# Patient Record
Sex: Male | Born: 1935 | Race: White | Hispanic: No | Marital: Married | Smoking: Former smoker
Health system: Southern US, Community
[De-identification: ages and names within clinical notes are randomized; demographics above are authoritative.]

## PROBLEM LIST (undated history)

## (undated) DIAGNOSIS — G8929 Other chronic pain: Secondary | ICD-10-CM

## (undated) DIAGNOSIS — K449 Diaphragmatic hernia without obstruction or gangrene: Secondary | ICD-10-CM

## (undated) DIAGNOSIS — I2510 Atherosclerotic heart disease of native coronary artery without angina pectoris: Secondary | ICD-10-CM

## (undated) DIAGNOSIS — K5900 Constipation, unspecified: Secondary | ICD-10-CM

## (undated) DIAGNOSIS — R001 Bradycardia, unspecified: Secondary | ICD-10-CM

## (undated) DIAGNOSIS — M810 Age-related osteoporosis without current pathological fracture: Secondary | ICD-10-CM

## (undated) DIAGNOSIS — M109 Gout, unspecified: Secondary | ICD-10-CM

## (undated) DIAGNOSIS — T8859XA Other complications of anesthesia, initial encounter: Secondary | ICD-10-CM

## (undated) DIAGNOSIS — IMO0001 Reserved for inherently not codable concepts without codable children: Secondary | ICD-10-CM

## (undated) DIAGNOSIS — Z9889 Other specified postprocedural states: Secondary | ICD-10-CM

## (undated) DIAGNOSIS — Z9841 Cataract extraction status, right eye: Secondary | ICD-10-CM

## (undated) DIAGNOSIS — K5520 Angiodysplasia of colon without hemorrhage: Secondary | ICD-10-CM

## (undated) DIAGNOSIS — K635 Polyp of colon: Secondary | ICD-10-CM

## (undated) DIAGNOSIS — I779 Disorder of arteries and arterioles, unspecified: Secondary | ICD-10-CM

## (undated) DIAGNOSIS — I10 Essential (primary) hypertension: Secondary | ICD-10-CM

## (undated) DIAGNOSIS — R296 Repeated falls: Secondary | ICD-10-CM

## (undated) DIAGNOSIS — M4306 Spondylolysis, lumbar region: Secondary | ICD-10-CM

## (undated) DIAGNOSIS — D500 Iron deficiency anemia secondary to blood loss (chronic): Secondary | ICD-10-CM

## (undated) DIAGNOSIS — I6789 Other cerebrovascular disease: Secondary | ICD-10-CM

## (undated) DIAGNOSIS — I480 Paroxysmal atrial fibrillation: Secondary | ICD-10-CM

## (undated) DIAGNOSIS — M1990 Unspecified osteoarthritis, unspecified site: Secondary | ICD-10-CM

## (undated) DIAGNOSIS — K5790 Diverticulosis of intestine, part unspecified, without perforation or abscess without bleeding: Secondary | ICD-10-CM

## (undated) DIAGNOSIS — I442 Atrioventricular block, complete: Secondary | ICD-10-CM

## (undated) DIAGNOSIS — M419 Scoliosis, unspecified: Secondary | ICD-10-CM

## (undated) DIAGNOSIS — N184 Chronic kidney disease, stage 4 (severe): Secondary | ICD-10-CM

## (undated) DIAGNOSIS — M720 Palmar fascial fibromatosis [Dupuytren]: Secondary | ICD-10-CM

## (undated) DIAGNOSIS — I951 Orthostatic hypotension: Secondary | ICD-10-CM

## (undated) DIAGNOSIS — N411 Chronic prostatitis: Secondary | ICD-10-CM

## (undated) DIAGNOSIS — I700 Atherosclerosis of aorta: Secondary | ICD-10-CM

## (undated) DIAGNOSIS — Z9842 Cataract extraction status, left eye: Secondary | ICD-10-CM

## (undated) DIAGNOSIS — I739 Peripheral vascular disease, unspecified: Secondary | ICD-10-CM

## (undated) DIAGNOSIS — H5440 Blindness, one eye, unspecified eye: Secondary | ICD-10-CM

## (undated) DIAGNOSIS — R768 Other specified abnormal immunological findings in serum: Secondary | ICD-10-CM

## (undated) DIAGNOSIS — I5032 Chronic diastolic (congestive) heart failure: Secondary | ICD-10-CM

## (undated) DIAGNOSIS — M5135 Other intervertebral disc degeneration, thoracolumbar region: Secondary | ICD-10-CM

## (undated) DIAGNOSIS — S0300XA Dislocation of jaw, unspecified side, initial encounter: Secondary | ICD-10-CM

## (undated) DIAGNOSIS — G4700 Insomnia, unspecified: Secondary | ICD-10-CM

## (undated) DIAGNOSIS — D696 Thrombocytopenia, unspecified: Secondary | ICD-10-CM

## (undated) DIAGNOSIS — S22000A Wedge compression fracture of unspecified thoracic vertebra, initial encounter for closed fracture: Secondary | ICD-10-CM

## (undated) DIAGNOSIS — Z950 Presence of cardiac pacemaker: Secondary | ICD-10-CM

## (undated) DIAGNOSIS — K219 Gastro-esophageal reflux disease without esophagitis: Secondary | ICD-10-CM

## (undated) DIAGNOSIS — R7689 Other specified abnormal immunological findings in serum: Secondary | ICD-10-CM

## (undated) DIAGNOSIS — D1779 Benign lipomatous neoplasm of other sites: Secondary | ICD-10-CM

## (undated) DIAGNOSIS — R112 Nausea with vomiting, unspecified: Secondary | ICD-10-CM

## (undated) DIAGNOSIS — T4145XA Adverse effect of unspecified anesthetic, initial encounter: Secondary | ICD-10-CM

## (undated) DIAGNOSIS — E119 Type 2 diabetes mellitus without complications: Secondary | ICD-10-CM

## (undated) DIAGNOSIS — R0609 Other forms of dyspnea: Secondary | ICD-10-CM

## (undated) DIAGNOSIS — K4090 Unilateral inguinal hernia, without obstruction or gangrene, not specified as recurrent: Secondary | ICD-10-CM

## (undated) DIAGNOSIS — I6529 Occlusion and stenosis of unspecified carotid artery: Secondary | ICD-10-CM

## (undated) DIAGNOSIS — I639 Cerebral infarction, unspecified: Secondary | ICD-10-CM

## (undated) DIAGNOSIS — D649 Anemia, unspecified: Secondary | ICD-10-CM

## (undated) DIAGNOSIS — E785 Hyperlipidemia, unspecified: Secondary | ICD-10-CM

## (undated) DIAGNOSIS — I219 Acute myocardial infarction, unspecified: Secondary | ICD-10-CM

## (undated) HISTORY — DX: Type 2 diabetes mellitus without complications: E11.9

## (undated) HISTORY — DX: Polyp of colon: K63.5

## (undated) HISTORY — DX: Scoliosis, unspecified: M41.9

## (undated) HISTORY — DX: Benign lipomatous neoplasm of other sites: D17.79

## (undated) HISTORY — DX: Diverticulosis of intestine, part unspecified, without perforation or abscess without bleeding: K57.90

## (undated) HISTORY — DX: Occlusion and stenosis of unspecified carotid artery: I65.29

## (undated) HISTORY — PX: TOTAL KNEE ARTHROPLASTY: SHX125

## (undated) HISTORY — DX: Blindness, one eye, unspecified eye: H54.40

## (undated) HISTORY — DX: Atherosclerosis of aorta: I70.0

## (undated) HISTORY — DX: Gout, unspecified: M10.9

## (undated) HISTORY — DX: Other intervertebral disc degeneration, thoracolumbar region: M51.35

## (undated) HISTORY — DX: Anemia, unspecified: D64.9

## (undated) HISTORY — DX: Diaphragmatic hernia without obstruction or gangrene: K44.9

## (undated) HISTORY — PX: EYE SURGERY: SHX253

## (undated) HISTORY — DX: Atherosclerotic heart disease of native coronary artery without angina pectoris: I25.10

## (undated) HISTORY — DX: Orthostatic hypotension: I95.1

## (undated) HISTORY — DX: Other cerebrovascular disease: I67.89

## (undated) HISTORY — PX: TONSILLECTOMY: SUR1361

## (undated) HISTORY — DX: Other specified abnormal immunological findings in serum: R76.89

## (undated) HISTORY — DX: Spondylolysis, lumbar region: M43.06

## (undated) HISTORY — DX: Chronic diastolic (congestive) heart failure: I50.32

## (undated) HISTORY — DX: Hyperlipidemia, unspecified: E78.5

## (undated) HISTORY — DX: Cataract extraction status, right eye: Z98.41

## (undated) HISTORY — PX: CATARACT EXTRACTION W/ INTRAOCULAR LENS IMPLANT: SHX1309

## (undated) HISTORY — DX: Bradycardia, unspecified: R00.1

## (undated) HISTORY — DX: Palmar fascial fibromatosis (dupuytren): M72.0

## (undated) HISTORY — DX: Chronic kidney disease, stage 4 (severe): N18.4

## (undated) HISTORY — DX: Gastro-esophageal reflux disease without esophagitis: K21.9

## (undated) HISTORY — DX: Unspecified osteoarthritis, unspecified site: M19.90

## (undated) HISTORY — DX: Other specified abnormal immunological findings in serum: R76.8

## (undated) HISTORY — DX: Wedge compression fracture of unspecified thoracic vertebra, initial encounter for closed fracture: S22.000A

## (undated) HISTORY — DX: Paroxysmal atrial fibrillation: I48.0

## (undated) HISTORY — DX: Unilateral inguinal hernia, without obstruction or gangrene, not specified as recurrent: K40.90

## (undated) HISTORY — DX: Essential (primary) hypertension: I10

## (undated) HISTORY — PX: CHOLECYSTECTOMY: SHX55

## (undated) HISTORY — DX: Cataract extraction status, left eye: Z98.42

## (undated) HISTORY — DX: Acute myocardial infarction, unspecified: I21.9

## (undated) HISTORY — PX: JOINT REPLACEMENT: SHX530

---

## 1974-05-25 DIAGNOSIS — I219 Acute myocardial infarction, unspecified: Secondary | ICD-10-CM

## 1974-05-25 HISTORY — DX: Acute myocardial infarction, unspecified: I21.9

## 1987-05-26 DIAGNOSIS — Z951 Presence of aortocoronary bypass graft: Secondary | ICD-10-CM

## 1987-05-26 HISTORY — PX: CORONARY ARTERY BYPASS GRAFT: SHX141

## 1987-05-26 HISTORY — DX: Presence of aortocoronary bypass graft: Z95.1

## 1996-01-12 ENCOUNTER — Encounter: Payer: Self-pay | Admitting: Gastroenterology

## 2000-09-14 ENCOUNTER — Encounter: Admission: RE | Admit: 2000-09-14 | Discharge: 2000-12-13 | Payer: Self-pay | Admitting: Internal Medicine

## 2000-10-13 ENCOUNTER — Encounter: Admission: RE | Admit: 2000-10-13 | Discharge: 2000-10-13 | Payer: Self-pay | Admitting: Internal Medicine

## 2000-10-13 ENCOUNTER — Encounter: Payer: Self-pay | Admitting: Internal Medicine

## 2001-12-20 ENCOUNTER — Encounter: Payer: Self-pay | Admitting: Internal Medicine

## 2001-12-20 ENCOUNTER — Encounter: Admission: RE | Admit: 2001-12-20 | Discharge: 2001-12-20 | Payer: Self-pay | Admitting: Internal Medicine

## 2002-02-17 ENCOUNTER — Encounter: Payer: Self-pay | Admitting: Gastroenterology

## 2002-02-17 LAB — HM COLONOSCOPY

## 2004-06-03 ENCOUNTER — Ambulatory Visit: Payer: Self-pay | Admitting: Internal Medicine

## 2004-06-11 ENCOUNTER — Ambulatory Visit: Payer: Self-pay | Admitting: Internal Medicine

## 2004-08-21 ENCOUNTER — Inpatient Hospital Stay (HOSPITAL_COMMUNITY): Admission: RE | Admit: 2004-08-21 | Discharge: 2004-08-25 | Payer: Self-pay | Admitting: Orthopedic Surgery

## 2005-02-10 ENCOUNTER — Ambulatory Visit: Payer: Self-pay | Admitting: Internal Medicine

## 2005-02-17 ENCOUNTER — Ambulatory Visit: Payer: Self-pay | Admitting: Internal Medicine

## 2005-03-02 ENCOUNTER — Ambulatory Visit (HOSPITAL_COMMUNITY): Admission: RE | Admit: 2005-03-02 | Discharge: 2005-03-02 | Payer: Self-pay | Admitting: Internal Medicine

## 2005-05-05 ENCOUNTER — Ambulatory Visit: Payer: Self-pay | Admitting: Internal Medicine

## 2005-08-03 ENCOUNTER — Ambulatory Visit: Payer: Self-pay | Admitting: Internal Medicine

## 2006-01-28 ENCOUNTER — Ambulatory Visit: Payer: Self-pay | Admitting: Internal Medicine

## 2006-02-18 ENCOUNTER — Ambulatory Visit: Payer: Self-pay | Admitting: Internal Medicine

## 2006-04-28 ENCOUNTER — Ambulatory Visit: Payer: Self-pay | Admitting: Internal Medicine

## 2006-04-28 LAB — CONVERTED CEMR LAB
ALT: 28 units/L (ref 0–40)
Albumin: 4 g/dL (ref 3.5–5.2)
Alkaline Phosphatase: 57 units/L (ref 39–117)
BUN: 19 mg/dL (ref 6–23)
CO2: 28 meq/L (ref 19–32)
Calcium: 9.8 mg/dL (ref 8.4–10.5)
Cholesterol: 154 mg/dL (ref 0–200)
Creatinine, Ser: 1.4 mg/dL (ref 0.4–1.5)
HDL: 47.2 mg/dL (ref >39.0)
Hgb A1c MFr Bld: 6.8 % — ABNORMAL HIGH (ref 4.6–6.0)
Sodium: 144 meq/L (ref 135–145)
VLDL: 12 mg/dL (ref 0–40)

## 2006-05-10 ENCOUNTER — Ambulatory Visit: Payer: Self-pay | Admitting: Internal Medicine

## 2006-08-24 ENCOUNTER — Ambulatory Visit: Payer: Self-pay | Admitting: Internal Medicine

## 2006-08-31 ENCOUNTER — Ambulatory Visit: Payer: Self-pay | Admitting: Internal Medicine

## 2006-08-31 LAB — CONVERTED CEMR LAB: BUN: 16 mg/dL (ref 6–23)

## 2006-11-15 DIAGNOSIS — M1990 Unspecified osteoarthritis, unspecified site: Secondary | ICD-10-CM

## 2006-11-15 DIAGNOSIS — E785 Hyperlipidemia, unspecified: Secondary | ICD-10-CM

## 2006-11-15 DIAGNOSIS — I10 Essential (primary) hypertension: Secondary | ICD-10-CM | POA: Insufficient documentation

## 2006-11-15 DIAGNOSIS — K219 Gastro-esophageal reflux disease without esophagitis: Secondary | ICD-10-CM | POA: Insufficient documentation

## 2006-12-13 ENCOUNTER — Ambulatory Visit: Payer: Self-pay | Admitting: Internal Medicine

## 2006-12-13 LAB — CONVERTED CEMR LAB
ALT: 20 units/L (ref 0–53)
AST: 20 units/L (ref 0–37)
Albumin: 4.3 g/dL (ref 3.5–5.2)
Cholesterol: 196 mg/dL (ref 0–200)
HDL: 43.3 mg/dL (ref >39.0)
Hgb A1c MFr Bld: 6.6 % — ABNORMAL HIGH (ref 4.6–6.0)
LDL Cholesterol: 119 mg/dL — ABNORMAL HIGH (ref 0–99)
Total CHOL/HDL Ratio: 4.5

## 2007-03-16 ENCOUNTER — Ambulatory Visit: Payer: Self-pay | Admitting: Internal Medicine

## 2007-03-16 DIAGNOSIS — T887XXA Unspecified adverse effect of drug or medicament, initial encounter: Secondary | ICD-10-CM | POA: Insufficient documentation

## 2007-03-16 LAB — CONVERTED CEMR LAB
Basophils Relative: 0.6 % (ref 0.0–1.0)
CO2: 29 meq/L (ref 19–32)
Calcium: 9.9 mg/dL (ref 8.4–10.5)
Chloride: 109 meq/L (ref 96–112)
Cholesterol, target level: 200 mg/dL
Creatinine, Ser: 1.2 mg/dL (ref 0.4–1.5)
Direct LDL: 137.3 mg/dL
Eosinophils Absolute: 0.1 10*3/uL (ref 0.0–0.6)
GFR calc non Af Amer: 62 mL/min
Glucose, Bld: 120 mg/dL — ABNORMAL HIGH (ref 70–99)
HCT: 43.1 % (ref 39.0–52.0)
Hemoglobin: 14.9 g/dL (ref 13.0–17.0)
LDL Goal: 100 mg/dL
Neutro Abs: 3.8 10*3/uL (ref 1.4–7.7)
Neutrophils Relative %: 62.5 % (ref 43.0–77.0)
Platelets: 174 10*3/uL (ref 150–400)
RBC: 4.56 M/uL (ref 4.22–5.81)
Total CHOL/HDL Ratio: 4.6
VLDL: 26 mg/dL (ref 0–40)
WBC: 6 10*3/uL (ref 4.5–10.5)

## 2007-07-21 ENCOUNTER — Ambulatory Visit: Payer: Self-pay | Admitting: Internal Medicine

## 2007-07-21 LAB — CONVERTED CEMR LAB
ALT: 22 units/L (ref 0–53)
Albumin: 4.2 g/dL (ref 3.5–5.2)
Alkaline Phosphatase: 66 units/L (ref 39–117)
BUN: 27 mg/dL — ABNORMAL HIGH (ref 6–23)
Bilirubin, Direct: 0.2 mg/dL (ref 0.0–0.3)
CO2: 30 meq/L (ref 19–32)
Calcium: 9.9 mg/dL (ref 8.4–10.5)
Chloride: 107 meq/L (ref 96–112)
Glucose, Bld: 111 mg/dL — ABNORMAL HIGH (ref 70–99)
Hgb A1c MFr Bld: 6.5 % — ABNORMAL HIGH (ref 4.6–6.0)
Potassium: 5 meq/L (ref 3.5–5.1)
Sodium: 142 meq/L (ref 135–145)
VLDL: 23 mg/dL (ref 0–40)

## 2008-01-20 ENCOUNTER — Ambulatory Visit: Payer: Self-pay | Admitting: Internal Medicine

## 2008-07-26 ENCOUNTER — Ambulatory Visit: Payer: Self-pay | Admitting: Internal Medicine

## 2008-07-26 DIAGNOSIS — N183 Chronic kidney disease, stage 3 (moderate): Secondary | ICD-10-CM

## 2008-07-26 DIAGNOSIS — N411 Chronic prostatitis: Secondary | ICD-10-CM

## 2008-07-26 LAB — CONVERTED CEMR LAB
ALT: 23 units/L (ref 0–53)
AST: 25 units/L (ref 0–37)
Alkaline Phosphatase: 70 units/L (ref 39–117)
Basophils Relative: 0.4 % (ref 0.0–3.0)
Bilirubin, Direct: 0.1 mg/dL (ref 0.0–0.3)
Chloride: 106 meq/L (ref 96–112)
Cholesterol: 181 mg/dL (ref 0–200)
Eosinophils Relative: 1.5 % (ref 0.0–5.0)
Hgb A1c MFr Bld: 6.7 % — ABNORMAL HIGH (ref 4.6–6.0)
Lymphocytes Relative: 23.4 % (ref 12.0–46.0)
MCHC: 35.2 g/dL (ref 30.0–36.0)
PSA: 0.44 ng/mL (ref 0.10–4.00)
Platelets: 179 10*3/uL (ref 150–400)
RDW: 12.7 % (ref 11.5–14.6)
WBC: 6.9 10*3/uL (ref 4.5–10.5)

## 2009-01-01 ENCOUNTER — Encounter (INDEPENDENT_AMBULATORY_CARE_PROVIDER_SITE_OTHER): Payer: Self-pay | Admitting: *Deleted

## 2009-02-04 ENCOUNTER — Ambulatory Visit: Payer: Self-pay | Admitting: Internal Medicine

## 2009-02-04 LAB — CONVERTED CEMR LAB
Albumin: 3.8 g/dL (ref 3.5–5.2)
Alkaline Phosphatase: 54 units/L (ref 39–117)
Bilirubin, Direct: 0.1 mg/dL (ref 0.0–0.3)
Calcium: 9.3 mg/dL (ref 8.4–10.5)
Creatinine, Ser: 1.3 mg/dL (ref 0.4–1.5)
GFR calc non Af Amer: 56.48 mL/min (ref >60)
HDL: 42.7 mg/dL (ref >39.00)
Total Protein: 6.4 g/dL (ref 6.0–8.3)
VLDL: 30.6 mg/dL (ref 0.0–40.0)

## 2009-02-11 ENCOUNTER — Ambulatory Visit: Payer: Self-pay | Admitting: Internal Medicine

## 2009-06-14 ENCOUNTER — Telehealth: Payer: Self-pay | Admitting: Gastroenterology

## 2009-06-14 ENCOUNTER — Encounter: Payer: Self-pay | Admitting: Gastroenterology

## 2009-07-11 ENCOUNTER — Ambulatory Visit: Payer: Self-pay | Admitting: Gastroenterology

## 2009-07-31 ENCOUNTER — Telehealth: Payer: Self-pay | Admitting: Internal Medicine

## 2009-07-31 ENCOUNTER — Ambulatory Visit: Payer: Self-pay | Admitting: Internal Medicine

## 2009-08-05 LAB — CONVERTED CEMR LAB
Basophils Absolute: 0.1 10*3/uL (ref 0.0–0.1)
Eosinophils Absolute: 0.2 10*3/uL (ref 0.0–0.7)
Eosinophils Relative: 2.6 % (ref 0.0–5.0)
HCT: 43.3 % (ref 39.0–52.0)
MCHC: 33.5 g/dL (ref 30.0–36.0)
Monocytes Relative: 6.4 % (ref 3.0–12.0)
Platelets: 237 10*3/uL (ref 150.0–400.0)
RBC: 4.6 M/uL (ref 4.22–5.81)

## 2009-09-16 ENCOUNTER — Ambulatory Visit: Payer: Self-pay | Admitting: Internal Medicine

## 2009-09-16 DIAGNOSIS — R5383 Other fatigue: Secondary | ICD-10-CM

## 2009-09-16 DIAGNOSIS — R5381 Other malaise: Secondary | ICD-10-CM | POA: Insufficient documentation

## 2009-09-16 DIAGNOSIS — R634 Abnormal weight loss: Secondary | ICD-10-CM

## 2009-09-16 LAB — CONVERTED CEMR LAB
Blood Glucose, Fingerstick: 366
Blood in Urine, dipstick: NEGATIVE
Glucose, Urine, Semiquant: >=1000
Specific Gravity, Urine: 1.01
WBC Urine, dipstick: NEGATIVE
pH: 5

## 2009-09-18 ENCOUNTER — Observation Stay (HOSPITAL_COMMUNITY): Admission: EM | Admit: 2009-09-18 | Discharge: 2009-09-20 | Payer: Self-pay | Admitting: Emergency Medicine

## 2009-09-18 ENCOUNTER — Ambulatory Visit: Payer: Self-pay | Admitting: Gastroenterology

## 2009-09-18 LAB — CONVERTED CEMR LAB
AST: 49 units/L — ABNORMAL HIGH (ref 0–37)
BUN: 38 mg/dL — ABNORMAL HIGH (ref 6–23)
Basophils Relative: 0.1 % (ref 0.0–3.0)
CO2: 27 meq/L (ref 19–32)
Eosinophils Relative: 0.5 % (ref 0.0–5.0)
Glucose, Bld: 359 mg/dL — ABNORMAL HIGH (ref 70–99)
Lymphocytes Relative: 7.6 % — ABNORMAL LOW (ref 12.0–46.0)
MCHC: 34.5 g/dL (ref 30.0–36.0)
MCV: 92.3 fL (ref 78.0–100.0)
Monocytes Absolute: 0.2 10*3/uL (ref 0.1–1.0)
Monocytes Relative: 1.3 % — ABNORMAL LOW (ref 3.0–12.0)
Neutro Abs: 12.5 10*3/uL — ABNORMAL HIGH (ref 1.4–7.7)
Neutrophils Relative %: 90.5 % — ABNORMAL HIGH (ref 43.0–77.0)
Platelets: 268 10*3/uL (ref 150.0–400.0)
Sodium: 142 meq/L (ref 135–145)
Total Bilirubin: 1.6 mg/dL — ABNORMAL HIGH (ref 0.3–1.2)
Total Protein: 6.6 g/dL (ref 6.0–8.3)

## 2009-09-19 ENCOUNTER — Telehealth: Payer: Self-pay | Admitting: Internal Medicine

## 2009-09-25 ENCOUNTER — Encounter (INDEPENDENT_AMBULATORY_CARE_PROVIDER_SITE_OTHER): Payer: Self-pay | Admitting: *Deleted

## 2009-09-30 ENCOUNTER — Telehealth: Payer: Self-pay | Admitting: Internal Medicine

## 2009-10-01 ENCOUNTER — Ambulatory Visit: Payer: Self-pay | Admitting: Internal Medicine

## 2009-10-01 DIAGNOSIS — R609 Edema, unspecified: Secondary | ICD-10-CM

## 2009-10-01 DIAGNOSIS — E1129 Type 2 diabetes mellitus with other diabetic kidney complication: Secondary | ICD-10-CM | POA: Insufficient documentation

## 2009-10-03 LAB — CONVERTED CEMR LAB
ALT: 26 units/L (ref 0–53)
AST: 24 units/L (ref 0–37)
Alkaline Phosphatase: 105 units/L (ref 39–117)
BUN: 18 mg/dL (ref 6–23)
Bilirubin, Direct: 0.2 mg/dL (ref 0.0–0.3)
Glucose, Bld: 88 mg/dL (ref 70–99)
Potassium: 4.9 meq/L (ref 3.5–5.1)
Total Bilirubin: 0.8 mg/dL (ref 0.3–1.2)

## 2009-10-04 ENCOUNTER — Encounter: Payer: Self-pay | Admitting: Internal Medicine

## 2009-10-04 LAB — CONVERTED CEMR LAB: Protein, Ur: 75 mg/24hr (ref 50–100)

## 2009-10-16 ENCOUNTER — Ambulatory Visit: Payer: Self-pay | Admitting: Internal Medicine

## 2009-11-22 ENCOUNTER — Ambulatory Visit: Payer: Self-pay | Admitting: Internal Medicine

## 2009-11-22 DIAGNOSIS — M26609 Unspecified temporomandibular joint disorder, unspecified side: Secondary | ICD-10-CM | POA: Insufficient documentation

## 2009-12-06 ENCOUNTER — Telehealth: Payer: Self-pay | Admitting: Internal Medicine

## 2009-12-10 ENCOUNTER — Ambulatory Visit: Payer: Self-pay | Admitting: Internal Medicine

## 2009-12-11 ENCOUNTER — Encounter: Payer: Self-pay | Admitting: Internal Medicine

## 2010-02-21 ENCOUNTER — Ambulatory Visit: Payer: Self-pay | Admitting: Internal Medicine

## 2010-02-21 DIAGNOSIS — K5732 Diverticulitis of large intestine without perforation or abscess without bleeding: Secondary | ICD-10-CM | POA: Insufficient documentation

## 2010-02-21 LAB — CONVERTED CEMR LAB
ALT: 23 units/L (ref 0–53)
AST: 23 units/L (ref 0–37)
Albumin: 4.2 g/dL (ref 3.5–5.2)
Alkaline Phosphatase: 61 units/L (ref 39–117)
Basophils Absolute: 0.1 10*3/uL (ref 0.0–0.1)
Bilirubin, Direct: 0.2 mg/dL (ref 0.0–0.3)
Eosinophils Absolute: 0.1 10*3/uL (ref 0.0–0.7)
HCT: 41.8 % (ref 39.0–52.0)
Hemoglobin: 14.5 g/dL (ref 13.0–17.0)
MCV: 92.3 fL (ref 78.0–100.0)
Monocytes Relative: 7.9 % (ref 3.0–12.0)
Neutro Abs: 4.3 10*3/uL (ref 1.4–7.7)
RBC: 4.53 M/uL (ref 4.22–5.81)
RDW: 13.9 % (ref 11.5–14.6)
Total Bilirubin: 1.2 mg/dL (ref 0.3–1.2)
Total Protein: 6.8 g/dL (ref 6.0–8.3)
WBC: 6.9 10*3/uL (ref 4.5–10.5)

## 2010-02-24 ENCOUNTER — Telehealth: Payer: Self-pay | Admitting: Internal Medicine

## 2010-02-24 ENCOUNTER — Ambulatory Visit: Payer: Self-pay | Admitting: Internal Medicine

## 2010-02-24 DIAGNOSIS — R079 Chest pain, unspecified: Secondary | ICD-10-CM

## 2010-03-14 ENCOUNTER — Ambulatory Visit: Payer: Self-pay | Admitting: Internal Medicine

## 2010-03-14 ENCOUNTER — Telehealth: Payer: Self-pay | Admitting: Internal Medicine

## 2010-03-18 ENCOUNTER — Encounter: Admission: RE | Admit: 2010-03-18 | Discharge: 2010-03-18 | Payer: Self-pay | Admitting: Internal Medicine

## 2010-04-22 ENCOUNTER — Ambulatory Visit: Payer: Self-pay | Admitting: Internal Medicine

## 2010-04-22 DIAGNOSIS — R6881 Early satiety: Secondary | ICD-10-CM

## 2010-04-22 LAB — CONVERTED CEMR LAB
BUN: 23 mg/dL (ref 6–23)
Calcium: 9.5 mg/dL (ref 8.4–10.5)
Eosinophils Absolute: 0.1 10*3/uL (ref 0.0–0.7)
Eosinophils Relative: 1.1 % (ref 0.0–5.0)
GFR calc non Af Amer: 58.37 mL/min (ref >60)
Hemoglobin: 14.9 g/dL (ref 13.0–17.0)
MCV: 93.7 fL (ref 78.0–100.0)
Microalb, Ur: 0.8 mg/dL (ref 0.0–1.9)
Monocytes Absolute: 0.5 10*3/uL (ref 0.1–1.0)
Neutro Abs: 4.3 10*3/uL (ref 1.4–7.7)
Neutrophils Relative %: 62.5 % (ref 43.0–77.0)
Platelets: 159 10*3/uL (ref 150.0–400.0)
Potassium: 4.3 meq/L (ref 3.5–5.1)
Sodium: 141 meq/L (ref 135–145)

## 2010-04-24 ENCOUNTER — Telehealth: Payer: Self-pay | Admitting: Internal Medicine

## 2010-04-25 ENCOUNTER — Ambulatory Visit: Payer: Self-pay | Admitting: Cardiovascular Disease

## 2010-04-25 DIAGNOSIS — IMO0002 Reserved for concepts with insufficient information to code with codable children: Secondary | ICD-10-CM | POA: Insufficient documentation

## 2010-05-02 ENCOUNTER — Telehealth: Payer: Self-pay | Admitting: Internal Medicine

## 2010-05-29 ENCOUNTER — Ambulatory Visit
Admission: RE | Admit: 2010-05-29 | Discharge: 2010-05-29 | Payer: Self-pay | Source: Home / Self Care | Attending: Internal Medicine | Admitting: Internal Medicine

## 2010-06-15 ENCOUNTER — Encounter: Payer: Self-pay | Admitting: Internal Medicine

## 2010-06-24 NOTE — Assessment & Plan Note (Signed)
Summary: pt will come in fasting/njr pt rsc/njr   Vital Signs:  Patient profile:   77 year old male Height:      71 inches Weight:      210 pounds BMI:     29.39 Temp:     98.2 degrees F oral Pulse rate:   76 / minute Resp:     14 per minute BP sitting:   156 / 76  (left arm)  Vitals Entered By: Willy Eddy, LPN (Oct 16, 2009 10:46 AM) CC: roa, Hypertension Management   Primary Care Provider:  Darryll Capers, MD   CC:  roa and Hypertension Management.  History of Present Illness: the blood sugars have been in the 100 range with the new medication will stop the avadia due to the edema and continue to totrate to optimal blood pressure control  Hypertension History:      He denies headache, chest pain, palpitations, dyspnea with exertion, orthopnea, PND, peripheral edema, visual symptoms, neurologic problems, syncope, and side effects from treatment.        Positive major cardiovascular risk factors include male age 64 years old or older, diabetes, hyperlipidemia, and hypertension.  Negative major cardiovascular risk factors include non-tobacco-user status.     Allergies: No Known Drug Allergies  Past History:  Family History: Last updated: 07/11/2009 Family History of CAD Male 1st degree relative <50 Family History Diabetes 1st degree relative No FH of Colon Cancer:  Social History: Last updated: 07/11/2009 Retired Married 3 childern Former Smoker in high school Alcohol Use - no Daily Caffeine Use: 1 daily  Illicit Drug Use - no  Risk Factors: Exercise: yes (03/16/2007)  Risk Factors: Smoking Status: quit (10/01/2009)  Past medical, surgical, family and social histories (including risk factors) reviewed, and no changes noted (except as noted below).  Past Medical History: Reviewed history from 07/11/2009 and no changes required. Diabetes mellitus, type II GERD Hyperlipidemia Hypertension Osteoarthritis Diverticulosis Hemorrhoids Hyperplastic  Colon Polyps  Past Surgical History: Reviewed history from 12/13/2006 and no changes required. Total knee replacement bilateral Coronary artery bypass graft x 5 1989 Cholecystectomy Tonsillectomy  Family History: Reviewed history from 07/11/2009 and no changes required. Family History of CAD Male 1st degree relative <50 Family History Diabetes 1st degree relative No FH of Colon Cancer:  Social History: Reviewed history from 07/11/2009 and no changes required. Retired Married 3 childern Former Smoker in high school Alcohol Use - no Daily Caffeine Use: 1 daily  Illicit Drug Use - no  Review of Systems  The patient denies anorexia, fever, weight loss, weight gain, vision loss, decreased hearing, hoarseness, chest pain, syncope, dyspnea on exertion, peripheral edema, prolonged cough, headaches, hemoptysis, abdominal pain, melena, hematochezia, severe indigestion/heartburn, hematuria, incontinence, genital sores, muscle weakness, suspicious skin lesions, transient blindness, difficulty walking, depression, unusual weight change, abnormal bleeding, enlarged lymph nodes, angioedema, and breast masses.    Physical Exam  General:  alert, well-developed, overweight-appearing, and pale.  not jaudiced Head:  Normocephalic and atraumatic without obvious abnormalities. No apparent alopecia or balding. Eyes:  No corneal or conjunctival inflammation noted. EOMI. Perrla. Funduscopic exam benign, without hemorrhages, exudates or papilledema. Vision grossly normal. Nose:  no external deformity and no nasal discharge.   Neck:  supple.   Lungs:  normal respiratory effort and no wheezes.   Heart:  normal rate and regular rhythm.   Abdomen:  soft, non-tender, and normal bowel sounds.   Msk:  normal ROM and no joint tenderness.   Extremities:  trace left  pedal edema and trace right pedal edema.   Neurologic:  alert & oriented X3 and finger-to-nose normal.     Impression &  Recommendations:  Problem # 1:  EDEMA (ICD-782.3) due to medicaitons and the blood sugar His updated medication list for this problem includes:    Furosemide 20 Mg Tabs (Furosemide) ..... One by mouth daily  Discussed elevation of the legs, use of compression stockings, sodium restiction, and medication use.   Problem # 2:  DIAB W/RENAL MANIFESTS TYPE II/UNS NOT UNCNTRL (ICD-250.40)  The following medications were removed from the medication list:    Avandia 8 Mg Tabs (Rosiglitazone maleate) .[name]... 1/2 once daily His updated medication list for this problem includes:    Aspirin 81 Mg Tabs (Aspirin) ..... One tablet by mouth once daily    Kombiglyze Xr 2.09-998 Mg Xr24h-tab (Saxagliptin-metformin) ..... One by mouth daily    Glipizide 10 Mg Xr24h-tab (Glipizide) ..... One daily    Benazepril Hcl 20 Mg Tabs (Benazepril hcl) .[name]..[name] Two by mouth daily  ( total of 40)    Benazepril Hcl 40 Mg Tabs (Benazepril hcl) ..... One by mouth daily  Labs Reviewed: Creat: 1.1 (10/01/2009)     Last Eye Exam: diabetic retinopathy (07/23/2008) Reviewed HgBA1c results: 7.0 (10/01/2009)  6.6 (02/11/2009)  Orders: Prescription Created Electronically [phone])  Problem # 3:  HYPERTENSION (ICD-401.9)  His updated medication list for this problem includes:    Metoprolol Tartrate 100 Mg Tabs (Metoprolol tartrate) ..... Once daily    Furosemide 20 Mg Tabs (Furosemide) ..... One by mouth daily    Benazepril Hcl 20 Mg Tabs (Benazepril hcl) .[name]..[name] Two by mouth daily  ( total of 40)    Benazepril Hcl 40 Mg Tabs (Benazepril hcl) ..... One by mouth daily  BP today: 156/76 Prior BP: 170/78 (10/01/2009)  Prior 10 Yr Risk Heart Disease: 33 % (10/01/2009)  Labs Reviewed: K+: 4.9 (10/01/2009) Creat: : 1.1 (10/01/2009)   Chol: 169 (02/04/2009)   HDL: 42.70 (02/04/2009)   LDL: 96 (02/04/2009)   TG: 153.0 (02/04/2009)  Complete Medication List: 1)  Aspirin 81 Mg Tabs (Aspirin) .... One tablet by mouth once  daily 2)  Kombiglyze Xr 2.09-998 Mg Xr24h-tab (Saxagliptin-metformin) .... One by mouth daily 3)  Metoprolol Tartrate 100 Mg Tabs (Metoprolol tartrate) .... Once daily 4)  Crestor 20 Mg Tabs (Rosuvastatin calcium) .... Once daily 5)  Onetouch Ultra Test Strp (Glucose blood) .... Use as directed before meals 6)  Glipizide 10 Mg Xr24h-tab (Glipizide) .... One daily 7)  Furosemide 20 Mg Tabs (Furosemide) .... One by mouth daily 8)  Benazepril Hcl 20 Mg Tabs (Benazepril hcl) .... Two by mouth daily  ( total of 40) 9)  Onetouch Ultra Test Strp (Glucose blood) .... Use two times a day as directed 10)  Onetouch Finepoint Lancets Misc (Lancets) .... Use as directed 11)  Benazepril Hcl 40 Mg Tabs (Benazepril hcl) .... One by mouth daily 12)  Clotrimazole-betamethasone 1-0.05 % Crea (Clotrimazole-betamethasone) .... Apply to rash two times a day  Hypertension Assessment/Plan:      The patient's hypertensive risk group is category C: Target organ damage and/or diabetes.  His calculated 10 year risk of coronary heart disease is 27 %.  Today's blood pressure is 156/76.  His blood pressure goal is < 130/80.  Patient Instructions: 1)  Stopping the avandia 2)  changing the metforming to kombiglyze 2.09/998 3)  continue the lasix 4)  the glipizide and 5)  we are doubling the benazepril to 40 (  two 20) 6)  I have sent in the benazepril, furosemide and the glipizide to [facility] 7)  Please schedule a follow-up appointment in 1 month. Prescriptions: CLOTRIMAZOLE-BETAMETHASONE 1-0.05 % CREA (CLOTRIMAZOLE-BETAMETHASONE) apply to rash two times a day  #30 x 1   Entered and Authorized by:   Stacie Glaze MD   Signed by:   Stacie Glaze MD on 10/16/2009   Method used:   Electronically to        [facility]* (retail)       [address]       [place], [place]  [address]       Ph: [phone]       Fax: [phone]   RxID:   [id number] FUROSEMIDE 20 MG TABS (FUROSEMIDE) one by mouth daily  #90 x 3    Entered and Authorized by:   Stacie Glaze MD   Signed by:   Stacie Glaze MD on 10/16/2009   Method used:   Faxed to ...       [facility] (mail-order)       [address] [place], [place]  [address]       Ph: [phone]       Fax: [phone]   RxID:   [id number] GLIPIZIDE 10 MG XR24H-TAB (GLIPIZIDE) one daily  #90 x 3   Entered and Authorized by:   Stacie Glaze MD   Signed by:   Stacie Glaze MD on 10/16/2009   Method used:   Faxed to ...       [facility] (mail-order)       [address] [place], [place]  [address]       Ph: [phone]       Fax: [phone]   RxID:   [id number] BENAZEPRIL HCL 40 MG TABS (BENAZEPRIL HCL) ONE by mouth DAILY  #90 x 3   Entered and Authorized by:   Stacie Glaze MD   Signed by:   Stacie Glaze MD on 10/16/2009   Method used:   Faxed to ...       [facility] (mail-order)       [address] [place], [place]  [address]       Ph: [phone]       Fax: [phone]   RxID:   [id number]

## 2010-06-24 NOTE — Procedures (Signed)
Summary: [employer]   Imported By: Sherian Rein 07/11/2009 13:54:41  _____________________________________________________________________  External Attachment:    Type:   Image     Comment:   External Document

## 2010-06-24 NOTE — Assessment & Plan Note (Signed)
Summary: cont,. to c/o abd and back pain/bmw   Vital Signs:  Patient profile:   77 year old male Height:      71 inches Weight:      206 pounds BMI:     28.84 Temp:     98.2 degrees F oral Pulse rate:   76 / minute Resp:     14 per minute BP sitting:   136 / 78  (left arm)  Vitals Entered By: Willy Eddy, LPN (March 14, 2010 10:19 AM) CC: saw dr Lovell Sheehan 9-30 with left upper side pain radiating down to abd- xrays neg--completed flagyl and cipro for this, but continues to c/o pain Is Patient Diabetic? Yes Did you bring your meter with you today? No   Primary Care Provider:  Darryll Capers, MD   CC:  saw dr Lovell Sheehan 9-30 with left upper side pain radiating down to abd- xrays neg--completed flagyl and cipro for this and but continues to c/o pain.  History of Present Illness: 75 -year-old male, who now has a 4 to 5 week history of on his left lateral chest wall region.  A chest x-ray and rib series have been negative.  He describes local tenderness and pain with deep inspiration and movement.  No shortness of breath.  He does have a history of lung disease, diagnosed and treated this past summer.  He has hypertension and type 2 diabetes as well as chronic kidney disease.  Preventive Screening-Counseling & Management  Alcohol-Tobacco     Smoking Status: quit     Tobacco Counseling: not indicated; no tobacco use  Current Problems (verified): 1)  Rib Pain, Left Sided  (ICD-786.50) 2)  Abdominal Pain, Left Lower Quadrant  (ICD-789.04) 3)  Diverticulitis of Colon  (ICD-562.11) 4)  Temporomandibular Joint Disorder  (ICD-524.60) 5)  Unspecified Hepatitis  (ICD-573.3) 6)  Diab W/renal Manifests Type Ii/uns Not Uncntrl  (ICD-250.40) 7)  Edema  (ICD-782.3) 8)  Weight Loss  (ICD-783.21) 9)  Urticaria  (ICD-708.9) 10)  Fatigue  (ICD-780.79) 11)  Diverticulosis-colon  (ICD-562.10) 12)  Screening Colorectal-cancer  (ICD-V76.51) 13)  Chronic Kidney Disease Stage II (MILD)   (ICD-585.2) 14)  Chronic Prostatitis  (ICD-601.1) 15)  Advef, Drug/medicinal/biological Subst Nos  (ICD-995.20) 16)  Family History Diabetes 1st Degree Relative  (ICD-V18.0) 17)  Family History of Cad Male 1st Degree Relative <50  (ICD-V17.3) 18)  Osteoarthritis  (ICD-715.90) 19)  Hypertension  (ICD-401.9) 20)  Hyperlipidemia  (ICD-272.4) 21)  Gerd  (ICD-530.81)  Current Medications (verified): 1)  Aspirin 81 Mg  Tabs (Aspirin) .... One Tablet By Mouth Once Daily 2)  Kombiglyze Xr 2.09-998 Mg Xr24h-Tab (Saxagliptin-Metformin) .... One By Mouth Daily 3)  Metoprolol Tartrate 100 Mg  Tabs (Metoprolol Tartrate) .... Once Daily 4)  Crestor 20 Mg  Tabs (Rosuvastatin Calcium) .... Once Daily---Hold 5)  Glipizide 10 Mg Xr24h-Tab (Glipizide) .... One Daily 6)  Furosemide 20 Mg Tabs (Furosemide) .... One By Mouth Daily 7)  Onetouch Ultra Test  Strp (Glucose Blood) .... Use Two Times A Day As Directed 8)  Onetouch Finepoint Lancets  Misc (Lancets) .... Use As Directed 9)  Benazepril Hcl 40 Mg Tabs (Benazepril Hcl) .... One By Mouth Daily 10)  Clotrimazole-Betamethasone 1-0.05 % Crea (Clotrimazole-Betamethasone) .... Apply To Rash Two Times A Day  Allergies (verified): No Known Drug Allergies  Past History:  Past Medical History: Reviewed history from 07/11/2009 and no changes required. Diabetes mellitus, type II GERD Hyperlipidemia Hypertension Osteoarthritis Diverticulosis Hemorrhoids Hyperplastic Colon Polyps  Review  of Systems       The patient complains of chest pain.  The patient denies anorexia, fever, weight loss, weight gain, vision loss, decreased hearing, hoarseness, syncope, dyspnea on exertion, peripheral edema, prolonged cough, headaches, hemoptysis, abdominal pain, melena, hematochezia, severe indigestion/heartburn, hematuria, incontinence, genital sores, muscle weakness, suspicious skin lesions, transient blindness, difficulty walking, depression, unusual weight change,  abnormal bleeding, enlarged lymph nodes, angioedema, breast masses, and testicular masses.    Physical Exam  General:  overweight-appearing.  overweight-appearing.   Head:  Normocephalic and atraumatic without obvious abnormalities. No apparent alopecia or balding. Mouth:  Oral mucosa and oropharynx without lesions or exudates.  Teeth in good repair. Neck:  No deformities, masses, or tenderness noted. Chest Wall:  the patient had point tenderness over the left costal margin Lungs:  Normal respiratory effort, chest expands symmetrically. Lungs are clear to auscultation, no crackles or wheezes. Heart:  Normal rate and regular rhythm. S1 and S2 normal without gallop, murmur, click, rub or other extra sounds. Abdomen:  Bowel sounds positive,abdomen soft and non-tender without masses, organomegaly or hernias noted.   Impression & Recommendations:  Problem # 1:  RIB PAIN, LEFT SIDED (ICD-786.50) is clearly appears to be a chest wall is here.  Radiographs have been negative.  He is very concerned about intra-abdominal pathology.  Will obtain an abdominal ultrasound;  due to his chronic kidney disease, will hold anti-inflammatory medications at this time  Problem # 2:  ABDOMINAL PAIN, LEFT LOWER QUADRANT (ICD-789.04)  Complete Medication List: 1)  Aspirin 81 Mg Tabs (Aspirin) .... One tablet by mouth once daily 2)  Kombiglyze Xr 2.09-998 Mg Xr24h-tab (Saxagliptin-metformin) .... One by mouth daily 3)  Metoprolol Tartrate 100 Mg Tabs (Metoprolol tartrate) .... Once daily 4)  Crestor 20 Mg Tabs (Rosuvastatin calcium) .... Once daily---hold 5)  Glipizide 10 Mg Xr24h-tab (Glipizide) .... One daily 6)  Furosemide 20 Mg Tabs (Furosemide) .... One by mouth daily 7)  Onetouch Ultra Test Strp (Glucose blood) .... Use two times a day as directed 8)  Onetouch Finepoint Lancets Misc (Lancets) .... Use as directed 9)  Benazepril Hcl 40 Mg Tabs (Benazepril hcl) .... One by mouth daily 10)   Clotrimazole-betamethasone 1-0.05 % Crea (Clotrimazole-betamethasone) .... Apply to rash two times a day 11)  Tramadol Hcl 50 Mg Tabs (Tramadol hcl) .... One every 6 hours as needed for pain  Other Orders: Radiology Referral (Radiology)  Patient Instructions: 1)  abdominal ultrasound as scheduled 2)  take pain medications as prescribed 3)  Please schedule a follow-up appointment as needed. Prescriptions: TRAMADOL HCL 50 MG TABS (TRAMADOL HCL) one every 6 hours as needed for pain  #50 x 4   Entered and Authorized by:   Gordy Savers  MD   Signed by:   Gordy Savers  MD on 03/14/2010   Method used:   Print then Give to Patient   RxID:   [id number]    Orders Added: 1)  Est. Patient Level III [[address]782] 2)  Radiology Referral [Radiology]

## 2010-06-24 NOTE — Progress Notes (Signed)
Summary: rash no better  Phone Note Call from Patient Call back at Home Phone [phone]   Caller: Patient Summary of Call: Rash no better and maybe worse after 14 days Diflucan, what's next? Initial call taken by: Raechel Ache, RN,  December 06, 2009 12:16 PM  Follow-up for Phone Call        referr to [facility] Follow-up by: Stacie Glaze MD,  December 06, 2009 5:01 PM     Appended Document: Orders Update    Clinical Lists Changes  Orders: Added new Test order of TLB-Sedimentation Rate (ESR) (85652-ESR) - Signed Added new Test order of T-Lyme Disease [phone]) - Signed

## 2010-06-24 NOTE — Letter (Signed)
Summary: Appt Reminder 2  [facility]  [address] [place], [place] [address]   Phone: [phone]  Fax: [phone]        Sep 25, 2009 MRN: [id number]    Dean Murphy [address] [place], [place]  [address]    Dear [name],   You have a return appointment with Dr. Russella Dar on Thursday 11-14-09 at 2:45pm.  Please remember to bring a complete list of the medicines you are taking, your insurance card and your co-pay.  If you have to cancel or reschedule this appointment, please call before 5:00 pm the evening before to avoid a cancellation fee.  If you have any questions or concerns, please call [phone].    Sincerely,  [employer]

## 2010-06-24 NOTE — Assessment & Plan Note (Signed)
Summary: discuss having colonoscopy age 77/dn   History of Present Illness Visit Type: new patient  Primary GI MD: Elie Goody MD [facility] Primary Provider: Darryll Capers, MD  Requesting Provider: n/a Chief Complaint: GERD. Colon cancer screening. Pt denies any GI complaints  History of Present Illness:   Dean Murphy is an 77 year old white male who relates rare episodes of heartburn that are controlled with the use of times. He has had hyperplastic polyps noted on prior colonoscopies but has not had adenomatous polyps. His last colonoscopy was performed and 2003, which showed diverticulosis, hemorrhoids, and a hyperplastic colon polyp. He has no colorectal complaints.   GI Review of Systems    Reports acid reflux.      Denies abdominal pain, belching, bloating, chest pain, dysphagia with liquids, dysphagia with solids, heartburn, loss of appetite, nausea, vomiting, vomiting blood, weight loss, and  weight gain.        Denies anal fissure, black tarry stools, change in bowel habit, constipation, diarrhea, diverticulosis, fecal incontinence, heme positive stool, hemorrhoids, irritable bowel syndrome, jaundice, light color stool, liver problems, rectal bleeding, and  rectal pain.   Current Medications (verified): 1)  Aspirin 81 Mg  Tabs (Aspirin) .... One Tablet By Mouth Once Daily 2)  Avandia 8 Mg Tabs (Rosiglitazone Maleate) .... 1/2 Once Daily 3)  Lotrel 5-20 Mg Caps (Amlodipine Besy-Benazepril Hcl) .... Once Daily 4)  Metformin Hcl 500 Mg Tb24 (Metformin Hcl) .... Once Daily 5)  Nabumetone 750 Mg Tabs (Nabumetone) .... Take 1 Tablet By Mouth Twice A Day 6)  Metoprolol Tartrate 100 Mg  Tabs (Metoprolol Tartrate) .... Once Daily 7)  Crestor 20 Mg  Tabs (Rosuvastatin Calcium) .... Once Daily 8)  Onetouch Ultra Test   Strp (Glucose Blood) .... Use As Directed Before Meals  Allergies (verified): No Known Drug Allergies  Past History:  Past Medical History: Diabetes mellitus, type  II GERD Hyperlipidemia Hypertension Osteoarthritis Diverticulosis Hemorrhoids Hyperplastic Colon Polyps  Past Surgical History: Reviewed history from 12/13/2006 and no changes required. Total knee replacement bilateral Coronary artery bypass graft x 5 1989 Cholecystectomy Tonsillectomy  Family History: Family History of CAD Male 1st degree relative <50 Family History Diabetes 1st degree relative No FH of Colon Cancer:  Social History: Retired Married 3 childern Former Smoker in high school Alcohol Use - no Daily Caffeine Use: 1 daily  Illicit Drug Use - no Drug Use:  no  Review of Systems       The pertinent positives and negatives are noted as above and in the HPI. All other ROS were reviewed and were negative.   Vital Signs:  Patient profile:   77 year old male Height:      71 inches Weight:      226 pounds BMI:     31.63 BSA:     2.22 Pulse rate:   64 / minute Pulse rhythm:   regular BP sitting:   126 / 74  (left arm) Cuff size:   regular  Vitals Entered By: Ok Anis CMA (July 11, 2009 8:50 AM)  Physical Exam  General:  Well developed, well nourished, no acute distress. Head:  Normocephalic and atraumatic. Eyes:  PERRLA, no icterus. Mouth:  No deformity or lesions, dentition normal. Lungs:  Clear throughout to auscultation. Heart:  Regular rate and rhythm; no murmurs, rubs,  or bruits. Abdomen:  Soft, nontender and nondistended. No masses, hepatosplenomegaly or hernias noted. Normal bowel sounds. Psych:  Alert and cooperative. Normal mood and affect.  Impression &  Recommendations:  Problem # 1:  GERD (ICD-530.81) Mild GERD with infrequent episodes of heartburn. Continue p.r.n. TUMS and standard antireflux measures.  Problem # 2:  SCREENING COLORECTAL-CANCER (ICD-V76.51) Prior history of hyperplastic colon polyps. Last colonoscopy in 01/2002. He due for CRC screening in September 2013 but feels he does not want to have another colonoscopy. I  think this decision is reasonable given his age and the fact that he has not had adenomatous polyps or a family history of colon cancer.  Problem # 3:  DIVERTICULOSIS-COLON (ICD-562.10) Long-term high fiber diet with adequate fluid intake.  Patient Instructions: 1)  Please continue current medications.  2)  Please schedule a follow-up appointment as needed.  3)  The medication list was reviewed and reconciled.  All changed / newly prescribed medications were explained.  A complete medication list was provided to the patient / caregiver.

## 2010-06-24 NOTE — Progress Notes (Signed)
Summary: FYI-requesting ct results  Phone Note Call from Patient   Summary of Call: pt call and states the pain is much im proved ,but some pain is still there. requesting results from ct Initial call taken by: Willy Eddy, LPN,  May 02, 2010 10:06 AM  Follow-up for Phone Call        evidence  diverticulosis  and some arthritis in the back... Follow-up by: Stacie Glaze MD,  May 02, 2010 1:42 PM  Additional Follow-up for Phone Call Additional follow up Details #1::        pt informed Additional Follow-up by: Willy Eddy, LPN,  May 02, 2010 3:50 PM

## 2010-06-24 NOTE — Progress Notes (Signed)
Summary: Schedule Office Visit  Phone Note Outgoing Call   Call placed by: Hortense Ramal CMA Duncan Dull),  June 14, 2009 2:03 PM Call placed to: Patient Summary of Call: I have spoken to patient and have advised him that it is time for his colonoscopy. However, due to his age he will need an office visit with Dr Russella Dar first to discuss. Patient has scheduled an office visit. Initial call taken by: Hortense Ramal CMA Duncan Dull),  June 14, 2009 2:07 PM

## 2010-06-24 NOTE — Progress Notes (Signed)
  Phone Note Call from Patient   Caller: Patient Call For: Stacie Glaze MD Summary of Call: Updated......Marland KitchenRibs (left) are no better, but abdomen feels better.  Would like an xray of rib, please.  Chest plus left rib details ordered per Dr. Lovell Sheehan. Initial call taken by: Lynann Beaver CMA,  February 24, 2010 9:48 AM  Follow-up for Phone Call        Pt. notified. Follow-up by: Lynann Beaver CMA,  February 24, 2010 9:52 AM  New Problems: RIB PAIN, LEFT SIDED (ICD-786.50)   New Problems: RIB PAIN, LEFT SIDED (ICD-786.50)  Appended Document: Orders Update    Clinical Lists Changes  Orders: Added new Test order of T-Ribs Unilateral 2 Views (71100TC) - Signed

## 2010-06-24 NOTE — Progress Notes (Signed)
Summary: pneumonia shot  Phone Note Call from Patient   Caller: wife Summary of Call: He is in hospital & they want to know if he's had pneumonia shot.  No record of it.  Best to tell them to give.  Discussed all with BW. Initial call taken by: Rudy Jew, RN,  September 19, 2009 8:43 AM

## 2010-06-24 NOTE — Progress Notes (Signed)
Summary: low abd pain  Phone Note Call from Patient Call back at Home Phone [phone]   Caller: Patient Call For: Stacie Glaze MD Reason for Call: Acute Illness Complaint: Abdominal Pain Summary of Call: Pt is calling complaining of lower abdomen pain with minimal dysuria.  Feels heaviness as well as pain in abdomen.  No fever, vomiting, diarrhea, or any other complaints.  He thinks it is related to his prostate. [phone] Initial call taken by: Lynann Beaver CMA,  July 31, 2009 12:12 PM  Follow-up for Phone Call        ov given Follow-up by: Willy Eddy, LPN,  July 31, 2009 1:19 PM

## 2010-06-24 NOTE — Consult Note (Signed)
Summary: [facility]   Imported By: Maryln Gottron 01/23/2010 11:11:42  _____________________________________________________________________  External Attachment:    Type:   Image     Comment:   External Document

## 2010-06-24 NOTE — Progress Notes (Signed)
Summary: edema  Phone Note Call from Patient   Caller: Patient Call For: Stacie Glaze MD Summary of Call: Pt. has had swelling of feet, ankles and legs to knees x 5 days.  Cannot get shoes and socks on.  Keeping legs elevated does not help. Would like Dr. Lovell Sheehan to prescribe a fluid pill.  [facility]. Needs it called by noon.  [phone] Initial call taken by: Lynann Beaver CMA,  Sep 30, 2009 9:01 AM  Follow-up for Phone Call        ov given for tomorrow-couldnt come today at ov offered Follow-up by: Willy Eddy, LPN,  Oct 01, 9526 1:56 PM

## 2010-06-24 NOTE — Assessment & Plan Note (Signed)
Summary: swollen legs/bmw   Vital Signs:  Patient profile:   77 year old male Height:      71 inches Weight:      222 pounds BMI:     31.07 Temp:     98.3 degrees F oral Pulse rate:   80 / minute Resp:     14 per minute BP sitting:   170 / 78  (left arm)  Vitals Entered By: Willy Eddy, LPN (Oct 01, 2009 11:03 AM) CC: roa from hosptial-legs and feet are swelling- wakes up in am with swollen lower extremity and stays swollen all day-, Hypertension Management   Primary Care Provider:  Darryll Capers, MD   CC:  roa from hosptial-legs and feet are swelling- wakes up in am with swollen lower extremity and stays swollen all day- and Hypertension Management.  History of Present Illness: The pt ha an acute ilness with hepatiait and renal failure the pt presents with elevated blood pressure and with 2 plus edem in the lead without SOB of orthopnea the pt's hospital reports were reviewed wth his son and with him and we discusdded the rena;l and hepatic causes of edema he has no signs of CHF I have spent greater that 45 min face to face evaluating this patient and over 1/2 of this time was in councilling Hx of DM and HTn lead [place] to fear nephrotic syndrome for the edema  Hypertension History:      He denies headache, chest pain, palpitations, dyspnea with exertion, orthopnea, PND, peripheral edema, visual symptoms, neurologic problems, syncope, and side effects from treatment.        Positive major cardiovascular risk factors include male age 98 years old or older, diabetes, hyperlipidemia, and hypertension.  Negative major cardiovascular risk factors include non-tobacco-user status.     Preventive Screening-Counseling & Management  Alcohol-Tobacco     Smoking Status: quit  Problems Prior to Update: 1)  Weight Loss  (ICD-783.21) 2)  Urticaria  (ICD-708.9) 3)  Nausea  (ICD-787.02) 4)  Fatigue  (ICD-780.79) 5)  Diverticulitis of Colon  (ICD-562.11) 6)  Diverticulosis-colon   (ICD-562.10) 7)  Screening Colorectal-cancer  (ICD-V76.51) 8)  Chronic Kidney Disease Stage II (MILD)  (ICD-585.2) 9)  Chronic Prostatitis  (ICD-601.1) 10)  Advef, Drug/medicinal/biological Subst Nos  (ICD-995.20) 11)  Family History Diabetes 1st Degree Relative  (ICD-V18.0) 12)  Family History of Cad Male 1st Degree Relative <50  (ICD-V17.3) 13)  Osteoarthritis  (ICD-715.90) 14)  Hypertension  (ICD-401.9) 15)  Hyperlipidemia  (ICD-272.4) 16)  Gerd  (ICD-530.81) 17)  Diabetes Mellitus, Type II  (ICD-250.00)  Current Problems (verified): 1)  Weight Loss  (ICD-783.21) 2)  Urticaria  (ICD-708.9) 3)  Nausea  (ICD-787.02) 4)  Fatigue  (ICD-780.79) 5)  Diverticulitis of Colon  (ICD-562.11) 6)  Diverticulosis-colon  (ICD-562.10) 7)  Screening Colorectal-cancer  (ICD-V76.51) 8)  Chronic Kidney Disease Stage II (MILD)  (ICD-585.2) 9)  Chronic Prostatitis  (ICD-601.1) 10)  Advef, Drug/medicinal/biological Subst Nos  (ICD-995.20) 11)  Family History Diabetes 1st Degree Relative  (ICD-V18.0) 12)  Family History of Cad Male 1st Degree Relative <50  (ICD-V17.3) 13)  Osteoarthritis  (ICD-715.90) 14)  Hypertension  (ICD-401.9) 15)  Hyperlipidemia  (ICD-272.4) 16)  Gerd  (ICD-530.81) 17)  Diabetes Mellitus, Type II  (ICD-250.00)  Medications Prior to Update: 1)  Aspirin 81 Mg  Tabs (Aspirin) .... One Tablet By Mouth Once Daily 2)  Avandia 8 Mg Tabs (Rosiglitazone Maleate) .... 1/2 Once Daily 3)  Lotrel 5-20 Mg Caps (  Amlodipine Besy-Benazepril Hcl) .... Once Daily 4)  Metformin Hcl 500 Mg Tb24 (Metformin Hcl) .... Two  Daily 5)  Nabumetone 750 Mg Tabs (Nabumetone) .... Take 1 Tablet By Mouth Twice A Day 6)  Metoprolol Tartrate 100 Mg  Tabs (Metoprolol Tartrate) .... Once Daily 7)  Crestor 20 Mg  Tabs (Rosuvastatin Calcium) .... Once Daily 8)  Onetouch Ultra Test   Strp (Glucose Blood) .... Use As Directed Before Meals 9)  Glipizide 10 Mg Xr24h-Tab (Glipizide) .... One Daily  Current  Medications (verified): 1)  Aspirin 81 Mg  Tabs (Aspirin) .... One Tablet By Mouth Once Daily 2)  Avandia 8 Mg Tabs (Rosiglitazone Maleate) .... 1/2 Once Daily 3)  Metformin Hcl 500 Mg Tb24 (Metformin Hcl) .... Two  Daily 4)  Nabumetone 750 Mg Tabs (Nabumetone) .... Take 1 Tablet By Mouth Twice A   Hold For Now 5)  Metoprolol Tartrate 100 Mg  Tabs (Metoprolol Tartrate) .... Once Daily 6)  Crestor 20 Mg  Tabs (Rosuvastatin Calcium) .... Once Daily 7)  Onetouch Ultra Test   Strp (Glucose Blood) .... Use As Directed Before Meals 8)  Glipizide 10 Mg Xr24h-Tab (Glipizide) .... One Daily 9)  Furosemide 20 Mg Tabs (Furosemide) .... One By Mouth Daily 10)  Benazepril Hcl 20 Mg Tabs (Benazepril Hcl) .... One By Mouth Daily  Allergies (verified): No Known Drug Allergies  Past History:  Family History: Last updated: 07/11/2009 Family History of CAD Male 1st degree relative <50 Family History Diabetes 1st degree relative No FH of Colon Cancer:  Social History: Last updated: 07/11/2009 Retired Married 3 childern Former Smoker in high school Alcohol Use - no Daily Caffeine Use: 1 daily  Illicit Drug Use - no  Risk Factors: Exercise: yes (03/16/2007)  Risk Factors: Smoking Status: quit (10/01/2009)  Past medical, surgical, family and social histories (including risk factors) reviewed, and no changes noted (except as noted below).  Past Medical History: Reviewed history from 07/11/2009 and no changes required. Diabetes mellitus, type II GERD Hyperlipidemia Hypertension Osteoarthritis Diverticulosis Hemorrhoids Hyperplastic Colon Polyps  Past Surgical History: Reviewed history from 12/13/2006 and no changes required. Total knee replacement bilateral Coronary artery bypass graft x 5 1989 Cholecystectomy Tonsillectomy  Family History: Reviewed history from 07/11/2009 and no changes required. Family History of CAD Male 1st degree relative <50 Family History Diabetes 1st  degree relative No FH of Colon Cancer:  Social History: Reviewed history from 07/11/2009 and no changes required. Retired Married 3 childern Former Smoker in high school Alcohol Use - no Daily Caffeine Use: 1 daily  Illicit Drug Use - no  Review of Systems       The patient complains of hoarseness and peripheral edema.  The patient denies anorexia, fever, weight loss, weight gain, vision loss, decreased hearing, chest pain, syncope, dyspnea on exertion, prolonged cough, headaches, abdominal pain, melena, hematochezia, severe indigestion/heartburn, hematuria, incontinence, genital sores, muscle weakness, suspicious skin lesions, transient blindness, difficulty walking, depression, unusual weight change, abnormal bleeding, enlarged lymph nodes, angioedema, breast masses, and testicular masses.    Physical Exam  General:  alert, well-developed, overweight-appearing, and pale.  not jaudiced Head:  Normocephalic and atraumatic without obvious abnormalities. No apparent alopecia or balding. Ears:  R ear normal and L ear normal.   Nose:  no external deformity and no nasal discharge.   Lungs:  normal respiratory effort and no wheezes.   Heart:  normal rate and regular rhythm.   Abdomen:  soft, non-tender, and normal bowel sounds.   Genitalia:  circumcised and uncircumcised.   Msk:  normal ROM, no joint tenderness, and no joint swelling.   Extremities:  2+ left pedal edema and 2+ right pedal edema.   Neurologic:  alert & oriented X3 and gait normal.   Skin:  decreased turgor and edema.   Cervical Nodes:  No lymphadenopathy noted Axillary Nodes:  No palpable lymphadenopathy Psych:  Oriented X3, good eye contact, and not anxious appearing.     Impression & Recommendations:  Problem # 1:  DIAB W/RENAL MANIFESTS TYPE II/UNS NOT UNCNTRL (ICD-250.40) Assessment Deteriorated  monter for nephrotic symdrome add compression hose The following medications were removed from the medication  list:    Lotrel 5-20 Mg Caps (Amlodipine besy-benazepril hcl) ..... Once daily His updated medication list for this problem includes:    Aspirin 81 Mg Tabs (Aspirin) ..... One tablet by mouth once daily    Avandia 8 Mg Tabs (Rosiglitazone maleate) .[name]... 1/2 once daily    Metformin Hcl 500 Mg Tb24 (Metformin hcl) .[name]..[name] Two  daily    Glipizide 10 Mg Xr24h-tab (Glipizide) ..... One daily    Benazepril Hcl 20 Mg Tabs (Benazepril hcl) ..... One by mouth daily  Labs Reviewed: Creat: 1.9 (09/16/2009)     Last Eye Exam: diabetic retinopathy (07/23/2008) Reviewed HgBA1c results: 6.6 (02/11/2009)  6.7 (07/26/2008)  Orders: TLB-A1C / Hgb A1C (Glycohemoglobin) (83036-A1C) TLB-Udip w/ Micro (81001-URINE) T-Urine 24 Hr. Protein [phone]) Prescription Created Electronically [phone])  Problem # 2:  EDEMA (ICD-782.3) Assessment: New  add lasix due to the edem and the belief that this is nephrotic from DM His updated medication list for this problem includes:    Furosemide 20 Mg Tabs (Furosemide) ..... One by mouth daily  Discussed elevation of the legs, use of compression stockings, sodium restiction, and medication use.   Orders: TLB-BMP (Basic Metabolic Panel-BMET) (80048-METABOL)  Problem # 3:  UNSPECIFIED HEPATITIS (ICD-573.3)  the pt need monitering of liver function and the albumin  Orders: Venipuncture (13086) TLB-Hepatic/Liver Function Pnl (80076-HEPATIC) TLB-Albumin (82040-ALB) TLB-GGT (Gamma GT) (82977-GGT)  Complete Medication List: 1)  Aspirin 81 Mg Tabs (Aspirin) .... One tablet by mouth once daily 2)  Avandia 8 Mg Tabs (Rosiglitazone maleate) .... 1/2 once daily 3)  Metformin Hcl 500 Mg Tb24 (Metformin hcl) .... Two  daily 4)  Nabumetone 750 Mg Tabs (Nabumetone) .... Take 1 tablet by mouth twice a   hold for now 5)  Metoprolol Tartrate 100 Mg Tabs (Metoprolol tartrate) .... Once daily 6)  Crestor 20 Mg Tabs (Rosuvastatin calcium) .... Once daily 7)  Onetouch Ultra  Test Strp (Glucose blood) .... Use as directed before meals 8)  Glipizide 10 Mg Xr24h-tab (Glipizide) .... One daily 9)  Furosemide 20 Mg Tabs (Furosemide) .... One by mouth daily 10)  Benazepril Hcl 20 Mg Tabs (Benazepril hcl) .... One by mouth daily 11)  Onetouch Ultra Test Strp (Glucose blood) .... Use two times a day as directed 12)  Onetouch Finepoint Lancets Misc (Lancets) .... Use as directed  Hypertension Assessment/Plan:      The patient's hypertensive risk group is category C: Target organ damage and/or diabetes.  His calculated 10 year risk of coronary heart disease is 33 %.  Today's blood pressure is 170/78.  His blood pressure goal is < 130/80.  Patient Instructions: 1)  keep thje appointment on 5/25 Prescriptions: ONETOUCH FINEPOINT LANCETS  MISC (LANCETS) use as directed  #100 x 11   Entered and Authorized by:   Stacie Glaze MD   Signed  by:   Stacie Glaze MD on 10/01/2009   Method used:   Electronically to        [facility]* (retail)       [address]       [place], [place]  [address]       Ph: [phone]       Fax: [phone]   RxID:   [id number] [place] TEST  STRP (GLUCOSE BLOOD) use two times a day as directed  #100 x 11   Entered and Authorized by:   Stacie Glaze MD   Signed by:   Stacie Glaze MD on 10/01/2009   Method used:   Electronically to        [facility]* (retail)       [address]       [place], [place]  [address]       Ph: [phone]       Fax: [phone]   RxID:   [id number] GLIPIZIDE 10 MG XR24H-TAB (GLIPIZIDE) one daily  #90 x 3   Entered and Authorized by:   Stacie Glaze MD   Signed by:   Stacie Glaze MD on 10/01/2009   Method used:   Electronically to        [facility]* (retail)       [address]       [place], [place]  [address]       Ph: [phone]       Fax: [phone]   RxID:   [id number] BENAZEPRIL HCL 20 MG TABS (BENAZEPRIL HCL) one by mouth daily  #30 x 11   Entered and  Authorized by:   Stacie Glaze MD   Signed by:   Stacie Glaze MD on 10/01/2009   Method used:   Electronically to        [facility]* (retail)       [address]       [place], [place]  [address]       Ph: [phone]       Fax: [phone]   RxID:   [id number] FUROSEMIDE 20 MG TABS (FUROSEMIDE) one by mouth daily  #30 x 11   Entered and Authorized by:   Stacie Glaze MD   Signed by:   Stacie Glaze MD on 10/01/2009   Method used:   Electronically to        [facility]* (retail)       [address]       [place], [place]  [address]       Ph: [phone]       Fax: [phone]   RxID:   [id number]    Immunization History:  Pneumovax Immunization History:    Pneumovax:  pneumovax (09/24/2009)  Appended Document: Orders Update    Clinical Lists Changes  Orders: Added new Service order of UA Dipstick w/o Micro (automated)  (81003) - Signed Observations: Added new observation of COMMENTS: Rita Ohara  Oct 01, 2009 11:52 AM  (10/01/2009 11:47) Added new observation of PH URINE: 5.0  (10/01/2009 11:47) Added new observation of SPEC GR URIN: 1.025  (10/01/2009 11:47) Added new observation of APPEARANCE U: Clear  (10/01/2009 11:47) Added new observation of UA COLOR: yellow  (10/01/2009 11:47) Added new observation of WBC DIPSTK U: negative  (10/01/2009 11:47) Added new observation of NITRITE URN: negative  (10/01/2009 11:47) Added new observation of UROBILINOGEN: 0.2  (10/01/2009 11:47) Added new observation of PROTEIN, URN: trace  (10/01/2009 11:47) Added  new observation of BLOOD UR DIP: negative  (10/01/2009 11:47) Added new observation of KETONES URN: negative  (10/01/2009 11:47) Added new observation of BILIRUBIN UR: negative  (10/01/2009 11:47) Added new observation of GLUCOSE, URN: negative  (10/01/2009 11:47)      Laboratory Results   Urine Tests    Routine Urinalysis   Color: yellow Appearance: Clear Glucose: negative   (Normal  Range: Negative) Bilirubin: negative   (Normal Range: Negative) Ketone: negative   (Normal Range: Negative) Spec. Gravity: 1.025   (Normal Range: 1.003-1.035) Blood: negative   (Normal Range: Negative) pH: 5.0   (Normal Range: 5.0-8.0) Protein: trace   (Normal Range: Negative) Urobilinogen: 0.2   (Normal Range: 0-1) Nitrite: negative   (Normal Range: Negative) Leukocyte Esterace: negative   (Normal Range: Negative)    Comments: Rita Ohara  Oct 01, 2009 11:52 AM

## 2010-06-24 NOTE — Assessment & Plan Note (Signed)
Summary: 2 MONTH FOLLOW UP/CJR   Vital Signs:  Patient profile:   77 year old male Height:      71 inches Weight:      206 pounds BMI:     28.84 Temp:     98.2 degrees F oral Pulse rate:   76 / minute Resp:     14 per minute BP sitting:   150 / 80  (left arm)  Vitals Entered By: Willy Eddy, LPN (February 21, 2010 9:03 AM) CC: roa- c/o left flank pain radiaing around to rib area-= states all of abd  is painful- more  constapated than usual-continues to hold crestor since having lyme disease, Hypertension Management Is Patient Diabetic? Yes Did you bring your meter with you today? No   Primary Care Provider:  Darryll Capers, MD   CC:  roa- c/o left flank pain radiaing around to rib area-= states all of abd  is painful- more  constapated than usual-continues to hold crestor since having lyme disease and Hypertension Management.  History of Present Illness: The pt has left flank pain urine is normal color he has "stomach" cramps and constipation alternating with normal stools the pain has interfered with sleep no fever of chills mild increased gas swollen after eating,, resolves in 4 hours after eating CBG's 88-104 range  Hypertension History:      He denies headache, chest pain, palpitations, dyspnea with exertion, orthopnea, PND, peripheral edema, visual symptoms, neurologic problems, syncope, and side effects from treatment.        Positive major cardiovascular risk factors include male age 63 years old or older, diabetes, hyperlipidemia, and hypertension.  Negative major cardiovascular risk factors include non-tobacco-user status.     Preventive Screening-Counseling & Management  Alcohol-Tobacco     Smoking Status: quit     Tobacco Counseling: not indicated; no tobacco use  Problems Prior to Update: 1)  Temporomandibular Joint Disorder  (ICD-524.60) 2)  Unspecified Hepatitis  (ICD-573.3) 3)  Diab W/renal Manifests Type Ii/uns Not Uncntrl  (ICD-250.40) 4)  Edema   (ICD-782.3) 5)  Weight Loss  (ICD-783.21) 6)  Urticaria  (ICD-708.9) 7)  Fatigue  (ICD-780.79) 8)  Diverticulosis-colon  (ICD-562.10) 9)  Screening Colorectal-cancer  (ICD-V76.51) 10)  Chronic Kidney Disease Stage II (MILD)  (ICD-585.2) 11)  Chronic Prostatitis  (ICD-601.1) 12)  Advef, Drug/medicinal/biological Subst Nos  (ICD-995.20) 13)  Family History Diabetes 1st Degree Relative  (ICD-V18.0) 14)  Family History of Cad Male 1st Degree Relative <50  (ICD-V17.3) 15)  Osteoarthritis  (ICD-715.90) 16)  Hypertension  (ICD-401.9) 17)  Hyperlipidemia  (ICD-272.4) 18)  Gerd  (ICD-530.81)  Current Problems (verified): 1)  Temporomandibular Joint Disorder  (ICD-524.60) 2)  Unspecified Hepatitis  (ICD-573.3) 3)  Diab W/renal Manifests Type Ii/uns Not Uncntrl  (ICD-250.40) 4)  Edema  (ICD-782.3) 5)  Weight Loss  (ICD-783.21) 6)  Urticaria  (ICD-708.9) 7)  Fatigue  (ICD-780.79) 8)  Diverticulosis-colon  (ICD-562.10) 9)  Screening Colorectal-cancer  (ICD-V76.51) 10)  Chronic Kidney Disease Stage II (MILD)  (ICD-585.2) 11)  Chronic Prostatitis  (ICD-601.1) 12)  Advef, Drug/medicinal/biological Subst Nos  (ICD-995.20) 13)  Family History Diabetes 1st Degree Relative  (ICD-V18.0) 14)  Family History of Cad Male 1st Degree Relative <50  (ICD-V17.3) 15)  Osteoarthritis  (ICD-715.90) 16)  Hypertension  (ICD-401.9) 17)  Hyperlipidemia  (ICD-272.4) 18)  Gerd  (ICD-530.81)  Medications Prior to Update: 1)  Aspirin 81 Mg  Tabs (Aspirin) .... One Tablet By Mouth Once Daily 2)  Kombiglyze Xr 2.09-998  Mg Xr24h-Tab (Saxagliptin-Metformin) .... One By Mouth Daily 3)  Metoprolol Tartrate 100 Mg  Tabs (Metoprolol Tartrate) .... Once Daily 4)  Crestor 20 Mg  Tabs (Rosuvastatin Calcium) .... Once Daily 5)  Onetouch Ultra Test   Strp (Glucose Blood) .... Use As Directed Before Meals 6)  Glipizide 10 Mg Xr24h-Tab (Glipizide) .... One Daily 7)  Furosemide 20 Mg Tabs (Furosemide) .... One By Mouth Daily 8)   Onetouch Ultra Test  Strp (Glucose Blood) .... Use Two Times A Day As Directed 9)  Onetouch Finepoint Lancets  Misc (Lancets) .... Use As Directed 10)  Benazepril Hcl 40 Mg Tabs (Benazepril Hcl) .... One By Mouth Daily 11)  Clotrimazole-Betamethasone 1-0.05 % Crea (Clotrimazole-Betamethasone) .... Apply To Rash Two Times A Day 12)  Fluconazole 100 Mg Tabs (Fluconazole) .... One By Mouth Daily For 14 Days 13)  Doxycycline Hyclate 100 Mg Caps (Doxycycline Hyclate) .[name].. 1 Two Times A Day For 30 Days  Current Medications (verified): 1)  Aspirin 81 Mg  Tabs (Aspirin) .... One Tablet By Mouth Once Daily 2)  Kombiglyze Xr 2.09-998 Mg Xr24h-Tab (Saxagliptin-Metformin) .... One By Mouth Daily 3)  Metoprolol Tartrate 100 Mg  Tabs (Metoprolol Tartrate) .... Once Daily 4)  Crestor 20 Mg  Tabs (Rosuvastatin Calcium) .... Once Daily---Hold 5)  Glipizide 10 Mg Xr24h-Tab (Glipizide) .... One Daily 6)  Furosemide 20 Mg Tabs (Furosemide) .... One By Mouth Daily 7)  Onetouch Ultra Test  Strp (Glucose Blood) .... Use Two Times A Day As Directed 8)  Onetouch Finepoint Lancets  Misc (Lancets) .... Use As Directed 9)  Benazepril Hcl 40 Mg Tabs (Benazepril Hcl) .... One By Mouth Daily 10)  Clotrimazole-Betamethasone 1-0.05 % Crea (Clotrimazole-Betamethasone) .... Apply To Rash Two Times A Day  Allergies (verified): No Known Drug Allergies  Past History:  Family History: Last updated: 07/11/2009 Family History of CAD Male 1st degree relative <50 Family History Diabetes 1st degree relative No FH of Colon Cancer:  Social History: Last updated: 07/11/2009 Retired Married 3 childern Former Smoker in high school Alcohol Use - no Daily Caffeine Use: 1 daily  Illicit Drug Use - no  Risk Factors: Exercise: yes (03/16/2007)  Risk Factors: Smoking Status: quit (02/21/2010)  Past medical, surgical, family and social histories (including risk factors) reviewed, and no changes noted (except as noted  below).  Past Medical History: Reviewed history from 07/11/2009 and no changes required. Diabetes mellitus, type II GERD Hyperlipidemia Hypertension Osteoarthritis Diverticulosis Hemorrhoids Hyperplastic Colon Polyps  Past Surgical History: Reviewed history from 12/13/2006 and no changes required. Total knee replacement bilateral Coronary artery bypass graft x 5 1989 Cholecystectomy Tonsillectomy  Family History: Reviewed history from 07/11/2009 and no changes required. Family History of CAD Male 1st degree relative <50 Family History Diabetes 1st degree relative No FH of Colon Cancer:  Social History: Reviewed history from 07/11/2009 and no changes required. Retired Married 3 childern Former Smoker in high school Alcohol Use - no Daily Caffeine Use: 1 daily  Illicit Drug Use - no  Review of Systems  The patient denies anorexia, fever, weight loss, weight gain, vision loss, decreased hearing, hoarseness, chest pain, syncope, dyspnea on exertion, peripheral edema, prolonged cough, headaches, hemoptysis, abdominal pain, melena, hematochezia, severe indigestion/heartburn, hematuria, incontinence, genital sores, muscle weakness, suspicious skin lesions, transient blindness, difficulty walking, depression, unusual weight change, abnormal bleeding, enlarged lymph nodes, angioedema, and breast masses.    Physical Exam  General:  Well-developed,well-nourished,in no acute distress; alert,appropriate and cooperative throughout examination Head:  Normocephalic and  atraumatic without obvious abnormalities. No apparent alopecia or balding. Eyes:  pupils equal and pupils round.   Ears:  R ear normal and L ear normal.   Nose:  no external deformity and no nasal discharge.   Mouth:  TMJ pain in the right jaw Neck:  supple.   Lungs:  normal respiratory effort and no wheezes.   Heart:  normal rate and regular rhythm.   Abdomen:  tender in the left lower quadrant no pain on impact to  the back bowel sound increased no rash on abdominal wall Msk:  normal ROM and no joint tenderness.   Extremities:  trace left pedal edema and trace right pedal edema.   Neurologic:  alert & oriented X3 and finger-to-nose normal.     Impression & Recommendations:  Problem # 1:  DIAB W/RENAL MANIFESTS TYPE II/UNS NOT UNCNTRL (ICD-250.40) Assessment New  stable His updated medication list for this problem includes:    Aspirin 81 Mg Tabs (Aspirin) ..... One tablet by mouth once daily    Kombiglyze Xr 2.09-998 Mg Xr24h-tab (Saxagliptin-metformin) ..... One by mouth daily    Glipizide 10 Mg Xr24h-tab (Glipizide) ..... One daily    Benazepril Hcl 40 Mg Tabs (Benazepril hcl) ..... One by mouth daily  Labs Reviewed: Creat: 1.1 (10/01/2009)     Last Eye Exam: diabetic retinopathy (07/23/2008) Reviewed HgBA1c results: 7.0 (10/01/2009)  6.6 (02/11/2009)  Problem # 2:  DIVERTICULITIS OF COLON (ICD-562.11) Assessment: Unchanged  the pt has symptoms  consistant with infection will order CBC wil check  LFTS and amylase as well since the deferential includes other intra abdominal causes will observe fro shingles due to location Colonoscopy:  Labs Reviewed: Hgb: 13.0 (09/16/2009)   Hct: 37.8 (09/16/2009)   WBC: 13.9 (09/16/2009)  Orders: Venipuncture (16109) TLB-CBC Platelet - w/Differential (85025-CBCD) TLB-Hepatic/Liver Function Pnl (80076-HEPATIC) TLB-Amylase (82150-AMYL)  Complete Medication List: 1)  Aspirin 81 Mg Tabs (Aspirin) .... One tablet by mouth once daily 2)  Kombiglyze Xr 2.09-998 Mg Xr24h-tab (Saxagliptin-metformin) .... One by mouth daily 3)  Metoprolol Tartrate 100 Mg Tabs (Metoprolol tartrate) .... Once daily 4)  Crestor 20 Mg Tabs (Rosuvastatin calcium) .... Once daily---hold 5)  Glipizide 10 Mg Xr24h-tab (Glipizide) .... One daily 6)  Furosemide 20 Mg Tabs (Furosemide) .... One by mouth daily 7)  Onetouch Ultra Test Strp (Glucose blood) .... Use two times a day  as directed 8)  Onetouch Finepoint Lancets Misc (Lancets) .... Use as directed 9)  Benazepril Hcl 40 Mg Tabs (Benazepril hcl) .... One by mouth daily 10)  Clotrimazole-betamethasone 1-0.05 % Crea (Clotrimazole-betamethasone) .... Apply to rash two times a day  Hypertension Assessment/Plan:      The patient's hypertensive risk group is category C: Target organ damage and/or diabetes.  His calculated 10 year risk of coronary heart disease is 27 %.  Today's blood pressure is 150/80.  His blood pressure goal is < 130/80.  Patient Instructions: 1)  call with condition monday 2)  if high fever/chills ocur go to ER 3)  if rash occurs may be shingles ( there is a sat clinic... call the oncall number for valtrex 4)  Please schedule a follow-up appointment in 2 months.   Immunization History:  Influenza Immunization History:    Influenza:  historical (01/23/2010)   Appended Document: Orders Update    Clinical Lists Changes  Orders: Added new Service order of Specimen Handling (60454) - Signed

## 2010-06-24 NOTE — Assessment & Plan Note (Signed)
Summary: rash, fever, mylalgias/dm   Vital Signs:  Patient profile:   77 year old male Weight:      216 pounds Temp:     97.6 degrees F oral BP sitting:   124 / 70  (right arm) Cuff size:   regular  Vitals Entered By: Duard Brady LPN (September 16, 2009 11:07 AM) CC: c/o fatigue , weakness, bs's running 250's , rash to entire body , painful , shingles? Is Patient Diabetic? Yes Did you bring your meter with you today? No CBG Result 366   Primary Care Provider:  Darryll Capers, MD   CC:  c/o fatigue , weakness, bs's running 250's , rash to entire body , painful , and shingles?.  History of Present Illness: 77 year old patient who presents today with a one week history of anorexia and intermittent low grade fever.  He has had some mild nausea, but no vomiting, or change in his bowel habits.  He has lost 11 pounds in weight since his last visit here in approximately 6 weeks ago.  At that time, he was placed on antibiotic therapy for suspected mild diverticulitis.  Yesterday morning, he developed a rather diffuse urticarial rash associated with weakness and myalgias.  The red urticarial rash is nonpruritic.  He has type 2 diabetes, which has been poorly controlled of late, with blood sugars in the 250 range.  His main complaint today is fatigue, weakness, and myalgias.  Denies any GU symptoms  Allergies (verified): No Known Drug Allergies  Past History:  Past Medical History: Reviewed history from 07/11/2009 and no changes required. Diabetes mellitus, type II GERD Hyperlipidemia Hypertension Osteoarthritis Diverticulosis Hemorrhoids Hyperplastic Colon Polyps  Past Surgical History: Reviewed history from 12/13/2006 and no changes required. Total knee replacement bilateral Coronary artery bypass graft x 5 1989 Cholecystectomy Tonsillectomy  Family History: Reviewed history from 07/11/2009 and no changes required. Family History of CAD Male 1st degree relative <50 Family  History Diabetes 1st degree relative No FH of Colon Cancer:  Review of Systems       The patient complains of anorexia, fever, weight loss, muscle weakness, suspicious skin lesions, and difficulty walking.  The patient denies weight gain, vision loss, decreased hearing, hoarseness, chest pain, syncope, dyspnea on exertion, peripheral edema, prolonged cough, headaches, hemoptysis, abdominal pain, melena, hematochezia, severe indigestion/heartburn, hematuria, incontinence, genital sores, unusual weight change, abnormal bleeding, enlarged lymph nodes, angioedema, breast masses, and testicular masses.    Physical Exam  General:  overweight-appearing.  120/70overweight-appearing.   Head:  Normocephalic and atraumatic without obvious abnormalities. No apparent alopecia or balding. Eyes:  No corneal or conjunctival inflammation noted. EOMI. Perrla. Funduscopic exam benign, without hemorrhages, exudates or papilledema. Vision grossly normal. Ears:  External ear exam shows no significant lesions or deformities.  Otoscopic examination reveals clear canals, tympanic membranes are intact bilaterally without bulging, retraction, inflammation or discharge. Hearing is grossly normal bilaterally. Mouth:  Oral mucosa and oropharynx without lesions or exudates.  Teeth in good repair. Neck:  No deformities, masses, or tenderness noted. Chest Wall:  sternotomy scar Lungs:  a few crackles at the right base Heart:  Normal rate and regular rhythm. S1 and S2 normal without gallop, murmur, click, rub or other extra sounds. Abdomen:  mildly obese, soft and nontender.  Normal bowel sounds Genitalia:  mild erythema of the glans penis, especially around the meatus Msk:  No deformity or scoliosis noted of thoracic or lumbar spine.   Extremities:  No clubbing, cyanosis, edema, or deformity  noted with normal full range of motion of all joints.   Skin:  generalized patchy urticarial lesions that blanched, most marked over the  back and trunk area Cervical Nodes:  No lymphadenopathy noted Axillary Nodes:  No palpable lymphadenopathy Inguinal Nodes:  No significant adenopathy   Impression & Recommendations:  Problem # 1:  WEIGHT LOSS (ICD-783.21)  Orders: Venipuncture (16109) TLB-BMP (Basic Metabolic Panel-BMET) (80048-METABOL) TLB-CBC Platelet - w/Differential (85025-CBCD) TLB-Sedimentation Rate (ESR) (85652-ESR) TLB-Hepatic/Liver Function Pnl (80076-HEPATIC)  Problem # 2:  URTICARIA (ICD-708.9)  Orders: Venipuncture (60454) TLB-BMP (Basic Metabolic Panel-BMET) (80048-METABOL) TLB-CBC Platelet - w/Differential (85025-CBCD) TLB-Sedimentation Rate (ESR) (85652-ESR) TLB-Hepatic/Liver Function Pnl (80076-HEPATIC)  Problem # 3:  NAUSEA (ICD-787.02)  Orders: Venipuncture (09811) TLB-BMP (Basic Metabolic Panel-BMET) (80048-METABOL) TLB-CBC Platelet - w/Differential (85025-CBCD) TLB-Sedimentation Rate (ESR) (85652-ESR) TLB-Hepatic/Liver Function Pnl (80076-HEPATIC)  Problem # 4:  FATIGUE (ICD-780.79)  Orders: Venipuncture (91478) TLB-BMP (Basic Metabolic Panel-BMET) (80048-METABOL) TLB-CBC Platelet - w/Differential (85025-CBCD) TLB-Sedimentation Rate (ESR) (85652-ESR) TLB-Hepatic/Liver Function Pnl (80076-HEPATIC)  Complete Medication List: 1)  Aspirin 81 Mg Tabs (Aspirin) .... One tablet by mouth once daily 2)  Avandia 8 Mg Tabs (Rosiglitazone maleate) .... 1/2 once daily 3)  Lotrel 5-20 Mg Caps (Amlodipine besy-benazepril hcl) .... Once daily 4)  Metformin Hcl 500 Mg Tb24 (Metformin hcl) .... Two  daily 5)  Nabumetone 750 Mg Tabs (Nabumetone) .... Take 1 tablet by mouth twice a day 6)  Metoprolol Tartrate 100 Mg Tabs (Metoprolol tartrate) .... Once daily 7)  Crestor 20 Mg Tabs (Rosuvastatin calcium) .... Once daily 8)  Onetouch Ultra Test Strp (Glucose blood) .... Use as directed before meals 9)  Glipizide 10 Mg Xr24h-tab (Glipizide) .... One daily  Other Orders: Capillary Blood Glucose/CBG  [phone])  Patient Instructions: 1)  Please schedule a follow-up appointment in 4 days 2)  lamisil cream twice daily 3)  call if worsens or go to ED 4)  Take 650-1000mg  of Tylenol every 4-6 hours as needed for relief of pain or comfort of fever AVOID taking more than 4000mg   in a 24 hour period (can cause liver damage in higher doses). 5)  hold all meds except diabetic meds Prescriptions: GLIPIZIDE 10 MG XR24H-TAB (GLIPIZIDE) one daily  #90 x 0   Entered and Authorized by:   Gordy Savers  MD   Signed by:   Gordy Savers  MD on 09/16/2009   Method used:   Electronically to        [facility]* (retail)       [id number]       [place], [place]  [address]       Ph: [phone]       Fax: [phone]   RxID:   [id number]   Laboratory Results   Urine Tests  Date/Time Received: September 16, 2009 12:45 PM  Date/Time Reported: September 16, 2009 12:45 PM   Routine Urinalysis   Color: straw Appearance: Hazy Glucose: >=1000   (Normal Range: Negative) Bilirubin: negative   (Normal Range: Negative) Ketone: negative   (Normal Range: Negative) Spec. Gravity: 1.010   (Normal Range: 1.003-1.035) Blood: negative   (Normal Range: Negative) pH: 5.0   (Normal Range: 5.0-8.0) Protein: trace   (Normal Range: Negative) Urobilinogen: 0.2   (Normal Range: 0-1) Nitrite: negative   (Normal Range: Negative) Leukocyte Esterace: negative   (Normal Range: Negative)     Blood Tests     CBG Random:: 366mg /dL

## 2010-06-24 NOTE — Assessment & Plan Note (Signed)
Summary: roa/bmw   Vital Signs:  Patient profile:   77 year old male Height:      71 inches Weight:      208 pounds BMI:     29.11 Temp:     98.3 degrees F oral Pulse rate:   80 / minute Resp:     14 per minute BP sitting:   180 / 90  (left arm)  Vitals Entered By: Willy Eddy, LPN (November 22, 345 3:02 PM) CC: c/o pain in rt jaw- to derm and dentist - cant find problem- still has rash on trunk of body   Primary Care Provider:  Darryll Capers, MD   CC:  c/o pain in rt jaw- to derm and dentist - cant find problem- still has rash on trunk of body.  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  Preventive Screening-Counseling & Management  Alcohol-Tobacco     Smoking Status: quit  Problems Prior to Update: 1)  Unspecified Hepatitis  (ICD-573.3) 2)  Diab W/renal Manifests Type Ii/uns Not Uncntrl  (ICD-250.40) 3)  Edema  (ICD-782.3) 4)  Weight Loss  (ICD-783.21) 5)  Urticaria  (ICD-708.9) 6)  Fatigue  (ICD-780.79) 7)  Diverticulosis-colon  (ICD-562.10) 8)  Screening Colorectal-cancer  (ICD-V76.51) 9)  Chronic Kidney Disease Stage II (MILD)  (ICD-585.2) 10)  Chronic Prostatitis  (ICD-601.1) 11)  Advef, Drug/medicinal/biological Subst Nos  (ICD-995.20) 12)  Family History Diabetes 1st Degree Relative  (ICD-V18.0) 13)  Family History of Cad Male 1st Degree Relative <50  (ICD-V17.3) 14)  Osteoarthritis  (ICD-715.90) 15)  Hypertension  (ICD-401.9) 16)  Hyperlipidemia  (ICD-272.4) 17)  Gerd  (ICD-530.81)  Current Problems (verified): 1)  Unspecified Hepatitis  (ICD-573.3) 2)  Diab W/renal Manifests Type  Ii/uns Not Uncntrl  (ICD-250.40) 3)  Edema  (ICD-782.3) 4)  Weight Loss  (ICD-783.21) 5)  Urticaria  (ICD-708.9) 6)  Fatigue  (ICD-780.79) 7)  Diverticulosis-colon  (ICD-562.10) 8)  Screening Colorectal-cancer  (ICD-V76.51) 9)  Chronic Kidney Disease Stage II (MILD)  (ICD-585.2) 10)  Chronic Prostatitis  (ICD-601.1) 11)  Advef, Drug/medicinal/biological Subst Nos  (ICD-995.20) 12)  Family History Diabetes 1st Degree Relative  (ICD-V18.0) 13)  Family History of Cad Male 1st Degree Relative <50  (ICD-V17.3) 14)  Osteoarthritis  (ICD-715.90) 15)  Hypertension  (ICD-401.9) 16)  Hyperlipidemia  (ICD-272.4) 17)  Gerd  (ICD-530.81)  Medications Prior to Update: 1)  Aspirin 81 Mg  Tabs (Aspirin) .... One Tablet By Mouth Once Daily 2)  Kombiglyze Xr 2.09-998 Mg Xr24h-Tab (Saxagliptin-Metformin) .... One By Mouth Daily 3)  Metoprolol Tartrate 100 Mg  Tabs (Metoprolol Tartrate) .... Once Daily 4)  Crestor 20 Mg  Tabs (Rosuvastatin Calcium) .... Once Daily 5)  Onetouch Ultra Test   Strp (Glucose Blood) .... Use As Directed Before Meals 6)  Glipizide 10 Mg Xr24h-Tab (Glipizide) .... One Daily 7)  Furosemide 20 Mg Tabs (Furosemide) .... One By Mouth Daily 8)  Benazepril Hcl 20 Mg Tabs (Benazepril Hcl) .... Two By Mouth Daily  ( Total of 40) 9)  Onetouch Ultra Test  Strp (Glucose  Blood) .... Use Two Times A Day As Directed 10)  Onetouch Finepoint Lancets  Misc (Lancets) .... Use As Directed 11)  Benazepril Hcl 40 Mg Tabs (Benazepril Hcl) .... One By Mouth Daily 12)  Clotrimazole-Betamethasone 1-0.05 % Crea (Clotrimazole-Betamethasone) .... Apply To Rash Two Times A Day  Current Medications (verified): 1)  Aspirin 81 Mg  Tabs (Aspirin) .... One Tablet By Mouth Once Daily 2)  Kombiglyze Xr 2.09-998 Mg Xr24h-Tab (Saxagliptin-Metformin) .... One By Mouth Daily 3)  Metoprolol Tartrate 100 Mg  Tabs (Metoprolol Tartrate) .... Once Daily 4)  Crestor 20 Mg  Tabs (Rosuvastatin Calcium) .... Once Daily 5)   Onetouch Ultra Test   Strp (Glucose Blood) .... Use As Directed Before Meals 6)  Glipizide 10 Mg Xr24h-Tab (Glipizide) .... One Daily 7)  Furosemide 20 Mg Tabs (Furosemide) .... One By Mouth Daily 8)  Onetouch Ultra Test  Strp (Glucose Blood) .... Use Two Times A Day As Directed 9)  Onetouch Finepoint Lancets  Misc (Lancets) .... Use As Directed 10)  Benazepril Hcl 40 Mg Tabs (Benazepril Hcl) .... One By Mouth Daily 11)  Clotrimazole-Betamethasone 1-0.05 % Crea (Clotrimazole-Betamethasone) .... Apply To Rash Two Times A Day 12)  Fluconazole 100 Mg Tabs (Fluconazole) .... One By Mouth Daily For 14 Days  Allergies (verified): No Known Drug Allergies  Past History:  Family History: Last updated: 07/11/2009 Family History of CAD Male 1st degree relative <50 Family History Diabetes 1st degree relative No FH of Colon Cancer:  Social History: Last updated: 07/11/2009 Retired Married 3 childern Former Smoker in high school Alcohol Use - no Daily Caffeine Use: 1 daily  Illicit Drug Use - no  Risk Factors: Exercise: yes (03/16/2007)  Risk Factors: Smoking Status: quit (11/22/2009)  Past medical, surgical, family and social histories (including risk factors) reviewed, and no changes noted (except as noted below).  Past Medical History: Reviewed history from 07/11/2009 and no changes required. Diabetes mellitus, type II GERD Hyperlipidemia Hypertension Osteoarthritis Diverticulosis Hemorrhoids Hyperplastic Colon Polyps  Past Surgical History: Reviewed history from 12/13/2006 and no changes required. Total knee replacement bilateral Coronary artery bypass graft x 5 1989 Cholecystectomy Tonsillectomy  Family History: Reviewed history from 07/11/2009 and no changes required. Family History of CAD Male 1st degree relative <50 Family History Diabetes 1st degree relative No FH of Colon Cancer:  Social History: Reviewed history from 07/11/2009 and no changes  required. Retired Married 3 childern Former Smoker in high school Alcohol Use - no Daily Caffeine Use: 1 daily  Illicit Drug Use - no  Review of Systems  The patient denies anorexia, fever, weight loss, weight gain, vision loss, decreased hearing, hoarseness, chest pain, syncope, dyspnea on exertion, peripheral edema, prolonged cough, headaches, hemoptysis, abdominal pain, melena, hematochezia, severe indigestion/heartburn, hematuria, incontinence, genital sores, muscle weakness, suspicious skin lesions, transient blindness, difficulty walking, depression, unusual weight change, abnormal bleeding, enlarged lymph nodes, angioedema, breast masses, and testicular masses.    Physical Exam  General:  Well-developed,well-nourished,in no acute distress; alert,appropriate and cooperative throughout examination Head:  Normocephalic and atraumatic without obvious abnormalities. No apparent alopecia or balding. Eyes:  pupils equal and pupils round.   Ears:  R ear normal and L ear normal.   Nose:  no external deformity and no nasal discharge.   Mouth:  TMJ pain in the right jaw Neck:  supple.   Lungs:  normal respiratory effort and no wheezes.   Heart:  normal rate and regular rhythm.   Abdomen:  soft, non-tender, and normal bowel  sounds.   Msk:  normal ROM and no joint tenderness.   Extremities:  trace left pedal edema and trace right pedal edema.   Skin:  pt has red skin over upper chest, bilateral , blanching and bilaterla ( symetrical) he has puritic raise rash on right thigh Cervical Nodes:  No lymphadenopathy noted Psych:  Oriented X3, good eye contact, and not anxious appearing.     Impression & Recommendations:  Problem # 1:  URTICARIA (ICD-708.9) could the be a medication or an id reaction  will try 14 days of diflucan  Problem # 2:  DIAB W/RENAL MANIFESTS TYPE II/UNS NOT UNCNTRL (ICD-250.40) will resum the medications The following medications were removed from the medication  list:    Benazepril Hcl 20 Mg Tabs (Benazepril hcl) .[name]..[name] Two by mouth daily  ( total of 40) His updated medication list for this problem includes:    Aspirin 81 Mg Tabs (Aspirin) ..... One tablet by mouth once daily    Kombiglyze Xr 2.09-998 Mg Xr24h-tab (Saxagliptin-metformin) ..... One by mouth daily    Glipizide 10 Mg Xr24h-tab (Glipizide) ..... One daily    Benazepril Hcl 40 Mg Tabs (Benazepril hcl) ..... One by mouth daily  Problem # 3:  TEMPOROMANDIBULAR JOINT DISORDER (ICD-524.60) right sided jaw pain  Complete Medication List: 1)  Aspirin 81 Mg Tabs (Aspirin) .... One tablet by mouth once daily 2)  Kombiglyze Xr 2.09-998 Mg Xr24h-tab (Saxagliptin-metformin) .... One by mouth daily 3)  Metoprolol Tartrate 100 Mg Tabs (Metoprolol tartrate) .... Once daily 4)  Crestor 20 Mg Tabs (Rosuvastatin calcium) .... Once daily 5)  Onetouch Ultra Test Strp (Glucose blood) .... Use as directed before meals 6)  Glipizide 10 Mg Xr24h-tab (Glipizide) .... One daily 7)  Furosemide 20 Mg Tabs (Furosemide) .... One by mouth daily 8)  Onetouch Ultra Test Strp (Glucose blood) .... Use two times a day as directed 9)  Onetouch Finepoint Lancets Misc (Lancets) .... Use as directed 10)  Benazepril Hcl 40 Mg Tabs (Benazepril hcl) .... One by mouth daily 11)  Clotrimazole-betamethasone 1-0.05 % Crea (Clotrimazole-betamethasone) .... Apply to rash two times a day 12)  Fluconazole 100 Mg Tabs (Fluconazole) .... One by mouth daily for 14 days  Patient Instructions: 1)  Please schedule a follow-up appointment in 2 months. Prescriptions: FLUCONAZOLE 100 MG TABS (FLUCONAZOLE) one by mouth daily for 14 days  #14 x 0   Entered and Authorized by:   Stacie Glaze MD   Signed by:   Stacie Glaze MD on 11/22/2009   Method used:   Electronically to        [facility]* (retail)       [address]       [place], [place]  [address]       Ph: [phone]       Fax: [phone]   RxID:    [id number] KOMBIGLYZE XR 2.09-998 MG XR24H-TAB (SAXAGLIPTIN-METFORMIN) one by mouth daily  #90 x 3   Entered and Authorized by:   Stacie Glaze MD   Signed by:   Stacie Glaze MD on 11/22/2009   Method used:   Faxed to ...       [facility] (mail-order)       [address] [place], [place]  [address]       Ph: [phone]       Fax: [phone]   RxID:   [id number] KOMBIGLYZE XR 2.09-998 MG XR24H-TAB (SAXAGLIPTIN-METFORMIN) one  by mouth daily  #90 x 3   Entered and Authorized by:   Stacie Glaze MD   Signed by:   Stacie Glaze MD on 11/22/2009   Method used:   Print then Give to Patient   RxID:   [id number]

## 2010-06-24 NOTE — Progress Notes (Signed)
Summary: results and advice needed  Phone Note Call from Patient Call back at Home Phone [phone]   Caller: Patient---voice mail on yesterday @ 4:36 pm Reason for Call: Acute Illness Summary of Call: needs xray results. stomach and chest are still hurting. Initial call taken by: Warnell Forester,  March 14, 2010 8:09 AM  Follow-up for Phone Call        xray neg but continues to c/o of pain- ov with dr Kirtland Bouchard today Follow-up by: Willy Eddy, LPN,  March 14, 2010 8:24 AM

## 2010-06-24 NOTE — Letter (Signed)
Summary: New Patient letter  [facility]  [address] [place], [place] [address]   Phone: [phone]  Fax: [phone]       06/14/2009 MRN: [id number]  Dean Murphy [address] [place], [place]  [address]  Dear Dean Murphy,  Welcome to the Gastroenterology Division at [facility].    You are scheduled to see Dr.  Claudette Head on Thursday, 07/11/09 at 9:00 a.m. on the 3rd floor at [facility], [address]  We ask that you try to arrive at our office 15 minutes prior to your appointment time to allow for check-in.  We would like you to complete the enclosed self-administered evaluation form prior to your visit and bring it with you on the day of your appointment.  We will review it with you.  Also, please bring a complete list of all your medications or, if you prefer, bring the medication bottles and we will list them.  Please bring your insurance card so that we may make a copy of it.  If your insurance requires a referral to see a specialist, please bring your referral form from your primary care physician.  Co-payments are due at the time of your visit and may be paid by cash, check or credit card.     Your office visit will consist of a consult with your physician (includes a physical exam), any laboratory testing he/she may order, scheduling of any necessary diagnostic testing (e.g. x-ray, ultrasound, CT-scan), and scheduling of a procedure (e.g. Endoscopy, Colonoscopy) if required.  Please allow enough time on your schedule to allow for any/all of these possibilities.    If you cannot keep your appointment, please call [phone] to cancel or reschedule prior to your appointment date.  This allows [place] the opportunity to schedule an appointment for another patient in need of care.  If you do not cancel or reschedule by 5 p.m. the business day prior to your appointment date, you will be charged a $50.00 late cancellation/no-show fee.    Thank you for  choosing [facility] for your medical needs.  We appreciate the opportunity to care for you.  Please visit [place] at our website  to learn more about our practice.                     Sincerely,                                                             The Gastroenterology Division

## 2010-06-24 NOTE — Assessment & Plan Note (Signed)
Summary: check prostate/bmw   Vital Signs:  Patient profile:   77 year old male Height:      71 inches Weight:      227 pounds BMI:     31.77 Temp:     98.7 degrees F oral Pulse rate:   76 / minute Resp:     14 per minute BP sitting:   150 / 80  (left arm)  Vitals Entered By: Willy Eddy, LPN (July 31, 452 4:21 PM) CC: c/o "feeling like  brick" inlower abd with nl u rine and bm-some testicular pain, Hypertension Management   Primary Care Provider:  Darryll Capers, MD   CC:  c/o "feeling like  brick" inlower abd with nl u rine and bm-some testicular pain and Hypertension Management.  History of Present Illness: Had a cold for two weeks with the cough he has developed a pain in  the left lower quadrant with increased pain and gas some radiation to the testicle ( left) he states that it feels like he has a brink in his left side has been somewhat constipated for nthe last two days but up to know he hs been regular daily with normal color to stool.   Hypertension History:      He denies headache, chest pain, palpitations, dyspnea with exertion, orthopnea, PND, peripheral edema, visual symptoms, neurologic problems, syncope, and side effects from treatment.        Positive major cardiovascular risk factors include male age 45 years old or older, diabetes, hyperlipidemia, and hypertension.  Negative major cardiovascular risk factors include non-tobacco-user status.     Preventive Screening-Counseling & Management  Alcohol-Tobacco     Smoking Status: quit  Problems Prior to Update: 1)  Diverticulosis-colon  (ICD-562.10) 2)  Screening Colorectal-cancer  (ICD-V76.51) 3)  Chronic Kidney Disease Stage II (MILD)  (ICD-585.2) 4)  Chronic Prostatitis  (ICD-601.1) 5)  Advef, Drug/medicinal/biological Subst Nos  (ICD-995.20) 6)  Family History Diabetes 1st Degree Relative  (ICD-V18.0) 7)  Family History of Cad Male 1st Degree Relative <50  (ICD-V17.3) 8)  Osteoarthritis   (ICD-715.90) 9)  Hypertension  (ICD-401.9) 10)  Hyperlipidemia  (ICD-272.4) 11)  Gerd  (ICD-530.81) 12)  Diabetes Mellitus, Type II  (ICD-250.00)  Current Problems (verified): 1)  Diverticulosis-colon  (ICD-562.10) 2)  Screening Colorectal-cancer  (ICD-V76.51) 3)  Chronic Kidney Disease Stage II (MILD)  (ICD-585.2) 4)  Chronic Prostatitis  (ICD-601.1) 5)  Advef, Drug/medicinal/biological Subst Nos  (ICD-995.20) 6)  Family History Diabetes 1st Degree Relative  (ICD-V18.0) 7)  Family History of Cad Male 1st Degree Relative <50  (ICD-V17.3) 8)  Osteoarthritis  (ICD-715.90) 9)  Hypertension  (ICD-401.9) 10)  Hyperlipidemia  (ICD-272.4) 11)  Gerd  (ICD-530.81) 12)  Diabetes Mellitus, Type II  (ICD-250.00)  Medications Prior to Update: 1)  Aspirin 81 Mg  Tabs (Aspirin) .... One Tablet By Mouth Once Daily 2)  Avandia 8 Mg Tabs (Rosiglitazone Maleate) .... 1/2 Once Daily 3)  Lotrel 5-20 Mg Caps (Amlodipine Besy-Benazepril Hcl) .... Once Daily 4)  Metformin Hcl 500 Mg Tb24 (Metformin Hcl) .... Once Daily 5)  Nabumetone 750 Mg Tabs (Nabumetone) .... Take 1 Tablet By Mouth Twice A Day 6)  Metoprolol Tartrate 100 Mg  Tabs (Metoprolol Tartrate) .... Once Daily 7)  Crestor 20 Mg  Tabs (Rosuvastatin Calcium) .... Once Daily 8)  Onetouch Ultra Test   Strp (Glucose Blood) .... Use As Directed Before Meals  Current Medications (verified): 1)  Aspirin 81 Mg  Tabs (Aspirin) .... One  Tablet By Mouth Once Daily 2)  Avandia 8 Mg Tabs (Rosiglitazone Maleate) .... 1/2 Once Daily 3)  Lotrel 5-20 Mg Caps (Amlodipine Besy-Benazepril Hcl) .... Once Daily 4)  Metformin Hcl 500 Mg Tb24 (Metformin Hcl) .... Once Daily 5)  Nabumetone 750 Mg Tabs (Nabumetone) .... Take 1 Tablet By Mouth Twice A Day 6)  Metoprolol Tartrate 100 Mg  Tabs (Metoprolol Tartrate) .... Once Daily 7)  Crestor 20 Mg  Tabs (Rosuvastatin Calcium) .... Once Daily 8)  Onetouch Ultra Test   Strp (Glucose Blood) .... Use As Directed Before  Meals  Allergies (verified): No Known Drug Allergies  Past History:  Family History: Last updated: 07/11/2009 Family History of CAD Male 1st degree relative <50 Family History Diabetes 1st degree relative No FH of Colon Cancer:  Social History: Last updated: 07/11/2009 Retired Married 3 childern Former Smoker in high school Alcohol Use - no Daily Caffeine Use: 1 daily  Illicit Drug Use - no  Risk Factors: Exercise: yes (03/16/2007)  Risk Factors: Smoking Status: quit (07/31/2009)  Past medical, surgical, family and social histories (including risk factors) reviewed, and no changes noted (except as noted below).  Past Medical History: Reviewed history from 07/11/2009 and no changes required. Diabetes mellitus, type II GERD Hyperlipidemia Hypertension Osteoarthritis Diverticulosis Hemorrhoids Hyperplastic Colon Polyps  Past Surgical History: Reviewed history from 12/13/2006 and no changes required. Total knee replacement bilateral Coronary artery bypass graft x 5 1989 Cholecystectomy Tonsillectomy  Family History: Reviewed history from 07/11/2009 and no changes required. Family History of CAD Male 1st degree relative <50 Family History Diabetes 1st degree relative No FH of Colon Cancer:  Social History: Reviewed history from 07/11/2009 and no changes required. Retired Married 3 childern Former Smoker in high school Alcohol Use - no Daily Caffeine Use: 1 daily  Illicit Drug Use - no  Review of Systems       The patient complains of abdominal pain.  The patient denies anorexia, fever, weight loss, weight gain, vision loss, decreased hearing, hoarseness, chest pain, syncope, dyspnea on exertion, peripheral edema, prolonged cough, headaches, hemoptysis, melena, hematochezia, severe indigestion/heartburn, hematuria, incontinence, genital sores, muscle weakness, suspicious skin lesions, transient blindness, difficulty walking, depression, unusual weight  change, abnormal bleeding, enlarged lymph nodes, angioedema, and breast masses.         testicilar tenderness without masses  Physical Exam  General:  alert and pale.   Head:  normocephalic, atraumatic, and male-pattern balding.   Eyes:  pupils equal and pupils round.   Ears:  R ear normal and L ear normal.   Nose:  no external deformity and no nasal discharge.   Neck:  No deformities, masses, or tenderness noted. Lungs:  Normal respiratory effort, chest expands symmetrically. Lungs are clear to auscultation, no crackles or wheezes. Heart:  normal rate, regular rhythm, no gallop, and no rub.   Abdomen:  LUQ tenderness.   Prostate:  no gland enlargement and no induration.   Msk:  No deformity or scoliosis noted of thoracic or lumbar spine.     Impression & Recommendations:  Problem # 1:  DIVERTICULOSIS-COLON (ICD-562.10) with this hx and the left lower quadrant pain following a lon viral ilness i am worried about diverticulitis Colonoscopy:  Labs Reviewed: Hgb: 16.2 (07/26/2008)   Hct: 46.1 (07/26/2008)   WBC: 6.9 (07/26/2008)  Problem # 2:  DIVERTICULITIS OF COLON (ICD-562.11)  check CBC and tx wth cipri and flagyl with carefull monitering Colonoscopy:  Labs Reviewed: Hgb: 16.2 (07/26/2008)   Hct: 46.1 (07/26/2008)  WBC: 6.9 (07/26/2008)  Orders: Venipuncture (47829) TLB-CBC Platelet - w/Differential (85025-CBCD)  Complete Medication List: 1)  Aspirin 81 Mg Tabs (Aspirin) .... One tablet by mouth once daily 2)  Avandia 8 Mg Tabs (Rosiglitazone maleate) .... 1/2 once daily 3)  Lotrel 5-20 Mg Caps (Amlodipine besy-benazepril hcl) .... Once daily 4)  Metformin Hcl 500 Mg Tb24 (Metformin hcl) .... Once daily 5)  Nabumetone 750 Mg Tabs (Nabumetone) .... Take 1 tablet by mouth twice a day 6)  Metoprolol Tartrate 100 Mg Tabs (Metoprolol tartrate) .... Once daily 7)  Crestor 20 Mg Tabs (Rosuvastatin calcium) .... Once daily 8)  Onetouch Ultra Test Strp (Glucose blood) ....  Use as directed before meals 9)  Ciprofloxacin Hcl 500 Mg Tabs (Ciprofloxacin hcl) .... One by mouth two times a day 10)  Flagyl 250 Mg Tabs (Metronidazole) .... One by mouth qid for 10 days ( generic )  Hypertension Assessment/Plan:      The patient's hypertensive risk group is category C: Target organ damage and/or diabetes.  His calculated 10 year risk of coronary heart disease is 27 %.  Today's blood pressure is 150/80.  His blood pressure goal is < 130/80.  Patient Instructions: 1)  Take your antibiotic as prescribed until ALL of it is gone, but stop if you develop a rash or swelling and contact our office as soon as possible. Prescriptions: FLAGYL 250 MG TABS (METRONIDAZOLE) one by mouth QID for 10 days ( generic )  #40 x 0   Entered and Authorized by:   Stacie Glaze MD   Signed by:   Stacie Glaze MD on 07/31/2009   Method used:   Electronically to        [facility]* (retail)       [address]       [place], [place]  [address]       Ph: [phone]       Fax: [phone]   RxID:   [id number] CIPROFLOXACIN HCL 500 MG TABS (CIPROFLOXACIN HCL) one by mouth two times a day  #20 x 0   Entered and Authorized by:   Stacie Glaze MD   Signed by:   Stacie Glaze MD on 07/31/2009   Method used:   Electronically to        [facility]* (retail)       [address]       [place], [place]  [address]       Ph: [phone]       Fax: [phone]   RxID:   [id number]

## 2010-06-24 NOTE — Procedures (Signed)
Summary: Colonoscopy   Colonoscopy  Procedure date:  02/17/2002  Findings:      Results: Hemorrhoids.     Results: Diverticulosis.       Pathology:  Hyperplastic polyp.     Location:  [facility].    Procedures Next Due Date:    Colonoscopy: 01/2009  Colonoscopy  Procedure date:  02/17/2002  Findings:      Results: Hemorrhoids.     Results: Diverticulosis.       Pathology:  Hyperplastic polyp.     Location:  [facility].    Procedures Next Due Date:    Colonoscopy: 01/2009 Patient Name: Dean Murphy, [name] MRN:  Procedure Procedures: Colonoscopy CPT: 40981.    with polypectomy. CPT: A3573898.    with destruction of hemorrhoids CPT: 43934  Personnel: Endoscopist: Venita Lick. Russella Dar, MD, Clementeen Graham.  Exam Location: Exam performed in Outpatient Clinic. Outpatient  Patient Consent: Procedure, Alternatives, Risks and Benefits discussed, consent obtained, from patient. Consent was obtained by the RN.  Indications  Evaluation of: Positive fecal occult blood test  History  Pre-Exam Physical: Performed Feb 17, 2002. Cardio-pulmonary exam WNL. Rectal exam abnormal. Abdominal exam, Neurological exam, Mental status exam WNL. Abnormal PE findings include: ext. hem. tags.  Exam Exam: Extent of exam reached: Cecum, extent intended: Cecum.  The cecum was identified by appendiceal orifice and IC valve. Colon retroflexion performed. ASA Classification: II. Tolerance: good.  Monitoring: Pulse and BP monitoring, Oximetry used. Supplemental O2 given.  Colon Prep Used Golytely for colon prep. Prep results: good.  Sedation Meds: Patient assessed and found to be appropriate for moderate (conscious) sedation. Fentanyl 100 mcg. given IV. Versed 9 mg. given IV.  Findings - DIVERTICULOSIS: Sigmoid Colon. Not bleeding. ICD9: Diverticulosis: 562.10.  NORMAL EXAM: Cecum to Descending Colon.  POLYP: Sigmoid Colon, Maximum size: 8 mm. pedunculated polyp. Procedure:   snare with cautery, removed, retrieved, Polyp sent to pathology. ICD9: Colon Polyps: 211.3.  HEMORRHOIDS: Internal. Size: Medium. Not bleeding. Not thrombosed. ICD9: Hemorrhoids, Internal: 455.0.  - Injection: Rectum. 3 ccs. Outcome: successful. Comments: 23.4% saline .   Assessment  Diagnoses: 211.3: Colon Polyps.  562.10: Diverticulosis.  455.0: Hemorrhoids, Internal.   Events  Unplanned Interventions: No intervention was required.  Unplanned Events: There were no complications. Plans  Post Exam Instructions: No aspirin or non-steroidal containing medications: 2 weeks.  Medication Plan: Await pathology. Continue current medications.  Patient Education: Patient given standard instructions for: Polyps. Diverticulosis. Hemorrhoids.  Disposition: After procedure patient sent to recovery. After recovery patient sent home.  Scheduling/Referral: Colonoscopy, to [facility] T. Russella Dar, MD, [facility], if polyp is adenomatous, around Feb 17, 2005.  Primary Care Provider, to Stacie Glaze, MD,    This report was created from the original endoscopy report, which was reviewed and signed by the above listed endoscopist.    cc: Stacie Glaze, MD

## 2010-06-24 NOTE — Progress Notes (Signed)
  Phone Note Call from Patient   Caller: fyi for dr Lovell Sheehan- condition after injection Reason for Call: Acute Illness Details for Reason: fyi for dr Lovell Sheehan- conditin o after injection Summary of Call: pt calling stating the area injected was better, but still tender.  fyi for dr Lovell Sheehan Initial call taken by: Willy Eddy, LPN,  April 24, 2010 11:57 AM  Follow-up for Phone Call        call again next week if the pain has not resolved Follow-up by: Stacie Glaze MD,  April 25, 2010 8:23 AM  Additional Follow-up for Phone Call Additional follow up Details #1::        pt informed Additional Follow-up by: Willy Eddy, LPN,  April 25, 2010 8:27 AM

## 2010-06-24 NOTE — Assessment & Plan Note (Signed)
Summary: 2 month rov/jr   Vital Signs:  Patient profile:   77 year old male Height:      71 inches Weight:      202 pounds BMI:     28.28 Temp:     98.2 degrees F oral Pulse rate:   72 / minute Resp:     14 per minute BP sitting:   160 / 72  (left arm)  Vitals Entered By: Willy Eddy, LPN (April 22, 2010 10:14 AM) CC: continues to c/o luq pain and he states it feels like it stops up there when he eats and then late on it goes on Is Patient Diabetic? Yes Did you bring your meter with you today? No   Primary Care Provider:  Darryll Capers, MD   CC:  continues to c/o luq pain and he states it feels like it stops up there when he eats and then late on it goes on.  History of Present Illness: Still has persistant left upper quadrant pain rates this pain as occasionally 8-9 /10 it awoke him from sleep "felt like a knife" bowels are tight has a sense of earlt saiety "prunes help this" treated empiricaly for diverticulosis in sept  labs normal no rash  but notes that the skin turns red after a hot bath   Preventive Screening-Counseling & Management  Alcohol-Tobacco     Smoking Status: quit     Tobacco Counseling: not indicated; no tobacco use  Current Problems (verified): 1)  Rib Pain, Left Sided  (ICD-786.50) 2)  Abdominal Pain, Left Lower Quadrant  (ICD-789.04) 3)  Diverticulitis of Colon  (ICD-562.11) 4)  Temporomandibular Joint Disorder  (ICD-524.60) 5)  Unspecified Hepatitis  (ICD-573.3) 6)  Diab W/renal Manifests Type Ii/uns Not Uncntrl  (ICD-250.40) 7)  Edema  (ICD-782.3) 8)  Weight Loss  (ICD-783.21) 9)  Urticaria  (ICD-708.9) 10)  Fatigue  (ICD-780.79) 11)  Diverticulosis-colon  (ICD-562.10) 12)  Screening Colorectal-cancer  (ICD-V76.51) 13)  Chronic Kidney Disease Stage II (MILD)  (ICD-585.2) 14)  Chronic Prostatitis  (ICD-601.1) 15)  Advef, Drug/medicinal/biological Subst Nos  (ICD-995.20) 16)  Family History Diabetes 1st Degree Relative   (ICD-V18.0) 17)  Family History of Cad Male 1st Degree Relative <50  (ICD-V17.3) 18)  Osteoarthritis  (ICD-715.90) 19)  Hypertension  (ICD-401.9) 20)  Hyperlipidemia  (ICD-272.4) 21)  Gerd  (ICD-530.81)  Current Medications (verified): 1)  Aspirin 81 Mg  Tabs (Aspirin) .... One Tablet By Mouth Once Daily 2)  Kombiglyze Xr 2.09-998 Mg Xr24h-Tab (Saxagliptin-Metformin) .... One By Mouth Daily 3)  Metoprolol Tartrate 100 Mg  Tabs (Metoprolol Tartrate) .... Once Daily 4)  Glipizide 10 Mg Xr24h-Tab (Glipizide) .... One Daily 5)  Furosemide 20 Mg Tabs (Furosemide) .... One By Mouth Daily 6)  Onetouch Ultra Test  Strp (Glucose Blood) .... Use Two Times A Day As Directed 7)  Onetouch Finepoint Lancets  Misc (Lancets) .... Use As Directed 8)  Benazepril Hcl 40 Mg Tabs (Benazepril Hcl) .... One By Mouth Daily 9)  Clotrimazole-Betamethasone 1-0.05 % Crea (Clotrimazole-Betamethasone) .... Apply To Rash Two Times A Day  Allergies (verified): No Known Drug Allergies   Impression & Recommendations:  Problem # 1:  RIB PAIN, LEFT SIDED (ICD-786.50) informed consent, 40 depo and 1/2 cc 1% lidocaine injection in the anterior 8th rib costochondral junction there is point tenderness at the costochodril junction  I gave a point injection and if the pain stops will order a bone scan  Orders: Trigger Point Injection Single Tendon Origin/Insertion (  59563) Depo- Medrol 40mg  (J1030)  Problem # 2:  ABDOMINAL TENDERNESS LEFT UPPER QUADRANT (ICD-789.62)  CT of the abdomin and pelvice for persistant pain , weight loss and now early sataity place on PPI to rule our ulcer dz  Orders: Radiology Referral (Radiology)  Problem # 3:  EARLY SATIETY (ICD-780.94)  CT of abdomin and pelvis  Orders: TLB-CBC Platelet - w/Differential (85025-CBCD) T-CEA (87564-33295) Radiology Referral (Radiology)  Problem # 4:  DIAB W/RENAL MANIFESTS TYPE II/UNS NOT UNCNTRL (ICD-250.40)  add amitiza in case this is  gastroparesis  and for the sense of constipation the CBGs are in the 80"s could this represent gastroparesis?  consideration for an empting study? His updated medication list for this problem includes:    Aspirin 81 Mg Tabs (Aspirin) ..... One tablet by mouth once daily    Kombiglyze Xr 2.09-998 Mg Xr24h-tab (Saxagliptin-metformin) ..... One by mouth daily    Glipizide 10 Mg Xr24h-tab (Glipizide) ..... One daily    Benazepril Hcl 40 Mg Tabs (Benazepril hcl) ..... One by mouth daily  Orders: TLB-A1C / Hgb A1C (Glycohemoglobin) (83036-A1C) TLB-Microalbumin/Creat Ratio, Urine (82043-MALB)  Labs Reviewed: Creat: 1.1 (10/01/2009)     Last Eye Exam: diabetic retinopathy (07/23/2008) Reviewed HgBA1c results: 7.0 (10/01/2009)  6.6 (02/11/2009)  Complete Medication List: 1)  Aspirin 81 Mg Tabs (Aspirin) .... One tablet by mouth once daily 2)  Kombiglyze Xr 2.09-998 Mg Xr24h-tab (Saxagliptin-metformin) .... One by mouth daily 3)  Metoprolol Tartrate 100 Mg Tabs (Metoprolol tartrate) .... Once daily 4)  Glipizide 10 Mg Xr24h-tab (Glipizide) .... One daily 5)  Furosemide 20 Mg Tabs (Furosemide) .... One by mouth daily 6)  Onetouch Ultra Test Strp (Glucose blood) .... Use two times a day as directed 7)  Onetouch Finepoint Lancets Misc (Lancets) .... Use as directed 8)  Benazepril Hcl 40 Mg Tabs (Benazepril hcl) .... One by mouth daily 9)  Clotrimazole-betamethasone 1-0.05 % Crea (Clotrimazole-betamethasone) .... Apply to rash two times a day 10)  Amitiza 8 Mcg Caps (Lubiprostone) .... One by mouth daily  Other Orders: Venipuncture (18841) TLB-BMP (Basic Metabolic Panel-BMET) (80048-METABOL)  Patient Instructions: 1)  Please schedule a follow-up appointment in 1 month.   Orders Added: 1)  Est. Patient Level IV [66063] 2)  TLB-A1C / Hgb A1C (Glycohemoglobin) [83036-A1C] 3)  TLB-Microalbumin/Creat Ratio, Urine [82043-MALB] 4)  TLB-CBC Platelet - w/Differential [85025-CBCD] 5)  T-CEA  [82378-23770] 6)  Venipuncture [36415] 7)  TLB-BMP (Basic Metabolic Panel-BMET) [80048-METABOL] 8)  Radiology Referral [Radiology] 9)  Trigger Point Injection Single Tendon Origin/Insertion [20551] 10)  Depo- Medrol 40mg  [J1030]  Appended Document: 2 month rov/jr     Allergies: No Known Drug Allergies  Review of Systems       The patient complains of weight loss and abdominal pain.  The patient denies anorexia, fever, weight gain, vision loss, decreased hearing, hoarseness, chest pain, syncope, dyspnea on exertion, peripheral edema, prolonged cough, headaches, hemoptysis, melena, hematochezia, severe indigestion/heartburn, hematuria, incontinence, genital sores, muscle weakness, suspicious skin lesions, transient blindness, difficulty walking, depression, unusual weight change, abnormal bleeding, enlarged lymph nodes, angioedema, breast masses, and testicular masses.    Physical Exam  General:  overweight-appearing.  overweight-appearing.   Head:  Normocephalic and atraumatic without obvious abnormalities. No apparent alopecia or balding. Eyes:  pupils equal and pupils round.   Ears:  R ear normal and L ear normal.   Nose:  no external deformity and no nasal discharge.   Mouth:  Oral mucosa and oropharynx without lesions or exudates.  Teeth  in good repair. Neck:  No deformities, masses, or tenderness noted. Chest Wall:  chest wall tenderness and costochondrial tenderness.   Lungs:  Normal respiratory effort, chest expands symmetrically. Lungs are clear to auscultation, no crackles or wheezes. Heart:  Normal rate and regular rhythm. S1 and S2 normal without gallop, murmur, click, rub or other extra sounds. Abdomen:  distended and LUQ tenderness.   Msk:  no joint swelling, no joint warmth, and no redness over joints.   Pulses:  R and L carotid,radial,femoral,dorsalis pedis and posterior tibial pulses are full and equal bilaterally Extremities:  No clubbing, cyanosis, edema, or  deformity noted with normal full range of motion of all joints.   Neurologic:  No cranial nerve deficits noted. Station and gait are normal. Plantar reflexes are down-going bilaterally. DTRs are symmetrical throughout. Sensory, motor and coordinative functions appear intact.   Impression & Recommendations:  Problem # 1:  WEIGHT LOSS, RECENT (ICD-783.21) screening labs  Problem # 2:  RIB PAIN, LEFT SIDED (ICD-786.50) pointinjection  Complete Medication List: 1)  Aspirin 81 Mg Tabs (Aspirin) .... One tablet by mouth once daily 2)  Kombiglyze Xr 2.09-998 Mg Xr24h-tab (Saxagliptin-metformin) .... One by mouth daily 3)  Metoprolol Tartrate 100 Mg Tabs (Metoprolol tartrate) .... Once daily 4)  Glipizide 10 Mg Xr24h-tab (Glipizide) .... One daily 5)  Furosemide 20 Mg Tabs (Furosemide) .... One by mouth daily 6)  Onetouch Ultra Test Strp (Glucose blood) .... Use two times a day as directed 7)  Onetouch Finepoint Lancets Misc (Lancets) .... Use as directed 8)  Benazepril Hcl 40 Mg Tabs (Benazepril hcl) .... One by mouth daily 9)  Clotrimazole-betamethasone 1-0.05 % Crea (Clotrimazole-betamethasone) .... Apply to rash two times a day 10)  Amitiza 8 Mcg Caps (Lubiprostone) .... One by mouth daily  Appended Document: Orders Update    Clinical Lists Changes  Orders: Added new Service order of Specimen Handling (11914) - Signed      Appended Document: 2 month rov/jr     Allergies: No Known Drug Allergies   Impression & Recommendations:  Problem # 1:  RIB PAIN, LEFT SIDED (ICD-786.50) the pain in the rib area is most likely a pulled intercostal muscle  Problem # 2:  OTHER SPECIFIED SITES OF SPRAINS AND STRAINS (ICD-848.8) the pain in the anterior chest wall is mostlike intercostal mucles tear or strain the point injection will be bothj diagnostic and treatment. It the pain persists a bone scane would be recommended  Complete Medication List: 1)  Aspirin 81 Mg Tabs (Aspirin)  .... One tablet by mouth once daily 2)  Kombiglyze Xr 2.09-998 Mg Xr24h-tab (Saxagliptin-metformin) .... One by mouth daily 3)  Metoprolol Tartrate 100 Mg Tabs (Metoprolol tartrate) .... Once daily 4)  Glipizide 10 Mg Xr24h-tab (Glipizide) .... One daily 5)  Furosemide 20 Mg Tabs (Furosemide) .... One by mouth daily 6)  Onetouch Ultra Test Strp (Glucose blood) .... Use two times a day as directed 7)  Onetouch Finepoint Lancets Misc (Lancets) .... Use as directed 8)  Benazepril Hcl 40 Mg Tabs (Benazepril hcl) .... One by mouth daily 9)  Clotrimazole-betamethasone 1-0.05 % Crea (Clotrimazole-betamethasone) .... Apply to rash two times a day 10)  Amitiza 8 Mcg Caps (Lubiprostone) .... One by mouth daily

## 2010-06-26 NOTE — Assessment & Plan Note (Signed)
Summary: 1 month rov/njr   Vital Signs:  Patient profile:   77 year old male Height:      71 inches Weight:      203 pounds Temp:     97.9 degrees F oral Pulse rate:   68 / minute BP sitting:   138 / 66  (left arm) Cuff size:   regular  Vitals Entered By: Kathlene November LPN (May 29, 2010 12:06 PM) CC: check up., Hypertension Management   Primary Care Provider:  Darryll Capers, MD   CC:  check up. and Hypertension Management.  History of Present Illness: discussion of the colon and the diverticulosis  ( constipation) he has been better with less pain added amitiza fro constipation and increased fiber this is though to be related to the DM with motility issues the blood glucoses have been better with MA fasting of 92 this AM  Hypertension History:      He denies headache, chest pain, palpitations, dyspnea with exertion, orthopnea, PND, peripheral edema, visual symptoms, neurologic problems, syncope, and side effects from treatment.        Positive major cardiovascular risk factors include male age 38 years old or older, diabetes, hyperlipidemia, and hypertension.  Negative major cardiovascular risk factors include non-tobacco-user status.     Preventive Screening-Counseling & Management  Alcohol-Tobacco     Smoking Status: never  Problems Prior to Update: 1)  Other Specified Sites of Sprains and Strains  (ICD-848.8) 2)  Weight Loss, Recent  (ICD-783.21) 3)  Early Satiety  (ICD-780.94) 4)  Rib Pain, Left Sided  (ICD-786.50) 5)  Abdominal Tenderness Left Upper Quadrant  (ICD-789.62) 6)  Diverticulitis of Colon  (ICD-562.11) 7)  Temporomandibular Joint Disorder  (ICD-524.60) 8)  Unspecified Hepatitis  (ICD-573.3) 9)  Diab W/renal Manifests Type Ii/uns Not Uncntrl  (ICD-250.40) 10)  Edema  (ICD-782.3) 11)  Weight Loss  (ICD-783.21) 12)  Urticaria  (ICD-708.9) 13)  Fatigue  (ICD-780.79) 14)  Diverticulosis-colon  (ICD-562.10) 15)  Screening Colorectal-cancer   (ICD-V76.51) 16)  Chronic Kidney Disease Stage II (MILD)  (ICD-585.2) 17)  Chronic Prostatitis  (ICD-601.1) 18)  Advef, Drug/medicinal/biological Subst Nos  (ICD-995.20) 19)  Family History Diabetes 1st Degree Relative  (ICD-V18.0) 20)  Family History of Cad Male 1st Degree Relative <50  (ICD-V17.3) 21)  Osteoarthritis  (ICD-715.90) 22)  Hypertension  (ICD-401.9) 23)  Hyperlipidemia  (ICD-272.4) 24)  Gerd  (ICD-530.81)  Medications Prior to Update: 1)  Aspirin 81 Mg  Tabs (Aspirin) .... One Tablet By Mouth Once Daily 2)  Kombiglyze Xr 2.09-998 Mg Xr24h-Tab (Saxagliptin-Metformin) .... One By Mouth Daily 3)  Metoprolol Tartrate 100 Mg  Tabs (Metoprolol Tartrate) .... Once Daily 4)  Glipizide 10 Mg Xr24h-Tab (Glipizide) .... One Daily 5)  Furosemide 20 Mg Tabs (Furosemide) .... One By Mouth Daily 6)  Onetouch Ultra Test  Strp (Glucose Blood) .... Use Two Times A Day As Directed 7)  Onetouch Finepoint Lancets  Misc (Lancets) .... Use As Directed 8)  Benazepril Hcl 40 Mg Tabs (Benazepril Hcl) .... One By Mouth Daily 9)  Clotrimazole-Betamethasone 1-0.05 % Crea (Clotrimazole-Betamethasone) .... Apply To Rash Two Times A Day 10)  Amitiza 8 Mcg Caps (Lubiprostone) .... One By Mouth Daily  Current Medications (verified): 1)  Aspirin 81 Mg  Tabs (Aspirin) .... One Tablet By Mouth Once Daily 2)  Kombiglyze Xr 2.09-998 Mg Xr24h-Tab (Saxagliptin-Metformin) .... One By Mouth Daily 3)  Metoprolol Tartrate 100 Mg  Tabs (Metoprolol Tartrate) .... Once Daily 4)  Glipizide 10 Mg  Xr24h-Tab (Glipizide) .... One Daily 5)  Furosemide 20 Mg Tabs (Furosemide) .... One By Mouth Daily 6)  Onetouch Ultra Test  Strp (Glucose Blood) .... Use Two Times A Day As Directed 7)  Onetouch Finepoint Lancets  Misc (Lancets) .... Use As Directed 8)  Benazepril Hcl 40 Mg Tabs (Benazepril Hcl) .... One By Mouth Daily 9)  Clotrimazole-Betamethasone 1-0.05 % Crea (Clotrimazole-Betamethasone) .... Apply To Rash Two Times A  Day 10)  Amitiza 8 Mcg Caps (Lubiprostone) .... One By Mouth Daily  Allergies (verified): No Known Drug Allergies  Comments:  Nurse/Medical Assistant:  The patient's medications and allergies were reviewed with the patient and were updated in the Medication and Allergy Lists. Kathlene November LPN (May 29, 2010 12:07 PM)  Past History:  Family History: Last updated: 07/11/2009 Family History of CAD Male 1st degree relative <50 Family History Diabetes 1st degree relative No FH of Colon Cancer:  Social History: Last updated: 07/11/2009 Retired Married 3 childern Former Smoker in high school Alcohol Use - no Daily Caffeine Use: 1 daily  Illicit Drug Use - no  Risk Factors: Exercise: yes (03/16/2007)  Risk Factors: Smoking Status: never (05/29/2010)  Past medical, surgical, family and social histories (including risk factors) reviewed, and no changes noted (except as noted below).  Past Medical History: Reviewed history from 07/11/2009 and no changes required. Diabetes mellitus, type II GERD Hyperlipidemia Hypertension Osteoarthritis Diverticulosis Hemorrhoids Hyperplastic Colon Polyps  Past Surgical History: Reviewed history from 12/13/2006 and no changes required. Total knee replacement bilateral Coronary artery bypass graft x 5 1989 Cholecystectomy Tonsillectomy  Family History: Reviewed history from 07/11/2009 and no changes required. Family History of CAD Male 1st degree relative <50 Family History Diabetes 1st degree relative No FH of Colon Cancer:  Social History: Reviewed history from 07/11/2009 and no changes required. Retired Married 3 childern Former Smoker in high school Alcohol Use - no Daily Caffeine Use: 1 daily  Illicit Drug Use - no Smoking Status:  never  Review of Systems  The patient denies anorexia, fever, weight loss, weight gain, vision loss, decreased hearing, hoarseness, chest pain, syncope, dyspnea on exertion, peripheral  edema, prolonged cough, headaches, hemoptysis, abdominal pain, melena, hematochezia, severe indigestion/heartburn, hematuria, incontinence, genital sores, muscle weakness, suspicious skin lesions, transient blindness, difficulty walking, depression, unusual weight change, abnormal bleeding, enlarged lymph nodes, angioedema, and breast masses.    Physical Exam  General:  overweight-appearing.  overweight-appearing.   Head:  Normocephalic and atraumatic without obvious abnormalities. No apparent alopecia or balding. Eyes:  pupils equal and pupils round.   Ears:  R ear normal and L ear normal.   Nose:  no external deformity and no nasal discharge.   Mouth:  Oral mucosa and oropharynx without lesions or exudates.  Teeth in good repair. Neck:  No deformities, masses, or tenderness noted. Lungs:  normal respiratory effort and no wheezes.   Heart:  Normal rate and regular rhythm. S1 and S2 normal without gallop, murmur, click, rub or other extra sounds. Abdomen:  distended and LUQ tenderness.     Impression & Recommendations:  Problem # 1:  DIVERTICULITIS OF COLON (ICD-562.11) increased fiber  and amitiza 8 mg BID Colonoscopy:  CT reviewed  Labs Reviewed: Hgb: 14.9 (04/22/2010)   Hct: 42.9 (04/22/2010)   WBC: 6.9 (04/22/2010)  Problem # 2:  EARLY SATIETY (ICD-780.94) due to motilty improved with the amitiza  Problem # 3:  DIAB W/RENAL MANIFESTS TYPE II/UNS NOT UNCNTRL (ICD-250.40) Assessment: Unchanged stable His updated medication list  for this problem includes:    Aspirin 81 Mg Tabs (Aspirin) ..... One tablet by mouth once daily    Kombiglyze Xr 2.09-998 Mg Xr24h-tab (Saxagliptin-metformin) ..... One by mouth daily    Glipizide 10 Mg Xr24h-tab (Glipizide) ..... One daily    Benazepril Hcl 40 Mg Tabs (Benazepril hcl) ..... One by mouth daily  Labs Reviewed: Creat: 1.3 (04/22/2010)     Last Eye Exam: diabetic retinopathy (07/23/2008) Reviewed HgBA1c results: 5.7 (04/22/2010)  7.0  (10/01/2009)  Problem # 4:  CHRONIC KIDNEY DISEASE STAGE II (MILD) (ICD-585.2) Assessment: Unchanged  Labs Reviewed: BUN: 23 (04/22/2010)   Cr: 1.3 (04/22/2010)    Hgb: 14.9 (04/22/2010)   Hct: 42.9 (04/22/2010)   Ca++: 9.5 (04/22/2010)    TP: 6.8 (02/21/2010)   Alb: 4.2 (02/21/2010)  Complete Medication List: 1)  Aspirin 81 Mg Tabs (Aspirin) .... One tablet by mouth once daily 2)  Kombiglyze Xr 2.09-998 Mg Xr24h-tab (Saxagliptin-metformin) .... One by mouth daily 3)  Metoprolol Tartrate 100 Mg Tabs (Metoprolol tartrate) .... Once daily 4)  Glipizide 10 Mg Xr24h-tab (Glipizide) .... One daily 5)  Furosemide 20 Mg Tabs (Furosemide) .... One by mouth daily 6)  Onetouch Ultra Test Strp (Glucose blood) .... Use two times a day as directed 7)  Onetouch Finepoint Lancets Misc (Lancets) .... Use as directed 8)  Benazepril Hcl 40 Mg Tabs (Benazepril hcl) .... One by mouth daily 9)  Clotrimazole-betamethasone 1-0.05 % Crea (Clotrimazole-betamethasone) .... Apply to rash two times a day 10)  Amitiza 8 Mcg Caps (Lubiprostone) .... One by mouth two times a day  Hypertension Assessment/Plan:      The patient's hypertensive risk group is category C: Target organ damage and/or diabetes.  His calculated 10 year risk of coronary heart disease is 22 %.  Today's blood pressure is 138/66.  His blood pressure goal is < 130/80.  Patient Instructions: 1)  medicare welness exam  in 3 month  come fasting Prescriptions: AMITIZA 8 MCG CAPS (LUBIPROSTONE) one by mouth two times a day  #180 x 3   Entered and Authorized by:   Stacie Glaze MD   Signed by:   Stacie Glaze MD on 05/29/2010   Method used:   Faxed to ...       [facility] (mail-order)       [address] [place], [place]  [address]       Ph: [phone]       Fax: [phone]   RxID:   [id number]    Orders Added: 1)  Est. Patient Level IV [74259]

## 2010-08-12 LAB — DIFFERENTIAL
Basophils Absolute: 0 10*3/uL (ref 0.0–0.1)
Basophils Relative: 0 % (ref 0–1)
Eosinophils Absolute: 0.2 10*3/uL (ref 0.0–0.7)
Eosinophils Relative: 2 % (ref 0–5)
Lymphs Abs: 0.7 10*3/uL (ref 0.7–4.0)

## 2010-08-12 LAB — COMPREHENSIVE METABOLIC PANEL
AST: 35 U/L (ref 0–37)
Albumin: 2.2 g/dL — ABNORMAL LOW (ref 3.5–5.2)
Alkaline Phosphatase: 139 U/L — ABNORMAL HIGH (ref 39–117)
Alkaline Phosphatase: 155 U/L — ABNORMAL HIGH (ref 39–117)
BUN: 28 mg/dL — ABNORMAL HIGH (ref 6–23)
BUN: 39 mg/dL — ABNORMAL HIGH (ref 6–23)
CO2: 23 mEq/L (ref 19–32)
Chloride: 108 mEq/L (ref 96–112)
Chloride: 109 mEq/L (ref 96–112)
Chloride: 112 mEq/L (ref 96–112)
Creatinine, Ser: 1.13 mg/dL (ref 0.4–1.5)
GFR calc Af Amer: 53 mL/min — ABNORMAL LOW (ref >60)
GFR calc Af Amer: 53 mL/min — ABNORMAL LOW (ref >60)
GFR calc non Af Amer: 44 mL/min — ABNORMAL LOW (ref >60)
Glucose, Bld: 106 mg/dL — ABNORMAL HIGH (ref 70–99)
Glucose, Bld: 232 mg/dL — ABNORMAL HIGH (ref 70–99)
Potassium: 4.4 mEq/L (ref 3.5–5.1)
Potassium: 5.2 mEq/L — ABNORMAL HIGH (ref 3.5–5.1)
Sodium: 137 mEq/L (ref 135–145)
Sodium: 140 mEq/L (ref 135–145)
Total Bilirubin: 0.8 mg/dL (ref 0.3–1.2)
Total Protein: 5.5 g/dL — ABNORMAL LOW (ref 6.0–8.3)
Total Protein: 6 g/dL (ref 6.0–8.3)

## 2010-08-12 LAB — GLUCOSE, CAPILLARY
Glucose-Capillary: 113 mg/dL — ABNORMAL HIGH (ref 70–99)
Glucose-Capillary: 195 mg/dL — ABNORMAL HIGH (ref 70–99)
Glucose-Capillary: 219 mg/dL — ABNORMAL HIGH (ref 70–99)
Glucose-Capillary: 282 mg/dL — ABNORMAL HIGH (ref 70–99)
Glucose-Capillary: 323 mg/dL — ABNORMAL HIGH (ref 70–99)
Glucose-Capillary: 99 mg/dL (ref 70–99)

## 2010-08-12 LAB — CBC
HCT: 36.1 % — ABNORMAL LOW (ref 39.0–52.0)
MCHC: 33.5 g/dL (ref 30.0–36.0)
Platelets: 252 10*3/uL (ref 150–400)
RBC: 3.88 MIL/uL — ABNORMAL LOW (ref 4.22–5.81)
RDW: 15.3 % (ref 11.5–15.5)

## 2010-08-12 LAB — CMV ANTIBODY, IGG (EIA): CMV Ab - IgG: 3.2 IU/mL — ABNORMAL HIGH (ref <0.4)

## 2010-08-12 LAB — EPSTEIN-BARR VIRUS VCA ANTIBODY PANEL
EBV EA IgG: 1.22 {ISR} — ABNORMAL HIGH
EBV NA IgG: 0.9 {ISR}
EBV VCA IgM: 0.14 {ISR}

## 2010-08-12 LAB — HEPATITIS PANEL, ACUTE
HCV Ab: NEGATIVE
Hep B C IgM: NEGATIVE

## 2010-08-12 LAB — ANA: Anti Nuclear Antibody(ANA): NEGATIVE

## 2010-08-12 LAB — PROTIME-INR: INR: 1.1 (ref 0.00–1.49)

## 2010-08-12 LAB — CK: Total CK: 19 U/L (ref 7–232)

## 2010-08-12 LAB — HSV(HERPES SMPLX)ABS-I+II(IGG+IGM)-BLD
Herpes Simplex Vrs I + II Ab, IgG: 24 IV — ABNORMAL HIGH
Herpes Simplex Vrs I&II-IgM Ab (EIA): 1.62 INDEX — ABNORMAL HIGH

## 2010-08-12 LAB — CMV IGM: CMV IgM: 27 AU/mL (ref <30.0)

## 2010-09-16 ENCOUNTER — Encounter: Payer: Self-pay | Admitting: Internal Medicine

## 2010-09-24 ENCOUNTER — Encounter: Payer: Self-pay | Admitting: Internal Medicine

## 2010-10-07 ENCOUNTER — Ambulatory Visit (INDEPENDENT_AMBULATORY_CARE_PROVIDER_SITE_OTHER): Payer: Medicare Other | Admitting: Internal Medicine

## 2010-10-07 ENCOUNTER — Encounter: Payer: Self-pay | Admitting: Internal Medicine

## 2010-10-07 VITALS — BP 130/80 | HR 54 | Temp 98.1°F | Resp 16 | Ht 71.0 in | Wt 208.0 lb

## 2010-10-07 DIAGNOSIS — L219 Seborrheic dermatitis, unspecified: Secondary | ICD-10-CM

## 2010-10-07 DIAGNOSIS — N182 Chronic kidney disease, stage 2 (mild): Secondary | ICD-10-CM

## 2010-10-07 DIAGNOSIS — N411 Chronic prostatitis: Secondary | ICD-10-CM

## 2010-10-07 DIAGNOSIS — Z0000 Encounter for general adult medical examination without abnormal findings: Secondary | ICD-10-CM

## 2010-10-07 DIAGNOSIS — E785 Hyperlipidemia, unspecified: Secondary | ICD-10-CM

## 2010-10-07 DIAGNOSIS — I10 Essential (primary) hypertension: Secondary | ICD-10-CM

## 2010-10-07 DIAGNOSIS — K219 Gastro-esophageal reflux disease without esophagitis: Secondary | ICD-10-CM

## 2010-10-07 DIAGNOSIS — E1129 Type 2 diabetes mellitus with other diabetic kidney complication: Secondary | ICD-10-CM

## 2010-10-07 DIAGNOSIS — M7989 Other specified soft tissue disorders: Secondary | ICD-10-CM

## 2010-10-07 DIAGNOSIS — N401 Enlarged prostate with lower urinary tract symptoms: Secondary | ICD-10-CM

## 2010-10-07 LAB — POCT URINALYSIS DIPSTICK
Bilirubin, UA: NEGATIVE
Ketones, UA: NEGATIVE
Leukocytes, UA: NEGATIVE
Protein, UA: NEGATIVE
Spec Grav, UA: 1.015
pH, UA: 5

## 2010-10-07 LAB — BASIC METABOLIC PANEL
BUN: 23 mg/dL (ref 6–23)
CO2: 30 mEq/L (ref 19–32)
Calcium: 10.1 mg/dL (ref 8.4–10.5)
GFR: 58.3 mL/min — ABNORMAL LOW (ref >60.00)
Glucose, Bld: 69 mg/dL — ABNORMAL LOW (ref 70–99)
Sodium: 149 mEq/L — ABNORMAL HIGH (ref 135–145)

## 2010-10-07 LAB — LIPID PANEL
Cholesterol: 236 mg/dL — ABNORMAL HIGH (ref 0–200)
HDL: 42.9 mg/dL (ref >39.00)
VLDL: 22.8 mg/dL (ref 0.0–40.0)

## 2010-10-07 LAB — CBC WITH DIFFERENTIAL/PLATELET
Basophils Relative: 0.5 % (ref 0.0–3.0)
Eosinophils Relative: 1.3 % (ref 0.0–5.0)
HCT: 44.4 % (ref 39.0–52.0)
Hemoglobin: 15.1 g/dL (ref 13.0–17.0)
Lymphs Abs: 2 10*3/uL (ref 0.7–4.0)
MCV: 93.7 fl (ref 78.0–100.0)
Monocytes Absolute: 0.6 10*3/uL (ref 0.1–1.0)
Monocytes Relative: 7.2 % (ref 3.0–12.0)
Neutro Abs: 5.2 10*3/uL (ref 1.4–7.7)
WBC: 7.9 10*3/uL (ref 4.5–10.5)

## 2010-10-07 LAB — HEPATIC FUNCTION PANEL
AST: 23 U/L (ref 0–37)
Albumin: 4 g/dL (ref 3.5–5.2)
Alkaline Phosphatase: 67 U/L (ref 39–117)
Total Protein: 6.6 g/dL (ref 6.0–8.3)

## 2010-10-07 LAB — TSH: TSH: 1.53 u[IU]/mL (ref 0.35–5.50)

## 2010-10-07 MED ORDER — BENAZEPRIL HCL 40 MG PO TABS: 40.0000 mg | ORAL_TABLET | Freq: Every day | ORAL | Status: DC

## 2010-10-07 MED ORDER — METOPROLOL TARTRATE 100 MG PO TABS: 100.0000 mg | ORAL_TABLET | Freq: Every day | ORAL | Status: DC

## 2010-10-07 MED ORDER — LUBIPROSTONE 8 MCG PO CAPS: 8.0000 ug | ORAL_CAPSULE | Freq: Every day | ORAL | Status: DC

## 2010-10-07 MED ORDER — GLIPIZIDE 10 MG PO TABS: 10.0000 mg | ORAL_TABLET | Freq: Every day | ORAL | Status: DC

## 2010-10-07 MED ORDER — FUROSEMIDE 20 MG PO TABS: 20.0000 mg | ORAL_TABLET | Freq: Two times a day (BID) | ORAL | Status: DC

## 2010-10-07 MED ORDER — SAXAGLIPTIN-METFORMIN ER 2.5-1000 MG PO TB24: 1.0000 | ORAL_TABLET | Freq: Every day | ORAL | Status: DC

## 2010-10-07 NOTE — Progress Notes (Signed)
Subjective:    Dean Murphy is a 77 y.o. male who presents for Medicare Annual/Subsequent preventive examination.  He also has multiple complaints one is that itching rash inside the right ear too is increased edema of his lower extremity. Preventive Screening-Counseling & Management  Tobacco History  Smoking status  . Former Smoker  Smokeless tobacco  . Not on file  Comment: smoked in high school    Problems Prior to Visit 1.   Current Problems (verified) Patient Active Problem List  Diagnoses  . DIAB W/RENAL MANIFESTS TYPE II/UNS NOT UNCNTRL  . HYPERLIPIDEMIA  . HYPERTENSION  . TEMPOROMANDIBULAR JOINT DISORDER  . GERD  . DIVERTICULOSIS-COLON  . DIVERTICULITIS OF COLON  . UNSPECIFIED HEPATITIS  . CHRONIC KIDNEY DISEASE STAGE II (MILD)  . CHRONIC PROSTATITIS  . URTICARIA  . OSTEOARTHRITIS  . FATIGUE  . EARLY SATIETY  . EDEMA  . Loss of weight  . RIB PAIN, LEFT SIDED  . ABDOMINAL TENDERNESS LEFT UPPER QUADRANT  . OTHER SPECIFIED SITES OF SPRAINS AND STRAINS  . ADVEF, DRUG/MEDICINAL/BIOLOGICAL SUBST NOS    Medications Prior to Visit Current Outpatient Prescriptions on File Prior to Visit  Medication Sig Dispense Refill  . aspirin 81 MG tablet Take 81 mg by mouth daily.        [name] glucose blood (ONE TOUCH TEST STRIPS) test strip 1 each by Other route 2 (two) times daily as needed. Use as instructed       . lubiprostone (AMITIZA) 8 MCG capsule Take 8 mcg by mouth daily with breakfast.        . ONE TOUCH LANCETS MISC by Does not apply route as directed.        [name] DISCONTD: benazepril (LOTENSIN) 40 MG tablet Take 40 mg by mouth daily.        [name] DISCONTD: furosemide (LASIX) 20 MG tablet Take 20 mg by mouth 2 (two) times daily.        [name] DISCONTD: glipiZIDE (GLUCOTROL) 10 MG tablet Take 10 mg by mouth daily.        [name] DISCONTD: metoprolol (LOPRESSOR) 100 MG tablet Take 100 mg by mouth daily.        [name] DISCONTD: Saxagliptin-Metformin (KOMBIGLYZE XR) 2.09-998 MG TB24 Take by  mouth daily.        [name] DISCONTD: clotrimazole-betamethasone (LOTRISONE) cream Apply topically 2 (two) times daily.          Current Medications (verified) Current Outpatient Prescriptions  Medication Sig Dispense Refill  . aspirin 81 MG tablet Take 81 mg by mouth daily.        . benazepril (LOTENSIN) 40 MG tablet Take 1 tablet (40 mg total) by mouth daily.  90 tablet  3  . furosemide (LASIX) 20 MG tablet Take 1 tablet (20 mg total) by mouth 2 (two) times daily.  180 tablet  3  . glipiZIDE (GLUCOTROL) 10 MG tablet Take 1 tablet (10 mg total) by mouth daily.  90 tablet  3  . glucose blood (ONE TOUCH TEST STRIPS) test strip 1 each by Other route 2 (two) times daily as needed. Use as instructed       . lubiprostone (AMITIZA) 8 MCG capsule Take 8 mcg by mouth daily with breakfast.        . metoprolol (LOPRESSOR) 100 MG tablet Take 1 tablet (100 mg total) by mouth daily.  90 tablet  3  . ONE TOUCH LANCETS MISC by Does not apply route as directed.        [name]  Saxagliptin-Metformin (KOMBIGLYZE XR) 2.09-998 MG TB24 Take 1 tablet by mouth daily.  90 tablet  3  . DISCONTD: benazepril (LOTENSIN) 40 MG tablet Take 40 mg by mouth daily.        [name] DISCONTD: furosemide (LASIX) 20 MG tablet Take 20 mg by mouth 2 (two) times daily.        [name] DISCONTD: glipiZIDE (GLUCOTROL) 10 MG tablet Take 10 mg by mouth daily.        [name] DISCONTD: metoprolol (LOPRESSOR) 100 MG tablet Take 100 mg by mouth daily.        [name] DISCONTD: Saxagliptin-Metformin (KOMBIGLYZE XR) 2.09-998 MG TB24 Take by mouth daily.        [name] DISCONTD: clotrimazole-betamethasone (LOTRISONE) cream Apply topically 2 (two) times daily.           Allergies (verified) Review of patient's allergies indicates not on file.   PAST HISTORY  Family History Family History  Problem Relation Age of Onset  . Diabetes    . Heart disease    . Cancer      Social History History  Substance Use Topics  . Smoking status: Former Games developer  . Smokeless tobacco: Not on  file   Comment: smoked in high school  . Alcohol Use: No    Are there smokers in your home (other than you)?  No  Risk Factors Current exercise habits: Home exercise routine includes stretching.  Dietary issues discussed: salt reduction  Cardiac risk factors: advanced age (older than 62 for men, 33 for women), diabetes mellitus, dyslipidemia, hypertension and male gender.  Depression Screen (Note: if answer to either of the following is "Yes", a more complete depression screening is indicated)   Q1: Over the past two weeks, have you felt down, depressed or hopeless? No  Q2: Over the past two weeks, have you felt little interest or pleasure in doing things? No  Have you lost interest or pleasure in daily life? No  Do you often feel hopeless? No  Do you cry easily over simple problems? No  Activities of Daily Living In your present state of health, do you have any difficulty performing the following activities?:  Driving? No Managing money?  No Feeding yourself? No Getting from bed to chair? No Climbing a flight of stairs? No Preparing food and eating?: No Bathing or showering? No Getting dressed: No Getting to the toilet? No Using the toilet:No Moving around from place to place: No In the past year have you fallen or had a near fall?:No   Are you sexually active?  Yes  Do you have more than one partner?  No  Hearing Difficulties: No Do you often ask people to speak up or repeat themselves? No Do you experience ringing or noises in your ears? No Do you have difficulty understanding soft or whispered voices? No   Do you feel that you have a problem with memory? No  Do you often misplace items? No  Do you feel safe at home?  No  Cognitive Testing  Alert? Yes  Normal Appearance?Yes  Oriented to person? Yes  Place? Yes   Time? Yes  Recall of three objects?  Yes  Can perform simple calculations? Yes  Displays appropriate judgment?Yes  Can read the correct time from a  watch face?Yes   Advanced Directives have been discussed with the patient? Yes   List the Names of Other Physician/Practitioners you currently use: 1.  Sabino Donovan any recent Medical Services you may have received from  other than [facility] providers in the past year (date may be approximate).  Immunization History  Administered Date(s) Administered  . Influenza Whole 03/16/2007, 01/23/2010  . Pneumococcal Polysaccharide 09/24/2009  . Td 07/26/2008    Screening Tests Health Maintenance  Topic Date Due  . Zostavax  03/31/1989  . Influenza Vaccine  02/23/2011  . Colonoscopy  02/18/2012  . Tetanus/tdap  07/27/2018  . Pneumococcal Polysaccharide Vaccine Age 12 And Over  Completed    All answers were reviewed with the patient and necessary referrals were made:  Carrie Mew   10/07/2010   History reviewed: allergies, current medications, past family history, past medical history, past social history, past surgical history and problem list  Review of Systems A comprehensive review of systems was negative.    Objective:     Vision by Snellen chart: right eye:20/20, left eye:20/30 Blood pressure 130/80, pulse 54, temperature 98.1 F (36.7 C), resp. rate 16, height 5\' 11"  (1.803 m), weight 208 lb (94.348 kg). Body mass index is 29.01 kg/(m^2).  BP 130/80  Pulse 54  Temp 98.1 F (36.7 C)  Resp 16  Ht 5\' 11"  (1.803 m)  Wt 208 lb (94.348 kg)  BMI 29.01 kg/m2  General Appearance:    Alert, cooperative, no distress, appears stated age  Head:    Normocephalic, without obvious abnormality, atraumatic  Eyes:    PERRL, conjunctiva/corneas clear, EOM's intact, fundi    benign, both eyes       Ears:    Normal TM's and external ear canals, both ears  Nose:   Nares normal, septum midline, mucosa normal, no drainage    or sinus tenderness  Throat:   Lips, mucosa, and tongue normal; teeth and gums normal  Neck:   Supple, symmetrical, trachea midline, no adenopathy;       thyroid:   No enlargement/tenderness/nodules; no carotid   bruit or JVD  Back:     Symmetric, no curvature, ROM normal, no CVA tenderness  Lungs:     Clear to auscultation bilaterally, respirations unlabored  Chest wall:    No tenderness or deformity  Heart:    Regular rate and rhythm, S1 and S2 normal, no murmur, rub   or gallop  Abdomen:     Soft, non-tender, bowel sounds active all four quadrants,    no masses, no organomegaly  Genitalia:    Normal male without lesion, discharge or tenderness  Rectal:    Normal tone, normal prostate, no masses or tenderness;   guaiac negative stool  Extremities:   Extremities normal, atraumatic, no cyanosis or edema  Pulses:   2+ and symmetric all extremities  Skin:   Skin color, texture, turgor normal, no rashes or lesions  Lymph nodes:   Cervical, supraclavicular, and axillary nodes normal  Neurologic:   CNII-XII intact. Normal strength, sensation and reflexes      throughout       Assessment:      Patient presents for yearly preventative medicine examination.   all immunizations and health maintenance protocols were reviewed with the patient and they are up to date with these protocols.   screening laboratory values were reviewed with the patient including screening of hyperlipidemia PSA renal function and hepatic function.   There medications past medical history social history problem list and allergies were reviewed in detail.   Goals were established with regard to weight loss exercise diet in compliance with medications      Plan:     During the course of  the visit the patient was educated and counseled about appropriate screening and preventive services including:    Prostate cancer screening  Nutrition counseling   Advanced directives: power of attorney for healthcare on file  Diet review for nutrition referral? Yes ____  Not Indicated _x___   Patient Instructions (the written plan) was given to the patient.  Medicare  Attestation I have personally reviewed: The patient's medical and social history Their use of alcohol, tobacco or illicit drugs Their current medications and supplements The patient's functional ability including ADLs,fall risks, home safety risks, cognitive, and hearing and visual impairment Diet and physical activities Evidence for depression or mood disorders  The patient's weight, height, BMI, and visual acuity have been recorded in the chart.  I have made referrals, counseling, and provided education to the patient based on review of the above and I have provided the patient with a written personalized care plan for preventive services.   The patient is also an adult-onset diabetic will measure an A1c counseling was given about monitoring and diet we reviewed the need to limit carbohydrates.  The patient has moderate swelling of his feet at the end of the day we discussed salt limitation in his diet.  The patient has some itching in the inside of his left ear on examination is mild seborrheic dermatitis recommend using of low potency corticosteroid cream twice a day as needed.  Patient has history of renal insufficiency and we will obtain a basic metabolic panel.    Carrie Mew   10/07/2010

## 2010-10-08 LAB — LDL CHOLESTEROL, DIRECT: Direct LDL: 198.6 mg/dL

## 2010-10-10 NOTE — H&P (Signed)
NAME:  Dean Murphy, Dean Murphy NO.:  [id number]   MEDICAL RECORD NO.:  [id number]          PATIENT TYPE:  INP   LOCATION:  NA                           FACILITY:  [facility]   PHYSICIAN:  Ollen Gross, M.D.    DATE OF BIRTH:  July 20, 1926   DATE OF ADMISSION:  08/21/2004  DATE OF DISCHARGE:                                HISTORY & PHYSICAL   CHIEF COMPLAINT:  Right knee pain.   HISTORY OF PRESENT ILLNESS:  The patient is a 77 year old male who has been  seen by Dr. Homero Fellers Aluisio ongoing for right knee pain. He has a longstanding  progressive history with knee pain over the past year. The right knee has  become more intolerable to the point where it is hurting him all the time.  It hurts throughout the day as well as at night. It is limiting what he can  and cannot do. He has previously undergone a successful left total knee and  now presents for consideration of a right total knee. X-ray shows severe  significant end-stage arthritis. Felt to be an appropriate candidate.   ALLERGIES:  No known drug allergies.   CURRENT MEDICATIONS:  1.  Metformin 500 mg.  2.  Avandia 4 mg.  3.  Toprol XL 100 mg.  4.  Crestor 10 mg q.d.  5.  Lotrel 5/20 mg.  6.  Nabumetone 750 mg.  7.  Aspirin, stopped prior to surgery.   PAST MEDICAL HISTORY:  1.  History of myocardial infarction x 2.  2.  Coronary arterial disease.  3.  Hiatal hernia.  4.  Reflux disease.  5.  History of renal calculi.  6.  Noninsulin-dependent diabetes mellitus.   PAST SURGICAL HISTORY:  1.  Open heart surgery in 1989.  2.  Gallbladder surgery in 1997.   SOCIAL HISTORY:  Married, retired, nonsmoker. No alcohol. Has three sons.  Wife will be assisting with care after surgery.   FAMILY HISTORY:  Significant for diabetes with his mother.   REVIEW OF SYMPTOMS:  GENERAL:  No fevers, chills, night sweats.  NEUROLOGICAL:  No seizures, syncope, or paralysis. RESPIRATORY:  No  shortness of breath, productive cough,  or hemoptysis. CARDIOVASCULAR:  History of MI x 2; however, he denies any chest pain, angina, or orthopnea.  GI:  History of hiatal hernia and reflux. No nausea, vomiting, diarrhea, or  constipation. GU:  No dysuria, hematuria, or discharge. MUSCULOSKELETAL:  Right knee found in the history of present illness.   PHYSICAL EXAMINATION:  VITAL SIGNS:  Pulse 60, respirations 12, blood  pressure 160/78.  GENERAL:  A 77 year old white male, well-developed, well-nourished in no  acute distress. Alert, oriented, cooperative, and pleasant. Appears to be an  excellent historian.  HEENT:  Normocephalic and atraumatic. Pupils round and reactive. Oropharynx  clear. EOMs intact.  NECK:  Supple. No carotid bruits.  CHEST:  Clear anterior and posterior chest walls. No rhonchi, rales, or  wheezing.  HEART:  Regular rhythm. He does have a systolic murmur best noted at  pulmonic point. S1 and S2 is noted.  ABDOMEN:  Soft and nontender. Bowel sounds present.  RECTAL:  Not done, not pertinent to present illness.  BREASTS:  Not done, not pertinent to present illness.  GENITALIA:  Not done, not pertinent to present illness.  EXTREMITIES:  Right knee shows passive range of motion of 5 to 110 degrees.  Marked crepitus noted on passive range of motion with a slight varus  malalignment deformity. No instability. No effusion. Tender medially more so  than laterally.   IMPRESSION:  1.  Osteoarthritis right knee.  2.  Coronary arterial disease.  3.  History of myocardial infarction x 2.  4.  Status post coronary artery bypass grafting in 1989.  5.  Hiatal hernia.  6.  Reflux disease.  7.  Hemorrhoids.  8.  History of renal calculi.  9.  Noninsulin-dependent diabetes mellitus.   PLAN:  The patient is admitted to [facility] to undergo right  total knee arthroplasty. The surgery will be performed by Dr. Ollen Gross.  The patient's medical physician is Dr. Stacie Glaze. Dr. Stacie Glaze   will be notified of the room on admission and be consulted if needed for  medical assistance with the patient throughout the hospital course.      ALP/MEDQ  D:  08/20/2004  T:  08/20/2004  Job:  [id number]   cc:   Ollen Gross, M.D.  [facility]  [address]  [address]  [place]  [place] [address]  Fax: [phone]

## 2010-10-10 NOTE — Discharge Summary (Signed)
NAME:  Dean Murphy, Dean Murphy NO.:  [id number]   MEDICAL RECORD NO.:  [id number]          PATIENT TYPE:  INP   LOCATION:  0467                         FACILITY:  [facility]   PHYSICIAN:  Ollen Gross, M.D.    DATE OF BIRTH:  05-03-1927   DATE OF ADMISSION:  08/21/2004  DATE OF DISCHARGE:  08/25/2004                                 DISCHARGE SUMMARY   ADMITTING DIAGNOSES:  1.  Osteoarthritis right knee.  2.  Coronary arterial disease.  3.  History of myocardial infarction x2.  4.  Status post coronary artery bypass grafting in 1989.  5.  Hiatal hernia.  6.  Reflux disease.  7.  Hemorrhoids.  8.  History of renal calculi.  9.  Non-insulin-dependent diabetes mellitus.   DISCHARGE DIAGNOSES:  1.  Osteoarthritis right knee status post right total knee arthroplasty.  2.  Mild postoperative blood loss anemia.  3.  Mild postoperative hyponatremia.  4.  Coronary arterial disease.  5.  History of myocardial infarction x2.  6.  Status post coronary artery bypass grafting in 1989.  7.  Hiatal hernia.  8.  Reflux disease.  9.  Hemorrhoids.  10. History of renal calculi.  11. Non-insulin-dependent diabetes mellitus.   PROCEDURE:  On August 21, 2004 - right total knee. Surgeon:  Dr. Homero Fellers  Aluisio. Assistant:  Ralene Bathe, P.A.-C. Anesthesia:  General.  Postoperative Marcaine pain pump. Minimal blood loss. Hemovac drain x1.  Tourniquet time 61 minutes at 300 mmHg.   CONSULTS:  None.   BRIEF HISTORY:  [name] is a 77 year old male with severe end-stage  arthritis of the right knee with intractable pain. He has failed  nonoperative management. Had a previous successful left total knee, now  presents for a right total knee.   LABORATORY DATA:  Preoperative CBC:  Hemoglobin 16.2, hematocrit of 46.8.  Differential within normal limits. Postoperative hemoglobin 12.7. Last noted  H&H 10.9 and 31.0. PT/PTT 12.1 and 24 respectively with an INR of 0.9.  Serial protimes followed.  Last noted PT/INR 18.7 and 1.9. Chem panel on  admission all within normal limits. Serial BMETs were followed. Glucose 124  to 192, back down to 153. Sodium did drop from 139 to 132. Urinalysis  preoperative negative. Blood group/type AB positive.   EKG on August 19, 2004:  Normal sinus rhythm, first degree A-V block,  inferior infarct age undetermined, T wave abnormalities, consider lateral  ischemia. No old tracing to compare; confirmed by Dr. Orpah Cobb.   Chest x-ray dated August 19, 2004:  Nodular density left base. Repeat with  nipple markers. Chest special view on August 21, 2004:  Density, question  previous correlates with the presence of left nipple. I do not think there  is an active cardiopulmonary process.   HOSPITAL COURSE:  The patient was subsequently admitted to the hospital,  underwent the above procedure without complication. The patient tolerated  the procedure well, later sent to the recovery room and then to the  orthopedic floor. Given PCA and p.o. analgesia for pain control following  surgery. Was tolerating CPM  and medications quite well on day #1. Had a  fairly good night. Had two episodes of nausea, however, was given  antiemetics which did help. PCA was discontinued on day #1 along with  continue the fluids at a lower rate, antiemetics as needed. By day #2 the  patient was doing much better. The nausea had resolved after PCA had been  removed. IV and Foley were discontinued at that time. Dressing was changed,  incision was healing well. He started to get up out of bed and by day #2 was  already up ambulating approximately 140 feet that morning. By day #3,  continued to do well, and was up ambulating approximately 400 feet, had been  placed on Coumadin for DVT prophylaxis, continued to do well through the  weekend, and by August 25, 2004 tolerating therapy, tolerating medications,  and was discharged home.   DISCHARGE MEDICATIONS AND PLAN:  1.  Discharge home  on August 25, 2004.  2.  Discharge diagnoses:  Please see above.  3.  Discharge medications:  Coumadin, Percocet, Robaxin.  4.  Follow-up:  2 weeks.  5.  Activity:  Weightbearing as tolerated. Home health PT, home health      nursing, total knee protocol, may start showering.  6.  Diet:  Cardiac diet, diabetic diet.   DISPOSITION:  Home.   CONDITION ON DISCHARGE:  Improved.      ALP/MEDQ  D:  10/01/2004  T:  10/01/2004  Job:  [id number]

## 2010-10-10 NOTE — Op Note (Signed)
NAME:  Dean Murphy, Dean Murphy NO.:  [id number]   MEDICAL RECORD NO.:  [id number]          PATIENT TYPE:  INP   LOCATION:  X004                         FACILITY:  [facility]   PHYSICIAN:  Ollen Gross, M.D.    DATE OF BIRTH:  Feb 19, 1927   DATE OF PROCEDURE:  08/21/2004  DATE OF DISCHARGE:                                 OPERATIVE REPORT   PREOPERATIVE DIAGNOSIS:  Osteoarthritis, right knee.   POSTOPERATIVE DIAGNOSIS:  Osteoarthritis, right knee.   PROCEDURE:  Right total knee arthroplasty.   SURGEON:  Ollen Gross, M.D.   ASSISTANT:  French Ana A. Shuford, P.A.-C.   ANESTHESIA:  General with postop Marcaine pain pump.   ESTIMATED BLOOD LOSS:  Minimal.   DRAINS:  Hemovac x1.   COMPLICATIONS:  None.   TOURNIQUET TIME:  Sixty-one minutes at 300 mmHg.   CONDITION:  Stable to the recovery room.   CLINICAL NOTE:  Dean Murphy is a 77 year old male with severe end-stage  arthritis of the right knee with intractable pain.  He has failed  nonoperative management and had a previous successful left total knee  arthroplasty.  He presents now for right total knee arthroplasty.   PROCEDURE IN DETAIL:  After successful administration of general anesthetic,  a tourniquet is placed high on his right thigh, and his right lower  extremity prepped and draped in the usual sterile fashion.  The extremity is  wrapped in esmarch, the knee flexed, and the tourniquet inflated to 300  mmHg.  A standard midline incision was made with a 10 blade through the  subcutaneous tissue to the level of the extensor mechanism.  A fresh blade  is used to make a medial parapatellar arthrotomy, and then the soft tissue  over the proximal medial tibia is subperiosteally elevated to the joint line  with a knife and into the semimembranosus bursa with a Cobb elevator.  The  soft tissue over the proximal lateral tibia is also elevated with attention  being paid to avoid the patellar tendon on the tibial tubercle.   The patella  is everted, and the knee flexed to 90 degrees.  ACL and PCL removed.  The  drill is used to create a starting hole in the distal femur, and the canal  is irrigated.  A 5 degree right valgus alignment guide is placed, and  referencing off the posterior condyle, rotation is marked in a block pen to  remove 10 mm off the distal femur.  Distal femoral resection is made with an  oscillating saw.  The sizing block is placed, and size 5 is most  appropriate.  A size 5 cutting block is placed, and then the anterior and  posterior and chamfer cuts are made.  Rotation was marked off the  epicondylar axis.   The tibia is then subluxed forward, and the menisci are removed.  An  extramedullary tibial alignment guide is placed, referencing proximally at  the medial aspect of the tibial tubercle and distally along the second  metatarsal axis and tibial crest.  The block is penned to remove 10 mm  off  the nondeficient lateral side.  Tibial resection is made with an oscillating  saw.  The size 5 is the most appropriate tibial component, and the proximal  tibia is prepared with a modular drill and keel punch for the size 5.  Femoral preparation is completed with the intercondylar cut.   A size 5 posterior stabilized femoral trial with a size 5 mobile-bearing  tibial trial and a 10 mm posterior stabilized rotating platform insert trial  was placed.  With the 10, there is a tiny bit of varus and valgus play at  full extension, thus, we went to the 12.5, which allowed for full extension  with excellent balance throughout full range of motion.  The patella is then  everted, and thickness measured to be 26 mm.  Free-hand resection is taken  to 14 mm.  A 41 template is placed.  Lug holes are drilled.  The trial  patella is placed, and it tracks normally.  The osteophytes are then removed  off the posterior femur with a trial in place.  All trials were removed,  then the cut-bone surfaces are  prepared with pulsatile lavage.  Cement is  mixed, and once ready for implantation, a size 5 mobile-bearing tibial tray,  size 5 posterior stabilized femur, and 41 patella are cemented into place.  The patella is held with a clamp.  A trial 12.5 insert is placed.  The knee  held in full extension, and all extruded cement is removed.  Once the cement  is fully hardened, then the permanent 12.5 mm posterior stabilized rotating  platform insert is placed into the tibial tray.  The wound is copiously  irrigated with saline solution.  The extensor mechanism closed over a  Hemovac drain with interrupted #1 PDS.  Flexion against gravity is 135  degrees.  The tourniquet is released for a total time of 61 minutes.  The  subcu is closed with interrupted 2-0 Vicryl and the subcuticular with  running 4-0 Monocryl.  The catheter for the Marcaine pain pump is placed,  and the pump initiated.  The Hemovac is then hooked up to the bulb suction.  Steri-Strips and a bulky sterile dressing are applied.  He is placed into a  knee immobilizer, awakened and transported to recovery in stable condition.      FA/MEDQ  D:  08/21/2004  T:  08/21/2004  Job:  [id number]

## 2010-10-14 ENCOUNTER — Other Ambulatory Visit: Payer: Self-pay | Admitting: *Deleted

## 2010-12-24 ENCOUNTER — Other Ambulatory Visit: Payer: Self-pay | Admitting: *Deleted

## 2010-12-24 ENCOUNTER — Telehealth: Payer: Self-pay | Admitting: *Deleted

## 2010-12-24 DIAGNOSIS — G453 Amaurosis fugax: Secondary | ICD-10-CM

## 2010-12-24 NOTE — Telephone Encounter (Signed)
Per dr Lovell Sheehan- scheduled carotid doppler--pt informed

## 2010-12-31 ENCOUNTER — Observation Stay (HOSPITAL_COMMUNITY): Payer: Medicare Other

## 2010-12-31 ENCOUNTER — Emergency Department (HOSPITAL_COMMUNITY): Payer: Medicare Other

## 2010-12-31 ENCOUNTER — Inpatient Hospital Stay (HOSPITAL_COMMUNITY)
Admission: EM | Admit: 2010-12-31 | Discharge: 2011-01-08 | DRG: 253 | Disposition: A | Discharge status: Home / Self Care | Payer: Medicare Other | Attending: Vascular Surgery | Admitting: Vascular Surgery

## 2010-12-31 DIAGNOSIS — T502X5A Adverse effect of carbonic-anhydrase inhibitors, benzothiadiazides and other diuretics, initial encounter: Secondary | ICD-10-CM | POA: Diagnosis present

## 2010-12-31 DIAGNOSIS — I6529 Occlusion and stenosis of unspecified carotid artery: Secondary | ICD-10-CM | POA: Diagnosis present

## 2010-12-31 DIAGNOSIS — I059 Rheumatic mitral valve disease, unspecified: Secondary | ICD-10-CM | POA: Diagnosis present

## 2010-12-31 DIAGNOSIS — E119 Type 2 diabetes mellitus without complications: Secondary | ICD-10-CM | POA: Diagnosis present

## 2010-12-31 DIAGNOSIS — M1990 Unspecified osteoarthritis, unspecified site: Secondary | ICD-10-CM | POA: Diagnosis present

## 2010-12-31 DIAGNOSIS — N182 Chronic kidney disease, stage 2 (mild): Secondary | ICD-10-CM | POA: Diagnosis present

## 2010-12-31 DIAGNOSIS — I509 Heart failure, unspecified: Secondary | ICD-10-CM | POA: Diagnosis present

## 2010-12-31 DIAGNOSIS — I495 Sick sinus syndrome: Secondary | ICD-10-CM | POA: Diagnosis not present

## 2010-12-31 DIAGNOSIS — I440 Atrioventricular block, first degree: Secondary | ICD-10-CM | POA: Diagnosis present

## 2010-12-31 DIAGNOSIS — I6509 Occlusion and stenosis of unspecified vertebral artery: Secondary | ICD-10-CM | POA: Diagnosis present

## 2010-12-31 DIAGNOSIS — I2510 Atherosclerotic heart disease of native coronary artery without angina pectoris: Secondary | ICD-10-CM | POA: Diagnosis present

## 2010-12-31 DIAGNOSIS — I129 Hypertensive chronic kidney disease with stage 1 through stage 4 chronic kidney disease, or unspecified chronic kidney disease: Secondary | ICD-10-CM | POA: Diagnosis present

## 2010-12-31 DIAGNOSIS — N179 Acute kidney failure, unspecified: Secondary | ICD-10-CM | POA: Diagnosis present

## 2010-12-31 DIAGNOSIS — I658 Occlusion and stenosis of other precerebral arteries: Secondary | ICD-10-CM | POA: Diagnosis present

## 2010-12-31 DIAGNOSIS — T448X5A Adverse effect of centrally-acting and adrenergic-neuron-blocking agents, initial encounter: Secondary | ICD-10-CM | POA: Diagnosis not present

## 2010-12-31 DIAGNOSIS — R0789 Other chest pain: Principal | ICD-10-CM | POA: Diagnosis present

## 2010-12-31 DIAGNOSIS — E785 Hyperlipidemia, unspecified: Secondary | ICD-10-CM | POA: Diagnosis present

## 2010-12-31 DIAGNOSIS — Z7982 Long term (current) use of aspirin: Secondary | ICD-10-CM

## 2010-12-31 DIAGNOSIS — E875 Hyperkalemia: Secondary | ICD-10-CM | POA: Diagnosis not present

## 2010-12-31 DIAGNOSIS — I252 Old myocardial infarction: Secondary | ICD-10-CM

## 2010-12-31 DIAGNOSIS — Z79899 Other long term (current) drug therapy: Secondary | ICD-10-CM

## 2010-12-31 DIAGNOSIS — Z96659 Presence of unspecified artificial knee joint: Secondary | ICD-10-CM

## 2010-12-31 DIAGNOSIS — Z951 Presence of aortocoronary bypass graft: Secondary | ICD-10-CM

## 2010-12-31 DIAGNOSIS — K219 Gastro-esophageal reflux disease without esophagitis: Secondary | ICD-10-CM | POA: Diagnosis present

## 2010-12-31 HISTORY — PX: CAROTID ENDARTERECTOMY: SUR193

## 2010-12-31 LAB — CBC
HCT: 39.7 % (ref 39.0–52.0)
Hemoglobin: 14 g/dL (ref 13.0–17.0)
MCH: 31.4 pg (ref 26.0–34.0)
MCH: 31.8 pg (ref 26.0–34.0)
MCHC: 34.8 g/dL (ref 30.0–36.0)
MCHC: 35.3 g/dL (ref 30.0–36.0)
MCV: 90 fL (ref 78.0–100.0)
Platelets: 171 10*3/uL (ref 150–400)
RBC: 4.4 MIL/uL (ref 4.22–5.81)
RDW: 12.6 % (ref 11.5–15.5)

## 2010-12-31 LAB — DIFFERENTIAL
Eosinophils Absolute: 0.2 10*3/uL (ref 0.0–0.7)
Eosinophils Relative: 2 % (ref 0–5)
Lymphs Abs: 2.1 10*3/uL (ref 0.7–4.0)
Monocytes Absolute: 0.7 10*3/uL (ref 0.1–1.0)
Monocytes Relative: 9 % (ref 3–12)

## 2010-12-31 LAB — TROPONIN I
Troponin I: <0.3 ng/mL (ref <0.30)
Troponin I: <0.3 ng/mL (ref <0.30)

## 2010-12-31 LAB — BASIC METABOLIC PANEL
BUN: 39 mg/dL — ABNORMAL HIGH (ref 6–23)
Creatinine, Ser: 2.1 mg/dL — ABNORMAL HIGH (ref 0.50–1.35)
GFR calc Af Amer: 37 mL/min — ABNORMAL LOW (ref >60)
GFR calc non Af Amer: 30 mL/min — ABNORMAL LOW (ref >60)
Glucose, Bld: 78 mg/dL (ref 70–99)

## 2010-12-31 LAB — CK TOTAL AND CKMB (NOT AT ARMC): CK, MB: 5.3 ng/mL — ABNORMAL HIGH (ref 0.3–4.0)

## 2010-12-31 LAB — GLUCOSE, CAPILLARY: Glucose-Capillary: 102 mg/dL — ABNORMAL HIGH (ref 70–99)

## 2011-01-01 ENCOUNTER — Observation Stay (HOSPITAL_COMMUNITY): Payer: Medicare Other

## 2011-01-01 DIAGNOSIS — R079 Chest pain, unspecified: Secondary | ICD-10-CM

## 2011-01-01 DIAGNOSIS — I6381 Other cerebral infarction due to occlusion or stenosis of small artery: Secondary | ICD-10-CM

## 2011-01-01 DIAGNOSIS — I6529 Occlusion and stenosis of unspecified carotid artery: Secondary | ICD-10-CM

## 2011-01-01 HISTORY — DX: Other cerebral infarction due to occlusion or stenosis of small artery: I63.81

## 2011-01-01 LAB — BASIC METABOLIC PANEL
BUN: 32 mg/dL — ABNORMAL HIGH (ref 6–23)
CO2: 27 mEq/L (ref 19–32)
Calcium: 9.4 mg/dL (ref 8.4–10.5)
Chloride: 109 mEq/L (ref 96–112)
Creatinine, Ser: 1.55 mg/dL — ABNORMAL HIGH (ref 0.50–1.35)
Glucose, Bld: 95 mg/dL (ref 70–99)

## 2011-01-01 LAB — HEMOGLOBIN A1C: Mean Plasma Glucose: 143 mg/dL — ABNORMAL HIGH (ref <117)

## 2011-01-01 LAB — LIPID PANEL
Cholesterol: 212 mg/dL — ABNORMAL HIGH (ref 0–200)
HDL: 33 mg/dL — ABNORMAL LOW (ref >39)
Total CHOL/HDL Ratio: 6.4 RATIO
VLDL: 38 mg/dL (ref 0–40)

## 2011-01-01 LAB — CK TOTAL AND CKMB (NOT AT ARMC): Total CK: 73 U/L (ref 7–232)

## 2011-01-01 LAB — GLUCOSE, CAPILLARY: Glucose-Capillary: 117 mg/dL — ABNORMAL HIGH (ref 70–99)

## 2011-01-01 LAB — TROPONIN I: Troponin I: <0.3 ng/mL (ref <0.30)

## 2011-01-01 LAB — PROTIME-INR: Prothrombin Time: 14.2 seconds (ref 11.6–15.2)

## 2011-01-02 ENCOUNTER — Observation Stay (HOSPITAL_COMMUNITY): Payer: Medicare Other

## 2011-01-02 DIAGNOSIS — I6529 Occlusion and stenosis of unspecified carotid artery: Secondary | ICD-10-CM

## 2011-01-02 DIAGNOSIS — I2510 Atherosclerotic heart disease of native coronary artery without angina pectoris: Secondary | ICD-10-CM

## 2011-01-02 LAB — BASIC METABOLIC PANEL
BUN: 27 mg/dL — ABNORMAL HIGH (ref 6–23)
CO2: 24 mEq/L (ref 19–32)
GFR calc non Af Amer: 47 mL/min — ABNORMAL LOW (ref >60)
Glucose, Bld: 85 mg/dL (ref 70–99)
Potassium: 4.8 mEq/L (ref 3.5–5.1)

## 2011-01-02 LAB — GLUCOSE, CAPILLARY
Glucose-Capillary: 85 mg/dL (ref 70–99)
Glucose-Capillary: 134 mg/dL — ABNORMAL HIGH (ref 70–99)
Glucose-Capillary: 63 mg/dL — ABNORMAL LOW (ref 70–99)
Glucose-Capillary: 95 mg/dL (ref 70–99)

## 2011-01-02 LAB — CBC
HCT: 38.6 % — ABNORMAL LOW (ref 39.0–52.0)
Hemoglobin: 13.2 g/dL (ref 13.0–17.0)
MCH: 30.6 pg (ref 26.0–34.0)
MCHC: 34.2 g/dL (ref 30.0–36.0)
RBC: 4.31 MIL/uL (ref 4.22–5.81)

## 2011-01-02 MED ORDER — TECHNETIUM TC 99M TETROFOSMIN IV KIT
30.0000 | PACK | Freq: Once | INTRAVENOUS | Status: AC | PRN
  Administered 2011-01-02: 30 via INTRAVENOUS

## 2011-01-02 MED ORDER — TECHNETIUM TC 99M TETROFOSMIN IV KIT
10.0000 | PACK | Freq: Once | INTRAVENOUS | Status: AC | PRN
  Administered 2011-01-02: 10 via INTRAVENOUS

## 2011-01-03 LAB — GLUCOSE, CAPILLARY
Glucose-Capillary: 106 mg/dL — ABNORMAL HIGH (ref 70–99)
Glucose-Capillary: 78 mg/dL (ref 70–99)

## 2011-01-03 LAB — CBC
HCT: 39.5 % (ref 39.0–52.0)
Hemoglobin: 13.9 g/dL (ref 13.0–17.0)
MCHC: 35.2 g/dL (ref 30.0–36.0)
MCV: 88.6 fL (ref 78.0–100.0)
RBC: 4.46 MIL/uL (ref 4.22–5.81)
RDW: 12.6 % (ref 11.5–15.5)

## 2011-01-03 NOTE — H&P (Signed)
NAME:  Dean Murphy, Dean Murphy NO.:  [id number]  MEDICAL RECORD NO.:  [id number]  LOCATION:  3705                         FACILITY:  [facility]  PHYSICIAN:  Tarry Kos, MD       DATE OF BIRTH:  Jul 09, 1926  DATE OF ADMISSION:  12/31/2010 DATE OF DISCHARGE:                             HISTORY & PHYSICAL   CHIEF COMPLAINT:  Chest pain.  HISTORY OF PRESENT ILLNESS:  Dean Murphy is a very pleasant 77 year old male who is status post 5-vessel CABG over 20 years ago with also history of hyperlipidemia, non-insulin dependent diabetes, who presents to the emergency department because of chest pain that occurred today when he was resting in his lounge that was substernal in nature that lasted about 5 minutes.  He says it was nonradiating.  He had some diaphoresis with it, but did not have any radiation of the pain elsewhere.  He says it quickly resolved.  He took a couple of aspirin and it went away.  Since his CABG, he has had a stress testing done, but it has been over 5 years ago.  He has not had anything within the last 5 years.  He never experiences any chest pain, anginal symptoms, or shortness of breath.  He is very healthy, very active.  The only other issue he has had is he said a couple of weeks ago he all of a sudden went blind in his right eye.  He went to see an ophthalmologist who is supposed to be doing an MRI and some carotid studies on him next week. That totally resolved quickly.  Other than that, he has been in his normal state of health.  Denies any fevers.  Denies any significant lower extremity edema.  He does not have a history of heart failure in the past.  PAST MEDICAL HISTORY:  CABG over 20 years ago five-vessel, non-insulin- dependent diabetes with GERD, right eye blindness that occurred within the last several weeks that is totally resolved without any focal neurologic deficits at this time.  SOCIAL HISTORY:  He is a nonsmoker.  No alcohol.  No IV drug  abuse.  He does live with his wife.  He is very active.  ALLERGIES:  None.  MEDICATIONS:  He is unsure of his medications.  PHYSICAL EXAMINATION:  VITAL SIGNS:  Temperature is 97.9, blood pressure 164/52, pulse 66, respiration 19, 98% O2 sats on room air. GENERAL:  He is alert and oriented x4, no apparent distress, cooperative, friendly. HEENT:  Extraocular muscles intact.  Pupils equal, reactive to light. Oropharynx clear.  Mucous membranes are moist. NECK:  No JVD.  No carotid bruits. COR:  Regular rate and rhythm without murmurs, rubs, or gallops. CHEST:  Clear to auscultation bilaterally.  No wheezes, rhonchi, or rales. ABDOMEN:  Soft, nontender, nondistended.  Positive bowel sounds.  No hepatosplenomegaly. EXTREMITIES:  No clubbing, cyanosis, or edema. PSYCH:  Normal mood and affect. NEURO:  No focal neurologic deficits. SKIN:  No rashes. NEURO:  Cranial nerves II through XII grossly intact.  No focal neurologic deficits.  CK-MB is slightly elevated at 6.6.  Troponin is negative.  BUN and creatinine are 39  and 2.1.  His last creatinine was Oct 07, 2010, and it was 1.3.  Electrolytes normal.  White count normal.  Hemoglobin normal. Chest x-ray negative for any acute issues.  EKG, sinus rhythm with first- degree AV block with no acute ST- or T-wave changes.  ASSESSMENT:  This is an 77 year old male with atypical chest pain along with what sounds like a possible transient ischemic attack a couple of weeks ago. 1. Atypical chest pain in a high-risk patient.  We will serial his     cardiac enzymes and check a 2-D echocardiogram.  I am empirically     cover him with full dosing Lovenox due to his marginally elevated     CK-MB and also place on baby aspirin. 2. Right eye blindness a couple of weeks ago that was brief and is     totally resolved.  I am going to go ahead and order MRI/MRA of his     head and neck and also check 2-D echocardiogram of his heart.  It     sounds  like this was supposed to be done next week, but since he is     here, we will move forward and work that up.  He certainly at high     risk for extensive vascular disease. 3. Non-insulin-dependent abuse.  We will clarify his home medications     and resume as indicated. 4. The patient is full code.  Further recommendation pending overall     hospital course.          ______________________________ Tarry Kos, MD     RD/MEDQ  D:  12/31/2010  T:  01/01/2011  Job:  [id number]  Electronically Signed by Tarry Kos MD on 01/03/2011 05:48:44 PM

## 2011-01-04 LAB — BASIC METABOLIC PANEL
Calcium: 9.5 mg/dL (ref 8.4–10.5)
GFR calc Af Amer: >60 mL/min (ref >60)
GFR calc non Af Amer: 54 mL/min — ABNORMAL LOW (ref >60)
Sodium: 142 mEq/L (ref 135–145)

## 2011-01-04 LAB — GLUCOSE, CAPILLARY: Glucose-Capillary: 114 mg/dL — ABNORMAL HIGH (ref 70–99)

## 2011-01-05 ENCOUNTER — Encounter: Payer: BC Managed Care – PPO | Admitting: Cardiology

## 2011-01-05 ENCOUNTER — Observation Stay (HOSPITAL_COMMUNITY): Payer: Medicare Other

## 2011-01-05 LAB — BASIC METABOLIC PANEL
CO2: 25 mEq/L (ref 19–32)
Chloride: 109 mEq/L (ref 96–112)
GFR calc non Af Amer: 53 mL/min — ABNORMAL LOW (ref >60)
Glucose, Bld: 105 mg/dL — ABNORMAL HIGH (ref 70–99)
Potassium: 4.4 mEq/L (ref 3.5–5.1)
Sodium: 142 mEq/L (ref 135–145)

## 2011-01-05 LAB — GLUCOSE, CAPILLARY
Glucose-Capillary: 96 mg/dL (ref 70–99)
Glucose-Capillary: 163 mg/dL — ABNORMAL HIGH (ref 70–99)
Glucose-Capillary: 176 mg/dL — ABNORMAL HIGH (ref 70–99)

## 2011-01-05 MED ORDER — IOHEXOL 300 MG/ML  SOLN
150.0000 mL | Freq: Once | INTRAMUSCULAR | Status: AC | PRN
  Administered 2011-01-05: 70 mL via INTRA_ARTERIAL

## 2011-01-05 NOTE — Consult Note (Signed)
NAME:  Dean Murphy, Dean Murphy NO.:  [id number]  MEDICAL RECORD NO.:  [id number]  LOCATION:  3705                         FACILITY:  [facility]  PHYSICIAN:  Verne Carrow, MDDATE OF BIRTH:  1926/07/22  DATE OF CONSULTATION:  01/01/2011 DATE OF DISCHARGE:                                CONSULTATION   PRIMARY CARE PHYSICIAN:  Stacie Glaze, MD  REASON FOR CONSULTATION:  Chest pain.  HISTORY OF PRESENT ILLNESS:  Dean Murphy is a pleasant 77 year old Caucasian male with a history of coronary artery disease status post five-vessel CABG in 1989 per Dr. Andrey Campanile as well as diabetes mellitus, hypertension and hyperlipidemia who was admitted to the hospitalist service after an episode of chest pain.  The patient tells me that he was in his normal state of good health while sitting in a chair yesterday when he had the onset of sudden left-sided chest pain.  Pain lasted for 3-4 minutes.  There was no associated shortness of breath, nausea or dizziness.  He did feel a little sweaty.  The pain resolved after the patient walked for a few minutes and he has had no recurrence of pain.  He was admitted to the hospitalist service overnight and has had negative troponins with mild elevation of CK-MB.  His EKG shows no changes.  There are some T-wave inversions laterally that were on his old EKG in 2010.  The patient currently feels well.  PAST MEDICAL HISTORY: 1. Coronary artery disease. 2. Hypertension. 3. Hyperlipidemia. 4. Diabetes mellitus. 5. Renal insufficiency. 6. GERD. 7. Osteoarthritis. 8. Right eye blindness.  PAST SURGICAL HISTORY: 1. Bilateral knee replacements. 2. Five-vessel CABG in 1989 per Dr. Andrey Campanile. 3. Cholecystectomy. 4. Tonsillectomy.  ALLERGIES:  No known drug allergies.  MEDICATIONS:  Per hospitalized record.  FAMILY HISTORY:  The patient has a family history of coronary artery disease.  SOCIAL HISTORY:  The patient is retired from [employer], he is  on his second marriage and has 3 adult children.  He has not smoked since he was a teenager.  He occasionally has a glass of wine but does not abuse alcohol.  There is no history of illicit drug use.  REVIEW OF SYSTEMS:  As stated in the history present illness is otherwise negative.  PHYSICAL EXAMINATION:  VITAL SIGNS:  Temperature 97.8, pulse is 60 and regular, respirations 18 nonlabored, blood pressure 124/64. GENERAL:  He is a pleasant elderly Caucasian male in no acute distress. He is alert and oriented x3. PSYCHIATRIC:  Mood and affect are appropriate. NEUROLOGIC:  No focal neurological deficits. MUSCULOSKELETAL:  Moves all extremities equally. SKIN:  Warm and dry. NECK:  No JVD.  There is a right carotid bruit. HEENT:  Normal. LUNGS:  Clear to auscultation bilaterally without wheezes, rhonchi or crackles noted. CARDIOVASCULAR:  Regular rate and rhythm without murmurs, gallops or rubs noted. ABDOMEN:  Soft, nontender.  Bowel sounds are present. EXTREMITIES:  No evidence of edema.  All extremities are warm to touch.  DIAGNOSTIC STUDIES: 1. Laboratory values are extensively reviewed and show a creatinine of     2.1.  Hemoglobin is 14.  Platelets are 151.  Troponin is negative  x3.  CK-MB was 6.6 and now down to 4.8. 2. A 12-lead EKG shows normal sinus rhythm with T-wave inversions in     the lateral leads which is unchanged from EKG in 2010.  ASSESSMENT AND PLAN:  This is an 77 year old Caucasian male with history of coronary artery disease status post previous bypass surgery, diabetes mellitus, hypertension, and hyperlipidemia who is admitted after an episode of very brief left-sided chest pain.  The patient has had no assessment of his coronary artery disease in several years.  We will plan on performing a Lexiscan Myoview and an echocardiogram here in the hospital.  Further recommendations will be made following the cardiac testing.  If there were plans for the  patient to go home tomorrow, we are unable to get the stress test here in the hospital then I think it would be appropriate for him to have an outpatient stress Myoview.     Verne Carrow, MD     CM/MEDQ  D:  01/01/2011  T:  01/01/2011  Job:  [id number]  Electronically Signed by Verne Carrow MD on 01/05/2011 10:13:24 AM

## 2011-01-06 DIAGNOSIS — I63239 Cerebral infarction due to unspecified occlusion or stenosis of unspecified carotid arteries: Secondary | ICD-10-CM

## 2011-01-06 LAB — BASIC METABOLIC PANEL
Calcium: 10 mg/dL (ref 8.4–10.5)
Creatinine, Ser: 1.32 mg/dL (ref 0.50–1.35)
GFR calc non Af Amer: 52 mL/min — ABNORMAL LOW (ref >60)
Sodium: 138 mEq/L (ref 135–145)

## 2011-01-06 LAB — CBC
MCHC: 35.4 g/dL (ref 30.0–36.0)
Platelets: 165 10*3/uL (ref 150–400)
RDW: 12.6 % (ref 11.5–15.5)
WBC: 7.2 10*3/uL (ref 4.0–10.5)

## 2011-01-06 LAB — GLUCOSE, CAPILLARY: Glucose-Capillary: 156 mg/dL — ABNORMAL HIGH (ref 70–99)

## 2011-01-06 NOTE — Progress Notes (Signed)
NAME:  Dean Murphy, Dean Murphy NO.:  [id number]  MEDICAL RECORD NO.:  [id number]  LOCATION:  3705                         FACILITY:  [facility]  PHYSICIAN:  Andreas Blower, MD       DATE OF BIRTH:  10/17/1926                                PROGRESS NOTE   PRIMARY CARE PHYSICIAN: Stacie Glaze, MD  PRIMARY CARDIOLOGIST: [facility].  NEUROLOGIST: Pramod P. Pearlean Brownie, MD  VASCULAR SURGEON: Janetta Hora. Fields, MD  BRIEF ADMITTING HISTORY AND PHYSICAL: [name] is an 77 year old gentleman with history of coronary artery disease status post CABG, non-insulin dependent diabetes, and GERD who presented with complaints of chest pain and recent episode of right eye blindness.  RADIOLOGY/IMAGING: 1. The patient had chest x-ray 2-view on December 31, 2010, which shows     no acute cardiopulmonary disease.  Postoperative changes CABG and     cholecystectomy. 2. The patient had MRI of the head and MRA of the head and neck     without contrast, which showed abnormal signal within the right     petrous internal carotid artery suggesting slow or occluded flow.     Remote lacunar infarcts of the left cerebellum and basal ganglia     bilaterally.  Tapering and likely high-grade stenosis with near     occlusion of the petrous right internal carotid artery.  Focal     signal loss and probable high-grade stenosis of proximal     supraclinoid right internal carotid artery with reconstitution.     Segmental irregularity of the right A1 segment.  Signal loss within     the proximal internal and external carotid arteries bilaterally,     suggesting moderate to high-grade stenosis. 3. The patient had a 2D echocardiogram on January 01, 2011, which showed     cavity size was normal, wall thickness was increased in the pattern     of moderate LVH, systolic function was normal, ejection fraction     was 55-60%. 4. The patient had myocardial perfusion scan on January 02, 2011, which     was  essentially negative for ischemia.  LVEF was 75%. 5. The patient had carotid Dopplers on January 01, 2011, which showed     bilaterally ICA greater than 80% stenosis, right greater than left.     ECA is severely stenosed bilaterally.  LABS: CBC shows a white count of 6.4, hemoglobin 13.9, hematocrit 39.5, platelet count 166.  Electrolytes normal with a BUN of 29, creatinine 1.9.  Initial creatinine on presentation was 2.1.  Hemoglobin A1c 6.6. Troponins negative x3.  LDL is 141.  ASSESSMENT AND PLAN: 1. Bilateral internal carotid artery stenoses, right greater than left     with symptomatic right internal carotid artery stenosis with the     right eye blindness/amaurosis fugax of the right eye.  The patient     was evaluated by Neurology.  Dr. Pearlean Brownie from Neurology has been     following the patient.  Given his symptomatic right ICA stenosis,     Vascular Surgery was consulted.  The patient was started on Plavix.     The patient had carotid angiogram  performed on January 05, 2011,     which showed 95% stenosis in the right ICA and 85% stenosis in the     left ICA.  Plan is for carotid endarterectomy on January 07, 2011.     The patient initially was started on aspirin which was discontinued     after the patient was transitioned to Plavix. 2. Chest pain.  The patient was admitted and was ruled out for acute     coronary syndrome.  The patient had a 2D echocardiogram with     results as indicated above.  [facility] evaluated the     patient.  The patient had a stress test done which was negative. 3. Acute renal failure on chronic kidney disease stage II, likely due     to Lasix.  The patient was started on IV hydration.  Lasix was     held.  Renal function improved during the course of the hospital     stay.  The patient is to be adequately hydrated as the patient has     received contrast for carotid angiogram on January 05, 2011. 4. Sinoatrial node dysfunction.  On January 03, 2011,  the patient had     episodes of first-degree arteriovenous block which was thought to     be due to metoprolol.  As a result, metoprolol dose was decreased     from 100 mg to 25 mg. 5. Type 2 diabetes, stable.  The patient was on metformin at home     which has been held.  The patient currently is on glipizide XL.     Given his chronic kidney disease stage II, would consider     discontinuing metformin on his regimen. 6. History of osteoarthritis, stable. 7. Hypertension.  The patient was on benazepril 20 mg daily which was     held due to acute renal failure and chronic kidney disease.  The     patient's amlodipine dose was increased, continued the patient on     metoprolol. 8. Hyperlipidemia.  Continued the patient on statin. 9. Prophylaxis, Lovenox. 10.Disposition.  Pending after carotid endarterectomy which is to be     done on January 07, 2011.     Andreas Blower, MD     SR/MEDQ  D:  01/05/2011  T:  01/05/2011  Job:  [id number]  Electronically Signed by Wardell Heath Deundra Bard  on 01/06/2011 09:55:54 PM

## 2011-01-07 DIAGNOSIS — I6529 Occlusion and stenosis of unspecified carotid artery: Secondary | ICD-10-CM

## 2011-01-07 LAB — CBC
HCT: 40.2 % (ref 39.0–52.0)
Hemoglobin: 14.1 g/dL (ref 13.0–17.0)
MCHC: 35.1 g/dL (ref 30.0–36.0)
MCV: 89.1 fL (ref 78.0–100.0)
RDW: 12.7 % (ref 11.5–15.5)
WBC: 5.9 10*3/uL (ref 4.0–10.5)

## 2011-01-07 LAB — BASIC METABOLIC PANEL
BUN: 29 mg/dL — ABNORMAL HIGH (ref 6–23)
Chloride: 107 mEq/L (ref 96–112)
Creatinine, Ser: 1.53 mg/dL — ABNORMAL HIGH (ref 0.50–1.35)
GFR calc Af Amer: 53 mL/min — ABNORMAL LOW (ref >60)
Glucose, Bld: 108 mg/dL — ABNORMAL HIGH (ref 70–99)
Potassium: 4.1 mEq/L (ref 3.5–5.1)

## 2011-01-07 LAB — GLUCOSE, CAPILLARY
Glucose-Capillary: 125 mg/dL — ABNORMAL HIGH (ref 70–99)
Glucose-Capillary: 111 mg/dL — ABNORMAL HIGH (ref 70–99)
Glucose-Capillary: 95 mg/dL (ref 70–99)

## 2011-01-07 LAB — TYPE AND SCREEN

## 2011-01-07 LAB — SURGICAL PCR SCREEN: Staphylococcus aureus: NEGATIVE

## 2011-01-07 LAB — ABO/RH: ABO/RH(D): AB POS

## 2011-01-08 ENCOUNTER — Other Ambulatory Visit: Payer: Self-pay | Admitting: Vascular Surgery

## 2011-01-08 LAB — BASIC METABOLIC PANEL
BUN: 27 mg/dL — ABNORMAL HIGH (ref 6–23)
Calcium: 8.9 mg/dL (ref 8.4–10.5)
Creatinine, Ser: 1.41 mg/dL — ABNORMAL HIGH (ref 0.50–1.35)
GFR calc Af Amer: 58 mL/min — ABNORMAL LOW (ref >60)
GFR calc non Af Amer: 48 mL/min — ABNORMAL LOW (ref >60)
Glucose, Bld: 157 mg/dL — ABNORMAL HIGH (ref 70–99)

## 2011-01-08 LAB — CBC
HCT: 32.2 % — ABNORMAL LOW (ref 39.0–52.0)
Hemoglobin: 11.3 g/dL — ABNORMAL LOW (ref 13.0–17.0)
MCH: 31.4 pg (ref 26.0–34.0)
MCHC: 35.1 g/dL (ref 30.0–36.0)
MCV: 89.4 fL (ref 78.0–100.0)
RDW: 12.8 % (ref 11.5–15.5)

## 2011-01-08 LAB — GLUCOSE, CAPILLARY

## 2011-01-12 NOTE — Discharge Summary (Addendum)
NAMEMarland Murphy  RUDELL, ORTMAN NO.:  [id number]  MEDICAL RECORD NO.:  [id number]  LOCATION:  3304                         FACILITY:  [facility]  PHYSICIAN:  Janetta Hora. Fields, MD  DATE OF BIRTH:  05-20-1927  DATE OF ADMISSION:  12/31/2010 DATE OF DISCHARGE:  01/08/2011                              DISCHARGE SUMMARY   CHIEF COMPLAINT:  Symptomatic right internal carotid artery stenosis.  HISTORY OF PRESENT ILLNESS:  [name] is an 77 year old gentleman who was admitted on December 31, 2010, to the hospitalist service with a diagnosis of chest pain, who was ruled out for myocardial infarction. He also had related story where he has had right eye temporary blindness which lasted approximately 5 minutes 2 weeks prior to admission.  He had not had any further neurological symptoms.  He denied any prior history of TIA, amaurosis, or stroke.  He had a workup, which showed greater than 80% bilateral carotid stenosis and possible intracranial right internal carotid stenosis, which was ruled out by cerebral angiogram.  PAST MEDICAL HISTORY: 1. Diabetes. 2. Reflux. 3. Hypertension. 4. Coronary artery disease. 5. Hyperlipidemia. 6. Renal insufficiency with creatinine 1.4 to 1.5 range. 7. Osteoarthritis 8. Previous smoker, although the patient has not smoked for 40 years.  HOSPITAL COURSE:  The patient was admitted to the Medical Service and seen by Urology and workup was done including an MRA of the neck which showed a possible intracranial internal carotid artery stenosis as well as greater than 80% bilateral carotid stenosis.  Cerebral angiogram was done on January 01, 2011, showed to be at 85% stenosis bilaterally with no intracranial carotid stenosis.  It was felt on angiogram that the right internal carotid artery stenosis of the bulb was approximately 95%.  It was read as 80-85% on the left side.  Due to the patient's previous symptoms of amaurosis, it was felt that the patient  should be taken to the operating room for a right carotid endarterectomy.  The patient was taken to the operating room on January 07, 2011 for right carotid endarterectomy by Dr. Darrick Penna.  He did well overnight.  He is voiding, ambulating, and taking p.o.  He was alert and oriented x3. Neurologically, he is intact.  He had good and equal strength in bilateral upper and lower extremities.  He had no tongue deviation or facial droop.  His right neck wound was healing well with minimal swelling and no hematoma.  He was swallowing without difficulty and ambulating.  The patient was discharged to home.  DISCHARGE MEDICATIONS: 1. Amlodipine 10 mg daily. 2. Plavix 75 mg daily. 3. Metoprolol was changed from 100 to 25 mg daily. 4. Oxycodone 1-2 tablets as needed for pain every 3 hours. 5. Glipizide 10 mg daily. 6. Metformin 500 mg daily. 7. Nabumetone 750 mg twice daily.  FINAL DIAGNOSES: 1. Critical bilateral right carotid stenosis with amaurosis fugax on     the right status post right carotid endarterectomy. 2. Chest pain in this patient with heart disease and     hypercholesterolemia.  He was ruled out for myocardial infarction     preop.  All of his other medical issues were  stable while in-house.  His Lasix was discontinued.  His metoprolol was decreased from 100 mg to 25 mg and his benazepril was stopped and his amlodipine was increased from 5 mg to 10 mg.  DISPOSITION:  The patient was discharged to home.  He will follow up with Dr. Darrick Penna in 2 weeks with duplex scan.  He will also be seen by Dr. Pearlean Brownie in 2 months.     Della Goo, PA-C   ______________________________ Janetta Hora Fields, MD    RR/MEDQ  D:  01/08/2011  T:  01/08/2011  Job:  [id number]  Electronically Signed by Della Goo PA on 01/12/2011 04:02:57 PM Electronically Signed by Fabienne Bruns MD on 01/22/2011 06:28:10 PM

## 2011-01-20 ENCOUNTER — Encounter: Payer: Self-pay | Admitting: Vascular Surgery

## 2011-01-21 ENCOUNTER — Encounter: Payer: Self-pay | Admitting: Vascular Surgery

## 2011-01-22 ENCOUNTER — Ambulatory Visit (INDEPENDENT_AMBULATORY_CARE_PROVIDER_SITE_OTHER): Payer: Medicare Other

## 2011-01-22 ENCOUNTER — Encounter: Payer: Self-pay | Admitting: Vascular Surgery

## 2011-01-22 ENCOUNTER — Encounter: Payer: Self-pay | Admitting: *Deleted

## 2011-01-22 ENCOUNTER — Ambulatory Visit (INDEPENDENT_AMBULATORY_CARE_PROVIDER_SITE_OTHER): Payer: Medicare Other | Admitting: Vascular Surgery

## 2011-01-22 VITALS — BP 187/85 | HR 74 | Ht 71.0 in | Wt 200.0 lb

## 2011-01-22 DIAGNOSIS — I6529 Occlusion and stenosis of unspecified carotid artery: Secondary | ICD-10-CM

## 2011-01-22 DIAGNOSIS — Z48812 Encounter for surgical aftercare following surgery on the circulatory system: Secondary | ICD-10-CM

## 2011-01-22 NOTE — Op Note (Signed)
NAMEMarland Kitchen  Dean Murphy, Dean Murphy NO.:  [id number]  MEDICAL RECORD NO.:  [id number]  LOCATION:  3304                         FACILITY:  [facility]  PHYSICIAN:  Janetta Hora. Fields, MD  DATE OF BIRTH:  08-17-1926  DATE OF PROCEDURE:  12/31/2010 DATE OF DISCHARGE:                              OPERATIVE REPORT   PROCEDURE:  Right carotid endarterectomy.  PREOPERATIVE DIAGNOSIS:  Symptomatic right internal carotid artery stenosis.  POSTOPERATIVE DIAGNOSIS:  Symptomatic right internal carotid artery stenosis.  ANESTHESIA:  General.  ASSISTANT:  Newton Pigg, PA-C.  OPERATIVE FINDINGS: 1. Greater than 90% right internal carotid artery stenosis with     calcified plaque. 2. 10-French shunt. 3. Dacron patch.  OPERATIVE FIELDS:  After obtaining informed consent, the patient was taken to the operating room.  The patient was placed in the supine position on the operating table.  After induction of general anesthesia and endotracheal intubation, the patient's entire right neck and chest were prepped and draped in the usual sterile fashion.  An oblique incision was made on the right side of the neck, carried down through subcutaneous tissues and through the platysma.  Sternocleidomastoid muscle was identified and retracted laterally.  The common facial vein was identified and ligated and divided between silk ties.  Jugular vein was then retracted laterally.  The omohyoid muscle was divided with cautery.  The common carotid artery was dissected free circumferentially and an umbilical tape was placed around this.  Vagus nerve and ansa cervicalis were identified and protected from harm's way.  Dissection was carried up to the level of the carotid bifurcation.  The external carotid and superior thyroid arteries were dissected free circumferentially and vessel loops were placed around these.  The internal carotid artery was dissected free circumferentially at the level that was  palpable and free of plaque.  This was below the level of the hypoglossal nerve and this was just out of the operative field.  Next, the patient was given 10,000 units of intravenous heparin.  The patient was given an additional 5000 units of heparin during the course of the case.  The distal internal carotid artery was controlled with fine bulldog clamp.  The external carotid and superior thyroid arteries were controlled with vessel loops.  The common carotid was controlled with peripheral DeBakey clamp.  Longitudinal arteriotomy was made in the common carotid artery just below the bifurcation.  This was extended with Potts scissors.  There was a large calcified plaque at the carotid bifurcation obscuring greater than 90% of the lumen.  This was extended several centimeters into the internal carotid artery.  The arteriotomy was extended past the level of stenosis.  A 10-French shunt brought on the operative field and threaded in the distal internal carotid artery and allowed to backbleed thoroughly.  There was reasonable backbleedingfrom this.  The patient's mean arterial pressure was kept 95 mmHg during placed of the shunt to increase cerebral perfusion pressure.  The proximal end of the shunt was placed down the common carotid artery and secured with a Rumel tourniquet.  Shunt was inspected, found to be free of air and flow was restored to the brain after approximately  4 minutes of the ischemia time.  Next, endarterectomy was begun in a suitable plane near the carotid bifurcation.  A nice feathered proximal endpoint was obtained.  A feathered distal endpoint was also obtained.  There was some calcification of the distal internal carotid artery, but there was a good lumen distally and I was satisfied with the endpoint.  All loose debris was removed from the carotid bed.  The external carotid artery was endarterectomized by eversion technique.  Dacron patch was then brought up in the  operative field and sewn as patch angioplasty using running 6-0 Prolene suture.  Just prior to completion anastomosis, the shunt was re-occluded and brought down the distal internal carotid artery and was allowed to backbleed thoroughly and then re-occluded. Shunt was then removed from the common carotid artery and this was thoroughly forebled and then re-occluded with peripheral DeBakey clamp. The external carotid artery was thoroughly backbled.  Everything was thoroughly irrigated with heparinized saline.  Remainder of patch was completed.  Flow was then first restored retrograde from the external carotid artery and  antegrade from common carotid and external carotid artery and then finally to the internal carotid artery after approximately 5 cardiac cycles.  There was one smaller bleeding from the very distal end of the patch and this was repaired with a single 7-0 Prolene suture.  The patient was also given 150 mg of protamine for reversal of heparin.  Thrombin and Gelfoam was applied.  After hemostasis was obtained, the Doppler was used to evaluate the internal and external carotid arteries and these all had good Doppler flow.Platysma muscle was reapproximated using running 3-0 Vicryl suture.  The skin was closed with 4-0 Vicryl subcuticular stitch.  The patient tolerated the procedure well and there were no complications. Instrument, sponge, and needle counts were correct at the end of the case.  The patient was awakened in the operating room and extubated and was moving upper extremities and lower extremities symmetrically and following commands.  The patient was taken to the recovery room in stable condition.     Janetta Hora. Fields, MD     CEF/MEDQ  D:  01/07/2011  T:  01/08/2011  Job:  [id number]  Electronically Signed by Fabienne Bruns MD on 01/22/2011 91:47:82 PM

## 2011-01-22 NOTE — Consult Note (Signed)
NAMEMarland Murphy  Dean Murphy NO.:  [id number]  MEDICAL RECORD NO.:  [id number]  LOCATION:  3705                         FACILITY:  [facility]  PHYSICIAN:  Janetta Hora. Fields, MD  DATE OF BIRTH:  02/12/1927  DATE OF CONSULTATION:  01/02/2011 DATE OF DISCHARGE:                                CONSULTATION   REQUESTING PHYSICIAN:  [facility].  REASON FOR CONSULTATION:  Symptomatic right internal carotid artery stenosis.  HISTORY OF PRESENT ILLNESS:  The patient is an 77 year old male who was admitted on December 31, 2010.  He was admitted with a diagnosis of chest pain and ruled out for myocardial infarction.  He also relayed a story where he had right eye temporary blindness which lasted approximately 5 minutes.  This occurred apparently a couple of weeks ago.  He has not had any further neurologic episodes.  He denies any prior history of TIA, amaurosis or stroke.  Atherosclerotic risk factors include coronary artery disease, hyperlipidemia, hypertension, and a remote history of smoking.  He has not smoked for approximately 40 years.  CHRONIC MEDICAL PROBLEMS:  Diabetes, reflux, hypertension, coronary artery disease, hyperlipidemia, renal insufficiency, osteoarthritis, these were all currently stable and well controlled.  PAST SURGICAL HISTORY:  Bilateral knee replacements coronary artery bypass grafting 1989, cholecystectomy, tonsillectomy.  CURRENT MEDICATIONS: 1. Amlodipine 5 mg once a day. 2. Aspirin 325 mg once a day. 3. Benazepril 20 mg once a day. 4. Plavix 75 mg once a day starting today. 5. Lovenox 40 mg subcu once daily. 6. Glipizide 10 mg once a day. 7. Metoprolol 100 mg once a day. 8. Simvastatin 20 mg at bedtime.  ALLERGIES:  MORPHINE made him feel funny.  REVIEW OF SYSTEMS:  GENERAL:  Denies recent weight loss or gain.  Denies fever or chills.  HEENT:  Other than the above-mentioned eye symptoms, he has no symptoms of eye  problems or hearing problems.  PULMONARY:  He denies history of asthma or wheezing.  No recent productive cough. CARDIAC:  He did have an episode of chest pain during this hospital admission but was ruled out for myocardial infarction and had a normal nuclear stress test with an ejection fraction of 70%, no reversible defects.  GI:  Denies recent history of GI bleeding.  NEURO:  As per HPI.  MUSCULOSKELETAL:  No significant joint deformities.  SKIN:  No ulcers or rashes.  All other organ systems are negative as well.  SOCIAL HISTORY:  Smoking as listed above, married.  FAMILY HISTORY:  No significant family history of early vascular disease at young age.  PHYSICAL EXAMINATION:  VITAL SIGNS:  Temperature 97.5, heart rate 60 and regular, blood pressure 104/55, respirations 18, oxygen saturation 97% on room air. HEENT:  Unremarkable. NECK:  2+ carotid pulses. CHEST:  Clear to auscultation. CARDIAC:  Regular rate and rhythm without murmur. ABDOMEN:  Soft, nontender, nondistended.  No mass. EXTREMITIES:  2+ femoral pulses bilaterally, absent popliteal and pedal pulses bilaterally.  No significant edema. NEURO:  Symmetric upper extremity and lower extremity motor strength 5/5. SKIN:  No open ulcers or rashes.  LABORATORY:  Creatinine 1.4, this is down from 2.1 on admission.  White blood cells 6.2, hemoglobin 13.2, platelets 132.  Troponin less than 0.3.  Lexiscan Myoview EF 75%.  No reversible defects.  MRA of the neck images are reviewed which shows possible high-grade bilateral internal carotid artery stenosis with possible intracranial involvement and stenting of the petrous portion of the carotid on the right side.  ASSESSMENT:  Symptomatic right internal carotid artery stenosis.  PLAN:  The patient needs a carotid angiogram to further evaluate whether he has intracranial occlusive disease and whether or not there is a distal end point for planning purposes for possible  carotid endarterectomy.  If the patient does not have significant intracranial disease with extension of plaque into the skull base, he would be a reasonable candidate for a right carotid endarterectomy for symptomatic stenosis.  He has low cardiac risk.  He is greater than age 85 which would raise his risk of stroke with carotid stenting.  However, if the lesion does extend into the skull base, I think the best option would be carotid stenting as this would not be approachable from a cervical carotid endarterectomy approach.  If he also has high-grade stenosis on the left side, we could consider staged carotid endarterectomy on the left side but again we will further evaluate after carotid angiogram.  Potentially, we will schedule a carotid angiogram with Dr. Corliss Skains, Neuroradiology on Monday.  I have the patient today but had not received a response back, we will try to schedule this within the next few days.  Risks, benefits, possible complications of procedure details of carotid endarterectomy, carotid angiogram, carotid angioplasty and stenting were all discussed with the patient and several family members.  I agree with the Neurology Service that we should go ahead and place him on Plavix and aspirin at this time in case he needs to proceed forward with carotid stenting.     Janetta Hora. Fields, MD    CEF/MEDQ  D:  01/02/2011  T:  01/03/2011  Job:  [id number]  Electronically Signed by Fabienne Bruns MD on 01/22/2011 06:28:16 PM

## 2011-01-22 NOTE — Progress Notes (Signed)
Patient is an 77 year old male who underwent right carotid endarterectomy on 01/08/2011 he returns today for further followup. He has a known high-grade left internal carotid artery stenosis and planning is underway for a left carotid endarterectomy. He continues to take Plavix 75 mg once a day. He denies any symptoms of TIA amaurosis or stroke. He has return to normal activities. He has no difficulty swallowing. He has no hoarseness.  Past Medical History  Diagnosis Date  . Diabetes mellitus   . GERD (gastroesophageal reflux disease)   . Hyperlipidemia   . Hypertension   . Arthritis   . Diverticulosis   . Hemorrhoids   . Colon polyps   . CAD (coronary artery disease)   . Chronic kidney disease     History   Social History  . Marital Status: Married    Spouse Name: N/A    Number of Children: N/A  . Years of Education: N/A   Occupational History  . retired    Social History Main Topics  . Smoking status: Former Smoker    Quit date: 01/22/1971  . Smokeless tobacco: Not on file   Comment: smoked in high school  . Alcohol Use: No  . Drug Use: No  . Sexually Active: Not on file   Other Topics Concern  . Not on file   Social History Narrative  . No narrative on file    Current Outpatient Prescriptions on File Prior to Visit  Medication Sig Dispense Refill  . amLODipine (NORVASC) 10 MG tablet Take 10 mg by mouth daily.        . clopidogrel (PLAVIX) 75 MG tablet Take 75 mg by mouth daily.        [name] glipiZIDE (GLUCOTROL) 10 MG tablet Take 1 tablet (10 mg total) by mouth daily.  90 tablet  3  . glucose blood (ONE TOUCH TEST STRIPS) test strip 1 each by Other route 2 (two) times daily as needed. Use as instructed       . lubiprostone (AMITIZA) 8 MCG capsule Take 1 capsule (8 mcg total) by mouth daily with breakfast.  90 capsule  3  . nabumetone (RELAFEN) 750 MG tablet Take 750 mg by mouth 2 (two) times daily.        . ONE TOUCH LANCETS MISC by Does not apply route as directed.         . Saxagliptin-Metformin (KOMBIGLYZE XR) 2.09-998 MG TB24 Take 1 tablet by mouth daily.  90 tablet  3  . aspirin 81 MG tablet Take 81 mg by mouth daily.        . benazepril (LOTENSIN) 40 MG tablet Take 1 tablet (40 mg total) by mouth daily.  90 tablet  3  . furosemide (LASIX) 20 MG tablet Take 1 tablet (20 mg total) by mouth 2 (two) times daily.  180 tablet  3  . metoprolol (LOPRESSOR) 100 MG tablet Take 1 tablet (100 mg total) by mouth daily.  90 tablet  3  . OXYCODONE HCL PO Take 1-2 tablets by mouth every 3 (three) hours as needed.          No Known Allergies  Physical Exam: BP 187/85  Pulse 74  Ht 5\' 11"  (1.803 m)  Wt 200 lb (90.719 kg)  BMI 27.89 kg/m2  HEENT: Negative  Neck: Right neck incision well-healed with no carotid bruit, left carotid bruit present, 2+ left and right carotid pulse  Chest: Clear to auscultation bilaterally  Cardiac: Regular rate and rhythm without murmur  Neuro: Cranial nerves II through XII intact, upper extremity and lower extremity motor strength 5 over 5  Assessmen/plant: Patient has recovered from right carotid endarterectomy. He needs a left carotid endarterectomy for asymptomatic high-grade stenosis. He will continue his Plavix. We will schedule his left carotid endarterectomy for 01/09/2011.

## 2011-01-23 ENCOUNTER — Ambulatory Visit (INDEPENDENT_AMBULATORY_CARE_PROVIDER_SITE_OTHER): Payer: Medicare Other | Admitting: Internal Medicine

## 2011-01-23 ENCOUNTER — Encounter: Payer: Self-pay | Admitting: Internal Medicine

## 2011-01-23 VITALS — BP 140/70 | HR 80 | Temp 98.2°F | Resp 16 | Ht 70.0 in | Wt 196.0 lb

## 2011-01-23 DIAGNOSIS — I779 Disorder of arteries and arterioles, unspecified: Secondary | ICD-10-CM

## 2011-01-23 DIAGNOSIS — E785 Hyperlipidemia, unspecified: Secondary | ICD-10-CM

## 2011-01-23 DIAGNOSIS — I10 Essential (primary) hypertension: Secondary | ICD-10-CM

## 2011-01-23 DIAGNOSIS — N289 Disorder of kidney and ureter, unspecified: Secondary | ICD-10-CM

## 2011-01-23 DIAGNOSIS — E1129 Type 2 diabetes mellitus with other diabetic kidney complication: Secondary | ICD-10-CM

## 2011-01-23 DIAGNOSIS — T887XXA Unspecified adverse effect of drug or medicament, initial encounter: Secondary | ICD-10-CM

## 2011-01-23 DIAGNOSIS — K219 Gastro-esophageal reflux disease without esophagitis: Secondary | ICD-10-CM

## 2011-01-23 LAB — CBC WITH DIFFERENTIAL/PLATELET
Basophils Absolute: 0.1 10*3/uL (ref 0.0–0.1)
Basophils Relative: 0.8 % (ref 0.0–3.0)
Eosinophils Absolute: 0.1 10*3/uL (ref 0.0–0.7)
Lymphocytes Relative: 24.6 % (ref 12.0–46.0)
MCHC: 33.6 g/dL (ref 30.0–36.0)
Neutrophils Relative %: 65.3 % (ref 43.0–77.0)
RBC: 4.55 Mil/uL (ref 4.22–5.81)
RDW: 13.6 % (ref 11.5–14.6)

## 2011-01-23 LAB — BASIC METABOLIC PANEL
CO2: 30 mEq/L (ref 19–32)
Calcium: 9.7 mg/dL (ref 8.4–10.5)
Creatinine, Ser: 1.7 mg/dL — ABNORMAL HIGH (ref 0.4–1.5)
GFR: 42.68 mL/min — ABNORMAL LOW (ref >60.00)

## 2011-01-23 MED ORDER — FUROSEMIDE 20 MG PO TABS: 20.0000 mg | ORAL_TABLET | Freq: Every day | ORAL | Status: DC

## 2011-01-23 MED ORDER — CLOPIDOGREL BISULFATE 75 MG PO TABS: 75.0000 mg | ORAL_TABLET | Freq: Every day | ORAL | Status: DC

## 2011-01-23 MED ORDER — ASPIRIN EC 81 MG PO TBEC: 81.0000 mg | DELAYED_RELEASE_TABLET | Freq: Every day | ORAL | Status: DC

## 2011-01-23 NOTE — Progress Notes (Signed)
Subjective:    Patient ID: Dean Murphy, male    DOB: [birth year], 77 y.o.   MRN: [id number]  HPI Patient is a 77-year-old male who has recently undergone a carotid endarterectomy on the right and is scheduled to undergo a carotid endarterectomy on left His known coronary artery disease with bypass surgery and the development of progressive symptomatic carotid disease.  He is undergone one of the 2 procedures and is doing well his blood pressure is moderately elevated at home he was gone on amlodipine and continued on his beta blocker due to his elevations of creatinine postoperatively.  Today we will monitor her basic metabolic panel and we'll resume Lasix 20 mg by mouth daily both to better control his blood pressure and for the lower extremity edema that is present with the use of EMLA to pain.    Review of Systems  Constitutional: Negative for fever and fatigue.  HENT: Negative for hearing loss, congestion, neck pain and postnasal drip.   Eyes: Negative for discharge, redness and visual disturbance.  Respiratory: Negative for cough, shortness of breath and wheezing.   Cardiovascular: Negative for leg swelling.  Gastrointestinal: Negative for abdominal pain, constipation and abdominal distention.  Genitourinary: Negative for urgency and frequency.  Musculoskeletal: Negative for joint swelling and arthralgias.  Skin: Negative for color change and rash.  Neurological: Negative for weakness and light-headedness.  Hematological: Negative for adenopathy.  Psychiatric/Behavioral: Negative for behavioral problems.   Past Medical History  Diagnosis Date  . Diabetes mellitus   . GERD (gastroesophageal reflux disease)   . Hyperlipidemia   . Hypertension   . Arthritis   . Diverticulosis   . Hemorrhoids   . Colon polyps   . CAD (coronary artery disease)   . Chronic kidney disease    Past Surgical History  Procedure Date  . Coronary artery bypass graft   . Cholecystectomy   . Total  knee arthroplasty     bilateral  . Tonsillectomy   . Carotid endarterectomy 12/31/10    Right    reports that he quit smoking about 40 years ago. He does not have any smokeless tobacco history on file. He reports that he does not drink alcohol or use illicit drugs. family history includes Cancer in an unspecified family member; Diabetes in an unspecified family member; and Heart disease in an unspecified family member. No Known Allergies     Objective:   Physical Exam  Nursing note and vitals reviewed. Constitutional: He appears well-developed and well-nourished.  HENT:  Head: Normocephalic and atraumatic.  Eyes: Conjunctivae are normal. Pupils are equal, round, and reactive to light.  Neck: Normal range of motion. Neck supple.  Cardiovascular: Normal rate and regular rhythm.   Pulmonary/Chest: Effort normal and breath sounds normal.  Abdominal: Soft. Bowel sounds are normal.          Assessment & Plan:  Known peripheral vascular disease status post 1 of 2 carotid endarterectomies doing well post his procedure.  In the hospital he was seen to have worsening creatinine BUN and elevated potassium therefore the ACE inhibitor was discontinued.  His blood glucoses also were elevated during the hospital so an A1c at this time would be counter productive since it would reflect the stress of his surgery.  The measure of basic metabolic panel and CBC today to see what his blood glucoses look like his renal insufficiency off the ACE inhibitor as well to see what his potassium has been doing  We will resume the Lasix  due to some lower extremity edema associated with the use of the amlodipine and also to better cnotrol his blood pressure ,we'll resume an aspirin a day along with Plavix he will continue the Plavix until after his  second surgery

## 2011-01-23 NOTE — Patient Instructions (Signed)
For your blood pressure you need to resume the Lasix or furosemide 20 mg one by mouth daily You need to resume the baby aspirin or 81 mg aspirin daily I agree with staying on the Plavix Our monitor your kidney function and your blood count today We will see each other in 2 months

## 2011-01-30 ENCOUNTER — Other Ambulatory Visit (INDEPENDENT_AMBULATORY_CARE_PROVIDER_SITE_OTHER): Payer: Medicare Other

## 2011-01-30 DIAGNOSIS — I10 Essential (primary) hypertension: Secondary | ICD-10-CM

## 2011-01-30 DIAGNOSIS — R300 Dysuria: Secondary | ICD-10-CM

## 2011-01-30 LAB — POCT URINALYSIS DIPSTICK
Bilirubin, UA: NEGATIVE
Ketones, UA: NEGATIVE
Leukocytes, UA: NEGATIVE
Protein, UA: NEGATIVE
Spec Grav, UA: 1.015

## 2011-01-30 LAB — BASIC METABOLIC PANEL
Chloride: 103 mEq/L (ref 96–112)
Potassium: 5.1 mEq/L (ref 3.5–5.1)

## 2011-02-04 ENCOUNTER — Other Ambulatory Visit: Payer: Self-pay | Admitting: *Deleted

## 2011-02-04 MED ORDER — METOPROLOL SUCCINATE ER 25 MG PO TB24: 25.0000 mg | ORAL_TABLET | Freq: Every day | ORAL | Status: DC

## 2011-02-05 ENCOUNTER — Encounter (HOSPITAL_COMMUNITY)
Admission: RE | Admit: 2011-02-05 | Discharge: 2011-02-05 | Disposition: A | Discharge status: Home / Self Care | Payer: Medicare Other | Source: Ambulatory Visit | Attending: Vascular Surgery | Admitting: Vascular Surgery

## 2011-02-05 ENCOUNTER — Ambulatory Visit (HOSPITAL_COMMUNITY)
Admission: RE | Admit: 2011-02-05 | Discharge: 2011-02-05 | Disposition: A | Discharge status: Home / Self Care | Payer: Medicare Other | Source: Ambulatory Visit | Attending: Vascular Surgery | Admitting: Vascular Surgery

## 2011-02-05 ENCOUNTER — Other Ambulatory Visit: Payer: Self-pay | Admitting: Vascular Surgery

## 2011-02-05 DIAGNOSIS — I6529 Occlusion and stenosis of unspecified carotid artery: Secondary | ICD-10-CM

## 2011-02-05 DIAGNOSIS — I10 Essential (primary) hypertension: Secondary | ICD-10-CM | POA: Insufficient documentation

## 2011-02-05 DIAGNOSIS — Z01810 Encounter for preprocedural cardiovascular examination: Secondary | ICD-10-CM | POA: Insufficient documentation

## 2011-02-05 DIAGNOSIS — Z01812 Encounter for preprocedural laboratory examination: Secondary | ICD-10-CM | POA: Insufficient documentation

## 2011-02-05 DIAGNOSIS — Z01818 Encounter for other preprocedural examination: Secondary | ICD-10-CM | POA: Insufficient documentation

## 2011-02-05 LAB — CBC
HCT: 42.6 % (ref 39.0–52.0)
Hemoglobin: 15.4 g/dL (ref 13.0–17.0)
MCV: 88.2 fL (ref 78.0–100.0)
RBC: 4.83 MIL/uL (ref 4.22–5.81)
WBC: 7.1 10*3/uL (ref 4.0–10.5)

## 2011-02-05 LAB — COMPREHENSIVE METABOLIC PANEL
ALT: 21 U/L (ref 0–53)
Alkaline Phosphatase: 80 U/L (ref 39–117)
BUN: 19 mg/dL (ref 6–23)
CO2: 30 mEq/L (ref 19–32)
Chloride: 105 mEq/L (ref 96–112)
GFR calc Af Amer: >60 mL/min (ref >60)
GFR calc non Af Amer: 52 mL/min — ABNORMAL LOW (ref >60)
Glucose, Bld: 122 mg/dL — ABNORMAL HIGH (ref 70–99)
Potassium: 3.9 mEq/L (ref 3.5–5.1)
Sodium: 142 mEq/L (ref 135–145)
Total Bilirubin: 0.7 mg/dL (ref 0.3–1.2)

## 2011-02-05 LAB — TYPE AND SCREEN
ABO/RH(D): AB POS
Antibody Screen: NEGATIVE

## 2011-02-05 LAB — URINALYSIS, ROUTINE W REFLEX MICROSCOPIC
Bilirubin Urine: NEGATIVE
Glucose, UA: 500 mg/dL — AB
Hgb urine dipstick: NEGATIVE
Specific Gravity, Urine: 1.022 (ref 1.005–1.030)
pH: 7 (ref 5.0–8.0)

## 2011-02-09 ENCOUNTER — Other Ambulatory Visit: Payer: Self-pay | Admitting: Vascular Surgery

## 2011-02-09 ENCOUNTER — Inpatient Hospital Stay (HOSPITAL_COMMUNITY)
Admission: RE | Admit: 2011-02-09 | Discharge: 2011-02-10 | DRG: 039 | Disposition: A | Discharge status: Home / Self Care | Payer: Medicare Other | Source: Ambulatory Visit | Attending: Vascular Surgery | Admitting: Vascular Surgery

## 2011-02-09 DIAGNOSIS — E119 Type 2 diabetes mellitus without complications: Secondary | ICD-10-CM | POA: Diagnosis present

## 2011-02-09 DIAGNOSIS — Z87891 Personal history of nicotine dependence: Secondary | ICD-10-CM

## 2011-02-09 DIAGNOSIS — Z01810 Encounter for preprocedural cardiovascular examination: Secondary | ICD-10-CM

## 2011-02-09 DIAGNOSIS — Z96659 Presence of unspecified artificial knee joint: Secondary | ICD-10-CM

## 2011-02-09 DIAGNOSIS — I129 Hypertensive chronic kidney disease with stage 1 through stage 4 chronic kidney disease, or unspecified chronic kidney disease: Secondary | ICD-10-CM | POA: Diagnosis present

## 2011-02-09 DIAGNOSIS — Z01812 Encounter for preprocedural laboratory examination: Secondary | ICD-10-CM

## 2011-02-09 DIAGNOSIS — Z7902 Long term (current) use of antithrombotics/antiplatelets: Secondary | ICD-10-CM

## 2011-02-09 DIAGNOSIS — E785 Hyperlipidemia, unspecified: Secondary | ICD-10-CM | POA: Diagnosis present

## 2011-02-09 DIAGNOSIS — I2510 Atherosclerotic heart disease of native coronary artery without angina pectoris: Secondary | ICD-10-CM | POA: Diagnosis present

## 2011-02-09 DIAGNOSIS — Z951 Presence of aortocoronary bypass graft: Secondary | ICD-10-CM

## 2011-02-09 DIAGNOSIS — N189 Chronic kidney disease, unspecified: Secondary | ICD-10-CM | POA: Diagnosis present

## 2011-02-09 DIAGNOSIS — I6529 Occlusion and stenosis of unspecified carotid artery: Secondary | ICD-10-CM

## 2011-02-09 DIAGNOSIS — Z7982 Long term (current) use of aspirin: Secondary | ICD-10-CM

## 2011-02-09 DIAGNOSIS — K219 Gastro-esophageal reflux disease without esophagitis: Secondary | ICD-10-CM | POA: Diagnosis present

## 2011-02-09 DIAGNOSIS — Z79899 Other long term (current) drug therapy: Secondary | ICD-10-CM

## 2011-02-09 HISTORY — PX: CAROTID ENDARTERECTOMY: SUR193

## 2011-02-09 LAB — GLUCOSE, CAPILLARY
Glucose-Capillary: 160 mg/dL — ABNORMAL HIGH (ref 70–99)
Glucose-Capillary: 212 mg/dL — ABNORMAL HIGH (ref 70–99)

## 2011-02-10 LAB — BASIC METABOLIC PANEL
CO2: 27 mEq/L (ref 19–32)
Calcium: 8.6 mg/dL (ref 8.4–10.5)
Potassium: 4 mEq/L (ref 3.5–5.1)
Sodium: 141 mEq/L (ref 135–145)

## 2011-02-10 LAB — CBC
MCH: 30.9 pg (ref 26.0–34.0)
Platelets: 143 10*3/uL — ABNORMAL LOW (ref 150–400)
RBC: 3.92 MIL/uL — ABNORMAL LOW (ref 4.22–5.81)
WBC: 9.7 10*3/uL (ref 4.0–10.5)

## 2011-02-10 LAB — GLUCOSE, CAPILLARY: Glucose-Capillary: 149 mg/dL — ABNORMAL HIGH (ref 70–99)

## 2011-02-11 NOTE — Consult Note (Signed)
NAME:  Dean, Murphy NO.:  [id number]  MEDICAL RECORD NO.:  [id number]  LOCATION:  3705                         FACILITY:  [facility]  PHYSICIAN:  Melvyn Novas, M.D.  DATE OF BIRTH:  13-Nov-1926  DATE OF CONSULTATION:  01/01/2011 DATE OF DISCHARGE:                                CONSULTATION   An 77 year old Caucasian right-handed male.  [name] is an 77 year old Caucasian married gentleman who presented on December 31, 2010, to the hospital for evaluation following a sudden loss of right-sided vision.  The patient stated that the vision loss happened on Saturday and was complete for the right eye alone-lasting about 12 minutes and slowly resolving over another 20 minutes or so. Since the onset was on the Saturday and the spell resolved, he was not too concerned and saw his optometrist or ophthalmologist the following Monday.  His eye doctor could not find any intraocular abnormality but advised him to have a vascular evaluation and had explained to him that he suspected a "TIA of the eye."  The patient had even been told that he most likely has amaurosis fugax by this term but could not remember the word.  No symptoms at present.  His review of systems was actually negative on December 31, 2010, when he arrived here for admission in all categories except for the sudden vision loss.  Then today, Dr. Clifton Onyx saw him for chest pain.  This lasted about 3 minutes and resolved completely.  Now, again he states he is without any symptoms and he appears in good spirits.  The patient has an extensive past medical history.  He has diabetes, GERD, cholecystectomy, tonsillectomy, knee replacements bilaterally, hyperlipidemia, hypertension, renal insufficiency.  In 1989, he underwent a five-vessel bypass surgery by Dr. Kennon Portela.  He also has osteoarthritis.  The family history is positive for hypertension.  SOCIAL HISTORY:  The patient is a nonsmoker, rarely  drinks.  He is married, lives with his wife in a private home independently, and he is driving.  He is retired.  CURRENT MEDICATIONS:  Norvasc, aspirin, Lotensin, subcutaneous heparin, Glucotrol, Lopressor, Zocor, and 0.9% natrium chloride.  The patient has an allergy to MORPHINE.  He actually states that he feels bad when taking morphine.  He had no anaphylactic or other reaction to it.  CURRENT LABORATORY TEST:  Sodium level of 142, potassium of 5, glucose 95, BUN 32, creatinine 1.55.  Glomerular filtration rate is estimated to be 43, so significantly reduced.  The patient has a hemoglobin A1c of 6.6 and a mean plasma glucose over the day of 143.  CK and MB were 73 and 4.8.  Lipid profile was 212 for the triglyceride, 188 for cholesterol, HDL 33, and for LDL cholesterol 141, troponin less than 30. PTT was 34, PT was 13.2, INR was 1.8.  Repeat troponins were also negative, but he had mild elevation of CK-MB throughout the day on December 31, 2010.  The patient had undergone an MRI/MRA which showed no acute strokes, but remote lacunar infarcts in the left cerebellum and basal ganglia bilaterally.  He had atrophy and scattered white matter disease, likely reflecting the sequelae of  microvascular ischemia in hypertension and diabetes.  There was an abnormal signal within the right petrous internal carotid artery suggesting low flow.  Doppler study confirmed bilateral over 80% bilateral stenosis, left more than right.  PHYSICAL EXAMINATION:  VITAL SIGNS:  98.4 degrees Fahrenheit, blood pressure 135/65 mmHg, respiratory rate 18 per minute, pulse rate 65 beats per minute. CHEST:  The patient's lungs are clear to auscultation. CARDIAC:  He has a regular rate and rhythm of the heart.  No peripheral edema.  There was a carotid bruit on the left side while I did not auscultate one on the right in spite of the bilateral fairly similar stenosis by Doppler study. NEUROLOGIC:  Mental Status:   The patient is alert and oriented, has a fluent speech.  Cranial nerves show pupils reactive and equal to light. No ptosis.  Full extraocular movements.  No diplopia.  No facial plegia. Neck is supple. Interesting is that there is no retinal pallor.  His tongue and uvula are midline.  Shoulder shrug is equal.  The deep tendon reflexes are equal except for the patellar reflexes which are attenuated.  He has intact grip strengths bilaterally.  Coordination test, tested by finger- nose-finger test was intact.  Blind finger-nose test shows no dysmetria, ataxia, or tremor.  The patient had a normal gait unassisted, has downgoing Babinski responses, and intact sensory to primary modalities except for decreased filament touch in the feet bilaterally.  ASSESSMENT:   This patient had amaurosis fugax.   His carotid studies have confirmed this bilaterally at an 80% or higher stenosis of the internal carotid arteries.   The risk factors are evident, coronary artery disease, diabetes mellitus, hypertension, hyperlipidemia.  PLAN:  I advised the patient that with bilateral stenosis he would likely face a surgery/carotid endarterectomy of the symtomatic (affected) side,  which also has an  auscultable bruit.   Aspirin and Plavix daily , of which he already takes aspirin high dose for medical treatment would not replace the  surgical options in a symptomatic carotid stenosis.  Since the MRI did not show any acute strokes, there would be no reason to await any healing process.   The patient is at this time considered a transient ischemic attack.   I recommended to consult the Vascular Surgery, and I will also have Dr. Pearlean Brownie from the Stroke Service follow the patient tomorrow.     Melvyn Novas, M.D.     CD/MEDQ  D:  01/01/2011  T:  01/02/2011  Job:  [id number]  cc: Delia Heady ,M.D.   Dr. Betti Cruz, [facility] Dr Andrey Campanile ,formerly [facility]  Electronically Signed by Melvyn Novas M.D. on  02/11/2011 12:00:30 PM

## 2011-02-13 NOTE — Procedures (Unsigned)
CAROTID DUPLEX EXAM  INDICATION:  Follow up right CEA, performed 01/08/11.  HISTORY: Diabetes:  Yes. Cardiac:  Yes. Hypertension:  Yes. Smoking:  Previous. Previous Surgery:  Right CEA. CV History: Amaurosis Fugax No, Paresthesias No, Hemiparesis No.                                      RIGHT             LEFT Brachial systolic pressure:         190               182 Brachial Doppler waveforms:         WNL               WNL Vertebral direction of flow:        Antegrade         Antegrade DUPLEX VELOCITIES (cm/sec) CCA peak systolic                   121               75 ECA peak systolic                   112               471 ICA peak systolic                   78                365 ICA end diastolic                   20                104 PLAQUE MORPHOLOGY:                  Diffuse           Calcific PLAQUE AMOUNT:                      Minimal           Severe PLAQUE LOCATION:                    CCA/ECA/ICA       ECA/ICA  IMPRESSION: 1. Patent right carotid endarterectomy with mild diffuse plaquing     proximal and distal to the patch. 2. Borderline 80% to 99% left internal carotid artery stenosis;     however, plaque is calcific, and recorded velocities may be under-     estimated. 3. Bilateral vertebral arteries are within normal limits.  ___________________________________________ Janetta Hora Fields, MD  LT/MEDQ  D:  01/22/2011  T:  01/22/2011  Job:  [id number]

## 2011-02-17 NOTE — Discharge Summary (Addendum)
  NAMEOSHAE, SIMMERING NO.:  [id number]  MEDICAL RECORD NO.:  [id number]  LOCATION:  3301                         FACILITY:  [facility]  PHYSICIAN:  Janetta Hora. Fields, MD  DATE OF BIRTH:  13-Feb-1927  DATE OF ADMISSION:  02/09/2011 DATE OF DISCHARGE:  02/10/2011                              DISCHARGE SUMMARY   CHIEF COMPLAINT:  Symptomatic right carotid stenosis with bilateral carotid stenosis.  HISTORY OF PRESENT ILLNESS:  Mr. Scarpelli is an 77 year old gentleman who was admitted on December 31, 2010, with diagnosis of chest pain and ruled out for myocardial infarction.  He relate a story where he had a right eye temporary blindness and it last approximately 5 minutes which occurred a couple weeks prior to admission.  He denied any further neurological episodes of TIA, amaurosis or stroke and he had a right carotid endarterectomy done by Dr. Darrick Penna on December 31, 2010.  He did extremely well and sent home.  He came back to the hospital on February 09, 2011, to have his left carotid done.  The patient was asymptomatic from the left side.  PAST MEDICAL HISTORY: 1. Diabetes. 2. Reflux. 3. Hypertension. 4. Coronary artery disease. 5. Hyperlipidemia. 6. Chronic renal insufficiency. 7. Osteoarthritis and he had a remote history of smoking and he has     not smoked for 40 years.  HOSPITAL COURSE:  The patient was taken to the operating room on February 09, 2011, for a left carotid endarterectomy with Dacron patch angioplasty.  Postoperatively, the patient did extremely well.  He is voiding, ambulating and taking p.o.  His vital signs were stable. Neurologically, he is alert and oriented x3.  He had good and equal strength bilateral upper and lower extremities.  He had no tongue deviation or facial droop.  His wound was healing well and he was discharged to home.  DISCHARGE MEDICATIONS: 1. Glipizide 10 mg daily. 2. Plavix 75 mg daily. 3. Lopressor 25 mg daily. 4.  Amlodipine 10 mg daily. 5. Kombiglyze 6.09/998 mg 1 daily. 6. Lasix 20 mg daily. 7. Amitiza 8 mcg daily. 8. Aspirin 81 mg daily. 9. Dorzolamide eye drops 1 drop to both eyes daily. 10.Percocet.  The patient has a prescription at home from his previous     surgery.  No prescription was given.  FINAL DIAGNOSIS:  Bilateral carotid stenosis asymptomatic on a right, status post right carotid endarterectomy in early September.  On this admission, he had a left carotid endarterectomy and did well without any problems.  All his other medical conditions were stable and controlled with his home medications.  DISPOSITION:  The patient will be discharged to home.  He will follow up with Dr. Darrick Penna in 2 weeks.     Della Goo, PA-C   ______________________________ Janetta Hora Fields, MD    RR/MEDQ  D:  02/10/2011  T:  02/11/2011  Job:  [id number]  Electronically Signed by Della Goo PA on 02/17/2011 02:31:16 PM Electronically Signed by Fabienne Bruns MD on 02/25/2011 11:39:58 AM

## 2011-02-25 ENCOUNTER — Encounter: Payer: Self-pay | Admitting: Vascular Surgery

## 2011-02-25 NOTE — Op Note (Signed)
NAMEHUMBERT, MOROZOV NO.:  [id number]  MEDICAL RECORD NO.:  [id number]  LOCATION:  3301                         FACILITY:  [facility]  PHYSICIAN:  Janetta Hora. Malikiah Debarr, MD  DATE OF BIRTH:  [birth year]  DATE OF PROCEDURE:  02/09/2011 DATE OF DISCHARGE:                              OPERATIVE REPORT   PROCEDURE:  Left carotid endarterectomy.  PREOPERATIVE DIAGNOSIS:  Left internal carotid artery stenosis.  POSTOPERATIVE DIAGNOSIS:  Left internal carotid artery stenosis.  ANESTHESIA:  General.  ASSISTANT:  Della Goo, PA-C  OPERATIVE FINDINGS: 1. Greater than 80% left internal carotid artery stenosis with heavy     calcification. 2. Dacron patch #3 10-French shunt.  OPERATIVE DETAIL:  After obtaining informed consent, the patient was taken to the operating room.  The patient was placed in supine position on the operating table.  After induction of general anesthesia and endotracheal intubation, the Foley catheter was placed.  Next, the patient's entire left neck and chest were prepped and draped in usual sterile fashion.  An oblique incision was made on the left side of the neck just anterior to the border of the left sternocleidomastoid muscle. Incision was carried down through the platysma muscle and sternocleidomastoid muscle was identified and reflected laterally.  The common facial vein was identified.  This was dissected free circumferentially and ligated divided and between silk ties.  The jugular vein was then reflected laterally.  The common carotid artery was dissected free circumferentially at the base of the incision.  The omohyoid muscle was divided with cautery.  The vagus nerve was identified and protected.  Common carotid artery was dissected free circumferentially and an umbilical tape was placed around the this. Dissection was then carried up the level of carotid bifurcation.  The internal carotid artery was dissected free circumferentially  above the level of palpable disease.  The external carotids and superior thyroid arteries were also dissected free circumferentially.  Vessel loops were placed around these.  The patient was given 7000 units of intravenous heparin.  After appropriate circulation time, the internal carotid artery was controlled with a fine bulldog clamp.  The external carotid and Superior thyroid was controlled with vessel loops.  The common carotid artery was controlled with peripheral DeBakey clamp. Longitudinal opening was made in the common carotid artery just below the carotid bifurcation.  The arteriotomy extended distally with Potts scissors.  There was a heavily calcified plaque at the carotid bifurcation with a greater than 80% stenosis.  The arteriotomy was extended past level of plaque.  A 10-French shunt was then brought up in the operative field and threaded into the distal internal carotid artery and allowed to back bleed thoroughly.  There was excellent backbleeding. The shunt was then passed down into the common carotid artery and secured with a Rumel tourniquet.  Shunt was inspected and found to be free of air and flow restored the brain after approximately 6 minutes of ischemia time.  Next, endarterectomy was begun in a suitable plane of the common carotid artery.  The external carotid artery also had a heavily calcified plaque.  This was endarterectomized by eversion technique and a good distal endpoint  was obtained.  Good distal endpoint was also obtained in the distal internal carotid artery.  The plaque was passed off table as specimen.  All loose debris was removed from the carotid bed.  This was thoroughly irrigated with heparinized saline. Dacron patch was then brought up in the operative field and sewn as patch angioplasty using running 6-0 Prolene suture.  Just prior to completion of the patch, the shunt was reoccluded and brought down of the distal internal carotid artery and as  well as common carotid artery was thoroughly back bled and fore bled.  Distal internal carotid artery was controlled with a fine bulldog clamp.  The common carotid artery was controlled with peripheral DeBakey clamp.  The external carotid artery controlled with vessel loops.  Remainder of the patch was completed. Everything was fore bled and back bled and thoroughly flushed.  After the anastomosis was completed, flow was then first restored retrograde from the external carotid artery and then antegrade from common carotid to external carotid and finally after approximately 5-6 cardiac cycles to the internal carotid artery.  There was good Doppler flow through all of these vessels.  Hemostasis was obtained with addition of one repair stitch at the distal anastomosis.  The patient was given 70 mg of protamine for reversal of heparin.  Platysma muscles reapproximated using running 3-0 Vicryl suture.  Skin was closed with 4-0 Vicryl subcuticular stitch.  The patient tolerated the procedure well and there were no other complications.  Sponge and needle counts were correct at the end of the case.  The patient was taken to the recovery room in stable condition.  Instrument, sponge and needle counts were correct at the end of the case.  The patient was moving his upper extremity and lower extremities symmetrically at the end of the case and following commands.     Janetta Hora. Abbagail Scaff, MD     CEF/MEDQ  D:  02/10/2011  T:  02/10/2011  Job:  [id number]  Electronically Signed by Fabienne Bruns MD on 02/25/2011 11:40:04 AM

## 2011-02-26 ENCOUNTER — Ambulatory Visit (INDEPENDENT_AMBULATORY_CARE_PROVIDER_SITE_OTHER): Payer: Medicare Other | Admitting: Vascular Surgery

## 2011-02-26 ENCOUNTER — Encounter: Payer: Self-pay | Admitting: Vascular Surgery

## 2011-02-26 VITALS — BP 201/78 | HR 73 | Resp 16 | Ht 71.0 in | Wt 206.6 lb

## 2011-02-26 DIAGNOSIS — I6529 Occlusion and stenosis of unspecified carotid artery: Secondary | ICD-10-CM

## 2011-02-26 NOTE — Progress Notes (Signed)
Addended by: Phillips Odor on: 02/26/2011 10:45 AM   Modules accepted: Orders

## 2011-02-26 NOTE — Progress Notes (Signed)
[facility] [place] HISTORY AND PHYSICAL   History of Present Illness:  Patient is a 77 y.o. year old male who presents for follow-up after staged carotid endarterectomies. His right carotid endarterectomy was done in August of 2012. His left carotid endarterectomy was done in September of 2012. He still has some mild swelling of the left neck. He also has some occasional pain at the left skull base. He denies any new neurologic events including amaurosis, numbness, or weakness. He had some confusion about restarting his Norvasc and I assured him that he should restart this today. He was hypertensive in the office today.  Past Medical History  Diagnosis Date  . Diabetes mellitus   . GERD (gastroesophageal reflux disease)   . Hyperlipidemia   . Hypertension   . Arthritis   . Diverticulosis   . Hemorrhoids   . Colon polyps   . CAD (coronary artery disease)   . Chronic kidney disease     Past Surgical History  Procedure Date  . Coronary artery bypass graft   . Cholecystectomy   . Total knee arthroplasty     bilateral  . Tonsillectomy   . Carotid endarterectomy 12/31/10    Right  . Carotid endarterectomy 02/09/11    left  . Joint replacement     bilat. knees    Social History History  Substance Use Topics  . Smoking status: Former Smoker -- 0.5 packs/day for 10 years    Types: Cigarettes    Quit date: 01/22/1971  . Smokeless tobacco: Not on file   Comment: smoked in high school  . Alcohol Use: No    Allergies  No Known Allergies   Current Outpatient Prescriptions  Medication Sig Dispense Refill  . aspirin 325 MG tablet Take 325 mg by mouth daily.        . clopidogrel (PLAVIX) 75 MG tablet Take 1 tablet (75 mg total) by mouth daily.  30 tablet  6  . furosemide (LASIX) 20 MG tablet Take 1 tablet (20 mg total) by mouth daily.  90 tablet  3  . glipiZIDE (GLUCOTROL) 10 MG tablet Take 1 tablet (10 mg total) by mouth daily.  90 tablet  3  . glucose  blood (ONE TOUCH TEST STRIPS) test strip 1 each by Other route 2 (two) times daily as needed. Use as instructed       . lubiprostone (AMITIZA) 8 MCG capsule Take 1 capsule (8 mcg total) by mouth daily with breakfast.  90 capsule  3  . metoprolol succinate (TOPROL-XL) 25 MG 24 hr tablet Take 1 tablet (25 mg total) by mouth daily.  30 tablet  6  . ONE TOUCH LANCETS MISC by Does not apply route as directed.        . Saxagliptin-Metformin (KOMBIGLYZE XR) 2.09-998 MG TB24 Take 1 tablet by mouth daily.  90 tablet  3  . amLODipine (NORVASC) 10 MG tablet Take 10 mg by mouth daily.          Physical Examination  Filed Vitals:   02/26/11 0950  BP: 201/78  Pulse: 73  Resp: 16  Height: 5\' 11"  (1.803 m)  Weight: 206 lb 9.6 oz (93.713 kg)    Body mass index is 28.81 kg/(m^2).  General:  Alert and oriented, no acute distress HEENT: Normal Neck: No bruit or JVD, mild left neck swelling, right neck healed Pulmonary: Clear to auscultation bilaterally Cardiac: Regular Rate and Rhythm without murmur Neurologic: Upper and lower extremity motor 5/5 and  symmetric    ASSESSMENT: Doing well status post bilateral carotid endarterectomies   PLAN: He will have a followup carotid duplex scan in 6 months time. He will continue his antiplatelet therapy. He'll also resume his Norvasc as per my discussion with him today.

## 2011-02-27 ENCOUNTER — Other Ambulatory Visit: Payer: Self-pay | Admitting: *Deleted

## 2011-02-27 MED ORDER — AMLODIPINE BESYLATE 10 MG PO TABS: 10.0000 mg | ORAL_TABLET | Freq: Every day | ORAL | Status: DC

## 2011-03-25 ENCOUNTER — Ambulatory Visit (INDEPENDENT_AMBULATORY_CARE_PROVIDER_SITE_OTHER): Payer: Medicare Other | Admitting: Internal Medicine

## 2011-03-25 ENCOUNTER — Encounter: Payer: Self-pay | Admitting: Internal Medicine

## 2011-03-25 VITALS — BP 140/70 | HR 72 | Temp 98.2°F | Resp 16 | Ht 71.0 in | Wt 210.0 lb

## 2011-03-25 DIAGNOSIS — K219 Gastro-esophageal reflux disease without esophagitis: Secondary | ICD-10-CM

## 2011-03-25 DIAGNOSIS — N058 Unspecified nephritic syndrome with other morphologic changes: Secondary | ICD-10-CM

## 2011-03-25 DIAGNOSIS — E785 Hyperlipidemia, unspecified: Secondary | ICD-10-CM

## 2011-03-25 DIAGNOSIS — E1129 Type 2 diabetes mellitus with other diabetic kidney complication: Secondary | ICD-10-CM

## 2011-03-25 DIAGNOSIS — I10 Essential (primary) hypertension: Secondary | ICD-10-CM

## 2011-03-25 MED ORDER — METOPROLOL SUCCINATE ER 25 MG PO TB24: 25.0000 mg | ORAL_TABLET | Freq: Every day | ORAL | Status: DC

## 2011-03-25 MED ORDER — AMLODIPINE BESYLATE 10 MG PO TABS: 10.0000 mg | ORAL_TABLET | Freq: Every day | ORAL | Status: DC

## 2011-03-25 MED ORDER — CHOLINE FENOFIBRATE 135 MG PO CPDR: 135.0000 mg | DELAYED_RELEASE_CAPSULE | Freq: Every day | ORAL | Status: DC

## 2011-03-25 NOTE — Progress Notes (Signed)
  Subjective:    Patient ID: Dean Murphy, male    DOB: 1926/11/26, 77 y.o.   MRN: [id number]  HPI Patient presents for followup of carotid endarterectomy he has a good closure of the wound with slight draining from the lower aspects of the incision without any evidence of cellulitis.  He has a history of adult onset diabetes hypertension and hyperlipidemia.     Review of Systems  Constitutional: Negative for fever and fatigue.  HENT: Negative for hearing loss, congestion, neck pain and postnasal drip.   Eyes: Negative for discharge, redness and visual disturbance.  Respiratory: Negative for cough, shortness of breath and wheezing.   Cardiovascular: Negative for leg swelling.  Gastrointestinal: Negative for abdominal pain, constipation and abdominal distention.  Genitourinary: Negative for urgency and frequency.  Musculoskeletal: Negative for joint swelling and arthralgias.  Skin: Negative for color change and rash.  Neurological: Negative for weakness and light-headedness.  Hematological: Negative for adenopathy.  Psychiatric/Behavioral: Negative for behavioral problems.       Objective:   Physical Exam  Nursing note and vitals reviewed. Constitutional: He appears well-developed and well-nourished.  HENT:  Head: Normocephalic and atraumatic.  Eyes: Conjunctivae are normal. Pupils are equal, round, and reactive to light.  Neck: Normal range of motion. Neck supple.  Cardiovascular: Normal rate and regular rhythm.   Pulmonary/Chest: Effort normal and breath sounds normal.  Abdominal: Soft. Bowel sounds are normal.          Assessment & Plan:  Stable blood pressure status post carotid endarterectomy with no plan on changing medications at this time.  Continued preventive therapy with aggressive control of hypertension diabetes and peripheral vascular disease by controlling cholesterol Lipid panel ordered today including a direct LDL

## 2011-03-25 NOTE — Patient Instructions (Signed)
Patient was instructed to continue all medications as prescribed. To stop at the checkout desk and schedule a followup appointment  

## 2011-03-26 LAB — LIPID PANEL
Cholesterol: 262 mg/dL — ABNORMAL HIGH (ref 0–200)
Total CHOL/HDL Ratio: 6
Triglycerides: 200 mg/dL — ABNORMAL HIGH (ref 0.0–149.0)

## 2011-03-26 LAB — BASIC METABOLIC PANEL
Calcium: 9.6 mg/dL (ref 8.4–10.5)
Chloride: 106 mEq/L (ref 96–112)
Creatinine, Ser: 1 mg/dL (ref 0.4–1.5)

## 2011-03-26 LAB — HEMOGLOBIN A1C: Hgb A1c MFr Bld: 6 % (ref 4.6–6.5)

## 2011-07-23 ENCOUNTER — Ambulatory Visit (INDEPENDENT_AMBULATORY_CARE_PROVIDER_SITE_OTHER): Payer: Medicare Other | Admitting: Internal Medicine

## 2011-07-23 ENCOUNTER — Encounter: Payer: Self-pay | Admitting: Internal Medicine

## 2011-07-23 DIAGNOSIS — E785 Hyperlipidemia, unspecified: Secondary | ICD-10-CM | POA: Diagnosis not present

## 2011-07-23 DIAGNOSIS — K219 Gastro-esophageal reflux disease without esophagitis: Secondary | ICD-10-CM

## 2011-07-23 MED ORDER — OMEPRAZOLE 20 MG PO CPDR: 20.0000 mg | DELAYED_RELEASE_CAPSULE | Freq: Every day | ORAL | Status: DC

## 2011-07-23 MED ORDER — SAXAGLIPTIN-METFORMIN ER 2.5-1000 MG PO TB24: 1.0000 | ORAL_TABLET | Freq: Every day | ORAL | Status: DC

## 2011-07-23 MED ORDER — ROSUVASTATIN CALCIUM 20 MG PO TABS: 20.0000 mg | ORAL_TABLET | ORAL | Status: DC

## 2011-07-23 NOTE — Patient Instructions (Addendum)
The patient is instructed to continue all medications as prescribed. Schedule followup with check out clerk upon leaving the clinic I have sent in a prescription to your pharmacy for the prilosec Going to stop the Trilipix for the cholesterol and go back on Crestor 20 mg Monday and Friday You'll return in 2 months with blood work a week in advance

## 2011-07-23 NOTE — Progress Notes (Signed)
Subjective:    Patient ID: Dean Murphy, male    DOB: 22-Aug-1926, 78 y.o.   MRN: [id number]  HPI Increased indigestion the patient has had episodic sudden nighttime indigestion that he describes as "boiling up in his chest" he began to take over-the-counter Prilosec 20 mg by mouth daily and the symptoms have largely resolved.   he is wondering whether or not he can continue this medication long-term.  He is also followed for hypertension his blood pressure stable today 4 history of hyperlipidemia and a history of adult-onset diabetes and we will monitor today with appropriate blood work   Review of Systems  Constitutional: Negative for fever and fatigue.  HENT: Negative for hearing loss, congestion, neck pain and postnasal drip.   Eyes: Negative for discharge, redness and visual disturbance.  Respiratory: Negative for cough, shortness of breath and wheezing.   Cardiovascular: Negative for leg swelling.  Gastrointestinal: Negative for abdominal pain, constipation and abdominal distention.  Genitourinary: Negative for urgency and frequency.  Musculoskeletal: Negative for joint swelling and arthralgias.  Skin: Negative for color change and rash.  Neurological: Negative for weakness and light-headedness.  Hematological: Negative for adenopathy.  Psychiatric/Behavioral: Negative for behavioral problems.   Past Medical History  Diagnosis Date  . Diabetes mellitus   . GERD (gastroesophageal reflux disease)   . Hyperlipidemia   . Hypertension   . Arthritis   . Diverticulosis   . Hemorrhoids   . Colon polyps   . CAD (coronary artery disease)   . Chronic kidney disease     History   Social History  . Marital Status: Married    Spouse Name: N/A    Number of Children: N/A  . Years of Education: N/A   Occupational History  . retired    Social History Main Topics  . Smoking status: Former Smoker -- 0.5 packs/day for 10 years    Types: Cigarettes    Quit date: 01/22/1971  .  Smokeless tobacco: Not on file   Comment: smoked in high school  . Alcohol Use: No  . Drug Use: No  . Sexually Active: Not on file   Other Topics Concern  . Not on file   Social History Narrative  . No narrative on file    Past Surgical History  Procedure Date  . Coronary artery bypass graft   . Cholecystectomy   . Total knee arthroplasty     bilateral  . Tonsillectomy   . Carotid endarterectomy 12/31/10    Right  . Carotid endarterectomy 02/09/11    left  . Joint replacement     bilat. knees    Family History  Problem Relation Age of Onset  . Diabetes    . Cancer    . Heart disease Mother   . Heart attack Father     No Known Allergies  Current Outpatient Prescriptions on File Prior to Visit  Medication Sig Dispense Refill  . amLODipine (NORVASC) 10 MG tablet Take 1 tablet (10 mg total) by mouth daily.  90 tablet  3  . aspirin 325 MG tablet Take 325 mg by mouth daily.        . Choline Fenofibrate (TRILIPIX) 135 MG capsule Take 1 capsule (135 mg total) by mouth daily.      . furosemide (LASIX) 20 MG tablet Take 1 tablet (20 mg total) by mouth daily.  90 tablet  3  . glipiZIDE (GLUCOTROL) 10 MG tablet Take 1 tablet (10 mg total) by mouth daily.  90  tablet  3  . lubiprostone (AMITIZA) 8 MCG capsule Take 1 capsule (8 mcg total) by mouth daily with breakfast.  90 capsule  3  . metoprolol succinate (TOPROL-XL) 25 MG 24 hr tablet Take 1 tablet (25 mg total) by mouth daily.  90 tablet  3  . Saxagliptin-Metformin (KOMBIGLYZE XR) 2.09-998 MG TB24 Take 1 tablet by mouth daily.  90 tablet  3    BP 138/78  Pulse 72  Temp 98.3 F (36.8 C)  Resp 16  Ht 5\' 10"  (1.778 m)  Wt 210 lb (95.255 kg)  BMI 30.13 kg/m2       Objective:   Physical Exam  Nursing note and vitals reviewed. Constitutional: He appears well-developed and well-nourished.  HENT:  Head: Normocephalic and atraumatic.  Eyes: Conjunctivae are normal. Pupils are equal, round, and reactive to light.  Neck:  Normal range of motion. Neck supple.  Cardiovascular: Normal rate and regular rhythm.   Pulmonary/Chest: Effort normal and breath sounds normal.  Abdominal: Soft. Bowel sounds are normal.          Assessment & Plan:  The use of Prilosec long-term for GERD with symptomatic hiatal hernia is appropriate in this patient.  We will monitor hemoglobin A1c a basic metabolic panel today.  He has a persistent sense of easy fatigability and his musculoskeletal pain has not changed since we stopped the statin and we switched him to trilipix.  He is mild to moderate constipation  His A1c is perfect at 6.0 on kombiglyze 2.09/998

## 2011-08-26 DIAGNOSIS — E119 Type 2 diabetes mellitus without complications: Secondary | ICD-10-CM | POA: Diagnosis not present

## 2011-08-26 DIAGNOSIS — H4011X0 Primary open-angle glaucoma, stage unspecified: Secondary | ICD-10-CM | POA: Diagnosis not present

## 2011-08-26 DIAGNOSIS — H409 Unspecified glaucoma: Secondary | ICD-10-CM | POA: Diagnosis not present

## 2011-08-26 DIAGNOSIS — Z961 Presence of intraocular lens: Secondary | ICD-10-CM | POA: Diagnosis not present

## 2011-08-27 ENCOUNTER — Ambulatory Visit (INDEPENDENT_AMBULATORY_CARE_PROVIDER_SITE_OTHER): Payer: Medicare Other | Admitting: Vascular Surgery

## 2011-08-27 DIAGNOSIS — I6529 Occlusion and stenosis of unspecified carotid artery: Secondary | ICD-10-CM | POA: Diagnosis not present

## 2011-08-27 DIAGNOSIS — Z48812 Encounter for surgical aftercare following surgery on the circulatory system: Secondary | ICD-10-CM

## 2011-08-27 NOTE — Progress Notes (Signed)
Carotid duplex performed @ [facility] 08/27/2011

## 2011-09-04 ENCOUNTER — Encounter: Payer: Self-pay | Admitting: Vascular Surgery

## 2011-09-04 ENCOUNTER — Other Ambulatory Visit: Payer: Self-pay | Admitting: *Deleted

## 2011-09-04 DIAGNOSIS — I6529 Occlusion and stenosis of unspecified carotid artery: Secondary | ICD-10-CM

## 2011-09-04 DIAGNOSIS — Z48812 Encounter for surgical aftercare following surgery on the circulatory system: Secondary | ICD-10-CM

## 2011-09-04 NOTE — Procedures (Unsigned)
CAROTID DUPLEX EXAM  INDICATION:  Carotid stenosis.  HISTORY: Diabetes:  yes Cardiac:  CAD,CABG. Hypertension:  yes Smoking:  previous Previous Surgery:  right carotid endarterectomy on 12/31/2010. Left carotid endarterectomy on 02/09/2011. CV History:  Asymptomatic. Amaurosis Fugax No, Paresthesias No, Hemiparesis No                                      RIGHT             LEFT Brachial systolic pressure:         176               174 Brachial Doppler waveforms:         WNL               WNL Vertebral direction of flow:        Antegrade         antegrade DUPLEX VELOCITIES (cm/sec) CCA peak systolic                   90                122 ECA peak systolic                   132               170 ICA peak systolic                   182               115 ICA end diastolic                   41                29 PLAQUE MORPHOLOGY:                  homogenous        heterogenous PLAQUE AMOUNT:                      moderate          mild PLAQUE LOCATION:                    CCA\ICA         CCA  IMPRESSION: 1. Bilateral common carotid artery disease present with heterogenous     plaque visualized. 2. Right internal carotid artery is patent with history of     endarterectomy, hyperplasia versus homogenous plaque present     suggesting 40% to 59% stenosis. 3. Right external carotid artery is patent. 4. Left external carotid artery stenosis present. 5. Left internal carotid artery is patent with history of     endarterectomy.  No hyperplasia or hemodynamic changes present. 6. Bilateral vertebral arteries are patent and antegrade. 7. Increase in disease on the right since the study on 01/22/2011.  ___________________________________________ Janetta Hora. Fields, MD  SH/MEDQ  D:  08/27/2011  T:  08/27/2011  Job:  [id number]

## 2011-09-23 ENCOUNTER — Ambulatory Visit: Payer: Medicare Other | Admitting: Internal Medicine

## 2011-09-23 ENCOUNTER — Other Ambulatory Visit (INDEPENDENT_AMBULATORY_CARE_PROVIDER_SITE_OTHER): Payer: Medicare Other

## 2011-09-23 DIAGNOSIS — E785 Hyperlipidemia, unspecified: Secondary | ICD-10-CM | POA: Diagnosis not present

## 2011-09-23 LAB — HEPATIC FUNCTION PANEL
ALT: 42 U/L (ref 0–53)
Alkaline Phosphatase: 88 U/L (ref 39–117)
Bilirubin, Direct: 0.1 mg/dL (ref 0.0–0.3)
Total Bilirubin: 0.5 mg/dL (ref 0.3–1.2)
Total Protein: 6.7 g/dL (ref 6.0–8.3)

## 2011-09-23 LAB — LIPID PANEL
Cholesterol: 157 mg/dL (ref 0–200)
LDL Cholesterol: 83 mg/dL (ref 0–99)
VLDL: 29.4 mg/dL (ref 0.0–40.0)

## 2011-09-30 ENCOUNTER — Ambulatory Visit (INDEPENDENT_AMBULATORY_CARE_PROVIDER_SITE_OTHER): Payer: Medicare Other | Admitting: Internal Medicine

## 2011-09-30 ENCOUNTER — Encounter: Payer: Self-pay | Admitting: Internal Medicine

## 2011-09-30 VITALS — BP 150/95 | HR 76 | Temp 98.2°F | Resp 16 | Ht 71.0 in | Wt 214.0 lb

## 2011-09-30 DIAGNOSIS — N182 Chronic kidney disease, stage 2 (mild): Secondary | ICD-10-CM

## 2011-09-30 DIAGNOSIS — E785 Hyperlipidemia, unspecified: Secondary | ICD-10-CM

## 2011-09-30 DIAGNOSIS — E1129 Type 2 diabetes mellitus with other diabetic kidney complication: Secondary | ICD-10-CM | POA: Diagnosis not present

## 2011-09-30 DIAGNOSIS — G4700 Insomnia, unspecified: Secondary | ICD-10-CM

## 2011-09-30 DIAGNOSIS — N058 Unspecified nephritic syndrome with other morphologic changes: Secondary | ICD-10-CM | POA: Diagnosis not present

## 2011-09-30 DIAGNOSIS — K3184 Gastroparesis: Secondary | ICD-10-CM

## 2011-09-30 DIAGNOSIS — I10 Essential (primary) hypertension: Secondary | ICD-10-CM

## 2011-09-30 MED ORDER — LUBIPROSTONE 8 MCG PO CAPS: 8.0000 ug | ORAL_CAPSULE | Freq: Every day | ORAL | Status: DC

## 2011-09-30 MED ORDER — BENAZEPRIL HCL 40 MG PO TABS: 20.0000 mg | ORAL_TABLET | Freq: Every day | ORAL | Status: DC

## 2011-09-30 MED ORDER — GLIPIZIDE 10 MG PO TABS: 10.0000 mg | ORAL_TABLET | Freq: Every day | ORAL | Status: DC

## 2011-09-30 MED ORDER — FUROSEMIDE 20 MG PO TABS: 20.0000 mg | ORAL_TABLET | Freq: Every day | ORAL | Status: DC

## 2011-09-30 NOTE — Patient Instructions (Signed)
Blood pressure is increased off the Lotensin so we're going to go back on a half of the amount you were on 40 mg of benazepril or Lotensin a day now take a half of a tablet or 20 mg a day and keep track of your blood pressure if it begins to drop too low please contact my office work she developed dizziness contact my office

## 2011-09-30 NOTE — Progress Notes (Signed)
Subjective:    Patient ID: Dean Murphy, male    DOB: 02/10/1927, 78 y.o.   MRN: [id number]  HPI This is a routine followup visit 78 year old white male with history of hypertension, hyperlipidemia and adult onset diabetes.  He has complicated diabetes by renal manifestations of renal insufficiency probably secondary to diabetes and hypertension.  Has been classified as stage II renal disease He denies any increased edema shortness of breath or flank pain  Recently he has noticed increased insomnia, he cannot relate this to increased stress or anxiety but has noticed a significant history of no sleep patterns   Review of Systems  Constitutional: Negative for fever and fatigue.  HENT: Negative for hearing loss, congestion, neck pain and postnasal drip.   Eyes: Negative for discharge, redness and visual disturbance.  Respiratory: Negative for cough, shortness of breath and wheezing.   Cardiovascular: Negative for leg swelling.  Gastrointestinal: Negative for abdominal pain, constipation and abdominal distention.  Genitourinary: Negative for urgency and frequency.  Musculoskeletal: Negative for joint swelling and arthralgias.  Skin: Negative for color change and rash.  Neurological: Negative for weakness and light-headedness.  Hematological: Negative for adenopathy.  Psychiatric/Behavioral: Negative for behavioral problems.   Past Medical History  Diagnosis Date  . Diabetes mellitus   . GERD (gastroesophageal reflux disease)   . Hyperlipidemia   . Hypertension   . Arthritis   . Diverticulosis   . Hemorrhoids   . Colon polyps   . CAD (coronary artery disease)   . Chronic kidney disease     History   Social History  . Marital Status: Married    Spouse Name: N/A    Number of Children: N/A  . Years of Education: N/A   Occupational History  . retired    Social History Main Topics  . Smoking status: Former Smoker -- 0.5 packs/day for 10 years    Types: Cigarettes   Quit date: 01/22/1971  . Smokeless tobacco: Not on file   Comment: smoked in high school  . Alcohol Use: No  . Drug Use: No  . Sexually Active: Not on file   Other Topics Concern  . Not on file   Social History Narrative  . No narrative on file    Past Surgical History  Procedure Date  . Coronary artery bypass graft   . Cholecystectomy   . Total knee arthroplasty     bilateral  . Tonsillectomy   . Carotid endarterectomy 12/31/10    Right  . Carotid endarterectomy 02/09/11    left  . Joint replacement     bilat. knees    Family History  Problem Relation Age of Onset  . Diabetes    . Cancer    . Heart disease Mother   . Heart attack Father     No Known Allergies  Current Outpatient Prescriptions on File Prior to Visit  Medication Sig Dispense Refill  . aspirin 325 MG tablet Take 325 mg by mouth daily.        . furosemide (LASIX) 20 MG tablet Take 1 tablet (20 mg total) by mouth daily.  90 tablet  3  . glipiZIDE (GLUCOTROL) 10 MG tablet Take 1 tablet (10 mg total) by mouth daily.  90 tablet  3  . NON FORMULARY daily. careconcepts lancets an d glucometer strips      . omeprazole (PRILOSEC) 20 MG capsule Take 1 capsule (20 mg total) by mouth daily.  90 capsule  3  . benazepril (LOTENSIN) 40  MG tablet Take 0.5 tablets (20 mg total) by mouth daily.  90 tablet  3  . metoprolol succinate (TOPROL-XL) 25 MG 24 hr tablet Take 1 tablet (25 mg total) by mouth daily.  90 tablet  3  . rosuvastatin (CRESTOR) 20 MG tablet Take 1 tablet (20 mg total) by mouth 2 (two) times a week.  30 tablet  3  . Saxagliptin-Metformin (KOMBIGLYZE XR) 2.09-998 MG TB24 Take 1 tablet by mouth daily.  90 tablet  3    BP 150/95  Pulse 76  Temp 98.2 F (36.8 C)  Resp 16  Ht 5\' 11"  (1.803 m)  Wt 214 lb (97.07 kg)  BMI 29.85 kg/m2       Objective:   Physical Exam  Constitutional: He appears well-developed and well-nourished.  HENT:  Head: Normocephalic and atraumatic.  Eyes: Conjunctivae  normal are normal. Pupils are equal, round, and reactive to light.  Neck: Normal range of motion. Neck supple.  Cardiovascular: Normal rate and regular rhythm.   Murmur heard. Pulmonary/Chest: Effort normal and breath sounds normal.  Abdominal: Soft. Bowel sounds are normal.          Assessment & Plan:  Chronic insomnia this could be medication induced but recommend use of melatonin 3 mg by mouth each bedtime.  Stable blood pressure on multiple medications with history of CAD.  Stable diabetes.  Stage II renal disease we will measure a basic metabolic panel today to assess creatinine BUN.  Patient reports that his CBGs have been stable A1c prior to next visit will be ordered

## 2012-01-21 ENCOUNTER — Other Ambulatory Visit (INDEPENDENT_AMBULATORY_CARE_PROVIDER_SITE_OTHER): Payer: Medicare Other

## 2012-01-21 DIAGNOSIS — N058 Unspecified nephritic syndrome with other morphologic changes: Secondary | ICD-10-CM | POA: Diagnosis not present

## 2012-01-21 DIAGNOSIS — I10 Essential (primary) hypertension: Secondary | ICD-10-CM | POA: Diagnosis not present

## 2012-01-21 DIAGNOSIS — E1129 Type 2 diabetes mellitus with other diabetic kidney complication: Secondary | ICD-10-CM | POA: Diagnosis not present

## 2012-01-21 LAB — BASIC METABOLIC PANEL
BUN: 23 mg/dL (ref 6–23)
Chloride: 107 mEq/L (ref 96–112)
Creatinine, Ser: 1.6 mg/dL — ABNORMAL HIGH (ref 0.4–1.5)
Glucose, Bld: 142 mg/dL — ABNORMAL HIGH (ref 70–99)
Potassium: 5.2 mEq/L — ABNORMAL HIGH (ref 3.5–5.1)

## 2012-02-03 ENCOUNTER — Ambulatory Visit (INDEPENDENT_AMBULATORY_CARE_PROVIDER_SITE_OTHER): Payer: Medicare Other | Admitting: Internal Medicine

## 2012-02-03 ENCOUNTER — Encounter: Payer: Self-pay | Admitting: Internal Medicine

## 2012-02-03 ENCOUNTER — Other Ambulatory Visit: Payer: Self-pay | Admitting: *Deleted

## 2012-02-03 VITALS — BP 140/80 | HR 72 | Temp 98.2°F | Resp 16 | Ht 71.0 in | Wt 208.0 lb

## 2012-02-03 DIAGNOSIS — I10 Essential (primary) hypertension: Secondary | ICD-10-CM

## 2012-02-03 DIAGNOSIS — E1129 Type 2 diabetes mellitus with other diabetic kidney complication: Secondary | ICD-10-CM

## 2012-02-03 DIAGNOSIS — E785 Hyperlipidemia, unspecified: Secondary | ICD-10-CM

## 2012-02-03 DIAGNOSIS — N058 Unspecified nephritic syndrome with other morphologic changes: Secondary | ICD-10-CM

## 2012-02-03 DIAGNOSIS — Z23 Encounter for immunization: Secondary | ICD-10-CM | POA: Diagnosis not present

## 2012-02-03 DIAGNOSIS — N182 Chronic kidney disease, stage 2 (mild): Secondary | ICD-10-CM

## 2012-02-03 DIAGNOSIS — IMO0001 Reserved for inherently not codable concepts without codable children: Secondary | ICD-10-CM

## 2012-02-03 MED ORDER — AMLODIPINE BESYLATE 10 MG PO TABS: 10.0000 mg | ORAL_TABLET | Freq: Every day | ORAL | Status: DC

## 2012-02-03 MED ORDER — SAXAGLIPTIN-METFORMIN ER 2.5-1000 MG PO TB24: 1.0000 | ORAL_TABLET | Freq: Every day | ORAL | Status: DC

## 2012-02-03 MED ORDER — METOPROLOL SUCCINATE ER 25 MG PO TB24: 25.0000 mg | ORAL_TABLET | Freq: Every day | ORAL | Status: DC

## 2012-02-03 MED ORDER — ROSUVASTATIN CALCIUM 20 MG PO TABS: 20.0000 mg | ORAL_TABLET | ORAL | Status: DC

## 2012-02-03 NOTE — Patient Instructions (Signed)
The patient is instructed to continue all medications as prescribed. Schedule followup with check out clerk upon leaving the clinic  

## 2012-02-03 NOTE — Progress Notes (Signed)
Subjective:    Patient ID: Dean Murphy, male    DOB: 1926-11-05, 78 y.o.   MRN: [id number]  HPI  Patient is an 78 year old male followed for hypertension adult-onset diabetes with gastroparesis and renal insufficiency.  His creatinine bumped from 1.0 and 1.6 his blood glucose on fasting labs was 142 and he states that his AM CBG has been as high as 170. He has been out of his medications for approximately 2 weeks I believe that the elevated blood sugars at explained the dehydration picture the increased creatinine and increased potassium.  We will resume his combiglyze  Review of Systems  Constitutional: Negative for fever and fatigue.  HENT: Negative for hearing loss, congestion, neck pain and postnasal drip.   Eyes: Negative for discharge, redness and visual disturbance.  Respiratory: Negative for cough, shortness of breath and wheezing.   Cardiovascular: Negative for leg swelling.  Gastrointestinal: Negative for abdominal pain, constipation and abdominal distention.  Genitourinary: Negative for urgency and frequency.  Musculoskeletal: Negative for joint swelling and arthralgias.  Skin: Negative for color change and rash.  Neurological: Negative for weakness and light-headedness.  Hematological: Negative for adenopathy.  Psychiatric/Behavioral: Negative for behavioral problems.   Past Medical History  Diagnosis Date  . Diabetes mellitus   . GERD (gastroesophageal reflux disease)   . Hyperlipidemia   . Hypertension   . Arthritis   . Diverticulosis   . Hemorrhoids   . Colon polyps   . CAD (coronary artery disease)   . Chronic kidney disease     History   Social History  . Marital Status: Married    Spouse Name: N/A    Number of Children: N/A  . Years of Education: N/A   Occupational History  . retired    Social History Main Topics  . Smoking status: Former Smoker -- 0.5 packs/day for 10 years    Types: Cigarettes    Quit date: 01/22/1971  . Smokeless tobacco:  Not on file   Comment: smoked in high school  . Alcohol Use: No  . Drug Use: No  . Sexually Active: Not on file   Other Topics Concern  . Not on file   Social History Narrative  . No narrative on file    Past Surgical History  Procedure Date  . Coronary artery bypass graft   . Cholecystectomy   . Total knee arthroplasty     bilateral  . Tonsillectomy   . Carotid endarterectomy 12/31/10    Right  . Carotid endarterectomy 02/09/11    left  . Joint replacement     bilat. knees    Family History  Problem Relation Age of Onset  . Diabetes    . Cancer    . Heart disease Mother   . Heart attack Father     No Known Allergies  Current Outpatient Prescriptions on File Prior to Visit  Medication Sig Dispense Refill  . aspirin 325 MG tablet Take 325 mg by mouth daily.        . benazepril (LOTENSIN) 40 MG tablet Take 0.5 tablets (20 mg total) by mouth daily.  90 tablet  3  . furosemide (LASIX) 20 MG tablet Take 1 tablet (20 mg total) by mouth daily.  90 tablet  3  . glipiZIDE (GLUCOTROL) 10 MG tablet Take 1 tablet (10 mg total) by mouth daily.  90 tablet  3  . lubiprostone (AMITIZA) 8 MCG capsule Take 1 capsule (8 mcg total) by mouth daily with breakfast.  90  capsule  3  . NON FORMULARY daily. careconcepts lancets an d glucometer strips      . omeprazole (PRILOSEC) 20 MG capsule Take 1 capsule (20 mg total) by mouth daily.  90 capsule  3  . DISCONTD: metoprolol succinate (TOPROL-XL) 25 MG 24 hr tablet Take 1 tablet (25 mg total) by mouth daily.  90 tablet  3  . DISCONTD: rosuvastatin (CRESTOR) 20 MG tablet Take 1 tablet (20 mg total) by mouth 2 (two) times a week.  90 tablet  3  . DISCONTD: Saxagliptin-Metformin (KOMBIGLYZE XR) 2.09-998 MG TB24 Take 1 tablet by mouth daily.  90 tablet  3    BP 140/80  Pulse 72  Temp 98.2 F (36.8 C)  Resp 16  Ht 5\' 11"  (1.803 m)  Wt 208 lb (94.348 kg)  BMI 29.01 kg/m2        Objective:   Physical Exam  Nursing note and vitals  reviewed. Constitutional: He appears well-developed and well-nourished.  HENT:  Head: Normocephalic and atraumatic.  Eyes: Conjunctivae normal are normal. Pupils are equal, round, and reactive to light.  Neck: Normal range of motion. Neck supple.  Cardiovascular: Normal rate and regular rhythm.   Pulmonary/Chest: Effort normal and breath sounds normal.  Abdominal: Soft. Bowel sounds are normal.  Musculoskeletal: Normal range of motion.    Trigger finger noted      Assessment & Plan:  We'll resume his diabetic medication and recheck a basic metabolic panel in 5-6 weeks.  If the creatinine returned to baseline approximately 1.1 we will not need any further intervention if the creatinine continues to climb a renal consult may be warranted.  Blood pressure is well controlled no symptoms of shortness of breath PND orthopnea chest pain.Marland KitchenMarland Kitchen

## 2012-02-22 DIAGNOSIS — H409 Unspecified glaucoma: Secondary | ICD-10-CM | POA: Diagnosis not present

## 2012-02-22 DIAGNOSIS — E119 Type 2 diabetes mellitus without complications: Secondary | ICD-10-CM | POA: Diagnosis not present

## 2012-02-22 DIAGNOSIS — H4011X0 Primary open-angle glaucoma, stage unspecified: Secondary | ICD-10-CM | POA: Diagnosis not present

## 2012-03-15 ENCOUNTER — Other Ambulatory Visit: Payer: Self-pay | Admitting: *Deleted

## 2012-03-15 ENCOUNTER — Ambulatory Visit (INDEPENDENT_AMBULATORY_CARE_PROVIDER_SITE_OTHER)
Admission: RE | Admit: 2012-03-15 | Discharge: 2012-03-15 | Disposition: A | Discharge status: Home / Self Care | Payer: Medicare Other | Source: Ambulatory Visit | Attending: Internal Medicine | Admitting: Internal Medicine

## 2012-03-15 DIAGNOSIS — S4980XA Other specified injuries of shoulder and upper arm, unspecified arm, initial encounter: Secondary | ICD-10-CM | POA: Diagnosis not present

## 2012-03-15 DIAGNOSIS — S43429A Sprain of unspecified rotator cuff capsule, initial encounter: Secondary | ICD-10-CM | POA: Diagnosis not present

## 2012-03-15 DIAGNOSIS — M25512 Pain in left shoulder: Secondary | ICD-10-CM

## 2012-03-15 DIAGNOSIS — M25519 Pain in unspecified shoulder: Secondary | ICD-10-CM

## 2012-03-21 DIAGNOSIS — M19019 Primary osteoarthritis, unspecified shoulder: Secondary | ICD-10-CM | POA: Diagnosis not present

## 2012-03-23 ENCOUNTER — Ambulatory Visit (INDEPENDENT_AMBULATORY_CARE_PROVIDER_SITE_OTHER): Payer: Medicare Other | Admitting: Family

## 2012-03-23 ENCOUNTER — Other Ambulatory Visit (INDEPENDENT_AMBULATORY_CARE_PROVIDER_SITE_OTHER): Payer: Medicare Other

## 2012-03-23 ENCOUNTER — Telehealth: Payer: Self-pay | Admitting: Internal Medicine

## 2012-03-23 ENCOUNTER — Encounter: Payer: Self-pay | Admitting: Family

## 2012-03-23 VITALS — BP 138/68 | HR 78 | Temp 98.1°F | Wt 208.0 lb

## 2012-03-23 DIAGNOSIS — M67919 Unspecified disorder of synovium and tendon, unspecified shoulder: Secondary | ICD-10-CM | POA: Diagnosis not present

## 2012-03-23 DIAGNOSIS — M791 Myalgia, unspecified site: Secondary | ICD-10-CM

## 2012-03-23 DIAGNOSIS — M719 Bursopathy, unspecified: Secondary | ICD-10-CM | POA: Diagnosis not present

## 2012-03-23 DIAGNOSIS — IMO0001 Reserved for inherently not codable concepts without codable children: Secondary | ICD-10-CM | POA: Diagnosis not present

## 2012-03-23 DIAGNOSIS — R5381 Other malaise: Secondary | ICD-10-CM | POA: Diagnosis not present

## 2012-03-23 DIAGNOSIS — N182 Chronic kidney disease, stage 2 (mild): Secondary | ICD-10-CM

## 2012-03-23 DIAGNOSIS — Z8619 Personal history of other infectious and parasitic diseases: Secondary | ICD-10-CM | POA: Diagnosis not present

## 2012-03-23 DIAGNOSIS — R5383 Other fatigue: Secondary | ICD-10-CM | POA: Diagnosis not present

## 2012-03-23 LAB — BASIC METABOLIC PANEL
BUN: 25 mg/dL — ABNORMAL HIGH (ref 6–23)
Calcium: 9.4 mg/dL (ref 8.4–10.5)
Creatinine, Ser: 1.5 mg/dL (ref 0.4–1.5)
GFR: 47.14 mL/min — ABNORMAL LOW (ref >60.00)
Glucose, Bld: 124 mg/dL — ABNORMAL HIGH (ref 70–99)
Potassium: 4.8 mEq/L (ref 3.5–5.1)

## 2012-03-23 LAB — CBC
HCT: 41.7 % (ref 39.0–52.0)
MCHC: 33 g/dL (ref 30.0–36.0)
MCV: 92 fl (ref 78.0–100.0)
RBC: 4.53 Mil/uL (ref 4.22–5.81)
RDW: 12.6 % (ref 11.5–14.6)

## 2012-03-23 LAB — HEMOGLOBIN A1C: Hgb A1c MFr Bld: 6.7 % — ABNORMAL HIGH (ref 4.6–6.5)

## 2012-03-23 MED ORDER — DOXYCYCLINE HYCLATE 100 MG PO TABS: 100.0000 mg | ORAL_TABLET | Freq: Two times a day (BID) | ORAL | Status: DC

## 2012-03-23 NOTE — Telephone Encounter (Signed)
Pt is going to see padonda today- has weakness like he felt when he had lime desease/bmw

## 2012-03-23 NOTE — Patient Instructions (Addendum)
The patient is instructed to continue all medications as prescribed. Schedule followup with check out clerk upon leaving the clinic  

## 2012-03-23 NOTE — Telephone Encounter (Signed)
Pt came by today to get lab work done today and said that his appt that he had sch with Dr Lovell Sheehan was rschd because pcp is going to be out of office. Pt schd for ov in January now and would like a work in appt with pcp sooner. Pls advise.

## 2012-03-24 ENCOUNTER — Encounter: Payer: Self-pay | Admitting: Family

## 2012-03-24 NOTE — Progress Notes (Signed)
  Subjective:    Patient ID: Dean Murphy, male    DOB: [birth year], 78 y.o.   MRN: [id number]  HPI 78 year old white male, nonsmoker, patient of Dr. Lovell Sheehan is in today with c/o fatigue and myalgias x 3 weeks. He is concerned that Lymes disease has returned. Was treated for Lyme's Disease 1 year ago when he had very similar symptoms. Symptoms improved with Doxycycline.    Review of Systems  Constitutional: Positive for fatigue. Negative for fever and appetite change.  Respiratory: Negative.   Cardiovascular: Negative.   Gastrointestinal: Negative.  Negative for nausea, vomiting and abdominal distention.  Musculoskeletal: Positive for myalgias and arthralgias.  Skin: Negative.   Neurological: Negative.  Negative for light-headedness and headaches.  Hematological: Negative.   Psychiatric/Behavioral: Negative.        Objective:   Physical Exam  Constitutional: He is oriented to person, place, and time. He appears well-developed.  HENT:  Right Ear: External ear normal.  Left Ear: External ear normal.  Nose: Nose normal.  Mouth/Throat: Oropharynx is clear and moist.  Cardiovascular: Normal rate, regular rhythm and normal heart sounds.   Pulmonary/Chest: Effort normal and breath sounds normal.  Abdominal: Soft. Bowel sounds are normal.  Musculoskeletal: Normal range of motion.  Neurological: He is alert and oriented to person, place, and time.  Skin: Skin is warm and dry.  Psychiatric: He has a normal mood and affect.          Assessment & Plan:  Assessment: Fatigue, Myalgias   Plan: CBC sent. Other labs were obtained this morning and are stable.

## 2012-04-18 ENCOUNTER — Telehealth: Payer: Self-pay | Admitting: Internal Medicine

## 2012-04-18 ENCOUNTER — Ambulatory Visit (INDEPENDENT_AMBULATORY_CARE_PROVIDER_SITE_OTHER)
Admission: RE | Admit: 2012-04-18 | Discharge: 2012-04-18 | Disposition: A | Discharge status: Home / Self Care | Payer: Medicare Other | Source: Ambulatory Visit | Attending: Internal Medicine | Admitting: Internal Medicine

## 2012-04-18 DIAGNOSIS — R05 Cough: Secondary | ICD-10-CM

## 2012-04-18 DIAGNOSIS — R042 Hemoptysis: Secondary | ICD-10-CM | POA: Diagnosis not present

## 2012-04-18 DIAGNOSIS — R0602 Shortness of breath: Secondary | ICD-10-CM | POA: Diagnosis not present

## 2012-04-18 NOTE — Telephone Encounter (Signed)
Patient Information:  Caller Name: Drayce  Phone: [phone]  Patient: Dean Murphy, Dean Murphy  Gender: Male  DOB: 1926/07/19  Age: 78 Years  PCP: Darryll Capers (Adults only)   Symptoms  Reason For Call & Symptoms: sore all over, i think the Lyme Disease is come back.  Pt reports he has been vomiting up blood  Reviewed Health History In EMR: No  Reviewed Medications In EMR: No  Reviewed Allergies In EMR: No  Date of Onset of Symptoms: 03/18/2012  Guideline(s) Used:  Vomiting  Disposition Per Guideline:   See Today in Office  Reason For Disposition Reached:   Patient wants to be seen  Advice Given:  N/A  Office Follow Up:  Does the office need to follow up with this patient?: Yes  Instructions For The Office: Pt wants to see Dr Lovell Sheehan.  Pt states he has been treated for Lyme Disease and feels it has returned.  No appt found with Dr Lovell Sheehan or PA Orvan Falconer.  OFFICE PLEASE FOLLOW UP TO SEE IT PT CAN BE WORKED IN TODAY.  RN Note:  Pt reports he was vomiting blood last week; last time was on 04/15/12.  Pt states he is sore all over. Pt is adamant about being seen by Dr Lovell Sheehan today.

## 2012-04-18 NOTE — Telephone Encounter (Signed)
Error/kjh 

## 2012-04-18 NOTE — Telephone Encounter (Signed)
Per dr Lovell Sheehan- have cxr this pm and see padonda in am

## 2012-04-19 ENCOUNTER — Ambulatory Visit (INDEPENDENT_AMBULATORY_CARE_PROVIDER_SITE_OTHER): Payer: Medicare Other | Admitting: Family

## 2012-04-19 ENCOUNTER — Encounter: Payer: Self-pay | Admitting: Family

## 2012-04-19 VITALS — BP 156/80 | HR 84 | Temp 97.6°F | Wt 203.0 lb

## 2012-04-19 DIAGNOSIS — IMO0001 Reserved for inherently not codable concepts without codable children: Secondary | ICD-10-CM

## 2012-04-19 DIAGNOSIS — M791 Myalgia, unspecified site: Secondary | ICD-10-CM

## 2012-04-19 DIAGNOSIS — J189 Pneumonia, unspecified organism: Secondary | ICD-10-CM

## 2012-04-19 DIAGNOSIS — R5381 Other malaise: Secondary | ICD-10-CM

## 2012-04-19 DIAGNOSIS — R5383 Other fatigue: Secondary | ICD-10-CM

## 2012-04-19 MED ORDER — MOXIFLOXACIN HCL 400 MG PO TABS: 400.0000 mg | ORAL_TABLET | Freq: Every day | ORAL | Status: DC

## 2012-04-19 NOTE — Progress Notes (Signed)
Subjective:    Patient ID: Dean Murphy, male    DOB: Jun 18, 1926, 78 y.o.   MRN: [id number]  HPI 78 year old white male, nonsmoker, patient of Dr. Lovell Sheehan is in today with complaints of cough, congestion that began 2 weeks ago. His cough has subsided. However, he continues to have body aches fever and chills. Has not taken any medication for relief.    Review of Systems  Constitutional: Positive for fever and fatigue.  HENT: Positive for congestion. Negative for sore throat, sneezing and sinus pressure.   Eyes: Negative.   Respiratory: Positive for cough.   Cardiovascular: Negative.   Gastrointestinal: Negative.   Musculoskeletal: Positive for myalgias.  Skin: Negative.   Neurological: Negative.   Hematological: Negative.   Psychiatric/Behavioral: Negative.    Past Medical History  Diagnosis Date  . Diabetes mellitus   . GERD (gastroesophageal reflux disease)   . Hyperlipidemia   . Hypertension   . Arthritis   . Diverticulosis   . Hemorrhoids   . Colon polyps   . CAD (coronary artery disease)   . Chronic kidney disease     History   Social History  . Marital Status: Married    Spouse Name: N/A    Number of Children: N/A  . Years of Education: N/A   Occupational History  . retired    Social History Main Topics  . Smoking status: Former Smoker -- 0.5 packs/day for 10 years    Types: Cigarettes    Quit date: 01/22/1971  . Smokeless tobacco: Not on file     Comment: smoked in high school  . Alcohol Use: No  . Drug Use: No  . Sexually Active: Not on file   Other Topics Concern  . Not on file   Social History Narrative  . No narrative on file    Past Surgical History  Procedure Date  . Coronary artery bypass graft   . Cholecystectomy   . Total knee arthroplasty     bilateral  . Tonsillectomy   . Carotid endarterectomy 12/31/10    Right  . Carotid endarterectomy 02/09/11    left  . Joint replacement     bilat. knees    Family History  Problem  Relation Age of Onset  . Diabetes    . Cancer    . Heart disease Mother   . Heart attack Father     No Known Allergies  Current Outpatient Prescriptions on File Prior to Visit  Medication Sig Dispense Refill  . amLODipine (NORVASC) 10 MG tablet Take 1 tablet (10 mg total) by mouth daily.  90 tablet  3  . aspirin 325 MG tablet Take 325 mg by mouth daily.        . benazepril (LOTENSIN) 40 MG tablet Take 0.5 tablets (20 mg total) by mouth daily.  90 tablet  3  . doxycycline (VIBRA-TABS) 100 MG tablet Take 1 tablet (100 mg total) by mouth 2 (two) times daily.  14 tablet  0  . furosemide (LASIX) 20 MG tablet Take 1 tablet (20 mg total) by mouth daily.  90 tablet  3  . glipiZIDE (GLUCOTROL) 10 MG tablet Take 1 tablet (10 mg total) by mouth daily.  90 tablet  3  . lubiprostone (AMITIZA) 8 MCG capsule Take 1 capsule (8 mcg total) by mouth daily with breakfast.  90 capsule  3  . metoprolol succinate (TOPROL-XL) 25 MG 24 hr tablet Take 1 tablet (25 mg total) by mouth daily.  90 tablet  3  . NON FORMULARY daily. careconcepts lancets an d glucometer strips      . omeprazole (PRILOSEC) 20 MG capsule Take 1 capsule (20 mg total) by mouth daily.  90 capsule  3  . rosuvastatin (CRESTOR) 20 MG tablet Take 1 tablet (20 mg total) by mouth 2 (two) times a week.  30 tablet  3  . Saxagliptin-Metformin (KOMBIGLYZE XR) 2.09-998 MG TB24 Take 1 tablet by mouth daily.  90 tablet  3    BP 156/80  Pulse 84  Temp 97.6 F (36.4 C) (Oral)  Wt 203 lb (92.08 kg)  SpO2 95%chart    Objective:   Physical Exam  Constitutional: He is oriented to person, place, and time. He appears well-developed and well-nourished.  HENT:  Right Ear: External ear normal.  Left Ear: External ear normal.  Nose: Nose normal.  Mouth/Throat: Oropharynx is clear and moist.  Neck: Normal range of motion. Neck supple.  Cardiovascular: Normal rate, regular rhythm and normal heart sounds.   Pulmonary/Chest: Effort normal and breath sounds  normal.  Abdominal: Soft. Bowel sounds are normal.  Neurological: He is alert and oriented to person, place, and time.  Skin: Skin is warm and dry.  Psychiatric: He has a normal mood and affect.          Assessment & Plan:  Assessment: Pneumonia, myalgias, fatigue  Plan: Avelox 400 mg one tablet a day x10 days. Ibuprofen as needed for muscle aches and pain. Patient to call the office if his symptoms worsen or persist. Recheck a schedule, and when necessary.

## 2012-04-19 NOTE — Patient Instructions (Signed)

## 2012-05-04 ENCOUNTER — Ambulatory Visit: Payer: Medicare Other | Admitting: Internal Medicine

## 2012-05-10 DIAGNOSIS — M542 Cervicalgia: Secondary | ICD-10-CM | POA: Diagnosis not present

## 2012-05-10 DIAGNOSIS — M719 Bursopathy, unspecified: Secondary | ICD-10-CM | POA: Diagnosis not present

## 2012-05-10 DIAGNOSIS — M67919 Unspecified disorder of synovium and tendon, unspecified shoulder: Secondary | ICD-10-CM | POA: Diagnosis not present

## 2012-06-02 ENCOUNTER — Ambulatory Visit (INDEPENDENT_AMBULATORY_CARE_PROVIDER_SITE_OTHER): Payer: Medicare Other | Admitting: Internal Medicine

## 2012-06-02 ENCOUNTER — Encounter: Payer: Self-pay | Admitting: Internal Medicine

## 2012-06-02 VITALS — BP 164/90 | HR 80 | Temp 98.2°F | Resp 16 | Ht 71.0 in | Wt 202.0 lb

## 2012-06-02 DIAGNOSIS — I10 Essential (primary) hypertension: Secondary | ICD-10-CM | POA: Diagnosis not present

## 2012-06-02 DIAGNOSIS — N476 Balanoposthitis: Secondary | ICD-10-CM

## 2012-06-02 DIAGNOSIS — IMO0001 Reserved for inherently not codable concepts without codable children: Secondary | ICD-10-CM

## 2012-06-02 DIAGNOSIS — T887XXA Unspecified adverse effect of drug or medicament, initial encounter: Secondary | ICD-10-CM

## 2012-06-02 DIAGNOSIS — M609 Myositis, unspecified: Secondary | ICD-10-CM

## 2012-06-02 DIAGNOSIS — M353 Polymyalgia rheumatica: Secondary | ICD-10-CM | POA: Diagnosis not present

## 2012-06-02 DIAGNOSIS — N481 Balanitis: Secondary | ICD-10-CM

## 2012-06-02 LAB — SEDIMENTATION RATE: Sed Rate: 9 mm/hr (ref 0–22)

## 2012-06-02 LAB — BASIC METABOLIC PANEL
BUN: 22 mg/dL (ref 6–23)
Calcium: 9.4 mg/dL (ref 8.4–10.5)
GFR: 63.25 mL/min (ref >60.00)
Glucose, Bld: 153 mg/dL — ABNORMAL HIGH (ref 70–99)
Sodium: 139 mEq/L (ref 135–145)

## 2012-06-02 MED ORDER — PREDNISONE 5 MG PO TABS: 5.0000 mg | ORAL_TABLET | Freq: Every day | ORAL | Status: DC

## 2012-06-02 MED ORDER — CLOTRIMAZOLE-BETAMETHASONE 1-0.05 % EX CREA: TOPICAL_CREAM | Freq: Two times a day (BID) | CUTANEOUS | Status: DC

## 2012-06-02 NOTE — Progress Notes (Signed)
Subjective:    Patient ID: Dean Murphy, male    DOB: [birth year], 79 y.o.   MRN: [id number]  HPI Should initially started with symptomology in December. He    Review of Systems  Constitutional: Negative for fever and fatigue.  HENT: Negative for hearing loss, congestion, neck pain and postnasal drip.   Eyes: Negative for discharge, redness and visual disturbance.  Respiratory: Positive for cough. Negative for shortness of breath and wheezing.   Cardiovascular: Negative for leg swelling.  Gastrointestinal: Negative for abdominal pain, constipation and abdominal distention.  Genitourinary: Negative for urgency and frequency.  Musculoskeletal: Positive for myalgias, joint swelling and arthralgias.  Skin: Negative for color change and rash.  Neurological: Positive for weakness. Negative for light-headedness.  Hematological: Negative for adenopathy.  Psychiatric/Behavioral: Negative for behavioral problems.       Past Medical History  Diagnosis Date  . Diabetes mellitus   . GERD (gastroesophageal reflux disease)   . Hyperlipidemia   . Hypertension   . Arthritis   . Diverticulosis   . Hemorrhoids   . Colon polyps   . CAD (coronary artery disease)   . Chronic kidney disease     History   Social History  . Marital Status: Married    Spouse Name: N/A    Number of Children: N/A  . Years of Education: N/A   Occupational History  . retired    Social History Main Topics  . Smoking status: Former Smoker -- 0.5 packs/day for 10 years    Types: Cigarettes    Quit date: 01/22/1971  . Smokeless tobacco: Not on file     Comment: smoked in high school  . Alcohol Use: No  . Drug Use: No  . Sexually Active: Not on file   Other Topics Concern  . Not on file   Social History Narrative  . No narrative on file    Past Surgical History  Procedure Date  . Coronary artery bypass graft   . Cholecystectomy   . Total knee arthroplasty     bilateral  . Tonsillectomy   .  Carotid endarterectomy 12/31/10    Right  . Carotid endarterectomy 02/09/11    left  . Joint replacement     bilat. knees    Family History  Problem Relation Age of Onset  . Diabetes    . Cancer    . Heart disease Mother   . Heart attack Father     No Known Allergies  Current Outpatient Prescriptions on File Prior to Visit  Medication Sig Dispense Refill  . amLODipine (NORVASC) 10 MG tablet Take 1 tablet (10 mg total) by mouth daily.  90 tablet  3  . aspirin 325 MG tablet Take 325 mg by mouth daily.        . benazepril (LOTENSIN) 40 MG tablet Take 0.5 tablets (20 mg total) by mouth daily.  90 tablet  3  . furosemide (LASIX) 20 MG tablet Take 1 tablet (20 mg total) by mouth daily.  90 tablet  3  . glipiZIDE (GLUCOTROL) 10 MG tablet Take 1 tablet (10 mg total) by mouth daily.  90 tablet  3  . lubiprostone (AMITIZA) 8 MCG capsule Take 1 capsule (8 mcg total) by mouth daily with breakfast.  90 capsule  3  . metoprolol succinate (TOPROL-XL) 25 MG 24 hr tablet Take 1 tablet (25 mg total) by mouth daily.  90 tablet  3  . NON FORMULARY daily. careconcepts lancets an d glucometer strips      .  omeprazole (PRILOSEC) 20 MG capsule Take 1 capsule (20 mg total) by mouth daily.  90 capsule  3  . Saxagliptin-Metformin (KOMBIGLYZE XR) 2.09-998 MG TB24 Take 1 tablet by mouth daily.  90 tablet  3    BP 164/90  Pulse 80  Temp 98.2 F (36.8 C)  Resp 16  Ht 5\' 11"  (1.803 m)  Wt 202 lb (91.627 kg)  BMI 28.17 kg/m2    Objective:   Physical Exam  Cardiovascular:  Murmur heard. Pulmonary/Chest: Effort normal and breath sounds normal.  Musculoskeletal: He exhibits edema and tenderness.    Fungal balantitis      Assessment & Plan:    Fungal balanitis use Lotrimin cream Monitor blood glucose on prednisone for inflammation Stop crestor Screen for RA Screen CK

## 2012-06-02 NOTE — Patient Instructions (Signed)
Stop the crestor

## 2012-06-03 LAB — CYCLIC CITRUL PEPTIDE ANTIBODY, IGG: Cyclic Citrullin Peptide Ab: <2 U/mL (ref 0.0–5.0)

## 2012-06-06 ENCOUNTER — Ambulatory Visit: Payer: Medicare Other | Admitting: Internal Medicine

## 2012-08-11 DIAGNOSIS — L723 Sebaceous cyst: Secondary | ICD-10-CM | POA: Diagnosis not present

## 2012-08-11 DIAGNOSIS — L0390 Cellulitis, unspecified: Secondary | ICD-10-CM | POA: Diagnosis not present

## 2012-08-22 ENCOUNTER — Encounter: Payer: Self-pay | Admitting: Internal Medicine

## 2012-08-22 ENCOUNTER — Ambulatory Visit (INDEPENDENT_AMBULATORY_CARE_PROVIDER_SITE_OTHER): Payer: Medicare Other | Admitting: Internal Medicine

## 2012-08-22 VITALS — BP 130/70 | HR 76 | Temp 98.6°F | Resp 16 | Ht 71.0 in | Wt 210.0 lb

## 2012-08-22 DIAGNOSIS — M609 Myositis, unspecified: Secondary | ICD-10-CM

## 2012-08-22 DIAGNOSIS — N481 Balanitis: Secondary | ICD-10-CM

## 2012-08-22 DIAGNOSIS — M353 Polymyalgia rheumatica: Secondary | ICD-10-CM | POA: Diagnosis not present

## 2012-08-22 DIAGNOSIS — N411 Chronic prostatitis: Secondary | ICD-10-CM

## 2012-08-22 DIAGNOSIS — I10 Essential (primary) hypertension: Secondary | ICD-10-CM

## 2012-08-22 DIAGNOSIS — K219 Gastro-esophageal reflux disease without esophagitis: Secondary | ICD-10-CM

## 2012-08-22 DIAGNOSIS — E1129 Type 2 diabetes mellitus with other diabetic kidney complication: Secondary | ICD-10-CM | POA: Diagnosis not present

## 2012-08-22 MED ORDER — OMEPRAZOLE 20 MG PO CPDR: 20.0000 mg | DELAYED_RELEASE_CAPSULE | Freq: Every day | ORAL | Status: DC

## 2012-08-22 MED ORDER — CLOTRIMAZOLE-BETAMETHASONE 1-0.05 % EX CREA: TOPICAL_CREAM | Freq: Two times a day (BID) | CUTANEOUS | Status: DC

## 2012-08-22 MED ORDER — PREDNISONE 2.5 MG PO TABS: 2.5000 mg | ORAL_TABLET | Freq: Every day | ORAL | Status: DC

## 2012-08-22 NOTE — Patient Instructions (Signed)
Alternate the  2-1/2 one day with a 5 mg the next day until you run out of 5 mg tablets of prednisone

## 2012-08-22 NOTE — Progress Notes (Signed)
Subjective:    Patient ID: Dean Murphy, male    DOB: [birth year], 79 y.o.   MRN: [id number]  HPI Patient is an 79 year old man who is followed up for her diabetes polymyalgia rheumatica on prednisone and history of mild renal insufficiency.  We reviewed the blood work from his last visit showing that his sedimentation rate had dropped from 9 and his aches and pains have improved dramatically with the use of the prednisone for polymyalgia rheumatica.  We will perform appropriate medication including his Prilosec   Review of Systems  Constitutional: Positive for fatigue. Negative for fever.  HENT: Negative for hearing loss, congestion, neck pain and postnasal drip.   Eyes: Negative for discharge, redness and visual disturbance.  Respiratory: Negative for cough, shortness of breath and wheezing.   Cardiovascular: Negative for leg swelling.  Gastrointestinal: Negative for abdominal pain, constipation and abdominal distention.  Genitourinary: Positive for frequency. Negative for urgency.  Musculoskeletal: Positive for arthralgias. Negative for joint swelling.  Skin: Negative for color change and rash.  Neurological: Negative for weakness and light-headedness.  Hematological: Negative for adenopathy.  Psychiatric/Behavioral: Negative for behavioral problems.       Past Medical History  Diagnosis Date  . Diabetes mellitus   . GERD (gastroesophageal reflux disease)   . Hyperlipidemia   . Hypertension   . Arthritis   . Diverticulosis   . Hemorrhoids   . Colon polyps   . CAD (coronary artery disease)   . Chronic kidney disease     History   Social History  . Marital Status: Married    Spouse Name: N/A    Number of Children: N/A  . Years of Education: N/A   Occupational History  . retired    Social History Main Topics  . Smoking status: Former Smoker -- 0.50 packs/day for 10 years    Types: Cigarettes    Quit date: 01/22/1971  . Smokeless tobacco: Not on file   Comment: smoked in high school  . Alcohol Use: No  . Drug Use: No  . Sexually Active: Not on file   Other Topics Concern  . Not on file   Social History Narrative  . No narrative on file    Past Surgical History  Procedure Laterality Date  . Coronary artery bypass graft    . Cholecystectomy    . Total knee arthroplasty      bilateral  . Tonsillectomy    . Carotid endarterectomy  12/31/10    Right  . Carotid endarterectomy  02/09/11    left  . Joint replacement      bilat. knees    Family History  Problem Relation Age of Onset  . Diabetes    . Cancer    . Heart disease Mother   . Heart attack Father     No Known Allergies  Current Outpatient Prescriptions on File Prior to Visit  Medication Sig Dispense Refill  . amLODipine (NORVASC) 10 MG tablet Take 1 tablet (10 mg total) by mouth daily.  90 tablet  3  . aspirin 325 MG tablet Take 325 mg by mouth daily.        . benazepril (LOTENSIN) 40 MG tablet Take 0.5 tablets (20 mg total) by mouth daily.  90 tablet  3  . furosemide (LASIX) 20 MG tablet Take 1 tablet (20 mg total) by mouth daily.  90 tablet  3  . glipiZIDE (GLUCOTROL) 10 MG tablet Take 1 tablet (10 mg total) by mouth daily.  90  tablet  3  . lubiprostone (AMITIZA) 8 MCG capsule Take 1 capsule (8 mcg total) by mouth daily with breakfast.  90 capsule  3  . metoprolol succinate (TOPROL-XL) 25 MG 24 hr tablet Take 1 tablet (25 mg total) by mouth daily.  90 tablet  3  . NON FORMULARY daily. careconcepts lancets an d glucometer strips      . Saxagliptin-Metformin (KOMBIGLYZE XR) 2.09-998 MG TB24 Take 1 tablet by mouth daily.  90 tablet  3   No current facility-administered medications on file prior to visit.    BP 130/70  Pulse 76  Temp(Src) 98.6 F (37 C)  Resp 16  Ht 5\' 11"  (1.803 m)  Wt 210 lb (95.255 kg)  BMI 29.3 kg/m2    Objective:   Physical Exam  Constitutional: He appears well-developed and well-nourished.  HENT:  Head: Normocephalic and  atraumatic.  Eyes: Conjunctivae are normal. Pupils are equal, round, and reactive to light.  Neck: Normal range of motion. Neck supple.  Cardiovascular: Normal rate and regular rhythm.   Pulmonary/Chest: Effort normal and breath sounds normal.  Abdominal: Soft. Bowel sounds are normal.          Assessment & Plan:  Slowly titrate the prednisone down to 2-1/2 mg by mouth daily over the next 3 months.  Monitor her sedimentation rate renal function at that time.  Diabetes appears to be stable he on the low dose of prednisone.  Blood pressure stable followup visit 3 months

## 2012-08-31 ENCOUNTER — Other Ambulatory Visit: Payer: Medicare Other

## 2012-08-31 ENCOUNTER — Ambulatory Visit: Payer: Medicare Other | Admitting: Neurosurgery

## 2012-09-21 DIAGNOSIS — H409 Unspecified glaucoma: Secondary | ICD-10-CM | POA: Diagnosis not present

## 2012-09-21 DIAGNOSIS — E119 Type 2 diabetes mellitus without complications: Secondary | ICD-10-CM | POA: Diagnosis not present

## 2012-09-21 DIAGNOSIS — H4011X0 Primary open-angle glaucoma, stage unspecified: Secondary | ICD-10-CM | POA: Diagnosis not present

## 2012-09-21 DIAGNOSIS — Z961 Presence of intraocular lens: Secondary | ICD-10-CM | POA: Diagnosis not present

## 2012-09-29 ENCOUNTER — Ambulatory Visit: Payer: Medicare Other | Admitting: Vascular Surgery

## 2012-09-29 ENCOUNTER — Other Ambulatory Visit: Payer: Medicare Other

## 2012-10-05 ENCOUNTER — Encounter: Payer: Self-pay | Admitting: Vascular Surgery

## 2012-10-06 ENCOUNTER — Ambulatory Visit (INDEPENDENT_AMBULATORY_CARE_PROVIDER_SITE_OTHER): Payer: Medicare Other | Admitting: Vascular Surgery

## 2012-10-06 ENCOUNTER — Encounter: Payer: Self-pay | Admitting: Vascular Surgery

## 2012-10-06 ENCOUNTER — Other Ambulatory Visit (INDEPENDENT_AMBULATORY_CARE_PROVIDER_SITE_OTHER): Payer: Medicare Other | Admitting: *Deleted

## 2012-10-06 DIAGNOSIS — Z48812 Encounter for surgical aftercare following surgery on the circulatory system: Secondary | ICD-10-CM | POA: Diagnosis not present

## 2012-10-06 DIAGNOSIS — I739 Peripheral vascular disease, unspecified: Secondary | ICD-10-CM | POA: Insufficient documentation

## 2012-10-06 DIAGNOSIS — I6529 Occlusion and stenosis of unspecified carotid artery: Secondary | ICD-10-CM

## 2012-10-06 NOTE — Progress Notes (Signed)
[facility] HISTORY AND PHYSICAL   History of Present Illness:  Patient is a 79 y.o. year old male who presents for follow-up evaluation for carotid stenosis.  He is on Aspirin for antiplatelet therapy.  His atherosclerotic risk factors remain diabetes, elevated cholesterol, hypertension and coronary artery disease.  These are all currently stable and followed by his primary care physician.  He denies any new neurologic events including amaurosis, numbness, or weakness. He had bilateral carotid endarterectomy in 2012.  Past Medical History  Diagnosis Date  . Diabetes mellitus   . GERD (gastroesophageal reflux disease)   . Hyperlipidemia   . Hypertension   . Arthritis   . Diverticulosis   . Hemorrhoids   . Colon polyps   . CAD (coronary artery disease)   . Chronic kidney disease     Past Surgical History  Procedure Laterality Date  . Coronary artery bypass graft    . Cholecystectomy    . Total knee arthroplasty      bilateral  . Tonsillectomy    . Carotid endarterectomy  12/31/10    Right  . Carotid endarterectomy  02/09/11    left  . Joint replacement      bilat. knees    Review of Systems:  Neurologic: as above Cardiac:denies shortness of breath or chest pain Pulmonary: denies cough or wheeze  Social History History  Substance Use Topics  . Smoking status: Former Smoker -- 0.50 packs/day for 10 years    Types: Cigarettes    Quit date: 01/22/1971  . Smokeless tobacco: Not on file     Comment: smoked in high school  . Alcohol Use: No    Allergies  No Known Allergies   Current Outpatient Prescriptions  Medication Sig Dispense Refill  . amLODipine (NORVASC) 10 MG tablet Take 1 tablet (10 mg total) by mouth daily.  90 tablet  3  . aspirin 325 MG tablet Take 325 mg by mouth daily.        . benazepril (LOTENSIN) 40 MG tablet Take 0.5 tablets (20 mg total) by mouth daily.  90 tablet  3  . clotrimazole-betamethasone (LOTRISONE) cream  Apply topically 2 (two) times daily.  30 g  0  . glipiZIDE (GLUCOTROL) 10 MG tablet Take 1 tablet (10 mg total) by mouth daily.  90 tablet  3  . lubiprostone (AMITIZA) 8 MCG capsule Take 1 capsule (8 mcg total) by mouth daily with breakfast.  90 capsule  3  . metoprolol succinate (TOPROL-XL) 25 MG 24 hr tablet Take 1 tablet (25 mg total) by mouth daily.  90 tablet  3  . NON FORMULARY daily. careconcepts lancets an d glucometer strips      . omeprazole (PRILOSEC) 20 MG capsule Take 1 capsule (20 mg total) by mouth daily.  90 capsule  3  . predniSONE (DELTASONE) 2.5 MG tablet Take 1 tablet (2.5 mg total) by mouth daily.  30 tablet  3  . Saxagliptin-Metformin (KOMBIGLYZE XR) 2.09-998 MG TB24 Take 1 tablet by mouth daily.  90 tablet  3  . furosemide (LASIX) 20 MG tablet Take 1 tablet (20 mg total) by mouth daily.  90 tablet  3   No current facility-administered medications for this visit.    Physical Examination  Filed Vitals:   10/06/12 1458 10/06/12 1501  BP: 151/78 148/72  Pulse: 71 70  Resp: 16   Height: 5\' 10"  (1.778 m)   Weight: 210 lb (95.255 kg)   SpO2: 96%  Body mass index is 30.13 kg/(m^2).  General:  Alert and oriented, no acute distress HEENT: Normal Neck: No bruit or JVD Pulmonary: Clear to auscultation bilaterally Cardiac: Regular Rate and Rhythm without murmur Neurologic: Upper and lower extremity motor 5/5 and symmetric Extremities: 2+ left posterior tibial pulse one plus posterior tibial pulse right 2+ popliteal pulses bilaterally  DATA: He had a carotid duplex scan today which I reviewed and interpreted. This showed no significant stenosis in either carotid artery  ASSESSMENT: Doing well status post bilateral carotid endarterectomy   PLAN:  Continue aspirin and risk factor modification. He'll return in one year for followup carotid duplex exam  Fabienne Bruns, MD [facility] Office: [phone] Pager: [phone]

## 2012-10-07 NOTE — Addendum Note (Signed)
Addended by: Sharee Pimple on: 10/07/2012 09:06 AM   Modules accepted: Orders

## 2012-12-14 ENCOUNTER — Ambulatory Visit: Payer: Medicare Other | Admitting: Internal Medicine

## 2012-12-15 ENCOUNTER — Ambulatory Visit (INDEPENDENT_AMBULATORY_CARE_PROVIDER_SITE_OTHER): Payer: Medicare Other | Admitting: Internal Medicine

## 2012-12-15 ENCOUNTER — Telehealth: Payer: Self-pay | Admitting: *Deleted

## 2012-12-15 ENCOUNTER — Ambulatory Visit (INDEPENDENT_AMBULATORY_CARE_PROVIDER_SITE_OTHER)
Admission: RE | Admit: 2012-12-15 | Discharge: 2012-12-15 | Disposition: A | Discharge status: Home / Self Care | Payer: Medicare Other | Source: Ambulatory Visit | Attending: Internal Medicine | Admitting: Internal Medicine

## 2012-12-15 ENCOUNTER — Encounter: Payer: Self-pay | Admitting: Internal Medicine

## 2012-12-15 VITALS — BP 154/78 | HR 76 | Temp 98.3°F | Resp 16 | Ht 70.0 in | Wt 212.0 lb

## 2012-12-15 DIAGNOSIS — N481 Balanitis: Secondary | ICD-10-CM

## 2012-12-15 DIAGNOSIS — N476 Balanoposthitis: Secondary | ICD-10-CM | POA: Diagnosis not present

## 2012-12-15 DIAGNOSIS — I10 Essential (primary) hypertension: Secondary | ICD-10-CM

## 2012-12-15 DIAGNOSIS — M47817 Spondylosis without myelopathy or radiculopathy, lumbosacral region: Secondary | ICD-10-CM | POA: Diagnosis not present

## 2012-12-15 DIAGNOSIS — M5416 Radiculopathy, lumbar region: Secondary | ICD-10-CM | POA: Insufficient documentation

## 2012-12-15 DIAGNOSIS — E785 Hyperlipidemia, unspecified: Secondary | ICD-10-CM

## 2012-12-15 DIAGNOSIS — E1129 Type 2 diabetes mellitus with other diabetic kidney complication: Secondary | ICD-10-CM

## 2012-12-15 DIAGNOSIS — IMO0002 Reserved for concepts with insufficient information to code with codable children: Secondary | ICD-10-CM | POA: Diagnosis not present

## 2012-12-15 DIAGNOSIS — G8929 Other chronic pain: Secondary | ICD-10-CM

## 2012-12-15 LAB — BASIC METABOLIC PANEL
BUN: 31 mg/dL — ABNORMAL HIGH (ref 6–23)
Calcium: 10 mg/dL (ref 8.4–10.5)
Creatinine, Ser: 2.1 mg/dL — ABNORMAL HIGH (ref 0.4–1.5)
GFR: 32.7 mL/min — ABNORMAL LOW (ref >60.00)

## 2012-12-15 LAB — HEMOGLOBIN A1C: Hgb A1c MFr Bld: 7.2 % — ABNORMAL HIGH (ref 4.6–6.5)

## 2012-12-15 MED ORDER — CLOTRIMAZOLE-BETAMETHASONE 1-0.05 % EX CREA: TOPICAL_CREAM | Freq: Two times a day (BID) | CUTANEOUS | Status: DC

## 2012-12-15 MED ORDER — METHYLPREDNISOLONE (PAK) 4 MG PO TABS: 4.0000 mg | ORAL_TABLET | Freq: Every day | ORAL | Status: DC

## 2012-12-15 MED ORDER — FUROSEMIDE 40 MG PO TABS: 40.0000 mg | ORAL_TABLET | Freq: Every day | ORAL | Status: DC

## 2012-12-15 MED ORDER — CYCLOBENZAPRINE HCL 5 MG PO TABS: 5.0000 mg | ORAL_TABLET | Freq: Three times a day (TID) | ORAL | Status: DC | PRN

## 2012-12-15 NOTE — Patient Instructions (Addendum)
We will get an x-ray to make sure that there is not a compression fracture in the back or compressed vertebrae other possible causes of this kind of back pain include a slipped disc or partial ruptured disc take a muscle relaxant as prescribed and we are going to give you a burst of prednisone to see if we can shrink the inflammation and pain  We will increase the furosemide to 40 mg one a day for the increased swelling

## 2012-12-15 NOTE — Telephone Encounter (Signed)
Lab called with critical lab-k+ is 6.5 Per dr Lovell Sheehan stop potassium foods and redraw tomorrow- unable to contact patient, so called and Left message on machine What to do

## 2012-12-15 NOTE — Progress Notes (Signed)
Subjective:    Patient ID: Dean Murphy, male    DOB: [birth year], 79 y.o.   MRN: [id number]  HPI Patient presents today with acute low back pain radiating down the left side.  He does also having some neck pain and arthritis.  He recently had an appointment with his vascular surgeon is carotid disease has not changed.  Blood pressure was elevated but this is most probably secondary to pain   Review of Systems  Respiratory: Positive for shortness of breath.   Cardiovascular: Negative.   Gastrointestinal: Positive for abdominal pain and abdominal distention.  Neurological: Positive for weakness.   Past Medical History  Diagnosis Date  . Diabetes mellitus   . GERD (gastroesophageal reflux disease)   . Hyperlipidemia   . Hypertension   . Arthritis   . Diverticulosis   . Hemorrhoids   . Colon polyps   . CAD (coronary artery disease)   . Chronic kidney disease     History   Social History  . Marital Status: Married    Spouse Name: N/A    Number of Children: N/A  . Years of Education: N/A   Occupational History  . retired    Social History Main Topics  . Smoking status: Former Smoker -- 0.50 packs/day for 10 years    Types: Cigarettes    Quit date: 01/22/1971  . Smokeless tobacco: Not on file     Comment: smoked in high school  . Alcohol Use: No  . Drug Use: No  . Sexually Active: Not on file   Other Topics Concern  . Not on file   Social History Narrative  . No narrative on file    Past Surgical History  Procedure Laterality Date  . Coronary artery bypass graft    . Cholecystectomy    . Total knee arthroplasty      bilateral  . Tonsillectomy    . Carotid endarterectomy  12/31/10    Right  . Carotid endarterectomy  02/09/11    left  . Joint replacement      bilat. knees    Family History  Problem Relation Age of Onset  . Diabetes    . Cancer    . Heart disease Mother   . Heart attack Father     No Known Allergies  Current Outpatient  Prescriptions on File Prior to Visit  Medication Sig Dispense Refill  . amLODipine (NORVASC) 10 MG tablet Take 1 tablet (10 mg total) by mouth daily.  90 tablet  3  . aspirin 325 MG tablet Take 325 mg by mouth daily.        . benazepril (LOTENSIN) 40 MG tablet Take 0.5 tablets (20 mg total) by mouth daily.  90 tablet  3  . glipiZIDE (GLUCOTROL) 10 MG tablet Take 1 tablet (10 mg total) by mouth daily.  90 tablet  3  . lubiprostone (AMITIZA) 8 MCG capsule Take 1 capsule (8 mcg total) by mouth daily with breakfast.  90 capsule  3  . metoprolol succinate (TOPROL-XL) 25 MG 24 hr tablet Take 1 tablet (25 mg total) by mouth daily.  90 tablet  3  . NON FORMULARY daily. careconcepts lancets an d glucometer strips      . omeprazole (PRILOSEC) 20 MG capsule Take 1 capsule (20 mg total) by mouth daily.  90 capsule  3  . Saxagliptin-Metformin (KOMBIGLYZE XR) 2.09-998 MG TB24 Take 1 tablet by mouth daily.  90 tablet  3  . furosemide (LASIX) 20 MG  tablet Take 1 tablet (20 mg total) by mouth daily.  90 tablet  3   No current facility-administered medications on file prior to visit.    BP 154/78  Pulse 76  Temp(Src) 98.3 F (36.8 C)  Resp 16  Ht 5\' 10"  (1.778 m)  Wt 212 lb (96.163 kg)  BMI 30.42 kg/m2       Objective:   Physical Exam  Vitals reviewed. Constitutional: He appears well-developed and well-nourished.  HENT:  Head: Normocephalic and atraumatic.  Eyes: Conjunctivae are normal. Pupils are equal, round, and reactive to light.  Neck: Normal range of motion. Neck supple.  Cardiovascular: Normal rate and regular rhythm.   Murmur heard. Pulmonary/Chest: Effort normal and breath sounds normal.  Abdominal: Soft. Bowel sounds are normal.          Assessment & Plan:  Acute on chronic back pain obtain plain films to make sure that he does not have a compression fracture treat with a Medrol Dosepak and muscle relaxants and the patient has besides therapy scheduled.  Consideration for  epidural his pain persists

## 2012-12-16 ENCOUNTER — Other Ambulatory Visit: Payer: Self-pay | Admitting: *Deleted

## 2012-12-16 ENCOUNTER — Other Ambulatory Visit (INDEPENDENT_AMBULATORY_CARE_PROVIDER_SITE_OTHER): Payer: Medicare Other

## 2012-12-16 DIAGNOSIS — E875 Hyperkalemia: Secondary | ICD-10-CM

## 2012-12-16 LAB — BASIC METABOLIC PANEL
CO2: 28 mEq/L (ref 19–32)
Calcium: 9.9 mg/dL (ref 8.4–10.5)
Creatinine, Ser: 1.8 mg/dL — ABNORMAL HIGH (ref 0.4–1.5)
GFR: 39.18 mL/min — ABNORMAL LOW (ref >60.00)

## 2012-12-19 ENCOUNTER — Other Ambulatory Visit: Payer: Self-pay | Admitting: *Deleted

## 2012-12-19 DIAGNOSIS — M810 Age-related osteoporosis without current pathological fracture: Secondary | ICD-10-CM

## 2012-12-19 NOTE — Telephone Encounter (Signed)
Pt informed potassium is lower ,but not quite normal.informed to watch potassium intake.

## 2012-12-21 ENCOUNTER — Ambulatory Visit (INDEPENDENT_AMBULATORY_CARE_PROVIDER_SITE_OTHER)
Admission: RE | Admit: 2012-12-21 | Discharge: 2012-12-21 | Disposition: A | Discharge status: Home / Self Care | Payer: Medicare Other | Source: Ambulatory Visit | Attending: Internal Medicine | Admitting: Internal Medicine

## 2012-12-21 ENCOUNTER — Other Ambulatory Visit: Payer: Self-pay | Admitting: Internal Medicine

## 2012-12-21 DIAGNOSIS — M810 Age-related osteoporosis without current pathological fracture: Secondary | ICD-10-CM

## 2012-12-23 ENCOUNTER — Other Ambulatory Visit: Payer: Medicare Other

## 2013-01-02 ENCOUNTER — Telehealth: Payer: Self-pay | Admitting: Internal Medicine

## 2013-01-02 DIAGNOSIS — I10 Essential (primary) hypertension: Secondary | ICD-10-CM

## 2013-01-02 DIAGNOSIS — K3184 Gastroparesis: Secondary | ICD-10-CM

## 2013-01-02 MED ORDER — BENAZEPRIL HCL 40 MG PO TABS: 20.0000 mg | ORAL_TABLET | Freq: Every day | ORAL | Status: DC

## 2013-01-02 MED ORDER — LUBIPROSTONE 8 MCG PO CAPS: 8.0000 ug | ORAL_CAPSULE | Freq: Every day | ORAL | Status: DC

## 2013-01-02 NOTE — Telephone Encounter (Signed)
done

## 2013-01-02 NOTE — Telephone Encounter (Signed)
Pt request refill of  lubiprostone (AMITIZA) 8 MCG capsule 90 day  1/day benazepril (LOTENSIN) 40 MG tablet  90 day  1/2 pill  A day [facility]

## 2013-01-06 ENCOUNTER — Other Ambulatory Visit: Payer: Self-pay | Admitting: *Deleted

## 2013-01-06 DIAGNOSIS — M810 Age-related osteoporosis without current pathological fracture: Secondary | ICD-10-CM

## 2013-01-06 NOTE — Telephone Encounter (Signed)
Dr Lovell Sheehan read bone density and wants reclast therapy yearly-diagnosis is osteoporosis

## 2013-01-10 ENCOUNTER — Other Ambulatory Visit (INDEPENDENT_AMBULATORY_CARE_PROVIDER_SITE_OTHER): Payer: Medicare Other

## 2013-01-10 DIAGNOSIS — Z5181 Encounter for therapeutic drug level monitoring: Secondary | ICD-10-CM

## 2013-01-10 DIAGNOSIS — Z7983 Long term (current) use of bisphosphonates: Secondary | ICD-10-CM | POA: Diagnosis not present

## 2013-01-10 LAB — BASIC METABOLIC PANEL
Chloride: 108 mEq/L (ref 96–112)
Potassium: 4.2 mEq/L (ref 3.5–5.1)

## 2013-02-01 ENCOUNTER — Encounter (HOSPITAL_COMMUNITY): Payer: Self-pay

## 2013-02-01 ENCOUNTER — Ambulatory Visit (HOSPITAL_COMMUNITY)
Admission: RE | Admit: 2013-02-01 | Discharge: 2013-02-01 | Disposition: A | Discharge status: Home / Self Care | Payer: Medicare Other | Source: Ambulatory Visit | Attending: Internal Medicine | Admitting: Internal Medicine

## 2013-02-01 ENCOUNTER — Other Ambulatory Visit (HOSPITAL_COMMUNITY): Payer: Self-pay | Admitting: Internal Medicine

## 2013-02-01 DIAGNOSIS — M810 Age-related osteoporosis without current pathological fracture: Secondary | ICD-10-CM | POA: Diagnosis not present

## 2013-02-01 HISTORY — DX: Age-related osteoporosis without current pathological fracture: M81.0

## 2013-02-01 MED ORDER — ZOLEDRONIC ACID 5 MG/100ML IV SOLN
5.0000 mg | Freq: Once | INTRAVENOUS | Status: AC
  Administered 2013-02-01: 5 mg via INTRAVENOUS
  Filled 2013-02-01: qty 100

## 2013-02-01 MED ORDER — SODIUM CHLORIDE 0.9 % IV SOLN
Freq: Once | INTRAVENOUS | Status: AC
  Administered 2013-02-01: 12:00:00 via INTRAVENOUS

## 2013-02-23 ENCOUNTER — Other Ambulatory Visit: Payer: Self-pay | Admitting: Internal Medicine

## 2013-03-20 DIAGNOSIS — H409 Unspecified glaucoma: Secondary | ICD-10-CM | POA: Diagnosis not present

## 2013-03-20 DIAGNOSIS — H4011X0 Primary open-angle glaucoma, stage unspecified: Secondary | ICD-10-CM | POA: Diagnosis not present

## 2013-03-27 ENCOUNTER — Encounter: Payer: Self-pay | Admitting: Internal Medicine

## 2013-03-27 ENCOUNTER — Ambulatory Visit (INDEPENDENT_AMBULATORY_CARE_PROVIDER_SITE_OTHER): Payer: Medicare Other | Admitting: Internal Medicine

## 2013-03-27 VITALS — BP 126/60 | HR 72 | Temp 98.0°F | Resp 16 | Ht 65.0 in | Wt 216.0 lb

## 2013-03-27 DIAGNOSIS — E1149 Type 2 diabetes mellitus with other diabetic neurological complication: Secondary | ICD-10-CM

## 2013-03-27 DIAGNOSIS — E785 Hyperlipidemia, unspecified: Secondary | ICD-10-CM | POA: Diagnosis not present

## 2013-03-27 DIAGNOSIS — R609 Edema, unspecified: Secondary | ICD-10-CM

## 2013-03-27 DIAGNOSIS — N182 Chronic kidney disease, stage 2 (mild): Secondary | ICD-10-CM

## 2013-03-27 DIAGNOSIS — I10 Essential (primary) hypertension: Secondary | ICD-10-CM

## 2013-03-27 DIAGNOSIS — E1129 Type 2 diabetes mellitus with other diabetic kidney complication: Secondary | ICD-10-CM

## 2013-03-27 DIAGNOSIS — Z23 Encounter for immunization: Secondary | ICD-10-CM

## 2013-03-27 MED ORDER — METOPROLOL SUCCINATE ER 50 MG PO TB24: 50.0000 mg | ORAL_TABLET | Freq: Every day | ORAL | Status: DC

## 2013-03-27 MED ORDER — GLIPIZIDE 10 MG PO TABS: 5.0000 mg | ORAL_TABLET | Freq: Every day | ORAL | Status: DC

## 2013-03-27 MED ORDER — AMLODIPINE BESYLATE 5 MG PO TABS: 5.0000 mg | ORAL_TABLET | Freq: Every day | ORAL | Status: DC

## 2013-03-27 MED ORDER — AMITRIPTYLINE HCL 25 MG PO TABS: 25.0000 mg | ORAL_TABLET | Freq: Every day | ORAL | Status: DC

## 2013-03-27 NOTE — Patient Instructions (Signed)
cut amlodipine in half from 10 to 5  Increase the metoprolol extended release from 25 to50 mg a day  Change the kombiglyze to the 09/998  And a new medicine called Elavil 25 mg at bedtime for the stinging and tingling This will also help you sleep

## 2013-03-27 NOTE — Progress Notes (Signed)
  Subjective:    Patient ID: Dean Murphy, male    DOB: [birth year], 79 y.o.   MRN: [id number]  HPI 12 ASA 79 year old male who has adult-onset diabetes who recently noted that his blood sugars have slightly increased over the norm and his last A1c waselevated at 7.2 with a normally his A1c's have been in the 6 range.  He states his CBGs also are slightly elevated.  He has noticed some symptoms consistent with neuropathy of both upper and lower extremities. This is also noting increased swelling of the lower extremities Without shortness of breath or chest pain.    Review of Systems  Constitutional: Positive for fatigue.  HENT: Positive for postnasal drip and sinus pressure.   Respiratory: Positive for shortness of breath.   Gastrointestinal: Positive for abdominal distention. Negative for abdominal pain.  Musculoskeletal: Positive for arthralgias, neck pain and neck stiffness.  Allergic/Immunologic:       Neuropathy  Neurological: Positive for numbness.       Objective:   Physical Exam  Constitutional: He appears well-developed and well-nourished.  HENT:  Head: Normocephalic and atraumatic.  Cardiovascular: Normal rate and regular rhythm.   Murmur heard. Pulmonary/Chest: Effort normal and breath sounds normal.  Abdominal: Bowel sounds are normal.  Musculoskeletal: He exhibits edema.  Skin: Skin is warm and dry.          Assessment & Plan:  Increased fatigue Her symptom worsening diabetes 2 chronic renal insufficiency measure creatinine BUN Initiate therapy for neuropathy Elavil 25 mg by mouth each bedtime Monitor renal function is on Lotensin and Norvasc and Lasix 40 mg by mouth daily  Peripheral edema on Lasix for some for some worsening of renal function monitor renal function  Change kombiglyze to 09/998

## 2013-04-25 ENCOUNTER — Encounter: Payer: Self-pay | Admitting: Family Medicine

## 2013-04-25 ENCOUNTER — Ambulatory Visit (INDEPENDENT_AMBULATORY_CARE_PROVIDER_SITE_OTHER): Payer: Medicare Other | Admitting: Family Medicine

## 2013-04-25 VITALS — BP 122/60 | HR 83 | Temp 98.0°F | Wt 212.0 lb

## 2013-04-25 DIAGNOSIS — B029 Zoster without complications: Secondary | ICD-10-CM

## 2013-04-25 DIAGNOSIS — IMO0002 Reserved for concepts with insufficient information to code with codable children: Secondary | ICD-10-CM | POA: Diagnosis not present

## 2013-04-25 DIAGNOSIS — G8929 Other chronic pain: Secondary | ICD-10-CM

## 2013-04-25 MED ORDER — VALACYCLOVIR HCL 1 G PO TABS: 1000.0000 mg | ORAL_TABLET | Freq: Three times a day (TID) | ORAL | Status: DC

## 2013-04-25 MED ORDER — METHYLPREDNISOLONE (PAK) 4 MG PO TABS: 4.0000 mg | ORAL_TABLET | Freq: Every day | ORAL | Status: DC

## 2013-04-25 NOTE — Progress Notes (Signed)
Pre visit review using our clinic review tool, if applicable. No additional management support is needed unless otherwise documented below in the visit note. 

## 2013-04-25 NOTE — Progress Notes (Signed)
   Subjective:    Patient ID: Dean Murphy, male    DOB: May 30, 1926, 79 y.o.   MRN: [id number]  HPI Here for what he thinks is shingles. 3 days ago he developed a burning pain in the left flank and now he has a rash in this area. He had shingles on the face about 20 years ago.    Review of Systems  Constitutional: Negative.   Cardiovascular: Positive for chest pain.  Skin: Positive for rash.       Objective:   Physical Exam  Constitutional: He appears well-developed and well-nourished. No distress.  Cardiovascular: Normal rate, regular rhythm, normal heart sounds and intact distal pulses.   Pulmonary/Chest: Effort normal and breath sounds normal.  Skin:  Band of erythematous vesicles and papules around the left flank           Assessment & Plan:  Shingles. Treat with steroids and Valtrex.

## 2013-05-25 DIAGNOSIS — N184 Chronic kidney disease, stage 4 (severe): Secondary | ICD-10-CM

## 2013-05-25 HISTORY — DX: Chronic kidney disease, stage 4 (severe): N18.4

## 2013-05-29 ENCOUNTER — Encounter: Payer: Self-pay | Admitting: Podiatry

## 2013-05-29 ENCOUNTER — Ambulatory Visit (INDEPENDENT_AMBULATORY_CARE_PROVIDER_SITE_OTHER): Payer: Medicare Other

## 2013-05-29 ENCOUNTER — Ambulatory Visit (INDEPENDENT_AMBULATORY_CARE_PROVIDER_SITE_OTHER): Payer: Medicare Other | Admitting: Podiatry

## 2013-05-29 VITALS — BP 152/72 | HR 71 | Resp 16 | Ht 70.0 in | Wt 208.0 lb

## 2013-05-29 DIAGNOSIS — M79673 Pain in unspecified foot: Secondary | ICD-10-CM

## 2013-05-29 DIAGNOSIS — M79609 Pain in unspecified limb: Secondary | ICD-10-CM

## 2013-05-29 DIAGNOSIS — I798 Other disorders of arteries, arterioles and capillaries in diseases classified elsewhere: Secondary | ICD-10-CM | POA: Diagnosis not present

## 2013-05-29 DIAGNOSIS — M109 Gout, unspecified: Secondary | ICD-10-CM | POA: Diagnosis not present

## 2013-05-29 DIAGNOSIS — M7750 Other enthesopathy of unspecified foot: Secondary | ICD-10-CM

## 2013-05-29 MED ORDER — TRIAMCINOLONE ACETONIDE 10 MG/ML IJ SUSP
10.0000 mg | Freq: Once | INTRAMUSCULAR | Status: AC
  Administered 2013-05-29: 10 mg

## 2013-05-29 NOTE — Progress Notes (Signed)
   Subjective:    Patient ID: Dean Murphy, male    DOB: 1926-11-29, 80 y.o.   MRN: [id number]  HPI Comments: "My feet are hurting, the right is the worst one"  Patient states went to bed New Years Eve and woke up New Years Day with a swollen, red, painful foot right, lateral and dorsal.  Noticed the left one looked swollen as well, but not as painful. The pt has not tried any treatments at home. Extremely tender to bear weight and wear shoes.     Review of Systems  Cardiovascular: Positive for leg swelling.       Calf pain with walking  All other systems reviewed and are negative.       Objective:   Physical Exam        Assessment & Plan:

## 2013-05-30 NOTE — Progress Notes (Signed)
Subjective:     Patient ID: Dean Murphy, male   DOB: 1926-07-24, 80 y.o.   MRN: [id number]  Foot Pain   patient states that my feet are hurting me for approximately 5 days and I also have pain that occurs in my right groin my right and left calf muscles after activity and during the night he had also with exertion. Patient states the right foot has been worse than the left   Review of Systems  All other systems reviewed and are negative.       Objective:   Physical Exam  Nursing note and vitals reviewed. Constitutional: He is oriented to person, place, and time.  Musculoskeletal: Normal range of motion.  Neurological: He is oriented to person, place, and time.  Skin: Skin is dry.   vascular status diminished both PT and DP pulses of both feet with exquisite discomfort around the right lateral foot peroneal brevis insertion area and also mild to moderate discomfort around the left first MPJ. Range of motion adequate with no muscle strength or neurological loss     Assessment:     Possible gout versus inflammatory tendinitis arthritis right over left foot. Concerned about possibility for vascular disease with claudication symptoms noted    Plan:     H&P and x-rays reviewed. Careful injection right lateral foot administered 3 mg Kenalog 5 mg Xylocaine Marcaine mixture and I am referring for vascular evaluation at [facility] heart and vascular. Reappoint 2 weeks earlier if not improved

## 2013-06-02 ENCOUNTER — Ambulatory Visit (HOSPITAL_COMMUNITY)
Admission: RE | Admit: 2013-06-02 | Discharge: 2013-06-02 | Disposition: A | Discharge status: Home / Self Care | Payer: Medicare Other | Source: Ambulatory Visit | Attending: Cardiovascular Disease | Admitting: Cardiovascular Disease

## 2013-06-02 DIAGNOSIS — I70219 Atherosclerosis of native arteries of extremities with intermittent claudication, unspecified extremity: Secondary | ICD-10-CM | POA: Diagnosis not present

## 2013-06-02 DIAGNOSIS — I798 Other disorders of arteries, arterioles and capillaries in diseases classified elsewhere: Secondary | ICD-10-CM

## 2013-06-02 DIAGNOSIS — I739 Peripheral vascular disease, unspecified: Secondary | ICD-10-CM | POA: Diagnosis not present

## 2013-06-02 NOTE — Progress Notes (Signed)
Arterial Duplex Lower Ext. Completed. Aron Needles, BS, RDMS, RVT  

## 2013-06-08 ENCOUNTER — Other Ambulatory Visit: Payer: Self-pay | Admitting: *Deleted

## 2013-06-08 DIAGNOSIS — I739 Peripheral vascular disease, unspecified: Secondary | ICD-10-CM

## 2013-06-12 ENCOUNTER — Encounter: Payer: Self-pay | Admitting: Podiatry

## 2013-06-12 ENCOUNTER — Ambulatory Visit (INDEPENDENT_AMBULATORY_CARE_PROVIDER_SITE_OTHER): Payer: Medicare Other | Admitting: Podiatry

## 2013-06-12 VITALS — BP 143/69 | HR 70 | Resp 16

## 2013-06-12 DIAGNOSIS — M7750 Other enthesopathy of unspecified foot: Secondary | ICD-10-CM | POA: Diagnosis not present

## 2013-06-12 NOTE — Progress Notes (Signed)
Subjective:     Patient ID: Dean Murphy, male   DOB: 03/18/1927, 80 y.o.   MRN: [id number]  HPI patient presents stating the pain in my right foot is much better and not giving as many leg cramps   Review of Systems     Objective:   Physical Exam Neurovascular status unchanged from previous visit with some indications on Dr. Naida Sleight report of reduced   blood flow into the toes along with inflammation lateral right foot that is much improved Assessment:     Tendinitis right and possible gout attack it may have occurred with moderate distal pVD    Plan:     H&P performed and advised on foods to avoid with gout and advised that no treatment for his vascular system and less other changes should occur

## 2013-06-13 ENCOUNTER — Telehealth (HOSPITAL_COMMUNITY): Payer: Self-pay | Admitting: *Deleted

## 2013-06-27 ENCOUNTER — Ambulatory Visit: Payer: Medicare Other | Admitting: Cardiovascular Disease

## 2013-07-17 ENCOUNTER — Other Ambulatory Visit: Payer: Self-pay | Admitting: Vascular Surgery

## 2013-07-17 DIAGNOSIS — I6529 Occlusion and stenosis of unspecified carotid artery: Secondary | ICD-10-CM

## 2013-07-17 DIAGNOSIS — Z48812 Encounter for surgical aftercare following surgery on the circulatory system: Secondary | ICD-10-CM

## 2013-07-19 ENCOUNTER — Telehealth: Payer: Self-pay | Admitting: *Deleted

## 2013-07-19 MED ORDER — ALLOPURINOL 100 MG PO TABS: 100.0000 mg | ORAL_TABLET | Freq: Every day | ORAL | Status: DC

## 2013-07-19 NOTE — Telephone Encounter (Addendum)
Pt states he is having a gout flare-up, would like a prescription.  Dr Paulla Dolly ordered Allopurinol 100mg  #30 one tablet po qd.  Orders sent electronically and called to pt.

## 2013-08-09 ENCOUNTER — Ambulatory Visit (INDEPENDENT_AMBULATORY_CARE_PROVIDER_SITE_OTHER): Payer: Medicare Other | Admitting: Internal Medicine

## 2013-08-09 ENCOUNTER — Encounter: Payer: Self-pay | Admitting: Internal Medicine

## 2013-08-09 ENCOUNTER — Telehealth: Payer: Self-pay | Admitting: Internal Medicine

## 2013-08-09 VITALS — BP 140/70 | HR 67 | Temp 98.0°F | Wt 214.0 lb

## 2013-08-09 DIAGNOSIS — M109 Gout, unspecified: Secondary | ICD-10-CM | POA: Insufficient documentation

## 2013-08-09 DIAGNOSIS — E1149 Type 2 diabetes mellitus with other diabetic neurological complication: Secondary | ICD-10-CM | POA: Diagnosis not present

## 2013-08-09 DIAGNOSIS — E1129 Type 2 diabetes mellitus with other diabetic kidney complication: Secondary | ICD-10-CM | POA: Diagnosis not present

## 2013-08-09 DIAGNOSIS — I10 Essential (primary) hypertension: Secondary | ICD-10-CM

## 2013-08-09 DIAGNOSIS — I6529 Occlusion and stenosis of unspecified carotid artery: Secondary | ICD-10-CM | POA: Diagnosis not present

## 2013-08-09 MED ORDER — FEBUXOSTAT 40 MG PO TABS: 40.0000 mg | ORAL_TABLET | Freq: Every day | ORAL | Status: DC

## 2013-08-09 MED ORDER — COLCHICINE 0.6 MG PO TABS: 0.6000 mg | ORAL_TABLET | Freq: Two times a day (BID) | ORAL | Status: DC

## 2013-08-09 MED ORDER — GLIPIZIDE 10 MG PO TABS: 10.0000 mg | ORAL_TABLET | Freq: Every day | ORAL | Status: DC

## 2013-08-09 MED ORDER — CANAGLIFLOZIN-METFORMIN HCL 150-500 MG PO TABS: 1.0000 | ORAL_TABLET | Freq: Two times a day (BID) | ORAL | Status: DC

## 2013-08-09 NOTE — Progress Notes (Signed)
Subjective:    Patient ID: Dean Murphy, male    DOB: 08-Nov-1926, 80 y.o.   MRN: [id number]  HPI Patient is an 80 year old male who is followed for multiple medical problems including hyperlipidemia hypertension osteoarthritis diabetes with peripheral neuropathy and mild renal insufficiency.  He presents after a episode of shingles with resolution of the shingles but has had polyarticular gout attacks over the past several months in his great toes and also noted in his fingers allupurinol in acute gout may extend the inflamation  Dicussed change to uloric   Review of Systems  Constitutional: Negative for fever and fatigue.  HENT: Negative for congestion, hearing loss and postnasal drip.   Eyes: Negative for discharge, redness and visual disturbance.  Respiratory: Negative for cough, shortness of breath and wheezing.   Cardiovascular: Negative for leg swelling.  Gastrointestinal: Negative for abdominal pain, constipation and abdominal distention.  Genitourinary: Negative for urgency and frequency.  Musculoskeletal: Positive for arthralgias and joint swelling. Negative for neck pain.       : Base of the great toes bilaterally  Skin: Negative for color change and rash.  Neurological: Negative for weakness and light-headedness.  Hematological: Negative for adenopathy.  Psychiatric/Behavioral: Negative for behavioral problems.   Past Medical History  Diagnosis Date  . Diabetes mellitus   . GERD (gastroesophageal reflux disease)   . Hyperlipidemia   . Hypertension   . Arthritis   . Diverticulosis   . Hemorrhoids   . Colon polyps   . CAD (coronary artery disease)   . Chronic kidney disease   . Osteoporosis     History   Social History  . Marital Status: Married    Spouse Name: N/A    Number of Children: N/A  . Years of Education: N/A   Occupational History  . retired    Social History Main Topics  . Smoking status: Former Smoker -- 0.50 packs/day for 10 years   Types: Cigarettes    Quit date: 01/22/1971  . Smokeless tobacco: Never Used     Comment: smoked in high school  . Alcohol Use: No  . Drug Use: No  . Sexual Activity: Not on file   Other Topics Concern  . Not on file   Social History Narrative  . No narrative on file    Past Surgical History  Procedure Laterality Date  . Coronary artery bypass graft    . Cholecystectomy    . Total knee arthroplasty      bilateral  . Tonsillectomy    . Carotid endarterectomy  12/31/10    Right  . Carotid endarterectomy  02/09/11    left  . Joint replacement      bilat. knees    Family History  Problem Relation Age of Onset  . Diabetes    . Cancer    . Heart disease Mother   . Heart attack Father     No Known Allergies  Current Outpatient Prescriptions on File Prior to Visit  Medication Sig Dispense Refill  . amitriptyline (ELAVIL) 25 MG tablet Take 1 tablet (25 mg total) by mouth at bedtime.  30 tablet  5  . amLODipine (NORVASC) 5 MG tablet Take 1 tablet (5 mg total) by mouth daily.  90 tablet  3  . aspirin 325 MG tablet Take 325 mg by mouth daily.        . benazepril (LOTENSIN) 40 MG tablet Take 0.5 tablets (20 mg total) by mouth daily.  45 tablet  3  .  clotrimazole-betamethasone (LOTRISONE) cream Apply topically 2 (two) times daily.  30 g  0  . cyclobenzaprine (FLEXERIL) 5 MG tablet Take 1 tablet (5 mg total) by mouth 3 (three) times daily as needed for muscle spasms.  30 tablet  1  . furosemide (LASIX) 40 MG tablet Take 1 tablet (40 mg total) by mouth daily.  90 tablet  3  . glipiZIDE (GLUCOTROL) 10 MG tablet Take 0.5 tablets (5 mg total) by mouth daily.  90 tablet  3  . lubiprostone (AMITIZA) 8 MCG capsule Take 1 capsule (8 mcg total) by mouth daily with breakfast.  90 capsule  3  . methylPREDNIsolone (MEDROL DOSPACK) 4 MG tablet Take 1 tablet (4 mg total) by mouth daily. follow package directions  21 tablet  0  . metoprolol succinate (TOPROL-XL) 50 MG 24 hr tablet Take 1 tablet (50  mg total) by mouth daily.  90 tablet  3  . NON FORMULARY daily. careconcepts lancets an d glucometer strips      . omeprazole (PRILOSEC) 20 MG capsule Take 1 capsule (20 mg total) by mouth daily.  90 capsule  3  . Saxagliptin-Metformin (KOMBIGLYZE XR) 2.09-998 MG TB24 Take 1 tablet by mouth daily.  90 tablet  3  . valACYclovir (VALTREX) 1000 MG tablet Take 1 tablet (1,000 mg total) by mouth 3 (three) times daily.  30 tablet  0  . zoledronic acid (RECLAST) 5 MG/100ML SOLN injection Inject 5 mg into the vein once.       No current facility-administered medications on file prior to visit.    BP 140/70  Pulse 67  Temp(Src) 98 F (36.7 C)  Wt 214 lb (97.07 kg)  SpO2 97%       Objective:   Physical Exam  Nursing note and vitals reviewed. Constitutional: He appears well-nourished.  HENT:  Head: Atraumatic.  Eyes: Conjunctivae and EOM are normal.  Neck: Neck supple. No tracheal deviation present. No thyromegaly present.  Cardiovascular: Normal rate and regular rhythm.   Murmur heard. Abdominal: Soft. Bowel sounds are normal.  Musculoskeletal: He exhibits edema and tenderness.  Erythema at base of great toe  Skin: There is erythema.          Assessment & Plan:  Gout in the left foot in the great toe Dr Paulla Dolly sent to cardiologist for vein exam and arterial study and this was reported to be normal [facility]   Patient will use colchicine for the next several days to end of a gouty attack and then take uloricdaily to prevent gout

## 2013-08-09 NOTE — Progress Notes (Signed)
Pre visit review using our clinic review tool, if applicable. No additional management support is needed unless otherwise documented below in the visit note. 

## 2013-08-09 NOTE — Telephone Encounter (Signed)
Relevant patient education mailed to patient.  

## 2013-08-09 NOTE — Patient Instructions (Addendum)
Take the colchicine twice a day until the gout attacks stop take the uloric every day   Resume a whole glucotrol Start the invokamet 150 twice a day

## 2013-08-17 ENCOUNTER — Telehealth: Payer: Self-pay

## 2013-08-17 ENCOUNTER — Other Ambulatory Visit: Payer: Self-pay | Admitting: Dermatology

## 2013-08-17 DIAGNOSIS — D485 Neoplasm of uncertain behavior of skin: Secondary | ICD-10-CM | POA: Diagnosis not present

## 2013-08-17 DIAGNOSIS — L570 Actinic keratosis: Secondary | ICD-10-CM | POA: Diagnosis not present

## 2013-08-17 DIAGNOSIS — L821 Other seborrheic keratosis: Secondary | ICD-10-CM | POA: Diagnosis not present

## 2013-08-17 DIAGNOSIS — L820 Inflamed seborrheic keratosis: Secondary | ICD-10-CM | POA: Diagnosis not present

## 2013-08-17 NOTE — Telephone Encounter (Signed)
Relevant patient education mailed to patient.  

## 2013-08-23 ENCOUNTER — Telehealth: Payer: Self-pay | Admitting: Internal Medicine

## 2013-08-23 DIAGNOSIS — I10 Essential (primary) hypertension: Secondary | ICD-10-CM

## 2013-08-23 MED ORDER — METOPROLOL SUCCINATE ER 50 MG PO TB24: 50.0000 mg | ORAL_TABLET | Freq: Every day | ORAL | Status: DC

## 2013-08-23 NOTE — Telephone Encounter (Signed)
[facility] is requesting re-fill on  metoprolol succinate (TOPROL-XL) 50 MG 24 hr tablet

## 2013-09-25 DIAGNOSIS — H4011X0 Primary open-angle glaucoma, stage unspecified: Secondary | ICD-10-CM | POA: Diagnosis not present

## 2013-09-25 DIAGNOSIS — H349 Unspecified retinal vascular occlusion: Secondary | ICD-10-CM | POA: Diagnosis not present

## 2013-09-25 DIAGNOSIS — H52209 Unspecified astigmatism, unspecified eye: Secondary | ICD-10-CM | POA: Diagnosis not present

## 2013-09-25 DIAGNOSIS — H409 Unspecified glaucoma: Secondary | ICD-10-CM | POA: Diagnosis not present

## 2013-09-26 DIAGNOSIS — H43819 Vitreous degeneration, unspecified eye: Secondary | ICD-10-CM | POA: Diagnosis not present

## 2013-09-26 DIAGNOSIS — H34239 Retinal artery branch occlusion, unspecified eye: Secondary | ICD-10-CM | POA: Diagnosis not present

## 2013-10-11 ENCOUNTER — Encounter: Payer: Self-pay | Admitting: Family

## 2013-10-12 ENCOUNTER — Ambulatory Visit (HOSPITAL_COMMUNITY)
Admission: RE | Admit: 2013-10-12 | Discharge: 2013-10-12 | Disposition: A | Discharge status: Home / Self Care | Payer: Medicare Other | Source: Ambulatory Visit | Attending: Family | Admitting: Family

## 2013-10-12 ENCOUNTER — Ambulatory Visit: Payer: Medicare Other | Admitting: Family

## 2013-10-12 DIAGNOSIS — I6529 Occlusion and stenosis of unspecified carotid artery: Secondary | ICD-10-CM | POA: Insufficient documentation

## 2013-10-12 DIAGNOSIS — Z48812 Encounter for surgical aftercare following surgery on the circulatory system: Secondary | ICD-10-CM | POA: Diagnosis not present

## 2013-10-12 NOTE — Patient Instructions (Signed)
Dear [name],  Your recent Vascular Lab study Oct 12, 2013 indicates; No significant change in comparison to the last exam  Oct 06, 2012. Please follow up in one year.        Stroke Prevention Some medical conditions and behaviors are associated with an increased chance of having a stroke. You may prevent a stroke by making healthy choices and managing medical conditions. HOW CAN I REDUCE MY RISK OF HAVING A STROKE?   Stay physically active. Get at least 30 minutes of activity on most or all days.  Do not smoke. It may also be helpful to avoid exposure to secondhand smoke.  Limit alcohol use. Moderate alcohol use is considered to be:  No more than 2 drinks per day for men.  No more than 1 drink per day for nonpregnant women.  Eat healthy foods. This involves  Eating 5 or more servings of fruits and vegetables a day.  Following a diet that addresses high blood pressure (hypertension), high cholesterol, diabetes, or obesity.  Manage your cholesterol levels.  A diet low in saturated fat, trans fat, and cholesterol and high in fiber may control cholesterol levels.  Take any prescribed medicines to control cholesterol as directed by your health care provider.  Manage your diabetes.  A controlled-carbohydrate, controlled-sugar diet is recommended to manage diabetes.  Take any prescribed medicines to control diabetes as directed by your health care provider.  Control your hypertension.  A low-salt (sodium), low-saturated fat, low-trans fat, and low-cholesterol diet is recommended to manage hypertension.  Take any prescribed medicines to control hypertension as directed by your health care provider.  Maintain a healthy weight.  A reduced-calorie, low-sodium, low-saturated fat, low-trans fat, low-cholesterol diet is recommended to manage weight.  Stop drug abuse.  Avoid taking birth control pills.  Talk to your health care provider about the risks of taking birth  control pills if you are over 70 years old, smoke, get migraines, or have ever had a blood clot.  Get evaluated for sleep disorders (sleep apnea).  Talk to your health care provider about getting a sleep evaluation if you snore a lot or have excessive sleepiness.  Take medicines as directed by your health care provider.  For some people, aspirin or blood thinners (anticoagulants) are helpful in reducing the risk of forming abnormal blood clots that can lead to stroke. If you have the irregular heart rhythm of atrial fibrillation, you should be on a blood thinner unless there is a good reason you cannot take them.  Understand all your medicine instructions.  Make sure that other other conditions (such as anemia or atherosclerosis) are addressed. SEEK IMMEDIATE MEDICAL CARE IF:   You have sudden weakness or numbness of the face, arm, or leg, especially on one side of the body.  Your face or eyelid droops to one side.  You have sudden confusion.  You have trouble speaking (aphasia) or understanding.  You have sudden trouble seeing in one or both eyes.  You have sudden trouble walking.  You have dizziness.  You have a loss of balance or coordination.  You have a sudden, severe headache with no known cause.  You have new chest pain or an irregular heartbeat. Any of these symptoms may represent a serious problem that is an emergency. Do not wait to see if the symptoms will go away. Get medical help at once. Call your local emergency services  (911 in [place] Do not drive yourself to the hospital. Document Released: 06/18/2004  Document Revised: 03/01/2013 Document Reviewed: 11/11/2012 [facility] Patient Information 2014 [place].

## 2013-10-13 ENCOUNTER — Encounter: Payer: Self-pay | Admitting: Vascular Surgery

## 2013-10-13 NOTE — Progress Notes (Signed)
Lab only 

## 2013-10-24 DIAGNOSIS — H349 Unspecified retinal vascular occlusion: Secondary | ICD-10-CM | POA: Diagnosis not present

## 2013-12-01 ENCOUNTER — Encounter: Payer: Self-pay | Admitting: Family Medicine

## 2013-12-01 ENCOUNTER — Ambulatory Visit (INDEPENDENT_AMBULATORY_CARE_PROVIDER_SITE_OTHER): Payer: Medicare Other | Admitting: Family Medicine

## 2013-12-01 VITALS — BP 126/60 | HR 65 | Temp 97.4°F | Ht 70.0 in | Wt 203.2 lb

## 2013-12-01 DIAGNOSIS — I10 Essential (primary) hypertension: Secondary | ICD-10-CM | POA: Diagnosis not present

## 2013-12-01 DIAGNOSIS — N184 Chronic kidney disease, stage 4 (severe): Secondary | ICD-10-CM

## 2013-12-01 DIAGNOSIS — E119 Type 2 diabetes mellitus without complications: Secondary | ICD-10-CM | POA: Diagnosis not present

## 2013-12-01 DIAGNOSIS — M109 Gout, unspecified: Secondary | ICD-10-CM

## 2013-12-01 DIAGNOSIS — I6529 Occlusion and stenosis of unspecified carotid artery: Secondary | ICD-10-CM | POA: Diagnosis not present

## 2013-12-01 DIAGNOSIS — K219 Gastro-esophageal reflux disease without esophagitis: Secondary | ICD-10-CM

## 2013-12-01 DIAGNOSIS — M1000 Idiopathic gout, unspecified site: Secondary | ICD-10-CM

## 2013-12-01 DIAGNOSIS — E1129 Type 2 diabetes mellitus with other diabetic kidney complication: Secondary | ICD-10-CM

## 2013-12-01 LAB — COMPREHENSIVE METABOLIC PANEL
ALK PHOS: 71 U/L (ref 39–117)
ALT: 59 U/L — ABNORMAL HIGH (ref 0–53)
AST: 50 U/L — AB (ref 0–37)
Albumin: 4.1 g/dL (ref 3.5–5.2)
BILIRUBIN TOTAL: 0.7 mg/dL (ref 0.2–1.2)
BUN: 38 mg/dL — ABNORMAL HIGH (ref 6–23)
CO2: 24 mEq/L (ref 19–32)
Calcium: 9.7 mg/dL (ref 8.4–10.5)
Chloride: 105 mEq/L (ref 96–112)
Creatinine, Ser: 3.1 mg/dL — ABNORMAL HIGH (ref 0.4–1.5)
GFR: 20.86 mL/min — ABNORMAL LOW (ref >60.00)
GLUCOSE: 78 mg/dL (ref 70–99)
Potassium: 5.8 mEq/L — ABNORMAL HIGH (ref 3.5–5.1)
SODIUM: 140 meq/L (ref 135–145)
Total Protein: 7.3 g/dL (ref 6.0–8.3)

## 2013-12-01 LAB — LDL CHOLESTEROL, DIRECT: LDL DIRECT: 203.2 mg/dL

## 2013-12-01 LAB — HEMOGLOBIN A1C: Hgb A1c MFr Bld: 7.1 % — ABNORMAL HIGH (ref 4.6–6.5)

## 2013-12-01 LAB — URIC ACID: URIC ACID, SERUM: 6.1 mg/dL (ref 4.0–7.8)

## 2013-12-01 MED ORDER — COLCHICINE 0.6 MG PO TABS: 0.6000 mg | ORAL_TABLET | Freq: Every day | ORAL | Status: DC | PRN

## 2013-12-01 MED ORDER — BENAZEPRIL HCL 20 MG PO TABS: 20.0000 mg | ORAL_TABLET | Freq: Every day | ORAL | Status: DC

## 2013-12-01 MED ORDER — OMEPRAZOLE 20 MG PO CPDR: 20.0000 mg | DELAYED_RELEASE_CAPSULE | Freq: Every day | ORAL | Status: DC

## 2013-12-01 NOTE — Patient Instructions (Signed)
Note the dose change on the benazepril- continue to take 20mg  a day but you won't have to break it in half.  Restart the colchicine for now, let me know if that doesn't help your foot pain. When better, you can stop it.  Go to the lab on the way out.  We'll contact you with your lab report. Make sure you are not longer taking amitriptyline for sleep.  If it off your med list.  We'll set a follow up appointment when I see your labs.  I would get a flu shot each fall.    Take care.  Glad to see you.

## 2013-12-01 NOTE — Progress Notes (Signed)
Pre visit review using our clinic review tool, if applicable. No additional management support is needed unless otherwise documented below in the visit note.  Diabetes:  Using medications without difficulties:yes Hypoglycemic episodes:no Hyperglycemic episodes:no Feet problems:no Blood Sugars averaging: 125-145 usually, up to 185 recently.    CKD d/w pt.  H/o Cr ~1.8- 2.  He wasn't aware of prev renal considerations. See notes on labs.    Hypertension:    Using medication without problems or lightheadedness: yes Chest pain with exertion:no Edema:occ BLE edema Short of breath:no  L foot pain, at the lateral midfoot, also with pain at the MTP recently.  Similar to prev gout flare, sore for the sheet to touch his foot.  Had taken colchicine prev.    PMH and SH reviewed.   Vital signs, Meds and allergies reviewed.  ROS: See HPI.  Otherwise nontributory.   GEN: nad, alert and oriented HEENT: mucous membranes moist NECK: supple w/o LA CV: rrr.  PULM: ctab, no inc wob ABD: soft, +bs EXT: no edema SKIN: no acute rash L lateral midfoot and L 1st MTP ttp

## 2013-12-04 ENCOUNTER — Other Ambulatory Visit: Payer: Self-pay | Admitting: Family Medicine

## 2013-12-04 ENCOUNTER — Encounter: Payer: Self-pay | Admitting: Family Medicine

## 2013-12-04 DIAGNOSIS — N184 Chronic kidney disease, stage 4 (severe): Secondary | ICD-10-CM

## 2013-12-04 MED ORDER — SODIUM POLYSTYRENE SULFONATE 15 GM/60ML PO SUSP: 30.0000 g | Freq: Once | ORAL | Status: DC

## 2013-12-04 NOTE — Assessment & Plan Note (Signed)
Worsened, see notes on labs.  Mult med changes to be made- reclast, metformin, etc.  Return for f/u labs.

## 2013-12-04 NOTE — Assessment & Plan Note (Signed)
See notes on labs. 

## 2013-12-04 NOTE — Assessment & Plan Note (Signed)
Okay to use colchicine for now, see notes on labs.

## 2013-12-04 NOTE — Assessment & Plan Note (Signed)
Controlled for now, see notes on labs.

## 2013-12-06 ENCOUNTER — Ambulatory Visit: Payer: Medicare Other | Admitting: Internal Medicine

## 2013-12-07 ENCOUNTER — Other Ambulatory Visit: Payer: Self-pay | Admitting: Family Medicine

## 2013-12-07 ENCOUNTER — Telehealth: Payer: Self-pay | Admitting: Radiology

## 2013-12-07 ENCOUNTER — Other Ambulatory Visit (INDEPENDENT_AMBULATORY_CARE_PROVIDER_SITE_OTHER): Payer: Medicare Other

## 2013-12-07 ENCOUNTER — Other Ambulatory Visit: Payer: Self-pay | Admitting: *Deleted

## 2013-12-07 DIAGNOSIS — N184 Chronic kidney disease, stage 4 (severe): Secondary | ICD-10-CM | POA: Diagnosis not present

## 2013-12-07 DIAGNOSIS — E875 Hyperkalemia: Secondary | ICD-10-CM

## 2013-12-07 LAB — COMPREHENSIVE METABOLIC PANEL
ALT: 47 U/L (ref 0–53)
AST: 41 U/L — AB (ref 0–37)
Albumin: 3.7 g/dL (ref 3.5–5.2)
Alkaline Phosphatase: 63 U/L (ref 39–117)
BUN: 36 mg/dL — AB (ref 6–23)
CALCIUM: 9.3 mg/dL (ref 8.4–10.5)
CHLORIDE: 109 meq/L (ref 96–112)
CO2: 26 mEq/L (ref 19–32)
Creatinine, Ser: 2.4 mg/dL — ABNORMAL HIGH (ref 0.4–1.5)
GFR: 27.11 mL/min — ABNORMAL LOW (ref >60.00)
Glucose, Bld: 145 mg/dL — ABNORMAL HIGH (ref 70–99)
Potassium: 6.2 mEq/L (ref 3.5–5.1)
Sodium: 141 mEq/L (ref 135–145)
Total Bilirubin: 0.9 mg/dL (ref 0.2–1.2)
Total Protein: 6.5 g/dL (ref 6.0–8.3)

## 2013-12-07 MED ORDER — SODIUM POLYSTYRENE SULFONATE 15 GM/60ML PO SUSP: 30.0000 g | Freq: Two times a day (BID) | ORAL | Status: DC

## 2013-12-07 NOTE — Telephone Encounter (Signed)
[facility] called a critical K+ - 6.2, results given to Dr Damita Dunnings

## 2013-12-07 NOTE — Telephone Encounter (Signed)
See phone note

## 2013-12-08 ENCOUNTER — Other Ambulatory Visit (INDEPENDENT_AMBULATORY_CARE_PROVIDER_SITE_OTHER): Payer: Medicare Other

## 2013-12-08 DIAGNOSIS — E875 Hyperkalemia: Secondary | ICD-10-CM | POA: Diagnosis not present

## 2013-12-08 LAB — BASIC METABOLIC PANEL
BUN: 29 mg/dL — AB (ref 6–23)
CO2: 28 mEq/L (ref 19–32)
CREATININE: 2.1 mg/dL — AB (ref 0.4–1.5)
Calcium: 9.3 mg/dL (ref 8.4–10.5)
Chloride: 108 mEq/L (ref 96–112)
GFR: 32.62 mL/min — AB (ref >60.00)
Glucose, Bld: 150 mg/dL — ABNORMAL HIGH (ref 70–99)
Potassium: 5.4 mEq/L — ABNORMAL HIGH (ref 3.5–5.1)
Sodium: 141 mEq/L (ref 135–145)

## 2013-12-11 ENCOUNTER — Encounter: Payer: Self-pay | Admitting: Family Medicine

## 2013-12-11 ENCOUNTER — Ambulatory Visit (INDEPENDENT_AMBULATORY_CARE_PROVIDER_SITE_OTHER): Payer: Medicare Other | Admitting: Family Medicine

## 2013-12-11 VITALS — BP 128/60 | HR 83 | Temp 97.6°F | Wt 204.0 lb

## 2013-12-11 DIAGNOSIS — I6529 Occlusion and stenosis of unspecified carotid artery: Secondary | ICD-10-CM

## 2013-12-11 DIAGNOSIS — N183 Chronic kidney disease, stage 3 unspecified: Secondary | ICD-10-CM

## 2013-12-11 DIAGNOSIS — R7989 Other specified abnormal findings of blood chemistry: Secondary | ICD-10-CM

## 2013-12-11 DIAGNOSIS — R799 Abnormal finding of blood chemistry, unspecified: Secondary | ICD-10-CM | POA: Diagnosis not present

## 2013-12-11 NOTE — Progress Notes (Signed)
Pre visit review using our clinic review tool, if applicable. No additional management support is needed unless otherwise documented below in the visit note.  To recap- Cr was up, K elevated. Mild LFT elevation. Mult meds held, but he didn't initially get kayexalate filled. When he did, the K came down some.  Cr some better. Off mult meds still, ACE decreased.  All d/w pt today.  Still off metformin combo, but sugars have been okay, 125 this AM.  He has no edema now and feels well.  All labs and recent events d/w pt.    PMH and SH reviewed  ROS: See HPI, otherwise noncontributory.  Meds, vitals, and allergies reviewed.   GEN: nad, alert and oriented HEENT: mucous membranes moist NECK: supple w/o LA CV: rrr.  PULM: ctab, no inc wob ABD: soft, +bs EXT: no edema SKIN: no acute rash

## 2013-12-11 NOTE — Patient Instructions (Signed)
Go to the lab on the way out.  We'll contact you with your lab report.  Use the med list from today.   Take the furosemide only if you have a lot of swelling.  We'll likely need to adjust your diabetes meds in the future but not today.  If you sugar starts to go up on home checks, consistently >150, then notify us.  If the swelling comes back frequently, then notify us.  Take care.

## 2013-12-12 ENCOUNTER — Encounter: Payer: Self-pay | Admitting: Family Medicine

## 2013-12-12 LAB — COMPREHENSIVE METABOLIC PANEL
ALK PHOS: 65 U/L (ref 39–117)
ALT: 52 U/L (ref 0–53)
AST: 54 U/L — ABNORMAL HIGH (ref 0–37)
Albumin: 3.8 g/dL (ref 3.5–5.2)
BILIRUBIN TOTAL: 0.9 mg/dL (ref 0.2–1.2)
BUN: 28 mg/dL — ABNORMAL HIGH (ref 6–23)
CO2: 28 mEq/L (ref 19–32)
Calcium: 9.2 mg/dL (ref 8.4–10.5)
Chloride: 110 mEq/L (ref 96–112)
Creatinine, Ser: 2.2 mg/dL — ABNORMAL HIGH (ref 0.4–1.5)
GFR: 31.06 mL/min — ABNORMAL LOW (ref >60.00)
Glucose, Bld: 82 mg/dL (ref 70–99)
Potassium: 4.8 mEq/L (ref 3.5–5.1)
SODIUM: 142 meq/L (ref 135–145)
TOTAL PROTEIN: 6.5 g/dL (ref 6.0–8.3)

## 2013-12-12 NOTE — Assessment & Plan Note (Addendum)
Likely stage 3 to 4, depending on labs.  See notes on labs.  Amlodipine dose corrected, was on 10mg .  Would continue off metformin for now.  If sugar increases, then we can address. He agrees.  Would refer to renal.  K now normal.  BP okay.   LFTs resolving.  Would not address statin use at this point.  Would use ACE at current dose, noted to be 50% of prev dose. Off metoprolol and BP okay, using lasix only PRN.  All d/w pt.  .>25 minutes spent in face to face time with patient, >50% spent in counselling or coordination of care.

## 2013-12-13 ENCOUNTER — Telehealth: Payer: Self-pay | Admitting: Family Medicine

## 2013-12-13 NOTE — Telephone Encounter (Signed)
Called patient back and lab results given.

## 2013-12-13 NOTE — Telephone Encounter (Signed)
Pt returned call from [place].  He will not be available today, please give him a call tomorrow 12/14/13 with results.  / lt

## 2014-01-11 DIAGNOSIS — I10 Essential (primary) hypertension: Secondary | ICD-10-CM | POA: Diagnosis not present

## 2014-01-11 DIAGNOSIS — N183 Chronic kidney disease, stage 3 unspecified: Secondary | ICD-10-CM | POA: Diagnosis not present

## 2014-01-11 DIAGNOSIS — E1129 Type 2 diabetes mellitus with other diabetic kidney complication: Secondary | ICD-10-CM | POA: Diagnosis not present

## 2014-01-11 DIAGNOSIS — R609 Edema, unspecified: Secondary | ICD-10-CM | POA: Diagnosis not present

## 2014-01-18 ENCOUNTER — Ambulatory Visit: Payer: Self-pay | Admitting: Nephrology

## 2014-01-18 DIAGNOSIS — E119 Type 2 diabetes mellitus without complications: Secondary | ICD-10-CM | POA: Diagnosis not present

## 2014-01-18 DIAGNOSIS — I10 Essential (primary) hypertension: Secondary | ICD-10-CM | POA: Diagnosis not present

## 2014-01-18 DIAGNOSIS — N183 Chronic kidney disease, stage 3 unspecified: Secondary | ICD-10-CM | POA: Diagnosis not present

## 2014-01-26 DIAGNOSIS — E1129 Type 2 diabetes mellitus with other diabetic kidney complication: Secondary | ICD-10-CM | POA: Diagnosis not present

## 2014-01-26 DIAGNOSIS — N183 Chronic kidney disease, stage 3 unspecified: Secondary | ICD-10-CM | POA: Diagnosis not present

## 2014-01-26 DIAGNOSIS — I10 Essential (primary) hypertension: Secondary | ICD-10-CM | POA: Diagnosis not present

## 2014-01-26 DIAGNOSIS — M109 Gout, unspecified: Secondary | ICD-10-CM | POA: Diagnosis not present

## 2014-01-26 DIAGNOSIS — N2581 Secondary hyperparathyroidism of renal origin: Secondary | ICD-10-CM | POA: Diagnosis not present

## 2014-01-26 DIAGNOSIS — E1165 Type 2 diabetes mellitus with hyperglycemia: Secondary | ICD-10-CM | POA: Diagnosis not present

## 2014-01-30 ENCOUNTER — Other Ambulatory Visit: Payer: Self-pay | Admitting: Internal Medicine

## 2014-01-31 NOTE — Telephone Encounter (Signed)
Sent!

## 2014-02-05 ENCOUNTER — Encounter: Payer: Self-pay | Admitting: Family Medicine

## 2014-02-06 DIAGNOSIS — R894 Abnormal immunological findings in specimens from other organs, systems and tissues: Secondary | ICD-10-CM | POA: Diagnosis not present

## 2014-02-06 DIAGNOSIS — M720 Palmar fascial fibromatosis [Dupuytren]: Secondary | ICD-10-CM | POA: Diagnosis not present

## 2014-02-06 DIAGNOSIS — N179 Acute kidney failure, unspecified: Secondary | ICD-10-CM | POA: Diagnosis not present

## 2014-02-06 DIAGNOSIS — M1A00X0 Idiopathic chronic gout, unspecified site, without tophus (tophi): Secondary | ICD-10-CM | POA: Diagnosis not present

## 2014-02-07 DIAGNOSIS — M359 Systemic involvement of connective tissue, unspecified: Secondary | ICD-10-CM | POA: Diagnosis not present

## 2014-02-07 DIAGNOSIS — R894 Abnormal immunological findings in specimens from other organs, systems and tissues: Secondary | ICD-10-CM | POA: Diagnosis not present

## 2014-02-12 ENCOUNTER — Telehealth: Payer: Self-pay | Admitting: *Deleted

## 2014-02-12 NOTE — Telephone Encounter (Signed)
Received form to be completed and faxed back regarding diabetic supplies. Form is on your desk.

## 2014-02-13 NOTE — Telephone Encounter (Signed)
Form done, thanks ?

## 2014-02-15 NOTE — Telephone Encounter (Signed)
Was this faxed back Tuesday?

## 2014-02-16 NOTE — Telephone Encounter (Signed)
Form faxed and placed in box to be scanned.

## 2014-02-18 LAB — HM DIABETES EYE EXAM

## 2014-02-23 ENCOUNTER — Ambulatory Visit (INDEPENDENT_AMBULATORY_CARE_PROVIDER_SITE_OTHER): Payer: Medicare Other | Admitting: Internal Medicine

## 2014-02-23 ENCOUNTER — Ambulatory Visit (INDEPENDENT_AMBULATORY_CARE_PROVIDER_SITE_OTHER)
Admission: RE | Admit: 2014-02-23 | Discharge: 2014-02-23 | Disposition: A | Discharge status: Home / Self Care | Payer: Medicare Other | Source: Ambulatory Visit | Attending: Internal Medicine | Admitting: Internal Medicine

## 2014-02-23 ENCOUNTER — Encounter: Payer: Self-pay | Admitting: Internal Medicine

## 2014-02-23 VITALS — BP 138/64 | HR 76 | Temp 98.4°F | Wt 210.2 lb

## 2014-02-23 DIAGNOSIS — M109 Gout, unspecified: Secondary | ICD-10-CM

## 2014-02-23 DIAGNOSIS — N183 Chronic kidney disease, stage 3 unspecified: Secondary | ICD-10-CM

## 2014-02-23 DIAGNOSIS — R609 Edema, unspecified: Secondary | ICD-10-CM | POA: Diagnosis not present

## 2014-02-23 DIAGNOSIS — M79672 Pain in left foot: Secondary | ICD-10-CM | POA: Diagnosis not present

## 2014-02-23 DIAGNOSIS — I6529 Occlusion and stenosis of unspecified carotid artery: Secondary | ICD-10-CM | POA: Diagnosis not present

## 2014-02-23 DIAGNOSIS — M1000 Idiopathic gout, unspecified site: Secondary | ICD-10-CM

## 2014-02-23 MED ORDER — COLCHICINE 0.6 MG PO TABS: 0.6000 mg | ORAL_TABLET | Freq: Two times a day (BID) | ORAL | Status: DC | PRN

## 2014-02-23 NOTE — Progress Notes (Signed)
Pre visit review using our clinic review tool, if applicable. No additional management support is needed unless otherwise documented below in the visit note. 

## 2014-02-23 NOTE — Progress Notes (Signed)
Subjective:    Patient ID: Dean Murphy, male    DOB: 06/01/1926, 80 y.o.   MRN: [id number]  HPI Recent visit to Dr Candiss Norse for nephrology Labs done and then went back. Renal function was stable or slightly improved Had anti DNA antibody so sent to Dr Precious Reel (concern for lupus) He did a lot of blood work and got back with him that he didn't have lupus  Ongoing bad foot and leg pain Also with swelling Concern that amlodipine is causing swelling Great toe on left had turned black--better now Knot under left 5th toe and pain along lateral midfoot Hard to even walk for a week  Continues on uloric Past gout only in 1st MTPs  Some swelling Better today but still mild left foot swelling  Furosemide was changed to prn Hasn't needed in the past week when the pain is so bad No sensory changes in the feet from diabetes  Current Outpatient Prescriptions on File Prior to Visit  Medication Sig Dispense Refill  . AMITIZA 8 MCG capsule TAKE 1 CAPSULE DAILY WITH  BREAKFAST  90 capsule  3  . amLODipine (NORVASC) 10 MG tablet Take 10 mg by mouth daily.      [name] aspirin 325 MG tablet Take 325 mg by mouth daily.        . benazepril (LOTENSIN) 20 MG tablet Take 1 tablet (20 mg total) by mouth daily.  90 tablet  3  . colchicine 0.6 MG tablet Take 1 tablet (0.6 mg total) by mouth daily as needed. As needed for gout attacks   Stop afterwards  30 tablet  1  . febuxostat (ULORIC) 40 MG tablet Take 1 tablet (40 mg total) by mouth daily.  90 tablet  3  . glipiZIDE (GLUCOTROL) 10 MG tablet Take 1 tablet (10 mg total) by mouth daily before breakfast.  90 tablet  3  . NON FORMULARY daily. careconcepts lancets an d glucometer strips      . omeprazole (PRILOSEC) 20 MG capsule Take 1 capsule (20 mg total) by mouth daily.  90 capsule  3  . furosemide (LASIX) 40 MG tablet Take 1 tablet (40 mg total) by mouth daily.  90 tablet  3   No current facility-administered medications on file prior to visit.     Allergies  Allergen Reactions  . Reclast [Zoledronic Acid]     Stopped 2015 due to Cr    Past Medical History  Diagnosis Date  . Diabetes mellitus   . GERD (gastroesophageal reflux disease)   . Hyperlipidemia   . Hypertension   . Arthritis   . Diverticulosis   . Hemorrhoids   . Colon polyps   . CAD (coronary artery disease)   . Chronic kidney disease   . Osteoporosis   . Chronic kidney disease, stage IV (severe)     stage III/IV as of 2015    Past Surgical History  Procedure Laterality Date  . Coronary artery bypass graft    . Cholecystectomy    . Total knee arthroplasty      bilateral  . Tonsillectomy    . Carotid endarterectomy  12/31/10    Right  . Carotid endarterectomy  02/09/11    left  . Joint replacement      bilat. knees    Family History  Problem Relation Age of Onset  . Diabetes    . Cancer    . Heart disease Mother   . Heart attack Father  History   Social History  . Marital Status: Married    Spouse Name: N/A    Number of Children: N/A  . Years of Education: N/A   Occupational History  . retired    Social History Main Topics  . Smoking status: Former Smoker -- 0.50 packs/day for 10 years    Types: Cigarettes    Quit date: 01/22/1971  . Smokeless tobacco: Never Used     Comment: smoked in high school  . Alcohol Use: Yes     Comment: 1-2 per week  . Drug Use: No  . Sexual Activity: Not on file   Other Topics Concern  . 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  Remarried 1998.     3 sons   Review of Systems No fever No other swelling No SOB Sleeps flat and no PND Nocturia stable at once a night No foot injury that he remembers    Objective:   Physical Exam  Constitutional: He appears well-developed and well-nourished. No distress.  Neck: Normal range of motion. Neck supple. No thyromegaly present.  Cardiovascular: Normal rate, regular  rhythm, normal heart sounds and intact distal pulses.  Exam reveals no gallop.   No murmur heard. Pulmonary/Chest: Effort normal. No respiratory distress. He has no wheezes.  Fine bibasilar crackles  Abdominal: Soft. There is no tenderness.  Musculoskeletal:  Mild edema of left foot with slight generalized warmth Marked redness and exquisite tenderness of proximal 5th metatarsal. No MTP or ankle tenderness (though puffy). No other synovitis--hands, knees, etc  Lymphadenopathy:    He has no cervical adenopathy.          Assessment & Plan:

## 2014-02-23 NOTE — Assessment & Plan Note (Signed)
The localization and severity of pain (he really is limping) suggest gout--despite the uloric Will add colchicine Needs follow up next week if not better Will check x-ray just in case

## 2014-02-23 NOTE — Patient Instructions (Signed)
Please start the colchicine today. Take your first tab and then another in 2-3 hours. Continue this twice a day if it helps--then after a week or so, go down to once a day for a week---then stop. Call on Monday if your foot is not much better.  Please try the furosemide 2-3 times per week till your feet and hand swelling is better. It would be good to check your weight every morning and bring in those numbers for Dr Damita Dunnings

## 2014-02-23 NOTE — Assessment & Plan Note (Signed)
Did improve some after visit to Dr Candiss Norse Then saw Dr Roselind Rily of those notes available Does have some weight gain Will ask him to take a few doses of the furosemide in the next week to see how that helps (and before he gets repeat blood work)

## 2014-03-01 DIAGNOSIS — Z961 Presence of intraocular lens: Secondary | ICD-10-CM | POA: Diagnosis not present

## 2014-03-01 DIAGNOSIS — H349 Unspecified retinal vascular occlusion: Secondary | ICD-10-CM | POA: Diagnosis not present

## 2014-03-01 DIAGNOSIS — H4011X1 Primary open-angle glaucoma, mild stage: Secondary | ICD-10-CM | POA: Diagnosis not present

## 2014-03-01 DIAGNOSIS — E11329 Type 2 diabetes mellitus with mild nonproliferative diabetic retinopathy without macular edema: Secondary | ICD-10-CM | POA: Diagnosis not present

## 2014-03-11 ENCOUNTER — Other Ambulatory Visit: Payer: Self-pay | Admitting: Family Medicine

## 2014-03-11 DIAGNOSIS — E1122 Type 2 diabetes mellitus with diabetic chronic kidney disease: Secondary | ICD-10-CM

## 2014-03-12 ENCOUNTER — Other Ambulatory Visit (INDEPENDENT_AMBULATORY_CARE_PROVIDER_SITE_OTHER): Payer: Medicare Other

## 2014-03-12 DIAGNOSIS — E785 Hyperlipidemia, unspecified: Secondary | ICD-10-CM | POA: Diagnosis not present

## 2014-03-12 DIAGNOSIS — N189 Chronic kidney disease, unspecified: Secondary | ICD-10-CM | POA: Diagnosis not present

## 2014-03-12 DIAGNOSIS — E1122 Type 2 diabetes mellitus with diabetic chronic kidney disease: Secondary | ICD-10-CM | POA: Diagnosis not present

## 2014-03-12 LAB — COMPREHENSIVE METABOLIC PANEL
ALT: 53 U/L (ref 0–53)
AST: 48 U/L — ABNORMAL HIGH (ref 0–37)
Albumin: 3.3 g/dL — ABNORMAL LOW (ref 3.5–5.2)
Alkaline Phosphatase: 83 U/L (ref 39–117)
BILIRUBIN TOTAL: 0.6 mg/dL (ref 0.2–1.2)
BUN: 39 mg/dL — ABNORMAL HIGH (ref 6–23)
CO2: 22 meq/L (ref 19–32)
CREATININE: 2.6 mg/dL — AB (ref 0.4–1.5)
Calcium: 8.7 mg/dL (ref 8.4–10.5)
Chloride: 111 mEq/L (ref 96–112)
GFR: 25.29 mL/min — AB (ref >60.00)
Glucose, Bld: 185 mg/dL — ABNORMAL HIGH (ref 70–99)
Potassium: 4.4 mEq/L (ref 3.5–5.1)
Sodium: 142 mEq/L (ref 135–145)
Total Protein: 6.6 g/dL (ref 6.0–8.3)

## 2014-03-12 LAB — HEMOGLOBIN A1C: Hgb A1c MFr Bld: 7.3 % — ABNORMAL HIGH (ref 4.6–6.5)

## 2014-03-12 LAB — LDL CHOLESTEROL, DIRECT: Direct LDL: 135.1 mg/dL

## 2014-03-12 LAB — LIPID PANEL
Cholesterol: 211 mg/dL — ABNORMAL HIGH (ref 0–200)
HDL: 26.2 mg/dL — AB (ref >39.00)
NonHDL: 184.8
Total CHOL/HDL Ratio: 8
Triglycerides: 243 mg/dL — ABNORMAL HIGH (ref 0.0–149.0)
VLDL: 48.6 mg/dL — AB (ref 0.0–40.0)

## 2014-03-15 ENCOUNTER — Ambulatory Visit (INDEPENDENT_AMBULATORY_CARE_PROVIDER_SITE_OTHER): Payer: Medicare Other | Admitting: Family Medicine

## 2014-03-15 ENCOUNTER — Encounter: Payer: Self-pay | Admitting: Family Medicine

## 2014-03-15 VITALS — BP 154/72 | HR 72 | Temp 97.6°F | Wt 208.0 lb

## 2014-03-15 DIAGNOSIS — M1009 Idiopathic gout, multiple sites: Secondary | ICD-10-CM

## 2014-03-15 DIAGNOSIS — N189 Chronic kidney disease, unspecified: Secondary | ICD-10-CM

## 2014-03-15 DIAGNOSIS — E1122 Type 2 diabetes mellitus with diabetic chronic kidney disease: Secondary | ICD-10-CM | POA: Diagnosis not present

## 2014-03-15 DIAGNOSIS — M109 Gout, unspecified: Secondary | ICD-10-CM

## 2014-03-15 DIAGNOSIS — N183 Chronic kidney disease, stage 3 unspecified: Secondary | ICD-10-CM

## 2014-03-15 DIAGNOSIS — I6529 Occlusion and stenosis of unspecified carotid artery: Secondary | ICD-10-CM | POA: Diagnosis not present

## 2014-03-15 DIAGNOSIS — Z23 Encounter for immunization: Secondary | ICD-10-CM

## 2014-03-15 MED ORDER — FUROSEMIDE 40 MG PO TABS: 40.0000 mg | ORAL_TABLET | Freq: Every day | ORAL | Status: DC | PRN

## 2014-03-15 MED ORDER — AMLODIPINE BESYLATE 10 MG PO TABS: 10.0000 mg | ORAL_TABLET | Freq: Every day | ORAL | Status: DC

## 2014-03-15 NOTE — Progress Notes (Signed)
Pre visit review using our clinic review tool, if applicable. No additional management support is needed unless otherwise documented below in the visit note.  CKD.  He saw renal, then rheum for SLE w/u.  Thought not to have SLE.  D/w pt.  Started on gabapentin per uro.  He slept better on it, but didn't have effect o/w. He wanted to come off it if possible. Discussed, this is reasonable.   Had a gout flare and that improved with colchicine.  Still on uloric.    Diabetes:  Using medications without difficulties:yes Hypoglycemic episodes:no Hyperglycemic episodes:no Feet problems: see above re: gout.  occ tingling in the feet B.  Gabapentin didn't help the foot sensation but he did sleep better at night with it.  Blood Sugars averaging: 100-150 eye exam within last year: yes, 3-4 weeks ago.   PMH and SH reviewed  Meds, vitals, and allergies reviewed.   ROS: See HPI.  Otherwise negative.    GEN: nad, alert and oriented HEENT: mucous membranes moist NECK: supple w/o LA CV: rrr. PULM: ctab, no inc wob ABD: soft, +bs EXT: no edema SKIN: no acute rash  Diabetic foot exam: Normal inspection No skin breakdown No calluses  1+ DP pulses Normal sensation to light touch and monofilament Nails normal

## 2014-03-15 NOTE — Patient Instructions (Addendum)
Stop the gabapentin.   You could try OTC unisom or melatonin for sleep.   Don't change your meds for now.  Recheck in about 3 months, labs ahead of time.  Glad to see you.

## 2014-03-16 NOTE — Assessment & Plan Note (Signed)
Per renal, d/w pt re: recent labs. He has f/u with renal pending.

## 2014-03-16 NOTE — Assessment & Plan Note (Signed)
Now resolved, no sx.  Continue as is.  He agrees.

## 2014-03-16 NOTE — Assessment & Plan Note (Signed)
A1c near goal, I didn't adjust his meds for now. Continue with DM2 diet.  D/w Pt. I didn't adjust his BP meds given his renal situation.  All d/w pt.  We can add on a statin later, but I'd prefer him to have f/u with renal first.  >25 minutes spent in face to face time with patient, >50% spent in counselling or coordination of care.

## 2014-03-18 ENCOUNTER — Encounter: Payer: Self-pay | Admitting: Family Medicine

## 2014-04-23 ENCOUNTER — Telehealth: Payer: Self-pay

## 2014-04-23 NOTE — Telephone Encounter (Signed)
Error

## 2014-04-25 ENCOUNTER — Telehealth: Payer: Self-pay | Admitting: *Deleted

## 2014-04-25 MED ORDER — AMLODIPINE BESYLATE 10 MG PO TABS: 10.0000 mg | ORAL_TABLET | Freq: Every day | ORAL | Status: DC

## 2014-04-25 NOTE — Telephone Encounter (Signed)
Pt left v/m requesting refill amlodipine to [facility]; already done. Pt line is busy and will try again.

## 2014-04-25 NOTE — Telephone Encounter (Signed)
Pt notified refill done

## 2014-05-31 ENCOUNTER — Encounter: Payer: Self-pay | Admitting: Internal Medicine

## 2014-05-31 ENCOUNTER — Ambulatory Visit (INDEPENDENT_AMBULATORY_CARE_PROVIDER_SITE_OTHER): Payer: Medicare Other | Admitting: Internal Medicine

## 2014-05-31 VITALS — BP 140/64 | HR 72 | Temp 97.7°F | Wt 208.0 lb

## 2014-05-31 DIAGNOSIS — J069 Acute upper respiratory infection, unspecified: Secondary | ICD-10-CM

## 2014-05-31 MED ORDER — AZITHROMYCIN 250 MG PO TABS: ORAL_TABLET | ORAL | Status: DC

## 2014-05-31 NOTE — Progress Notes (Signed)
Pre visit review using our clinic review tool, if applicable. No additional management support is needed unless otherwise documented below in the visit note. 

## 2014-05-31 NOTE — Progress Notes (Signed)
HPI  Pt presents to the clinic today with c/o cough and chest congestion. He reports this started 2 week ago. The cough is productive of green mucous. He has had an associated runny nose. He has taken Robitussin with limited relief. He has no history of allergies or breathing problems. He has had sick family contacts. He is a former smoker.  Review of Systems      Past Medical History  Diagnosis Date  . Diabetes mellitus   . GERD (gastroesophageal reflux disease)   . Hyperlipidemia   . Hypertension   . Arthritis   . Diverticulosis   . Hemorrhoids   . Colon polyps   . CAD (coronary artery disease)   . Chronic kidney disease   . Osteoporosis   . Chronic kidney disease, stage IV (severe)     stage III/IV as of 2015    Family History  Problem Relation Age of Onset  . Diabetes    . Cancer    . Heart disease Mother   . Heart attack Father     History   Social History  . Marital Status: Married    Spouse Name: N/A    Number of Children: N/A  . Years of Education: N/A   Occupational History  . retired    Social History Main Topics  . Smoking status: Former Smoker -- 0.50 packs/day for 10 years    Types: Cigarettes    Quit date: 01/22/1971  . Smokeless tobacco: Never Used     Comment: smoked in high school  . Alcohol Use: Yes     Comment: 1-2 per week  . Drug Use: No  . Sexual Activity: Not on file   Other Topics Concern  . 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  Remarried 1998.     3 sons    Allergies  Allergen Reactions  . Reclast [Zoledronic Acid]     Stopped 2015 due to Cr     Constitutional: Positive fatigue. No fever or chills.  Denies abrupt weight changes.  HEENT:  Positive sore throat. Denies eye redness, eye pain, pressure behind the eyes, facial pain, nasal congestion, ear pain, ringing in the ears, wax buildup, runny nose or bloody nose. Respiratory: Positive  cough. Denies hemoptysis, difficulty breathing, and shortness of breath.  Cardiovascular: Denies chest pain, chest tightness, palpitations or swelling in the hands or feet.   No other specific complaints in a complete review of systems (except as listed in HPI above).  Objective:   BP 140/64 mmHg  Pulse 72  Temp(Src) 97.7 F (36.5 C) (Oral)  Wt 208 lb (94.348 kg)  SpO2 98% Wt Readings from Last 3 Encounters:  05/31/14 208 lb (94.348 kg)  03/15/14 208 lb (94.348 kg)  02/23/14 210 lb 4 oz (95.369 kg)   General: Appears his stated age, well developed, well nourished in NAD. HEENT: Head: normal shape and size, no sinus tenderness noted; Eyes: sclera white, no icterus, conjunctiva pink; Ears: Tm's gray and intact, normal light reflex; Nose: mucosa pink and moist, septum midline; Throat/Mouth: Teeth present, mucosa erythematous and moist, no exudate noted, no lesions or ulcerations noted.  Neck: No lymphadenopathy noted. Cardiovascular: Normal rate and rhythm. S1,S2 noted.  No murmur, rubs or gallops noted.  Pulmonary/Chest: Normal effort and positive vesicular breath sounds. No respiratory distress. No wheezes, rales or ronchi noted.  Assessment & Plan:   Upper Respiratory Infection:  Get some rest and drink plenty of water Do salt water gargles for the sore throat eRx for Azithromax x 5 days Continue Robitussin for cough  RTC as needed or if symptoms persist.

## 2014-05-31 NOTE — Patient Instructions (Signed)
Cough, Adult  A cough is a reflex that helps clear your throat and airways. It can help heal the body or may be a reaction to an irritated airway. A cough may only last 2 or 3 weeks (acute) or may last more than 8 weeks (chronic).  CAUSES Acute cough:  Viral or bacterial infections. Chronic cough:  Infections.  Allergies.  Asthma.  Post-nasal drip.  Smoking.  Heartburn or acid reflux.  Some medicines.  Chronic lung problems (COPD).  Cancer. SYMPTOMS   Cough.  Fever.  Chest pain.  Increased breathing rate.  High-pitched whistling sound when breathing (wheezing).  Colored mucus that you cough up (sputum). TREATMENT   A bacterial cough may be treated with antibiotic medicine.  A viral cough must run its course and will not respond to antibiotics.  Your caregiver may recommend other treatments if you have a chronic cough. HOME CARE INSTRUCTIONS   Only take over-the-counter or prescription medicines for pain, discomfort, or fever as directed by your caregiver. Use cough suppressants only as directed by your caregiver.  Use a cold steam vaporizer or humidifier in your bedroom or home to help loosen secretions.  Sleep in a semi-upright position if your cough is worse at night.  Rest as needed.  Stop smoking if you smoke. SEEK IMMEDIATE MEDICAL CARE IF:   You have pus in your sputum.  Your cough starts to worsen.  You cannot control your cough with suppressants and are losing sleep.  You begin coughing up blood.  You have difficulty breathing.  You develop pain which is getting worse or is uncontrolled with medicine.  You have a fever. MAKE SURE YOU:   Understand these instructions.  Will watch your condition.  Will get help right away if you are not doing well or get worse. Document Released: 11/07/2010 Document Revised: 08/03/2011 Document Reviewed: 11/07/2010 Exit[employer] Patient Information 2015 [employer]. This information is not intended  to replace advice given to you by your health care provider. Make sure you discuss any questions you have with your health care provider.  

## 2014-06-04 DIAGNOSIS — N183 Chronic kidney disease, stage 3 (moderate): Secondary | ICD-10-CM | POA: Diagnosis not present

## 2014-06-04 DIAGNOSIS — M109 Gout, unspecified: Secondary | ICD-10-CM | POA: Diagnosis not present

## 2014-06-04 DIAGNOSIS — I10 Essential (primary) hypertension: Secondary | ICD-10-CM | POA: Diagnosis not present

## 2014-06-04 DIAGNOSIS — E1129 Type 2 diabetes mellitus with other diabetic kidney complication: Secondary | ICD-10-CM | POA: Diagnosis not present

## 2014-06-10 ENCOUNTER — Other Ambulatory Visit: Payer: Self-pay | Admitting: Family Medicine

## 2014-06-10 DIAGNOSIS — E1122 Type 2 diabetes mellitus with diabetic chronic kidney disease: Secondary | ICD-10-CM

## 2014-06-12 ENCOUNTER — Other Ambulatory Visit (INDEPENDENT_AMBULATORY_CARE_PROVIDER_SITE_OTHER): Payer: Medicare Other

## 2014-06-12 DIAGNOSIS — E1122 Type 2 diabetes mellitus with diabetic chronic kidney disease: Secondary | ICD-10-CM | POA: Diagnosis not present

## 2014-06-12 DIAGNOSIS — N189 Chronic kidney disease, unspecified: Secondary | ICD-10-CM | POA: Diagnosis not present

## 2014-06-12 LAB — BASIC METABOLIC PANEL
BUN: 20 mg/dL (ref 6–23)
CO2: 23 meq/L (ref 19–32)
CREATININE: 1.49 mg/dL (ref 0.40–1.50)
Calcium: 9.2 mg/dL (ref 8.4–10.5)
Chloride: 112 mEq/L (ref 96–112)
GFR: 47.62 mL/min — ABNORMAL LOW (ref >60.00)
GLUCOSE: 146 mg/dL — AB (ref 70–99)
Potassium: 4.5 mEq/L (ref 3.5–5.1)
Sodium: 141 mEq/L (ref 135–145)

## 2014-06-12 LAB — HEMOGLOBIN A1C: Hgb A1c MFr Bld: 7.4 % — ABNORMAL HIGH (ref 4.6–6.5)

## 2014-06-13 ENCOUNTER — Encounter: Payer: Self-pay | Admitting: Family Medicine

## 2014-06-13 ENCOUNTER — Ambulatory Visit (INDEPENDENT_AMBULATORY_CARE_PROVIDER_SITE_OTHER): Payer: Medicare Other | Admitting: Family Medicine

## 2014-06-13 VITALS — BP 150/70 | HR 76 | Temp 98.6°F | Wt 205.2 lb

## 2014-06-13 DIAGNOSIS — R059 Cough, unspecified: Secondary | ICD-10-CM

## 2014-06-13 DIAGNOSIS — R05 Cough: Secondary | ICD-10-CM | POA: Diagnosis not present

## 2014-06-13 MED ORDER — HYDROCODONE-HOMATROPINE 5-1.5 MG/5ML PO SYRP: 2.5000 mL | ORAL_SOLUTION | Freq: Three times a day (TID) | ORAL | Status: DC | PRN

## 2014-06-13 MED ORDER — AMOXICILLIN-POT CLAVULANATE 875-125 MG PO TABS: 1.0000 | ORAL_TABLET | Freq: Two times a day (BID) | ORAL | Status: DC

## 2014-06-13 NOTE — Progress Notes (Signed)
Pre visit review using our clinic review tool, if applicable. No additional management support is needed unless otherwise documented below in the visit note.  Sx started xmas eve "and I've been sick ever since."  Seen prev prev and improved some but not resolved on zmax prev.  Can't sleep, coughing laying down.  Discolored sputum.  ST, pain with swallowing.  Taken robitussin and mucinex, "and a half a bag of cough drops."    Meds, vitals, and allergies reviewed.   ROS: See HPI.  Otherwise, noncontributory.  GEN: nad, alert and oriented HEENT: mucous membranes moist, tm w/o erythema, nasal exam w/o erythema, clear discharge noted,  OP with cobblestoning NECK: supple w/o LA CV: rrr.   PULM: Coarse BS with UAN but o/w ctab, no inc wob EXT: no edema SKIN: no acute rash

## 2014-06-13 NOTE — Patient Instructions (Signed)
Take the cough medicine as needed.  It can make you drowsy.  Start augmentin tonight.  Take care.  Glad to see you.

## 2014-06-14 DIAGNOSIS — R05 Cough: Secondary | ICD-10-CM | POA: Insufficient documentation

## 2014-06-14 DIAGNOSIS — R059 Cough, unspecified: Secondary | ICD-10-CM | POA: Insufficient documentation

## 2014-06-14 NOTE — Assessment & Plan Note (Signed)
Not resolved, given duration would treat. Likely upper airway sx causing the cough, ie post nasal gtt.  Nontoxic. Start augmentin and fu prn.  He agrees. Sedation caution on cough med.

## 2014-06-19 ENCOUNTER — Ambulatory Visit (INDEPENDENT_AMBULATORY_CARE_PROVIDER_SITE_OTHER): Payer: Medicare Other | Admitting: Family Medicine

## 2014-06-19 ENCOUNTER — Encounter: Payer: Self-pay | Admitting: Family Medicine

## 2014-06-19 VITALS — BP 136/56 | HR 71 | Temp 97.4°F | Wt 209.5 lb

## 2014-06-19 DIAGNOSIS — N183 Chronic kidney disease, stage 3 unspecified: Secondary | ICD-10-CM

## 2014-06-19 DIAGNOSIS — N189 Chronic kidney disease, unspecified: Secondary | ICD-10-CM | POA: Diagnosis not present

## 2014-06-19 DIAGNOSIS — E1122 Type 2 diabetes mellitus with diabetic chronic kidney disease: Secondary | ICD-10-CM | POA: Diagnosis not present

## 2014-06-19 NOTE — Patient Instructions (Signed)
Don't change your meds for now.  recheck summer of 2016 with labs ahead of time, after the renal appointment in June.  Take care.  Glad to see you.

## 2014-06-19 NOTE — Progress Notes (Signed)
Pre visit review using our clinic review tool, if applicable. No additional management support is needed unless otherwise documented below in the visit note.  Recently started on abx, some nausea but this is manageable.  Much less cough now and feels better.    Recently with some gout pain in the foot but much improved with colchicine.   Here for CKD and DM2 f/u.  Diabetes:  Using medications without difficulties:yes Hypoglycemic episodes:not since ~ xmas 2015, he got down to ~40, resolved with a snack. No clear trigger for the event.  Hyperglycemic episodes: no Feet problems:no Blood Sugars averaging: ~115 usually eye exam within last year:yes Labs d/w pt. A1c acceptable, esp 7-7.5.  D/w pt.   CKD- has seen renal.  Cr much improved, labs d/w pt.  No BLE edema now.  Feels well overall.   Meds, vitals, and allergies reviewed.   ROS: See HPI.  Otherwise negative.    GEN: nad, alert and oriented HEENT: mucous membranes moist NECK: supple w/o LA CV: rrr. PULM: ctab, no inc wob ABD: soft, +bs EXT: no edema SKIN: no acute rash

## 2014-06-20 NOTE — Assessment & Plan Note (Signed)
Cxr much improved, d/w pt.  Continue as is.  He agrees.

## 2014-06-20 NOTE — Assessment & Plan Note (Signed)
A1c stable, no change in meds. Will recheck patient after renal visit this summer.  He agrees. Continue DM2 diet.

## 2014-07-24 DIAGNOSIS — I480 Paroxysmal atrial fibrillation: Secondary | ICD-10-CM

## 2014-07-24 DIAGNOSIS — I442 Atrioventricular block, complete: Secondary | ICD-10-CM

## 2014-07-24 DIAGNOSIS — I5030 Unspecified diastolic (congestive) heart failure: Secondary | ICD-10-CM

## 2014-07-24 HISTORY — DX: Unspecified diastolic (congestive) heart failure: I50.30

## 2014-07-24 HISTORY — DX: Atrioventricular block, complete: I44.2

## 2014-07-24 HISTORY — DX: Paroxysmal atrial fibrillation: I48.0

## 2014-08-11 ENCOUNTER — Encounter (HOSPITAL_COMMUNITY)
Admission: EM | Disposition: A | Discharge status: Home / Self Care | Payer: Self-pay | Source: Home / Self Care | Attending: Cardiovascular Disease

## 2014-08-11 ENCOUNTER — Emergency Department (HOSPITAL_COMMUNITY): Payer: Medicare Other

## 2014-08-11 ENCOUNTER — Encounter (HOSPITAL_COMMUNITY): Payer: Self-pay | Admitting: Emergency Medicine

## 2014-08-11 ENCOUNTER — Inpatient Hospital Stay (HOSPITAL_COMMUNITY)
Admission: EM | Admit: 2014-08-11 | Discharge: 2014-08-15 | DRG: 242 | Disposition: A | Discharge status: Home / Self Care | Payer: Medicare Other | Attending: Cardiovascular Disease | Admitting: Cardiovascular Disease

## 2014-08-11 DIAGNOSIS — I442 Atrioventricular block, complete: Secondary | ICD-10-CM | POA: Diagnosis not present

## 2014-08-11 DIAGNOSIS — R079 Chest pain, unspecified: Secondary | ICD-10-CM | POA: Diagnosis present

## 2014-08-11 DIAGNOSIS — I5033 Acute on chronic diastolic (congestive) heart failure: Secondary | ICD-10-CM | POA: Diagnosis not present

## 2014-08-11 DIAGNOSIS — Z79899 Other long term (current) drug therapy: Secondary | ICD-10-CM

## 2014-08-11 DIAGNOSIS — I10 Essential (primary) hypertension: Secondary | ICD-10-CM | POA: Diagnosis not present

## 2014-08-11 DIAGNOSIS — I129 Hypertensive chronic kidney disease with stage 1 through stage 4 chronic kidney disease, or unspecified chronic kidney disease: Secondary | ICD-10-CM | POA: Diagnosis present

## 2014-08-11 DIAGNOSIS — I2510 Atherosclerotic heart disease of native coronary artery without angina pectoris: Secondary | ICD-10-CM | POA: Diagnosis present

## 2014-08-11 DIAGNOSIS — Z950 Presence of cardiac pacemaker: Secondary | ICD-10-CM | POA: Diagnosis not present

## 2014-08-11 DIAGNOSIS — Z96653 Presence of artificial knee joint, bilateral: Secondary | ICD-10-CM | POA: Diagnosis present

## 2014-08-11 DIAGNOSIS — E1129 Type 2 diabetes mellitus with other diabetic kidney complication: Secondary | ICD-10-CM | POA: Diagnosis present

## 2014-08-11 DIAGNOSIS — R0789 Other chest pain: Secondary | ICD-10-CM | POA: Diagnosis not present

## 2014-08-11 DIAGNOSIS — J811 Chronic pulmonary edema: Secondary | ICD-10-CM | POA: Diagnosis not present

## 2014-08-11 DIAGNOSIS — E1122 Type 2 diabetes mellitus with diabetic chronic kidney disease: Secondary | ICD-10-CM | POA: Diagnosis present

## 2014-08-11 DIAGNOSIS — R0902 Hypoxemia: Secondary | ICD-10-CM | POA: Diagnosis not present

## 2014-08-11 DIAGNOSIS — I480 Paroxysmal atrial fibrillation: Secondary | ICD-10-CM | POA: Diagnosis present

## 2014-08-11 DIAGNOSIS — Z792 Long term (current) use of antibiotics: Secondary | ICD-10-CM

## 2014-08-11 DIAGNOSIS — J9811 Atelectasis: Secondary | ICD-10-CM | POA: Diagnosis present

## 2014-08-11 DIAGNOSIS — J810 Acute pulmonary edema: Secondary | ICD-10-CM | POA: Diagnosis not present

## 2014-08-11 DIAGNOSIS — I214 Non-ST elevation (NSTEMI) myocardial infarction: Secondary | ICD-10-CM

## 2014-08-11 DIAGNOSIS — K219 Gastro-esophageal reflux disease without esophagitis: Secondary | ICD-10-CM | POA: Diagnosis present

## 2014-08-11 DIAGNOSIS — E785 Hyperlipidemia, unspecified: Secondary | ICD-10-CM | POA: Diagnosis not present

## 2014-08-11 DIAGNOSIS — Z951 Presence of aortocoronary bypass graft: Secondary | ICD-10-CM

## 2014-08-11 DIAGNOSIS — Z87891 Personal history of nicotine dependence: Secondary | ICD-10-CM | POA: Diagnosis not present

## 2014-08-11 DIAGNOSIS — R55 Syncope and collapse: Secondary | ICD-10-CM | POA: Diagnosis present

## 2014-08-11 DIAGNOSIS — I739 Peripheral vascular disease, unspecified: Secondary | ICD-10-CM | POA: Diagnosis present

## 2014-08-11 DIAGNOSIS — N179 Acute kidney failure, unspecified: Secondary | ICD-10-CM

## 2014-08-11 DIAGNOSIS — I4891 Unspecified atrial fibrillation: Secondary | ICD-10-CM | POA: Diagnosis present

## 2014-08-11 DIAGNOSIS — R7989 Other specified abnormal findings of blood chemistry: Secondary | ICD-10-CM | POA: Diagnosis present

## 2014-08-11 DIAGNOSIS — W010XXA Fall on same level from slipping, tripping and stumbling without subsequent striking against object, initial encounter: Secondary | ICD-10-CM | POA: Diagnosis present

## 2014-08-11 DIAGNOSIS — Z79891 Long term (current) use of opiate analgesic: Secondary | ICD-10-CM

## 2014-08-11 DIAGNOSIS — N183 Chronic kidney disease, stage 3 unspecified: Secondary | ICD-10-CM | POA: Diagnosis present

## 2014-08-11 DIAGNOSIS — N189 Chronic kidney disease, unspecified: Secondary | ICD-10-CM | POA: Diagnosis present

## 2014-08-11 DIAGNOSIS — Z7982 Long term (current) use of aspirin: Secondary | ICD-10-CM | POA: Diagnosis not present

## 2014-08-11 DIAGNOSIS — Z888 Allergy status to other drugs, medicaments and biological substances status: Secondary | ICD-10-CM

## 2014-08-11 DIAGNOSIS — R778 Other specified abnormalities of plasma proteins: Secondary | ICD-10-CM | POA: Diagnosis present

## 2014-08-11 DIAGNOSIS — I459 Conduction disorder, unspecified: Secondary | ICD-10-CM | POA: Diagnosis not present

## 2014-08-11 DIAGNOSIS — Z833 Family history of diabetes mellitus: Secondary | ICD-10-CM

## 2014-08-11 DIAGNOSIS — Z8249 Family history of ischemic heart disease and other diseases of the circulatory system: Secondary | ICD-10-CM

## 2014-08-11 DIAGNOSIS — J90 Pleural effusion, not elsewhere classified: Secondary | ICD-10-CM | POA: Diagnosis not present

## 2014-08-11 DIAGNOSIS — I340 Nonrheumatic mitral (valve) insufficiency: Secondary | ICD-10-CM | POA: Diagnosis not present

## 2014-08-11 HISTORY — DX: Atrioventricular block, complete: I44.2

## 2014-08-11 HISTORY — DX: Non-ST elevation (NSTEMI) myocardial infarction: I21.4

## 2014-08-11 HISTORY — PX: CARDIAC CATHETERIZATION: SHX172

## 2014-08-11 LAB — COMPREHENSIVE METABOLIC PANEL
ALT: 25 U/L (ref 0–53)
ANION GAP: 10 (ref 5–15)
AST: 27 U/L (ref 0–37)
Albumin: 3.5 g/dL (ref 3.5–5.2)
Alkaline Phosphatase: 86 U/L (ref 39–117)
BUN: 38 mg/dL — AB (ref 6–23)
CALCIUM: 8.8 mg/dL (ref 8.4–10.5)
CHLORIDE: 111 mmol/L (ref 96–112)
CO2: 21 mmol/L (ref 19–32)
Creatinine, Ser: 3.58 mg/dL — ABNORMAL HIGH (ref 0.50–1.35)
GFR calc Af Amer: 16 mL/min — ABNORMAL LOW (ref >90)
GFR, EST NON AFRICAN AMERICAN: 14 mL/min — AB (ref >90)
GLUCOSE: 192 mg/dL — AB (ref 70–99)
Potassium: 4.3 mmol/L (ref 3.5–5.1)
Sodium: 142 mmol/L (ref 135–145)
Total Bilirubin: 0.8 mg/dL (ref 0.3–1.2)
Total Protein: 6.1 g/dL (ref 6.0–8.3)

## 2014-08-11 LAB — CBC
HCT: 35.3 % — ABNORMAL LOW (ref 39.0–52.0)
Hemoglobin: 11.4 g/dL — ABNORMAL LOW (ref 13.0–17.0)
MCH: 28.6 pg (ref 26.0–34.0)
MCHC: 32.3 g/dL (ref 30.0–36.0)
MCV: 88.5 fL (ref 78.0–100.0)
Platelets: 126 10*3/uL — ABNORMAL LOW (ref 150–400)
RBC: 3.99 MIL/uL — ABNORMAL LOW (ref 4.22–5.81)
RDW: 14 % (ref 11.5–15.5)
WBC: 7.4 10*3/uL (ref 4.0–10.5)

## 2014-08-11 LAB — PROTIME-INR
INR: 1.16 (ref 0.00–1.49)
Prothrombin Time: 14.9 seconds (ref 11.6–15.2)

## 2014-08-11 LAB — MRSA PCR SCREENING: MRSA by PCR: NEGATIVE

## 2014-08-11 LAB — TROPONIN I: Troponin I: 0.35 ng/mL — ABNORMAL HIGH (ref <0.031)

## 2014-08-11 LAB — BRAIN NATRIURETIC PEPTIDE: B NATRIURETIC PEPTIDE 5: 684.3 pg/mL — AB (ref 0.0–100.0)

## 2014-08-11 SURGERY — TEMPORARY PACEMAKER

## 2014-08-11 MED ORDER — HEPARIN BOLUS VIA INFUSION
3000.0000 [IU] | Freq: Once | INTRAVENOUS | Status: AC
  Administered 2014-08-11: 3000 [IU] via INTRAVENOUS
  Filled 2014-08-11: qty 3000

## 2014-08-11 MED ORDER — ASPIRIN 81 MG PO CHEW
81.0000 mg | CHEWABLE_TABLET | Freq: Every day | ORAL | Status: DC
  Administered 2014-08-12 – 2014-08-15 (×4): 81 mg via ORAL
  Filled 2014-08-11 (×4): qty 1

## 2014-08-11 MED ORDER — ONDANSETRON HCL 4 MG/2ML IJ SOLN: 4.0000 mg | Freq: Four times a day (QID) | INTRAMUSCULAR | Status: DC | PRN

## 2014-08-11 MED ORDER — HEPARIN (PORCINE) IN NACL 2-0.9 UNIT/ML-% IJ SOLN
INTRAMUSCULAR | Status: AC
  Filled 2014-08-11: qty 500

## 2014-08-11 MED ORDER — SODIUM CHLORIDE 0.9 % IV SOLN
INTRAVENOUS | Status: DC
  Administered 2014-08-13: 20:00:00 10 mL/h via INTRAVENOUS

## 2014-08-11 MED ORDER — ATORVASTATIN CALCIUM 40 MG PO TABS
40.0000 mg | ORAL_TABLET | Freq: Every day | ORAL | Status: DC
  Administered 2014-08-11 – 2014-08-14 (×4): 40 mg via ORAL
  Filled 2014-08-11 (×7): qty 1

## 2014-08-11 MED ORDER — MORPHINE SULFATE 2 MG/ML IJ SOLN
INTRAMUSCULAR | Status: AC
  Filled 2014-08-11: qty 1

## 2014-08-11 MED ORDER — MORPHINE SULFATE 2 MG/ML IJ SOLN
1.0000 mg | INTRAMUSCULAR | Status: DC | PRN
  Administered 2014-08-11 – 2014-08-12 (×2): 1 mg via INTRAVENOUS
  Filled 2014-08-11: qty 1

## 2014-08-11 MED ORDER — LIDOCAINE HCL (PF) 1 % IJ SOLN
INTRAMUSCULAR | Status: AC
  Filled 2014-08-11: qty 30

## 2014-08-11 MED ORDER — ACETAMINOPHEN 325 MG PO TABS: 650.0000 mg | ORAL_TABLET | ORAL | Status: DC | PRN

## 2014-08-11 MED ORDER — HEPARIN (PORCINE) IN NACL 100-0.45 UNIT/ML-% IJ SOLN
1400.0000 [IU]/h | INTRAMUSCULAR | Status: DC
  Administered 2014-08-11: 1400 [IU]/h via INTRAVENOUS
  Administered 2014-08-11: 3000 [IU] via INTRAVENOUS
  Administered 2014-08-11 – 2014-08-13 (×2): 1400 [IU]/h via INTRAVENOUS
  Filled 2014-08-11 (×7): qty 250

## 2014-08-11 MED ORDER — FENTANYL CITRATE 0.05 MG/ML IJ SOLN
INTRAMUSCULAR | Status: AC
  Filled 2014-08-11: qty 2

## 2014-08-11 MED ORDER — FENTANYL CITRATE 0.05 MG/ML IJ SOLN
25.0000 ug | Freq: Once | INTRAMUSCULAR | Status: AC
  Administered 2014-08-11: 25 ug via INTRAVENOUS
  Filled 2014-08-11: qty 2

## 2014-08-11 MED ORDER — SODIUM CHLORIDE 0.9 % IV SOLN: INTRAVENOUS | Status: DC

## 2014-08-11 NOTE — H&P (Addendum)
CARDIOLOGY ADMISSION NOTE   Patient ID: Dean Murphy MRN: [id number], DOB/AGE: 02-10-1997   Admit date: 08/11/2014 Date of Consult: 08/11/2014  Primary Physician: Elsie Stain, MD Primary Cardiologist: Serita Grammes  Reason for consult:  Chest pain  Problem List  Past Medical History  Diagnosis Date  . Diabetes mellitus   . GERD (gastroesophageal reflux disease)   . Hyperlipidemia   . Hypertension   . Arthritis   . Diverticulosis   . Hemorrhoids   . Colon polyps   . CAD (coronary artery disease)   . Chronic kidney disease   . Osteoporosis   . Chronic kidney disease, stage IV (severe)     stage III/IV as of 2015    Past Surgical History  Procedure Laterality Date  . Coronary artery bypass graft    . Cholecystectomy    . Total knee arthroplasty      bilateral  . Tonsillectomy    . Carotid endarterectomy  12/31/10    Right  . Carotid endarterectomy  02/09/11    left  . Joint replacement      bilat. knees     Allergies  Allergies  Allergen Reactions  . Reclast [Zoledronic Acid]     Stopped 2015 due to Cr    HPI  A very pleasant 81 year old gentleman with prior medical history of diabetes, hypertension, hyperlipidemia and known coronary artery disease with first myocardial infarction at age 33, status post 5 vessel bypass in 32 at age of 89, s/p carotid endarterectomy in 1970, who presented today after 2 syncopal episodes. The patient states that for about 10 days he has felt profoundly short of breath and dizzy with any exertion, he states that after walking upstairs he would have to stop by and catch his breath. He woke up this morning at 2 AM as he couldn't catch his breath and while walking to the bathroom he fell twice. He states that he felt pain in between his shoulder plates and on the right side of his chest. This is still persistent and mild. He also describes profound nausea earlier this morning, now resolved and persistent shortness of breath.  The patient denies any fever, chills, cough, dysuria or diarrhea. No sick contacts in the last couple days. He denies any lower extremity edema and denies orthopnea and person nocturnal dyspnea until this last night.  The patient states that he hasn't seen cardiologist in 76 years his old cardiologist has retired and he has been just following with his primary care physician.   Inpatient Medications  Family History Family History  Problem Relation Age of Onset  . Diabetes    . Cancer    . Heart disease Mother   . Heart attack Father     Social History History   Social History  . Marital Status: Married    Spouse Name: N/A  . Number of Children: N/A  . Years of Education: N/A   Occupational History  . retired    Social History Main Topics  . Smoking status: Former Smoker -- 0.50 packs/day for 10 years    Types: Cigarettes    Quit date: 01/22/1971  . Smokeless tobacco: Never Used     Comment: smoked in high school  . Alcohol Use: 0.0 oz/week    0 Standard drinks or equivalent per week     Comment: 1-2 per week  . Drug Use: No  . Sexual Activity: Not on file   Other Topics Concern  . 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  Remarried 1998.     3 sons     Review of Systems  General:  No chills, fever, night sweats or weight changes.  Cardiovascular:  No chest pain, dyspnea on exertion, edema, orthopnea, palpitations, paroxysmal nocturnal dyspnea. Dermatological: No rash, lesions/masses Respiratory: No cough, dyspnea Urologic: No hematuria, dysuria Abdominal:   No nausea, vomiting, diarrhea, bright red blood per rectum, melena, or hematemesis Neurologic:  No visual changes, wkns, changes in mental status. All other systems reviewed and are otherwise negative except as noted above.  Physical Exam  Blood pressure 114/37, pulse 59, temperature 97.6 F (36.4 C), temperature source  Oral, resp. rate 18, height 5\' 10"  (1.778 m), weight 220 lb (99.791 kg), SpO2 93 %.  General: Pleasant, NAD Psych: Normal affect. Neuro: Alert and oriented X 3. Moves all extremities spontaneously. HEENT: Normal  Neck: Supple without bruits or JVD. Lungs:  Resp regular and unlabored, minimal crackles at bases. Heart: RRR no s3, s4, 2/6 systolic murmur. Abdomen: Soft, non-tender, non-distended, BS + x 4.  Extremities: No clubbing, cyanosis or edema. DP/PT/Radials 2+ and equal bilaterally.  Labs   Recent Labs  08/11/14 1142  TROPONINI 0.35*   Lab Results  Component Value Date   WBC 7.4 08/11/2014   HGB 11.4* 08/11/2014   HCT 35.3* 08/11/2014   MCV 88.5 08/11/2014   PLT 126* 08/11/2014    Recent Labs Lab 08/11/14 1142  NA 142  K 4.3  CL 111  CO2 21  BUN 38*  CREATININE 3.58*  CALCIUM 8.8  PROT 6.1  BILITOT 0.8  ALKPHOS 86  ALT 25  AST 27  GLUCOSE 192*   Lab Results  Component Value Date   CHOL 211* 03/12/2014   HDL 26.20* 03/12/2014   LDLCALC 83 09/23/2011   TRIG 243.0* 03/12/2014   No results found for: DDIMER Invalid input(s): POCBNP  Radiology/Studies  Dg Chest 2 View  08/11/2014   CLINICAL DATA:  Right upper chest pain that began around 0900 this AM; pt states he felt nauseous at the time the chest pain began, but he says it has gone away; pt states he has had 5 CABG, has diabetes and HTN; ex-smoker (quit 40 yrs ago)  EXAM: CHEST  2 VIEW  COMPARISON:  04/18/2012  FINDINGS: Changes from CABG surgery, stable. Cardiac silhouette normal in size and configuration. No mediastinal or hilar masses or evidence of adenopathy.  Mild bilateral irregular interstitial thickening is increased when compared to the prior study. There is mild thickening of the fissures and minimal effusions. No lung consolidation to suggest pneumonia. Lungs are mildly hyperexpanded. No pneumothorax.  Bony thorax is demineralized but grossly intact.  IMPRESSION: 1. Mild interstitial thickening  with minimal effusions. Findings are consistent with mild congestive heart failure if there shortness of breath. No evidence of pneumonia.   Electronically Signed   By: Lajean Manes M.D.   On: 08/11/2014 11:57   Echocardiogram - none  ECG: A-V dissociation, consistent with 3. AV block and ventricular response of 41 bpm.     ASSESSMENT AND PLAN  81 year old gentleman admitted after 2 syncopal episodes  1. A-V dissociation, consistent with 3. AV block and ventricular response of 41 bpm - the patient has symptomatic third-degree AV block, his first troponin is elevated at 0.35, however this is highly sensitive troponin with minimal elevation and he currently has  acute on chronic kidney failure with creatinine 3.58. He was not on any AVN blocking agents at home.   We will therefore hold cardiac And proceed with placement of temporary pacing wire.  2. Elevated troponin - minimally elevated high-sensitivity troponin, we will start heparin drip after the temporary wire is in place. Cath on hold For now as his crit creatinine is significantly elevated, we will continue to cycle troponins and decide about future management based on troponin levels and degree of kidney failure. The patient has known CAD, S/P CABG IN 1989, no recent testing, haven't seen cardiologist in years.  We will order echocardiogram.  - we will decrease home aspirin dose to 81 mg po daily.  - start atorvastatin 40 mg po daily.  - don't use betablockers (CHB) or ACEI (acute on chronic kidney failure)  3. HTN - controlled  4. Hyperlipidemia - we will start atorvastatin 40 mg po daily.   5. Acute on chronic kidney failure - most probably sec to hypoperfusion, we will follow.   6. Acute CHF - sec to CHB, we will avoid any diuretics with Acute on CKD, we will follow.   Signed, Dorothy Spark, MD, [facility] 08/11/2014, 4:11 PM

## 2014-08-11 NOTE — ED Notes (Signed)
Patient transported to X-ray 

## 2014-08-11 NOTE — ED Notes (Signed)
Transporting to cath lab at this time.

## 2014-08-11 NOTE — Interval H&P Note (Signed)
History and Physical Interval Note:  08/11/2014 5:19 PM  [name]  has presented today for surgery, with the diagnosis of STEMI  The various methods of treatment have been discussed with the patient and family. After consideration of risks, benefits and other options for treatment, the patient has consented to  Procedure(s): TEMPORARY PACEMAKER as a surgical intervention .  The patient's history has been reviewed, patient examined, no change in status, stable for surgery.  I have reviewed the patient's chart and labs.  Questions were answered to the patient's satisfaction.     [name]

## 2014-08-11 NOTE — Progress Notes (Signed)
eLink Physician-Brief Progress Note Patient Name: Dean Murphy DOB: 03-30-1927 MRN: [id number]   Date of Service  08/11/2014  HPI/Events of Note  81 yo male admitted to ICU on 08/11/2014 with syncopal episode d/t 3rd degree heart block. Now s/p cardiac cath lab and placement of transvenous pacemaker and pacer at a rate of 70. Management per cardiology. Patient is currently on a Heparin IV infusion.  eICU Interventions  Continue current management.     Intervention Category Evaluation Type: New Patient Evaluation  Lysle Dingwall 08/11/2014, 7:15 PM

## 2014-08-11 NOTE — ED Notes (Signed)
New onset of CP at 0800, 10/10, right chest, non-radiating, diaphoretic. Took 325mg  ASA, reduced to 7/10. Currently denies pain, reports a tightness. Reports yesterday had intermittent chest tightness while doing yard work. Irregular rhythm, new onset.

## 2014-08-11 NOTE — Progress Notes (Signed)
ANTICOAGULATION CONSULT NOTE - Initial Consult  Pharmacy Consult for Heparin Indication: chest pain/ACS  Allergies  Allergen Reactions  . Reclast [Zoledronic Acid]     Stopped 2015 due to Cr    Patient Measurements: Height: 5\' 10"  (177.8 cm) Weight: 220 lb (99.791 kg) IBW/kg (Calculated) : 73 Heparin Dosing Weight: 97 kg  Vital Signs: Temp: 97.6 F (36.4 C) (03/19 1119) Temp Source: Oral (03/19 1119) BP: 121/46 mmHg (03/19 1300) Pulse Rate: 49 (03/19 1300)  Labs:  Recent Labs  08/11/14 1142  HGB 11.4*  HCT 35.3*  PLT 126*  LABPROT 14.9  INR 1.16  CREATININE 3.58*  TROPONINI 0.35*    Estimated Creatinine Clearance: 17.9 mL/min (by C-G formula based on Cr of 3.58).   Medical History: Past Medical History  Diagnosis Date  . Diabetes mellitus   . GERD (gastroesophageal reflux disease)   . Hyperlipidemia   . Hypertension   . Arthritis   . Diverticulosis   . Hemorrhoids   . Colon polyps   . CAD (coronary artery disease)   . Chronic kidney disease   . Osteoporosis   . Chronic kidney disease, stage IV (severe)     stage III/IV as of 2015    Assessment: 81 year old with CKD to begin heparin for CP  Goal of Therapy:  Heparin level 0.3-0.7 units/ml Monitor platelets by anticoagulation protocol: Yes   Plan:  Heparin 3000 units iv bolus x 1 Heparin drip at 1400 units / hr Heparin level 8 hours after heparin starts Daily heparin level, CBC  Thank you. Anette Guarneri, PharmD [phone]  08/11/2014,1:56 PM

## 2014-08-11 NOTE — ED Notes (Signed)
Patient returned from X-ray 

## 2014-08-11 NOTE — CV Procedure (Signed)
Dean Murphy is a 81 y.o. male    [id number] LOCATION:  FACILITY: [facility]  PHYSICIAN: Quay Burow, M.D. 1926/07/30   DATE OF PROCEDURE:  08/11/2014  DATE OF DISCHARGE:     Temporary transvenous pacemaker insertion    History obtained from chart review.[name] has a history of ischemic heart disease status post remote coronary artery bypass grafting. He is felt short of breath and weak for a few days and had sick appeared this morning. He was seen in the emergency room by Dr. Meda Coffee and was found to be in complete heart block. I was asked to place a temporary transvenous pacemaker as a bridge to permanent pacemaker insertion   PROCEDURE DESCRIPTION:   The patient was brought to the second floor [facility] cath lab in the postabsorptive state. He was not premedicated . His right groinwas prepped and shaved in usual sterile fashion. Xylocaine 1% was used for local anesthesia. A 6 French sheath was inserted into the right common femoralVein using standard Seldinger technique. A 5 French balloon tip temperature is pacemaker was then advanced up the IVC through the right atrium into the RV apex. The balloon was deflated and thresholds obtained. The patient paced down to 2 MA. The pacer was set at a rate of 70, 5 MA. The sheath was sewn securely in place and secured. The patient left the lab in stable condition   HEMODYNAMICS:    AO SYSTOLIC/AO DIASTOLIC: 592/92 (blood pressure cuff)   IMPRESSION:successful implantation of a temporary transvenous pacemaker via the right common femoral vein approach with excellent capture for symptomatic complete heart block in anticipation of permanent transvenous pacemaker insertion on Monday. The patient tolerated the procedure well.  [name] MD, [facility] 08/11/2014 5:56 PM

## 2014-08-11 NOTE — ED Provider Notes (Addendum)
CSN: [id number]     Arrival date & time 08/11/14  1111 History   First MD Initiated Contact with Patient 08/11/14 1114     Chief Complaint  Patient presents with  . Chest Pain     (Consider location/radiation/quality/duration/timing/severity/associated sxs/prior Treatment) HPI Patient presents with new right-sided chest pain. Patient was at rest, when the pain began, and it was initially severe, though has eased substantially since that time. Pain is focally on the right upper chest, nonradiating. There is associated dyspnea. Patient was generally well prior to the onset. However, the patient notes that over the past 2 or 3 days he has had new exertional dyspnea. There has also been new exertional right-sided chest pain. Patient acknowledges recent weight gain, swelling in the lower extremities. He denies fever, chills, dyspnea, confusion, disorientation.  Past Medical History  Diagnosis Date  . Diabetes mellitus   . GERD (gastroesophageal reflux disease)   . Hyperlipidemia   . Hypertension   . Arthritis   . Diverticulosis   . Hemorrhoids   . Colon polyps   . CAD (coronary artery disease)   . Chronic kidney disease   . Osteoporosis   . Chronic kidney disease, stage IV (severe)     stage III/IV as of 2015   Past Surgical History  Procedure Laterality Date  . Coronary artery bypass graft    . Cholecystectomy    . Total knee arthroplasty      bilateral  . Tonsillectomy    . Carotid endarterectomy  12/31/10    Right  . Carotid endarterectomy  02/09/11    left  . Joint replacement      bilat. knees   Family History  Problem Relation Age of Onset  . Diabetes    . Cancer    . Heart disease Mother   . Heart attack Father    History  Substance Use Topics  . Smoking status: Former Smoker -- 0.50 packs/day for 10 years    Types: Cigarettes    Quit date: 01/22/1971  . Smokeless tobacco: Never Used     Comment: smoked in high school  . Alcohol Use: 0.0 oz/week    0  Standard drinks or equivalent per week     Comment: 1-2 per week    Review of Systems  Constitutional:       Per HPI, otherwise negative  HENT:       Per HPI, otherwise negative  Respiratory:       Per HPI, otherwise negative  Cardiovascular:       Per HPI, otherwise negative  Gastrointestinal: Negative for vomiting.  Endocrine:       Negative aside from HPI  Genitourinary:       Neg aside from HPI   Musculoskeletal:       Per HPI, otherwise negative  Skin: Negative.   Neurological: Negative for syncope.      Allergies  Reclast  Home Medications   Prior to Admission medications   Medication Sig Start Date End Date Taking? Authorizing Provider  AMITIZA 8 MCG capsule TAKE 1 CAPSULE DAILY WITH  BREAKFAST 01/31/14  Yes Tonia Ghent, MD  amLODipine (NORVASC) 10 MG tablet Take 1 tablet (10 mg total) by mouth daily. 04/25/14  Yes Tonia Ghent, MD  aspirin 325 MG tablet Take 325 mg by mouth daily.     Yes Historical Provider, MD  colchicine 0.6 MG tablet Take 1 tablet (0.6 mg total) by mouth 2 (two) times daily as needed.  As needed for gout attacks   Stop afterwards 02/23/14  Yes Venia Carbon, MD  febuxostat (ULORIC) 40 MG tablet Take 1 tablet (40 mg total) by mouth daily. 08/09/13  Yes Ricard Dillon, MD  furosemide (LASIX) 40 MG tablet Take 1 tablet (40 mg total) by mouth daily as needed. 03/15/14 03/15/15 Yes Tonia Ghent, MD  glipiZIDE (GLUCOTROL) 10 MG tablet Take 1 tablet (10 mg total) by mouth daily before breakfast. 08/09/13  Yes Ricard Dillon, MD  omeprazole (PRILOSEC) 20 MG capsule Take 1 capsule (20 mg total) by mouth daily. 12/01/13 03/12/15 Yes Tonia Ghent, MD  amoxicillin-clavulanate (AUGMENTIN) 875-125 MG per tablet Take 1 tablet by mouth 2 (two) times daily. 06/13/14   Tonia Ghent, MD  benazepril (LOTENSIN) 20 MG tablet Take 1 tablet (20 mg total) by mouth daily. 12/01/13   Tonia Ghent, MD  HYDROcodone-homatropine [facility]) 5-1.5 MG/5ML syrup Take  2.5-5 mLs by mouth every 8 (eight) hours as needed for cough (sedation caution). 06/13/14   Tonia Ghent, MD  NON FORMULARY daily. [employer] lancets an d glucometer strips    Historical Provider, MD   BP 137/56 mmHg  Pulse 74  Temp(Src) 97.6 F (36.4 C) (Oral)  Resp 23  Ht 5\' 10"  (1.778 m)  Wt 220 lb (99.791 kg)  BMI 31.57 kg/m2  SpO2 92% Physical Exam  Constitutional: He is oriented to person, place, and time. He appears well-developed. No distress.  HENT:  Head: Normocephalic and atraumatic.  Eyes: Conjunctivae and EOM are normal.  Cardiovascular: An irregular rhythm present. Bradycardia present.   Pulmonary/Chest: Effort normal. No stridor. No respiratory distress.  Abdominal: He exhibits no distension.  Musculoskeletal: He exhibits no edema or tenderness.  Neurological: He is alert and oriented to person, place, and time.  Skin: Skin is warm and dry.  Psychiatric: He has a normal mood and affect.  Nursing note and vitals reviewed.   ED Course  Procedures (including critical care time) Labs Review Labs Reviewed  CBC - Abnormal; Notable for the following:    RBC 3.99 (*)    Hemoglobin 11.4 (*)    HCT 35.3 (*)    Platelets 126 (*)    All other components within normal limits  TROPONIN I - Abnormal; Notable for the following:    Troponin I 0.35 (*)    All other components within normal limits  COMPREHENSIVE METABOLIC PANEL - Abnormal; Notable for the following:    Glucose, Bld 192 (*)    BUN 38 (*)    Creatinine, Ser 3.58 (*)    GFR calc non Af Amer 14 (*)    GFR calc Af Amer 16 (*)    All other components within normal limits  PROTIME-INR  BRAIN NATRIURETIC PEPTIDE  HEPARIN LEVEL (UNFRACTIONATED)    Imaging Review Dg Chest 2 View  08/11/2014   CLINICAL DATA:  Right upper chest pain that began around 0900 this AM; pt states he felt nauseous at the time the chest pain began, but he says it has gone away; pt states he has had 5 CABG, has diabetes and HTN;  ex-smoker (quit 40 yrs ago)  EXAM: CHEST  2 VIEW  COMPARISON:  04/18/2012  FINDINGS: Changes from CABG surgery, stable. Cardiac silhouette normal in size and configuration. No mediastinal or hilar masses or evidence of adenopathy.  Mild bilateral irregular interstitial thickening is increased when compared to the prior study. There is mild thickening of the fissures and minimal effusions. No  lung consolidation to suggest pneumonia. Lungs are mildly hyperexpanded. No pneumothorax.  Bony thorax is demineralized but grossly intact.  IMPRESSION: 1. Mild interstitial thickening with minimal effusions. Findings are consistent with mild congestive heart failure if there shortness of breath. No evidence of pneumonia.   Electronically Signed   By: Lajean Manes M.D.   On: 08/11/2014 11:57     EKG Interpretation   Date/Time:  Saturday August 11 2014 11:13:14 EDT Ventricular Rate:  41 PR Interval:  554 QRS Duration: 99 QT Interval:  434 QTC Calculation: 358 R Axis:   67 Text Interpretation:  Predominant 2:1 AV block Atrial premature complexes  Abnrm T, consider ischemia, anterolateral lds Sinus rhythm Premature  atrial complexes T wave abnormality Abnormal ekg Confirmed by Carmin Muskrat  MD ([id number]) on 08/11/2014 11:24:22 AM     12:54 PM I discussed patient's case with our cardiology colleagues. On repeat exam the patient's heart rate is in the 30s, though it improves into the 40s when he is talking. Rate remains irregular.  Chart review is noncontributory.   Update: Patient has reduction in his pain following fentanyl. Heart rate in the 40s. Initial labs notable for elevated troponin, elevated creatinine.  On discussion with patient and his family, family states the patient does not have recent heart evaluation.  Patient's pain has resolved.  Given the patient's elevated troponin, heparin drip has been started, I discussed this case with our cardiologist again.  MDM   Patient presents with  chest pain, after several days of new exertional dyspnea. Here the patient is awake and alert, though he remains bradycardic, with irregular heart rhythm. Patient has a history of CABG but no recent heart evaluation. Patient also has history of chronic kidney disease. Today's evaluation is notable for demonstration of persistently irregular heart rhythm, with sinus rhythm, second-degree block, and PACs all demonstrated on cardiac rhythm. Patient elevated troponin, elevated creatinine, and with his description of new dyspnea, chest pain, there is some concern for unstable angina. Patient had resolution of his pain with fentanyl. Patient was started on a heparin drip. Patient was admitted for further evaluation and management.  CRITICAL CARE Performed by: Carmin Muskrat Total critical care time: 40 Critical care time was exclusive of separately billable procedures and treating other patients. Critical care was necessary to treat or prevent imminent or life-threatening deterioration. Critical care was time spent personally by me on the following activities: development of treatment plan with patient and/or surrogate as well as nursing, discussions with consultants, evaluation of patient's response to treatment, examination of patient, obtaining history from patient or surrogate, ordering and performing treatments and interventions, ordering and review of laboratory studies, ordering and review of radiographic studies, pulse oximetry and re-evaluation of patient's condition.     Carmin Muskrat, MD 08/11/14 1545   4:37 PM I again discussed the patient's case w cardiology. With intermittent arrhythmia, including 3rd degree block episodes, he will receive pacer, be admitted to CCU.  Carmin Muskrat, MD 08/11/14 [phone]

## 2014-08-11 NOTE — Consult Note (Addendum)
Entered in error

## 2014-08-12 DIAGNOSIS — R0789 Other chest pain: Secondary | ICD-10-CM

## 2014-08-12 LAB — BLOOD GAS, ARTERIAL
Acid-base deficit: 1.3 mmol/L (ref 0.0–2.0)
Bicarbonate: 23.2 mEq/L (ref 20.0–24.0)
DRAWN BY: 319961
O2 Content: 6 L/min
O2 SAT: 89 %
PH ART: 7.374 (ref 7.350–7.450)
Patient temperature: 98.6
TCO2: 24.5 mmol/L (ref 0–100)
pCO2 arterial: 40.7 mmHg (ref 35.0–45.0)
pO2, Arterial: 60.1 mmHg — ABNORMAL LOW (ref 80.0–100.0)

## 2014-08-12 LAB — BASIC METABOLIC PANEL
Anion gap: 5 (ref 5–15)
BUN: 42 mg/dL — ABNORMAL HIGH (ref 6–23)
CO2: 25 mmol/L (ref 19–32)
Calcium: 8.4 mg/dL (ref 8.4–10.5)
Chloride: 112 mmol/L (ref 96–112)
Creatinine, Ser: 2.93 mg/dL — ABNORMAL HIGH (ref 0.50–1.35)
GFR calc Af Amer: 21 mL/min — ABNORMAL LOW (ref >90)
GFR calc non Af Amer: 18 mL/min — ABNORMAL LOW (ref >90)
Glucose, Bld: 185 mg/dL — ABNORMAL HIGH (ref 70–99)
Potassium: 4.5 mmol/L (ref 3.5–5.1)
Sodium: 142 mmol/L (ref 135–145)

## 2014-08-12 LAB — CBC
HCT: 35.2 % — ABNORMAL LOW (ref 39.0–52.0)
HEMOGLOBIN: 11.2 g/dL — AB (ref 13.0–17.0)
MCH: 28.4 pg (ref 26.0–34.0)
MCHC: 31.8 g/dL (ref 30.0–36.0)
MCV: 89.1 fL (ref 78.0–100.0)
PLATELETS: 138 10*3/uL — AB (ref 150–400)
RBC: 3.95 MIL/uL — ABNORMAL LOW (ref 4.22–5.81)
RDW: 14 % (ref 11.5–15.5)
WBC: 11.2 10*3/uL — ABNORMAL HIGH (ref 4.0–10.5)

## 2014-08-12 LAB — TROPONIN I
Troponin I: 0.54 ng/mL (ref <0.031)
Troponin I: 0.54 ng/mL (ref <0.031)
Troponin I: 0.53 ng/mL (ref <0.031)

## 2014-08-12 LAB — HEPARIN LEVEL (UNFRACTIONATED)
Heparin Unfractionated: 0.47 IU/mL (ref 0.30–0.70)
Heparin Unfractionated: 0.39 IU/mL (ref 0.30–0.70)

## 2014-08-12 LAB — D-DIMER, QUANTITATIVE (NOT AT ARMC): D DIMER QUANT: 0.69 ug{FEU}/mL — AB (ref 0.00–0.48)

## 2014-08-12 MED ORDER — FUROSEMIDE 10 MG/ML IJ SOLN
40.0000 mg | Freq: Once | INTRAMUSCULAR | Status: AC
  Administered 2014-08-12: 40 mg via INTRAVENOUS
  Filled 2014-08-12: qty 4

## 2014-08-12 MED ORDER — CETYLPYRIDINIUM CHLORIDE 0.05 % MT LIQD: 7.0000 mL | Freq: Two times a day (BID) | OROMUCOSAL | Status: DC

## 2014-08-12 NOTE — Progress Notes (Signed)
UR Completed.  [phone]  

## 2014-08-12 NOTE — Progress Notes (Signed)
Pt with continued oxygen desaturations intermittently with nasal cannula 6L; lungs CTA bilaterally; no complaints of SHOB currently; MD aware; orders received for labwork; will continue to monitor

## 2014-08-12 NOTE — Progress Notes (Addendum)
[place] for Heparin Indication: chest pain/ACS  Allergies  Allergen Reactions  . Reclast [Zoledronic Acid]     Stopped 2015 due to Cr    Patient Measurements: Height: 5\' 8"  (172.7 cm) Weight: 215 lb 13.3 oz (97.9 kg) IBW/kg (Calculated) : 68.4 Heparin Dosing Weight: 97 kg  Vital Signs: Temp: 98.2 F (36.8 C) (03/19 1930) Temp Source: Oral (03/19 1930) BP: 140/46 mmHg (03/19 2300) Pulse Rate: 68 (03/19 2300)  Labs:  Recent Labs  08/11/14 1142 08/11/14 2355  HGB 11.4*  --   HCT 35.3*  --   PLT 126*  --   LABPROT 14.9  --   INR 1.16  --   HEPARINUNFRC  --  0.47  CREATININE 3.58*  --   TROPONINI 0.35*  --     Estimated Creatinine Clearance: 17.1 mL/min (by C-G formula based on Cr of 3.58).  Assessment: 81 y.o. male with chest pain for heparin   Goal of Therapy:  Heparin level 0.3-0.7 units/ml Monitor platelets by anticoagulation protocol: Yes   Plan:  Continue Heparin at current rate  Follow-up am labs.   Phillis Knack, PharmD, BCPS  08/12/2014,1:05 AM   ADDENDUM: AM HL remains therapeutic at 0.39.   Plan: Continue heparin gtt at 1400 units/hr Monitor daily HL, CBC, s/s of bleed

## 2014-08-12 NOTE — Progress Notes (Addendum)
Patient Name: Dean Murphy Date of Encounter: 08/12/2014  Active Problems:   Chest pain   Complete heart block   NSTEMI (non-ST elevated myocardial infarction)   Acute renal failure superimposed on stage 3 chronic kidney disease   Hypertension   Hyperlipidemia   S/P CABG x 5   Syncope   CHB (complete heart block)   Length of Stay: 1  SUBJECTIVE  The patient feels significantly better today, improved SOB, but still persistent.   CURRENT MEDS . aspirin  81 mg Oral Daily  . atorvastatin  40 mg Oral q1800    OBJECTIVE  Filed Vitals:   08/12/14 0400 08/12/14 0500 08/12/14 0600 08/12/14 0700  BP: 119/43 119/46 123/43 109/44  Pulse: 69 73 70 69  Temp: 98.2 F (36.8 C)     TempSrc: Oral     Resp: 22 22 21 20   Height:      Weight:      SpO2: 94% 90% 92% 93%    Intake/Output Summary (Last 24 hours) at 08/12/14 1004 Last data filed at 08/12/14 0700  Gross per 24 hour  Intake    725 ml  Output    300 ml  Net    425 ml   Filed Weights   08/11/14 1119 08/11/14 1830  Weight: 220 lb (99.791 kg) 215 lb 13.3 oz (97.9 kg)    PHYSICAL EXAM  General: Pleasant, NAD. Neuro: Alert and oriented X 3. Moves all extremities spontaneously. Psych: Normal affect. HEENT:  Normal  Neck: Supple without bruits or JVD. Lungs:  Resp regular and unlabored, crackles at the bases. Heart: RRR no s3, s4, or murmurs. Abdomen: Soft, non-tender, non-distended, BS + x 4.  Extremities: No clubbing, cyanosis or edema. DP/PT/Radials 2+ and equal bilaterally.  Accessory Clinical Findings  CBC  Recent Labs  08/11/14 1142 08/12/14 0353  WBC 7.4 11.2*  HGB 11.4* 11.2*  HCT 35.3* 35.2*  MCV 88.5 89.1  PLT 126* 503*   Basic Metabolic Panel  Recent Labs  08/11/14 1142 08/12/14 0353  NA 142 142  K 4.3 4.5  CL 111 112  CO2 21 25  GLUCOSE 192* 185*  BUN 38* 42*  CREATININE 3.58* 2.93*  CALCIUM 8.8 8.4   Liver Function Tests  Recent Labs  08/11/14 1142  AST 27  ALT 25    ALKPHOS 86  BILITOT 0.8  PROT 6.1  ALBUMIN 3.5   Cardiac Enzymes  Recent Labs  08/11/14 1142  TROPONINI 0.35*   Radiology/Studies  Dg Chest 2 View  08/11/2014   CLINICAL DATA:  Right upper chest pain that began around 0900 this AM; pt states he felt nauseous at the time the chest pain began, but he says it has gone away; pt states he has had 5 CABG, has diabetes and HTN; ex-smoker (quit 40 yrs ago)   IMPRESSION: 1. Mild interstitial thickening with minimal effusions. Findings are consistent with mild congestive heart failure if there shortness of breath. No evidence of pneumonia.    TELE: V paced rhythm      ASSESSMENT AND PLAN  81 year old gentleman admitted after 2 syncopal episodes  1. A-V dissociation, consistent with 3. AV block and ventricular response of 41 bpm - the patient has symptomatic third-degree AV block, his first troponin is elevated at 0.35, however this is highly sensitive troponin with minimal elevation and he currently has acute on chronic kidney failure with creatinine 3.58. He was not on any AVN blocking agents at home.  The  patient underwent placement of a temporary pacemaker yesterday. We will repeat troponin - if continues to rise, we will consider to wait until Crea normalizes for a possible cath, if Troponin down or flat, we will schedule for a permanent pacer tomorrow.   2. Elevated troponin - minimally elevated high-sensitivity troponin, we will start heparin drip after the temporary wire is in place. Cath on hold For now as his crit creatinine is significantly elevated, we will continue to cycle troponins and decide about Dean management based on troponin levels and degree of kidney failure. The patient has known CAD, S/P CABG IN 1989, no recent testing, haven't seen cardiologist in years.  We will order echocardiogram.  - we will decrease home aspirin dose to 81 mg po daily.  - start atorvastatin 40 mg po daily.  - don't use betablockers  (CHB) or ACEI (acute on chronic kidney failure)  3. HTN - controlled  4. Hyperlipidemia - we will start atorvastatin 40 mg po daily.   5. Acute on chronic kidney failure - most probably sec to hypoperfusion, improving 3.6-->2.9, baseline 1.9-3.0.   6. Acute CHF - sec to CHB, we will give just 1 dose of diuretics as he has ARI, however desaturates just talking.   Signed, Dorothy Spark MD, [facility] 08/12/2014

## 2014-08-13 ENCOUNTER — Encounter (HOSPITAL_COMMUNITY)
Admission: EM | Disposition: A | Discharge status: Home / Self Care | Payer: Self-pay | Source: Home / Self Care | Attending: Cardiovascular Disease

## 2014-08-13 ENCOUNTER — Encounter (HOSPITAL_COMMUNITY): Payer: Self-pay | Admitting: Cardiology

## 2014-08-13 DIAGNOSIS — Z950 Presence of cardiac pacemaker: Secondary | ICD-10-CM

## 2014-08-13 DIAGNOSIS — I442 Atrioventricular block, complete: Secondary | ICD-10-CM

## 2014-08-13 DIAGNOSIS — I459 Conduction disorder, unspecified: Secondary | ICD-10-CM

## 2014-08-13 DIAGNOSIS — I4891 Unspecified atrial fibrillation: Secondary | ICD-10-CM | POA: Diagnosis present

## 2014-08-13 DIAGNOSIS — I480 Paroxysmal atrial fibrillation: Secondary | ICD-10-CM | POA: Diagnosis present

## 2014-08-13 HISTORY — PX: PERMANENT PACEMAKER INSERTION: SHX5480

## 2014-08-13 HISTORY — DX: Presence of cardiac pacemaker: Z95.0

## 2014-08-13 LAB — CBC
HCT: 33.7 % — ABNORMAL LOW (ref 39.0–52.0)
HEMOGLOBIN: 10.7 g/dL — AB (ref 13.0–17.0)
MCH: 28.1 pg (ref 26.0–34.0)
MCHC: 31.8 g/dL (ref 30.0–36.0)
MCV: 88.5 fL (ref 78.0–100.0)
PLATELETS: 125 10*3/uL — AB (ref 150–400)
RBC: 3.81 MIL/uL — ABNORMAL LOW (ref 4.22–5.81)
RDW: 13.7 % (ref 11.5–15.5)
WBC: 7.7 10*3/uL (ref 4.0–10.5)

## 2014-08-13 LAB — HEPARIN LEVEL (UNFRACTIONATED): Heparin Unfractionated: 0.31 IU/mL (ref 0.30–0.70)

## 2014-08-13 LAB — BASIC METABOLIC PANEL
Anion gap: 6 (ref 5–15)
BUN: 37 mg/dL — ABNORMAL HIGH (ref 6–23)
CO2: 25 mmol/L (ref 19–32)
Calcium: 8.2 mg/dL — ABNORMAL LOW (ref 8.4–10.5)
Chloride: 111 mmol/L (ref 96–112)
Creatinine, Ser: 2.01 mg/dL — ABNORMAL HIGH (ref 0.50–1.35)
GFR calc Af Amer: 33 mL/min — ABNORMAL LOW (ref >90)
GFR calc non Af Amer: 29 mL/min — ABNORMAL LOW (ref >90)
Glucose, Bld: 165 mg/dL — ABNORMAL HIGH (ref 70–99)
Potassium: 4.2 mmol/L (ref 3.5–5.1)
Sodium: 142 mmol/L (ref 135–145)

## 2014-08-13 LAB — GLUCOSE, CAPILLARY: Glucose-Capillary: 127 mg/dL — ABNORMAL HIGH (ref 70–99)

## 2014-08-13 LAB — TSH: TSH: 1.582 u[IU]/mL (ref 0.350–4.500)

## 2014-08-13 SURGERY — PERMANENT PACEMAKER INSERTION

## 2014-08-13 MED ORDER — PANTOPRAZOLE SODIUM 40 MG PO TBEC
40.0000 mg | DELAYED_RELEASE_TABLET | Freq: Every day | ORAL | Status: DC
  Administered 2014-08-14 – 2014-08-15 (×2): 40 mg via ORAL
  Filled 2014-08-13 (×2): qty 1

## 2014-08-13 MED ORDER — AMLODIPINE BESYLATE 10 MG PO TABS
10.0000 mg | ORAL_TABLET | Freq: Every day | ORAL | Status: DC
  Administered 2014-08-13 – 2014-08-15 (×3): 10 mg via ORAL
  Filled 2014-08-13 (×3): qty 1

## 2014-08-13 MED ORDER — MIDAZOLAM HCL 5 MG/5ML IJ SOLN
INTRAMUSCULAR | Status: AC
  Filled 2014-08-13: qty 5

## 2014-08-13 MED ORDER — SODIUM CHLORIDE 0.9 % IV SOLN: 250.0000 mL | INTRAVENOUS | Status: DC

## 2014-08-13 MED ORDER — SODIUM CHLORIDE 0.9 % IJ SOLN
3.0000 mL | Freq: Two times a day (BID) | INTRAMUSCULAR | Status: DC
  Administered 2014-08-13: 3 mL via INTRAVENOUS

## 2014-08-13 MED ORDER — ASPIRIN 325 MG PO TABS: 325.0000 mg | ORAL_TABLET | Freq: Every day | ORAL | Status: DC

## 2014-08-13 MED ORDER — SODIUM CHLORIDE 0.9 % IV SOLN: INTRAVENOUS | Status: DC

## 2014-08-13 MED ORDER — FUROSEMIDE 40 MG PO TABS
40.0000 mg | ORAL_TABLET | Freq: Every day | ORAL | Status: DC
  Administered 2014-08-14 – 2014-08-15 (×2): 40 mg via ORAL
  Filled 2014-08-13 (×2): qty 1

## 2014-08-13 MED ORDER — FEBUXOSTAT 40 MG PO TABS
40.0000 mg | ORAL_TABLET | Freq: Every day | ORAL | Status: DC
  Administered 2014-08-13 – 2014-08-15 (×3): 40 mg via ORAL
  Filled 2014-08-13 (×3): qty 1

## 2014-08-13 MED ORDER — MAGNESIUM HYDROXIDE 400 MG/5ML PO SUSP
30.0000 mL | Freq: Every day | ORAL | Status: DC | PRN
Start: 2014-08-13 — End: 2014-08-15

## 2014-08-13 MED ORDER — CHLORHEXIDINE GLUCONATE 4 % EX LIQD
60.0000 mL | Freq: Once | CUTANEOUS | Status: DC
Start: 2014-08-13 — End: 2014-08-13
  Filled 2014-08-13: qty 60

## 2014-08-13 MED ORDER — LIDOCAINE HCL (PF) 1 % IJ SOLN
INTRAMUSCULAR | Status: AC
  Filled 2014-08-13: qty 60

## 2014-08-13 MED ORDER — FENTANYL CITRATE 0.05 MG/ML IJ SOLN
INTRAMUSCULAR | Status: AC
  Filled 2014-08-13: qty 2

## 2014-08-13 MED ORDER — GLIPIZIDE 10 MG PO TABS
10.0000 mg | ORAL_TABLET | Freq: Every day | ORAL | Status: DC
  Administered 2014-08-14 – 2014-08-15 (×2): 10 mg via ORAL
  Filled 2014-08-13 (×3): qty 1

## 2014-08-13 MED ORDER — CHLORHEXIDINE GLUCONATE 4 % EX LIQD
60.0000 mL | Freq: Once | CUTANEOUS | Status: AC
  Administered 2014-08-13: 4 via TOPICAL
  Filled 2014-08-13: qty 30

## 2014-08-13 MED ORDER — ONDANSETRON HCL 4 MG/2ML IJ SOLN: 4.0000 mg | Freq: Four times a day (QID) | INTRAMUSCULAR | Status: DC | PRN

## 2014-08-13 MED ORDER — CEFAZOLIN SODIUM 1-5 GM-% IV SOLN
1.0000 g | Freq: Four times a day (QID) | INTRAVENOUS | Status: AC
  Administered 2014-08-13 – 2014-08-14 (×3): 1 g via INTRAVENOUS
  Filled 2014-08-13 (×3): qty 50

## 2014-08-13 MED ORDER — SODIUM CHLORIDE 0.9 % IR SOLN
80.0000 mg | Status: DC
  Filled 2014-08-13 (×2): qty 2

## 2014-08-13 MED ORDER — SODIUM CHLORIDE 0.9 % IJ SOLN: 3.0000 mL | INTRAMUSCULAR | Status: DC | PRN

## 2014-08-13 MED ORDER — LIDOCAINE HCL (PF) 1 % IJ SOLN
INTRAMUSCULAR | Status: AC
  Filled 2014-08-13: qty 30

## 2014-08-13 MED ORDER — CEFAZOLIN SODIUM-DEXTROSE 2-3 GM-% IV SOLR
2.0000 g | INTRAVENOUS | Status: DC
  Filled 2014-08-13: qty 50

## 2014-08-13 MED ORDER — YOU HAVE A PACEMAKER BOOK
Freq: Once | Status: AC
  Administered 2014-08-14: 01:00:00
  Filled 2014-08-13: qty 1

## 2014-08-13 MED ORDER — ACETAMINOPHEN 325 MG PO TABS: 325.0000 mg | ORAL_TABLET | ORAL | Status: DC | PRN

## 2014-08-13 NOTE — Progress Notes (Signed)
[place] for Heparin Indication: chest pain/ACS  Allergies  Allergen Reactions  . Reclast [Zoledronic Acid]     Stopped 2015 due to Cr    Patient Measurements: Height: 5\' 8"  (172.7 cm) Weight: 215 lb 13.3 oz (97.9 kg) IBW/kg (Calculated) : 68.4 Heparin Dosing Weight: 97 kg  Vital Signs: Temp: 98.4 F (36.9 C) (03/21 0831) Temp Source: Oral (03/21 0831) BP: 116/46 mmHg (03/21 0900) Pulse Rate: 63 (03/21 0900)  Labs:  Recent Labs  08/11/14 1142 08/11/14 2355 08/12/14 0353 08/12/14 1300 08/12/14 1613 08/12/14 2205 08/13/14 0311  HGB 11.4*  --  11.2*  --   --   --  10.7*  HCT 35.3*  --  35.2*  --   --   --  33.7*  PLT 126*  --  138*  --   --   --  125*  LABPROT 14.9  --   --   --   --   --   --   INR 1.16  --   --   --   --   --   --   HEPARINUNFRC  --  0.47 0.39  --   --   --  0.31  CREATININE 3.58*  --  2.93*  --   --   --   --   TROPONINI 0.35*  --   --  0.54* 0.54* 0.53*  --     Estimated Creatinine Clearance: 20.9 mL/min (by C-G formula based on Cr of 2.93).  Assessment: 81 y.o. male with chest pain started on IV heparin. Patient appears in afib with slow VR this morning. Heparin was at low end of goal with am labs, infusion is now off for permanent pacemaker to be placed later today. No bleeding issues noted overnight, hgb stable. Will follow up this afternoon post-pm for possible anticoagulation plans.   Goal of Therapy:  Heparin level 0.3-0.7 units/ml Monitor platelets by anticoagulation protocol: Yes   Plan:  Heparin currently on hold for permanent pacemaker this afternoon Follow up this evening  Erin Hearing PharmD., BCPS Clinical Pharmacist Pager [phone] 08/13/2014 10:33 AM

## 2014-08-13 NOTE — Progress Notes (Signed)
Right femoral venous sheath removed. Pressure held for 10 minutes with hemostasis obtained. No pain at groin. Positive distal pulses. Site level 0. Bedrest begins at 1730.

## 2014-08-13 NOTE — Consult Note (Addendum)
Reason for Consult:   CHB, syncope  Requesting Physician: Dr Meda Coffee  HPI: This is a 81 y.o.Rt handed male with a history of CAD, s/p CABG 1989. He was followed by Dr Rollene Fare but has not seen a cardiologist in the last few years. Myoview in 2012 was low risk (EF 75% then). He has had increasing DOE for the past few weeks. On 3/18 he said he to sit down in the grass while taking his trash can to the street. On 3/19 he woke up SOB and had chest pain that radiated to his back between his shoulders. EKG on admission showed 3 degree AVB with a rate of 41. He also had mild CHF on CXR and acute on chronic renal insufficiency with a SCr of 3.58 (baseline is 1.5-2.0). A T-TVDP was placed and the pt improved symptomatically. He diuresed 700 cc after one dose of Lasix 40 mg yesterday. Last night he says he slept well (flat in bed). He denies further chest pain. Troponin peak was 0.54. Today's lab is pending, but his SCr had improved to 2.93 yesterday. His pacemaker was turned down this am and he appears to be in AF with slow VR.   PMHx:  Past Medical History  Diagnosis Date  . Diabetes mellitus   . GERD (gastroesophageal reflux disease)   . Hyperlipidemia   . Hypertension   . Arthritis   . Diverticulosis   . Hemorrhoids   . Colon polyps   . CAD (coronary artery disease)   . Chronic kidney disease   . Osteoporosis   . Chronic kidney disease, stage IV (severe)     stage III/IV as of 2015    Past Surgical History  Procedure Laterality Date  . Coronary artery bypass graft    . Cholecystectomy    . Total knee arthroplasty      bilateral  . Tonsillectomy    . Carotid endarterectomy  12/31/10    Right  . Carotid endarterectomy  02/09/11    left  . Joint replacement      bilat. knees  . Cardiac catheterization  08/11/2014    Procedure: TEMPORARY PACEMAKER;  Surgeon: [name], MD;  Location: [facility] CATH LAB;  Service: Cardiovascular;;    SOCHx:  reports that he quit smoking  about 43 years ago. His smoking use included Cigarettes. He has a 5 pack-year smoking history. He has never used smokeless tobacco. He reports that he drinks alcohol. He reports that he does not use illicit drugs.  FAMHx: Family History  Problem Relation Age of Onset  . Diabetes    . Cancer    . Heart disease Mother   . Heart attack Father     ALLERGIES: Allergies  Allergen Reactions  . Reclast [Zoledronic Acid]     Stopped 2015 due to Cr    ROS: Pertinent items are noted in HPI. See H&P for complete ROS. He has noted LE edema over the past few weeks.  LEA dopplers in 2014- ABI 0.93 on Rt 1.0 on Lt   HOME MEDICATIONS: Prior to Admission medications   Medication Sig Start Date End Date Taking? Authorizing Provider  AMITIZA 8 MCG capsule TAKE 1 CAPSULE DAILY WITH  BREAKFAST 01/31/14  Yes Tonia Ghent, MD  amLODipine (NORVASC) 10 MG tablet Take 1 tablet (10 mg total) by mouth daily. 04/25/14  Yes Tonia Ghent, MD  aspirin 325 MG tablet Take 325 mg by mouth daily.  Yes Historical Provider, MD  colchicine 0.6 MG tablet Take 1 tablet (0.6 mg total) by mouth 2 (two) times daily as needed. As needed for gout attacks   Stop afterwards 02/23/14  Yes Venia Carbon, MD  febuxostat (ULORIC) 40 MG tablet Take 1 tablet (40 mg total) by mouth daily. 08/09/13  Yes Ricard Dillon, MD  furosemide (LASIX) 40 MG tablet Take 1 tablet (40 mg total) by mouth daily as needed. 03/15/14 03/15/15 Yes Tonia Ghent, MD  glipiZIDE (GLUCOTROL) 10 MG tablet Take 1 tablet (10 mg total) by mouth daily before breakfast. 08/09/13  Yes Ricard Dillon, MD  NON FORMULARY daily. careconcepts lancets an d glucometer strips   Yes Historical Provider, MD  omeprazole (PRILOSEC) 20 MG capsule Take 1 capsule (20 mg total) by mouth daily. 12/01/13 03/12/15 Yes Tonia Ghent, MD  amoxicillin-clavulanate (AUGMENTIN) 875-125 MG per tablet Take 1 tablet by mouth 2 (two) times daily. Patient not taking: Reported on  08/11/2014 06/13/14   Tonia Ghent, MD  benazepril (LOTENSIN) 20 MG tablet Take 1 tablet (20 mg total) by mouth daily. Patient not taking: Reported on 08/11/2014 12/01/13   Tonia Ghent, MD  HYDROcodone-homatropine [facility]) 5-1.5 MG/5ML syrup Take 2.5-5 mLs by mouth every 8 (eight) hours as needed for cough (sedation caution). Patient not taking: Reported on 08/11/2014 06/13/14   Tonia Ghent, MD    HOSPITAL MEDICATIONS: I have reviewed the patient's current medications.  VITALS: Blood pressure 117/48, pulse 70, temperature 98.4 F (36.9 C), temperature source Oral, resp. rate 20, height 5\' 8"  (1.727 m), weight 215 lb 13.3 oz (97.9 kg), SpO2 94 %.  PHYSICAL EXAM: General appearance: alert, cooperative and no distress Neck: no JVD and bilateral CEA scar with Rt CA bruit Lungs: decreased, no rales or wheezing Heart: regular rate and rhythm Abdomen: soft, non-tender; bowel sounds normal; no masses,  no organomegaly Extremities: extremities normal, atraumatic, no cyanosis or edema Pulses: diminnished LE pulses Skin: Skin color, texture, turgor normal. No rashes or lesions Neurologic: Grossly normal  LABS: Results for orders placed or performed during the hospital encounter of 08/11/14 (from the past 24 hour(s))  Troponin I     Status: Abnormal   Collection Time: 08/12/14  1:00 PM  Result Value Ref Range   Troponin I 0.54 (HH) <0.031 ng/mL  Blood gas, arterial     Status: Abnormal   Collection Time: 08/12/14  2:48 PM  Result Value Ref Range   O2 Content 6.0 L/min   Delivery systems NASAL CANNULA    pH, Arterial 7.374 7.350 - 7.450   pCO2 arterial 40.7 35.0 - 45.0 mmHg   pO2, Arterial 60.1 (L) 80.0 - 100.0 mmHg   Bicarbonate 23.2 20.0 - 24.0 mEq/L   TCO2 24.5 0 - 100 mmol/L   Acid-base deficit 1.3 0.0 - 2.0 mmol/L   O2 Saturation 89.0 %   Patient temperature 98.6    Collection site RIGHT RADIAL    Drawn by [phone]    Sample type ARTERIAL DRAW    Allens test (pass/fail)  PASS PASS  Troponin I     Status: Abnormal   Collection Time: 08/12/14  4:13 PM  Result Value Ref Range   Troponin I 0.54 (HH) <0.031 ng/mL  Troponin I     Status: Abnormal   Collection Time: 08/12/14 10:05 PM  Result Value Ref Range   Troponin I 0.53 (HH) <0.031 ng/mL  D-dimer, quantitative     Status: Abnormal   Collection  Time: 08/12/14 10:05 PM  Result Value Ref Range   D-Dimer, Quant 0.69 (H) 0.00 - 0.48 ug/mL-FEU  Heparin level (unfractionated)     Status: None   Collection Time: 08/13/14  3:11 AM  Result Value Ref Range   Heparin Unfractionated 0.31 0.30 - 0.70 IU/mL  CBC     Status: Abnormal   Collection Time: 08/13/14  3:11 AM  Result Value Ref Range   WBC 7.7 4.0 - 10.5 K/uL   RBC 3.81 (L) 4.22 - 5.81 MIL/uL   Hemoglobin 10.7 (L) 13.0 - 17.0 g/dL   HCT 33.7 (L) 39.0 - 52.0 %   MCV 88.5 78.0 - 100.0 fL   MCH 28.1 26.0 - 34.0 pg   MCHC 31.8 30.0 - 36.0 g/dL   RDW 13.7 11.5 - 15.5 %   Platelets 125 (L) 150 - 400 K/uL    EKG:  Paced with underlying AF  IMAGING: Dg Chest 2 View  08/11/2014   CLINICAL DATA:  Right upper chest pain that began around 0900 this AM; pt states he felt nauseous at the time the chest pain began, but he says it has gone away; pt states he has had 5 CABG, has diabetes and HTN; ex-smoker (quit 40 yrs ago)  EXAM: CHEST  2 VIEW  COMPARISON:  04/18/2012  FINDINGS: Changes from CABG surgery, stable. Cardiac silhouette normal in size and configuration. No mediastinal or hilar masses or evidence of adenopathy.  Mild bilateral irregular interstitial thickening is increased when compared to the prior study. There is mild thickening of the fissures and minimal effusions. No lung consolidation to suggest pneumonia. Lungs are mildly hyperexpanded. No pneumothorax.  Bony thorax is demineralized but grossly intact.  IMPRESSION: 1. Mild interstitial thickening with minimal effusions. Findings are consistent with mild congestive heart failure if there shortness of  breath. No evidence of pneumonia.   Electronically Signed   By: Lajean Manes M.D.   On: 08/11/2014 11:57    IMPRESSION: Principal Problem:   Syncope Active Problems:   Chest pain   Complete heart block   Troponin level elevated-0.54   Acute on chronic renal insufficiency   S/P CABG x 5 1989. Low risk Myoview 2012   DM (diabetes mellitus), type 2 with renal complications   Essential hypertension   Chronic kidney disease, stage III (moderate)   Atrial fibrillation-CHADS VASC2 = 5 (age, HTN, DM, Vasc)   Dyslipidemia   GERD   PVD- s/p bilat CEA- CA dopplers OK Jan 2015 acute on chronic diastolic heart failure, improved after insertion of a temporary PM  RECOMMENDATION: [name] to see. Clear liquids this am, pacemaker this afternoon. Check BMP and TSH this am. Will stop Heparin now. He may need long term anticoagulation post pacemaker.   Time Spent Directly with Patient: 45 minutes  Erlene Quan [phone] beeper 08/13/2014, 8:40 AM   EP Attending  Patient seen and examined. Agree with above. The patient has persistent symptomatic AV block and atrial fib withich apparently is new. Will plan PPM insertion followed by 3-4 weeks of anti-coagulation followed by DCCV.  I have discussed the risks/benefits/goals/expectations of the procedure with the patient and he wishes to proceed.   Mikle Bosworth.D.

## 2014-08-13 NOTE — Clinical Documentation Improvement (Signed)
"  Acute CHF secondary to complete heart block" documented in current record.  Please specify the type of acute CHF (diastolic, systolic, combined diastolic and systolic) being treated this admission, if known,  and document in your progress note and discharge summary.    Thank you, Mateo Flow, RN [phone] Clinical Documentation Specialist

## 2014-08-13 NOTE — CV Procedure (Signed)
SURGEON:  Cristopher Peru, MD     PREPROCEDURE DIAGNOSIS:  Symptomatic Bradycardia due to complete heart block    POSTPROCEDURE DIAGNOSIS:  Same as preprocedure diagnosis     PROCEDURES:   1. Pacemaker implantation.     INTRODUCTION: Dean Murphy is a 81 y.o. male  with a history of bradycardia who presents today for pacemaker implantation.  The patient reports intermittent episodes of dizziness over the past few months.  No reversible causes have been identified.  The patient therefore presents today for pacemaker implantation.     DESCRIPTION OF PROCEDURE:  Informed written consent was obtained, and   the patient was brought to the electrophysiology lab in a fasting state.  The patient required no sedation for the procedure today.  The patients left chest was prepped and draped in the usual sterile fashion by the EP lab staff. The skin overlying the left deltopectoral region was infiltrated with lidocaine for local analgesia.  A 4-cm incision was made over the left deltopectoral region.  A left subcutaneous pacemaker pocket was fashioned using a combination of sharp and blunt dissection. Electrocautery was required to assure hemostasis.     RA/RV Lead Placement: The left axillary vein was therefore directly cannulated.  Through the left axillary vein, a St. Jude [id number] (serial number [id number]) right atrial lead and a St. Jude [id number] (serial number [id number]) right ventricular lead were advanced with fluoroscopic visualization into the right atrial appendage and right ventricular apical septal positions respectively.  Initial atrial lead P- waves measured 2.8 mV with impedance of 411 ohms and a threshold of 0.7 V at 0.5 msec.  Right ventricular lead R-waves measured 32 mV with an impedance of 781 ohms and a threshold of 0.6 V at 0.5 msec.  Both leads were secured to the pectoralis fascia using #2-0 silk over the suture sleeves.   Device Placement:  The leads were then connected to a St. Jude DDD  (serial number [id number]) pacemaker.  The pocket was irrigated with copious gentamicin solution.  The pacemaker was then placed into the pocket.  The pocket was then closed in 2 layers with 2.0 Vicryl suture for the subcutaneous and subcuticular layers.  Steri-Strips and a sterile dressing were then applied.  There were no early apparent complications.     CONCLUSIONS:   1. Successful implantation of a St. Jude dual-chamber pacemaker for symptomatic bradycardia due to complete heart block.  2. No early apparent complications.           Cristopher Peru, MD 08/13/2014 4:19 PM

## 2014-08-13 NOTE — Progress Notes (Signed)
Dr Wynonia Lawman, on-call for Cardiology made aware of positive D dimer. Pt already on heparin. No new orders at this time. Will continue to monitor.

## 2014-08-14 ENCOUNTER — Encounter (HOSPITAL_COMMUNITY): Payer: Self-pay | Admitting: Internal Medicine

## 2014-08-14 ENCOUNTER — Inpatient Hospital Stay (HOSPITAL_COMMUNITY): Payer: Medicare Other

## 2014-08-14 DIAGNOSIS — I340 Nonrheumatic mitral (valve) insufficiency: Secondary | ICD-10-CM

## 2014-08-14 DIAGNOSIS — Z950 Presence of cardiac pacemaker: Secondary | ICD-10-CM

## 2014-08-14 LAB — BASIC METABOLIC PANEL
Anion gap: 8 (ref 5–15)
BUN: 36 mg/dL — AB (ref 6–23)
CO2: 25 mmol/L (ref 19–32)
Calcium: 8.3 mg/dL — ABNORMAL LOW (ref 8.4–10.5)
Chloride: 110 mmol/L (ref 96–112)
Creatinine, Ser: 1.73 mg/dL — ABNORMAL HIGH (ref 0.50–1.35)
GFR, EST AFRICAN AMERICAN: 40 mL/min — AB (ref >90)
GFR, EST NON AFRICAN AMERICAN: 34 mL/min — AB (ref >90)
Glucose, Bld: 151 mg/dL — ABNORMAL HIGH (ref 70–99)
Potassium: 4.4 mmol/L (ref 3.5–5.1)
Sodium: 143 mmol/L (ref 135–145)

## 2014-08-14 LAB — GLUCOSE, CAPILLARY
GLUCOSE-CAPILLARY: 150 mg/dL — AB (ref 70–99)
Glucose-Capillary: 173 mg/dL — ABNORMAL HIGH (ref 70–99)
Glucose-Capillary: 155 mg/dL — ABNORMAL HIGH (ref 70–99)
Glucose-Capillary: 144 mg/dL — ABNORMAL HIGH (ref 70–99)

## 2014-08-14 LAB — CBC
HCT: 34.3 % — ABNORMAL LOW (ref 39.0–52.0)
Hemoglobin: 10.8 g/dL — ABNORMAL LOW (ref 13.0–17.0)
MCH: 28.2 pg (ref 26.0–34.0)
MCHC: 31.5 g/dL (ref 30.0–36.0)
MCV: 89.6 fL (ref 78.0–100.0)
Platelets: 122 10*3/uL — ABNORMAL LOW (ref 150–400)
RBC: 3.83 MIL/uL — AB (ref 4.22–5.81)
RDW: 13.8 % (ref 11.5–15.5)
WBC: 6.7 10*3/uL (ref 4.0–10.5)

## 2014-08-14 MED ORDER — MORPHINE SULFATE 2 MG/ML IJ SOLN: 2.0000 mg | Freq: Once | INTRAMUSCULAR | Status: DC

## 2014-08-14 MED ORDER — INSULIN ASPART 100 UNIT/ML ~~LOC~~ SOLN: 0.0000 [IU] | Freq: Three times a day (TID) | SUBCUTANEOUS | Status: DC

## 2014-08-14 MED ORDER — APIXABAN 2.5 MG PO TABS
2.5000 mg | ORAL_TABLET | Freq: Two times a day (BID) | ORAL | Status: DC
  Administered 2014-08-14 – 2014-08-15 (×3): 2.5 mg via ORAL
  Filled 2014-08-14 (×5): qty 1

## 2014-08-14 NOTE — Progress Notes (Signed)
Patient continues to show low oxygen saturation on room air.  Mid to upper 80's at rest, 79% - 81% with ambulation.  EP aware; considering pulmonary consult.

## 2014-08-14 NOTE — Discharge Instructions (Addendum)
Supplemental Discharge Instructions for  Pacemaker/Defibrillator Patients  Activity No heavy lifting or vigorous activity with your left/right arm for 6 to 8 weeks.  Do not raise your left/right arm above your head for one week.  Gradually raise your affected arm as drawn below.           __         08-19-14                  08-20-14                08-21-14                    08-22-14   WOUND CARE - Keep the wound area clean and dry.  Do not get this area wet for one week. No showers for one week; you may shower on  08-22-14   . - The tape/steri-strips on your wound will fall off; do not pull them off.  No bandage is needed on the site.  DO  NOT apply any creams, oils, or ointments to the wound area. - If you notice any drainage or discharge from the wound, any swelling or bruising at the site, or you develop a fever > 101? F after you are discharged home, call the office at once.  Special Instructions - You are still able to use cellular telephones; use the ear opposite the side where you have your pacemaker/defibrillator.  Avoid carrying your cellular phone near your device. - When traveling through airports, show security personnel your identification card to avoid being screened in the metal detectors.  Ask the security personnel to use the hand wand. - Avoid arc welding equipment, MRI testing (magnetic resonance imaging), TENS units (transcutaneous nerve stimulators).  Call the office for questions about other devices. - Avoid electrical appliances that are in poor condition or are not properly grounded. - Microwave ovens are safe to be near or to operate.     Information on my medicine - ELIQUIS (apixaban)  This medication education was reviewed with me or my healthcare representative as part of my discharge preparation.  The pharmacist that spoke with me during my hospital stay was:  Dareen Piano, [facility]  Why was Eliquis prescribed for you? Eliquis was prescribed for you  to reduce the risk of a blood clot forming that can cause a stroke if you have a medical condition called atrial fibrillation (a type of irregular heartbeat).  What do You need to know about Eliquis ? Take your Eliquis TWICE DAILY - one tablet in the morning and one tablet in the evening with or without food. If you have difficulty swallowing the tablet whole please discuss with your pharmacist how to take the medication safely.  Take Eliquis exactly as prescribed by your doctor and DO NOT stop taking Eliquis without talking to the doctor who prescribed the medication.  Stopping may increase your risk of developing a stroke.  Refill your prescription before you run out.  After discharge, you should have regular check-up appointments with your healthcare provider that is prescribing your Eliquis.  In the future your dose may need to be changed if your kidney function or weight changes by a significant amount or as you get older.  What do you do if you miss a dose? If you miss a dose, take it as soon as you remember on the same day and resume taking twice daily.  Do not take more  than one dose of ELIQUIS at the same time to make up a missed dose.  Important Safety Information A possible side effect of Eliquis is bleeding. You should call your healthcare provider right away if you experience any of the following: ? Bleeding from an injury or your nose that does not stop. ? Unusual colored urine (red or dark brown) or unusual colored stools (red or black). ? Unusual bruising for unknown reasons. ? A serious fall or if you hit your head (even if there is no bleeding).  Some medicines may interact with Eliquis and might increase your risk of bleeding or clotting while on Eliquis. To help avoid this, consult your healthcare provider or pharmacist prior to using any new prescription or non-prescription medications, including herbals, vitamins, non-steroidal anti-inflammatory drugs (NSAIDs) and  supplements.  This website has more information on Eliquis (apixaban): http://www.eliquis.com/eliquis/home

## 2014-08-14 NOTE — Care Management Note (Addendum)
    Page 1 of 2   08/15/2014     2:46:09 PM CARE MANAGEMENT NOTE 08/15/2014  Patient:  Dean Murphy, Dean Murphy   Account Number:  [id number]  Date Initiated:  08/13/2014  Documentation initiated by:  Elissa Hefty  Subjective/Objective Assessment:   adm w ch pain     Action/Plan:   lives w wife, pcp dr Elsie Stain   Anticipated DC Date:  08/15/2014   Anticipated DC Plan:  HOME/SELF CARE      DC Planning Services  CM consult      PAC Choice  DURABLE MEDICAL EQUIPMENT   Choice offered to / List presented to:  C-1 Patient   DME arranged  [facility]      DME agency  [place].        Status of service:  Completed, signed off Medicare Important Message given?  YES (If response is "NO", the following Medicare IM given date fields will be blank) Date Medicare IM given:  08/15/2014 Medicare IM given by:   Date Additional Medicare IM given:   Additional Medicare IM given by:    Discharge Disposition:  [place]  Per UR Regulation:  Reviewed for med. necessity/level of care/duration of stay  If discussed at [place] of Stay Meetings, dates discussed:    Comments:  08/15/2014 @ [place] RN,BSN,CM Home oxygen order noted  for pt. CM spoke with [facility] @ [facility] and referral made. Pt informed oxygen to be delivered @ bedside.   08/14/2014 @ [facility] RN,BSN,CM Eliquis 30 day free card given to pt per CM. Informed pt of $13.00 copay  per [place]. no other needs identified by CM.  08/14/2014 @ 11:30 [facility] Benefit check for eliquis in process, will make pt aware once completed. [facility]. [facility].

## 2014-08-14 NOTE — Progress Notes (Signed)
O2 sats into the 70's with ambulation, discussed with [name].  Will ask pulmonary to see.  Chanetta Marshall, NP 08/14/2014 11:45 AM

## 2014-08-14 NOTE — Progress Notes (Signed)
Patient ID: GRAYSYN BACHE, male   DOB: 1927-04-02, 81 y.o.   MRN: [id number]    Patient Name: Dean Murphy Date of Encounter: 08/14/2014     Principal Problem:   Syncope Active Problems:   DM (diabetes mellitus), type 2 with renal complications   Dyslipidemia   Essential hypertension   GERD   Chronic kidney disease, stage III (moderate)   PVD- s/p bilat CEA- CA dopplers OK Jan 2015   Chest pain   Complete heart block   Troponin level elevated-0.54   Acute on chronic renal insufficiency   S/P CABG x 5 1989. Low risk Myoview 2012   Atrial fibrillation-CHADS VASC2 = 5 (age, HTN, DM, Vasc)    SUBJECTIVE  Minimal dyspnea. No chest pain.   CURRENT MEDS . amLODipine  10 mg Oral Daily  . aspirin  81 mg Oral Daily  . atorvastatin  40 mg Oral q1800  . febuxostat  40 mg Oral Daily  . furosemide  40 mg Oral Daily  . glipiZIDE  10 mg Oral QAC breakfast  . pantoprazole  40 mg Oral QAC breakfast    OBJECTIVE  Filed Vitals:   08/13/14 2331 08/14/14 0009 08/14/14 0421 08/14/14 0803  BP: 145/46  139/52 128/76  Pulse: 78  71 77  Temp: 99.8 F (37.7 C)  98.3 F (36.8 C) 98.2 F (36.8 C)  TempSrc: Oral  Oral Oral  Resp: 15  17 18   Height:      Weight:  211 lb 10.3 oz (96 kg)    SpO2: 94%  94% 92%    Intake/Output Summary (Last 24 hours) at 08/14/14 0813 Last data filed at 08/14/14 0803  Gross per 24 hour  Intake    420 ml  Output   1325 ml  Net   -905 ml   Filed Weights   08/11/14 1119 08/11/14 1830 08/14/14 0009  Weight: 220 lb (99.791 kg) 215 lb 13.3 oz (97.9 kg) 211 lb 10.3 oz (96 kg)    PHYSICAL EXAM  General: Pleasant, NAD. Neuro: Alert and oriented X 3. Moves all extremities spontaneously. Psych: Normal affect. HEENT:  Normal  Neck: Supple without bruits or JVD. Lungs:  Resp regular and unlabored, CTA. Heart: RRR no s3, s4, or murmurs. Abdomen: Soft, non-tender, non-distended, BS + x 4.  Extremities: No clubbing, cyanosis or edema. DP/PT/Radials 2+ and  equal bilaterally.  Accessory Clinical Findings  CBC  Recent Labs  08/13/14 0311 08/14/14 0335  WBC 7.7 6.7  HGB 10.7* 10.8*  HCT 33.7* 34.3*  MCV 88.5 89.6  PLT 125* 557*   Basic Metabolic Panel  Recent Labs  08/13/14 0931 08/14/14 0335  NA 142 143  K 4.2 4.4  CL 111 110  CO2 25 25  GLUCOSE 165* 151*  BUN 37* 36*  CREATININE 2.01* 1.73*  CALCIUM 8.2* 8.3*   Liver Function Tests  Recent Labs  08/11/14 1142  AST 27  ALT 25  ALKPHOS 86  BILITOT 0.8  PROT 6.1  ALBUMIN 3.5   No results for input(s): LIPASE, AMYLASE in the last 72 hours. Cardiac Enzymes  Recent Labs  08/12/14 1300 08/12/14 1613 08/12/14 2205  TROPONINI 0.54* 0.54* 0.53*   BNP Invalid input(s): POCBNP D-Dimer  Recent Labs  08/12/14 2205  DDIMER 0.69*   Hemoglobin A1C No results for input(s): HGBA1C in the last 72 hours. Fasting Lipid Panel No results for input(s): CHOL, HDL, LDLCALC, TRIG, CHOLHDL, LDLDIRECT in the last 72 hours. Thyroid Function Tests  Recent  Labs  08/13/14 0931  TSH 1.582    TELE  NSR with ventricular pacing  Radiology/Studies  Dg Chest 2 View  08/14/2014   CLINICAL DATA:  Pacemaker insertion.  Complete heart block.  EXAM: CHEST  2 VIEW  COMPARISON:  08/11/2014  FINDINGS: Dual lead pacemaker is been inserted. No pneumothorax. Heart size and pulmonary vascularity are normal. Slight interstitial edema with small bilateral pleural effusions, slightly increased.  No osseous abnormality.  IMPRESSION: Increased slight interstitial edema and bilateral effusions.   Electronically Signed   By: Lorriane Shire M.D.   On: 08/14/2014 07:22   Dg Chest 2 View  08/11/2014   CLINICAL DATA:  Right upper chest pain that began around 0900 this AM; pt states he felt nauseous at the time the chest pain began, but he says it has gone away; pt states he has had 5 CABG, has diabetes and HTN; ex-smoker (quit 40 yrs ago)  EXAM: CHEST  2 VIEW  COMPARISON:  04/18/2012  FINDINGS:  Changes from CABG surgery, stable. Cardiac silhouette normal in size and configuration. No mediastinal or hilar masses or evidence of adenopathy.  Mild bilateral irregular interstitial thickening is increased when compared to the prior study. There is mild thickening of the fissures and minimal effusions. No lung consolidation to suggest pneumonia. Lungs are mildly hyperexpanded. No pneumothorax.  Bony thorax is demineralized but grossly intact.  IMPRESSION: 1. Mild interstitial thickening with minimal effusions. Findings are consistent with mild congestive heart failure if there shortness of breath. No evidence of pneumonia.   Electronically Signed   By: Lajean Manes M.D.   On: 08/11/2014 11:57    ASSESSMENT AND PLAN  1. Complete heart block 2. Acute diastolic heart failure due to #1 3. S/p PPM insertion with the new device working normally.  4. Acute renal failure, resolving and due to a low output state due to bradycardia 5. Atrial fib with a slow vr Rec: ok for discharge later today. He has had some decrease in his oxygen saturation. Will give lasix and check oxygen off of supplemental nasal cannula. He will need systemic anticoagulation with Eliquis.   Charitie Hinote,M.D.  08/14/2014 8:13 AM

## 2014-08-14 NOTE — Progress Notes (Signed)
  Echocardiogram 2D Echocardiogram has been performed.  Dean Murphy 08/14/2014, 11:40 AM

## 2014-08-15 ENCOUNTER — Inpatient Hospital Stay (HOSPITAL_COMMUNITY): Payer: Medicare Other

## 2014-08-15 ENCOUNTER — Encounter (HOSPITAL_COMMUNITY): Payer: Self-pay | Admitting: Pulmonary Disease

## 2014-08-15 DIAGNOSIS — R0789 Other chest pain: Secondary | ICD-10-CM

## 2014-08-15 DIAGNOSIS — R0902 Hypoxemia: Secondary | ICD-10-CM

## 2014-08-15 DIAGNOSIS — J810 Acute pulmonary edema: Secondary | ICD-10-CM

## 2014-08-15 LAB — GLUCOSE, CAPILLARY
GLUCOSE-CAPILLARY: 122 mg/dL — AB (ref 70–99)
GLUCOSE-CAPILLARY: 141 mg/dL — AB (ref 70–99)

## 2014-08-15 LAB — BASIC METABOLIC PANEL
ANION GAP: 6 (ref 5–15)
BUN: 33 mg/dL — AB (ref 6–23)
CHLORIDE: 109 mmol/L (ref 96–112)
CO2: 26 mmol/L (ref 19–32)
Calcium: 8.5 mg/dL (ref 8.4–10.5)
Creatinine, Ser: 1.67 mg/dL — ABNORMAL HIGH (ref 0.50–1.35)
GFR calc non Af Amer: 36 mL/min — ABNORMAL LOW (ref >90)
GFR, EST AFRICAN AMERICAN: 41 mL/min — AB (ref >90)
GLUCOSE: 115 mg/dL — AB (ref 70–99)
POTASSIUM: 4 mmol/L (ref 3.5–5.1)
SODIUM: 141 mmol/L (ref 135–145)

## 2014-08-15 MED ORDER — ATORVASTATIN CALCIUM 40 MG PO TABS: 40.0000 mg | ORAL_TABLET | Freq: Every day | ORAL | Status: DC

## 2014-08-15 MED ORDER — APIXABAN 2.5 MG PO TABS: 2.5000 mg | ORAL_TABLET | Freq: Two times a day (BID) | ORAL | Status: DC

## 2014-08-15 NOTE — Progress Notes (Signed)
Preliminary Results: Bilateral lower extremity venous duplex completed. No evidence for DVT, SVT, or Baker's cyst. Brianna L Mazza,RVT

## 2014-08-15 NOTE — Progress Notes (Signed)
SATURATION QUALIFICATIONS: (This note is used to comply with regulatory documentation for home oxygen)  Patient Saturations on Room Air at Rest = 94-96%  Patient Saturations on Room Air while Ambulating = 89-91%  Patient Saturations on 2 Liters of oxygen while Ambulating = 93-94%  Please briefly explain why patient needs home oxygen: Reassessment today does not qualify for home oxygen. Notified Dr. Lovena Le and Whitman Hero CM. Thank you.

## 2014-08-15 NOTE — Consult Note (Signed)
Name: Dean Murphy MRN: [id number] DOB: 05-24-1927    ADMISSION DATE:  08/11/2014 CONSULTATION DATE:  08/15/14  REFERRING MD :  Dr. Lovena Le  CHIEF COMPLAINT:  Hypoxia   BRIEF PATIENT DESCRIPTION: 81 y/o M admitted with 3/19 with 2 syncopal episodes, SOB, dizziness and chest pain. 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  Noted to have hypoxemia on 3/23, PCCM consulted for evaluation.    SIGNIFICANT EVENTS  3/19  Admit with syncope, chest pain, dizziness.  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 Lovena Le   STUDIES:  3/22 CXR >> mild edema, small bilateral effusions    HISTORY OF PRESENT ILLNESS:  81 y/o M, former smoker (1ppd x 25 years) with PMH of GERD, DM, Arthritis, CKD III/IV HLD, HTN, CAD s/p CABG (first MI at age 36) and CEA who presented to [facility] on 3/19 with approximately one month of increasing fatigue & dyspnea on exertion.  At baseline, he is very active - drives, continues to golf etc and has no difficulty with activity of choice.  Around 3/18-3/19 he began having shortness of breath, DOE and chest pressure. He had to lie down in the yard while taking the trash out to rest from SOB / Fatigue.  Symptoms worsened as the day went on and he could not sleep.  He attempted to go to the restroom in the early am of 3/19 and was dizzy / felt as though he would pass out.  He stumbled and fell twice - he did not think he would be able to ambulate back to his wife to ask for help.  He reports cold sweats, nausea but no vomiting with episodes.  The patient has also noted lower extremity swelling in the last 6 months (L>R, harvest leg is R from prior CABG) and has been treated for gout by PCP.  He denied fevers, chills, vomiting, diarrhea, sick contacts.    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  On  3/22, the patient was noted to have desaturations into the 70's with ambulation.  CXR evaluation 3/23 was notable for small bilateral pleural effusions, bibasilar atelectasis and improved edema. PCCM consulted for evaluation of hypoxia.     PAST MEDICAL HISTORY :   has a past medical history of Diabetes mellitus; GERD (gastroesophageal reflux disease); Hyperlipidemia; Hypertension; Arthritis; Diverticulosis; Hemorrhoids; Colon polyps; CAD (coronary artery disease); CHB (complete heart block) (March 2016); Osteoporosis; and Chronic kidney disease, stage IV (severe).  has past surgical history that includes Coronary artery bypass graft; Cholecystectomy; Total knee arthroplasty; Tonsillectomy; Carotid endarterectomy (12/31/10); Carotid endarterectomy (02/09/11); Joint replacement; Cardiac catheterization (08/11/2014); and permanent pacemaker insertion (N/A, 08/13/2014).   HOME MEDICATIONS: Prior to Admission medications   Medication Sig Start Date End Date Taking? Authorizing Provider  AMITIZA 8 MCG capsule TAKE 1 CAPSULE DAILY WITH  BREAKFAST 01/31/14  Yes Tonia Ghent, MD  amLODipine (NORVASC) 10 MG tablet Take 1 tablet (10 mg total) by mouth daily. 04/25/14  Yes Tonia Ghent, MD  aspirin 325 MG tablet Take 325 mg by mouth daily.     Yes Historical Provider, MD  colchicine 0.6 MG tablet Take 1 tablet (0.6 mg total) by mouth 2 (two) times daily as needed. As needed for gout attacks   Stop afterwards 02/23/14  Yes  Venia Carbon, MD  febuxostat (ULORIC) 40 MG tablet Take 1 tablet (40 mg total) by mouth daily. 08/09/13  Yes Ricard Dillon, MD  furosemide (LASIX) 40 MG tablet Take 1 tablet (40 mg total) by mouth daily as needed. 03/15/14 03/15/15 Yes Tonia Ghent, MD  glipiZIDE (GLUCOTROL) 10 MG tablet Take 1 tablet (10 mg total) by mouth daily before breakfast. 08/09/13  Yes Ricard Dillon, MD  NON FORMULARY daily. careconcepts lancets an d glucometer strips   Yes Historical Provider, MD  omeprazole  (PRILOSEC) 20 MG capsule Take 1 capsule (20 mg total) by mouth daily. 12/01/13 03/12/15 Yes Tonia Ghent, MD  amoxicillin-clavulanate (AUGMENTIN) 875-125 MG per tablet Take 1 tablet by mouth 2 (two) times daily. Patient not taking: Reported on 08/11/2014 06/13/14   Tonia Ghent, MD  benazepril (LOTENSIN) 20 MG tablet Take 1 tablet (20 mg total) by mouth daily. Patient not taking: Reported on 08/11/2014 12/01/13   Tonia Ghent, MD  HYDROcodone-homatropine [facility]) 5-1.5 MG/5ML syrup Take 2.5-5 mLs by mouth every 8 (eight) hours as needed for cough (sedation caution). Patient not taking: Reported on 08/11/2014 06/13/14   Tonia Ghent, MD   Allergies  Allergen Reactions  . Reclast [Zoledronic Acid]     Stopped 2015 due to Cr    FAMILY HISTORY:  family history includes Cancer in an other family member; Diabetes in an other family member; Heart attack in his father; Heart disease in his mother.   SOCIAL HISTORY:  reports that he quit smoking about 43 years ago. His smoking use included Cigarettes. He has a 5 pack-year smoking history. He has never used smokeless tobacco. He reports that he drinks alcohol. He reports that he does not use illicit drugs.  REVIEW OF SYSTEMS:   Constitutional: Negative for fever, chills, weight loss, malaise/fatigue and diaphoresis.  HENT: Negative for hearing loss, ear pain, nosebleeds, congestion, sore throat, neck pain, tinnitus and ear discharge.   Eyes: Negative for blurred vision, double vision, photophobia, pain, discharge and redness.  Respiratory: Negative for cough, hemoptysis, sputum production, wheezing and stridor.  Positive for sinus / allergy issues, hx of broken nose with difficulty breathing out of one side.   Cardiovascular: Negative for palpitations, orthopnea, claudication, and PND. Positive for SOB, DOE, chest pain. Gastrointestinal: Negative for heartburn, nausea, vomiting, abdominal pain, diarrhea, constipation, blood in stool and melena.   Genitourinary: Negative for dysuria, urgency, frequency, hematuria and flank pain.  Musculoskeletal: Negative for myalgias, back pain, joint pain and falls.  Skin: Negative for itching and rash.  Neurological: Negative for tingling, tremors, sensory change, speech change, focal weakness, seizures, loss of consciousness, and headaches. Positive for dizziness, weakness PTA.  Resolved currently.  Endo/Heme/Allergies: Negative for environmental allergies and polydipsia. Does not bruise/bleed easily.  SUBJECTIVE: Pt reports feeling better after pacemaker insertion.  Improved LE edema.   VITAL SIGNS: Temp:  [97.5 F (36.4 C)-98.6 F (37 C)] 98.3 F (36.8 C) (03/23 0819) Pulse Rate:  [70-80] 80 (03/23 0819) Resp:  [16-20] 18 (03/23 0819) BP: (107-153)/(34-80) 153/80 mmHg (03/23 0819) SpO2:  [81 %-97 %] 95 % (03/23 0819) Weight:  [210 lb 12.2 oz (95.6 kg)] 210 lb 12.2 oz (95.6 kg) (03/23 0036)  PHYSICAL EXAMINATION: General:  wdwn elderly adult male in NAD Neuro:  AAOx4, speech clear, MAE HEENT:  Mm pink/moist, no jvd Cardiovascular:  s1s2 irr irr, rate 80's on monitor, no m/r/g Lungs:  resp's even/non-labored, lungs bilaterally with few basilar posterior crackles  Abdomen:  Round/soft, bsx4 active Musculoskeletal:  No acute deformities.  Negative Homan's bilaterally  Skin:  Warm/dry, no edema    Recent Labs Lab 08/13/14 0931 08/14/14 0335 08/15/14 0447  NA 142 143 141  K 4.2 4.4 4.0  CL 111 110 109  CO2 25 25 26   BUN 37* 36* 33*  CREATININE 2.01* 1.73* 1.67*  GLUCOSE 165* 151* 115*    Recent Labs Lab 08/12/14 0353 08/13/14 0311 08/14/14 0335  HGB 11.2* 10.7* 10.8*  HCT 35.2* 33.7* 34.3*  WBC 11.2* 7.7 6.7  PLT 138* 125* 122*   Dg Chest 2 View  08/14/2014   CLINICAL DATA:  Pacemaker insertion.  Complete heart block.  EXAM: CHEST  2 VIEW  COMPARISON:  08/11/2014  FINDINGS: Dual lead pacemaker is been inserted. No pneumothorax. Heart size and pulmonary vascularity are  normal. Slight interstitial edema with small bilateral pleural effusions, slightly increased.  No osseous abnormality.  IMPRESSION: Increased slight interstitial edema and bilateral effusions.   Electronically Signed   By: Lorriane Shire M.D.   On: 08/14/2014 07:22    ASSESSMENT / PLAN:  Hypoxia - new since admission, prior chart review with normal room air saturations.  Admitted with CHB, dCHF, new onset AFib.  Suspect hypoxemia is multifactorial in setting of prior tobacco abuse (although no significant emphysema on CXR), bibasilar atelectasis, pulmonary edema  / dCHF, & mild anemia.  Also must consider PE in differential with mildly elevated D-Dimer on admit.    Plan: Assess LE venous duplex to r/o DVT / PE  No CTA due to CKD / dye load risk  Repeat CXR now to ensure no new changes Will be anticoagulated with Eliquis for Afib Follow up with Pulmonary in office, may be able to come off O2 after short recovery period Arrange for home O2 at 2L continuous    Noe Gens, NP-C [facility] Pgr: [phone] or [phone]   PCCM ATTENDING: I have reviewed pt's initial presentation, consultants notes and hospital database in detail.  The above assessment and plan was formulated under my direction.  CXR appears better after diuresis. Cause of hypoxemia was likely mild pulm edema. Agree with above. I doubt he will require long term oxygen therapy  Merton Border, MD;  PCCM service; [place] [phone]  08/15/2014, 9:33 AM

## 2014-08-15 NOTE — Discharge Summary (Signed)
ELECTROPHYSIOLOGY PROCEDURE DISCHARGE SUMMARY    Patient ID: Dean Murphy,  MRN: [id number], DOB/AGE: 1926/08/06 81 y.o.  Admit date: 08/11/2014 Discharge date: 08/15/2014  Primary Care Physician: Elsie Stain, MD Primary Cardiologist: Ena Dawley, MD Electrophysiologist: Lovena Le  Primary Discharge Diagnosis:  Symptomatic complete heart block status post pacemaker implantation this admission Acute on chronic renal failure  Hypoxia Atrial fibrillation Acute diastolic heart failure secondary to complete heart block  Secondary Discharge Diagnosis:  1.  CAD s/p CABG 1989 2.  Hypertensin 3.  Hyperlipidemia 4.  Diabetes 5.  GERD 6.  Carotid artery disease s/p CEA  Allergies  Allergen Reactions  . Reclast [Zoledronic Acid]     Stopped 2015 due to Cr     Procedures This Admission:  1.  Insertion of a temporary transvenous pacemaker on 08-11-14 by Dr Gwenlyn Found for symptomatic complete heart block. There were no early apparent complications 2.   Echocardiogram on 08-14-14 demonstrated EF 60-65%, no RWMA, mild MR, moderately dilated LA 3.  Implantation of a STJ dual chamber PPM on 08-13-14 by [name].  The patient received a STJ model number Assurity PPM with model number 8185 right atrial lead and 1888 right ventricular lead. There were no immediate post procedure complications. 4.  CXR on 08-14-14 demonstrated no pneumothorax status post device implantation.   Brief HPI/Hospital Course:  Dean Murphy is a 81 y.o. male with a past medical history as outlined above.  He presented on the day of admission following development of shortness of breath and dizziness on exertion.   He had also had 2 syncopal spells.  On arrival, he was found to be in complete heart block and underwent placement of a temporary transvenous pacemaker.  Lab work was notable for acute on chronic renal failure that improved at time of discharge.  Troponin was minimally elevated but this was felt to be due to  demand ischemia from bradycardia.  Echocardiogram demonstrated normal EF. He was evaluated by [name] who recommended pacemaker placement with no reversible causes for heart block identified.  Risks, benefits, and alternatives to PPM implantation were reviewed with the patient who wished to proceed. Prior to pacemaker implantation, the patient developed atrial fibrillation.  With a CHADS2VASC score of at least 5, long term anticoagulation will be required.  The patient underwent implantation of a STJ dual chamber pacemaker with details as outlined above.  He  was monitored on telemetry overnight which demonstrated sinus rhythm with ventricular pacing.  Left chest was without hematoma or ecchymosis.  The device was interrogated and found to be functioning normally.  CXR was obtained and demonstrated no pneumothorax status post device implantation.  He had some hypoxia post procedure and required oxygen to maintain saturations.  Pulmonary was consulted who recommended LE dopplers and home O2 with follow up in their clinic in 2 weeks.  Wound care, arm mobility, and restrictions were reviewed with the patient.  Tthe patient was examined and considered stable for discharge to home.    Physical Exam: Filed Vitals:   08/15/14 0036 08/15/14 0450 08/15/14 0819 08/15/14 1156  BP: 107/71 124/44 153/80 158/74  Pulse: 74 70 80 77  Temp: 98.6 F (37 C) 98.2 F (36.8 C) 98.3 F (36.8 C) 98.1 F (36.7 C)  TempSrc: Oral Oral Oral Oral  Resp: 18 20 18 18   Height:      Weight: 210 lb 12.2 oz (95.6 kg)     SpO2: 94% 97% 95% 96%  Labs:   Lab Results  Component Value Date   WBC 6.7 08/14/2014   HGB 10.8* 08/14/2014   HCT 34.3* 08/14/2014   MCV 89.6 08/14/2014   PLT 122* 08/14/2014    Recent Labs Lab 08/11/14 1142  08/15/14 0447  NA 142  < > 141  K 4.3  < > 4.0  CL 111  < > 109  CO2 21  < > 26  BUN 38*  < > 33*  CREATININE 3.58*  < > 1.67*  CALCIUM 8.8  < > 8.5  PROT 6.1  --   --   BILITOT  0.8  --   --   ALKPHOS 86  --   --   ALT 25  --   --   AST 27  --   --   GLUCOSE 192*  < > 115*  < > = values in this interval not displayed.  Discharge Medications:    Medication List    STOP taking these medications        amoxicillin-clavulanate 875-125 MG per tablet  Commonly known as:  AUGMENTIN     aspirin 325 MG tablet     colchicine 0.6 MG tablet      TAKE these medications        AMITIZA 8 MCG capsule  Generic drug:  lubiprostone  TAKE 1 CAPSULE DAILY WITH  BREAKFAST     amLODipine 10 MG tablet  Commonly known as:  NORVASC  Take 1 tablet (10 mg total) by mouth daily.     apixaban 2.5 MG Tabs tablet  Commonly known as:  ELIQUIS  Take 1 tablet (2.5 mg total) by mouth 2 (two) times daily.     atorvastatin 40 MG tablet  Commonly known as:  LIPITOR  Take 1 tablet (40 mg total) by mouth daily at 6 PM.     benazepril 20 MG tablet  Commonly known as:  LOTENSIN  Take 1 tablet (20 mg total) by mouth daily.     febuxostat 40 MG tablet  Commonly known as:  ULORIC  Take 1 tablet (40 mg total) by mouth daily.     furosemide 40 MG tablet  Commonly known as:  LASIX  Take 1 tablet (40 mg total) by mouth daily as needed.     glipiZIDE 10 MG tablet  Commonly known as:  GLUCOTROL  Take 1 tablet (10 mg total) by mouth daily before breakfast.     HYDROcodone-homatropine 5-1.5 MG/5ML syrup  Commonly known as:  HYCODAN  Take 2.5-5 mLs by mouth every 8 (eight) hours as needed for cough (sedation caution).     NON FORMULARY  daily. careconcepts lancets an d glucometer strips     omeprazole 20 MG capsule  Commonly known as:  PRILOSEC  Take 1 capsule (20 mg total) by mouth daily.        Disposition:  Discharge Instructions    Diet - low sodium heart healthy    Complete by:  As directed      Increase activity slowly    Complete by:  As directed           Follow-up Information    Follow up with PARRETT,TAMMY, NP On 08/31/2014.   Specialty:  Nurse Practitioner     Why:  Appt at 2:15 PM - [facility] across the road from [place] information:   [address] [address] [phone]       Follow up with  Patsey Berthold, NP On 08/29/2014.   Specialty:  Nurse Practitioner   Why:  at 3:30PM   Contact information:   [place] [address] [phone]       Follow up with Cristopher Peru, MD On 11/15/2014.   Specialty:  Cardiology   Why:  at 12Noon   Contact information:   [place] [address] [phone]       Follow up with Elsie Stain, MD In 1 week.   Specialty:  Family Medicine   Why:  call the office for appointment   Contact information:   [place] [address] [phone]       Duration of Discharge Encounter: Greater than 30 minutes including physician time.  Signed, Chanetta Marshall, NP 08/15/2014 12:51 PM   EP Attending  Patient seen and examined. Discussed the issues with the family. He is improved. He will be treated with systemic anti-coagulation for PAF and he will be given a new prescription for oxygen and lasix.   Mikle Bosworth.D.

## 2014-08-16 ENCOUNTER — Telehealth: Payer: Self-pay | Admitting: Nurse Practitioner

## 2014-08-16 NOTE — Telephone Encounter (Signed)
Will ask triage pool to call patient back tomorrow as I am in the hospital.

## 2014-08-16 NOTE — Telephone Encounter (Signed)
New Message      Pt's daughter in law calling stating that pt has some follow up questions and was told at the hospital that he needs to follow up with Chanetta Marshall. Please call back and advise.

## 2014-08-17 NOTE — Telephone Encounter (Signed)
Notified Ms. Bail that he needs to keep appointment with pulmonary to monitor low oxygen levels while in hospital. She understands and will keep appointment.

## 2014-08-17 NOTE — Telephone Encounter (Signed)
Yes, needs to keep pulmonary appt to follow up on low oxygen levels while in the hospital  Chanetta Marshall, NP 08/17/2014 1:52 PM

## 2014-08-17 NOTE — Telephone Encounter (Signed)
Pt has follow up questions: 1) Pulmonary appt - does patient need to keep that appointment on 4/8 with Tammy Parrett?  Patient is not sure why he is being referred there. 2) Lipitor - is he supposed to start taking it? 3) Feet were swollen yesterday and painful - he took a gout pill. Will that hurt him and is it okay? Colchicine 4) Eliquis - Will need Rx sent to [facility] in [place], [place]  Replied to questions as follows: 1) Will route this question to Chanetta Marshall, NP 2) Yes. Lipitor 40 mg tablet - Take 1 tablet (40 mg total) by mouth daily at 6 PM. 3) Colchicine 0.6 mg tablet was discontinued at discharge. He should not be taking it. Education provided on decreasing gout pain/episodes/refer to PCP for further instruction.  4) Patient stated he can get this during next office visit.

## 2014-08-29 ENCOUNTER — Encounter: Payer: Self-pay | Admitting: Nurse Practitioner

## 2014-08-29 ENCOUNTER — Ambulatory Visit (INDEPENDENT_AMBULATORY_CARE_PROVIDER_SITE_OTHER): Payer: Medicare Other | Admitting: Nurse Practitioner

## 2014-08-29 VITALS — BP 120/58 | HR 72 | Ht 71.0 in | Wt 205.8 lb

## 2014-08-29 DIAGNOSIS — I5033 Acute on chronic diastolic (congestive) heart failure: Secondary | ICD-10-CM | POA: Diagnosis not present

## 2014-08-29 DIAGNOSIS — I442 Atrioventricular block, complete: Secondary | ICD-10-CM | POA: Diagnosis not present

## 2014-08-29 LAB — MDC_IDC_ENUM_SESS_TYPE_INCLINIC
Brady Statistic RA Percent Paced: 14 %
Lead Channel Impedance Value: 490 Ohm
Lead Channel Pacing Threshold Pulse Width: 0.5 ms
Lead Channel Sensing Intrinsic Amplitude: 12 mV
Lead Channel Sensing Intrinsic Amplitude: 3.6 mV
Lead Channel Setting Pacing Amplitude: 3.5 V
Lead Channel Setting Pacing Pulse Width: 0.4 ms
MDC IDC MSMT LEADCHNL RA IMPEDANCE VALUE: 410 Ohm
MDC IDC MSMT LEADCHNL RA PACING THRESHOLD AMPLITUDE: 0.5 V
MDC IDC MSMT LEADCHNL RA PACING THRESHOLD PULSEWIDTH: 0.5 ms
MDC IDC MSMT LEADCHNL RV PACING THRESHOLD AMPLITUDE: 0.375 V
MDC IDC PG SERIAL: 7734710
MDC IDC SET LEADCHNL RV PACING AMPLITUDE: 3.5 V
MDC IDC STAT BRADY RV PERCENT PACED: 99 %

## 2014-08-29 MED ORDER — ROSUVASTATIN CALCIUM 20 MG PO TABS: 20.0000 mg | ORAL_TABLET | Freq: Every day | ORAL | Status: DC

## 2014-08-29 MED ORDER — APIXABAN 2.5 MG PO TABS: 2.5000 mg | ORAL_TABLET | Freq: Two times a day (BID) | ORAL | Status: DC

## 2014-08-29 NOTE — Patient Instructions (Signed)
Your physician has recommended you make the following change in your medication:  1) Start Crestor 20 mg one tablet by mouth once daily  Your physician recommends that you have lab work today: BMP/ [facility] recommends that you follow-up appointment as scheduled:  1) Tuesday 10/16/14 at 3:00 pm- Dr. Meda Coffee 2) Thursday 11/15/14 at 12:00 pm - Dr. Lovena Le  We will call to cancel your pulmonary follow up scheduled for 08/31/14 since your oxygen levels are good now.

## 2014-08-29 NOTE — Progress Notes (Signed)
Electrophysiology Office Note Date: 08/29/2014  ID:  Dean Murphy, DOB 1927-02-16, MRN [id number]  PCP: Elsie Stain, MD Primary Cardiologist: Meda Coffee Electrophysiologist: Lovena Le  CC: Pacemaker post hospital follow-up  Dean Murphy is a 81 y.o. male is seen today for [name].  He presents today for post hospital electrophysiology followup.  He was admitted 08-11-14 through 08-15-14 with complete heart block, acute on chronic renal failure, acute on chronic diastolic heart failure and hypoxia.  He underwent pacemaker implantation and did well post op but required oxygen.  He was seen by pulmonary who recommended short term home O2 with re-evaluation after discharge.  His O2 sats improved prior to discharge and he was not sent home on oxygen.  Since discharge, the patient reports doing very well.  He denies chest pain, palpitations, dyspnea, PND, orthopnea, nausea, vomiting, dizziness, syncope.  He was previously on Crestor and tolerated well, he would like to resume this instead of taking Lipitor.   Device History: STJ dual chamber PPM implanted 07-2014 for complete heart block by [name]   Past Medical History  Diagnosis Date  . Diabetes mellitus   . GERD (gastroesophageal reflux disease)   . Hyperlipidemia   . Hypertension   . Arthritis   . Diverticulosis   . Hemorrhoids   . Colon polyps   . CAD (coronary artery disease)     CABG 1989, Myoview low risk 2012  . CHB (complete heart block) March 2016    a. s/p STJ dual chamber pacemaker  . Osteoporosis   . Chronic kidney disease, stage IV (severe)     stage III/IV as of 2015   Past Surgical History  Procedure Laterality Date  . Coronary artery bypass graft    . Cholecystectomy    . Total knee arthroplasty      bilateral  . Tonsillectomy    . Carotid endarterectomy  12/31/10    Right  . Carotid endarterectomy  02/09/11    left  . Joint replacement      bilat. knees  . Cardiac catheterization  08/11/2014    Procedure:  TEMPORARY PACEMAKER;  Surgeon: [name], MD;  Location: [facility] CATH LAB;  Service: Cardiovascular;;  . Permanent pacemaker insertion N/A 08/13/2014    STJ dual chamber pacemaker implanted by [name] for CHB    Current Outpatient Prescriptions  Medication Sig Dispense Refill  . AMITIZA 8 MCG capsule TAKE 1 CAPSULE DAILY WITH  BREAKFAST 90 capsule 3  . amLODipine (NORVASC) 10 MG tablet Take 1 tablet (10 mg total) by mouth daily. 90 tablet 2  . apixaban (ELIQUIS) 2.5 MG TABS tablet Take 1 tablet (2.5 mg total) by mouth 2 (two) times daily. 60 tablet 1  . aspirin 325 MG tablet Take 325 mg by mouth daily.    [name] atorvastatin (LIPITOR) 40 MG tablet Take 1 tablet (40 mg total) by mouth daily at 6 PM. 30 tablet 1  . benazepril (LOTENSIN) 20 MG tablet Take 1 tablet (20 mg total) by mouth daily. 90 tablet 3  . febuxostat (ULORIC) 40 MG tablet Take 1 tablet (40 mg total) by mouth daily. 90 tablet 3  . furosemide (LASIX) 40 MG tablet Take 1 tablet (40 mg total) by mouth daily as needed. 30 tablet 3  . glipiZIDE (GLUCOTROL) 10 MG tablet Take 1 tablet (10 mg total) by mouth daily before breakfast. 90 tablet 3  . NON FORMULARY daily. careconcepts lancets an d glucometer strips    . omeprazole (  PRILOSEC) 20 MG capsule Take 1 capsule (20 mg total) by mouth daily. 90 capsule 3   No current facility-administered medications for this visit.    Allergies:   Reclast   Social History: History   Social History  . Marital Status: Married    Spouse Name: N/A  . Number of Children: N/A  . Years of Education: N/A   Occupational History  . retired    Social History Main Topics  . Smoking status: Former Smoker -- 1.00 packs/day for 25 years    Types: Cigarettes    Quit date: 01/22/1971  . Smokeless tobacco: Never Used     Comment: began smoking in high school, quit age 42  . Alcohol Use: 0.0 oz/week    0 Standard drinks or equivalent per week     Comment: 1-2 per week  . Drug Use: No  . Sexual  Activity: Not on file   Other Topics Concern  . 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  Remarried 6.     3 sons    Family History: Family History  Problem Relation Age of Onset  . Diabetes    . Cancer    . Heart disease Mother   . Heart attack Father      Review of Systems: All other systems reviewed and are otherwise negative except as noted above.   Physical Exam: VS:  BP 120/58 mmHg  Pulse 72  Ht 5\' 11"  (1.803 m)  Wt 205 lb 12.8 oz (93.35 kg)  BMI 28.72 kg/m2  SpO2 97% , BMI Body mass index is 28.72 kg/(m^2).  GEN- The patient is well appearing, alert and oriented x 3 today.   HEENT: normocephalic, atraumatic; sclera clear, conjunctiva pink; hearing intact; oropharynx clear; neck supple Lungs- Clear to ausculation bilaterally, normal work of breathing.  No wheezes, rales, rhonchi Heart- Regular rate and rhythm, 2/6 SEM GI- soft, non-tender, non-distended, bowel sounds present  Extremities- no clubbing, cyanosis, or edema; DP/PT/radial pulses 2+ bilaterally MS- no significant deformity or atrophy Skin- warm and dry, no rash or lesion; PPM pocket well healed Psych- euthymic mood, full affect Neuro- strength and sensation are intact  PPM Interrogation- reviewed in detail today,  See PACEART report  EKG:  EKG is not ordered today.  Recent Labs: 08/11/2014: ALT 25; B Natriuretic Peptide 684.3* 08/13/2014: TSH 1.582 08/14/2014: Hemoglobin 10.8*; Platelets 122* 08/15/2014: BUN 33*; Creatinine 1.67*; Potassium 4.0; Sodium 141   Wt Readings from Last 3 Encounters:  08/29/14 205 lb 12.8 oz (93.35 kg)  08/15/14 210 lb 12.2 oz (95.6 kg)  06/19/14 209 lb 8 oz (95.029 kg)     Other studies Reviewed: Additional studies/ records that were reviewed today include: hospital records  Assessment and Plan:  1.  Complete heart block Normal PPM function Pacemaker pocket well healed See Pace  Art report No changes today Activity restrictions reviewed with patient  2.  Paroxysmal atrial fibrillation Identified on recent hospital admission AF burden by device interrogation today <1% Continue Eliquis for CHADS2VASC of 5 BMET, CBC today  3.  Acute on chronic renal failure BMET today  4.  Hypoxia O2 sat today 97% with ambulation on room air He did not get O2 at discharge because of improvement in sats - will cancel pulmonary appt for Friday  5.  Acute on chronic diastolic heart failure Improved post pacing Continue medical therapy  5.  CAD No recent ischemic symptoms   Current medicines are reviewed at length with the patient today.   The patient does not have concerns regarding his medicines.  The following changes were made today:  Change Lipitor to Crestor today  Labs/ tests ordered today include: BMET, CBC   Disposition:   Follow up with Dr Meda Coffee in 6 weeks, follow up with [name] in 3 months    Signed, Chanetta Marshall, NP 08/29/2014 4:00 PM  [place] [address] [phone] (office) [phone] (fax)

## 2014-08-30 DIAGNOSIS — H4011X1 Primary open-angle glaucoma, mild stage: Secondary | ICD-10-CM | POA: Diagnosis not present

## 2014-08-30 LAB — BASIC METABOLIC PANEL
BUN: 22 mg/dL (ref 6–23)
CHLORIDE: 108 meq/L (ref 96–112)
CO2: 28 mEq/L (ref 19–32)
Calcium: 9.3 mg/dL (ref 8.4–10.5)
Creatinine, Ser: 1.61 mg/dL — ABNORMAL HIGH (ref 0.40–1.50)
GFR: 43.52 mL/min — ABNORMAL LOW (ref >60.00)
Glucose, Bld: 296 mg/dL — ABNORMAL HIGH (ref 70–99)
POTASSIUM: 4.6 meq/L (ref 3.5–5.1)
Sodium: 139 mEq/L (ref 135–145)

## 2014-08-30 LAB — CBC WITH DIFFERENTIAL/PLATELET
BASOS ABS: 0 10*3/uL (ref 0.0–0.1)
Basophils Relative: 0.6 % (ref 0.0–3.0)
Eosinophils Absolute: 0.2 10*3/uL (ref 0.0–0.7)
Eosinophils Relative: 2.9 % (ref 0.0–5.0)
HCT: 35.7 % — ABNORMAL LOW (ref 39.0–52.0)
Hemoglobin: 11.9 g/dL — ABNORMAL LOW (ref 13.0–17.0)
LYMPHS ABS: 1.6 10*3/uL (ref 0.7–4.0)
LYMPHS PCT: 25.1 % (ref 12.0–46.0)
MCHC: 33.3 g/dL (ref 30.0–36.0)
MCV: 85.6 fl (ref 78.0–100.0)
MONOS PCT: 6.3 % (ref 3.0–12.0)
Monocytes Absolute: 0.4 10*3/uL (ref 0.1–1.0)
Neutro Abs: 4.1 10*3/uL (ref 1.4–7.7)
Neutrophils Relative %: 65.1 % (ref 43.0–77.0)
Platelets: 188 10*3/uL (ref 150.0–400.0)
RBC: 4.17 Mil/uL — ABNORMAL LOW (ref 4.22–5.81)
RDW: 14.1 % (ref 11.5–15.5)
WBC: 6.3 10*3/uL (ref 4.0–10.5)

## 2014-08-31 ENCOUNTER — Inpatient Hospital Stay: Payer: BLUE CROSS/BLUE SHIELD | Admitting: Adult Health

## 2014-09-04 ENCOUNTER — Telehealth: Payer: Self-pay | Admitting: Internal Medicine

## 2014-09-04 ENCOUNTER — Other Ambulatory Visit (INDEPENDENT_AMBULATORY_CARE_PROVIDER_SITE_OTHER): Payer: Medicare Other

## 2014-09-04 DIAGNOSIS — K921 Melena: Secondary | ICD-10-CM

## 2014-09-04 LAB — CBC WITH DIFFERENTIAL/PLATELET
BASOS ABS: 0 10*3/uL (ref 0.0–0.1)
BASOS PCT: 0.6 % (ref 0.0–3.0)
EOS PCT: 2.9 % (ref 0.0–5.0)
Eosinophils Absolute: 0.2 10*3/uL (ref 0.0–0.7)
HEMATOCRIT: 31.5 % — AB (ref 39.0–52.0)
HEMOGLOBIN: 10.5 g/dL — AB (ref 13.0–17.0)
LYMPHS ABS: 1.9 10*3/uL (ref 0.7–4.0)
Lymphocytes Relative: 33.6 % (ref 12.0–46.0)
MCHC: 33.4 g/dL (ref 30.0–36.0)
MCV: 85.9 fl (ref 78.0–100.0)
MONOS PCT: 7.9 % (ref 3.0–12.0)
Monocytes Absolute: 0.4 10*3/uL (ref 0.1–1.0)
NEUTROS ABS: 3.1 10*3/uL (ref 1.4–7.7)
Neutrophils Relative %: 55 % (ref 43.0–77.0)
Platelets: 176 10*3/uL (ref 150.0–400.0)
RBC: 3.67 Mil/uL — AB (ref 4.22–5.81)
RDW: 14.6 % (ref 11.5–15.5)
WBC: 5.7 10*3/uL (ref 4.0–10.5)

## 2014-09-04 NOTE — Telephone Encounter (Signed)
New message      Pt says his blood sugar is going up and up.  His stools are real dark.  He recently started crestor and eliquis.  His blood sugar today is 215.  Could it be the crestor or eliquis making his blood sugar go up?

## 2014-09-04 NOTE — Telephone Encounter (Signed)
Says his stools have been dark for 3 days or more.  Discussed with [name], will obtain a CBC today and have him hold his Eliquis until I speak with him on Thurs.  He says he feels good but is just concerned because of the dark stools

## 2014-09-04 NOTE — Telephone Encounter (Signed)
Discussed with [name] after review of his CBC  HbG is 10.3  He can stay off Eliquis until stools are better and call his PCP

## 2014-09-07 ENCOUNTER — Telehealth: Payer: Self-pay | Admitting: Family Medicine

## 2014-09-07 ENCOUNTER — Ambulatory Visit (INDEPENDENT_AMBULATORY_CARE_PROVIDER_SITE_OTHER): Payer: Medicare Other | Admitting: Primary Care

## 2014-09-07 ENCOUNTER — Encounter: Payer: Self-pay | Admitting: Gastroenterology

## 2014-09-07 ENCOUNTER — Encounter: Payer: Self-pay | Admitting: Primary Care

## 2014-09-07 VITALS — BP 144/64 | HR 84 | Temp 97.7°F | Ht 71.0 in | Wt 205.4 lb

## 2014-09-07 DIAGNOSIS — R739 Hyperglycemia, unspecified: Secondary | ICD-10-CM

## 2014-09-07 DIAGNOSIS — N189 Chronic kidney disease, unspecified: Secondary | ICD-10-CM | POA: Diagnosis not present

## 2014-09-07 DIAGNOSIS — K274 Chronic or unspecified peptic ulcer, site unspecified, with hemorrhage: Secondary | ICD-10-CM | POA: Diagnosis not present

## 2014-09-07 DIAGNOSIS — E1122 Type 2 diabetes mellitus with diabetic chronic kidney disease: Secondary | ICD-10-CM

## 2014-09-07 LAB — GLUCOSE, POCT (MANUAL RESULT ENTRY): POC Glucose: 318 mg/dl — AB (ref 70–99)

## 2014-09-07 MED ORDER — OMEPRAZOLE 40 MG PO CPDR: 40.0000 mg | DELAYED_RELEASE_CAPSULE | Freq: Every day | ORAL | Status: DC

## 2014-09-07 MED ORDER — SUCRALFATE 1 GM/10ML PO SUSP: 1.0000 g | Freq: Three times a day (TID) | ORAL | Status: DC

## 2014-09-07 NOTE — Telephone Encounter (Signed)
[facility] Patient Name: Dean Murphy DOB: July 15, 1926 Initial Comment Caller says he heart attack and was in the hospital. Has been home for two weeks. His stools are black and his sugar is in the 200 and 300. Sugar was 239 at 7:30 AM, and he is a bit nauseated right now. Nurse Assessment Nurse: Markus Daft, RN, Sherre Poot Date/Time (Eastern Time): 09/07/2014 10:29:56 AM Confirm and document reason for call. If symptomatic, describe symptoms. ---1) Caller states that his stools are black. Noticed in the last week. A little dizzy with standing. Stopped Eliquis and Crestor on Tuesday after speaking with cardiologist. 2) His sugar is in the 200 and 300. Sugar was 239 at 7:00 AM, and he is a bit nauseated right now. Denies CP, difficulty breathing. -- Sates that he had MI on end of March, and given a PPM on 08/13/14 while admitted. Home on Wednesday 08/23/14, and home for 3 wks. now. Requesting appt with PCP. Has the patient traveled out of the country within the last 30 days? ---Not Applicable Does the patient require triage? ---Yes Related visit to physician within the last 2 weeks? ---Yes Does the PT have any chronic conditions? (i.e. diabetes, asthma, etc.) ---Yes List chronic conditions. ---NIDDM, MI, CAD, PPM 2016 Guidelines Guideline Title Affirmed Question Affirmed Notes Rectal Bleeding Tarry or jet black-colored stool (not dark green) Final Disposition User Go to ED Now Markus Daft, RN, Windy Comments Pt. refused going to ER. He insisted on seeing MD in office instead. He is aware that someone will call him back today from the office.

## 2014-09-07 NOTE — Patient Instructions (Addendum)
It appears that you have an upper gastrointestinal bleed. Hold your Aspirin. Start taking Omeprazole to 40mg  daily. Take the Carafate suspension four times daily (with meals and at bedtime). Limit your intake of carbohydrates and sugary foods as discussed to keep your blood sugar from elevated. Come in Monday for a repeat of your blood counts. Go to the emergency department if you start to feel weak, dizzy, short of breath, or develop chest pain.

## 2014-09-07 NOTE — Telephone Encounter (Signed)
Spoke with the patient and let him know he needs to follow up Dr Damita Dunnings.  He had a colonoscopy 5 years ago and it was good. I have instructed him to stay off the elquis for now and I will touch base with him to see what his PCP says

## 2014-09-07 NOTE — Telephone Encounter (Signed)
Called patient and schedule an appt with Allie Bossier at 1pm, 09/07/14

## 2014-09-07 NOTE — Progress Notes (Signed)
Pre visit review using our clinic review tool, if applicable. No additional management support is needed unless otherwise documented below in the visit note. 

## 2014-09-07 NOTE — Assessment & Plan Note (Signed)
Highly suspect upper GI bleed due to presence of dark stools, low hemoglobin, and weakness. He looks well, skin has plenty of color.  Instructed patient to stop his Aspirin.  RX for Omeprazole, Carafate. Repeat CBC on Monday, follow up on Wednesday next week. Referral for urgent endoscopy.

## 2014-09-07 NOTE — Telephone Encounter (Signed)
Allie Bossier NP has already seen previous TH note and pt is scheduled to see Allie Bossier today at 1 PM.

## 2014-09-07 NOTE — Assessment & Plan Note (Signed)
CBG 310 today. Educated patient on proper diabetes diet and to limit his carbs and sugars. No changes to medications due to his acute issues today. Will recheck sugar next week at follow up appointment.

## 2014-09-07 NOTE — Telephone Encounter (Signed)
Follow up      Pt is calling to get an update on his eliquis and dark stool.  He also says his blood sugar is 230 and he needs to talk to someone today.

## 2014-09-07 NOTE — Progress Notes (Signed)
Subjective:    Patient ID: Dean Murphy, male    DOB: 05/01/1927, 81 y.o.   MRN: [id number]  HPI  Mr. Esguerra is an 81 year old male with a chief complaint of black stools and generalized weakness for the past week. He has a history of MI on the 19th of March which resulted in a permanent pacemaker. He was started on Eliquis and Crestor in the hospital. A few weeks later he noticed a change in the color of his bowels without improvement since. He was removed from his Eliquis and Crestor. He continued to take his 324mg  daily Aspirin. He has no pain, nausea, vomiting, and is eating well. His hemoglobin has been checked several times and is lower.   2) Hyperglycemia: Believes this to be due to his Eliquis and Crestor. He admits to eating blueberry muffins, Nab crackers, frosted flakes, etc regularly. His blood sugar in the office today is 310, and reports that's about what he's getting at home when he checks.   Review of Systems  Respiratory: Negative for shortness of breath.   Cardiovascular: Negative for chest pain.  Gastrointestinal: Negative for abdominal pain, diarrhea and constipation.       Reports black stools. Denies seeing bright red blood in the toilet.  Genitourinary: Negative for dysuria and frequency.  Neurological: Positive for weakness. Negative for dizziness and light-headedness.       Slight numbness to fingers and toes x 1 week.       Past Medical History  Diagnosis Date  . Diabetes mellitus   . GERD (gastroesophageal reflux disease)   . Hyperlipidemia   . Hypertension   . Arthritis   . Diverticulosis   . Hemorrhoids   . Colon polyps   . CAD (coronary artery disease)     CABG 1989, Myoview low risk 2012  . CHB (complete heart block) March 2016    a. s/p STJ dual chamber pacemaker  . Osteoporosis   . Chronic kidney disease, stage IV (severe)     stage III/IV as of 2015    History   Social History  . Marital Status: Married    Spouse Name: N/A  . Number of  Children: N/A  . Years of Education: N/A   Occupational History  . retired    Social History Main Topics  . Smoking status: Former Smoker -- 1.00 packs/day for 25 years    Types: Cigarettes    Quit date: 01/22/1971  . Smokeless tobacco: Never Used     Comment: began smoking in high school, quit age 37  . Alcohol Use: 0.0 oz/week    0 Standard drinks or equivalent per week     Comment: 1-2 per week  . Drug Use: No  . Sexual Activity: Not on file   Other Topics Concern  . 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  Remarried 1998.     3 sons    Past Surgical History  Procedure Laterality Date  . Coronary artery bypass graft    . Cholecystectomy    . Total knee arthroplasty      bilateral  . Tonsillectomy    . Carotid endarterectomy  12/31/10    Right  . Carotid endarterectomy  02/09/11    left  . Joint replacement      bilat. knees  . Cardiac catheterization  08/11/2014    Procedure:  TEMPORARY PACEMAKER;  Surgeon: [name], MD;  Location: [facility] CATH LAB;  Service: Cardiovascular;;  . Permanent pacemaker insertion N/A 08/13/2014    STJ dual chamber pacemaker implanted by [name] for CHB    Family History  Problem Relation Age of Onset  . Diabetes    . Cancer    . Heart disease Mother   . Heart attack Father     Allergies  Allergen Reactions  . Reclast [Zoledronic Acid]     Stopped 2015 due to Cr    Current Outpatient Prescriptions on File Prior to Visit  Medication Sig Dispense Refill  . AMITIZA 8 MCG capsule TAKE 1 CAPSULE DAILY WITH  BREAKFAST 90 capsule 3  . amLODipine (NORVASC) 10 MG tablet Take 1 tablet (10 mg total) by mouth daily. 90 tablet 2  . aspirin 325 MG tablet Take 325 mg by mouth daily.    . benazepril (LOTENSIN) 20 MG tablet Take 1 tablet (20 mg total) by mouth daily. 90 tablet 3  . febuxostat (ULORIC) 40 MG tablet Take 1 tablet (40 mg total) by mouth daily. 90  tablet 3  . furosemide (LASIX) 40 MG tablet Take 1 tablet (40 mg total) by mouth daily as needed. 30 tablet 3  . glipiZIDE (GLUCOTROL) 10 MG tablet Take 1 tablet (10 mg total) by mouth daily before breakfast. 90 tablet 3  . NON FORMULARY daily. careconcepts lancets an d glucometer strips    . apixaban (ELIQUIS) 2.5 MG TABS tablet Take 1 tablet (2.5 mg total) by mouth 2 (two) times daily. (Patient not taking: Reported on 09/07/2014) 180 tablet 3  . rosuvastatin (CRESTOR) 20 MG tablet Take 1 tablet (20 mg total) by mouth daily. (Patient not taking: Reported on 09/07/2014) 90 tablet 3   No current facility-administered medications on file prior to visit.    BP 144/64 mmHg  Pulse 84  Temp(Src) 97.7 F (36.5 C) (Oral)  Ht 5\' 11"  (1.803 m)  Wt 205 lb 6.4 oz (93.169 kg)  BMI 28.66 kg/m2  SpO2 93%    Objective:   Physical Exam  Constitutional: He is oriented to person, place, and time. He appears well-developed.  Appears well.  Neck: Neck supple.  Cardiovascular: Normal rate and regular rhythm.   Murmur heard. Pulmonary/Chest: Effort normal and breath sounds normal.  Abdominal: Soft. Bowel sounds are normal. He exhibits no distension and no mass. There is no tenderness.  Lymphadenopathy:    He has no cervical adenopathy.  Neurological: He is alert and oriented to person, place, and time.  Skin: Skin is warm and dry.  Psychiatric: He has a normal mood and affect.          Assessment & Plan:

## 2014-09-07 NOTE — Telephone Encounter (Signed)
[facility]  Patient Name: Dean Murphy  DOB: [birth year]    Initial Comment Caller states he had pacemaker put in3/21, not feeling well, stools black. Blood sugar has been up, now 239. Dizzy.   Nurse Assessment  Nurse: Mechele Dawley, RN, Amy Date/Time Eilene Ghazi Time): 09/07/2014 11:16:48 AM  Confirm and document reason for call. If symptomatic, describe symptoms. ---CALLER STATES THAT HIS BLOOD SUGAR IS OUT OF CONTROL. BS THIS MORNING AT 0700 WAS 239. HE NORMALLY RUNS BETWEEN 120-130. HE DENIES ANY CHANGES IN HIS DIET. HE IS NOT FEELING WELL. HIS STOOLS ARE BLACK. WHEN HE GOT HOME FROM THE HOSPITAL AFTER THE PACER PLACED THE STOOLS HAVE BEEN BLACK AND IT IS REALLY BLACK NOW. HIS BMS ARE BLACK EVERY TIME THAT HE GOES. HE IS HAVING SOME DIZZINESS AND HIS ARMS AND HANDS FEEL A LITTLE NUMB.  Has the patient traveled out of the country within the last 30 days? ---Not Applicable  Does the patient require triage? ---Yes  Related visit to physician within the last 2 weeks? ---Yes  Does the PT have any chronic conditions? (i.e. diabetes, asthma, etc.) ---Yes  List chronic conditions. ---PACER, DIABETES,     Guidelines    Guideline Title Affirmed Question Affirmed Notes  Diabetes - High Blood Sugar Patient sounds very sick or weak to the triager ELEVATED BS ARE OUT OF CONTROL   Final Disposition User   Go to ED Now (or PCP triage) Mechele Dawley, RN, Amy    Comments  [name] WAS VERY APPREHENSIVE REGARDING GOING INTO THE ED. INFORMED HIM TO TAKE HIS READINGS OF HIS BS LEVELS OVER THE PAST COUPLE OF WEEKS. HE STATES THEY ARE ELEVATED ABOVE HIS NORMAL READINGS AND HE JUST DOES NOT KNOW WHY. HE CAN'T FIGURE OUT WHAT IS GOING ON. ALSO INSTRUCTED HIM TO MAKE SURE THAT HE ADDRESSES THE ISSUE WITH THE ED REGARDING HIS BLACK STOOLS. HE STATES THAT SINCE HE HAS BEEN HOME FROM THE PACER PLACEMENT HE HAS BEEN HAVING BLACK STOOLS AND THEY ARE GETTING WORSE EVERY DAY. HE STATES HE WILL BE THERE ALL DAY LONG AND  JUST REALLY DID NOT WANT TO GO. INSTRUCTED HIM THAT DUE TO ALL OF THIS THAT HE NEEDS TO GO ON INTO THE ED AND MUST BE SEEN. HE REALIZES THIS AND IS WILLING TO GO ON.

## 2014-09-10 ENCOUNTER — Telehealth: Payer: Self-pay | Admitting: Primary Care

## 2014-09-10 ENCOUNTER — Other Ambulatory Visit (INDEPENDENT_AMBULATORY_CARE_PROVIDER_SITE_OTHER): Payer: Medicare Other

## 2014-09-10 DIAGNOSIS — K274 Chronic or unspecified peptic ulcer, site unspecified, with hemorrhage: Secondary | ICD-10-CM | POA: Diagnosis not present

## 2014-09-10 LAB — CBC WITH DIFFERENTIAL/PLATELET
Basophils Absolute: 0 10*3/uL (ref 0.0–0.1)
Basophils Relative: 0.6 % (ref 0.0–3.0)
EOS ABS: 0.2 10*3/uL (ref 0.0–0.7)
EOS PCT: 3.6 % (ref 0.0–5.0)
HEMATOCRIT: 30.2 % — AB (ref 39.0–52.0)
Hemoglobin: 10 g/dL — ABNORMAL LOW (ref 13.0–17.0)
Lymphocytes Relative: 39.6 % (ref 12.0–46.0)
Lymphs Abs: 2.4 10*3/uL (ref 0.7–4.0)
MCHC: 33.2 g/dL (ref 30.0–36.0)
MCV: 85.4 fl (ref 78.0–100.0)
Monocytes Absolute: 0.4 10*3/uL (ref 0.1–1.0)
Monocytes Relative: 7.1 % (ref 3.0–12.0)
Neutro Abs: 2.9 10*3/uL (ref 1.4–7.7)
Neutrophils Relative %: 49.1 % (ref 43.0–77.0)
Platelets: 151 10*3/uL (ref 150.0–400.0)
RBC: 3.54 Mil/uL — ABNORMAL LOW (ref 4.22–5.81)
RDW: 14.6 % (ref 11.5–15.5)
WBC: 6 10*3/uL (ref 4.0–10.5)

## 2014-09-10 NOTE — Telephone Encounter (Signed)
Followed up with Dean Murphy who reports his bowel color has returned to normal this morning, feels well, still feels tired occasionally. He is to follow up in the office for re-evaluation this Wednesday and for repeat CBC. Denies complaints over the weekend.

## 2014-09-10 NOTE — Telephone Encounter (Signed)
Saw PA on Fri and he has a follow up  labs today and appointment for follow appointment Wed.  He was started on  Omeprazole 20 mg daily and Carafate 100 mg 2 tsp qid.  He will let me know if he needs anything else

## 2014-09-12 ENCOUNTER — Other Ambulatory Visit: Payer: Self-pay | Admitting: Primary Care

## 2014-09-12 ENCOUNTER — Encounter: Payer: Self-pay | Admitting: Primary Care

## 2014-09-12 ENCOUNTER — Ambulatory Visit (INDEPENDENT_AMBULATORY_CARE_PROVIDER_SITE_OTHER): Payer: Medicare Other | Admitting: Primary Care

## 2014-09-12 VITALS — BP 140/60 | HR 71 | Temp 98.4°F | Ht 71.0 in | Wt 204.4 lb

## 2014-09-12 DIAGNOSIS — K274 Chronic or unspecified peptic ulcer, site unspecified, with hemorrhage: Secondary | ICD-10-CM | POA: Diagnosis not present

## 2014-09-12 DIAGNOSIS — K922 Gastrointestinal hemorrhage, unspecified: Secondary | ICD-10-CM

## 2014-09-12 LAB — CBC WITH DIFFERENTIAL/PLATELET
BASOS PCT: 0.5 % (ref 0.0–3.0)
Basophils Absolute: 0 10*3/uL (ref 0.0–0.1)
EOS PCT: 2.6 % (ref 0.0–5.0)
Eosinophils Absolute: 0.1 10*3/uL (ref 0.0–0.7)
HCT: 28.9 % — ABNORMAL LOW (ref 39.0–52.0)
Hemoglobin: 9.8 g/dL — ABNORMAL LOW (ref 13.0–17.0)
LYMPHS PCT: 30.7 % (ref 12.0–46.0)
Lymphs Abs: 1.4 10*3/uL (ref 0.7–4.0)
MCHC: 33.9 g/dL (ref 30.0–36.0)
MCV: 84.9 fl (ref 78.0–100.0)
MONO ABS: 0.4 10*3/uL (ref 0.1–1.0)
Monocytes Relative: 9 % (ref 3.0–12.0)
NEUTROS ABS: 2.7 10*3/uL (ref 1.4–7.7)
NEUTROS PCT: 57.2 % (ref 43.0–77.0)
Platelets: 132 10*3/uL — ABNORMAL LOW (ref 150.0–400.0)
RBC: 3.4 Mil/uL — ABNORMAL LOW (ref 4.22–5.81)
RDW: 14.8 % (ref 11.5–15.5)
WBC: 4.7 10*3/uL (ref 4.0–10.5)

## 2014-09-12 NOTE — Progress Notes (Signed)
Subjective:    Patient ID: Dean Murphy, male    DOB: 1926/10/18, 81 y.o.   MRN: [id number]  HPI  Dean Murphy is an 81 year old male who presents today for follow up. He was evaluated last Friday and determined to have an active GI bleed. He was having dark stools and generalized weakness for one week. He was removed from his Eliquis and Crestor but was still taking his Aspirin 324mg . His hemoglobin on 4/12 was 10.5 and 4/18 10.0. On Monday he reports his stools returned to normal color and have remained that way today. He has no weakaness or dizziness and did not experience any problems over the weekend. Today he feels much better and is gradually regaining strength. He has been taking the carafate and omeprazole as directed. He has reduced his aspirin to 81 mg daily as discussed. Has a meeting with GI next week but reports needing to reschedule due to a personal conflict.  Review of Systems  Respiratory: Negative for shortness of breath.   Cardiovascular: Negative for chest pain.  Gastrointestinal: Negative for nausea, vomiting, abdominal pain, diarrhea, constipation and blood in stool.  Skin:       Improvement in skin color.  Neurological: Negative for dizziness and headaches.       Tingling to finger tips. History of diabetes.       Past Medical History  Diagnosis Date  . Diabetes mellitus   . GERD (gastroesophageal reflux disease)   . Hyperlipidemia   . Hypertension   . Arthritis   . Diverticulosis   . Hemorrhoids   . Colon polyps   . CAD (coronary artery disease)     CABG 1989, Myoview low risk 2012  . CHB (complete heart block) March 2016    a. s/p STJ dual chamber pacemaker  . Osteoporosis   . Chronic kidney disease, stage IV (severe)     stage III/IV as of 2015    History   Social History  . Marital Status: Married    Spouse Name: N/A  . Number of Children: N/A  . Years of Education: N/A   Occupational History  . retired    Social History Main Topics  .  Smoking status: Former Smoker -- 1.00 packs/day for 25 years    Types: Cigarettes    Quit date: 01/22/1971  . Smokeless tobacco: Never Used     Comment: began smoking in high school, quit age 24  . Alcohol Use: 0.0 oz/week    0 Standard drinks or equivalent per week     Comment: 1-2 per week  . Drug Use: No  . Sexual Activity: Not on file   Other Topics Concern  . 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  Remarried 1998.     3 sons    Past Surgical History  Procedure Laterality Date  . Coronary artery bypass graft    . Cholecystectomy    . Total knee arthroplasty      bilateral  . Tonsillectomy    . Carotid endarterectomy  12/31/10    Right  . Carotid endarterectomy  02/09/11    left  . Joint replacement      bilat. knees  . Cardiac catheterization  08/11/2014    Procedure: TEMPORARY PACEMAKER;  Surgeon: [name], MD;  Location: [facility] CATH LAB;  Service: Cardiovascular;;  . Permanent pacemaker insertion  N/A 08/13/2014    STJ dual chamber pacemaker implanted by [name] for CHB    Family History  Problem Relation Age of Onset  . Diabetes    . Cancer    . Heart disease Mother   . Heart attack Father     Allergies  Allergen Reactions  . Reclast [Zoledronic Acid]     Stopped 2015 due to Cr    Current Outpatient Prescriptions on File Prior to Visit  Medication Sig Dispense Refill  . AMITIZA 8 MCG capsule TAKE 1 CAPSULE DAILY WITH  BREAKFAST 90 capsule 3  . amLODipine (NORVASC) 10 MG tablet Take 1 tablet (10 mg total) by mouth daily. 90 tablet 2  . benazepril (LOTENSIN) 20 MG tablet Take 1 tablet (20 mg total) by mouth daily. 90 tablet 3  . febuxostat (ULORIC) 40 MG tablet Take 1 tablet (40 mg total) by mouth daily. 90 tablet 3  . furosemide (LASIX) 40 MG tablet Take 1 tablet (40 mg total) by mouth daily as needed. 30 tablet 3  . glipiZIDE (GLUCOTROL) 10 MG tablet Take 1 tablet (10 mg  total) by mouth daily before breakfast. 90 tablet 3  . NON FORMULARY daily. careconcepts lancets an d glucometer strips    . omeprazole (PRILOSEC) 40 MG capsule Take 1 capsule (40 mg total) by mouth daily. 30 capsule 11  . sucralfate (CARAFATE) 1 GM/10ML suspension Take 10 mLs (1 g total) by mouth 4 (four) times daily -  with meals and at bedtime. 420 mL 0  . aspirin 325 MG tablet Take 325 mg by mouth daily.     No current facility-administered medications on file prior to visit.    BP 140/60 mmHg  Pulse 71  Temp(Src) 98.4 F (36.9 C) (Oral)  Ht 5\' 11"  (1.803 m)  Wt 204 lb 6.4 oz (92.715 kg)  BMI 28.52 kg/m2  SpO2 98%    Objective:   Physical Exam  Constitutional: He is oriented to person, place, and time. He appears well-developed.  Appears well, good skin color.  Neck: Neck supple.  Cardiovascular: Normal rate and regular rhythm.   Pulmonary/Chest: Effort normal and breath sounds normal.  Abdominal: Soft. Bowel sounds are normal. He exhibits no mass. There is no tenderness.  Lymphadenopathy:    He has no cervical adenopathy.  Neurological: He is alert and oriented to person, place, and time.  Skin: Skin is warm and dry. No pallor.  Psychiatric: He has a normal mood and affect.          Assessment & Plan:

## 2014-09-12 NOTE — Assessment & Plan Note (Signed)
Much improved from Friday. Stools have returned to normal color. Taking carafate and omeprazole. Repeat CBC today.  Follow up with GI as discussed. He is to call if he notices a change in his bowels, develops abdominal pain, nausea, vomiting, etc.

## 2014-09-12 NOTE — Progress Notes (Signed)
Pre visit review using our clinic review tool, if applicable. No additional management support is needed unless otherwise documented below in the visit note. 

## 2014-09-12 NOTE — Patient Instructions (Signed)
Complete lab work prior to leaving today. I will notify you of your results. Continue the Carafate until complete. Continue your Omeprazole daily. Start Aspirin 81mg  tablets again. Call me if you develop dark or bright red stools, fevers, weakness, or become pale. Follow up with Dr. Damita Dunnings as scheduled. It was a pleasure meeting you! Take care!

## 2014-09-18 ENCOUNTER — Ambulatory Visit: Payer: Medicare Other | Admitting: Nurse Practitioner

## 2014-09-19 ENCOUNTER — Other Ambulatory Visit (INDEPENDENT_AMBULATORY_CARE_PROVIDER_SITE_OTHER): Payer: Medicare Other

## 2014-09-19 DIAGNOSIS — K274 Chronic or unspecified peptic ulcer, site unspecified, with hemorrhage: Secondary | ICD-10-CM | POA: Diagnosis not present

## 2014-09-19 LAB — CBC WITH DIFFERENTIAL/PLATELET
Basophils Absolute: 0 10*3/uL (ref 0.0–0.1)
Basophils Relative: 0.7 % (ref 0.0–3.0)
EOS PCT: 3.1 % (ref 0.0–5.0)
Eosinophils Absolute: 0.2 10*3/uL (ref 0.0–0.7)
HCT: 28.8 % — ABNORMAL LOW (ref 39.0–52.0)
HEMOGLOBIN: 9.5 g/dL — AB (ref 13.0–17.0)
LYMPHS ABS: 2 10*3/uL (ref 0.7–4.0)
LYMPHS PCT: 35.6 % (ref 12.0–46.0)
MCHC: 33 g/dL (ref 30.0–36.0)
MCV: 84.9 fl (ref 78.0–100.0)
Monocytes Absolute: 0.4 10*3/uL (ref 0.1–1.0)
Monocytes Relative: 7.5 % (ref 3.0–12.0)
Neutro Abs: 2.9 10*3/uL (ref 1.4–7.7)
Neutrophils Relative %: 53.1 % (ref 43.0–77.0)
PLATELETS: 162 10*3/uL (ref 150.0–400.0)
RBC: 3.39 Mil/uL — AB (ref 4.22–5.81)
RDW: 14.9 % (ref 11.5–15.5)
WBC: 5.6 10*3/uL (ref 4.0–10.5)

## 2014-09-20 ENCOUNTER — Encounter: Payer: Self-pay | Admitting: Internal Medicine

## 2014-09-27 ENCOUNTER — Encounter: Payer: Self-pay | Admitting: Nurse Practitioner

## 2014-09-27 ENCOUNTER — Ambulatory Visit (INDEPENDENT_AMBULATORY_CARE_PROVIDER_SITE_OTHER): Payer: Medicare Other | Admitting: Nurse Practitioner

## 2014-09-27 ENCOUNTER — Other Ambulatory Visit (INDEPENDENT_AMBULATORY_CARE_PROVIDER_SITE_OTHER): Payer: Medicare Other

## 2014-09-27 VITALS — BP 132/52 | HR 80 | Ht 68.5 in | Wt 204.0 lb

## 2014-09-27 DIAGNOSIS — D649 Anemia, unspecified: Secondary | ICD-10-CM

## 2014-09-27 LAB — FERRITIN: Ferritin: 13.6 ng/mL — ABNORMAL LOW (ref 22.0–322.0)

## 2014-09-27 LAB — CBC
HCT: 32 % — ABNORMAL LOW (ref 39.0–52.0)
Hemoglobin: 10.7 g/dL — ABNORMAL LOW (ref 13.0–17.0)
MCHC: 33.4 g/dL (ref 30.0–36.0)
MCV: 83.3 fl (ref 78.0–100.0)
Platelets: 150 10*3/uL (ref 150.0–400.0)
RBC: 3.85 Mil/uL — ABNORMAL LOW (ref 4.22–5.81)
RDW: 13.7 % (ref 11.5–15.5)
WBC: 6 10*3/uL (ref 4.0–10.5)

## 2014-09-27 LAB — IBC PANEL
Iron: 20 ug/dL — ABNORMAL LOW (ref 42–165)
SATURATION RATIOS: 3.6 % — AB (ref 20.0–50.0)
TRANSFERRIN: 393 mg/dL — AB (ref 212.0–360.0)

## 2014-09-27 NOTE — Patient Instructions (Addendum)
Your physician has requested that you go to the basement for the following lab work before leaving today: CBC, IBC, Ferritin  You have been scheduled for an endoscopy. Please follow written instructions given to you at your visit today. If you use inhalers (even only as needed), please bring them with you on the day of your procedure.

## 2014-09-27 NOTE — Progress Notes (Signed)
HPI :  Patient is an 81 year old male known remotely to Dr. Fuller Plan. He has a history of diverticulosis, colon polyps and internal hemorrhoids. Patient is referred by PCP for evaluation of anemia and black stools. In March patient had a pacemaker placed around which time he was also started on Eliquis. Almost immediately patient began having black stools. Baseline hemoglobin nearly 14, it declined to 10.8. Most recent hemoglobin 09/12/14 was 9.8. Patient states he took Eliquis folio week or so. After discontinuation of the medication black stools resolved. He's had no black stools and over 2 weeks now. Patient had no associated abdominal pain, nausea or vomiting. He was on a full aspirin plus a PPI at home. Patient is now on a baby aspirin. He is supposed to see cardiology in June.  Past Medical History  Diagnosis Date  . Diabetes mellitus   . GERD (gastroesophageal reflux disease)   . Hyperlipidemia   . Hypertension   . Arthritis   . Diverticulosis   . Hemorrhoids   . Colon polyps   . CAD (coronary artery disease)     CABG 1989, Myoview low risk 2012  . CHB (complete heart block) March 2016    a. s/p STJ dual chamber pacemaker  . Osteoporosis   . Chronic kidney disease, stage IV (severe)     stage III/IV as of 2015    Family History  Problem Relation Age of Onset  . Diabetes    . Cancer    . Heart disease Mother   . Heart attack Father    History  Substance Use Topics  . Smoking status: Former Smoker -- 1.00 packs/day for 25 years    Types: Cigarettes    Quit date: 01/22/1971  . Smokeless tobacco: Never Used     Comment: began smoking in high school, quit age 57  . Alcohol Use: 0.0 oz/week    0 Standard drinks or equivalent per week     Comment: 1-2 per week   Current Outpatient Prescriptions  Medication Sig Dispense Refill  . AMITIZA 8 MCG capsule TAKE 1 CAPSULE DAILY WITH  BREAKFAST 90 capsule 3  . amLODipine (NORVASC) 10 MG tablet Take 1 tablet (10 mg total) by mouth  daily. 90 tablet 2  . aspirin 81 MG tablet Take 81 mg by mouth daily.    . benazepril (LOTENSIN) 20 MG tablet Take 1 tablet (20 mg total) by mouth daily. 90 tablet 3  . febuxostat (ULORIC) 40 MG tablet Take 1 tablet (40 mg total) by mouth daily. 90 tablet 3  . furosemide (LASIX) 40 MG tablet Take 1 tablet (40 mg total) by mouth daily as needed. 30 tablet 3  . glipiZIDE (GLUCOTROL) 10 MG tablet Take 1 tablet (10 mg total) by mouth daily before breakfast. 90 tablet 3  . NON FORMULARY daily. careconcepts lancets an d glucometer strips    . omeprazole (PRILOSEC) 40 MG capsule Take 1 capsule (40 mg total) by mouth daily. 30 capsule 11  . sucralfate (CARAFATE) 1 GM/10ML suspension Take 10 mLs (1 g total) by mouth 4 (four) times daily -  with meals and at bedtime. 420 mL 0   No current facility-administered medications for this visit.   Allergies  Allergen Reactions  . Reclast [Zoledronic Acid]     Stopped 2015 due to Cr     Review of Systems: All systems reviewed and negative except where noted in HPI.   Physical Exam: BP 132/52 mmHg  Pulse 80  Ht  5' 8.5" (1.74 m)  Wt 204 lb (92.534 kg)  BMI 30.56 kg/m2 Constitutional: Pleasant,well-developed, white male in no acute distress. HEENT: Normocephalic and atraumatic. Conjunctivae are normal. No scleral icterus. Neck supple.  Cardiovascular: Normal rate, regular rhythm.  Pulmonary/chest: Effort normal and breath sounds normal. No wheezing, rales or rhonchi. Abdominal: Soft, nondistended, nontender. Bowel sounds active throughout. There are no masses palpable. No hepatomegaly. Rectal: light brown, heme negative stool Extremities: no edema Lymphadenopathy: No cervical adenopathy noted. Neurological: Alert and oriented to person place and time. Skin: Skin is warm and dry. No rashes noted. Psychiatric: Normal mood and affect. Behavior is normal.   ASSESSMENT AND PLAN:  62. 81 year old male with black stools and hgb drop shortly after  starting Eliquis.  Eliquis discontinued 3 weeks ago, no bleeding since. Patient has no associated gastrointestinal symptoms, he actually feels great. Patient had been on a full aspirin at home plus a PPI. Now on a baby aspirin plus PPI. Stool light brown, heme-negative today. We discussed options such as a watch and wait approach if hemoglobin has started to improve and no further bleeding. On the other hand, patient may need to restart Eliquis or similar type medication which could put him at risk for recurrent bleeding. Patient feels and looks quite healthy, I think it is reasonable to proceed with an upper endoscopy, patient is agreeable. The benefits, risks, and potential complications of EGD with possible biopsies were discussed with the patient and he agrees to proceed. Will check CBC, iron studies and call patient with results  2. Multiple medical problems not limited to DM, HTN, CAD, permanent pacemaker, CKD. Echo 2016 - EF 60%

## 2014-09-28 NOTE — Progress Notes (Signed)
Reviewed and agree with management plan.  Laira Penninger T. Grabiela Wohlford, MD FACG 

## 2014-10-01 ENCOUNTER — Encounter: Payer: Self-pay | Admitting: Gastroenterology

## 2014-10-01 ENCOUNTER — Ambulatory Visit (AMBULATORY_SURGERY_CENTER): Payer: Medicare Other | Admitting: Gastroenterology

## 2014-10-01 VITALS — BP 138/65 | HR 71 | Temp 97.3°F | Resp 18 | Ht 68.0 in | Wt 204.0 lb

## 2014-10-01 DIAGNOSIS — N189 Chronic kidney disease, unspecified: Secondary | ICD-10-CM | POA: Diagnosis not present

## 2014-10-01 DIAGNOSIS — I10 Essential (primary) hypertension: Secondary | ICD-10-CM | POA: Diagnosis not present

## 2014-10-01 DIAGNOSIS — E119 Type 2 diabetes mellitus without complications: Secondary | ICD-10-CM | POA: Diagnosis not present

## 2014-10-01 DIAGNOSIS — K921 Melena: Secondary | ICD-10-CM | POA: Diagnosis not present

## 2014-10-01 DIAGNOSIS — D649 Anemia, unspecified: Secondary | ICD-10-CM | POA: Diagnosis not present

## 2014-10-01 LAB — GLUCOSE, CAPILLARY
Glucose-Capillary: 146 mg/dL — ABNORMAL HIGH (ref 70–99)
Glucose-Capillary: 128 mg/dL — ABNORMAL HIGH (ref 70–99)

## 2014-10-01 MED ORDER — SODIUM CHLORIDE 0.9 % IV SOLN: 500.0000 mL | INTRAVENOUS | Status: DC

## 2014-10-01 NOTE — Op Note (Signed)
[place]  [address] [place], [address]   ENDOSCOPY PROCEDURE REPORT  PATIENT: Dean, Murphy  MR#: [id number] BIRTHDATE: 19-Sep-1926 , 87  yrs. old GENDER: male ENDOSCOPIST: Ladene Artist, MD, [facility] REFERRED BY:  Elsie Stain, M.D. PROCEDURE DATE:  10/01/2014 PROCEDURE:  EGD, diagnostic ASA CLASS:     Class III INDICATIONS:  melena. MEDICATIONS: Monitored anesthesia care and Propofol 150 mg IV TOPICAL ANESTHETIC: none DESCRIPTION OF PROCEDURE: After the risks benefits and alternatives of the procedure were thoroughly explained, informed consent was obtained.  The LB [id number] endoscope was introduced through the mouth and advanced to the second portion of the duodenum , Without limitations.  The instrument was slowly withdrawn as the mucosa was fully examined.    ESOPHAGUS: The mucosa of the esophagus appeared normal. STOMACH: The mucosa and folds of the stomach appeared normal. DUODENUM: The duodenal mucosa showed no abnormalities in the bulb and 2nd part of the duodenum.  Retroflexed views revealed no abnormalities.   The scope was then withdrawn from the patient and the procedure completed.  COMPLICATIONS: There were no immediate complications.  ENDOSCOPIC IMPRESSION: 1.   The EGD appeared normal  RECOMMENDATIONS: 1.  Proceed with a Colonoscopy to further evaluate melena   eSigned:  Ladene Artist, MD, [facility] 10/01/2014 10:25 AM

## 2014-10-01 NOTE — Progress Notes (Signed)
To recovery, report to Hodges, RN, VSS 

## 2014-10-01 NOTE — Patient Instructions (Signed)
YOU HAD AN ENDOSCOPIC PROCEDURE TODAY AT [facility]:   Refer to the procedure report that was given to you for any specific questions about what was found during the examination.  If the procedure report does not answer your questions, please call your gastroenterologist to clarify.  If you requested that your care partner not be given the details of your procedure findings, then the procedure report has been included in a sealed envelope for you to review at your convenience later.  YOU SHOULD EXPECT: Some feelings of bloating in the abdomen. Passage of more gas than usual.  Walking can help get rid of the air that was put into your GI tract during the procedure and reduce the bloating.   Please Note:  You might notice some irritation and congestion in your nose or some drainage.  This is from the oxygen used during your procedure.  There is no need for concern and it should clear up in a day or so.  SYMPTOMS TO REPORT IMMEDIATELY:    Following upper endoscopy (EGD)  Vomiting of blood or coffee ground material  New chest pain or pain under the shoulder blades  Painful or persistently difficult swallowing  New shortness of breath  Fever of 100F or higher  Black, tarry-looking stools  For urgent or emergent issues, a gastroenterologist can be reached at any hour by calling [phone].   DIET: Your first meal following the procedure should be a small meal and then it is ok to progress to your normal diet. Heavy or fried foods are harder to digest and may make you feel nauseous or bloated.  Likewise, meals heavy in dairy and vegetables can increase bloating.  Drink plenty of fluids but you should avoid alcoholic beverages for 24 hours.  ACTIVITY:  You should plan to take it easy for the rest of today and you should NOT DRIVE or use heavy machinery until tomorrow (because of the sedation medicines used during the test).    FOLLOW UP: Our staff will call the number listed  on your records the next business day following your procedure to check on you and address any questions or concerns that you may have regarding the information given to you following your procedure. If we do not reach you, we will leave a message.  However, if you are feeling well and you are not experiencing any problems, there is no need to return our call.  We will assume that you have returned to your regular daily activities without incident.  If any biopsies were taken you will be contacted by phone or by letter within the next 1-3 weeks.  Please call us at [phone] if you have not heard about the biopsies in 3 weeks.    SIGNATURES/CONFIDENTIALITY: You and/or your care partner have signed paperwork which will be entered into your electronic medical record.  These signatures attest to the fact that that the information above on your After Visit Summary has been reviewed and is understood.  Full responsibility of the confidentiality of this discharge information lies with you and/or your care-partner.

## 2014-10-02 ENCOUNTER — Telehealth: Payer: Self-pay | Admitting: Internal Medicine

## 2014-10-02 ENCOUNTER — Telehealth: Payer: Self-pay | Admitting: *Deleted

## 2014-10-02 NOTE — Telephone Encounter (Signed)
  Follow up Call-  Call back number 10/01/2014  Post procedure Call Back phone  # [phone]  Permission to leave phone message Yes     Patient questions:  Do you have a fever, pain , or abdominal swelling? No. Pain Score  0 *  Have you tolerated food without any problems? Yes.    Have you been able to return to your normal activities? Yes.    Do you have any questions about your discharge instructions: Diet   No. Medications  No. Follow up visit  No.  Do you have questions or concerns about your Care? No.  Actions: * If pain score is 4 or above: No action needed, pain <4.

## 2014-10-02 NOTE — Telephone Encounter (Signed)
Spoke w/pt in regards to transmitter---transmitter not working. Worked with transmitter for over 20 minutes with pt. Instructed to call Merlin. Pt to hang up and call Merlin and then call us back.

## 2014-10-02 NOTE — Telephone Encounter (Signed)
New message       Talk to someone regrding his monitor.  It had a red light on but now it has turned green.  Is this ok?

## 2014-10-05 ENCOUNTER — Ambulatory Visit (AMBULATORY_SURGERY_CENTER): Payer: Self-pay | Admitting: *Deleted

## 2014-10-05 VITALS — Ht 68.0 in | Wt 202.2 lb

## 2014-10-05 DIAGNOSIS — K921 Melena: Secondary | ICD-10-CM

## 2014-10-08 ENCOUNTER — Encounter: Payer: Self-pay | Admitting: Gastroenterology

## 2014-10-08 ENCOUNTER — Other Ambulatory Visit: Payer: Self-pay | Admitting: Gastroenterology

## 2014-10-08 ENCOUNTER — Ambulatory Visit (AMBULATORY_SURGERY_CENTER): Payer: Medicare Other | Admitting: Gastroenterology

## 2014-10-08 VITALS — BP 118/78 | HR 60 | Temp 96.6°F | Resp 18 | Ht 68.0 in | Wt 202.0 lb

## 2014-10-08 DIAGNOSIS — E119 Type 2 diabetes mellitus without complications: Secondary | ICD-10-CM | POA: Diagnosis not present

## 2014-10-08 DIAGNOSIS — Z951 Presence of aortocoronary bypass graft: Secondary | ICD-10-CM | POA: Diagnosis not present

## 2014-10-08 DIAGNOSIS — K921 Melena: Secondary | ICD-10-CM

## 2014-10-08 DIAGNOSIS — K635 Polyp of colon: Secondary | ICD-10-CM | POA: Diagnosis not present

## 2014-10-08 DIAGNOSIS — K219 Gastro-esophageal reflux disease without esophagitis: Secondary | ICD-10-CM | POA: Diagnosis not present

## 2014-10-08 DIAGNOSIS — G4700 Insomnia, unspecified: Secondary | ICD-10-CM | POA: Diagnosis not present

## 2014-10-08 DIAGNOSIS — D124 Benign neoplasm of descending colon: Secondary | ICD-10-CM | POA: Diagnosis not present

## 2014-10-08 DIAGNOSIS — I10 Essential (primary) hypertension: Secondary | ICD-10-CM | POA: Diagnosis not present

## 2014-10-08 DIAGNOSIS — D123 Benign neoplasm of transverse colon: Secondary | ICD-10-CM

## 2014-10-08 DIAGNOSIS — D125 Benign neoplasm of sigmoid colon: Secondary | ICD-10-CM

## 2014-10-08 DIAGNOSIS — D122 Benign neoplasm of ascending colon: Secondary | ICD-10-CM | POA: Diagnosis not present

## 2014-10-08 DIAGNOSIS — K5732 Diverticulitis of large intestine without perforation or abscess without bleeding: Secondary | ICD-10-CM | POA: Diagnosis not present

## 2014-10-08 DIAGNOSIS — I4891 Unspecified atrial fibrillation: Secondary | ICD-10-CM | POA: Diagnosis not present

## 2014-10-08 LAB — GLUCOSE, CAPILLARY
Glucose-Capillary: 186 mg/dL — ABNORMAL HIGH (ref 65–99)
Glucose-Capillary: 146 mg/dL — ABNORMAL HIGH (ref 65–99)

## 2014-10-08 MED ORDER — SODIUM CHLORIDE 0.9 % IV SOLN: 500.0000 mL | INTRAVENOUS | Status: DC

## 2014-10-08 NOTE — Progress Notes (Signed)
Called to room to assist during endoscopic procedure.  Patient ID and intended procedure confirmed with present staff. Received instructions for my participation in the procedure from the performing physician.  

## 2014-10-08 NOTE — Op Note (Addendum)
[place]  [address]. [place], [address]   COLONOSCOPY PROCEDURE REPORT  PATIENT: Dean Murphy, Monday  MR#: [id number] BIRTHDATE: [birth year] , 81  yrs. old GENDER: male ENDOSCOPIST: Ladene Artist, MD, [facility] REFERRED [id number] Damita Dunnings, M.D. PROCEDURE DATE:  10/08/2014 PROCEDURE:   Colonoscopy, diagnostic and Colonoscopy with snare polypectomy First Screening Colonoscopy - Avg.  risk and is 50 yrs.  old or older - No.  Prior Negative Screening - Now for repeat screening. N/A  History of Adenoma - Now for follow-up colonoscopy & has been > or = to 3 yrs.  N/A  Polyps removed today? Yes ASA CLASS:   Class III INDICATIONS:Evaluation of unexplained GI bleeding, Colorectal Neoplasm Risk Assessment for this procedure is average risk, and melena. MEDICATIONS: Monitored anesthesia care, Propofol 260 mg IV, and lidocaine 40 mg IV DESCRIPTION OF PROCEDURE:   After the risks benefits and alternatives of the procedure were thoroughly explained, informed consent was obtained.  The digital rectal exam revealed no abnormalities of the rectum.   The LB [id number]  endoscope was introduced through the anus and advanced to the cecum, which was identified by both the appendix and ileocecal valve. No adverse events experienced.   The quality of the prep was good.  (Suprep was used)  The instrument was then slowly withdrawn as the colon was fully examined.    COLON FINDINGS: Six sessile polyps measuring 5-7 mm in size were found in the descending colon, sigmoid colon, ascending colon, and transverse colon.  Polypectomies were performed with a cold snare. The resection was complete, the polyp tissue was completely retrieved and sent to histology.   A sessile polyp measuring 8 mm in size was found in the ascending colon.  A polypectomy was performed using snare cautery.  The resection was complete, the polyp tissue was completely retrieved and sent to histology. There was  moderate diverticulosis noted in the sigmoid colon and descending colon with associated muscular hypertrophy.   The examination was otherwise normal.  Retroflexed views revealed external hemorrhoids. The time to cecum = 11.1 Withdrawal time = 14.6   The scope was withdrawn and the procedure completed.  COMPLICATIONS: There were no immediate complications.    ENDOSCOPIC IMPRESSION: 1.   Six sessile polyps in the descending, sigmoid, ascending and transverse colon; polypectomies performed with a cold snare 2.   Sessile polyp in the ascending colon; polypectomy performed using snare cautery 3.   Moderate diverticulosis noted in the sigmoid colon and descending colon 4.   External hemorrhoids  RECOMMENDATIONS: 1.  Hold Aspirin and all other NSAIDS for 2 weeks. 2.  Await pathology results 3.  High fiber diet with liberal fluid intake. 4.  Suspected diverticular bleed. If rebleeding occurs will proceed with SB Capsule Endoscopy 5.  Given age there are no plans for future screening or surveillance colonoscopies. These types of exams usually stop aroung age 81.  eSigned:  Ladene Artist, MD, [facility] 10/08/2014 9:45 AM Revised: 10/08/2014 9:45 AM [C   PATIENT NAME:  Dean Murphy MR#: [id number]

## 2014-10-08 NOTE — Progress Notes (Signed)
Stable to RR 

## 2014-10-08 NOTE — Patient Instructions (Signed)
YOU HAD AN ENDOSCOPIC PROCEDURE TODAY AT [facility]:   Refer to the procedure report that was given to you for any specific questions about what was found during the examination.  If the procedure report does not answer your questions, please call your gastroenterologist to clarify.  If you requested that your care partner not be given the details of your procedure findings, then the procedure report has been included in a sealed envelope for you to review at your convenience later.  YOU SHOULD EXPECT: Some feelings of bloating in the abdomen. Passage of more gas than usual.  Walking can help get rid of the air that was put into your GI tract during the procedure and reduce the bloating. If you had a lower endoscopy (such as a colonoscopy or flexible sigmoidoscopy) you may notice spotting of blood in your stool or on the toilet paper. If you underwent a bowel prep for your procedure, you may not have a normal bowel movement for a few days.  Please Note:  You might notice some irritation and congestion in your nose or some drainage.  This is from the oxygen used during your procedure.  There is no need for concern and it should clear up in a day or so.  SYMPTOMS TO REPORT IMMEDIATELY:   Following lower endoscopy (colonoscopy or flexible sigmoidoscopy):  Excessive amounts of blood in the stool  Significant tenderness or worsening of abdominal pains  Swelling of the abdomen that is new, acute  Fever of 100F or higher   For urgent or emergent issues, a gastroenterologist can be reached at any hour by calling [phone].   DIET: Your first meal following the procedure should be a small meal and then it is ok to progress to your normal diet. Heavy or fried foods are harder to digest and may make you feel nauseous or bloated.  Likewise, meals heavy in dairy and vegetables can increase bloating.  Drink plenty of fluids but you should avoid alcoholic beverages for 24 hours. Try to  increase the fiber in your diet.  ACTIVITY:  You should plan to take it easy for the rest of today and you should NOT DRIVE or use heavy machinery until tomorrow (because of the sedation medicines used during the test).    FOLLOW UP: Our staff will call the number listed on your records the next business day following your procedure to check on you and address any questions or concerns that you may have regarding the information given to you following your procedure. If we do not reach you, we will leave a message.  However, if you are feeling well and you are not experiencing any problems, there is no need to return our call.  We will assume that you have returned to your regular daily activities without incident.  If any biopsies were taken you will be contacted by phone or by letter within the next 1-3 weeks.  Please call us at [phone] if you have not heard about the biopsies in 3 weeks.    SIGNATURES/CONFIDENTIALITY: You and/or your care partner have signed paperwork which will be entered into your electronic medical record.  These signatures attest to the fact that that the information above on your After Visit Summary has been reviewed and is understood.  Full responsibility of the confidentiality of this discharge information lies with you and/or your care-partner.  Hold all aspirin and NSAIDS for two weeks to prevent bleeding.  Read all handouts given  to you by your recovery room nurse.

## 2014-10-09 ENCOUNTER — Telehealth: Payer: Self-pay

## 2014-10-09 NOTE — Telephone Encounter (Signed)
  Follow up Call-  Call back number 10/08/2014 10/01/2014  Post procedure Call Back phone  # [phone]  Permission to leave phone message Yes Yes     Patient questions:  Do you have a fever, pain , or abdominal swelling? No. Pain Score  0 *  Have you tolerated food without any problems? Yes.    Have you been able to return to your normal activities? Yes.    Do you have any questions about your discharge instructions: Diet   No. Medications  No. Follow up visit  No.  Do you have questions or concerns about your Care? No.  Actions: * If pain score is 4 or above: No action needed, pain <4. No problems per the pt. maw

## 2014-10-11 ENCOUNTER — Telehealth: Payer: Self-pay | Admitting: *Deleted

## 2014-10-11 ENCOUNTER — Encounter: Payer: Self-pay | Admitting: Gastroenterology

## 2014-10-11 NOTE — Telephone Encounter (Signed)
Form for diabetic supplies placed in Dr. Josefine Class In [place].

## 2014-10-12 NOTE — Telephone Encounter (Signed)
Form has been faxed to diabetic supply company

## 2014-10-12 NOTE — Telephone Encounter (Signed)
Form done, thanks ?

## 2014-10-16 ENCOUNTER — Ambulatory Visit (INDEPENDENT_AMBULATORY_CARE_PROVIDER_SITE_OTHER): Payer: Medicare Other | Admitting: Cardiology

## 2014-10-16 ENCOUNTER — Encounter: Payer: Self-pay | Admitting: Cardiology

## 2014-10-16 VITALS — BP 138/68 | HR 72 | Ht 68.0 in | Wt 201.0 lb

## 2014-10-16 DIAGNOSIS — I10 Essential (primary) hypertension: Secondary | ICD-10-CM | POA: Diagnosis not present

## 2014-10-16 DIAGNOSIS — I480 Paroxysmal atrial fibrillation: Secondary | ICD-10-CM

## 2014-10-16 DIAGNOSIS — Z7901 Long term (current) use of anticoagulants: Secondary | ICD-10-CM

## 2014-10-16 DIAGNOSIS — Z950 Presence of cardiac pacemaker: Secondary | ICD-10-CM

## 2014-10-16 DIAGNOSIS — E785 Hyperlipidemia, unspecified: Secondary | ICD-10-CM

## 2014-10-16 DIAGNOSIS — I442 Atrioventricular block, complete: Secondary | ICD-10-CM

## 2014-10-16 MED ORDER — APIXABAN 2.5 MG PO TABS: 2.5000 mg | ORAL_TABLET | Freq: Two times a day (BID) | ORAL | Status: DC

## 2014-10-16 MED ORDER — AMLODIPINE BESYLATE 5 MG PO TABS: 5.0000 mg | ORAL_TABLET | Freq: Every day | ORAL | Status: DC

## 2014-10-16 MED ORDER — ROSUVASTATIN CALCIUM 20 MG PO TABS: 20.0000 mg | ORAL_TABLET | Freq: Every day | ORAL | Status: DC

## 2014-10-16 MED ORDER — ATORVASTATIN CALCIUM 40 MG PO TABS: 40.0000 mg | ORAL_TABLET | Freq: Every day | ORAL | Status: DC

## 2014-10-16 NOTE — Patient Instructions (Addendum)
Medication Instructions:   STOP TAKING ASPIRIN  RESTART TAKING YOUR ELIQUIS 2.5 MG TWICE DAILY  START TAKING CRESTOR 20 MG DAILY  DECREASE YOUR AMLODIPINE TO 5 MG ONCE DAILY      Follow-Up:   3 MONTHS WITH DR Meda Coffee

## 2014-10-16 NOTE — Progress Notes (Signed)
Patient ID: Dean Murphy, male   DOB: 04-11-1927, 81 y.o.   MRN: [id number]    Electrophysiology Office Note Date: 10/16/2014  ID:  Dean Murphy, DOB 08-29-1926, MRN [id number]  PCP: Elsie Stain, MD Primary Cardiologist: Meda Coffee Electrophysiologist: Lovena Le  CC: Pacemaker post hospital follow-up, CHF  Dean Murphy is a 81 y.o. male is seen today 2 months post PM implantation for CHB complicated by acute diastolic CHF. He was seen by [name] a months ago and his PM function is normal.   He was seen by pulmonary who recommended short term home O2 with re-evaluation after discharge. This has improved and he is off home O2.   He feels significantly better than before, he denies chest pain, palpitations, dyspnea, PND, orthopnea, nausea, vomiting, syncope. hIs only concern is mild dizziness with standing up.  He was previously on Crestor and tolerated well, he would like to resume this instead of taking Lipitor.   Device History: STJ dual chamber PPM implanted 07-2014 for complete heart block by [name]   Past Medical History  Diagnosis Date  . Diabetes mellitus   . GERD (gastroesophageal reflux disease)   . Hyperlipidemia   . Hypertension   . Arthritis   . Diverticulosis   . Hemorrhoids   . Colon polyps   . CAD (coronary artery disease)     CABG 1989, Myoview low risk 2012  . CHB (complete heart block) March 2016    a. s/p STJ dual chamber pacemaker  . Osteoporosis   . Chronic kidney disease, stage IV (severe)     stage III/IV as of 2015   Past Surgical History  Procedure Laterality Date  . Coronary artery bypass graft    . Cholecystectomy    . Total knee arthroplasty      bilateral  . Tonsillectomy    . Carotid endarterectomy  12/31/10    Right  . Carotid endarterectomy  02/09/11    left  . Joint replacement      bilat. knees  . Cardiac catheterization  08/11/2014    Procedure: TEMPORARY PACEMAKER;  Surgeon: [name], MD;  Location: [facility] CATH LAB;  Service:  Cardiovascular;;  . Permanent pacemaker insertion N/A 08/13/2014    STJ dual chamber pacemaker implanted by [name] for CHB    Current Outpatient Prescriptions  Medication Sig Dispense Refill  . AMITIZA 8 MCG capsule TAKE 1 CAPSULE DAILY WITH  BREAKFAST 90 capsule 3  . amLODipine (NORVASC) 10 MG tablet Take 1 tablet (10 mg total) by mouth daily. 90 tablet 2  . aspirin 81 MG tablet Take 81 mg by mouth daily.    . benazepril (LOTENSIN) 20 MG tablet Take 1 tablet (20 mg total) by mouth daily. 90 tablet 3  . febuxostat (ULORIC) 40 MG tablet Take 1 tablet (40 mg total) by mouth daily. 90 tablet 3  . furosemide (LASIX) 40 MG tablet Take 1 tablet (40 mg total) by mouth daily as needed. 30 tablet 3  . glipiZIDE (GLUCOTROL) 10 MG tablet Take 1 tablet (10 mg total) by mouth daily before breakfast. 90 tablet 3  . NON FORMULARY daily. careconcepts lancets an d glucometer strips    . omeprazole (PRILOSEC) 40 MG capsule Take 1 capsule (40 mg total) by mouth daily. 30 capsule 11   No current facility-administered medications for this visit.    Allergies:   Reclast   Social History: History   Social History  . Marital Status: Married  Spouse Name: N/A  . Number of Children: N/A  . Years of Education: N/A   Occupational History  . retired    Social History Main Topics  . Smoking status: Former Smoker -- 1.00 packs/day for 25 years    Types: Cigarettes    Quit date: 01/21/1973  . Smokeless tobacco: Never Used     Comment: began smoking in high school, quit age 81  . Alcohol Use: 0.0 oz/week    0 Standard drinks or equivalent per week     Comment: 1-2 per week  . Drug Use: No  . Sexual Activity: Not on file   Other Topics Concern  . 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  Remarried 12.     3 sons    Family History: Family History  Problem Relation Age of Onset  . Diabetes    . Cancer    .  Heart disease Mother   . Heart attack Father   . Colon cancer Neg Hx      Review of Systems: All other systems reviewed and are otherwise negative except as noted above.   Physical Exam: VS:  BP 138/68 mmHg  Pulse 72  Ht 5\' 8"  (1.727 m)  Wt 201 lb (91.173 kg)  BMI 30.57 kg/m2 , BMI Body mass index is 30.57 kg/(m^2).  GEN- The patient is well appearing, alert and oriented x 3 today.   HEENT: normocephalic, atraumatic; sclera clear, conjunctiva pink; hearing intact; oropharynx clear; neck supple Lungs- Clear to ausculation bilaterally, normal work of breathing.  No wheezes, rales, rhonchi Heart- Regular rate and rhythm, 2/6 SEM GI- soft, non-tender, non-distended, bowel sounds present  Extremities- no clubbing, cyanosis, or edema; DP/PT/radial pulses 2+ bilaterally MS- no significant deformity or atrophy Skin- warm and dry, no rash or lesion; PPM pocket well healed Psych- euthymic mood, full affect Neuro- strength and sensation are intact  PPM Interrogation- reviewed in detail today,  See PACEART report  EKG:  EKG is not ordered today.  Recent Labs: 08/11/2014: ALT 25; B Natriuretic Peptide 684.3* 08/13/2014: TSH 1.582 08/29/2014: BUN 22; Creatinine 1.61*; Potassium 4.6; Sodium 139 09/27/2014: Hemoglobin 10.7*; Platelets 150.0   Wt Readings from Last 3 Encounters:  10/16/14 201 lb (91.173 kg)  10/08/14 202 lb (91.627 kg)  10/05/14 202 lb 3.2 oz (91.717 kg)    Other studies Reviewed: Additional studies/ records that were reviewed today include: hospital records  ECG: SR, LBBB, PVCs, intermittent V-pacing    Assessment and Plan:  1.  Complete heart block Normal PPM function Pacemaker pocket well healed No changes today  2.  Paroxysmal atrial fibrillation Identified on recent hospital admission AF burden by device interrogation today <1% Restart Eliquis 2.5 mg po BID for CHADS2VASC of 5  3.  Acute on chronic renal failure Improved Crea 2.0 to 1.6 on lab on  08/29/14  4.  Hypoxia O2 sat today 97% with ambulation on room air, resolved, he doesn't use home O2 anymore  5.  Acute on chronic diastolic heart failure Improved post pacing Continue lasix 40 mg po daily as needed  5.  CAD No recent ischemic symptoms  6. HLP - restart crestor 20 mg po daily  7. HTN - he feels slightly dizzy with orthostasis, we will decrease the dose of amlodipine to 5 mg po daily   Disposition:   Follow up with Dr Meda Coffee in 3  months   Dorothy Spark, MD   [address] [place] [address] [phone] (office) [phone] (fax)

## 2014-10-23 ENCOUNTER — Encounter: Payer: Self-pay | Admitting: Family

## 2014-10-25 ENCOUNTER — Ambulatory Visit (HOSPITAL_COMMUNITY)
Admission: RE | Admit: 2014-10-25 | Discharge: 2014-10-25 | Disposition: A | Discharge status: Home / Self Care | Payer: Medicare Other | Source: Ambulatory Visit | Attending: Family | Admitting: Family

## 2014-10-25 ENCOUNTER — Encounter: Payer: Self-pay | Admitting: Family

## 2014-10-25 ENCOUNTER — Ambulatory Visit (INDEPENDENT_AMBULATORY_CARE_PROVIDER_SITE_OTHER): Payer: Medicare Other | Admitting: Family

## 2014-10-25 VITALS — BP 151/76 | HR 69 | Resp 16 | Ht 68.0 in | Wt 202.0 lb

## 2014-10-25 DIAGNOSIS — Z9889 Other specified postprocedural states: Secondary | ICD-10-CM

## 2014-10-25 DIAGNOSIS — Z87891 Personal history of nicotine dependence: Secondary | ICD-10-CM | POA: Diagnosis not present

## 2014-10-25 DIAGNOSIS — Z48812 Encounter for surgical aftercare following surgery on the circulatory system: Secondary | ICD-10-CM | POA: Insufficient documentation

## 2014-10-25 DIAGNOSIS — I6523 Occlusion and stenosis of bilateral carotid arteries: Secondary | ICD-10-CM

## 2014-10-25 DIAGNOSIS — R0989 Other specified symptoms and signs involving the circulatory and respiratory systems: Secondary | ICD-10-CM | POA: Diagnosis not present

## 2014-10-25 DIAGNOSIS — I739 Peripheral vascular disease, unspecified: Secondary | ICD-10-CM | POA: Diagnosis not present

## 2014-10-25 NOTE — Progress Notes (Signed)
Established Carotid Patient   History of Present Illness  Dean Murphy is a 81 y.o. male patient of Dr. Oneida Alar who presents for follow-up evaluation for carotid stenosis. He is on Aspirin for antiplatelet therapy. His atherosclerotic risk factors remain diabetes, elevated cholesterol, hypertension and coronary artery disease. These are all currently stable and followed by his primary care physician. He denies any new neurologic events including amaurosis, numbness, or weakness. He had bilateral carotid endarterectomy in 2012. He is physically active.  He does not seem to have claudication symptoms, denies non healing wounds. His energy has started to improve since his most recent MI. He had an MI March 2016, had pacemaker inserted in March also. He had one previous MI in 1976, 5 vessel CABG in 1989.   Pt reports tingling in the toes of both feet.  The patient reports history of TIA , amaurosis fugax, in his left eye that last about 5 minutes; this occurred before his first CEA. The patient denies a history of unilateral facial drooping, denies a history of hemiplegia, and denies a history of receptive or expressive aphasia.    Pt Diabetic: yes, states in good control Pt smoker: former smoker, quit about 1974  Pt meds include: Statin : yes ASA: no Other anticoagulants/antiplatelets: Elliquis since pacemaker insertion, stopped for a short period when he had GI bleeding   Past Medical History  Diagnosis Date  . Diabetes mellitus   . GERD (gastroesophageal reflux disease)   . Hyperlipidemia   . Hypertension   . Arthritis   . Diverticulosis   . Hemorrhoids   . Colon polyps   . CAD (coronary artery disease)     CABG 1989, Myoview low risk 2012  . CHB (complete heart block) March 2016    a. s/p STJ dual chamber pacemaker  . Osteoporosis   . Chronic kidney disease, stage IV (severe)     stage III/IV as of 2015  . Atrial fibrillation March 2016  . CHF (congestive heart failure)    . Carotid artery occlusion   . Myocardial infarction 1976  and Mar. 19, 2016    Social History History  Substance Use Topics  . Smoking status: Former Smoker -- 1.00 packs/day for 25 years    Types: Cigarettes    Quit date: 01/21/1973  . Smokeless tobacco: Never Used     Comment: began smoking in high school, quit age 36  . Alcohol Use: 0.0 oz/week    0 Standard drinks or equivalent per week     Comment: 1-2 per week    Family History Family History  Problem Relation Age of Onset  . Diabetes    . Cancer    . Heart disease Mother     Before age 80  . Diabetes Mother   . Varicose Veins Mother   . Heart attack Mother   . Heart attack Father   . Heart disease Father     After age 41  . Hypertension Father   . Colon cancer Neg Hx   . Heart disease Brother     Before age 75  . Hypertension Brother   . Heart attack Brother   . Diabetes Son   . Hypertension Son   . Hypertension Son   . Diabetes Son     Surgical History Past Surgical History  Procedure Laterality Date  . Coronary artery bypass graft    . Cholecystectomy    . Total knee arthroplasty      bilateral  .  Tonsillectomy    . Carotid endarterectomy  12/31/10    Right  . Carotid endarterectomy  02/09/11    left  . Joint replacement      bilat. knees  . Cardiac catheterization  08/11/2014    Procedure: TEMPORARY PACEMAKER;  Surgeon: [name], MD;  Location: [facility] CATH LAB;  Service: Cardiovascular;;  . Permanent pacemaker insertion N/A 08/13/2014    STJ dual chamber pacemaker implanted by [name] for CHB    Allergies  Allergen Reactions  . Reclast [Zoledronic Acid]     Stopped 2015 due to Cr    Current Outpatient Prescriptions  Medication Sig Dispense Refill  . AMITIZA 8 MCG capsule TAKE 1 CAPSULE DAILY WITH  BREAKFAST 90 capsule 3  . amLODipine (NORVASC) 5 MG tablet Take 1 tablet (5 mg total) by mouth daily. 90 tablet 3  . apixaban (ELIQUIS) 2.5 MG TABS tablet Take 1 tablet (2.5 mg total) by  mouth 2 (two) times daily. 90 tablet 3  . benazepril (LOTENSIN) 20 MG tablet Take 1 tablet (20 mg total) by mouth daily. 90 tablet 3  . febuxostat (ULORIC) 40 MG tablet Take 1 tablet (40 mg total) by mouth daily. 90 tablet 3  . furosemide (LASIX) 40 MG tablet Take 1 tablet (40 mg total) by mouth daily as needed. 30 tablet 3  . glipiZIDE (GLUCOTROL) 10 MG tablet Take 1 tablet (10 mg total) by mouth daily before breakfast. (Patient taking differently: Take 5 mg by mouth daily before breakfast. ) 90 tablet 3  . NON FORMULARY daily. careconcepts lancets an d glucometer strips    . omeprazole (PRILOSEC) 40 MG capsule Take 1 capsule (40 mg total) by mouth daily. 30 capsule 11  . rosuvastatin (CRESTOR) 20 MG tablet Take 1 tablet (20 mg total) by mouth daily. 90 tablet 3   No current facility-administered medications for this visit.    Review of Systems : See HPI for pertinent positives and negatives.  Physical Examination  Filed Vitals:   10/25/14 1141 10/25/14 1146 10/25/14 1158  BP: 160/82 149/68 151/76  Pulse: 72 65 69  Resp:  16   Height:  5\' 8"  (1.727 m)   Weight:  202 lb (91.627 kg)   SpO2:  98%    Body mass index is 30.72 kg/(m^2).  General: WDWN obese male in NAD GAIT: normal Eyes: PERRLA Pulmonary:  Non-labored, CTAB, Negative  Rales, Negative rhonchi, & Negative wheezing.  Cardiac: regular Rhythm, no detected murmur. Subcutaneous pacemaker left upper chest.  VASCULAR EXAM Carotid Bruits Right Left   Positive Negative    Aorta is not palpable. Radial pulses are 2+ palpable and equal.                                                                                                                            LE Pulses Right Left       FEMORAL  1+ palpable  2+ palpable  POPLITEAL  not palpable   not palpable       POSTERIOR TIBIAL  faintly palpable   1+ palpable        DORSALIS PEDIS      ANTERIOR TIBIAL faintly palpable  1+ palpable     Gastrointestinal:  soft, nontender, BS WNL, no r/g, no palpated masses.  Musculoskeletal: Negative muscle atrophy/wasting. M/S 5/5 throughout, Extremities without ischemic changes.  Neurologic: A&O X 3; Appropriate Affect;  Speech is normal CN 2-12 intact, Pain and light touch intact in extremities, Motor exam as listed above.   Non-Invasive Vascular Imaging CAROTID DUPLEX 10/25/2014   CEREBROVASCULAR DUPLEX EVALUATION    INDICATION: Carotid artery disease     PREVIOUS INTERVENTION(S): Right carotid endarterectomy 12/31/2010 Left carotid endarterectomy 02/09/2011    DUPLEX EXAM:     RIGHT  LEFT  Peak Systolic Velocities (cm/s) End Diastolic Velocities (cm/s) Plaque LOCATION Peak Systolic Velocities (cm/s) End Diastolic Velocities (cm/s) Plaque  99 16  CCA PROXIMAL 165 22   110 21  CCA MID 127 22   112 21 HT CCA DISTAL 102 24   129 0  ECA 160 16   129 30 HM ICA PROXIMAL 110 25   145 35  ICA MID 156 28   146 36  ICA DISTAL 148 35      ICA / CCA Ratio (PSV)   Antegrade  Vertebral Flow Antegrade    Brachial Systolic Pressure (mmHg)   Multiphasic (Subclavian artery) Brachial Artery Waveforms Multiphasic (Subclavian artery)    Plaque Morphology:  HM = Homogeneous, HT = Heterogeneous, CP = Calcific Plaque, SP = Smooth Plaque, IP = Irregular Plaque  ADDITIONAL FINDINGS:     IMPRESSION: Patent right and left carotid endarterectomy sites with evidence of mild hyperplasia in the right surgical bulb.     Compared to the previous exam:  No significant change in comparison to the last exam on 10/12/2013.      Assessment: Dean Murphy is a 81 y.o. male who presents s/p staged bilateral carotid endarterectomies in 2012. He had left amaurosis fugax TIA in 2012 before his fIrst CEA; he has had no subsequent TIA's or strokes. Today's carotid Duplex suggests patent right and left carotid endarterectomy sites with evidence of mild hyperplasia in the right surgical bulb.  No significant change in comparison to  the last exam on 10/12/2013.  Diminished but palpable pulses in feet. January 2015 ABI's were in the normal range, right less than left, with some partial stenoses noted; will check ABI's in addition to carotid Duplex when he returns in a year.  Plan: Follow-up in 1 year with Carotid Duplex and ABI's.   I discussed in depth with the patient the nature of atherosclerosis, and emphasized the importance of maximal medical management including strict control of blood pressure, blood glucose, and lipid levels, obtaining regular exercise, and continued cessation of smoking.  The patient is aware that without maximal medical management the underlying atherosclerotic disease process will progress, limiting the benefit of any interventions. The patient was given information about stroke prevention and what symptoms should prompt the patient to seek immediate medical care. Thank you for allowing [place] to participate in this patient's care.  Clemon Chambers, RN, MSN, FNP-C [facility] Office: [phone]  Clinic Physician: Oneida Alar  10/25/2014 12:13 PM

## 2014-10-25 NOTE — Progress Notes (Signed)
Filed Vitals:   10/25/14 1141 10/25/14 1146 10/25/14 1158  BP: 160/82 149/68 151/76  Pulse: 72 65 69  Resp:  16   Height:  5\' 8"  (1.727 m)   Weight:  202 lb (91.627 kg)   SpO2:  98%

## 2014-10-25 NOTE — Patient Instructions (Signed)
Stroke Prevention Some medical conditions and behaviors are associated with an increased chance of having a stroke. You may prevent a stroke by making healthy choices and managing medical conditions. HOW CAN I REDUCE MY RISK OF HAVING A STROKE?   Stay physically active. Get at least 30 minutes of activity on most or all days.  Do not smoke. It may also be helpful to avoid exposure to secondhand smoke.  Limit alcohol use. Moderate alcohol use is considered to be:  No more than 2 drinks per day for men.  No more than 1 drink per day for nonpregnant women.  Eat healthy foods. This involves:  Eating 5 or more servings of fruits and vegetables a day.  Making dietary changes that address high blood pressure (hypertension), high cholesterol, diabetes, or obesity.  Manage your cholesterol levels.  Making food choices that are high in fiber and low in saturated fat, trans fat, and cholesterol may control cholesterol levels.  Take any prescribed medicines to control cholesterol as directed by your health care provider.  Manage your diabetes.  Controlling your carbohydrate and sugar intake is recommended to manage diabetes.  Take any prescribed medicines to control diabetes as directed by your health care provider.  Control your hypertension.  Making food choices that are low in salt (sodium), saturated fat, trans fat, and cholesterol is recommended to manage hypertension.  Take any prescribed medicines to control hypertension as directed by your health care provider.  Maintain a healthy weight.  Reducing calorie intake and making food choices that are low in sodium, saturated fat, trans fat, and cholesterol are recommended to manage weight.  Stop drug abuse.  Avoid taking birth control pills.  Talk to your health care provider about the risks of taking birth control pills if you are over 35 years old, smoke, get migraines, or have ever had a blood clot.  Get evaluated for sleep  disorders (sleep apnea).  Talk to your health care provider about getting a sleep evaluation if you snore a lot or have excessive sleepiness.  Take medicines only as directed by your health care provider.  For some people, aspirin or blood thinners (anticoagulants) are helpful in reducing the risk of forming abnormal blood clots that can lead to stroke. If you have the irregular heart rhythm of atrial fibrillation, you should be on a blood thinner unless there is a good reason you cannot take them.  Understand all your medicine instructions.  Make sure that other conditions (such as anemia or atherosclerosis) are addressed. SEEK IMMEDIATE MEDICAL CARE IF:   You have sudden weakness or numbness of the face, arm, or leg, especially on one side of the body.  Your face or eyelid droops to one side.  You have sudden confusion.  You have trouble speaking (aphasia) or understanding.  You have sudden trouble seeing in one or both eyes.  You have sudden trouble walking.  You have dizziness.  You have a loss of balance or coordination.  You have a sudden, severe headache with no known cause.  You have new chest pain or an irregular heartbeat. Any of these symptoms may represent a serious problem that is an emergency. Do not wait to see if the symptoms will go away. Get medical help at once. Call your local emergency services (911 in [place] Do not drive yourself to the hospital. Document Released: 06/18/2004 Document Revised: 09/25/2013 Document Reviewed: 11/11/2012 ExitCare Patient Information 2015 [employer]. This information is not intended to replace advice given   to you by your health care provider. Make sure you discuss any questions you have with your health care provider.  

## 2014-11-07 ENCOUNTER — Ambulatory Visit: Payer: Medicare Other | Admitting: Gastroenterology

## 2014-11-11 ENCOUNTER — Other Ambulatory Visit: Payer: Self-pay | Admitting: Family Medicine

## 2014-11-12 ENCOUNTER — Other Ambulatory Visit: Payer: Self-pay

## 2014-11-12 NOTE — Telephone Encounter (Signed)
Error

## 2014-11-15 ENCOUNTER — Encounter: Payer: Self-pay | Admitting: Internal Medicine

## 2014-11-15 ENCOUNTER — Ambulatory Visit (INDEPENDENT_AMBULATORY_CARE_PROVIDER_SITE_OTHER): Payer: Medicare Other | Admitting: Internal Medicine

## 2014-11-15 VITALS — BP 150/48 | HR 75 | Ht 69.0 in | Wt 204.6 lb

## 2014-11-15 DIAGNOSIS — I480 Paroxysmal atrial fibrillation: Secondary | ICD-10-CM

## 2014-11-15 DIAGNOSIS — Z950 Presence of cardiac pacemaker: Secondary | ICD-10-CM

## 2014-11-15 DIAGNOSIS — I442 Atrioventricular block, complete: Secondary | ICD-10-CM | POA: Diagnosis not present

## 2014-11-15 DIAGNOSIS — I10 Essential (primary) hypertension: Secondary | ICD-10-CM | POA: Diagnosis not present

## 2014-11-15 DIAGNOSIS — I6523 Occlusion and stenosis of bilateral carotid arteries: Secondary | ICD-10-CM | POA: Diagnosis not present

## 2014-11-15 MED ORDER — DILTIAZEM HCL ER COATED BEADS 240 MG PO CP24: 240.0000 mg | ORAL_CAPSULE | Freq: Every day | ORAL | Status: DC

## 2014-11-15 NOTE — Patient Instructions (Addendum)
Medication Instructions:  Your physician has recommended you make the following change in your medication:  1) STOP Amlodipine (Norvasc) 2) START Cardizem 240 mg tablet, by mouth, ONCE daily   Labwork: NONE  Testing/Procedures: NONE  Follow-Up: Remote monitoring is used to monitor your pacemaker from home. This monitoring reduces the number of office visits required to check your device to one time per year. It allows [place] to keep an eye on the functioning of your device to ensure it is working properly. You are scheduled for a device check from home on 02/14/15. You may send your transmission at any time that day. If you have a wireless device, the transmission will be sent automatically. After your physician reviews your transmission, you will receive a postcard with your next transmission date.  Your physician recommends that you schedule a follow-up appointment in: 9 months with Dr.Taylor.  Any Other Special Instructions Will Be Listed Below (If Applicable).

## 2014-11-16 DIAGNOSIS — Z950 Presence of cardiac pacemaker: Secondary | ICD-10-CM | POA: Insufficient documentation

## 2014-11-16 NOTE — Assessment & Plan Note (Signed)
His blood pressure is elevated in the office and has also been at home. He had previously been on amlodipine 10 mg daily but reduced his dose to 5 mg daily because of peripheral edema. Will stop amlodipine and start cardizem.

## 2014-11-16 NOTE — Assessment & Plan Note (Signed)
His [employer] DDD PM is working normally. He will return in several months for recheck.

## 2014-11-16 NOTE — Progress Notes (Signed)
HPI  Allergies  Allergen Reactions  . Reclast [Zoledronic Acid]     Stopped 2015 due to Creatine      Current Outpatient Prescriptions  Medication Sig Dispense Refill  . AMITIZA 8 MCG capsule TAKE 1 CAPSULE DAILY WITH  BREAKFAST (Patient taking differently: TAKE 1 CAPSULE BY MOUTH  DAILY WITH  BREAKFAST) 90 capsule 3  . apixaban (ELIQUIS) 2.5 MG TABS tablet Take 1 tablet (2.5 mg total) by mouth 2 (two) times daily. 90 tablet 3  . benazepril (LOTENSIN) 20 MG tablet TAKE 1 TABLET DAILY (Patient taking differently: TAKE 1 TABLET BY MOUTH DAILY) 90 tablet 3  . febuxostat (ULORIC) 40 MG tablet Take 1 tablet (40 mg total) by mouth daily. 90 tablet 3  . furosemide (LASIX) 40 MG tablet Take 1 tablet (40 mg total) by mouth daily as needed. (Patient taking differently: Take 40 mg by mouth daily as needed (swelling). ) 30 tablet 3  . glipiZIDE (GLUCOTROL) 5 MG tablet Take 5 mg by mouth daily before breakfast.    . NON FORMULARY daily. careconcepts lancets an d glucometer strips    . omeprazole (PRILOSEC) 40 MG capsule Take 1 capsule (40 mg total) by mouth daily. 30 capsule 11  . rosuvastatin (CRESTOR) 20 MG tablet Take 1 tablet (20 mg total) by mouth daily. 90 tablet 3  . diltiazem (CARDIZEM CD) 240 MG 24 hr capsule Take 1 capsule (240 mg total) by mouth daily. 90 capsule 3   No current facility-administered medications for this visit.     Past Medical History  Diagnosis Date  . Diabetes mellitus   . GERD (gastroesophageal reflux disease)   . Hyperlipidemia   . Hypertension   . Arthritis   . Diverticulosis   . Hemorrhoids   . Colon polyps   . CAD (coronary artery disease)     CABG 1989, Myoview low risk 2012  . CHB (complete heart block) March 2016    a. s/p STJ dual chamber pacemaker  . Osteoporosis   . Chronic kidney disease, stage IV (severe)     stage III/IV as of 2015  . Atrial fibrillation March 2016  . CHF (congestive heart failure)   . Carotid artery occlusion   .  Myocardial infarction 1976  and Mar. 19, 2016    ROS:   All systems reviewed and negative except as noted in the HPI.   Past Surgical History  Procedure Laterality Date  . Coronary artery bypass graft    . Cholecystectomy    . Total knee arthroplasty      bilateral  . Tonsillectomy    . Carotid endarterectomy  12/31/10    Right  . Carotid endarterectomy  02/09/11    left  . Joint replacement      bilat. knees  . Cardiac catheterization  08/11/2014    Procedure: TEMPORARY PACEMAKER;  Surgeon: [name], MD;  Location: [facility] CATH LAB;  Service: Cardiovascular;;  . Permanent pacemaker insertion N/A 08/13/2014    STJ dual chamber pacemaker implanted by [name] for CHB     Family History  Problem Relation Age of Onset  . Diabetes    . Cancer    . Heart disease Mother     Before age 45  . Diabetes Mother   . Varicose Veins Mother   . Heart attack Mother   . Heart attack Father   . Heart disease Father     After age 30  . Hypertension Father   .  Colon cancer Neg Hx   . Heart disease Brother     Before age 65  . Hypertension Brother   . Heart attack Brother   . Diabetes Son   . Hypertension Son   . Hypertension Son   . Diabetes Son      History   Social History  . Marital Status: Married    Spouse Name: N/A  . Number of Children: N/A  . Years of Education: N/A   Occupational History  . retired    Social History Main Topics  . Smoking status: Former Smoker -- 1.00 packs/day for 25 years    Types: Cigarettes    Quit date: 01/21/1973  . Smokeless tobacco: Never Used     Comment: began smoking in high school, quit age 74  . Alcohol Use: 0.0 oz/week    0 Standard drinks or equivalent per week     Comment: 1-2 per week  . Drug Use: No  . Sexual Activity: Not on file   Other Topics Concern  . 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  Remarried 1998.     3 sons     BP  150/48 mmHg  Pulse 75  Ht 5\' 9"  (1.753 m)  Wt 204 lb 9.6 oz (92.806 kg)  BMI 30.20 kg/m2  Physical Exam:  elderly appearing 81 yo man, NAD HEENT: Unremarkable Neck:  7cm JVD, no thyromegally Lymphatics:  No adenopathy Back:  No CVA tenderness Lungs:  Clear HEART:  Regular rate rhythm, no murmurs, no rubs, no clicks Abd:  soft, positive bowel sounds, no organomegally, no rebound, no guarding Ext:  2 plus pulses, no edema, no cyanosis, no clubbing Skin:  No rashes no nodules Neuro:  CN II through XII intact, motor grossly intact  DEVICE  Normal device function.  See PaceArt for details.   Assess/Plan:

## 2014-11-16 NOTE — Assessment & Plan Note (Signed)
He is maintaining NSR. He will continue his systemic anti-coagulation.  

## 2014-11-20 LAB — CUP PACEART INCLINIC DEVICE CHECK
Brady Statistic RA Percent Paced: 23 %
Brady Statistic RV Percent Paced: 99.14 %
Date Time Interrogation Session: 20160623161447
Lead Channel Impedance Value: 387.5 Ohm
Lead Channel Pacing Threshold Amplitude: 0.75 V
Lead Channel Pacing Threshold Amplitude: 0.75 V
Lead Channel Pacing Threshold Pulse Width: 0.5 ms
Lead Channel Pacing Threshold Pulse Width: 0.5 ms
Lead Channel Pacing Threshold Pulse Width: 0.5 ms
Lead Channel Sensing Intrinsic Amplitude: 12 mV
Lead Channel Sensing Intrinsic Amplitude: 2.8 mV
MDC IDC MSMT BATTERY REMAINING LONGEVITY: 122.4 mo
MDC IDC MSMT BATTERY VOLTAGE: 3.02 V
MDC IDC MSMT LEADCHNL RV IMPEDANCE VALUE: 412.5 Ohm
MDC IDC MSMT LEADCHNL RV PACING THRESHOLD AMPLITUDE: 0.5 V
MDC IDC SET LEADCHNL RA PACING AMPLITUDE: 2 V
MDC IDC SET LEADCHNL RV PACING AMPLITUDE: 0.75 V
MDC IDC SET LEADCHNL RV PACING PULSEWIDTH: 0.5 ms
MDC IDC SET LEADCHNL RV SENSING SENSITIVITY: 4 mV
Pulse Gen Model: 2240
Pulse Gen Serial Number: 7734710

## 2014-11-27 ENCOUNTER — Other Ambulatory Visit: Payer: Self-pay | Admitting: Family Medicine

## 2014-11-27 DIAGNOSIS — E1122 Type 2 diabetes mellitus with diabetic chronic kidney disease: Secondary | ICD-10-CM

## 2014-11-28 DIAGNOSIS — N183 Chronic kidney disease, stage 3 (moderate): Secondary | ICD-10-CM | POA: Diagnosis not present

## 2014-11-28 DIAGNOSIS — E1129 Type 2 diabetes mellitus with other diabetic kidney complication: Secondary | ICD-10-CM | POA: Diagnosis not present

## 2014-11-28 DIAGNOSIS — I10 Essential (primary) hypertension: Secondary | ICD-10-CM | POA: Diagnosis not present

## 2014-11-29 ENCOUNTER — Other Ambulatory Visit (INDEPENDENT_AMBULATORY_CARE_PROVIDER_SITE_OTHER): Payer: Medicare Other

## 2014-11-29 DIAGNOSIS — N189 Chronic kidney disease, unspecified: Secondary | ICD-10-CM | POA: Diagnosis not present

## 2014-11-29 DIAGNOSIS — E1122 Type 2 diabetes mellitus with diabetic chronic kidney disease: Secondary | ICD-10-CM | POA: Diagnosis not present

## 2014-11-29 LAB — COMPREHENSIVE METABOLIC PANEL
ALBUMIN: 3.9 g/dL (ref 3.5–5.2)
ALK PHOS: 75 U/L (ref 39–117)
ALT: 17 U/L (ref 0–53)
AST: 20 U/L (ref 0–37)
BUN: 32 mg/dL — ABNORMAL HIGH (ref 6–23)
CO2: 27 mEq/L (ref 19–32)
Calcium: 9.3 mg/dL (ref 8.4–10.5)
Chloride: 108 mEq/L (ref 96–112)
Creatinine, Ser: 2.12 mg/dL — ABNORMAL HIGH (ref 0.40–1.50)
GFR: 31.66 mL/min — ABNORMAL LOW (ref >60.00)
Glucose, Bld: 281 mg/dL — ABNORMAL HIGH (ref 70–99)
POTASSIUM: 5 meq/L (ref 3.5–5.1)
SODIUM: 140 meq/L (ref 135–145)
TOTAL PROTEIN: 6.5 g/dL (ref 6.0–8.3)
Total Bilirubin: 0.4 mg/dL (ref 0.2–1.2)

## 2014-11-29 LAB — CBC WITH DIFFERENTIAL/PLATELET
Basophils Absolute: 0 10*3/uL (ref 0.0–0.1)
Basophils Relative: 0.5 % (ref 0.0–3.0)
Eosinophils Absolute: 0.2 10*3/uL (ref 0.0–0.7)
Eosinophils Relative: 2.9 % (ref 0.0–5.0)
HEMATOCRIT: 29.3 % — AB (ref 39.0–52.0)
Hemoglobin: 9.5 g/dL — ABNORMAL LOW (ref 13.0–17.0)
LYMPHS ABS: 1.7 10*3/uL (ref 0.7–4.0)
LYMPHS PCT: 29.3 % (ref 12.0–46.0)
MCHC: 32.3 g/dL (ref 30.0–36.0)
MCV: 76.7 fl — ABNORMAL LOW (ref 78.0–100.0)
MONOS PCT: 7.1 % (ref 3.0–12.0)
Monocytes Absolute: 0.4 10*3/uL (ref 0.1–1.0)
NEUTROS ABS: 3.5 10*3/uL (ref 1.4–7.7)
Neutrophils Relative %: 60.2 % (ref 43.0–77.0)
Platelets: 139 10*3/uL — ABNORMAL LOW (ref 150.0–400.0)
RBC: 3.82 Mil/uL — AB (ref 4.22–5.81)
RDW: 16.5 % — ABNORMAL HIGH (ref 11.5–15.5)
WBC: 5.8 10*3/uL (ref 4.0–10.5)

## 2014-11-29 LAB — HEMOGLOBIN A1C: Hgb A1c MFr Bld: 8.5 % — ABNORMAL HIGH (ref 4.6–6.5)

## 2014-12-03 ENCOUNTER — Ambulatory Visit (INDEPENDENT_AMBULATORY_CARE_PROVIDER_SITE_OTHER): Payer: Medicare Other | Admitting: Family Medicine

## 2014-12-03 ENCOUNTER — Encounter: Payer: Self-pay | Admitting: Family Medicine

## 2014-12-03 VITALS — BP 148/60 | HR 73 | Temp 97.6°F | Wt 204.5 lb

## 2014-12-03 DIAGNOSIS — N179 Acute kidney failure, unspecified: Secondary | ICD-10-CM

## 2014-12-03 DIAGNOSIS — N189 Chronic kidney disease, unspecified: Secondary | ICD-10-CM | POA: Diagnosis not present

## 2014-12-03 DIAGNOSIS — D649 Anemia, unspecified: Secondary | ICD-10-CM

## 2014-12-03 DIAGNOSIS — E1122 Type 2 diabetes mellitus with diabetic chronic kidney disease: Secondary | ICD-10-CM | POA: Diagnosis not present

## 2014-12-03 DIAGNOSIS — I6523 Occlusion and stenosis of bilateral carotid arteries: Secondary | ICD-10-CM

## 2014-12-03 DIAGNOSIS — N289 Disorder of kidney and ureter, unspecified: Secondary | ICD-10-CM

## 2014-12-03 NOTE — Progress Notes (Signed)
Pre visit review using our clinic review tool, if applicable. No additional management support is needed unless otherwise documented below in the visit note.  Mult issues.    Off DM2 meds with diffusely high sugars- we didn't know he was off Dm2 med until he went home to check his meds.  rx sent after the fact.  No ade on med prev. A1c up.   Anemia.  Still with low hgb.  No known bleeding seen by patient.  Prev notes per GI for considering of capsule study should he need extra eval.  D/w pt.    Elevated Cr.  On meds at baseline, will notify renal clinic re: Cr.    Needed refill on amitiza.   Meds, vitals, and allergies reviewed.   ROS: See HPI.  Otherwise, noncontributory.  GEN: nad, alert and oriented HEENT: mucous membranes moist NECK: supple w/o LA CV: rrr PULM: ctab, no inc wob ABD: soft, +bs EXT: no edema SKIN: no acute rash

## 2014-12-03 NOTE — Patient Instructions (Signed)
Check your med list and update me.   I want to know about all of your meds, but especially the glipizide (dose per tab and number of tabs per day). I'll check with your GI and kidney docs.  Take care.  Glad to see you.  We'll need to recheck in 3 months with labs ahead of time.

## 2014-12-04 ENCOUNTER — Telehealth: Payer: Self-pay | Admitting: Family Medicine

## 2014-12-04 DIAGNOSIS — D509 Iron deficiency anemia, unspecified: Secondary | ICD-10-CM

## 2014-12-04 IMAGING — US US RENAL KIDNEY
1 series · 14 of 25 positions shown · non-contrast
Comparison: CT abdomen and pelvis April 26, 1999

CLINICAL DATA: Stage III chronic renal disease

EXAM:
RENAL/URINARY TRACT ULTRASOUND COMPLETE

[Series 1: us renal kidney · 0.26mm/px · 14 of 25 slices shown]
[im 1/25]
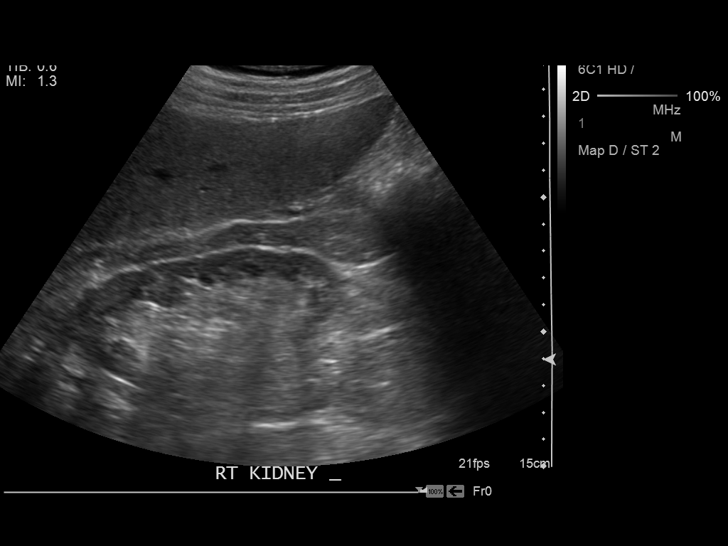
[im 3/25]
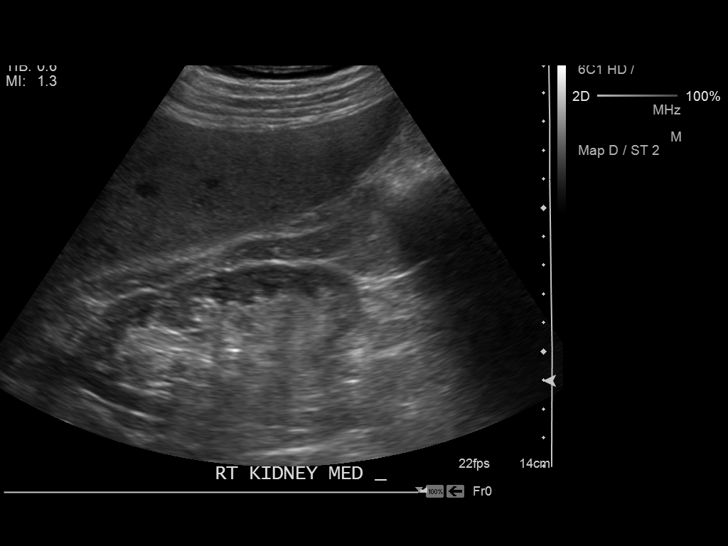
[im 5/25]
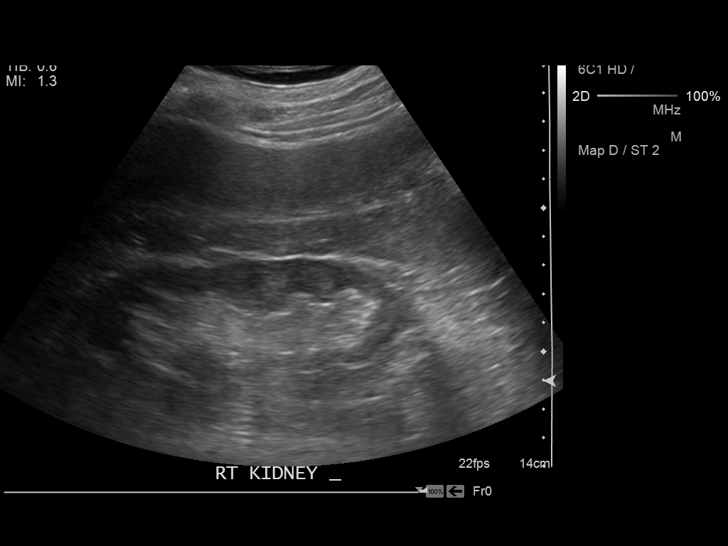
[im 7/25]
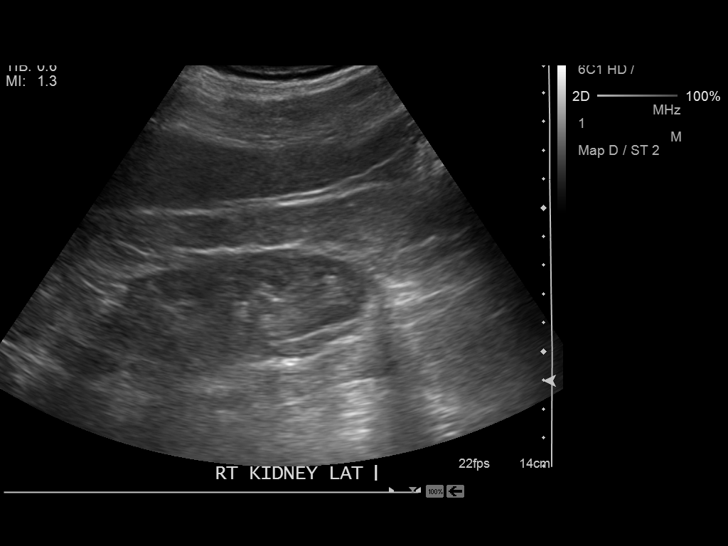
[im 9/25]
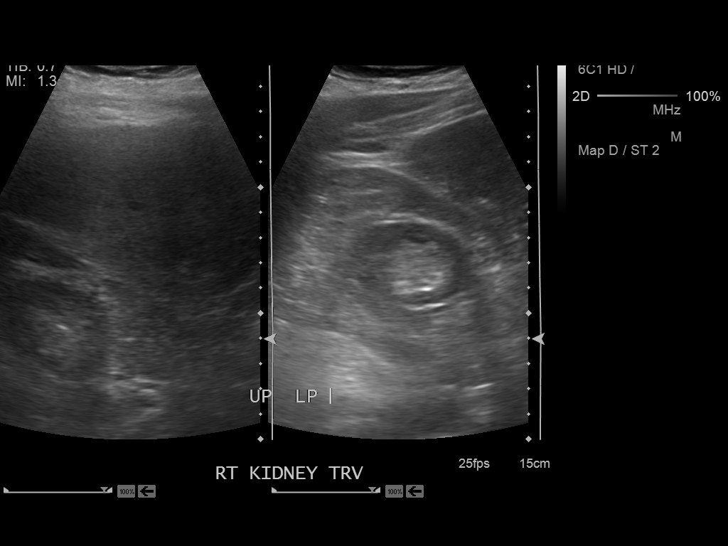
[im 10/25]
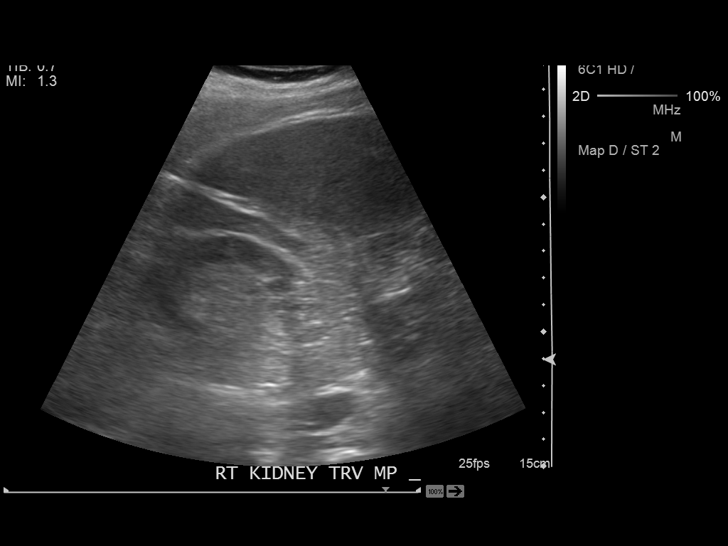
[im 12/25]
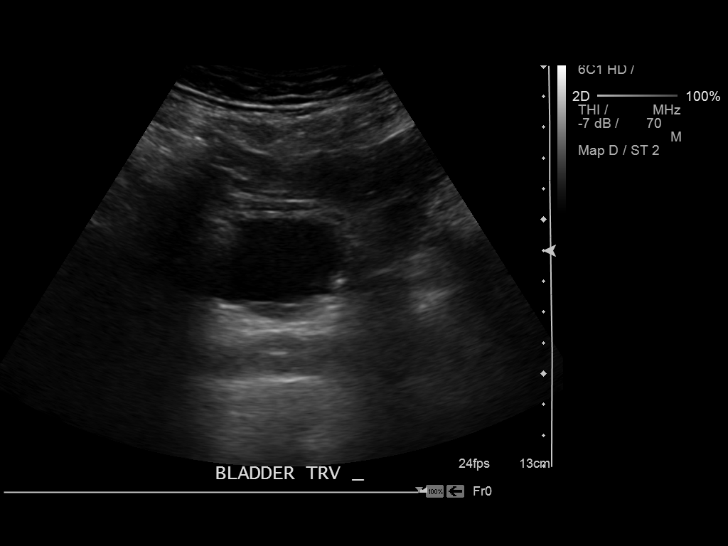
[im 14/25]
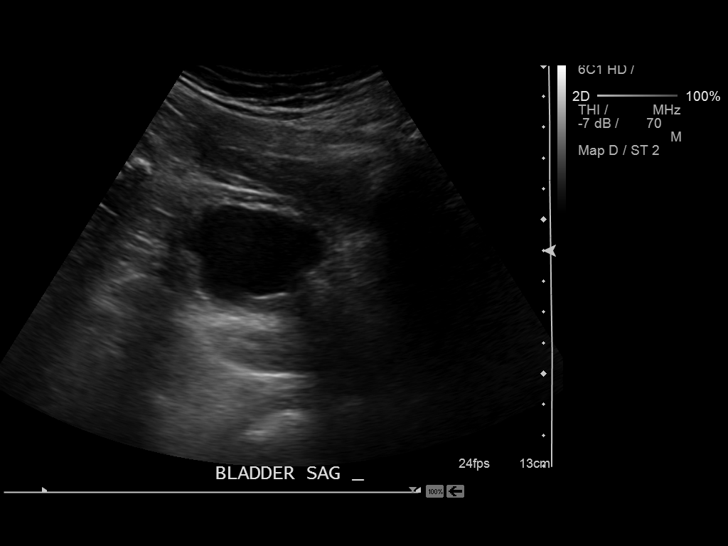
[im 16/25]
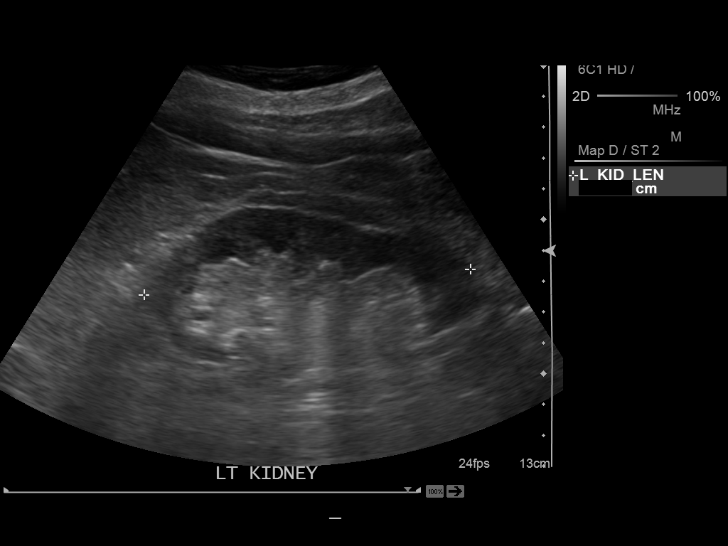
[im 17/25]
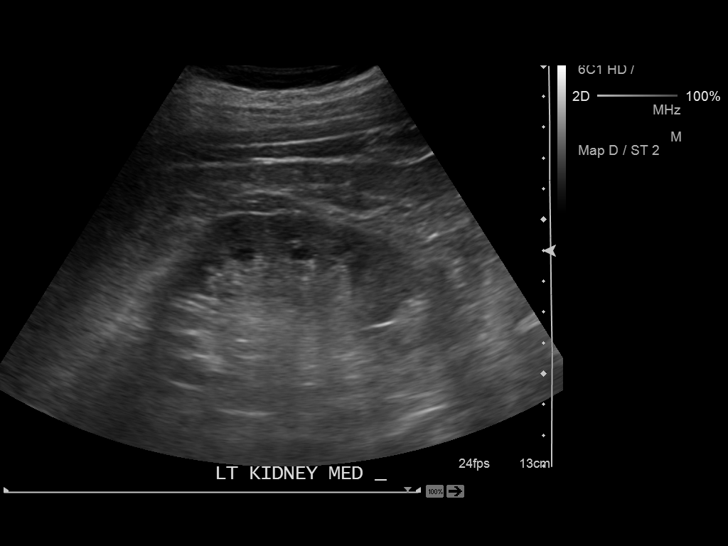
[im 19/25]
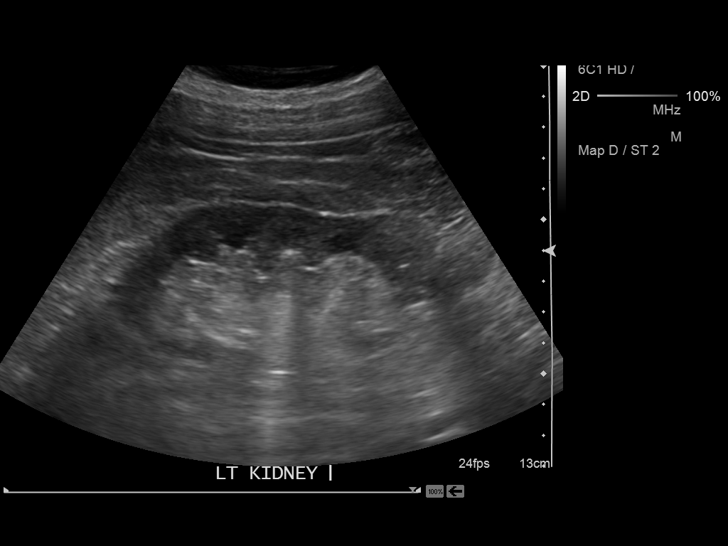
[im 21/25]
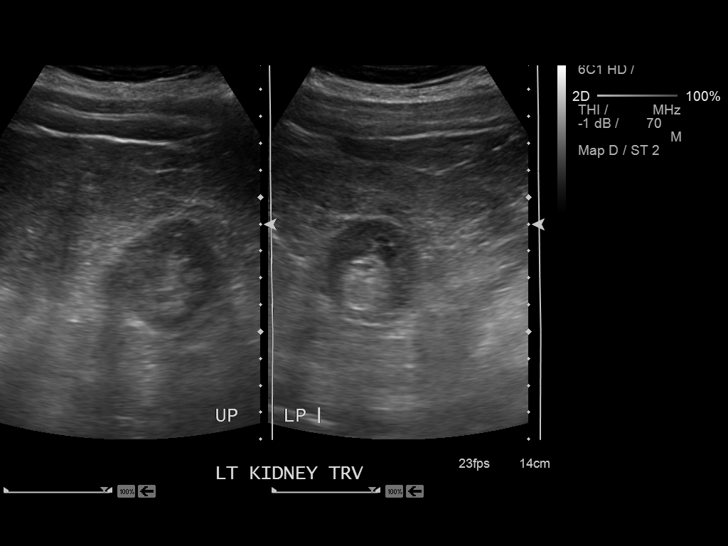
[im 23/25]
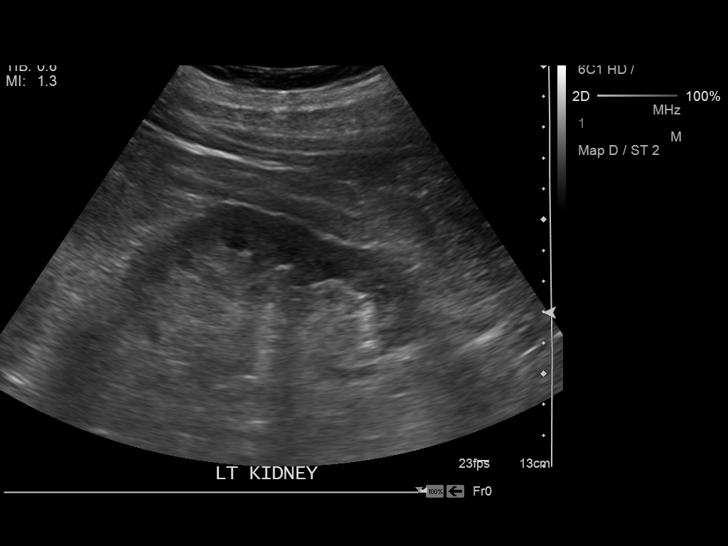
[im 25/25]
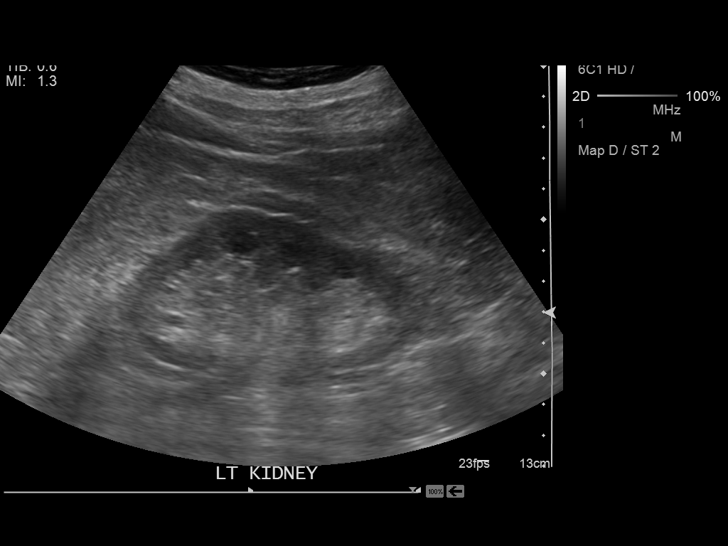

[14 of 25 positions shown; findings below may reference images not displayed]

FINDINGS: Right Kidney:

Length: 10.3 cm. Echogenicity within normal limits. There is mild
renal cortical thinning. No mass, perinephric fluid, or
hydronephrosis visualized. No sonographically demonstrable calculus
or ureterectasis.

Left Kidney:

Length: 10.6 cm. Echogenicity within normal limits. There is renal
cortical thinning. No mass, perinephric fluid, or hydronephrosis
visualized. No sonographically demonstrable calculus or
ureterectasis.

Bladder:

Appears normal for degree of bladder distention.
IMPRESSION: Renal cortical thinning is noted bilaterally, a finding that is
consistent with medical renal disease. Renal echogenicity is normal
bilaterally. No mass or obstructing focus is identified on either
side.

## 2014-12-04 MED ORDER — GLIPIZIDE 5 MG PO TABS: 5.0000 mg | ORAL_TABLET | Freq: Every day | ORAL | Status: DC

## 2014-12-04 MED ORDER — LUBIPROSTONE 8 MCG PO CAPS: ORAL_CAPSULE | ORAL | Status: DC

## 2014-12-04 NOTE — Telephone Encounter (Signed)
Note faxed asking when [facility] wants to see patient or recheck labs.

## 2014-12-04 NOTE — Assessment & Plan Note (Signed)
Restart glipizide.  Will have patient update me re: sugars in about 1 week.

## 2014-12-04 NOTE — Telephone Encounter (Signed)
He was Fe deficiency recently but I don't see Fe on his med list. Did he get IV Fe or an adequate course of PO Fe or does he need more? Would be sure he is adequately Fe replaced first.  If he has persistent heme + stool or gross bleeding CE would be indicated

## 2014-12-04 NOTE — Telephone Encounter (Signed)
Lugene-  1. Please send labs to [facility] for their input on patient's Cr.  I'd like to know when they want to see him/recheck his labs.  2. Please call pt, restart glipizide 5mg  a day since he was off med.  Update me re: sugars in about 1 week.  We may need to inc that med thereafter.  3. Recheck labs here in about 3 months re: DM2.  4. Refills sent to [facility].   Routed note to GI also- patient still with anemia noted w/o visibile blood in stool or stool changes noted- is patient a candidate for capsule endoscopy?  Appreciate GI input.

## 2014-12-04 NOTE — Assessment & Plan Note (Signed)
He has ranged fom 1.6-2, so Cr not far from baseline. I'll ask for renal input. D/w pt. >25 minutes spent in face to face time with patient, >50% spent in counselling or coordination of care.

## 2014-12-04 NOTE — Assessment & Plan Note (Signed)
Will ask for GI input for f/u study.  D/w pt.  He agrees.

## 2014-12-04 NOTE — Telephone Encounter (Signed)
Labs sent to [facility].  Patient advised to restart Glipizide 5 mg and report sugars in 1 week.  Lab appt scheduled.

## 2014-12-05 MED ORDER — FERROUS SULFATE 325 (65 FE) MG PO TABS: 325.0000 mg | ORAL_TABLET | Freq: Two times a day (BID) | ORAL | Status: DC

## 2014-12-05 NOTE — Telephone Encounter (Signed)
Call pt.  See note below.  I don't think he has been on iron.  If so, I need to know.  If not recently on iron, then start taking OTC iron 325mg  twice a day.  Note that taking iron may give him dark/black stools.  Recheck labs in 2 weeks, orders are in.   As long as HGB isn't worse next time (and assuming he doesn't have gross blood in stool), we'll go from there.   If HGB is sig worse or if gross bleeding, then we'll need to get him over to GI.  Thanks.

## 2014-12-05 NOTE — Telephone Encounter (Signed)
Patient notified as instructed by telephone and verbalized understanding. Patient stated that he has not been taking iron, but will start taking it. Patient stated that he is not having any bleeding at this time. Follow-up lab appointment scheduled as instructed.

## 2014-12-05 NOTE — Addendum Note (Signed)
Addended by: Tonia Ghent on: 12/05/2014 11:18 AM   Modules accepted: Orders

## 2014-12-19 ENCOUNTER — Other Ambulatory Visit (INDEPENDENT_AMBULATORY_CARE_PROVIDER_SITE_OTHER): Payer: Medicare Other

## 2014-12-19 DIAGNOSIS — D509 Iron deficiency anemia, unspecified: Secondary | ICD-10-CM | POA: Diagnosis not present

## 2014-12-19 LAB — CBC WITH DIFFERENTIAL/PLATELET
BASOS PCT: 0.7 % (ref 0.0–3.0)
Basophils Absolute: 0 10*3/uL (ref 0.0–0.1)
EOS ABS: 0.2 10*3/uL (ref 0.0–0.7)
EOS PCT: 3.2 % (ref 0.0–5.0)
HEMATOCRIT: 35 % — AB (ref 39.0–52.0)
HEMOGLOBIN: 11.4 g/dL — AB (ref 13.0–17.0)
LYMPHS ABS: 1.9 10*3/uL (ref 0.7–4.0)
Lymphocytes Relative: 28 % (ref 12.0–46.0)
MCHC: 32.7 g/dL (ref 30.0–36.0)
MCV: 80.1 fl (ref 78.0–100.0)
MONOS PCT: 5.6 % (ref 3.0–12.0)
Monocytes Absolute: 0.4 10*3/uL (ref 0.1–1.0)
NEUTROS ABS: 4.2 10*3/uL (ref 1.4–7.7)
Neutrophils Relative %: 62.5 % (ref 43.0–77.0)
Platelets: 141 10*3/uL — ABNORMAL LOW (ref 150.0–400.0)
RBC: 4.37 Mil/uL (ref 4.22–5.81)
RDW: 21.5 % — AB (ref 11.5–15.5)
WBC: 6.8 10*3/uL (ref 4.0–10.5)

## 2014-12-19 LAB — IBC PANEL
IRON: 307 ug/dL — AB (ref 42–165)
Saturation Ratios: 68.3 % — ABNORMAL HIGH (ref 20.0–50.0)
TRANSFERRIN: 321 mg/dL (ref 212.0–360.0)

## 2014-12-20 ENCOUNTER — Other Ambulatory Visit: Payer: Self-pay | Admitting: Family Medicine

## 2014-12-20 DIAGNOSIS — D649 Anemia, unspecified: Secondary | ICD-10-CM

## 2014-12-21 ENCOUNTER — Encounter: Payer: Self-pay | Admitting: Family Medicine

## 2014-12-21 ENCOUNTER — Telehealth: Payer: Self-pay | Admitting: Family Medicine

## 2014-12-21 ENCOUNTER — Ambulatory Visit (INDEPENDENT_AMBULATORY_CARE_PROVIDER_SITE_OTHER): Payer: Medicare Other | Admitting: Family Medicine

## 2014-12-21 VITALS — BP 154/64 | HR 72 | Temp 97.7°F | Wt 201.5 lb

## 2014-12-21 DIAGNOSIS — N189 Chronic kidney disease, unspecified: Secondary | ICD-10-CM

## 2014-12-21 DIAGNOSIS — E1122 Type 2 diabetes mellitus with diabetic chronic kidney disease: Secondary | ICD-10-CM

## 2014-12-21 DIAGNOSIS — I6523 Occlusion and stenosis of bilateral carotid arteries: Secondary | ICD-10-CM

## 2014-12-21 DIAGNOSIS — D649 Anemia, unspecified: Secondary | ICD-10-CM

## 2014-12-21 DIAGNOSIS — H532 Diplopia: Secondary | ICD-10-CM

## 2014-12-21 MED ORDER — GLIPIZIDE 5 MG PO TABS
10.0000 mg | ORAL_TABLET | Freq: Every day | ORAL | Status: DC
Start: 2014-12-21 — End: 2015-01-02

## 2014-12-21 NOTE — Patient Instructions (Signed)
Follow up lab appointment in about 3 weeks for your blood counts.  Dean Murphy will call about your referral. Take 2 of the glipzide pills a day for now.  Update me with your sugar early next week.  We'll make plans at that point.  Take care.  If you have any new weakness or other changes, then go to the ER.

## 2014-12-21 NOTE — Telephone Encounter (Signed)
Patient returned Lugene's call. °

## 2014-12-21 NOTE — Progress Notes (Signed)
Pre visit review using our clinic review tool, if applicable. No additional management support is needed unless otherwise documented below in the visit note.  3 issues:   Anemia.  Hgb improved but not back to baseline.  Iron stores now up.  D/w pt.  Will be due for f/u labs in a few weeks.  D/w pt.  No bleeding.   DM2.  Had been cut back from 10 to 5mg  of glipizide ~the last hospitalization.  His sugar has been high ever since, up to 300s.  compliant with meds. No ADE on meds.    2 episodes of double vision.  Each episode self resolved after about 10 minutes.  His vision was clear but double.  It was a horizontal split.  One episode was >1 week ago, the other was earlier in the month.   Prev with carotid exam done, recently.  D/w pt.  No CP, not SOB, no BLE edema.  No other focal neuro sx, then or now.  Vision is normal now.   Meds, vitals, and allergies reviewed.   ROS: See HPI.  Otherwise, noncontributory.  GEN: nad, alert and oriented HEENT: mucous membranes moist, PERRL, EOMI NECK: supple w/o LA CV: rrr.   PULM: ctab, no inc wob ABD: soft, +bs EXT: no edema SKIN: no acute rash CN 2-12 wnl B, S/S/DTR wnl x4

## 2014-12-23 DIAGNOSIS — H532 Diplopia: Secondary | ICD-10-CM | POA: Insufficient documentation

## 2014-12-23 NOTE — Assessment & Plan Note (Addendum)
Likely 6th nerve sx, possible TIA, d/w pt.  No sx now.  He can f/u with the eye clinic.  Check CT head nonemergently (that is okay given the timeline).  Given the possible TIA, would continue current meds (already on anticoagulation) and will work on DM2 control, see above.  If more sx, to ER.  He agrees. Path/phys d/w pt.

## 2014-12-23 NOTE — Assessment & Plan Note (Signed)
Inc glipizide back to 10mg  in AM and he'll update me in a few days about his sugars.  He agrees.

## 2014-12-23 NOTE — Assessment & Plan Note (Signed)
Hgb improved but not back to baseline. Iron stores now up. D/w pt. Will be due for f/u labs in a few weeks. D/w pt. No bleeding.

## 2014-12-24 ENCOUNTER — Ambulatory Visit (INDEPENDENT_AMBULATORY_CARE_PROVIDER_SITE_OTHER)
Admission: RE | Admit: 2014-12-24 | Discharge: 2014-12-24 | Disposition: A | Discharge status: Home / Self Care | Payer: Medicare Other | Source: Ambulatory Visit | Attending: Family Medicine | Admitting: Family Medicine

## 2014-12-24 DIAGNOSIS — H532 Diplopia: Secondary | ICD-10-CM

## 2014-12-24 DIAGNOSIS — I639 Cerebral infarction, unspecified: Secondary | ICD-10-CM | POA: Insufficient documentation

## 2014-12-24 DIAGNOSIS — Z8673 Personal history of transient ischemic attack (TIA), and cerebral infarction without residual deficits: Secondary | ICD-10-CM | POA: Insufficient documentation

## 2014-12-24 HISTORY — DX: Cerebral infarction, unspecified: I63.9

## 2014-12-26 ENCOUNTER — Other Ambulatory Visit: Payer: Self-pay | Admitting: Family Medicine

## 2014-12-26 DIAGNOSIS — I480 Paroxysmal atrial fibrillation: Secondary | ICD-10-CM

## 2014-12-26 MED ORDER — SITAGLIPTIN PHOSPHATE 50 MG PO TABS: 50.0000 mg | ORAL_TABLET | Freq: Every day | ORAL | Status: DC

## 2015-01-02 ENCOUNTER — Telehealth: Payer: Self-pay

## 2015-01-02 MED ORDER — GLIPIZIDE 5 MG PO TABS: 10.0000 mg | ORAL_TABLET | Freq: Every day | ORAL | Status: DC

## 2015-01-02 NOTE — Telephone Encounter (Signed)
Patient notified and verbalized understanding. 

## 2015-01-02 NOTE — Telephone Encounter (Signed)
Pt left v/m with BS readings; 12/26/14 BS 273;  12/27/14 BS 351;  12/28/14 BS 257;  12/29/14 BS 256;  12/30/14 BS 282;  12/31/14 BS 265; 01/01/15 BS 278 and 01/02/15 BS 211. Pt started on Januvia on 12/26/14.

## 2015-01-02 NOTE — Telephone Encounter (Signed)
I would add on an extra glipizide before supper (ie still with 10mg  in AM, add on 5mg  before supper).   Update me in a few days about his sugars.   I am hopefull that 211 is the onset of [place].  Thanks.

## 2015-01-07 ENCOUNTER — Ambulatory Visit (INDEPENDENT_AMBULATORY_CARE_PROVIDER_SITE_OTHER): Payer: Medicare Other | Admitting: Cardiology

## 2015-01-07 ENCOUNTER — Encounter: Payer: Self-pay | Admitting: Cardiology

## 2015-01-07 VITALS — BP 162/62 | HR 67 | Ht 69.0 in | Wt 202.0 lb

## 2015-01-07 DIAGNOSIS — I5032 Chronic diastolic (congestive) heart failure: Secondary | ICD-10-CM | POA: Diagnosis not present

## 2015-01-07 DIAGNOSIS — I639 Cerebral infarction, unspecified: Secondary | ICD-10-CM | POA: Diagnosis not present

## 2015-01-07 DIAGNOSIS — E785 Hyperlipidemia, unspecified: Secondary | ICD-10-CM

## 2015-01-07 DIAGNOSIS — I10 Essential (primary) hypertension: Secondary | ICD-10-CM

## 2015-01-07 DIAGNOSIS — I480 Paroxysmal atrial fibrillation: Secondary | ICD-10-CM | POA: Diagnosis not present

## 2015-01-07 DIAGNOSIS — I442 Atrioventricular block, complete: Secondary | ICD-10-CM

## 2015-01-07 MED ORDER — DILTIAZEM HCL ER COATED BEADS 360 MG PO CP24: 360.0000 mg | ORAL_CAPSULE | Freq: Every day | ORAL | Status: DC

## 2015-01-07 NOTE — Patient Instructions (Signed)
**Note De-Identified  Obfuscation** Medication Instructions:  Increase Cardizem to 360 mg daily  Labwork: None  Testing/Procedures: None  Follow-Up: Your physician wants you to follow-up in: 6 months. You will receive a reminder letter in the mail two months in advance. If you don't receive a letter, please call our office to schedule the follow-up appointment.

## 2015-01-07 NOTE — Progress Notes (Signed)
Patient ID: Dean Murphy, male   DOB: 1926/10/21, 81 y.o.   MRN: [id number]      Electrophysiology Office Note Date: 01/07/2015  ID:  Dean Murphy, DOB [birth year], MRN [id number]  PCP: Elsie Stain, MD Primary Cardiologist: Meda Coffee Electrophysiologist: Lovena Le  CC: Pacemaker post hospital follow-up, CHF  Dean Murphy is a 81 y.o. male is seen today 2 months post PM implantation for CHB complicated by acute diastolic CHF. He was seen by [name] a months ago and his PM function is normal.   He was seen by pulmonary who recommended short term home O2 with re-evaluation after discharge. This has improved and he is off home O2.   He feels significantly better than before, he denies chest pain, palpitations, dyspnea, PND, orthopnea, nausea, vomiting, syncope. He works up in the yard with no difficulties, takes lasix PRN.  PM working properly in June 2016, next check in September.   Device History: STJ dual chamber PPM implanted 07-2014 for complete heart block by [name]   Past Medical History  Diagnosis Date  . Diabetes mellitus   . GERD (gastroesophageal reflux disease)   . Hyperlipidemia   . Hypertension   . Arthritis   . Diverticulosis   . Hemorrhoids   . Colon polyps   . CAD (coronary artery disease)     CABG 1989, Myoview low risk 2012  . CHB (complete heart block) March 2016    a. s/p STJ dual chamber pacemaker  . Osteoporosis   . Chronic kidney disease, stage IV (severe)     stage III/IV as of 2015  . Atrial fibrillation March 2016  . CHF (congestive heart failure)   . Carotid artery occlusion   . Myocardial infarction 1976  and Mar. 19, 2016  . CVA (cerebral infarction)    Past Surgical History  Procedure Laterality Date  . Coronary artery bypass graft    . Cholecystectomy    . Total knee arthroplasty      bilateral  . Tonsillectomy    . Carotid endarterectomy  12/31/10    Right  . Carotid endarterectomy  02/09/11    left  . Joint replacement      bilat.  knees  . Cardiac catheterization  08/11/2014    Procedure: TEMPORARY PACEMAKER;  Surgeon: [name], MD;  Location: [facility] CATH LAB;  Service: Cardiovascular;;  . Permanent pacemaker insertion N/A 08/13/2014    STJ dual chamber pacemaker implanted by [name] for CHB    Current Outpatient Prescriptions  Medication Sig Dispense Refill  . apixaban (ELIQUIS) 2.5 MG TABS tablet Take 1 tablet (2.5 mg total) by mouth 2 (two) times daily. 90 tablet 3  . benazepril (LOTENSIN) 20 MG tablet TAKE 1 TABLET DAILY (Patient taking differently: TAKE 1 TABLET BY MOUTH DAILY) 90 tablet 3  . diltiazem (CARDIZEM CD) 240 MG 24 hr capsule Take 1 capsule (240 mg total) by mouth daily. 90 capsule 3  . furosemide (LASIX) 40 MG tablet Take 1 tablet (40 mg total) by mouth daily as needed. (Patient taking differently: Take 40 mg by mouth daily as needed (swelling). ) 30 tablet 3  . glipiZIDE (GLUCOTROL) 5 MG tablet Take 2 tablets (10 mg total) by mouth daily before breakfast. And 1 tab before supper    . lubiprostone (AMITIZA) 8 MCG capsule TAKE 1 CAPSULE BY MOUTH  DAILY WITH  BREAKFAST 90 capsule 3  . NON FORMULARY daily. careconcepts lancets an d glucometer strips    .  omeprazole (PRILOSEC) 40 MG capsule Take 1 capsule (40 mg total) by mouth daily. 30 capsule 11  . rosuvastatin (CRESTOR) 20 MG tablet Take 1 tablet (20 mg total) by mouth daily. 90 tablet 3  . sitaGLIPtin (JANUVIA) 50 MG tablet Take 1 tablet (50 mg total) by mouth daily. 30 tablet 5   No current facility-administered medications for this visit.    Allergies:   Reclast   Social History: Social History   Social History  . Marital Status: Married    Spouse Name: N/A  . Number of Children: N/A  . Years of Education: N/A   Occupational History  . retired    Social History Main Topics  . Smoking status: Former Smoker -- 1.00 packs/day for 25 years    Types: Cigarettes    Quit date: 01/21/1973  . Smokeless tobacco: Never Used     Comment:  began smoking in high school, quit age 59  . Alcohol Use: 0.0 oz/week    0 Standard drinks or equivalent per week     Comment: 1-2 per week  . Drug Use: No  . Sexual Activity: Not on file   Other Topics Concern  . 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  Remarried 80.     3 sons    Family History: Family History  Problem Relation Age of Onset  . Diabetes    . Cancer    . Heart disease Mother     Before age 69  . Diabetes Mother   . Varicose Veins Mother   . Heart attack Mother   . Heart attack Father   . Heart disease Father     After age 69  . Hypertension Father   . Colon cancer Neg Hx   . Heart disease Brother     Before age 35  . Hypertension Brother   . Heart attack Brother   . Diabetes Son   . Hypertension Son   . Hypertension Son   . Diabetes Son      Review of Systems: All other systems reviewed and are otherwise negative except as noted above.   Physical Exam: VS:  BP 162/62 mmHg  Pulse 67  Ht 5\' 9"  (1.753 m)  Wt 202 lb (91.627 kg)  BMI 29.82 kg/m2  SpO2 96% , BMI Body mass index is 29.82 kg/(m^2).  GEN- The patient is well appearing, alert and oriented x 3 today.   HEENT: normocephalic, atraumatic; sclera clear, conjunctiva pink; hearing intact; oropharynx clear; neck supple Lungs- Clear to ausculation bilaterally, normal work of breathing.  No wheezes, rales, rhonchi Heart- Regular rate and rhythm, 2/6 SEM GI- soft, non-tender, non-distended, bowel sounds present  Extremities- no clubbing, cyanosis, or edema; DP/PT/radial pulses 2+ bilaterally MS- no significant deformity or atrophy Skin- warm and dry, no rash or lesion; PPM pocket well healed Psych- euthymic mood, full affect Neuro- strength and sensation are intact  PPM Interrogation- reviewed in detail today,  See PACEART report  EKG:  EKG is not ordered today.  Recent Labs: 08/11/2014: B Natriuretic  Peptide 684.3* 08/13/2014: TSH 1.582 11/29/2014: ALT 17; BUN 32*; Creatinine, Ser 2.12*; Potassium 5.0; Sodium 140 12/19/2014: Hemoglobin 11.4*; Platelets 141.0*   Wt Readings from Last 3 Encounters:  01/07/15 202 lb (91.627 kg)  12/21/14 201 lb 8 oz (91.4 kg)  12/03/14 204 lb 8 oz (92.761 kg)    Other  studies Reviewed: Additional studies/ records that were reviewed today include: hospital records  ECG: SR, LBBB, PVCs, intermittent V-pacing    Assessment and Plan:  1.  Complete heart block Normal PPM function Pacemaker pocket well healed  2.  Paroxysmal atrial fibrillation Identified on recent hospital admission AF burden by device interrogation today <1% Restarted Eliquis 2.5 mg po BID for CHADS2VASC of 5, no bleeding  3.  Acute on chronic renal failure Stable crea 2.1, lasix only PRN  4.  Chronic diastolic heart failure Improved post pacing Continue lasix 40 mg po daily as needed  5.  CAD No recent ischemic symptoms  6. HLP - restarted crestor 20 mg po daily  7. HTN - we will increase cardizem to 360 mg po daily, he had orthostasis in the past, if he doesn't tolerate we will decrease to 240 mg po daily  Disposition:   Follow up with Dr Meda Coffee in 6 months   Dorothy Spark, MD  [place] [address] [place] [address] [phone] (office) [phone] (fax)

## 2015-01-09 IMAGING — CR DG FOOT 2V*L*
2 series · 2 of 2 positions shown · non-contrast
Comparison: None.

CLINICAL DATA: Left foot pain and swelling, acute

EXAM:
LEFT FOOT - 2 VIEW

[view not recorded (1 of 2)]
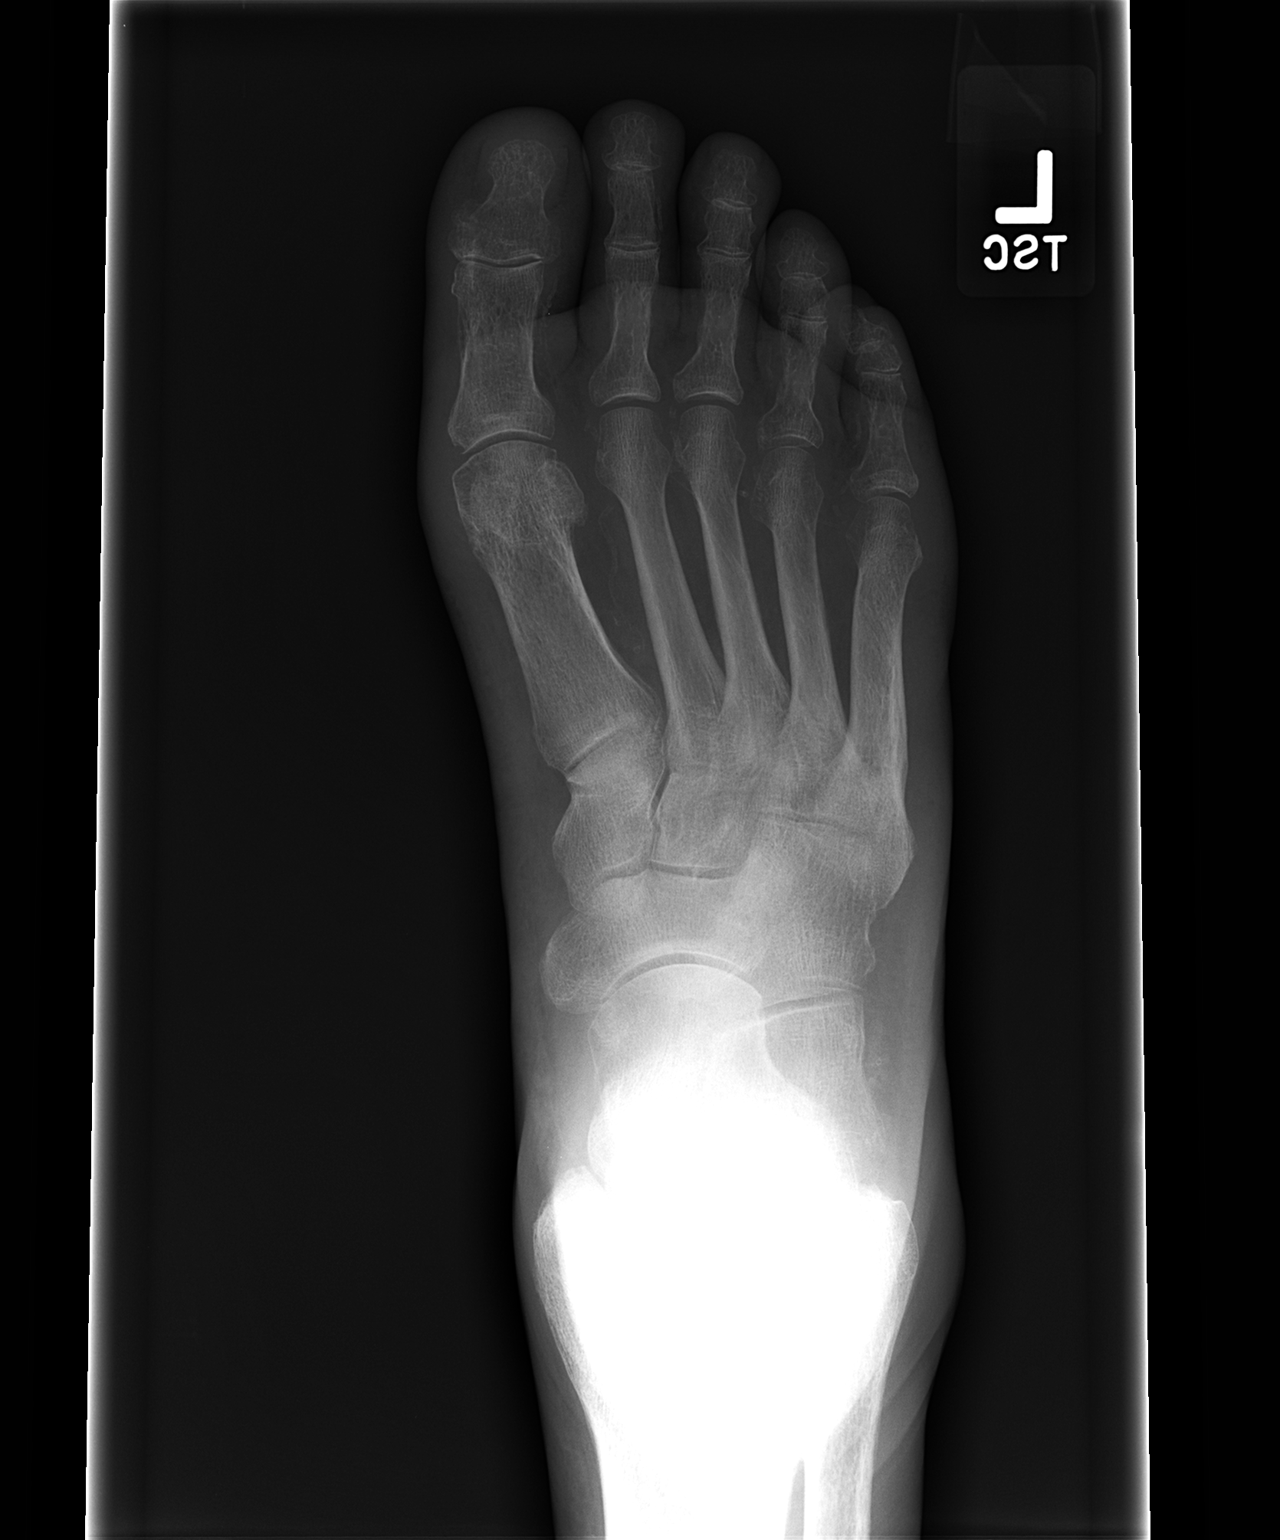

[view not recorded (2 of 2)]
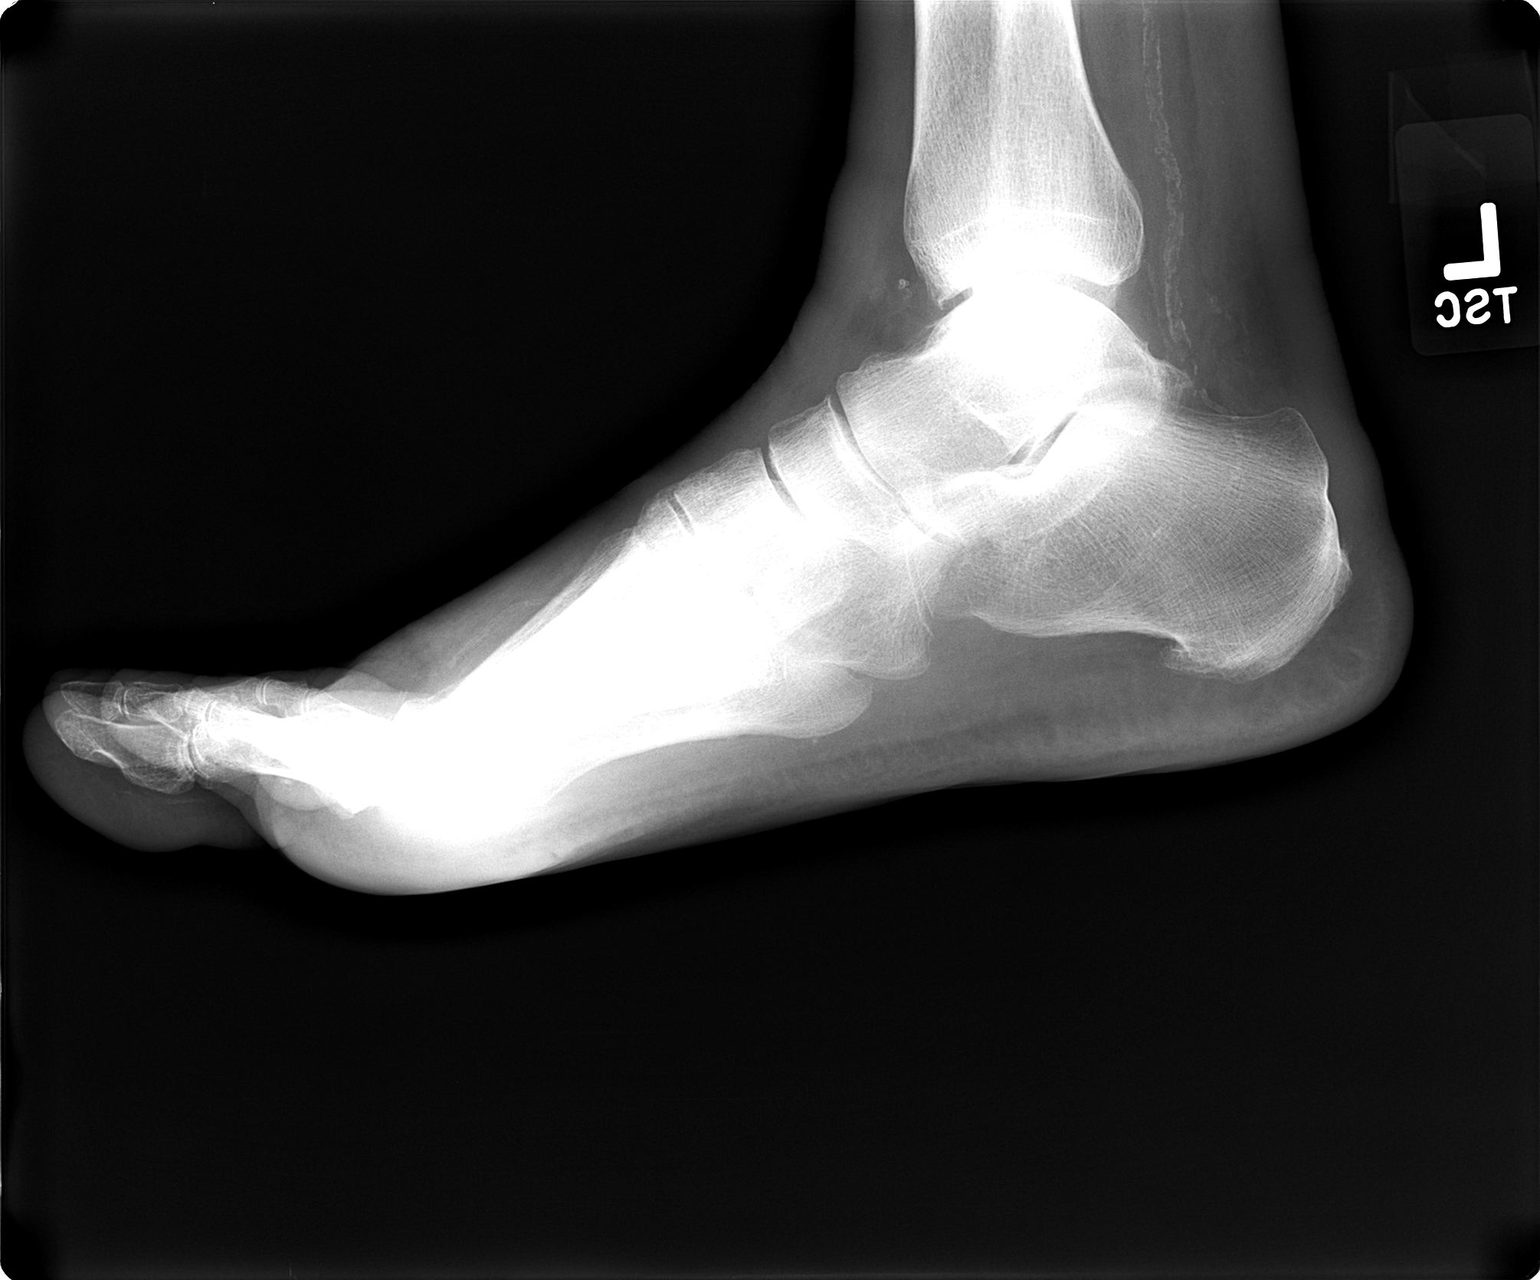

[2 of 2 positions shown; findings below may reference images not displayed]

FINDINGS: Frontal and lateral views were obtained. Bones appear osteoporotic.
There is mild narrowing of all PIP and DIP joints as well as the
first MTP joint. No fracture or dislocation. No erosive change.
There is a spur arising from the inferior calcaneus. There are
multiple foci of arterial vascular calcification. There is soft
tissue swelling in the forefoot region.
IMPRESSION: Forefoot region soft tissue edema. No fracture or dislocation.
Narrowing of multiple distal joints. Small inferior calcaneus spur.
No erosive change or bony destruction.

## 2015-01-11 ENCOUNTER — Other Ambulatory Visit (INDEPENDENT_AMBULATORY_CARE_PROVIDER_SITE_OTHER): Payer: Medicare Other

## 2015-01-11 DIAGNOSIS — D649 Anemia, unspecified: Secondary | ICD-10-CM | POA: Diagnosis not present

## 2015-01-11 LAB — CBC WITH DIFFERENTIAL/PLATELET
BASOS PCT: 0.4 % (ref 0.0–3.0)
Basophils Absolute: 0 10*3/uL (ref 0.0–0.1)
EOS PCT: 2.4 % (ref 0.0–5.0)
Eosinophils Absolute: 0.2 10*3/uL (ref 0.0–0.7)
HCT: 35.8 % — ABNORMAL LOW (ref 39.0–52.0)
Hemoglobin: 11.8 g/dL — ABNORMAL LOW (ref 13.0–17.0)
LYMPHS ABS: 1.7 10*3/uL (ref 0.7–4.0)
Lymphocytes Relative: 26.6 % (ref 12.0–46.0)
MCHC: 33 g/dL (ref 30.0–36.0)
MCV: 82.1 fl (ref 78.0–100.0)
MONOS PCT: 6.9 % (ref 3.0–12.0)
Monocytes Absolute: 0.5 10*3/uL (ref 0.1–1.0)
NEUTROS ABS: 4.2 10*3/uL (ref 1.4–7.7)
NEUTROS PCT: 63.7 % (ref 43.0–77.0)
Platelets: 131 10*3/uL — ABNORMAL LOW (ref 150.0–400.0)
RBC: 4.36 Mil/uL (ref 4.22–5.81)
RDW: 22.1 % — ABNORMAL HIGH (ref 11.5–15.5)
WBC: 6.6 10*3/uL (ref 4.0–10.5)

## 2015-01-11 LAB — IBC PANEL
Iron: 38 ug/dL — ABNORMAL LOW (ref 42–165)
Saturation Ratios: 8.4 % — ABNORMAL LOW (ref 20.0–50.0)
Transferrin: 322 mg/dL (ref 212.0–360.0)

## 2015-01-21 ENCOUNTER — Telehealth: Payer: Self-pay | Admitting: *Deleted

## 2015-01-21 MED ORDER — GLIPIZIDE 5 MG PO TABS: 10.0000 mg | ORAL_TABLET | Freq: Every day | ORAL | Status: DC

## 2015-01-21 NOTE — Telephone Encounter (Signed)
Please clarify with pharmacy.   Had rx for 5mg  tabs prev, dose was upped to 2 in AM, 1 in PM (ie 10mg  in AM, with 5mg  before supper). Let me know if new rx needs to be sent.  Thanks.

## 2015-01-21 NOTE — Telephone Encounter (Signed)
Received faxed note from pharmacy stating that they need a new script for Glipizide 10 mg once before breakfast.  Patient advised the pharmacy that the script has been changed. Please confirm that patient is to continue 10 mg daily? See note 01/02/15.

## 2015-01-21 NOTE — Telephone Encounter (Signed)
Amy at [facility] advised.

## 2015-02-12 DIAGNOSIS — I10 Essential (primary) hypertension: Secondary | ICD-10-CM | POA: Diagnosis not present

## 2015-02-12 DIAGNOSIS — N183 Chronic kidney disease, stage 3 (moderate): Secondary | ICD-10-CM | POA: Diagnosis not present

## 2015-02-12 DIAGNOSIS — E1129 Type 2 diabetes mellitus with other diabetic kidney complication: Secondary | ICD-10-CM | POA: Diagnosis not present

## 2015-02-14 ENCOUNTER — Ambulatory Visit (INDEPENDENT_AMBULATORY_CARE_PROVIDER_SITE_OTHER): Payer: Medicare Other | Admitting: *Deleted

## 2015-02-14 DIAGNOSIS — I442 Atrioventricular block, complete: Secondary | ICD-10-CM | POA: Diagnosis not present

## 2015-02-14 NOTE — Progress Notes (Signed)
Remote pacemaker transmission.   

## 2015-02-18 LAB — CUP PACEART REMOTE DEVICE CHECK
Battery Remaining Longevity: 120 mo
Battery Remaining Percentage: 95.5 %
Brady Statistic AP VP Percent: 34 %
Brady Statistic AP VS Percent: 1 %
Brady Statistic AS VP Percent: 65 %
Brady Statistic AS VS Percent: 1 %
Brady Statistic RV Percent Paced: 99 %
Date Time Interrogation Session: 20160922060016
Lead Channel Impedance Value: 510 Ohm
Lead Channel Pacing Threshold Amplitude: 0.5 V
Lead Channel Pacing Threshold Amplitude: 0.75 V
Lead Channel Pacing Threshold Pulse Width: 0.5 ms
Lead Channel Sensing Intrinsic Amplitude: 12 mV
Lead Channel Sensing Intrinsic Amplitude: 3.4 mV
Lead Channel Setting Pacing Amplitude: 0.75 V
Lead Channel Setting Pacing Amplitude: 2 V
Lead Channel Setting Pacing Pulse Width: 0.5 ms
Lead Channel Setting Sensing Sensitivity: 4 mV
MDC IDC MSMT BATTERY VOLTAGE: 3.02 V
MDC IDC MSMT LEADCHNL RA IMPEDANCE VALUE: 440 Ohm
MDC IDC MSMT LEADCHNL RA PACING THRESHOLD PULSEWIDTH: 0.5 ms
MDC IDC PG SERIAL: 7734710
MDC IDC STAT BRADY RA PERCENT PACED: 34 %

## 2015-02-28 ENCOUNTER — Other Ambulatory Visit: Payer: Medicare Other

## 2015-03-05 ENCOUNTER — Ambulatory Visit: Payer: Medicare Other | Admitting: Family Medicine

## 2015-03-05 ENCOUNTER — Encounter: Payer: Self-pay | Admitting: Cardiology

## 2015-03-08 ENCOUNTER — Other Ambulatory Visit: Payer: Self-pay | Admitting: Family Medicine

## 2015-03-08 DIAGNOSIS — D649 Anemia, unspecified: Secondary | ICD-10-CM

## 2015-03-08 DIAGNOSIS — E1122 Type 2 diabetes mellitus with diabetic chronic kidney disease: Secondary | ICD-10-CM

## 2015-03-11 ENCOUNTER — Other Ambulatory Visit (INDEPENDENT_AMBULATORY_CARE_PROVIDER_SITE_OTHER): Payer: Medicare Other

## 2015-03-11 DIAGNOSIS — E1122 Type 2 diabetes mellitus with diabetic chronic kidney disease: Secondary | ICD-10-CM

## 2015-03-11 DIAGNOSIS — D649 Anemia, unspecified: Secondary | ICD-10-CM | POA: Diagnosis not present

## 2015-03-11 LAB — CBC WITH DIFFERENTIAL/PLATELET
Basophils Absolute: 0 10*3/uL (ref 0.0–0.1)
Basophils Relative: 0.4 % (ref 0.0–3.0)
Eosinophils Absolute: 0.4 10*3/uL (ref 0.0–0.7)
Eosinophils Relative: 4.3 % (ref 0.0–5.0)
HCT: 32.5 % — ABNORMAL LOW (ref 39.0–52.0)
Hemoglobin: 10.6 g/dL — ABNORMAL LOW (ref 13.0–17.0)
Lymphocytes Relative: 17.9 % (ref 12.0–46.0)
Lymphs Abs: 1.8 10*3/uL (ref 0.7–4.0)
MCHC: 32.8 g/dL (ref 30.0–36.0)
MCV: 82.6 fl (ref 78.0–100.0)
Monocytes Absolute: 0.7 10*3/uL (ref 0.1–1.0)
Monocytes Relative: 6.7 % (ref 3.0–12.0)
Neutro Abs: 7.2 10*3/uL (ref 1.4–7.7)
Neutrophils Relative %: 70.7 % (ref 43.0–77.0)
Platelets: 153 10*3/uL (ref 150.0–400.0)
RBC: 3.93 Mil/uL — ABNORMAL LOW (ref 4.22–5.81)
RDW: 16.6 % — ABNORMAL HIGH (ref 11.5–15.5)
WBC: 10.2 10*3/uL (ref 4.0–10.5)

## 2015-03-11 LAB — BASIC METABOLIC PANEL
BUN: 33 mg/dL — AB (ref 6–23)
CALCIUM: 9 mg/dL (ref 8.4–10.5)
CO2: 23 meq/L (ref 19–32)
CREATININE: 2.15 mg/dL — AB (ref 0.40–1.50)
Chloride: 108 mEq/L (ref 96–112)
GFR: 31.13 mL/min — ABNORMAL LOW (ref >60.00)
Glucose, Bld: 221 mg/dL — ABNORMAL HIGH (ref 70–99)
Potassium: 4.6 mEq/L (ref 3.5–5.1)
Sodium: 141 mEq/L (ref 135–145)

## 2015-03-11 LAB — IBC PANEL
IRON: 86 ug/dL (ref 42–165)
SATURATION RATIOS: 21.8 % (ref 20.0–50.0)
TRANSFERRIN: 282 mg/dL (ref 212.0–360.0)

## 2015-03-11 LAB — HEMOGLOBIN A1C: Hgb A1c MFr Bld: 10 % — ABNORMAL HIGH (ref 4.6–6.5)

## 2015-03-14 ENCOUNTER — Encounter: Payer: Self-pay | Admitting: Internal Medicine

## 2015-03-14 DIAGNOSIS — H349 Unspecified retinal vascular occlusion: Secondary | ICD-10-CM | POA: Diagnosis not present

## 2015-03-14 DIAGNOSIS — E119 Type 2 diabetes mellitus without complications: Secondary | ICD-10-CM | POA: Diagnosis not present

## 2015-03-14 DIAGNOSIS — Z0100 Encounter for examination of eyes and vision without abnormal findings: Secondary | ICD-10-CM | POA: Diagnosis not present

## 2015-03-14 DIAGNOSIS — H401131 Primary open-angle glaucoma, bilateral, mild stage: Secondary | ICD-10-CM | POA: Diagnosis not present

## 2015-03-14 LAB — HM DIABETES EYE EXAM

## 2015-03-15 ENCOUNTER — Telehealth: Payer: Self-pay | Admitting: Family Medicine

## 2015-03-15 ENCOUNTER — Encounter: Payer: Self-pay | Admitting: Family Medicine

## 2015-03-15 ENCOUNTER — Ambulatory Visit (INDEPENDENT_AMBULATORY_CARE_PROVIDER_SITE_OTHER): Payer: Medicare Other | Admitting: Family Medicine

## 2015-03-15 VITALS — BP 122/58 | HR 73 | Temp 97.5°F | Wt 205.2 lb

## 2015-03-15 DIAGNOSIS — D649 Anemia, unspecified: Secondary | ICD-10-CM | POA: Diagnosis not present

## 2015-03-15 DIAGNOSIS — Z23 Encounter for immunization: Secondary | ICD-10-CM | POA: Diagnosis not present

## 2015-03-15 DIAGNOSIS — N183 Chronic kidney disease, stage 3 unspecified: Secondary | ICD-10-CM

## 2015-03-15 DIAGNOSIS — I10 Essential (primary) hypertension: Secondary | ICD-10-CM

## 2015-03-15 DIAGNOSIS — E1122 Type 2 diabetes mellitus with diabetic chronic kidney disease: Secondary | ICD-10-CM

## 2015-03-15 DIAGNOSIS — I639 Cerebral infarction, unspecified: Secondary | ICD-10-CM | POA: Diagnosis not present

## 2015-03-15 DIAGNOSIS — Z951 Presence of aortocoronary bypass graft: Secondary | ICD-10-CM

## 2015-03-15 MED ORDER — LUBIPROSTONE 8 MCG PO CAPS: ORAL_CAPSULE | ORAL | Status: DC

## 2015-03-15 MED ORDER — AMLODIPINE BESYLATE 5 MG PO TABS: 2.5000 mg | ORAL_TABLET | Freq: Every day | ORAL | Status: DC

## 2015-03-15 MED ORDER — INSULIN PEN NEEDLE 31G X 5 MM MISC: Status: DC

## 2015-03-15 MED ORDER — INSULIN GLARGINE 100 UNIT/ML SOLOSTAR PEN: 5.0000 [IU] | PEN_INJECTOR | Freq: Every day | SUBCUTANEOUS | Status: DC

## 2015-03-15 MED ORDER — SITAGLIPTIN PHOSPHATE 50 MG PO TABS: 50.0000 mg | ORAL_TABLET | Freq: Every day | ORAL | Status: DC

## 2015-03-15 MED ORDER — AMLODIPINE BESYLATE 5 MG PO TABS: 5.0000 mg | ORAL_TABLET | Freq: Every day | ORAL | Status: DC

## 2015-03-15 MED ORDER — BENAZEPRIL HCL 20 MG PO TABS: 20.0000 mg | ORAL_TABLET | Freq: Every day | ORAL | Status: DC

## 2015-03-15 NOTE — Telephone Encounter (Signed)
I think patient wanted this to come through [facility].  Please verify with patient.   When pharmacy verified, okay to send 30 or 90 day supply per patient preference with 1 year of refills.  Thanks.

## 2015-03-15 NOTE — Patient Instructions (Signed)
Cut the amlodipine to 2.5mg  a day (1/2 of a 5mg  tab). Pick up the insulin and bring it in next week with the needles.  22min appointment next week.  I'll check with GI and cardiology in the meantime.  Limit exertion in the meantime.  Take care.  Glad to see you.

## 2015-03-15 NOTE — Progress Notes (Signed)
Pre visit review using our clinic review tool, if applicable. No additional management support is needed unless otherwise documented below in the visit note.  Fatigued.  No energy.  Persistent.  Not improved in the interval.   He doesn't feel overtly worried but he is frustrated.  Unclear if related to low BP.  He doesn't have frank chest pain but will have some exertional chest tightness.  He can still work some in the yard but less well than 2 months ago.  He feels well at rest.    Anemia.  HGB slightly lower.  D/w pt.  Iron repleted.  No bleeding and no black stools.    Sugar has been diffusely high. 200s usually.  A1c up.  Labs d/w pt.  Compliant with meds.    Meds, vitals, and allergies reviewed.   ROS: See HPI.  Otherwise, noncontributory.  GEN: nad, alert and oriented HEENT: mucous membranes moist NECK: supple w/o LA CV: rrr.  no murmur PULM: ctab, no inc wob ABD: soft, +bs EXT: trace edema

## 2015-03-15 NOTE — Telephone Encounter (Signed)
Spoke to patient and was advised that he does want everything to go through [facility] and request a 90 day supply. Refill sent to pharmacy as instructed.

## 2015-03-15 NOTE — Telephone Encounter (Signed)
Received faxed request from [facility] to change to 90 days supply for   sitagliptin (JANUVIA) 50 MG tablet   Take 1 tablet (50 mg total) by mouth daily.  Dispense   30 tablet   Refill  5  Last prescribed on 12/26/2014. Last seen on 03/15/2015. Next appt on 03/22/2015  [place]

## 2015-03-18 ENCOUNTER — Telehealth: Payer: Self-pay | Admitting: *Deleted

## 2015-03-18 NOTE — Assessment & Plan Note (Signed)
Stable cr. D/w pt.

## 2015-03-18 NOTE — Assessment & Plan Note (Signed)
Will need insulin start, rx sent, he'll pick up and bring to next OV, for teaching and injection.  He agrees.  >25 minutes spent in face to face time with patient, >50% spent in counselling or coordination of care.

## 2015-03-18 NOTE — Assessment & Plan Note (Signed)
I'll ask for cards input given the recent sx. Unclear if low BP is affecting his preload and therefore his exertional sx.  Still okay for outpatient f/u.  CP free at time of exam.  He agrees.  Will cut amlodipine to 2.5mg  in the meantime.

## 2015-03-18 NOTE — Assessment & Plan Note (Addendum)
I'll ask for GI input, given that his iron is repleted but his HGB is still low.  He has no melena or BRBPR.    Addendum- I checked with Dr. Fuller Plan- If he has overt bleeding, ongoing occult GI bleeding or iron deficiency that will not correct with appropriate iron replacement it would be reasonable to consider capsule endoscopy for further work up. At this point, his iron is repleted.  He may have anemia from CKD, etc.

## 2015-03-18 NOTE — Telephone Encounter (Signed)
Notified the pt that per Dr Meda Coffee and Dr Damita Dunnings, they wanted the pt to be seen in our office for issues with low hemoglobin, exertional chest tightness, and unknown origin of low BP.   Informed the pt that we can see him in the office on 03/27/15 at 1:30 pm, [place] with Melina Copa PA-C for these issues.  Pt verbalized understanding and agrees with this plan.  Pt agrees with date and time.

## 2015-03-18 NOTE — Telephone Encounter (Signed)
-----   Message from Dorothy Spark, MD sent at 03/18/2015  8:43 AM EDT ----- Karlene Einstein,  Could you please add him to the FLEX schedule in the next 2 weeks? Thank you, Houston Siren  ----- Message -----    From: Tonia Ghent, MD    Sent: 03/18/2015  12:38 AM      To: Ladene Artist, MD, Dorothy Spark, MD  I needs GI and cards input.  Is it worth further GI w/u?  hgb still low with iron repleted.  No black or red stools.  Recently with exertional chest tightness.  No sx at rest.  Unclear if exacerbated by low BP, I am cutting his amlodipine. When can he be seen at cards clinic? I am starting insulin in the meantime for DM2.  Thanks.  Dean Murphy

## 2015-03-22 ENCOUNTER — Ambulatory Visit (INDEPENDENT_AMBULATORY_CARE_PROVIDER_SITE_OTHER): Payer: Medicare Other | Admitting: Family Medicine

## 2015-03-22 ENCOUNTER — Encounter: Payer: Self-pay | Admitting: Family Medicine

## 2015-03-22 VITALS — BP 158/40 | HR 65 | Temp 98.0°F | Wt 205.0 lb

## 2015-03-22 DIAGNOSIS — I10 Essential (primary) hypertension: Secondary | ICD-10-CM

## 2015-03-22 DIAGNOSIS — I639 Cerebral infarction, unspecified: Secondary | ICD-10-CM | POA: Diagnosis not present

## 2015-03-22 DIAGNOSIS — D649 Anemia, unspecified: Secondary | ICD-10-CM | POA: Diagnosis not present

## 2015-03-22 DIAGNOSIS — E1122 Type 2 diabetes mellitus with diabetic chronic kidney disease: Secondary | ICD-10-CM | POA: Diagnosis not present

## 2015-03-22 NOTE — Progress Notes (Signed)
Pre visit review using our clinic review tool, if applicable. No additional management support is needed unless otherwise documented below in the visit note.  Had to have some dental work done.  He'll have to have a tooth pulled.  His dentist is working with cards about his blood thinner, to get that sorted out about time off med prior to the procedure.  I am not currently involved in that decision process.   He has cards f/u pending.  He feels some better on lower dose of BP med, amlodipine was cut.  Minimally lightheaded.  Able to move around some better.  Globally feels better.   DM2.  Cut out sweets and now with sugars in 140-160s.  Improved from prev.  Not on insulin yet.  D/w pt about starting if sugar stays/goes up.  Insulin teaching done today.    Vomited this AM.  He didn't feel well, ie "my stomach was rolling."  Had a large meal last night.  No blood in vomit.  Feels better now.  No fevers.  No other episodes.  No blood in stool.  This seems to have been a resolved and isolated event.  I checked with Dr. Lilyan Punt about his anemia- If he has overt bleeding, ongoing occult GI bleeding or iron deficiency that will not correct with appropriate iron replacement it would be reasonable to consider capsule endoscopy for further work up. At this point, his iron is repleted. He may have anemia from CKD, etc. D/w pt at [place].   Meds, vitals, and allergies reviewed.   ROS: See HPI.  Otherwise, noncontributory.  GEN: nad, alert and oriented HEENT: mucous membranes moist NECK: supple w/o LA CV: rrr. PULM: ctab, no inc wob ABD: soft, +bs, not ttp EXT: no edema

## 2015-03-22 NOTE — Patient Instructions (Signed)
If your fasting AM sugar is above 150, then start with 5 units of insulin at night.  If sugar stays above 150, then add 1 unit per day.  If 90-150, then give the same dose you did the day prior.  If AM sugar is below 90, then cut back one dose.  Update me as needed.  Plan on recheck A1c in about 3 months before a visit.  Take care. Glad to see you.

## 2015-03-23 NOTE — Assessment & Plan Note (Signed)
Se above

## 2015-03-23 NOTE — Assessment & Plan Note (Signed)
>  25 minutes spent in face to face time with patient, >50% spent in counselling or coordination of care.  Insulin teaching done with his pen.   He'll check sugars- noted improvement with diet changes.  D/w pt.  He'll still likely need insulin at some point.   If sugars consistently >150, then start 5 units and 1 unit per day until AM sugar 90-150.  If <90, then cut back 1 unit.  He agrees.   Okay for outpatient f/u.  Will await cards notes in meantime.  Will recheck A1c in about 3 months.  Handout given re: insulin use and d/w pt about pen use in detail- patient understands and is able to do all of the actions needed for safe insulin pen use.

## 2015-03-23 NOTE — Assessment & Plan Note (Signed)
He feels better on the lower dose of amlodipine and I'll leave it as is for now, with cards f/u pending.  He agrees.

## 2015-03-27 ENCOUNTER — Encounter: Payer: Self-pay | Admitting: Physician Assistant

## 2015-03-27 ENCOUNTER — Ambulatory Visit (INDEPENDENT_AMBULATORY_CARE_PROVIDER_SITE_OTHER): Payer: Medicare Other | Admitting: Physician Assistant

## 2015-03-27 VITALS — BP 140/60 | HR 65 | Ht 69.0 in | Wt 199.0 lb

## 2015-03-27 DIAGNOSIS — I959 Hypotension, unspecified: Secondary | ICD-10-CM | POA: Diagnosis not present

## 2015-03-27 DIAGNOSIS — I480 Paroxysmal atrial fibrillation: Secondary | ICD-10-CM

## 2015-03-27 DIAGNOSIS — R079 Chest pain, unspecified: Secondary | ICD-10-CM | POA: Diagnosis not present

## 2015-03-27 DIAGNOSIS — I10 Essential (primary) hypertension: Secondary | ICD-10-CM

## 2015-03-27 DIAGNOSIS — I2510 Atherosclerotic heart disease of native coronary artery without angina pectoris: Secondary | ICD-10-CM | POA: Diagnosis not present

## 2015-03-27 DIAGNOSIS — R5383 Other fatigue: Secondary | ICD-10-CM

## 2015-03-27 DIAGNOSIS — D649 Anemia, unspecified: Secondary | ICD-10-CM

## 2015-03-27 DIAGNOSIS — I639 Cerebral infarction, unspecified: Secondary | ICD-10-CM

## 2015-03-27 NOTE — Patient Instructions (Signed)
Medication Instructions:  Your physician recommends that you continue on your current medications as directed. Please refer to the Current Medication list given to you today.  Labwork: NONE  Testing/Procedures: NONE  Follow-Up: Your physician recommends that you schedule a follow-up appointment in: February with Dr. Meda Coffee.  If you need a refill on your cardiac medications before your next appointment, please call your pharmacy.

## 2015-03-27 NOTE — Progress Notes (Signed)
Cardiology Office Note Date:  03/27/2015  Patient ID:  Bannon, Giammarco [birth year], MRN [id number] PCP:  Elsie Stain, MD  Cardiologist: Dr. Meda Coffee  Chief Complaint: f/u chest pain, low BP  History of Present Illness: GLOVER CAPANO is a 81 y.o. male with history of CAD s/p CABG 1989, CKD stage IV, HTN with history of orthostasis, DM, anemia, CHB 09/537 (complicated by acute diastolic CHF) s/p PPM, CVA, carotid disease s/p CEAs (followed by [facility]), hyperlipidemia, PAF who presents for f/u exertional fatigue and low BP.  He was recently seen by PCP reporting decreased stamina and tendency toward lower blood pressures. Dr. Damita Dunnings cut his amlodipine in half to 2.5mg  daily. Since that time the patient says he feels "100% better." Even his sons have noticed that he has had increased stamina. Dyspnea has improved tremendously and he no longer feels lethargic. He has not had any chest pain or pressure. He follows his BP at home and this AM got a reading of 130/75. Most recent labs 02/2015 showed Hgb 10.6 (9-11 in 2016), A1C of 10, Cr 2.15. Regarding anemia, PCP note states, "I checked with Dr. Lilyan Punt about his anemia - If he has overt bleeding, ongoing occult GI bleeding or iron deficiency that will not correct with appropriate iron replacement it would be reasonable to consider capsule endoscopy for further work up. At this point, his iron is repleted. He may have anemia from CKD, etc." The patient denies any signs or symptoms of GIB. No LEE, palpitations, syncope. Renal function is followed by Dr. Candiss Norse in [place]. Last echo 07/2014 showed mild LVH, EF 76-73%, normal diastolic function, mild MR.     Past Medical History  Diagnosis Date  . Diabetes mellitus   . GERD (gastroesophageal reflux disease)   . Hyperlipidemia   . Essential hypertension   . Arthritis   . Diverticulosis   . Hemorrhoids   . Colon polyps   . CAD (coronary artery disease)     a. CABG 1989, b. Myoview low risk 2012.  [name]  CHB (complete heart block) [facility]) March 2016    a. s/p STJ dual chamber pacemaker 07/2014.  . Osteoporosis   . Chronic kidney disease, stage IV (severe) (HCC)     stage III/IV as of 2015  . PAF (paroxysmal atrial fibrillation) [facility]) March 2016  . Chronic diastolic CHF (congestive heart failure) ([place])     a. Dx 08/1935 - acute diastolic CHF in the setting of CHB.  . Carotid artery occlusion     a. Duplex 10/2014: patent R/L CEA with mild hyperplasia in right surgical bulb - followed by vascular.  . Myocardial infarction [facility]) 1976  and Mar. 19, 2016  . CVA (cerebral infarction)   . CKD (chronic kidney disease), stage IV ([place])   . Orthostasis   . Anemia     Past Surgical History  Procedure Laterality Date  . Coronary artery bypass graft    . Cholecystectomy    . Total knee arthroplasty      bilateral  . Tonsillectomy    . Carotid endarterectomy  12/31/10    Right  . Carotid endarterectomy  02/09/11    left  . Joint replacement      bilat. knees  . Cardiac catheterization  08/11/2014    Procedure: TEMPORARY PACEMAKER;  Surgeon: [name], MD;  Location: [facility] C[facility] LAB;  Service: Cardiovascular;;  . Permanent pacemaker insertion N/A 08/13/2014    STJ dual chamber pacemaker implanted by Dr  Lovena Le for CHB    Current Outpatient Prescriptions  Medication Sig Dispense Refill  . amLODipine (NORVASC) 5 MG tablet Take 0.5 tablets (2.5 mg total) by mouth daily.    [name] apixaban (ELIQUIS) 2.5 MG TABS tablet Take 1 tablet (2.5 mg total) by mouth 2 (two) times daily. 90 tablet 3  . benazepril (LOTENSIN) 20 MG tablet Take 1 tablet (20 mg total) by mouth daily. 90 tablet 3  . diltiazem (CARDIZEM CD) 360 MG 24 hr capsule Take 1 capsule (360 mg total) by mouth daily. 90 capsule 1  . furosemide (LASIX) 40 MG tablet Take 1 tablet (40 mg total) by mouth daily as needed. (Patient taking differently: Take 40 mg by mouth daily as needed (swelling). ) 30 tablet 3  . glipiZIDE (GLUCOTROL) 5 MG tablet Take 2  tablets (10 mg total) by mouth daily before breakfast. And 1 tab before supper 270 tablet 1  . Insulin Pen Needle 31G X 5 MM MISC Use daily with insulin pen 100 each 3  . lubiprostone (AMITIZA) 8 MCG capsule TAKE 1 CAPSULE BY MOUTH  DAILY WITH  BREAKFAST 90 capsule 3  . NON FORMULARY daily. careconcepts lancets an d glucometer strips    . omeprazole (PRILOSEC) 40 MG capsule Take 1 capsule (40 mg total) by mouth daily. 30 capsule 11  . rosuvastatin (CRESTOR) 20 MG tablet Take 1 tablet (20 mg total) by mouth daily. 90 tablet 3  . sitaGLIPtin (JANUVIA) 50 MG tablet Take 1 tablet (50 mg total) by mouth daily. 90 tablet 3  . Insulin Glargine (LANTUS SOLOSTAR) 100 UNIT/ML Solostar Pen Inject 5-10 Units into the skin daily at 10 pm. (Patient not taking: Reported on 03/22/2015) 5 pen PRN   No current facility-administered medications for this visit.    Allergies:   Reclast   Social History:  The patient  reports that he quit smoking about 42 years ago. His smoking use included Cigarettes. He has a 25 pack-year smoking history. He has never used smokeless tobacco. He reports that he drinks alcohol. He reports that he does not use illicit drugs.   Family History:  The patient's family history includes Cancer in an other family member; Diabetes in his mother, son, son, and another family member; Heart attack in his brother, father, and mother; Heart disease in his brother, father, and mother; Hypertension in his brother, father, son, and son; Varicose Veins in his mother. There is no history of Colon cancer.  ROS:  Please see the history of present illness.   All other systems are reviewed and otherwise negative.   PHYSICAL EXAM:  VS:  BP 140/60 mmHg  Pulse 65  Ht 5\' 9"  (1.753 m)  Wt 199 lb (90.266 kg)  BMI 29.37 kg/m2 BMI: Body mass index is 29.37 kg/(m^2). Well nourished, well developed WM, in no acute distress - lively, acts younger than stated age 10: normocephalic, atraumatic Neck: no JVD,  +right carotid bruit, no masses Cardiac:  normal S1, S2; RRR; no murmurs, rubs, or gallops Lungs:  clear to auscultation bilaterally, no wheezing, rhonchi or rales Abd: soft, nontender, no hepatomegaly, + BS MS: no deformity or atrophy Ext: no edema Skin: warm and dry, no rash Neuro:  moves all extremities spontaneously, no focal abnormalities noted, follows commands Psych: euthymic mood, full affect   EKG:  Done today shows NSR 65bpm, LBBB, left axis deviation, 1st degree AVB - unchanged from earlier this year.  Recent Labs: 08/11/2014: B Natriuretic Peptide 684.3* 08/13/2014: TSH 1.582 11/29/2014:  ALT 17 03/11/2015: BUN 33*; Creatinine, Ser 2.15*; Hemoglobin 10.6*; Platelets 153.0; Potassium 4.6; Sodium 141  No results found for requested labs within last 365 days.   Estimated Creatinine Clearance: 27.9 mL/min (by C-G formula based on Cr of 2.15).   Wt Readings from Last 3 Encounters:  03/27/15 199 lb (90.266 kg)  03/22/15 205 lb (92.987 kg)  03/15/15 205 lb 4 oz (93.101 kg)     Other studies reviewed: Additional studies/records reviewed today include: summarized above  ASSESSMENT AND PLAN:  1. Fatigue with softer BP - the patient reports significant improvement in symptoms since amlodipine was decreased. I am actually not sure where amlodipine was started in the first place as it was not on his cardiology med list in 12/2014. He is also on diltiazem. These medicines are not typically used together since they are both CCBs, however, he is currently feeling great on present regimen. I worry that if we dropped the amlodipine completely, we would see a tendency towards higher BPs again. Since the amlodipine is at low dose and the dilitazem is not maxed out, I have elected to leave him on this regimen for now. The patient will continue to follow BPs at home and notify us of any trends too high or low. 2. CAD as above - no recent angina. Continue to follow.  3. Hypertension with  hypotension - see above. We discussed importance of dietary sodium reduction. 4. Anemia - followed by PCP. 5. Paroxysmal atrial fibrillation - maintaining NSR. Continue lower dose Eliquis. He knows to observe for any bleeding. 6. Chronic diastolic CHF - appears euvolemic. He uses Lasix only sparingly PRN edema.  Disposition: F/u with Dr. Meda Coffee in 06/2014 as previously recommended.  Current medicines are reviewed at length with the patient today.  The patient did not have any concerns regarding medicines.  Raechel Ache PA-C 03/27/2015 2:03 PM     [address] [place] [address] [phone] (office)  [phone] (fax)

## 2015-04-03 ENCOUNTER — Telehealth: Payer: Self-pay | Admitting: Cardiology

## 2015-04-03 NOTE — Telephone Encounter (Signed)
New message      What dental office are you calling from? Dr Eula Listen What is your office phone and fax number? [phone]  fax What type of procedure is the patient having performed?  extraction 1. What date is procedure scheduled?  Not scheduled---but pt has infection and is in pain---need extraction ASAP  2. What is your question (ex. Antibiotics prior to procedure, holding medication-we need to know how long dentist wants pt to hold med)?  Can pt hold eliquis prior to extraction? If yes, how many days prior?

## 2015-04-03 NOTE — Telephone Encounter (Signed)
Will route this message to Dr Meda Coffee to review and advise on and follow-up with Dr Arcola Jansky office thereafter.

## 2015-04-04 NOTE — Telephone Encounter (Signed)
Will fax this message per Dr Meda Coffee to the pts Dental Office, Dr Kenton Kingfisher Daniel's office at [phone], as requested.  Spoke with Dr. Kenton Kingfisher Daniel's office to inform them of fax en route.

## 2015-04-04 NOTE — Telephone Encounter (Signed)
Please call the dental office and tell them that they can proceed with tooth extraction or any other dental work, there is no contraindication from cardiac standpoint. He should hold his ELiquis the night befre and the morning of the procedure. He can restart it As soon as ok from a dentist standpoint.

## 2015-04-23 ENCOUNTER — Encounter: Payer: Self-pay | Admitting: Family Medicine

## 2015-05-14 ENCOUNTER — Encounter: Payer: Self-pay | Admitting: Family Medicine

## 2015-05-14 ENCOUNTER — Ambulatory Visit (INDEPENDENT_AMBULATORY_CARE_PROVIDER_SITE_OTHER): Payer: Medicare Other | Admitting: Family Medicine

## 2015-05-14 VITALS — BP 130/58 | HR 75 | Temp 97.9°F | Wt 203.0 lb

## 2015-05-14 DIAGNOSIS — I639 Cerebral infarction, unspecified: Secondary | ICD-10-CM | POA: Diagnosis not present

## 2015-05-14 DIAGNOSIS — J209 Acute bronchitis, unspecified: Secondary | ICD-10-CM

## 2015-05-14 MED ORDER — GUAIFENESIN-CODEINE 100-10 MG/5ML PO SYRP: 5.0000 mL | ORAL_SOLUTION | Freq: Three times a day (TID) | ORAL | Status: DC | PRN

## 2015-05-14 MED ORDER — AZITHROMYCIN 250 MG PO TABS: ORAL_TABLET | ORAL | Status: DC

## 2015-05-14 NOTE — Assessment & Plan Note (Signed)
Given age and comorbidities treat acute bronchitis aggressively with zpack. Provided with cheratussin for night time cough. Pt states has tolerated both azithromycin and codeine well in the past.  Further supportive care as per instructions. Update if not improving as expected over next 4-5 days , sooner if any worsening.

## 2015-05-14 NOTE — Patient Instructions (Addendum)
I do think you have bronchitis and pharyngitis - treat with zpack antibiotic.  Push fluids and rest. Honey with lemon can soothe the throat.  May continue mucinex with big glass of water to help mobilize mucous.  Let us know if fever >101, worsening productive cough, or just not improving over next 4-5 days with treatment.

## 2015-05-14 NOTE — Progress Notes (Signed)
BP 130/58 mmHg  Pulse 75  Temp(Src) 97.9 F (36.6 C) (Oral)  Wt 203 lb (92.08 kg)  SpO2 97%   CC: cough  Subjective:    Patient ID: Dean Murphy, male    DOB: [birth year], 81 y.o.   MRN: [id number]  HPI: Dean Murphy is a 81 y.o. male presenting on 05/14/2015 for Cough   7d h/o productive cough of green mucous, sore throat with swollen glands, mild frontal sinus pressure headache. Chest tightness present. Cough waking him up at night time. Overnight felt bad.   No fevers/chills, ear or tooth pain, abd pain, dyspnea or wheezing. No body aches  Has been using vicks vaporub, heating pad. Also using mucinex.  Wife sick as well.  No smokers at home.   No h/o asthma or COPD.   Diabetic on insulin and afib on eliquis with pacer after complete heart block Lab Results  Component Value Date   HGBA1C 10.0* 03/11/2015     Relevant past medical, surgical, family and social history reviewed and updated as indicated. Interim medical history since our last visit reviewed. Allergies and medications reviewed and updated. Current Outpatient Prescriptions on File Prior to Visit  Medication Sig  . amLODipine (NORVASC) 5 MG tablet Take 0.5 tablets (2.5 mg total) by mouth daily.  [name] apixaban (ELIQUIS) 2.5 MG TABS tablet Take 1 tablet (2.5 mg total) by mouth 2 (two) times daily.  . benazepril (LOTENSIN) 20 MG tablet Take 1 tablet (20 mg total) by mouth daily.  [name] diltiazem (CARDIZEM CD) 360 MG 24 hr capsule Take 1 capsule (360 mg total) by mouth daily.  [name] glipiZIDE (GLUCOTROL) 5 MG tablet Take 2 tablets (10 mg total) by mouth daily before breakfast. And 1 tab before supper  . Insulin Glargine (LANTUS SOLOSTAR) 100 UNIT/ML Solostar Pen Inject 5-10 Units into the skin daily at 10 pm.  . Insulin Pen Needle 31G X 5 MM MISC Use daily with insulin pen  . lubiprostone (AMITIZA) 8 MCG capsule TAKE 1 CAPSULE BY MOUTH  DAILY WITH  BREAKFAST  . NON FORMULARY daily. careconcepts lancets an d glucometer strips   . omeprazole (PRILOSEC) 40 MG capsule Take 1 capsule (40 mg total) by mouth daily.  . rosuvastatin (CRESTOR) 20 MG tablet Take 1 tablet (20 mg total) by mouth daily.  . sitaGLIPtin (JANUVIA) 50 MG tablet Take 1 tablet (50 mg total) by mouth daily.  . furosemide (LASIX) 40 MG tablet Take 1 tablet (40 mg total) by mouth daily as needed. (Patient taking differently: Take 40 mg by mouth daily as needed (swelling). )   No current facility-administered medications on file prior to visit.    Review of Systems Per HPI unless specifically indicated in ROS section     Objective:    BP 130/58 mmHg  Pulse 75  Temp(Src) 97.9 F (36.6 C) (Oral)  Wt 203 lb (92.08 kg)  SpO2 97%  Wt Readings from Last 3 Encounters:  05/14/15 203 lb (92.08 kg)  03/27/15 199 lb (90.266 kg)  03/22/15 205 lb (92.987 kg)    Physical Exam  Constitutional: He appears well-developed and well-nourished. No distress.  HENT:  Head: Normocephalic and atraumatic.  Right Ear: Hearing, tympanic membrane, external ear and ear canal normal.  Left Ear: Hearing, tympanic membrane, external ear and ear canal normal.  Nose: Mucosal edema present. No rhinorrhea. Right sinus exhibits no maxillary sinus tenderness and no frontal sinus tenderness. Left sinus exhibits no maxillary sinus tenderness and no frontal sinus  tenderness.  Mouth/Throat: Uvula is midline and mucous membranes are normal. Posterior oropharyngeal edema and posterior oropharyngeal erythema present. No oropharyngeal exudate or tonsillar abscesses.  Oropharyngeal cobblestoning  Eyes: Conjunctivae and EOM are normal. Pupils are equal, round, and reactive to light. No scleral icterus.  Neck: Normal range of motion. Neck supple.  Cardiovascular: Normal rate, regular rhythm and intact distal pulses.   Murmur (mild systolic) heard. Pulmonary/Chest: Effort normal and breath sounds normal. No respiratory distress. He has no decreased breath sounds. He has no wheezes. He has  no rhonchi. He has no rales.  Bibasilar crackles  Lymphadenopathy:    He has no cervical adenopathy.  Skin: Skin is warm and dry. No rash noted.  Nursing note and vitals reviewed.  Results for orders placed or performed in visit on 04/23/15  HM DIABETES EYE EXAM  Result Value Ref Range   HM Diabetic Eye Exam No Retinopathy No Retinopathy      Assessment & Plan:   Problem List Items Addressed This Visit    Acute bronchitis - Primary    Given age and comorbidities treat acute bronchitis aggressively with zpack. Provided with cheratussin for night time cough. Pt states has tolerated both azithromycin and codeine well in the past.  Further supportive care as per instructions. Update if not improving as expected over next 4-5 days , sooner if any worsening.          Follow up plan: Return if symptoms worsen or fail to improve.

## 2015-05-21 ENCOUNTER — Ambulatory Visit (INDEPENDENT_AMBULATORY_CARE_PROVIDER_SITE_OTHER): Payer: Medicare Other | Admitting: *Deleted

## 2015-05-21 DIAGNOSIS — I442 Atrioventricular block, complete: Secondary | ICD-10-CM

## 2015-05-21 NOTE — Progress Notes (Signed)
Remote pacemaker transmission.   

## 2015-05-23 ENCOUNTER — Encounter: Payer: Self-pay | Admitting: *Deleted

## 2015-06-07 LAB — CUP PACEART REMOTE DEVICE CHECK
Battery Remaining Longevity: 115 mo
Brady Statistic AP VS Percent: 1 %
Brady Statistic RV Percent Paced: 99 %
Date Time Interrogation Session: 20161227070013
Implantable Lead Implant Date: 20160321
Lead Channel Impedance Value: 410 Ohm
Lead Channel Pacing Threshold Amplitude: 0.5 V
Lead Channel Setting Pacing Pulse Width: 0.5 ms
MDC IDC LEAD IMPLANT DT: 20160321
MDC IDC LEAD LOCATION: 753859
MDC IDC LEAD LOCATION: 753860
MDC IDC MSMT BATTERY REMAINING PERCENTAGE: 95.5 %
MDC IDC MSMT BATTERY VOLTAGE: 3.01 V
MDC IDC MSMT LEADCHNL RA PACING THRESHOLD AMPLITUDE: 0.75 V
MDC IDC MSMT LEADCHNL RA PACING THRESHOLD PULSEWIDTH: 0.5 ms
MDC IDC MSMT LEADCHNL RA SENSING INTR AMPL: 2.4 mV
MDC IDC MSMT LEADCHNL RV IMPEDANCE VALUE: 390 Ohm
MDC IDC MSMT LEADCHNL RV PACING THRESHOLD PULSEWIDTH: 0.5 ms
MDC IDC MSMT LEADCHNL RV SENSING INTR AMPL: 7.7 mV
MDC IDC SET LEADCHNL RA PACING AMPLITUDE: 2 V
MDC IDC SET LEADCHNL RV PACING AMPLITUDE: 0.75 V
MDC IDC SET LEADCHNL RV SENSING SENSITIVITY: 4 mV
MDC IDC STAT BRADY AP VP PERCENT: 37 %
MDC IDC STAT BRADY AS VP PERCENT: 63 %
MDC IDC STAT BRADY AS VS PERCENT: 1 %
MDC IDC STAT BRADY RA PERCENT PACED: 36 %
Pulse Gen Model: 2240
Pulse Gen Serial Number: 7734710

## 2015-06-12 ENCOUNTER — Encounter: Payer: Self-pay | Admitting: Cardiology

## 2015-06-16 ENCOUNTER — Other Ambulatory Visit: Payer: Self-pay | Admitting: Family Medicine

## 2015-06-16 DIAGNOSIS — D649 Anemia, unspecified: Secondary | ICD-10-CM

## 2015-06-16 DIAGNOSIS — E1122 Type 2 diabetes mellitus with diabetic chronic kidney disease: Secondary | ICD-10-CM

## 2015-06-20 ENCOUNTER — Other Ambulatory Visit (INDEPENDENT_AMBULATORY_CARE_PROVIDER_SITE_OTHER): Payer: 59

## 2015-06-20 DIAGNOSIS — D649 Anemia, unspecified: Secondary | ICD-10-CM

## 2015-06-20 DIAGNOSIS — E1122 Type 2 diabetes mellitus with diabetic chronic kidney disease: Secondary | ICD-10-CM

## 2015-06-20 LAB — BASIC METABOLIC PANEL
BUN: 31 mg/dL — AB (ref 6–23)
CHLORIDE: 109 meq/L (ref 96–112)
CO2: 24 mEq/L (ref 19–32)
Calcium: 9.1 mg/dL (ref 8.4–10.5)
Creatinine, Ser: 2.05 mg/dL — ABNORMAL HIGH (ref 0.40–1.50)
GFR: 32.87 mL/min — AB (ref >60.00)
GLUCOSE: 102 mg/dL — AB (ref 70–99)
POTASSIUM: 4.9 meq/L (ref 3.5–5.1)
SODIUM: 141 meq/L (ref 135–145)

## 2015-06-20 LAB — CBC WITH DIFFERENTIAL/PLATELET
BASOS PCT: 0.8 % (ref 0.0–3.0)
Basophils Absolute: 0.1 10*3/uL (ref 0.0–0.1)
EOS ABS: 0.2 10*3/uL (ref 0.0–0.7)
EOS PCT: 2.6 % (ref 0.0–5.0)
HEMATOCRIT: 30.6 % — AB (ref 39.0–52.0)
HEMOGLOBIN: 9.9 g/dL — AB (ref 13.0–17.0)
LYMPHS PCT: 28.1 % (ref 12.0–46.0)
Lymphs Abs: 2.1 10*3/uL (ref 0.7–4.0)
MCHC: 32.3 g/dL (ref 30.0–36.0)
MCV: 79.3 fl (ref 78.0–100.0)
MONOS PCT: 9 % (ref 3.0–12.0)
Monocytes Absolute: 0.7 10*3/uL (ref 0.1–1.0)
NEUTROS ABS: 4.5 10*3/uL (ref 1.4–7.7)
Neutrophils Relative %: 59.5 % (ref 43.0–77.0)
PLATELETS: 166 10*3/uL (ref 150.0–400.0)
RBC: 3.86 Mil/uL — ABNORMAL LOW (ref 4.22–5.81)
RDW: 17.3 % — AB (ref 11.5–15.5)
WBC: 7.6 10*3/uL (ref 4.0–10.5)

## 2015-06-20 LAB — IBC PANEL
Iron: 43 ug/dL (ref 42–165)
Saturation Ratios: 9.3 % — ABNORMAL LOW (ref 20.0–50.0)
TRANSFERRIN: 330 mg/dL (ref 212.0–360.0)

## 2015-06-20 LAB — HEMOGLOBIN A1C: Hgb A1c MFr Bld: 7.4 % — ABNORMAL HIGH (ref 4.6–6.5)

## 2015-06-24 ENCOUNTER — Encounter: Payer: Self-pay | Admitting: Family Medicine

## 2015-06-24 ENCOUNTER — Ambulatory Visit (INDEPENDENT_AMBULATORY_CARE_PROVIDER_SITE_OTHER): Payer: 59 | Admitting: Family Medicine

## 2015-06-24 VITALS — BP 122/58 | HR 67 | Temp 97.6°F | Wt 205.0 lb

## 2015-06-24 DIAGNOSIS — Z23 Encounter for immunization: Secondary | ICD-10-CM

## 2015-06-24 DIAGNOSIS — E1122 Type 2 diabetes mellitus with diabetic chronic kidney disease: Secondary | ICD-10-CM | POA: Diagnosis not present

## 2015-06-24 DIAGNOSIS — D649 Anemia, unspecified: Secondary | ICD-10-CM

## 2015-06-24 DIAGNOSIS — D509 Iron deficiency anemia, unspecified: Secondary | ICD-10-CM | POA: Diagnosis not present

## 2015-06-24 DIAGNOSIS — E119 Type 2 diabetes mellitus without complications: Secondary | ICD-10-CM

## 2015-06-24 MED ORDER — FERROUS SULFATE 325 (65 FE) MG PO TABS: 325.0000 mg | ORAL_TABLET | Freq: Two times a day (BID) | ORAL | Status: DC

## 2015-06-24 NOTE — Patient Instructions (Signed)
Recheck labs before a visit in about 3 months.   Restart iron now.  Take it twice a day.  It can make you constipated.  It will make your stools black.  If you notice blood in your stool then update me.  If you have persistently high sugars- 160-180s, then add back the insulin, 5 units per day if needed.  Take care.  Glad to see you.

## 2015-06-24 NOTE — Progress Notes (Signed)
Pre visit review using our clinic review tool, if applicable. No additional management support is needed unless otherwise documented below in the visit note.  Diabetes:  Using medications without difficulties: rare use of insulin with better diet adherence.   Hypoglycemic episodes:no Hyperglycemic episodes:no Feet problems:no Blood Sugars averaging: usually 80-140s now  Anemia.  Off iron and lower counts noted, labs d/w pt.  No blood in stool.  Feeling well overall.    PMH and SH reviewed  Meds, vitals, and allergies reviewed.   ROS: See HPI.  Otherwise negative.    GEN: nad, alert and oriented HEENT: mucous membranes moist NECK: supple w/o LA CV: rrr. PULM: ctab, no inc wob ABD: soft, +bs EXT: no edema SKIN: no acute rash  Diabetic foot exam: Normal inspection No skin breakdown No calluses  Normal DP pulses Normal sensation to light touch and monofilament Nails normal

## 2015-06-25 NOTE — Assessment & Plan Note (Signed)
Much improved, will continue off insulin if sugar is controlled, can restart at 5 units if sugar persistently elevated.  A1c likely improved from sugar being lower and artificially lower from anemia.  D/w pt.  Recheck in about 3 months, sooner if needed. >25 minutes spent in face to face time with patient, >50% spent in counselling or coordination of care .

## 2015-06-25 NOTE — Assessment & Plan Note (Signed)
Restart iron and recheck labs in about 3 months.  He agrees.  Okay for outpatient f/u.

## 2015-07-08 ENCOUNTER — Encounter: Payer: Self-pay | Admitting: Cardiology

## 2015-07-08 ENCOUNTER — Ambulatory Visit (INDEPENDENT_AMBULATORY_CARE_PROVIDER_SITE_OTHER): Payer: Medicare Other | Admitting: Cardiology

## 2015-07-08 VITALS — BP 126/64 | HR 72 | Ht 69.0 in | Wt 205.0 lb

## 2015-07-08 DIAGNOSIS — I950 Idiopathic hypotension: Secondary | ICD-10-CM

## 2015-07-08 DIAGNOSIS — I10 Essential (primary) hypertension: Secondary | ICD-10-CM | POA: Diagnosis not present

## 2015-07-08 DIAGNOSIS — I779 Disorder of arteries and arterioles, unspecified: Secondary | ICD-10-CM

## 2015-07-08 DIAGNOSIS — I5032 Chronic diastolic (congestive) heart failure: Secondary | ICD-10-CM

## 2015-07-08 DIAGNOSIS — I739 Peripheral vascular disease, unspecified: Secondary | ICD-10-CM

## 2015-07-08 MED ORDER — DILTIAZEM HCL ER COATED BEADS 360 MG PO CP24: 360.0000 mg | ORAL_CAPSULE | Freq: Every day | ORAL | Status: DC

## 2015-07-08 NOTE — Progress Notes (Signed)
Patient ID: Dean Murphy, male   DOB: [birth year], 82 y.o.   MRN: [id number]     Cardiology Office Note Date:  07/08/2015  Patient ID:  Dean Murphy, Dean Murphy [birth year], MRN [id number] PCP:  Elsie Stain, MD  Cardiologist: Dr. Meda Coffee  Chief Complaint: f/u chest pain, low BP  History of Present Illness: Dean Murphy is a 82 y.o. male with history of CAD s/p CABG 1989, CKD stage IV, HTN with history of orthostasis, DM, anemia, CHB [id number] (complicated by acute diastolic CHF) s/p PPM, CVA, carotid disease s/p CEAs (followed by [facility]), hyperlipidemia, PAF who presents for f/u exertional fatigue and low BP. He was seen by Melina Copa in 02/2015 for decreased stamina and tendency toward lower blood pressures. Dr. Damita Dunnings cut his amlodipine in half to 2.5mg  daily. Since that time the patient says he feels "100% better." Even his sons have noticed that he has had increased stamina. Dyspnea has improved tremendously and he no longer feels lethargic. He has not had any chest pain or pressure. He follows his BP at home and this AM got a reading of 130/75.  Today he denies chest pain or SOB, he can walk minimally, mostly limited by back/leg pain. No orthopnea, PND. He complains of LE edema that is on and off and chronic. Last echo 07/2014 showed mild LVH, EF [id number], normal diastolic function, mild MR. No syncope, the last PM check in 04/2015 with normal function.  Past Medical History  Diagnosis Date  . Diabetes mellitus   . GERD (gastroesophageal reflux disease)   . Hyperlipidemia   . Essential hypertension   . Arthritis   . Diverticulosis   . Hemorrhoids   . Colon polyps   . CAD (coronary artery disease)     a. CABG 1989, b. Myoview low risk 2012.  [name] CHB (complete heart block) [facility]) March 2016    a. s/p STJ dual chamber pacemaker 07/2014.  . Osteoporosis   . Chronic kidney disease, stage IV (severe) (HCC)     stage III/IV as of 2015  . PAF (paroxysmal atrial fibrillation) [facility]) March 2016  . Chronic  diastolic CHF (congestive heart failure) ([place])     a. Dx [id number] - acute diastolic CHF in the setting of CHB.  . Carotid artery occlusion     a. Duplex 10/2014: patent R/L CEA with mild hyperplasia in right surgical bulb - followed by vascular.  . Myocardial infarction [facility]) 1976  and Mar. 19, 2016  . CVA (cerebral infarction)   . CKD (chronic kidney disease), stage IV ([place])   . Orthostasis   . Anemia     Past Surgical History  Procedure Laterality Date  . Coronary artery bypass graft    . Cholecystectomy    . Total knee arthroplasty      bilateral  . Tonsillectomy    . Carotid endarterectomy  12/31/10    Right  . Carotid endarterectomy  02/09/11    left  . Joint replacement      bilat. knees  . Cardiac catheterization  08/11/2014    Procedure: TEMPORARY PACEMAKER;  Surgeon: [name], MD;  Location: [facility] CATH LAB;  Service: Cardiovascular;;  . Permanent pacemaker insertion N/A 08/13/2014    STJ dual chamber pacemaker implanted by [name] for CHB    Current Outpatient Prescriptions  Medication Sig Dispense Refill  . amLODipine (NORVASC) 5 MG tablet Take 0.5 tablets (2.5 mg total) by mouth daily.    [name] apixaban (ELIQUIS) 2.5 MG  TABS tablet Take 1 tablet (2.5 mg total) by mouth 2 (two) times daily. 90 tablet 3  . benazepril (LOTENSIN) 20 MG tablet Take 1 tablet (20 mg total) by mouth daily. 90 tablet 3  . diltiazem (CARDIZEM CD) 360 MG 24 hr capsule Take 1 capsule (360 mg total) by mouth daily. 90 capsule 1  . ferrous sulfate ([employer] IRON) 325 (65 FE) MG tablet Take 1 tablet (325 mg total) by mouth 2 (two) times daily with a meal. 180 tablet 3  . glipiZIDE (GLUCOTROL) 5 MG tablet Take 2 tablets (10 mg total) by mouth daily before breakfast. And 1 tab before supper 270 tablet 1  . Insulin Glargine (LANTUS SOLOSTAR) 100 UNIT/ML Solostar Pen Inject 5-10 Units into the skin daily at 10 pm. 5 pen PRN  . Insulin Pen Needle 31G X 5 MM MISC Use daily with insulin pen 100 each 3  .  lubiprostone (AMITIZA) 8 MCG capsule TAKE 1 CAPSULE BY MOUTH  DAILY WITH  BREAKFAST 90 capsule 3  . NON FORMULARY daily. careconcepts lancets an d glucometer strips    . omeprazole (PRILOSEC) 40 MG capsule Take 1 capsule (40 mg total) by mouth daily. 30 capsule 11  . rosuvastatin (CRESTOR) 20 MG tablet Take 1 tablet (20 mg total) by mouth daily. 90 tablet 3  . sitaGLIPtin (JANUVIA) 50 MG tablet Take 1 tablet (50 mg total) by mouth daily. 90 tablet 3  . furosemide (LASIX) 40 MG tablet Take 1 tablet (40 mg total) by mouth daily as needed. (Patient taking differently: Take 40 mg by mouth daily as needed (swelling). ) 30 tablet 3   No current facility-administered medications for this visit.    Allergies:   Reclast   Social History:  The patient  reports that he quit smoking about 42 years ago. His smoking use included Cigarettes. He has a 25 pack-year smoking history. He has never used smokeless tobacco. He reports that he drinks alcohol. He reports that he does not use illicit drugs.   Family History:  The patient's family history includes Diabetes in his mother, son, and son; Heart attack in his brother, father, and mother; Heart disease in his brother, father, and mother; Hypertension in his brother, father, son, and son; Varicose Veins in his mother. There is no history of Colon cancer.  ROS:  Please see the history of present illness.   All other systems are reviewed and otherwise negative.   PHYSICAL EXAM:  VS:  BP 126/64 mmHg  Pulse 72  Ht 5\' 9"  (1.753 m)  Wt 205 lb (92.987 kg)  BMI 30.26 kg/m2 BMI: Body mass index is 30.26 kg/(m^2). Well nourished, well developed WM, in no acute distress - lively, acts younger than stated age 82: normocephalic, atraumatic Neck: no JVD, +right carotid bruit, no masses Cardiac:  normal S1, S2; RRR; no murmurs, rubs, or gallops Lungs:  clear to auscultation bilaterally, no wheezing, rhonchi or rales Abd: soft, nontender, no hepatomegaly, + BS MS:  no deformity or atrophy Ext: no edema Skin: warm and dry, no rash Neuro:  moves all extremities spontaneously, no focal abnormalities noted, follows commands Psych: euthymic mood, full affect   EKG:  Done today shows NSR 65bpm, LBBB, left axis deviation, 1st degree AVB - unchanged from earlier this year.  Recent Labs: 08/11/2014: B Natriuretic Peptide 684.3* 08/13/2014: TSH 1.582 11/29/2014: ALT 17 06/20/2015: BUN 31*; Creatinine, Ser 2.05*; Hemoglobin 9.9*; Platelets 166.0; Potassium 4.9; Sodium 141  No results found for requested labs within  last 365 days.   Estimated Creatinine Clearance: 29.1 mL/min (by C-G formula based on Cr of 2.05).   Wt Readings from Last 3 Encounters:  07/08/15 205 lb (92.987 kg)  06/24/15 205 lb (92.987 kg)  05/14/15 203 lb (92.08 kg)     Other studies reviewed: Additional studies/records reviewed today include: summarized above  ASSESSMENT AND PLAN:  1. Fatigue with softer BP - I will discontinue amlodipine as his BP still low for his age and he has LE edema. 2. CAD as above - no recent angina. Continue to follow.  3. Hypertension with hypotension - as above. 4. Anemia - followed by PCP. 5. Paroxysmal atrial fibrillation - maintaining NSR. Continue lower dose Eliquis. No bleeding, stable Hb in 05/2015 at 9.9 g/dL. 6. Chronic diastolic CHF - appears euvolemic. He uses Lasix only sparingly PRN edema. 7. Carotid disease - s/p B/L endarterectomy in 2012, stable carotid US in 10/2014, followed by Dr Oneida Alar.  Follow up in 6 months.  Corliss Blacker, MD  07/08/2015 8:28 AM     [address] [place]  [id number] [phone] (office)  [phone] (fax)

## 2015-07-08 NOTE — Patient Instructions (Signed)
Your physician has recommended you make the following change in your medication:  1.) stop amlodipine  Your physician wants you to follow-up in: 6 months with Dr. Meda Coffee.  You will receive a reminder letter in the mail two months in advance. If you don't receive a letter, please call our office to schedule the follow-up appointment.

## 2015-07-09 ENCOUNTER — Telehealth: Payer: Self-pay | Admitting: Cardiology

## 2015-07-09 MED ORDER — DILTIAZEM HCL ER BEADS 360 MG PO CP24: 360.0000 mg | ORAL_CAPSULE | Freq: Every day | ORAL | Status: DC

## 2015-07-09 NOTE — Telephone Encounter (Signed)
Spoke with Dr Meda Coffee and its ok to switch the pts medication to Tiazac 360 mg po daily, and d/c the Cardizem CD 360 mg, for they are both the same drug, just the tiazac is more affordable.  Notified the pts pharmacist Amy at [facility] of this.  90 day supply provided.

## 2015-07-09 NOTE — Telephone Encounter (Signed)
Wants to know if they can use Tiazac 360 instead of Cardizem CD 360.  This is for insurance purpose.

## 2015-07-26 ENCOUNTER — Ambulatory Visit: Payer: Self-pay | Admitting: Orthopedic Surgery

## 2015-07-26 ENCOUNTER — Encounter (HOSPITAL_COMMUNITY): Payer: Self-pay

## 2015-07-26 ENCOUNTER — Encounter (HOSPITAL_COMMUNITY)
Admission: RE | Admit: 2015-07-26 | Discharge: 2015-07-26 | Disposition: A | Discharge status: Home / Self Care | Payer: Medicare Other | Source: Ambulatory Visit | Attending: Orthopedic Surgery | Admitting: Orthopedic Surgery

## 2015-07-26 HISTORY — DX: Reserved for inherently not codable concepts without codable children: IMO0001

## 2015-07-26 HISTORY — DX: Adverse effect of unspecified anesthetic, initial encounter: T41.45XA

## 2015-07-26 HISTORY — DX: Other specified postprocedural states: Z98.890

## 2015-07-26 HISTORY — DX: Palmar fascial fibromatosis (dupuytren): M72.0

## 2015-07-26 HISTORY — DX: Constipation, unspecified: K59.00

## 2015-07-26 HISTORY — DX: Presence of cardiac pacemaker: Z95.0

## 2015-07-26 HISTORY — DX: Other specified postprocedural states: R11.2

## 2015-07-26 HISTORY — DX: Other complications of anesthesia, initial encounter: T88.59XA

## 2015-07-26 LAB — GLUCOSE, CAPILLARY: GLUCOSE-CAPILLARY: 209 mg/dL — AB (ref 65–99)

## 2015-07-26 NOTE — Progress Notes (Addendum)
Dean Murphy is seen by [facility], patient denies chest pain or palpations, has shortness of breath, '"due to leg pain, I was not bothered by it until; this."  Patient reported that he had labs drawn 2 days ago as ordered by Dean Murphy, "he said that they were for surgery and I would not need any more.  I called Dean Murphy at [facility] office and she will check this out and send labs to [place]. Dean Murphy  stopped Eliquis after last dose 07/25/15, "he called my heart Dean while I was in the office to get his approval."

## 2015-07-26 NOTE — Pre-Procedure Instructions (Addendum)
Dean Murphy  07/26/2015    Your procedure is scheduled on : Monday, March 6.  Report to [facility] Admitting at 11:00 A.M.                Your surgery or procedure is scheduled for 1:00 PM   Call this number if you have problems the morning of surgery:[phone]    Remember:  Do not eat food or drink liquids after midnight Sunday, March 5.   Take these medicines the morning of surgery with A SIP OF WATER : omeprazole (PRILOSEC), rosuvastatin (CRESTOR) , diltiazem Dean Murphy).                  Dean Murphy eye drops                    Strop Eliquis as instructed by Dr Dean Murphy.                     Dean take Pain medications if needed.    How to Manage Your Diabetes Before Surgery   What do I do about my diabetes medications?   Do not take oral diabetes medicines (pills) the morning of surgery.  THE Morning of Surgery ,  If Blood Sugar is greater than 150- take : 2 units of Lantus Insulin, If Blood Sugar is greater than 180 take 5 units of Lantus Insulin   Why is it important to control my blood sugar before and after surgery?   Improving blood sugar levels before and after surgery helps healing and can limit problems.  A way of improving blood sugar control is eating a healthy diet by:  - Eating less sugar and carbohydrates  - Increasing activity/exercise  - Talk with your doctor about reaching your blood sugar goals  High blood sugars (greater than 180 mg/dL) can raise your risk of infections and slow down your recovery so you will need to focus on controlling your diabetes during the weeks before surgery.  Make sure that the doctor who takes care of your diabetes knows about your planned surgery including the date and location.  How do I manage my blood sugars before surgery?   Check your blood sugar at least 4 times a day, 2 days before surgery to make sure that they are not too high or low.   Check your blood sugar the morning of your surgery when you  wake up and every 2  hours until you get to the Short-Stay unit.  If your blood sugar is less than 70 mg/dL, you will need to treat for low blood sugar by:  Treat a low blood sugar (less than 70 mg/dL) with 1/2 cup of clear juice (cranberry or apple), 4 glucose tablets, OR glucose gel.  Recheck blood sugar in 15 minutes after treatment (to make sure it is greater than 70 mg/dL).  If blood sugar is not greater than 70 mg/dL on re-check, call [phone] for further instructions.   Report your blood sugar to the Short-Stay nurse when you get to Short-Stay.  References:  [facility], 2007 "How to Manage your Diabetes Before and After Surgery".    Do not wear jewelry, make-up or nail polish.  Do not wear lotions, powders, or perfumes.     Men Dean shave face and neck.  Do not bring valuables to the hospital.  [facility] is not responsible for any belongings or valuables.  Contacts, dentures or  bridgework Dean not be worn into surgery.  Leave your suitcase in the car.  After surgery it Dean be brought to your room.  For patients admitted to the hospital, discharge time will be determined by your treatment team.  Patients discharged the day of surgery will not be allowed to drive home.   Name and phone number of your driver:   -  Special instructions: Review  Dean Murphy - Preparing For Surgery.  Please read over the following fact sheets that you were given. Pain Booklet, Coughing and Deep Breathing and Surgical Site Infection Prevention

## 2015-07-29 ENCOUNTER — Encounter (HOSPITAL_COMMUNITY)
Admission: RE | Disposition: A | Discharge status: Home Health | Payer: Self-pay | Source: Ambulatory Visit | Attending: Orthopedic Surgery

## 2015-07-29 ENCOUNTER — Inpatient Hospital Stay (HOSPITAL_COMMUNITY): Payer: Medicare Other | Admitting: Anesthesiology

## 2015-07-29 ENCOUNTER — Encounter (HOSPITAL_COMMUNITY): Payer: Self-pay | Admitting: *Deleted

## 2015-07-29 ENCOUNTER — Inpatient Hospital Stay (HOSPITAL_COMMUNITY)
Admission: RE | Admit: 2015-07-29 | Discharge: 2015-07-31 | DRG: 486 | Disposition: A | Discharge status: Home Health | Payer: Medicare Other | Source: Ambulatory Visit | Attending: Orthopedic Surgery | Admitting: Orthopedic Surgery

## 2015-07-29 DIAGNOSIS — Z959 Presence of cardiac and vascular implant and graft, unspecified: Secondary | ICD-10-CM | POA: Diagnosis not present

## 2015-07-29 DIAGNOSIS — E1122 Type 2 diabetes mellitus with diabetic chronic kidney disease: Secondary | ICD-10-CM | POA: Diagnosis present

## 2015-07-29 DIAGNOSIS — I5032 Chronic diastolic (congestive) heart failure: Secondary | ICD-10-CM | POA: Diagnosis present

## 2015-07-29 DIAGNOSIS — T8459XA Infection and inflammatory reaction due to other internal joint prosthesis, initial encounter: Secondary | ICD-10-CM

## 2015-07-29 DIAGNOSIS — I130 Hypertensive heart and chronic kidney disease with heart failure and stage 1 through stage 4 chronic kidney disease, or unspecified chronic kidney disease: Secondary | ICD-10-CM | POA: Diagnosis present

## 2015-07-29 DIAGNOSIS — Z961 Presence of intraocular lens: Secondary | ICD-10-CM | POA: Diagnosis present

## 2015-07-29 DIAGNOSIS — Z9842 Cataract extraction status, left eye: Secondary | ICD-10-CM | POA: Diagnosis not present

## 2015-07-29 DIAGNOSIS — Y838 Other surgical procedures as the cause of abnormal reaction of the patient, or of later complication, without mention of misadventure at the time of the procedure: Secondary | ICD-10-CM | POA: Diagnosis not present

## 2015-07-29 DIAGNOSIS — Z950 Presence of cardiac pacemaker: Secondary | ICD-10-CM | POA: Diagnosis not present

## 2015-07-29 DIAGNOSIS — N189 Chronic kidney disease, unspecified: Secondary | ICD-10-CM | POA: Diagnosis not present

## 2015-07-29 DIAGNOSIS — I6529 Occlusion and stenosis of unspecified carotid artery: Secondary | ICD-10-CM | POA: Diagnosis present

## 2015-07-29 DIAGNOSIS — I442 Atrioventricular block, complete: Secondary | ICD-10-CM | POA: Diagnosis present

## 2015-07-29 DIAGNOSIS — Z87891 Personal history of nicotine dependence: Secondary | ICD-10-CM | POA: Diagnosis not present

## 2015-07-29 DIAGNOSIS — E785 Hyperlipidemia, unspecified: Secondary | ICD-10-CM | POA: Diagnosis present

## 2015-07-29 DIAGNOSIS — K219 Gastro-esophageal reflux disease without esophagitis: Secondary | ICD-10-CM | POA: Diagnosis present

## 2015-07-29 DIAGNOSIS — I2510 Atherosclerotic heart disease of native coronary artery without angina pectoris: Secondary | ICD-10-CM | POA: Diagnosis present

## 2015-07-29 DIAGNOSIS — Z79899 Other long term (current) drug therapy: Secondary | ICD-10-CM

## 2015-07-29 DIAGNOSIS — D62 Acute posthemorrhagic anemia: Secondary | ICD-10-CM | POA: Diagnosis not present

## 2015-07-29 DIAGNOSIS — B952 Enterococcus as the cause of diseases classified elsewhere: Secondary | ICD-10-CM | POA: Diagnosis not present

## 2015-07-29 DIAGNOSIS — Z9841 Cataract extraction status, right eye: Secondary | ICD-10-CM

## 2015-07-29 DIAGNOSIS — I480 Paroxysmal atrial fibrillation: Secondary | ICD-10-CM | POA: Diagnosis present

## 2015-07-29 DIAGNOSIS — Z794 Long term (current) use of insulin: Secondary | ICD-10-CM | POA: Diagnosis not present

## 2015-07-29 DIAGNOSIS — Z951 Presence of aortocoronary bypass graft: Secondary | ICD-10-CM | POA: Diagnosis not present

## 2015-07-29 DIAGNOSIS — Y831 Surgical operation with implant of artificial internal device as the cause of abnormal reaction of the patient, or of later complication, without mention of misadventure at the time of the procedure: Secondary | ICD-10-CM | POA: Diagnosis present

## 2015-07-29 DIAGNOSIS — Z8673 Personal history of transient ischemic attack (TIA), and cerebral infarction without residual deficits: Secondary | ICD-10-CM | POA: Diagnosis present

## 2015-07-29 DIAGNOSIS — Z96659 Presence of unspecified artificial knee joint: Secondary | ICD-10-CM

## 2015-07-29 DIAGNOSIS — Z96653 Presence of artificial knee joint, bilateral: Secondary | ICD-10-CM | POA: Diagnosis present

## 2015-07-29 DIAGNOSIS — M810 Age-related osteoporosis without current pathological fracture: Secondary | ICD-10-CM | POA: Diagnosis present

## 2015-07-29 DIAGNOSIS — T8454XA Infection and inflammatory reaction due to internal left knee prosthesis, initial encounter: Principal | ICD-10-CM

## 2015-07-29 DIAGNOSIS — I739 Peripheral vascular disease, unspecified: Secondary | ICD-10-CM | POA: Diagnosis present

## 2015-07-29 DIAGNOSIS — N184 Chronic kidney disease, stage 4 (severe): Secondary | ICD-10-CM | POA: Diagnosis present

## 2015-07-29 DIAGNOSIS — I4891 Unspecified atrial fibrillation: Secondary | ICD-10-CM | POA: Diagnosis present

## 2015-07-29 DIAGNOSIS — I252 Old myocardial infarction: Secondary | ICD-10-CM

## 2015-07-29 DIAGNOSIS — E1129 Type 2 diabetes mellitus with other diabetic kidney complication: Secondary | ICD-10-CM | POA: Diagnosis present

## 2015-07-29 DIAGNOSIS — D649 Anemia, unspecified: Secondary | ICD-10-CM | POA: Diagnosis present

## 2015-07-29 DIAGNOSIS — M25562 Pain in left knee: Secondary | ICD-10-CM | POA: Diagnosis present

## 2015-07-29 DIAGNOSIS — M1990 Unspecified osteoarthritis, unspecified site: Secondary | ICD-10-CM | POA: Diagnosis present

## 2015-07-29 DIAGNOSIS — I10 Essential (primary) hypertension: Secondary | ICD-10-CM | POA: Diagnosis present

## 2015-07-29 DIAGNOSIS — Z7901 Long term (current) use of anticoagulants: Secondary | ICD-10-CM | POA: Diagnosis not present

## 2015-07-29 DIAGNOSIS — Z7984 Long term (current) use of oral hypoglycemic drugs: Secondary | ICD-10-CM

## 2015-07-29 DIAGNOSIS — R110 Nausea: Secondary | ICD-10-CM | POA: Diagnosis not present

## 2015-07-29 DIAGNOSIS — I639 Cerebral infarction, unspecified: Secondary | ICD-10-CM | POA: Diagnosis present

## 2015-07-29 HISTORY — PX: I & D KNEE WITH POLY EXCHANGE: SHX5024

## 2015-07-29 LAB — CBC WITH DIFFERENTIAL/PLATELET
BASOS ABS: 0 10*3/uL (ref 0.0–0.1)
Basophils Relative: 0 %
EOS PCT: 0 %
Eosinophils Absolute: 0 10*3/uL (ref 0.0–0.7)
HEMATOCRIT: 37.7 % — AB (ref 39.0–52.0)
HEMOGLOBIN: 12.1 g/dL — AB (ref 13.0–17.0)
LYMPHS PCT: 13 %
Lymphs Abs: 1.9 10*3/uL (ref 0.7–4.0)
MCH: 27.6 pg (ref 26.0–34.0)
MCHC: 32.1 g/dL (ref 30.0–36.0)
MCV: 85.9 fL (ref 78.0–100.0)
MONOS PCT: 5 %
Monocytes Absolute: 0.7 10*3/uL (ref 0.1–1.0)
NEUTROS PCT: 82 %
Neutro Abs: 12.2 10*3/uL — ABNORMAL HIGH (ref 1.7–7.7)
Platelets: 327 10*3/uL (ref 150–400)
RBC: 4.39 MIL/uL (ref 4.22–5.81)
RDW: 18.3 % — ABNORMAL HIGH (ref 11.5–15.5)
WBC: 14.8 10*3/uL — AB (ref 4.0–10.5)

## 2015-07-29 LAB — URINE MICROSCOPIC-ADD ON

## 2015-07-29 LAB — GLUCOSE, CAPILLARY
GLUCOSE-CAPILLARY: 146 mg/dL — AB (ref 65–99)
Glucose-Capillary: 145 mg/dL — ABNORMAL HIGH (ref 65–99)
Glucose-Capillary: 151 mg/dL — ABNORMAL HIGH (ref 65–99)

## 2015-07-29 LAB — TYPE AND SCREEN
ABO/RH(D): AB POS
Antibody Screen: NEGATIVE

## 2015-07-29 LAB — URINALYSIS, ROUTINE W REFLEX MICROSCOPIC
Bilirubin Urine: NEGATIVE
Glucose, UA: NEGATIVE mg/dL
Ketones, ur: NEGATIVE mg/dL
LEUKOCYTES UA: NEGATIVE
NITRITE: NEGATIVE
PH: 5 (ref 5.0–8.0)
Protein, ur: 100 mg/dL — AB
Specific Gravity, Urine: 1.025 (ref 1.005–1.030)

## 2015-07-29 LAB — COMPREHENSIVE METABOLIC PANEL
ALBUMIN: 2.8 g/dL — AB (ref 3.5–5.0)
ALK PHOS: 492 U/L — AB (ref 38–126)
ALT: 114 U/L — ABNORMAL HIGH (ref 17–63)
ANION GAP: 15 (ref 5–15)
AST: 76 U/L — AB (ref 15–41)
BILIRUBIN TOTAL: 0.8 mg/dL (ref 0.3–1.2)
BUN: 29 mg/dL — AB (ref 6–20)
CO2: 22 mmol/L (ref 22–32)
Calcium: 9.6 mg/dL (ref 8.9–10.3)
Chloride: 106 mmol/L (ref 101–111)
Creatinine, Ser: 2.34 mg/dL — ABNORMAL HIGH (ref 0.61–1.24)
GFR calc Af Amer: 27 mL/min — ABNORMAL LOW (ref >60)
GFR calc non Af Amer: 24 mL/min — ABNORMAL LOW (ref >60)
GLUCOSE: 167 mg/dL — AB (ref 65–99)
Potassium: 4.3 mmol/L (ref 3.5–5.1)
Sodium: 143 mmol/L (ref 135–145)
TOTAL PROTEIN: 7.2 g/dL (ref 6.5–8.1)

## 2015-07-29 LAB — PROTIME-INR
INR: 1.22 (ref 0.00–1.49)
Prothrombin Time: 15.5 seconds — ABNORMAL HIGH (ref 11.6–15.2)

## 2015-07-29 LAB — C-REACTIVE PROTEIN: CRP: 20.4 mg/dL — ABNORMAL HIGH (ref <1.0)

## 2015-07-29 LAB — SEDIMENTATION RATE: Sed Rate: 85 mm/hr — ABNORMAL HIGH (ref 0–16)

## 2015-07-29 LAB — APTT: aPTT: 30 seconds (ref 24–37)

## 2015-07-29 SURGERY — IRRIGATION AND DEBRIDEMENT KNEE WITH POLY EXCHANGE
Anesthesia: Spinal | Site: Knee | Laterality: Left

## 2015-07-29 MED ORDER — METOCLOPRAMIDE HCL 5 MG/ML IJ SOLN: 5.0000 mg | Freq: Three times a day (TID) | INTRAMUSCULAR | Status: DC | PRN

## 2015-07-29 MED ORDER — VANCOMYCIN HCL IN DEXTROSE 1-5 GM/200ML-% IV SOLN
1000.0000 mg | INTRAVENOUS | Status: AC
  Administered 2015-07-29: 1000 mg via INTRAVENOUS
  Filled 2015-07-29: qty 200

## 2015-07-29 MED ORDER — DOCUSATE SODIUM 100 MG PO CAPS
100.0000 mg | ORAL_CAPSULE | Freq: Two times a day (BID) | ORAL | Status: DC
  Administered 2015-07-29 – 2015-07-31 (×4): 100 mg via ORAL
  Filled 2015-07-29 (×4): qty 1

## 2015-07-29 MED ORDER — SODIUM CHLORIDE 0.9 % IV SOLN
INTRAVENOUS | Status: DC
  Administered 2015-07-30 – 2015-07-31 (×3): via INTRAVENOUS

## 2015-07-29 MED ORDER — 0.9 % SODIUM CHLORIDE (POUR BTL) OPTIME
TOPICAL | Status: DC | PRN
  Administered 2015-07-29: 1000 mL

## 2015-07-29 MED ORDER — EPHEDRINE SULFATE 50 MG/ML IJ SOLN
INTRAMUSCULAR | Status: AC
  Filled 2015-07-29: qty 1

## 2015-07-29 MED ORDER — SODIUM CHLORIDE 0.9 % IV SOLN
10.0000 mg | INTRAVENOUS | Status: DC | PRN
  Administered 2015-07-29: 25 ug/min via INTRAVENOUS

## 2015-07-29 MED ORDER — ACETAMINOPHEN 325 MG PO TABS: 650.0000 mg | ORAL_TABLET | Freq: Four times a day (QID) | ORAL | Status: DC | PRN

## 2015-07-29 MED ORDER — ACETAMINOPHEN 650 MG RE SUPP: 650.0000 mg | Freq: Four times a day (QID) | RECTAL | Status: DC | PRN

## 2015-07-29 MED ORDER — HYDROCODONE-ACETAMINOPHEN 5-325 MG PO TABS
1.0000 | ORAL_TABLET | ORAL | Status: DC | PRN
  Administered 2015-07-29 – 2015-07-30 (×2): 1 via ORAL
  Administered 2015-07-30 – 2015-07-31 (×5): 2 via ORAL
  Filled 2015-07-29 (×6): qty 2
  Filled 2015-07-29: qty 1

## 2015-07-29 MED ORDER — ROSUVASTATIN CALCIUM 10 MG PO TABS
20.0000 mg | ORAL_TABLET | Freq: Every day | ORAL | Status: DC
  Administered 2015-07-30 – 2015-07-31 (×2): 20 mg via ORAL
  Filled 2015-07-29 (×2): qty 2

## 2015-07-29 MED ORDER — FENTANYL CITRATE (PF) 100 MCG/2ML IJ SOLN
INTRAMUSCULAR | Status: AC
  Filled 2015-07-29: qty 2

## 2015-07-29 MED ORDER — ACETAMINOPHEN 10 MG/ML IV SOLN
INTRAVENOUS | Status: AC
  Administered 2015-07-29: 1000 mg via INTRAVENOUS
  Filled 2015-07-29: qty 100

## 2015-07-29 MED ORDER — SODIUM CHLORIDE 0.9 % IJ SOLN
INTRAMUSCULAR | Status: DC | PRN
  Administered 2015-07-29 (×3): 10 mL

## 2015-07-29 MED ORDER — ONDANSETRON HCL 4 MG/2ML IJ SOLN: 4.0000 mg | Freq: Four times a day (QID) | INTRAMUSCULAR | Status: DC | PRN

## 2015-07-29 MED ORDER — HYDROMORPHONE HCL 1 MG/ML IJ SOLN
0.5000 mg | INTRAMUSCULAR | Status: DC | PRN
  Administered 2015-07-30 (×2): 0.5 mg via INTRAVENOUS
  Filled 2015-07-29 (×2): qty 1

## 2015-07-29 MED ORDER — INSULIN GLARGINE 100 UNIT/ML SOLOSTAR PEN: 5.0000 [IU] | PEN_INJECTOR | Freq: Every day | SUBCUTANEOUS | Status: DC | PRN

## 2015-07-29 MED ORDER — HYDROCODONE-ACETAMINOPHEN 5-325 MG PO TABS
ORAL_TABLET | ORAL | Status: AC
  Administered 2015-07-29: 2
  Filled 2015-07-29: qty 2

## 2015-07-29 MED ORDER — HYDROMORPHONE HCL 1 MG/ML IJ SOLN
0.2500 mg | INTRAMUSCULAR | Status: AC | PRN
  Administered 2015-07-29 (×4): 0.25 mg via INTRAVENOUS

## 2015-07-29 MED ORDER — DEXAMETHASONE SODIUM PHOSPHATE 10 MG/ML IJ SOLN
10.0000 mg | Freq: Once | INTRAMUSCULAR | Status: AC
  Administered 2015-07-30: 10 mg via INTRAVENOUS
  Filled 2015-07-29: qty 1

## 2015-07-29 MED ORDER — ONDANSETRON HCL 4 MG PO TABS: 4.0000 mg | ORAL_TABLET | Freq: Four times a day (QID) | ORAL | Status: DC | PRN

## 2015-07-29 MED ORDER — LINAGLIPTIN 5 MG PO TABS
5.0000 mg | ORAL_TABLET | Freq: Every day | ORAL | Status: DC
  Administered 2015-07-30 – 2015-07-31 (×2): 5 mg via ORAL
  Filled 2015-07-29 (×2): qty 1

## 2015-07-29 MED ORDER — HYDROMORPHONE HCL 1 MG/ML IJ SOLN
INTRAMUSCULAR | Status: AC
  Administered 2015-07-29: 0.25 mg via INTRAVENOUS
  Filled 2015-07-29: qty 1

## 2015-07-29 MED ORDER — PHENOL 1.4 % MT LIQD: 1.0000 | OROMUCOSAL | Status: DC | PRN

## 2015-07-29 MED ORDER — LUBIPROSTONE 8 MCG PO CAPS
8.0000 ug | ORAL_CAPSULE | Freq: Every day | ORAL | Status: DC
  Administered 2015-07-30 – 2015-07-31 (×2): 8 ug via ORAL
  Filled 2015-07-29 (×2): qty 1

## 2015-07-29 MED ORDER — SENNA 8.6 MG PO TABS
2.0000 | ORAL_TABLET | Freq: Every day | ORAL | Status: DC
  Administered 2015-07-29 – 2015-07-30 (×2): 17.2 mg via ORAL
  Filled 2015-07-29 (×2): qty 2

## 2015-07-29 MED ORDER — PROPOFOL 500 MG/50ML IV EMUL
INTRAVENOUS | Status: DC | PRN
  Administered 2015-07-29: 25 ug/kg/min via INTRAVENOUS

## 2015-07-29 MED ORDER — EYE WASH 99.05 % OP SOLN: 1.0000 [drp] | Freq: Two times a day (BID) | OPHTHALMIC | Status: DC

## 2015-07-29 MED ORDER — PANTOPRAZOLE SODIUM 40 MG PO TBEC
80.0000 mg | DELAYED_RELEASE_TABLET | Freq: Every day | ORAL | Status: DC
  Administered 2015-07-30 – 2015-07-31 (×2): 80 mg via ORAL
  Filled 2015-07-29 (×2): qty 2

## 2015-07-29 MED ORDER — KETOROLAC TROMETHAMINE 30 MG/ML IJ SOLN
INTRAMUSCULAR | Status: AC
  Filled 2015-07-29: qty 1

## 2015-07-29 MED ORDER — CEFAZOLIN SODIUM-DEXTROSE 2-3 GM-% IV SOLR
2.0000 g | INTRAVENOUS | Status: AC
  Administered 2015-07-29: 2 g via INTRAVENOUS
  Filled 2015-07-29: qty 50

## 2015-07-29 MED ORDER — ALUM & MAG HYDROXIDE-SIMETH 200-200-20 MG/5ML PO SUSP: 30.0000 mL | ORAL | Status: DC | PRN

## 2015-07-29 MED ORDER — FENTANYL CITRATE (PF) 100 MCG/2ML IJ SOLN
INTRAMUSCULAR | Status: AC
  Administered 2015-07-29: 50 ug via INTRAVENOUS
  Filled 2015-07-29: qty 2

## 2015-07-29 MED ORDER — HYDROMORPHONE HCL 1 MG/ML IJ SOLN: 0.5000 mg | INTRAMUSCULAR | Status: DC | PRN

## 2015-07-29 MED ORDER — SODIUM CHLORIDE 0.9 % IV SOLN
INTRAVENOUS | Status: DC
  Administered 2015-07-29 (×2): via INTRAVENOUS

## 2015-07-29 MED ORDER — GLIPIZIDE 5 MG PO TABS
5.0000 mg | ORAL_TABLET | Freq: Every day | ORAL | Status: DC
  Filled 2015-07-29: qty 1

## 2015-07-29 MED ORDER — MENTHOL 3 MG MT LOZG: 1.0000 | LOZENGE | OROMUCOSAL | Status: DC | PRN

## 2015-07-29 MED ORDER — POLYVINYL ALCOHOL 1.4 % OP SOLN
1.0000 [drp] | Freq: Two times a day (BID) | OPHTHALMIC | Status: DC
  Filled 2015-07-29 (×2): qty 15

## 2015-07-29 MED ORDER — DILTIAZEM HCL ER BEADS 240 MG PO CP24
360.0000 mg | ORAL_CAPSULE | Freq: Every day | ORAL | Status: DC
  Filled 2015-07-29: qty 1

## 2015-07-29 MED ORDER — ACETAMINOPHEN 10 MG/ML IV SOLN
1000.0000 mg | Freq: Four times a day (QID) | INTRAVENOUS | Status: DC
  Administered 2015-07-29: 1000 mg via INTRAVENOUS

## 2015-07-29 MED ORDER — FENTANYL CITRATE (PF) 250 MCG/5ML IJ SOLN
INTRAMUSCULAR | Status: AC
  Filled 2015-07-29: qty 5

## 2015-07-29 MED ORDER — FENTANYL CITRATE (PF) 100 MCG/2ML IJ SOLN
25.0000 ug | INTRAMUSCULAR | Status: DC | PRN
  Administered 2015-07-29 (×3): 50 ug via INTRAVENOUS

## 2015-07-29 MED ORDER — LACTATED RINGERS IV SOLN: INTRAVENOUS | Status: DC

## 2015-07-29 MED ORDER — ENOXAPARIN SODIUM 80 MG/0.8ML ~~LOC~~ SOLN: 1.0000 mg/kg | SUBCUTANEOUS | Status: DC

## 2015-07-29 MED ORDER — SODIUM CHLORIDE 0.9 % IR SOLN
Status: DC | PRN
  Administered 2015-07-29 (×3): 3000 mL

## 2015-07-29 MED ORDER — CHLORHEXIDINE GLUCONATE 4 % EX LIQD: 60.0000 mL | Freq: Once | CUTANEOUS | Status: DC

## 2015-07-29 MED ORDER — GLIPIZIDE 5 MG PO TABS
10.0000 mg | ORAL_TABLET | Freq: Every day | ORAL | Status: DC
  Administered 2015-07-31: 10 mg via ORAL
  Filled 2015-07-29: qty 2

## 2015-07-29 MED ORDER — PROPOFOL 10 MG/ML IV BOLUS
INTRAVENOUS | Status: DC | PRN
  Administered 2015-07-29 (×4): 10 mg via INTRAVENOUS

## 2015-07-29 MED ORDER — BUPIVACAINE-EPINEPHRINE (PF) 0.5% -1:200000 IJ SOLN
INTRAMUSCULAR | Status: AC
  Filled 2015-07-29: qty 30

## 2015-07-29 MED ORDER — INSULIN GLARGINE 100 UNIT/ML ~~LOC~~ SOLN
10.0000 [IU] | Freq: Every day | SUBCUTANEOUS | Status: DC
Start: 2015-07-29 — End: 2015-07-31
  Administered 2015-07-29 – 2015-07-30 (×2): 10 [IU] via SUBCUTANEOUS
  Filled 2015-07-29 (×3): qty 0.1

## 2015-07-29 MED ORDER — DIPHENHYDRAMINE HCL 12.5 MG/5ML PO ELIX: 12.5000 mg | ORAL_SOLUTION | ORAL | Status: DC | PRN

## 2015-07-29 MED ORDER — KETOROLAC TROMETHAMINE 30 MG/ML IJ SOLN
INTRAMUSCULAR | Status: DC | PRN
  Administered 2015-07-29: 30 mg via INTRA_ARTICULAR

## 2015-07-29 MED ORDER — BENAZEPRIL HCL 20 MG PO TABS
20.0000 mg | ORAL_TABLET | Freq: Every day | ORAL | Status: DC
  Administered 2015-07-30: 20 mg via ORAL
  Filled 2015-07-29 (×2): qty 1

## 2015-07-29 MED ORDER — MEPERIDINE HCL 25 MG/ML IJ SOLN: 6.2500 mg | INTRAMUSCULAR | Status: DC | PRN

## 2015-07-29 MED ORDER — PROMETHAZINE HCL 25 MG/ML IJ SOLN: 6.2500 mg | INTRAMUSCULAR | Status: DC | PRN

## 2015-07-29 MED ORDER — METOCLOPRAMIDE HCL 5 MG PO TABS: 5.0000 mg | ORAL_TABLET | Freq: Three times a day (TID) | ORAL | Status: DC | PRN

## 2015-07-29 MED ORDER — VANCOMYCIN HCL IN DEXTROSE 1-5 GM/200ML-% IV SOLN
1000.0000 mg | INTRAVENOUS | Status: DC
  Administered 2015-07-30 – 2015-07-31 (×2): 1000 mg via INTRAVENOUS
  Filled 2015-07-29 (×3): qty 200

## 2015-07-29 MED ORDER — FENTANYL CITRATE (PF) 100 MCG/2ML IJ SOLN
INTRAMUSCULAR | Status: DC | PRN
  Administered 2015-07-29 (×10): 25 ug via INTRAVENOUS

## 2015-07-29 MED ORDER — BUPIVACAINE-EPINEPHRINE (PF) 0.5% -1:200000 IJ SOLN
INTRAMUSCULAR | Status: DC | PRN
  Administered 2015-07-29: 30 mL

## 2015-07-29 MED ORDER — SODIUM CHLORIDE 0.9 % IJ SOLN
INTRAMUSCULAR | Status: AC
  Filled 2015-07-29: qty 10

## 2015-07-29 SURGICAL SUPPLY — 52 items
ALCOHOL ISOPROPYL (RUBBING) (MISCELLANEOUS) ×3 IMPLANT
BANDAGE ELASTIC 6 VELCRO ST LF (GAUZE/BANDAGES/DRESSINGS) ×3 IMPLANT
BANDAGE ESMARK 6X9 LF (GAUZE/BANDAGES/DRESSINGS) ×1 IMPLANT
BEARIN OSTEO TIBIAL (Orthopedic Implant) ×3 IMPLANT
BEARING OSTEO TIBIAL (Orthopedic Implant) ×1 IMPLANT
BLADE SAW RECIP 87.9 MT (BLADE) ×3 IMPLANT
BNDG ESMARK 6X9 LF (GAUZE/BANDAGES/DRESSINGS) ×3
BRUSH FEMORAL CANAL (MISCELLANEOUS) ×3 IMPLANT
CHLORAPREP W/TINT 26ML (MISCELLANEOUS) ×3 IMPLANT
COTTON STERILE ROLL (GAUZE/BANDAGES/DRESSINGS) ×3 IMPLANT
CUFF TOURNIQUET SINGLE 34IN LL (TOURNIQUET CUFF) ×3 IMPLANT
DERMABOND ADVANCED (GAUZE/BANDAGES/DRESSINGS) ×2
DERMABOND ADVANCED .7 DNX12 (GAUZE/BANDAGES/DRESSINGS) ×1 IMPLANT
DRAIN HEMOVAC 7FR (DRAIN) ×3 IMPLANT
DRAPE EXTREMITY T 121X128X90 (DRAPE) ×3 IMPLANT
DRAPE POUCH INSTRU U-SHP 10X18 (DRAPES) ×3 IMPLANT
DRAPE U-SHAPE 47X51 STRL (DRAPES) ×3 IMPLANT
DRSG AQUACEL AG ADV 3.5X10 (GAUZE/BANDAGES/DRESSINGS) ×3 IMPLANT
DRSG AQUACEL AG ADV 3.5X14 (GAUZE/BANDAGES/DRESSINGS) ×3 IMPLANT
DRSG TEGADERM 2-3/8X2-3/4 SM (GAUZE/BANDAGES/DRESSINGS) IMPLANT
ELECT REM PT RETURN 9FT ADLT (ELECTROSURGICAL) ×3
ELECTRODE REM PT RTRN 9FT ADLT (ELECTROSURGICAL) ×1 IMPLANT
EVACUATOR 1/8 PVC DRAIN (DRAIN) IMPLANT
GLOVE BIO SURGEON STRL SZ8.5 (GLOVE) ×6 IMPLANT
GLOVE BIOGEL PI IND STRL 8.5 (GLOVE) ×1 IMPLANT
GLOVE BIOGEL PI INDICATOR 8.5 (GLOVE) ×2
GOWN STRL REUS W/TWL 2XL LVL3 (GOWN DISPOSABLE) ×3 IMPLANT
HANDPIECE INTERPULSE COAX TIP (DISPOSABLE) ×2
KIT BASIN OR (CUSTOM PROCEDURE TRAY) ×3 IMPLANT
MANIFOLD NEPTUNE II (INSTRUMENTS) ×3 IMPLANT
NEEDLE SPNL 18GX3.5 QUINCKE PK (NEEDLE) ×6 IMPLANT
PACK TOTAL JOINT (CUSTOM PROCEDURE TRAY) ×3 IMPLANT
PADDING CAST COTTON 6X4 STRL (CAST SUPPLIES) IMPLANT
SAW OSC TIP CART 19.5X105X1.3 (SAW) ×3 IMPLANT
SEALER BIPOLAR AQUA 6.0 (INSTRUMENTS) ×3 IMPLANT
SET HNDPC FAN SPRY TIP SCT (DISPOSABLE) ×1 IMPLANT
SET PAD KNEE POSITIONER (MISCELLANEOUS) ×3 IMPLANT
SUCTION FRAZIER HANDLE 10FR (MISCELLANEOUS) ×2
SUCTION TUBE FRAZIER 10FR DISP (MISCELLANEOUS) ×1 IMPLANT
SUT MNCRL AB 3-0 PS2 27 (SUTURE) ×3 IMPLANT
SUT MON AB 2-0 CT1 36 (SUTURE) ×9 IMPLANT
SUT PDS AB 1 CT  36 (SUTURE) ×4
SUT PDS AB 1 CT 36 (SUTURE) ×2 IMPLANT
SUT VIC AB 1 CTX 27 (SUTURE) ×3 IMPLANT
SUT VIC AB 2-0 CT1 27 (SUTURE) ×2
SUT VIC AB 2-0 CT1 TAPERPNT 27 (SUTURE) ×1 IMPLANT
SUT VLOC 180 0 24IN GS25 (SUTURE) ×6 IMPLANT
SYR 50ML LL SCALE MARK (SYRINGE) ×6 IMPLANT
TOWEL OR 17X26 10 PK STRL BLUE (TOWEL DISPOSABLE) ×6 IMPLANT
TOWEL OR NON WOVEN STRL DISP B (DISPOSABLE) IMPLANT
TOWER CARTRIDGE SMART MIX (DISPOSABLE) ×3 IMPLANT
WRAP KNEE MAXI GEL POST OP (GAUZE/BANDAGES/DRESSINGS) ×3 IMPLANT

## 2015-07-29 NOTE — H&P (Signed)
PREOPERATIVE H&P  Chief Complaint: JOINT INFECTION  LEFT KNEE   HPI: Dean Murphy is a 82 y.o. male who presents for preoperative history and physical with a diagnosis of JOINT INFECTION  LEFT KNEE . H/o left TKA 18-19 years ago by Dr. Noemi Chapel. Has about one week of left knee pain and swelling. Aspirated in the office, started on PO doxycycline, and set up for I&D.  He has elected for surgical management. Stopped eliquis 3 days ago.  Past Medical History  Diagnosis Date  . GERD (gastroesophageal reflux disease)   . Hyperlipidemia   . Essential hypertension   . Arthritis   . Diverticulosis   . Hemorrhoids   . Colon polyps   . CAD (coronary artery disease)     a. CABG 1989, b. Myoview low risk 2012.  [name] CHB (complete heart block) [facility]) March 2016    a. s/p STJ dual chamber pacemaker 07/2014.  . Osteoporosis   . PAF (paroxysmal atrial fibrillation) [facility]) March 2016  . Chronic diastolic CHF (congestive heart failure) ([place])     a. Dx 01/7025 - acute diastolic CHF in the setting of CHB.  . Carotid artery occlusion     a. Duplex 10/2014: patent R/L CEA with mild hyperplasia in right surgical bulb - followed by vascular.  . Myocardial infarction [facility]) 1976  and Mar. 19, 2016  . CVA (cerebral infarction)   . Orthostasis   . Anemia   . Complication of anesthesia   . PONV (postoperative nausea and vomiting)   . Presence of permanent cardiac pacemaker   . Shortness of breath dyspnea     due to pain  . Diabetes mellitus     type II  . Chronic kidney disease, stage IV (severe) (HCC)     stage III/IV as of 2015, [name]  . CKD (chronic kidney disease), stage IV ([place])   . Constipation   . Dupuytren's disease     finger right hand contracted   Past Surgical History  Procedure Laterality Date  . Coronary artery bypass graft  1989  . Cholecystectomy    . Total knee arthroplasty      bilateral  . Tonsillectomy    . Carotid endarterectomy  12/31/10    Right  . Carotid  endarterectomy  02/09/11    left  . Joint replacement      bilat. knees  . Cardiac catheterization  08/11/2014    Procedure: TEMPORARY PACEMAKER;  Surgeon: [name], MD;  Location[facility] CATH LAB;  Service: Cardiovascular;;  . Permanent pacemaker insertion N/A 08/13/2014    STJ dual chamber pacemaker implanted by [name] for CHB  . Eye surgery Bilateral     Cataract with implants   Social History   Social History  . Marital Status: Married    Spouse Name: N/A  . Number of Children: N/A  . Years of Education: N/A   Occupational History  . retired    Social History Main Topics  . Smoking status: Former Smoker -- 1.00 packs/day for 25 years    Types: Cigarettes    Quit date: 01/21/1973  . Smokeless tobacco: Never Used     Comment: began smoking in high school, quit age 63  . Alcohol Use: 0.0 oz/week    0 Standard drinks or equivalent per week     Comment: 1-2 per week  . Drug Use: No  . Sexual Activity: Not Asked   Other Topics Concern  . 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  Remarried 1998.     3 sons   Family History  Problem Relation Age of Onset  . Diabetes    . Cancer    . Heart disease Mother     Before age 69  . Diabetes Mother   . Varicose Veins Mother   . Heart attack Mother   . Heart attack Father   . Heart disease Father     After age 47  . Hypertension Father   . Colon cancer Neg Hx   . Heart disease Brother     Before age 27  . Hypertension Brother   . Heart attack Brother   . Diabetes Son   . Hypertension Son   . Hypertension Son   . Diabetes Son    Allergies  Allergen Reactions  . Reclast [Zoledronic Acid] Other (See Comments)    Stopped 2015 due to Creatine    Prior to Admission medications   Medication Sig Start Date End Date Taking? Authorizing Provider  benazepril (LOTENSIN) 20 MG tablet Take 1 tablet (20 mg total) by mouth daily. 03/15/15  Yes Joaquim Nam, MD  diltiazem [facility]) 360 MG 24 hr capsule Take 1 capsule (360 mg total) by mouth daily. 07/09/15  Yes Lars Masson, MD  doxycycline (DORYX) 100 MG EC tablet Take 100 mg by mouth 2 (two) times daily. For 7 day patient started this Thursday night   Yes Historical Provider, MD  glipiZIDE (GLUCOTROL) 5 MG tablet Take 2 tablets (10 mg total) by mouth daily before breakfast. And 1 tab before supper 01/21/15  Yes Joaquim Nam, MD  HYDROcodone-acetaminophen (NORCO/VICODIN) 5-325 MG tablet Take 2 tablets by mouth every 6 (six) hours as needed for moderate pain.   Yes Historical Provider, MD  lubiprostone (AMITIZA) 8 MCG capsule TAKE 1 CAPSULE BY MOUTH  DAILY WITH  BREAKFAST 03/15/15  Yes Joaquim Nam, MD  omeprazole (PRILOSEC) 40 MG capsule Take 1 capsule (40 mg total) by mouth daily. 09/07/14  Yes Doreene Nest, NP  rosuvastatin (CRESTOR) 20 MG tablet Take 1 tablet (20 mg total) by mouth daily. 10/16/14  Yes Lars Masson, MD  sitaGLIPtin (JANUVIA) 50 MG tablet Take 1 tablet (50 mg total) by mouth daily. 03/15/15  Yes Joaquim Nam, MD  apixaban (ELIQUIS) 2.5 MG TABS tablet Take 1 tablet (2.5 mg total) by mouth 2 (two) times daily. 10/16/14   Lars Masson, MD  ferrous sulfate (CVS IRON) 325 (65 FE) MG tablet Take 1 tablet (325 mg total) by mouth 2 (two) times daily with a meal. Patient taking differently: Take 325 mg by mouth 2 (two) times daily with a meal. Mon-Fri, skip weekends 06/24/15   Joaquim Nam, MD  furosemide (LASIX) 40 MG tablet Take 1 tablet (40 mg total) by mouth daily as needed. Patient taking differently: Take 40 mg by mouth daily as needed for fluid (swelling).  03/15/14 07/25/15  Joaquim Nam, MD  Insulin Glargine (LANTUS SOLOSTAR) 100 UNIT/ML Solostar Pen Inject 5-10 Units into the skin daily at 10 pm. Patient taking differently: Inject 5-10 Units into the skin daily as needed (Before breakfast).  03/15/15   Joaquim Nam, MD  Insulin Pen Needle 31G X 5  MM MISC Use daily with insulin pen 03/15/15   Joaquim Nam, MD  NON FORMULARY daily. careconcepts lancets an d glucometer  strips    Historical Provider, MD  Ophthalmic Irrigation Solution (EYE WASH) 99.05 % SOLN Place 1 drop into both eyes 2 (two) times daily.    Historical Provider, MD     Positive ROS: All other systems have been reviewed and were otherwise negative with the exception of those mentioned in the HPI and as above.  Physical Exam: General: Alert, no acute distress Cardiovascular: No pedal edema Respiratory: No cyanosis, no use of accessory musculature GI: No organomegaly, abdomen is soft and non-tender Skin: No lesions in the area of chief complaint Neurologic: Sensation intact distally Psychiatric: Patient is competent for consent with normal mood and affect Lymphatic: No axillary or cervical lymphadenopathy  MUSCULOSKELETAL: L knee incision healed. (+) warmth and effusion. + TA/GS/EHL. SILT. 1+ DP.  3.2.17 blood work: WBC 11.9 ESR 110 mm/hr CRP 22.4 md/dL  07/24/15 synovial fluid: WBC 23,000 with 93% Neutrophils Alpha defensin positive Culture: Enterococcus faecalis  Assessment: JOINT INFECTION  LEFT KNEE   Plan: Plan for Procedure(s): IRRIGATION AND DEBRIDEMENT LEFT KNEE WITH POLY EXCHANGE  The risks benefits and alternatives were discussed with the patient including but not limited to the risks of nonoperative treatment, versus surgical intervention including failure to erdaicate infection, bleeding, nerve injury,  blood clots, cardiopulmonary complications, morbidity, mortality, additional surgery (resection arthroplasty) among others, and they were willing to proceed.   Irem Stoneham, Horald Pollen, MD Cell [phone]   07/29/2015 12:54 PM

## 2015-07-29 NOTE — Anesthesia Procedure Notes (Signed)
Spinal Patient location during procedure: OR Start time: 07/29/2015 1:10 PM End time: 07/29/2015 1:12 PM Staffing Anesthesiologist: Suella Broad D Performed by: anesthesiologist  Preanesthetic Checklist Completed: patient identified, site marked, surgical consent, pre-op evaluation, timeout performed, IV checked, risks and benefits discussed and monitors and equipment checked Spinal Block Patient position: sitting Prep: Betadine Patient monitoring: heart rate, continuous pulse ox, blood pressure and cardiac monitor Approach: midline Location: L4-5 Injection technique: single-shot Needle Needle type: Whitacre and Introducer  Needle gauge: 24 G Needle length: 9 cm Additional Notes Negative paresthesia. Negative blood return. Positive free-flowing CSF. Expiration date of kit checked and confirmed. Patient tolerated procedure well, without complications.

## 2015-07-29 NOTE — Brief Op Note (Signed)
07/29/2015  3:02 PM  PATIENT:  Dean Murphy  82 y.o. male  PRE-OPERATIVE DIAGNOSIS:  PERIPROSTHETIC JOINT INFECTION  LEFT KNEE   POST-OPERATIVE DIAGNOSIS:  PERIPROSTHETIC JOINT INFECTION  LEFT KNEE   PROCEDURE:  Procedure(s): IRRIGATION AND DEBRIDEMENT LEFT KNEE WITH POLY EXCHANGE (Left)  SURGEON:  Surgeon(s) and Role:    * Rod Can, MD - Primary  PHYSICIAN ASSISTANT: None  ASSISTANTS: justin queen, rnfa   ANESTHESIA:   spinal  EBL:  Total I/O In: 700 [I.V.:700] Out: 200 [Blood:200]  BLOOD ADMINISTERED:none  DRAINS: (1 medium) Hemovact drain(s) in the left knee with  Suction Open   LOCAL MEDICATIONS USED:  MARCAINE     SPECIMEN:  Source of Specimen:  left knee synovium and Aspirate  DISPOSITION OF SPECIMEN:  micro  COUNTS:  YES  TOURNIQUET:   Total Tourniquet Time Documented: Thigh (Left) - 62 minutes Total: Thigh (Left) - 62 minutes   DICTATION: .Other Dictation: Dictation Number [id number]  PLAN OF CARE: Admit to inpatient   PATIENT DISPOSITION:  PACU - hemodynamically stable.   Delay start of Pharmacological VTE agent (>24hrs) due to surgical blood loss or risk of bleeding: no

## 2015-07-29 NOTE — Anesthesia Preprocedure Evaluation (Addendum)
Anesthesia Evaluation  Patient identified by MRN, date of birth, ID band Patient awake    Reviewed: Allergy & Precautions, NPO status , Patient's Chart, lab work & pertinent test results  History of Anesthesia Complications (+) PONV and history of anesthetic complications  Airway Mallampati: I  TM Distance: >3 FB Neck ROM: Full    Dental  (+) Teeth Intact   Pulmonary former smoker,    breath sounds clear to auscultation       Cardiovascular hypertension, + CAD, + Past MI, + CABG, + Peripheral Vascular Disease and +CHF  + dysrhythmias Atrial Fibrillation + pacemaker  Rhythm:Regular Rate:Normal     Neuro/Psych CVA negative psych ROS   GI/Hepatic PUD, GERD  Medicated,  Endo/Other  diabetes, Type 2, Insulin Dependent  Renal/GU CRFRenal disease  negative genitourinary   Musculoskeletal  (+) Arthritis ,   Abdominal   Peds negative pediatric ROS (+)  Hematology   Anesthesia Other Findings   Reproductive/Obstetrics negative OB ROS                            Lab Results  Component Value Date   WBC 7.6 06/20/2015   HGB 9.9* 06/20/2015   HCT 30.6* 06/20/2015   MCV 79.3 06/20/2015   PLT 166.0 06/20/2015   Lab Results  Component Value Date   CREATININE 2.05* 06/20/2015   BUN 31* 06/20/2015   NA 141 06/20/2015   K 4.9 06/20/2015   CL 109 06/20/2015   CO2 24 06/20/2015   Lab Results  Component Value Date   INR 1.16 08/11/2014   INR 0.98 02/05/2011   INR 1.08 01/01/2011   06/2015 EKG: normal sinus rhythm, Paced.    Anesthesia Physical Anesthesia Plan  ASA: III  Anesthesia Plan: Spinal   Post-op Pain Management:    Induction: Intravenous  Airway Management Planned: Simple Face Mask and Natural Airway  Additional Equipment:   Intra-op Plan:   Post-operative Plan:   Informed Consent: I have reviewed the patients History and Physical, chart, labs and discussed the procedure  including the risks, benefits and alternatives for the proposed anesthesia with the patient or authorized representative who has indicated his/her understanding and acceptance.   Dental advisory given  Plan Discussed with: CRNA  Anesthesia Plan Comments: (Mr. Behler has not taken apixaban for 3 days. )       Anesthesia Quick Evaluation

## 2015-07-29 NOTE — Progress Notes (Signed)
Pharmacy Antibiotic Note  Dean Murphy is a 82 y.o. male with hx DJD s/p a L-TKA ~19 years ago who was admitted on 07/29/2015 for a planned I&D with poly-exchange for a L-knee prosthetic joint infection. Outpatient cultures were taken on 3/1 and have resulted as enterococcus sensitive to ampicillin and vancomycin. PTA the patient was on doxycycline, ID has been consulted and asked pharmacy to transition the patient to Vancomycin post-op. Noted hx of CKD, current SCr 2.34 (baseline~1.7-2), CrCl~20-30 ml/min.   The patient received Vancomycin 1g at 1330 today pre-op. Since the patient did not get an "official" loading dose - will schedule the next dose of Vancomycin to be given a little earlier.   Goal of Therapy:  Vancomycin trough of 15-20 mcg/ml  Plan: 1. Vancomycin 1g IV every 24 hours 2. Will continue to follow renal function, culture results, LOT, and antibiotic de-escalation plans   Height: 5\' 9"  (175.3 cm) Weight: 205 lb (92.987 kg) IBW/kg (Calculated) : 70.7  Temp (24hrs), Avg:97.9 F (36.6 C), Min:97.9 F (36.6 C), Max:97.9 F (36.6 C)   Recent Labs Lab 07/29/15 1104  WBC 14.8*  CREATININE 2.34*    Estimated Creatinine Clearance: 25.5 mL/min (by C-G formula based on Cr of 2.34).    Allergies  Allergen Reactions  . Reclast [Zoledronic Acid] Other (See Comments)    Stopped 2015 due to Creatine     Antimicrobials this admission: Doxy PTA Vanc 3/6 >>  Dose adjustments this admission: n/a  Microbiology results: 3/6 Tissue cx >> 3/6 L-knee synovial fluid cx >>  Thank you for allowing pharmacy to be a part of this patient's care.  Alycia Rossetti, PharmD, BCPS Clinical Pharmacist Pager: [phone] 07/29/2015 5:43 PM

## 2015-07-29 NOTE — Transfer of Care (Signed)
Immediate Anesthesia Transfer of Care Note  Patient: Dean Murphy  Procedure(s) Performed: Procedure(s): IRRIGATION AND DEBRIDEMENT LEFT KNEE WITH POLY EXCHANGE (Left)  Patient Location: PACU  Anesthesia Type:Spinal  Level of Consciousness: awake, alert  and oriented  Airway & Oxygen Therapy: Patient Spontanous Breathing and Patient connected to face mask oxygen  Post-op Assessment: Report given to RN, Post -op Vital signs reviewed and stable and Patient moving all extremities X 4  Post vital signs: Reviewed and stable  Last Vitals:  Filed Vitals:   07/29/15 1059 07/29/15 1541  BP: 136/59 138/61  Pulse: 76 65  Temp: 36.6 C 36.6 C  Resp:  14    Complications: No apparent anesthesia complications

## 2015-07-29 NOTE — Consult Note (Signed)
[facility] for Infectious Disease    Date of Admission:  07/29/2015           Day 1 vancomycin       Reason for Consult: Enterococcal left prosthetic knee infection    Referring Physician: Dr. Rod Can  Principal Problem:   Infection of prosthetic left knee joint ([place] [place]) Active Problems:   DM (diabetes mellitus), type 2 with renal complications ([place])   Dyslipidemia   Essential hypertension   Osteoarthritis   PVD- s/p bilat CEA- CA dopplers OK Jan 2015   Complete heart block ([facility])   Acute on chronic renal insufficiency ([facility])   S/P CABG x 5 1989. Low risk Myoview 2012   Atrial fibrillation-CHADS VASC2 = 5 (age, HTN, DM, Vasc)   Anemia   Pacemaker   Stroke [facility])   Coronary artery disease   . acetaminophen  1,000 mg Intravenous 4 times per day  . chlorhexidine  60 mL Topical Once  . fentaNYL        Recommendations: 1. Continue vancomycin 2. We'll need interventional radiology to place a central line (rather than PICC because of his chronic renal insufficiency)   Assessment: He has acute enterococcal left prosthetic knee infection. He will need 6 weeks of IV antibiotics followed by several months of oral amoxicillin. He has chronic renal insufficiency so we will need to avoid a peripherally inserted central catheter. Vancomycin will provide more convenient dosing than ampicillin. I will follow-up tomorrow.    HPI: Dean Murphy is a 82 y.o. male with degenerative joint disease who underwent left total knee arthroplasty about 19 years ago. About 10 days ago he had sudden onset of severe left knee pain after playing a round of golf. He also began to have chills and night sweats. He did not take his temperature. He was seen by Dr. Lyla Glassing on 07/24/2015. He underwent arthrocentesis and cultures have grown enterococcus sensitive to ampicillin and vancomycin. He was admitted and underwent incision and drainage with poly-exchange today.   Review of  Systems: Review of Systems  Constitutional: Positive for chills and diaphoresis. Negative for fever, weight loss and malaise/fatigue.  HENT: Negative for sore throat.   Respiratory: Negative for cough, sputum production and shortness of breath.   Cardiovascular: Negative for chest pain.  Gastrointestinal: Negative for nausea, vomiting, abdominal pain and diarrhea.  Genitourinary: Negative for dysuria.  Musculoskeletal: Positive for joint pain. Negative for myalgias.  Skin: Negative for rash.  Neurological: Negative for headaches.  Psychiatric/Behavioral: Positive for substance abuse.    Past Medical History  Diagnosis Date  . GERD (gastroesophageal reflux disease)   . Hyperlipidemia   . Essential hypertension   . Arthritis   . Diverticulosis   . Hemorrhoids   . Colon polyps   . CAD (coronary artery disease)     a. CABG 1989, b. Myoview low risk 2012.  [name] CHB (complete heart block) [facility]) March 2016    a. s/p STJ dual chamber pacemaker 07/2014.  . Osteoporosis   . PAF (paroxysmal atrial fibrillation) [facility]) March 2016  . Chronic diastolic CHF (congestive heart failure) ([place])     a. Dx [id number] - acute diastolic CHF in the setting of CHB.  . Carotid artery occlusion     a. Duplex 10/2014: patent R/L CEA with mild hyperplasia in right surgical bulb - followed by vascular.  . Myocardial infarction [facility]) 1976  and Mar. 19, 2016  . CVA (cerebral infarction)   .  Orthostasis   . Anemia   . Complication of anesthesia   . PONV (postoperative nausea and vomiting)   . Presence of permanent cardiac pacemaker   . Shortness of breath dyspnea     due to pain  . Diabetes mellitus     type II  . Chronic kidney disease, stage IV (severe) (HCC)     stage III/IV as of 2015, [name]  . CKD (chronic kidney disease), stage IV ([place])   . Constipation   . Dupuytren's disease     finger right hand contracted    Social History  Substance Use Topics  . Smoking status: Former Smoker --  1.00 packs/day for 25 years    Types: Cigarettes    Quit date: 01/21/1973  . Smokeless tobacco: Never Used     Comment: began smoking in high school, quit age 89  . Alcohol Use: 0.0 oz/week    0 Standard drinks or equivalent per week     Comment: 1-2 per week    Family History  Problem Relation Age of Onset  . Diabetes    . Cancer    . Heart disease Mother     Before age 68  . Diabetes Mother   . Varicose Veins Mother   . Heart attack Mother   . Heart attack Father   . Heart disease Father     After age 2  . Hypertension Father   . Colon cancer Neg Hx   . Heart disease Brother     Before age 49  . Hypertension Brother   . Heart attack Brother   . Diabetes Son   . Hypertension Son   . Hypertension Son   . Diabetes Son    Allergies  Allergen Reactions  . Reclast [Zoledronic Acid] Other (See Comments)    Stopped 2015 due to Creatine     OBJECTIVE: Blood pressure 147/54, pulse 65, temperature 97.9 F (36.6 C), temperature source Oral, resp. rate 16, height 5\' 9"  (1.753 m), weight 205 lb (92.987 kg), SpO2 99 %.  Physical Exam  Constitutional: He is oriented to person, place, and time.  He is currently in the PACU. He is uncomfortable due to pain.  Eyes: Conjunctivae are normal.  Cardiovascular: Normal rate and regular rhythm.   No murmur heard. Pulmonary/Chest: Breath sounds normal.  Left anterior chest pacemaker site appears normal.  Abdominal: Soft. He exhibits no mass. There is no tenderness.  Musculoskeletal:  His left leg is in a brace. Right prosthetic knee looks good.  Neurological: He is alert and oriented to person, place, and time.  Skin: No rash noted.  Psychiatric: Mood and affect normal.    Lab Results Lab Results  Component Value Date   WBC 14.8* 07/29/2015   HGB 12.1* 07/29/2015   HCT 37.7* 07/29/2015   MCV 85.9 07/29/2015   PLT 327 07/29/2015    Lab Results  Component Value Date   CREATININE 2.34* 07/29/2015   BUN 29* 07/29/2015   NA  143 07/29/2015   K 4.3 07/29/2015   CL 106 07/29/2015   CO2 22 07/29/2015    Lab Results  Component Value Date   ALT 114* 07/29/2015   AST 76* 07/29/2015   ALKPHOS 492* 07/29/2015   BILITOT 0.8 07/29/2015     Microbiology: Recent Results (from the past 240 hour(s))  Body fluid culture     Status: None (Preliminary result)   Collection Time: 07/29/15  2:11 PM  Result Value Ref Range Status  Specimen Description SYNOVIAL LEFT KNEE  Final   Special Requests NONE  Final   Gram Stain   Final    FEW WBC PRESENT, PREDOMINANTLY PMN NO ORGANISMS SEEN    Culture PENDING  Incomplete   Report Status PENDING  Incomplete    Michel Bickers, MD [place] [facility] [phone] pager   [phone] cell 07/29/2015, 4:50 PM

## 2015-07-29 NOTE — Op Note (Signed)
NAME:  Dean Murphy, Dean Murphy NO.:  [id number]  MEDICAL RECORD NO.:  [id number]  LOCATION:  5N08C                        FACILITY:  [facility]  PHYSICIAN:  Rod Can, MD     DATE OF BIRTH:  1926-09-22  DATE OF PROCEDURE:  07/29/2015 DATE OF DISCHARGE:                              OPERATIVE REPORT   SURGEON:  Rod Can, MD.  ASSISTANT:  Ky Barban, RNFA.  PREOPERATIVE DIAGNOSIS:  Periprosthetic joint infection, left knee.  POSTOPERATIVE DIAGNOSIS:  Periprosthetic joint infection, left knee.  PROCEDURE PERFORMED:  Debridement of left knee with polyethylene liner exchange.  ANTIBIOTICS: 1. 2 g Ancef. 2. 1 g of vancomycin.  TOURNIQUET TIME:  63 minutes.  SPECIMENS: 1. Left knee synovial fluid for culture. 2. Left knee synovium for tissue culture.  EXPLANT:  Stryker series 2 tibial bearing insert, size 9, 10 mm.  IMPLANT:  Stryker series 2 tibial bearing insert, size 9, 10 mm.  COMPLICATIONS:  None.  DRAINS:  Medium Hemovac x1.  DISPOSITION:  Stable to PACU.  INDICATIONS:  The patient is an 82 year old male, who underwent left total knee replacement 18-19 years ago by Dr. Noemi Chapel.  He was doing very well until about 1 week ago when he had experienced increasing pain and swelling in the left knee.  He came to the office and workup was undertaken.  He had elevated sed rate, C-reactive protein, and serum white blood cell count.  The left knee was aspirated in the office revealing 23,000 white blood cells with 93% neutrophils.  Cultures grew enterococcus sensitive to vancomycin.  Risks, benefits, and alternatives to left knee debridement and polyethylene liner exchange were explained and he elected to proceed.  DESCRIPTION OF PROCEDURE IN DETAIL:  The patient was identified in the holding area using 2 identifiers.  Surgical site was marked by myself. He was taken to the operating room.  Spinal anesthesia was induced.  He was placed on the operating  table.  Placed supine, nonsterile tourniquet was applied to the left thigh.  Bump was placed under the hip, all bony prominences were well padded.  He did receive IV antibiotics within 60 minutes beginning the procedure.  Left lower extremity was prepped and draped in normal sterile surgical fashion.  Time-out was called verifying side and site of surgery.  Gravity was used to exsanguinate the extremity.  Tourniquet was inflated to 300 mmHg.  I utilized his previous incision which I sharply excised with a #10 blade.  I created full-thickness skin flaps.  I came down to the extensor mechanism. Through the extensor mechanism, I inserted an 18-gauge needle and aspirated the left knee.  This purulent fluid was sent for culture.  I made a standard medial parapatellar arthrotomy.  I brought the knee out into extension.  I performed a complete synovectomy of the medial gutter, lateral gutter, and suprapatellar pouch.  The polyethylene liner was removed with an osteotome.  I did send a sample of synovium for tissue culture.  The knee was then flexed up.  I performed a synovectomy of the posterior compartment.  I meticulously debrided the implant-bone interface. All components were stable. The wound was then copiously irrigated  with 9 L of saline.  I trialed 10 mm poly insert which had excellent balance of flexion-extension gaps, and varus-valgus balance.  The real liner was placed.  The knee was then irrigated with a dilute Betadine solution, followed by saline with pulse lavage.  The tourniquet was let down. Meticulous hemostasis was achieved with Aquamantys bipolar sealer.  The arthrotomy was closed over a medium Hemovac drain with #1 PDS suture. Deep dermal layer was closed with 2-0 interrupted Monocryl sutures. Skin was closed with staples.  Sterile dressing was applied.  The patient was then aroused from anesthesia, transferred to PACU in stable condition.  Sponge, needle, and instrument  counts were correct at the end of the case x2.  There were no known complications.  We will admit the patient to the hospital.  I am going to start him on vancomycin.  We will obtain an Infectious Disease consult.  He will need central venous access.  We will place him on Lovenox for DVT prophylaxis and then transition him back to Eliquis once the central line is placed. Surgical events and findings communicated with the family.          ______________________________ Rod Can, MD     BS/MEDQ  D:  07/29/2015  T:  07/29/2015  Job:  [id number]

## 2015-07-29 NOTE — Anesthesia Postprocedure Evaluation (Signed)
Anesthesia Post Note  Patient: Dean Murphy  Procedure(s) Performed: Procedure(s) (LRB): IRRIGATION AND DEBRIDEMENT LEFT KNEE WITH POLY EXCHANGE (Left)  Patient location during evaluation: PACU Anesthesia Type: Spinal Level of consciousness: oriented and awake and alert Pain management: pain level not controlled Vital Signs Assessment: post-procedure vital signs reviewed and stable Respiratory status: spontaneous breathing, respiratory function stable and patient connected to nasal cannula oxygen Cardiovascular status: blood pressure returned to baseline and stable Postop Assessment: no headache and no backache Anesthetic complications: no    Last Vitals:  Filed Vitals:   07/29/15 1700 07/29/15 1715  BP: 140/66 147/49  Pulse: 63 65  Temp:    Resp: 13 16    Last Pain:  Filed Vitals:   07/29/15 1715  PainSc: 10-Worst pain ever                 Effie Berkshire

## 2015-07-30 ENCOUNTER — Encounter (HOSPITAL_COMMUNITY): Payer: Self-pay | Admitting: General Surgery

## 2015-07-30 ENCOUNTER — Inpatient Hospital Stay (HOSPITAL_COMMUNITY): Payer: Medicare Other

## 2015-07-30 DIAGNOSIS — Z950 Presence of cardiac pacemaker: Secondary | ICD-10-CM

## 2015-07-30 DIAGNOSIS — R110 Nausea: Secondary | ICD-10-CM

## 2015-07-30 LAB — CBC
HEMATOCRIT: 28.6 % — AB (ref 39.0–52.0)
HEMOGLOBIN: 9 g/dL — AB (ref 13.0–17.0)
MCH: 26.8 pg (ref 26.0–34.0)
MCHC: 31.5 g/dL (ref 30.0–36.0)
MCV: 85.1 fL (ref 78.0–100.0)
Platelets: 202 10*3/uL (ref 150–400)
RBC: 3.36 MIL/uL — AB (ref 4.22–5.81)
RDW: 18.3 % — ABNORMAL HIGH (ref 11.5–15.5)
WBC: 7.4 10*3/uL (ref 4.0–10.5)

## 2015-07-30 LAB — GLUCOSE, CAPILLARY
GLUCOSE-CAPILLARY: 95 mg/dL (ref 65–99)
GLUCOSE-CAPILLARY: 117 mg/dL — AB (ref 65–99)
GLUCOSE-CAPILLARY: 281 mg/dL — AB (ref 65–99)
Glucose-Capillary: 280 mg/dL — ABNORMAL HIGH (ref 65–99)

## 2015-07-30 LAB — BASIC METABOLIC PANEL
Anion gap: 9 (ref 5–15)
BUN: 29 mg/dL — AB (ref 6–20)
CHLORIDE: 111 mmol/L (ref 101–111)
CO2: 24 mmol/L (ref 22–32)
Calcium: 8.5 mg/dL — ABNORMAL LOW (ref 8.9–10.3)
Creatinine, Ser: 2.13 mg/dL — ABNORMAL HIGH (ref 0.61–1.24)
GFR calc Af Amer: 31 mL/min — ABNORMAL LOW (ref >60)
GFR calc non Af Amer: 26 mL/min — ABNORMAL LOW (ref >60)
GLUCOSE: 113 mg/dL — AB (ref 65–99)
POTASSIUM: 5.5 mmol/L — AB (ref 3.5–5.1)
SODIUM: 144 mmol/L (ref 135–145)

## 2015-07-30 MED ORDER — MIDAZOLAM HCL 2 MG/2ML IJ SOLN
INTRAMUSCULAR | Status: AC | PRN
  Administered 2015-07-30 (×2): 1 mg via INTRAVENOUS

## 2015-07-30 MED ORDER — SODIUM CHLORIDE 0.9% FLUSH: 10.0000 mL | INTRAVENOUS | Status: DC | PRN

## 2015-07-30 MED ORDER — FENTANYL CITRATE (PF) 100 MCG/2ML IJ SOLN
INTRAMUSCULAR | Status: AC | PRN
  Administered 2015-07-30 (×2): 50 ug via INTRAVENOUS

## 2015-07-30 MED ORDER — ONDANSETRON HCL 4 MG/2ML IJ SOLN
INTRAMUSCULAR | Status: AC
  Filled 2015-07-30: qty 2

## 2015-07-30 MED ORDER — CEFAZOLIN SODIUM-DEXTROSE 2-3 GM-% IV SOLR
INTRAVENOUS | Status: AC
Start: 2015-07-30 — End: 2015-07-30
  Administered 2015-07-30: 2000 mg
  Filled 2015-07-30: qty 50

## 2015-07-30 MED ORDER — DILTIAZEM HCL ER COATED BEADS 180 MG PO CP24
360.0000 mg | ORAL_CAPSULE | Freq: Every day | ORAL | Status: DC
  Administered 2015-07-30: 360 mg via ORAL
  Filled 2015-07-30 (×2): qty 2

## 2015-07-30 MED ORDER — SODIUM CHLORIDE 0.9% FLUSH: 10.0000 mL | Freq: Two times a day (BID) | INTRAVENOUS | Status: DC

## 2015-07-30 MED ORDER — APIXABAN 5 MG PO TABS
5.0000 mg | ORAL_TABLET | Freq: Two times a day (BID) | ORAL | Status: DC
  Administered 2015-07-30: 5 mg via ORAL
  Filled 2015-07-30: qty 1

## 2015-07-30 MED ORDER — LIDOCAINE HCL 1 % IJ SOLN
INTRAMUSCULAR | Status: AC
  Filled 2015-07-30: qty 20

## 2015-07-30 MED ORDER — ONDANSETRON HCL 4 MG/2ML IJ SOLN
INTRAMUSCULAR | Status: AC | PRN
  Administered 2015-07-30: 4 mg via INTRAVENOUS

## 2015-07-30 MED ORDER — FENTANYL CITRATE (PF) 100 MCG/2ML IJ SOLN
INTRAMUSCULAR | Status: AC
  Filled 2015-07-30: qty 2

## 2015-07-30 MED ORDER — MIDAZOLAM HCL 2 MG/2ML IJ SOLN
INTRAMUSCULAR | Status: AC
  Filled 2015-07-30: qty 2

## 2015-07-30 NOTE — Care Management Note (Signed)
Case Management Note  Patient Details  Name: Dean Murphy MRN: [id number] Date of Birth: 1926-07-03  Subjective/Objective:   82 yr old male admitted with periprosthetic joint infection. S/p I & D with polyethylene linear exchange                 Action/Plan: Case manager spoke with patient's son's concerning discharge plan and DME needs. Patient was active with [facility] for home health therapy, they will resume care and provide RN for IV line care and antibiotics. [facility] will provide the IV Vanc for 6 weeks.  Patient will have family support at discharge.  Expected Discharge Date:    07/31/15              Expected Discharge Plan:   Home with Home Health  In-House Referral:     Discharge planning Services  CM Consult  Post Acute Care Choice:  Home Health Choice offered to:  Patient  DME Arranged:  N/A DME Agency:  NA  HH Arranged:  RN, PT, IV Antibiotics HH Agency:  [place]  Status of Service:  completed Medicare Important Message Given:    Date Medicare IM Given:    Medicare IM give by:    Date Additional Medicare IM Given:    Additional Medicare Important Message give by:     If discussed at [place] of Stay Meetings, dates discussed:    Additional Comments:  Ninfa Meeker, RN 07/30/2015, 11:41 AM

## 2015-07-30 NOTE — Evaluation (Signed)
Occupational Therapy Evaluation Patient Details Name: Dean Murphy MRN: [id number] DOB: [birth year] Today's Date: 07/30/2015    History of Present Illness Dean Murphy is a 82 y.o. male who presents with JOINT INFECTION LEFT KNEE . H/o left TKA 18-19 years ago by Dr. Noemi Chapel. Has about one week of left knee pain and swelling. Pt s/p L knee I & D (07/29/15). PMH: CABG, DM, pacemaker   Clinical Impression   Patient presenting with decreased I in self care, decreased balance, decreased functional transfers, increased pain, and decreased activity tolerance.  Patient reports being mod I PTA. Patient currently functioning at min - mod overall. Patient will benefit from acute OT to increase overall independence in the areas of ADLs, functional mobility, and safety  in order to safely discharge home.     Follow Up Recommendations  No OT follow up;Supervision - Intermittent    Equipment Recommendations  Tub/shower bench    Recommendations for Other Services       Precautions / Restrictions Precautions Precautions: Fall;None Restrictions Weight Bearing Restrictions: Yes LLE Weight Bearing: Weight bearing as tolerated      Mobility Bed Mobility Overal bed mobility: Needs Assistance Bed Mobility: Supine to Sit     Supine to sit: Min guard;HOB elevated     General bed mobility comments: Pt able to come to R side of the bed using R LE to hook under L LE and move it to EOB.  Transfers Overall transfer level: Needs assistance Equipment used: Rolling walker (2 wheeled) Transfers: Sit to/from Stand Sit to Stand: Min guard              Balance Overall balance assessment: Needs assistance Sitting-balance support: Feet supported;No upper extremity supported Sitting balance-Leahy Scale: Good     Standing balance support: Bilateral upper extremity supported Standing balance-Leahy Scale: Fair Standing balance comment: utilized RW          ADL Overall ADL's : Needs  assistance/impaired         Upper Body Bathing: Set up;Sitting   Lower Body Bathing: Moderate assistance;Sit to/from stand   Upper Body Dressing : Set up;Sitting   Lower Body Dressing: Moderate assistance;Sit to/from stand   Toilet Transfer: Comfort height toilet;RW;Min guard   Toileting- Water quality scientist and Hygiene: Min guard;Sit to/from stand         General ADL Comments: Pt performed supine >sit with steady assistance for balance. Pt ambulated with RW and min guard 10' into bathroom for toilet transfer. Pt performing clothing management with min guard. Pt remaining on toilet attempting to have BM with son present in room. RN notified.                Pertinent Vitals/Pain Pain Assessment: 0-10 Pain Score: 4  Pain Location: L knee Pain Descriptors / Indicators: Aching;Sore Pain Intervention(s): Monitored during session     Hand Dominance Right   Extremity/Trunk Assessment Upper Extremity Assessment Upper Extremity Assessment: Overall WFL for tasks assessed   Lower Extremity Assessment Lower Extremity Assessment: Defer to PT evaluation   Cervical / Trunk Assessment Cervical / Trunk Assessment: Normal   Communication Communication Communication: No difficulties   Cognition Arousal/Alertness: Awake/alert Behavior During Therapy: WFL for tasks assessed/performed Overall Cognitive Status: Within Functional Limits for tasks assessed                                Home Living Family/patient expects to be discharged to:: Private  residence Living Arrangements: Spouse/significant other Available Help at Discharge: Family;Available 24 hours/day Type of Home: House Home Access: Stairs to enter [employer] of Steps: 2 Entrance Stairs-Rails: Left;None Home Layout: One level     Bathroom Shower/Tub: Teacher, early years/pre: Handicapped height     Home Equipment: Environmental consultant - 2 wheels;Bedside commode;Crutches           Prior Functioning/Environment Level of Independence: Independent        Comments: Independent until ~ 1 week ago when pain in L knee started and had to use crutches     OT Diagnosis: Acute pain   OT Problem List: Impaired balance (sitting and/or standing);Decreased activity tolerance;Decreased safety awareness;Pain;Decreased coordination;Decreased strength;Decreased knowledge of use of DME or AE   OT Treatment/Interventions: Self-care/ADL training;Therapeutic exercise;Balance training;Therapeutic activities;Energy conservation;DME and/or AE instruction;Patient/family education    OT Goals(Current goals can be found in the care plan section) Acute Rehab OT Goals Patient Stated Goal: to go home and decrease pain OT Goal Formulation: With patient Time For Goal Achievement: 08/13/15 Potential to Achieve Goals: Good ADL Goals Pt Will Perform Lower Body Bathing: with modified independence;sit to/from stand Pt Will Perform Lower Body Dressing: with modified independence;with adaptive equipment;sit to/from stand Pt Will Transfer to Toilet: with modified independence;ambulating Pt Will Perform Toileting - Clothing Manipulation and hygiene: with modified independence;sit to/from stand Pt Will Perform Tub/Shower Transfer: with supervision;tub bench;rolling walker;ambulating  OT Frequency: Min 2X/week   Barriers to D/C: Other (comment) (none known at this time)             End of Session Equipment Utilized During Treatment: Surveyor, mining Communication: Mobility status;Other (comment) (notified that pt is on toilet)  Activity Tolerance: Patient tolerated treatment well Patient left: with family/visitor present;Other (comment) (in bathroom on commode.)   Time: 1543-1600 OT Time Calculation (min): 17 min Charges:  OT General Charges $OT Visit: 1 Procedure OT Evaluation $OT Eval Low Complexity: 1 Procedure G-Codes:    Phineas Semen, MS, OTR/L 07/30/2015, 4:08 PM

## 2015-07-30 NOTE — Evaluation (Signed)
Physical Therapy Evaluation Patient Details Name: Dean Murphy MRN: [id number] DOB: January 09, 1927 Today's Date: 07/30/2015   History of Present Illness  Dean Murphy is a 82 y.o. male who presents with JOINT INFECTION LEFT KNEE . H/o left TKA 18-19 years ago by Dr. Noemi Chapel. Has about one week of left knee pain and swelling. Pt s/p L knee I & D (07/29/15). PMH: CABG, DM, pacemaker  Clinical Impression  Pt admitted with above diagnosis. Pt currently with functional limitations due to the deficits listed below (see PT Problem List).  Pt will benefit from skilled PT to increase their independence and safety with mobility to allow discharge to the venue listed below. Pt had been NPO this AM and unable to have pain meds since early this AM, but pt agreeable to PT eval.  Therex / L knee ROM limited by pain, but able to ambulate around foot of bed to recliner on other side.  He should progress well especially when able to return to pain medication schedule. He has all DME, but would benefit from HHPT.    Follow Up Recommendations Home health PT    Equipment Recommendations  None recommended by PT    Recommendations for Other Services       Precautions / Restrictions Precautions Precautions: Fall Restrictions Weight Bearing Restrictions: Yes LLE Weight Bearing: Weight bearing as tolerated      Mobility  Bed Mobility Overal bed mobility: Needs Assistance Bed Mobility: Supine to Sit     Supine to sit: Min guard;HOB elevated     General bed mobility comments: Pt able to come to R side of the bed using R LE to hook under L LE and move it to EOB.  Transfers Overall transfer level: Needs assistance Equipment used: Rolling walker (2 wheeled) Transfers: Sit to/from Stand Sit to Stand: Min guard         General transfer comment: min/guard for steadying.  Pt needed to stand for about 2 minutes before beginning ambulation. Very little L knee flexion with sit <>  stand  Ambulation/Gait Ambulation/Gait assistance: Min guard Ambulation Distance (Feet): 10 Feet Assistive device: Rolling walker (2 wheeled) Gait Pattern/deviations: Step-to pattern;Antalgic;Decreased stance time - left     General Gait Details: Antalgic gait but able to WB on L LE and even stopped mid gait to stand on B LE without UE support.  LIttle knee ROM during any phases of gait.  Stairs            Wheelchair Mobility    Modified Rankin (Stroke Patients Only)       Balance Overall balance assessment: Needs assistance   Sitting balance-Leahy Scale: Good       Standing balance-Leahy Scale: Fair                               Pertinent Vitals/Pain Pain Assessment: 0-10 Pain Score: 7  Pain Location: L knee/quad Pain Descriptors / Indicators: Aching;Burning Pain Intervention(s): Monitored during session;Ice applied    Home Living Family/patient expects to be discharged to:: Private residence Living Arrangements: Spouse/significant other Available Help at Discharge: Family;Available 24 hours/day Type of Home: House Home Access: Stairs to enter Entrance Stairs-Rails: Left Entrance Stairs-Number of Steps: 2 Home Layout: One level Home Equipment: Walker - 2 wheels;Bedside commode;Crutches      Prior Function Level of Independence: Independent         Comments: Independent until ~ 1 week ago when pain  in L knee started and had to use crutches      Hand Dominance   Dominant Hand: Right    Extremity/Trunk Assessment   Upper Extremity Assessment: Defer to OT evaluation           Lower Extremity Assessment: LLE deficits/detail   LLE Deficits / Details: Limited L knee ROM. Fair quad set  Cervical / Trunk Assessment: Normal  Communication   Communication: No difficulties  Cognition Arousal/Alertness: Awake/alert Behavior During Therapy: WFL for tasks assessed/performed Overall Cognitive Status: Within Functional Limits for tasks  assessed                      General Comments General comments (skin integrity, edema, etc.): L knee with extra bandage on L side due to increased drainage when drain was pulled earlier.  Pt awaiting IR for central line and not able to have pain meds and NPO    Exercises General Exercises - Lower Extremity Ankle Circles/Pumps: AROM;Both Quad Sets: Left;5 reps Heel Slides: AAROM;Left (2 reps only due to pain)      Assessment/Plan    PT Assessment Patient needs continued PT services  PT Diagnosis Difficulty walking;Acute pain   PT Problem List Decreased strength;Decreased range of motion;Decreased activity tolerance;Decreased balance;Decreased mobility  PT Treatment Interventions DME instruction;Gait training;Stair training;Functional mobility training;Therapeutic activities;Therapeutic exercise;Balance training   PT Goals (Current goals can be found in the Care Plan section) Acute Rehab PT Goals Patient Stated Goal: decrease pain PT Goal Formulation: With patient/family Time For Goal Achievement: 08/13/15 Potential to Achieve Goals: Good    Frequency Min 5X/week   Barriers to discharge        Co-evaluation               End of Session Equipment Utilized During Treatment: Gait belt Activity Tolerance: Patient limited by pain Patient left: in chair;with call bell/phone within reach;with family/visitor present Nurse Communication: Mobility status         Time: [id number] PT Time Calculation (min) (ACUTE ONLY): 31 min   Charges:   PT Evaluation $PT Eval Moderate Complexity: 1 Procedure PT Treatments $Gait Training: 8-22 mins   PT G Codes:        Alya Smaltz LUBECK 07/30/2015, 11:09 AM

## 2015-07-30 NOTE — Procedures (Signed)
R IJ tunneled CVC  No complication No blood loss. See complete dictation in [facility].

## 2015-07-30 NOTE — Progress Notes (Signed)
OT Cancellation Note  Patient Details Name: HAIDON CHIRIBOGA MRN: [id number] DOB: May 30, 1926   Cancelled Treatment:    Reason Eval/Treat Not Completed: Patient at procedure or test/ unavailable. Will check back as able for OT evaluation. Thank you for the order.   Chrys Racer , MS, OTR/L, CLT Pager: [phone]  07/30/2015, 11:01 AM

## 2015-07-30 NOTE — Progress Notes (Signed)
   Subjective:  Patient reports pain as mild to moderate.  No c/o.  Objective:   VITALS:   Filed Vitals:   07/29/15 2004 07/29/15 2109 07/30/15 0150 07/30/15 0551  BP:  123/51 128/56 130/46  Pulse:  61 60 70  Temp:  97.6 F (36.4 C) 98.8 F (37.1 C) 98 F (36.7 C)  TempSrc:  Oral Oral Oral  Resp:  17 17 16   Height:      Weight:      SpO2: 97% 98% 99% 97%    ABD soft Sensation intact distally Intact pulses distally Dorsiflexion/Plantar flexion intact Incision: dressing C/D/I Compartment soft HV scant ss drainage  Lab Results  Component Value Date   WBC 7.4 07/30/2015   HGB 9.0* 07/30/2015   HCT 28.6* 07/30/2015   MCV 85.1 07/30/2015   PLT 202 07/30/2015   BMET    Component Value Date/Time   NA 144 07/30/2015 0538   K 5.5* 07/30/2015 0538   CL 111 07/30/2015 0538   CO2 24 07/30/2015 0538   GLUCOSE 113* 07/30/2015 0538   GLUCOSE 120* 04/28/2006 0917   BUN 29* 07/30/2015 0538   CREATININE 2.13* 07/30/2015 0538   CALCIUM 8.5* 07/30/2015 0538   GFRNONAA 26* 07/30/2015 0538   GFRAA 31* 07/30/2015 0538     Assessment/Plan: 1 Day Post-Op   Principal Problem:   Infection of prosthetic left knee joint (HCC) Active Problems:   DM (diabetes mellitus), type 2 with renal complications (HCC)   Dyslipidemia   Essential hypertension   Osteoarthritis   PVD- s/p bilat CEA- CA dopplers OK Jan 2015   Complete heart block (HCC)   Acute on chronic renal insufficiency (HCC)   S/P CABG x 5 1989. Low risk Myoview 2012   Atrial fibrillation-CHADS VASC2 = 5 (age, HTN, DM, Vasc)   Anemia   Pacemaker   Stroke [facility])   Coronary artery disease    WBAT with walker DVT ppx: hold chemical DVT ppx for now, resume eliquis after central line placement Enterococcus PJI: ID saw patient yesterday, cont vanco CKD Stage IV: Cr stable DM II: strict glucose control PT/OT PO pain control Acute blood loss on chronic anemia: stable, monitor NPO for now pending IR procedure D/C HV  drain Dispo: needs central line, will require 6 weeks IV vancomycin, d/c home tomorrow vs Wed    Elwyn Lowden, Terrelle Slingerland 07/30/2015, 7:38 AM   Rod Can, MD Cell [phone]

## 2015-07-30 NOTE — Progress Notes (Signed)
Patient ID: OTT ESSE, male   DOB: [birth year], 82 y.o.   MRN: [id number]         [facility] for Infectious [facility]    Date of Admission:  07/29/2015           Day 1 vancomycin  Principal Problem:   Infection of prosthetic left knee joint (HCC) Active Problems:   DM (diabetes mellitus), type 2 with renal complications ([place])   Dyslipidemia   Essential hypertension   Osteoarthritis   PVD- s/p bilat CEA- CA dopplers [place] Jan 2015   Complete heart block (HCC)   Acute on chronic renal insufficiency (HCC)   S/P CABG x 5 1989. Low risk Myoview 2012   Atrial fibrillation-CHADS VASC2 = 5 (age, HTN, DM, Vasc)   Anemia   Pacemaker   Stroke [facility]   Coronary artery disease   . benazepril  20 mg Oral Daily  . dexamethasone  10 mg Intravenous Once  . diltiazem  360 mg Oral Daily  . docusate sodium  100 mg Oral BID  . glipiZIDE  10 mg Oral QAC breakfast  . glipiZIDE  5 mg Oral QAC supper  . insulin glargine  10 Units Subcutaneous QHS  . linagliptin  5 mg Oral Daily  . lubiprostone  8 mcg Oral Q breakfast  . pantoprazole  80 mg Oral Daily  . polyvinyl alcohol  1 drop Both Eyes BID  . rosuvastatin  20 mg Oral Daily  . senna  2 tablet Oral QHS  . vancomycin  1,000 mg Intravenous Q24H    SUBJECTIVE: He states that his left knee pain last night was "rough". He is a little nauseated this morning.  Review of Systems: Review of Systems  Constitutional: Positive for malaise/fatigue. Negative for fever, chills, weight loss and diaphoresis.  HENT: Negative for sore throat.   Respiratory: Negative for cough, sputum production and shortness of breath.   Cardiovascular: Negative for chest pain.  Gastrointestinal: Positive for nausea. Negative for vomiting, abdominal pain and diarrhea.  Musculoskeletal: Positive for joint pain. Negative for myalgias.  Skin: Negative for rash.  Neurological: Negative for headaches.    Past Medical History  Diagnosis Date  . GERD (gastroesophageal  reflux disease)   . Hyperlipidemia   . Essential hypertension   . Arthritis   . Diverticulosis   . Hemorrhoids   . Colon polyps   . CAD (coronary artery disease)     a. CABG 1989, b. Myoview low risk 2012.  [name] CHB (complete heart block) [facility]) March 2016    a. s/p STJ dual chamber pacemaker 07/2014.  . Osteoporosis   . PAF (paroxysmal atrial fibrillation) [facility]) March 2016  . Chronic diastolic CHF (congestive heart failure) ([place])     a. Dx [id number] - acute diastolic CHF in the setting of CHB.  . Carotid artery occlusion     a. Duplex 10/2014: patent R/L CEA with mild hyperplasia in right surgical bulb - followed by vascular.  . Myocardial infarction [facility]) 1976  and Mar. 19, 2016  . CVA (cerebral infarction)   . Orthostasis   . Anemia   . Complication of anesthesia   . PONV (postoperative nausea and vomiting)   . Presence of permanent cardiac pacemaker   . Shortness of breath dyspnea     due to pain  . Diabetes mellitus     type II  . Chronic kidney disease, stage IV (severe) (HCC)     stage III/IV as of 2015, [name]  .  CKD (chronic kidney disease), stage IV ([place])   . Constipation   . Dupuytren's disease     finger right hand contracted    Social History  Substance Use Topics  . Smoking status: Former Smoker -- 1.00 packs/day for 25 years    Types: Cigarettes    Quit date: 01/21/1973  . Smokeless tobacco: Never Used     Comment: began smoking in high school, quit age 61  . Alcohol Use: 0.0 oz/week    0 Standard drinks or equivalent per week     Comment: 1-2 per week    Family History  Problem Relation Age of Onset  . Diabetes    . Cancer    . Heart disease Mother     Before age 77  . Diabetes Mother   . Varicose Veins Mother   . Heart attack Mother   . Heart attack Father   . Heart disease Father     After age 63  . Hypertension Father   . Colon cancer Neg Hx   . Heart disease Brother     Before age 70  . Hypertension Brother   . Heart attack  Brother   . Diabetes Son   . Hypertension Son   . Hypertension Son   . Diabetes Son    Allergies  Allergen Reactions  . Reclast [Zoledronic Acid] Other (See Comments)    Stopped 2015 due to Creatine     OBJECTIVE: Filed Vitals:   07/29/15 2004 07/29/15 2109 07/30/15 0150 07/30/15 0551  BP:  123/51 128/56 130/46  Pulse:  61 60 70  Temp:  97.6 F (36.4 C) 98.8 F (37.1 C) 98 F (36.7 C)  TempSrc:  Oral Oral Oral  Resp:  17 17 16   Height:      Weight:      SpO2: 97% 98% 99% 97%   Body mass index is 30.26 kg/(m^2).  Physical Exam  Constitutional: He is oriented to person, place, and time.  He is alert and smiling. He is in good spirits.  Cardiovascular: Normal rate and regular rhythm.   No murmur heard. Pulmonary/Chest: Breath sounds normal.  Left anterior chest pacemaker site appears normal.  Abdominal: Soft. There is no tenderness.  Musculoskeletal:  His left knee drain has been pulled.  Neurological: He is alert and oriented to person, place, and time.  Skin: No rash noted.  Psychiatric: Mood and affect normal.    Lab Results Lab Results  Component Value Date   WBC 7.4 07/30/2015   HGB 9.0* 07/30/2015   HCT 28.6* 07/30/2015   MCV 85.1 07/30/2015   PLT 202 07/30/2015    Lab Results  Component Value Date   CREATININE 2.13* 07/30/2015   BUN 29* 07/30/2015   NA 144 07/30/2015   K 5.5* 07/30/2015   CL 111 07/30/2015   CO2 24 07/30/2015    Lab Results  Component Value Date   ALT 114* 07/29/2015   AST 76* 07/29/2015   ALKPHOS 492* 07/29/2015   BILITOT 0.8 07/29/2015     Microbiology: Recent Results (from the past 240 hour(s))  Body fluid culture     Status: None (Preliminary result)   Collection Time: 07/29/15  2:11 PM  Result Value Ref Range Status   Specimen Description SYNOVIAL LEFT KNEE  Final   Special Requests NONE  Final   Gram Stain   Final    FEW WBC PRESENT, PREDOMINANTLY PMN NO ORGANISMS SEEN    Culture PENDING  Incomplete  Report  Status PENDING  Incomplete  Tissue culture     Status: None (Preliminary result)   Collection Time: 07/29/15  2:11 PM  Result Value Ref Range Status   Specimen Description SYNOVIAL LEFT KNEE  Final   Special Requests NONE  Final   Gram Stain   Final    NO WBC SEEN NO ORGANISMS SEEN Performed at [facility]    Culture PENDING  Incomplete   Report Status PENDING  Incomplete     ASSESSMENT: Outpatient cultures grew enterococcus. Operative cultures are pending. He is scheduled for a central IV today. I will plan on 6 weeks of IV vancomycin through 09/10/2015 before switching to oral amoxicillin.  PLAN: 1. Continue vancomycin pending operative culture results 2. Central line placement  Michel Bickers, [facility] for Infectious [place] [phone] pager   [phone] cell 07/30/2015, 9:30 AM

## 2015-07-30 NOTE — Progress Notes (Signed)
Notified dr swinteck of lab culture, gram - cocci, no new orders at this time. Pt asymptomatic, no obvious complications noted

## 2015-07-30 NOTE — Progress Notes (Signed)
[facility]  Patient Status: New pt for [facility] this admission  [facility] is providing the following services: HHRN provided by [facility].  [facility] will partner with [facility] to provide IV ABX at home for Mr. Gabor.   If patient discharges after hours, please call [phone].   Larry Sierras 07/30/2015, 10:12 PM

## 2015-07-30 NOTE — Consult Note (Signed)
Chief Complaint: infected left total knee join  Referring Physician:Dr. Rod Can  Supervising Physician: Arne Cleveland  HPI: Dean Murphy is an 82 y.o. male who presented with left sided knee pain.  He has a history of a TKA 18 years ago by Dr. Noemi Chapel.  He had about a week of pain and swelling. This was aspirated in the office, start on abx therapy, and sent over for I&D, which he has had done.  He will be in need of long term abx therapy and we have been asked to place a tunneled perm cath.  Past Medical History:  Past Medical History  Diagnosis Date  . GERD (gastroesophageal reflux disease)   . Hyperlipidemia   . Essential hypertension   . Arthritis   . Diverticulosis   . Hemorrhoids   . Colon polyps   . CAD (coronary artery disease)     a. CABG 1989, b. Myoview low risk 2012.  [name] CHB (complete heart block) [facility]) March 2016    a. s/p STJ dual chamber pacemaker 07/2014.  . Osteoporosis   . PAF (paroxysmal atrial fibrillation) [facility] March 2016  . Chronic diastolic CHF (congestive heart failure) ([place])     a. Dx 0/0174 - acute diastolic CHF in the setting of CHB.  . Carotid artery occlusion     a. Duplex 10/2014: patent R/L CEA with mild hyperplasia in right surgical bulb - followed by vascular.  . Myocardial infarction [facility]) 1976  and Mar. 19, 2016  . CVA (cerebral infarction)   . Orthostasis   . Anemia   . Complication of anesthesia   . PONV (postoperative nausea and vomiting)   . Presence of permanent cardiac pacemaker   . Shortness of breath dyspnea     due to pain  . Diabetes mellitus     type II  . Chronic kidney disease, stage IV (severe) (HCC)     stage III/IV as of 2015, [name]  . CKD (chronic kidney disease), stage IV ([place])   . Constipation   . Dupuytren's disease     finger right hand contracted    Past Surgical History:  Past Surgical History  Procedure Laterality Date  . Coronary artery bypass graft  1989  . Cholecystectomy    .  Total knee arthroplasty      bilateral  . Tonsillectomy    . Carotid endarterectomy  12/31/10    Right  . Carotid endarterectomy  02/09/11    left  . Joint replacement      bilat. knees  . Cardiac catheterization  08/11/2014    Procedure: TEMPORARY PACEMAKER;  Surgeon: [name], MD;  Location: [facility] CATH LAB;  Service: Cardiovascular;;  . Permanent pacemaker insertion N/A 08/13/2014    STJ dual chamber pacemaker implanted by [name] for CHB  . Eye surgery Bilateral     Cataract with implants    Family History:  Family History  Problem Relation Age of Onset  . Diabetes    . Cancer    . Heart disease Mother     Before age 14  . Diabetes Mother   . Varicose Veins Mother   . Heart attack Mother   . Heart attack Father   . Heart disease Father     After age 66  . Hypertension Father   . Colon cancer Neg Hx   . Heart disease Brother     Before age 17  . Hypertension Brother   . Heart  attack Brother   . Diabetes Son   . Hypertension Son   . Hypertension Son   . Diabetes Son     Social History:  reports that he quit smoking about 42 years ago. His smoking use included Cigarettes. He has a 25 pack-year smoking history. He has never used smokeless tobacco. He reports that he drinks alcohol. He reports that he does not use illicit drugs.  Allergies:  Allergies  Allergen Reactions  . Reclast [Zoledronic Acid] Other (See Comments)    Stopped 2015 due to Creatine     Medications:   Medication List    ASK your doctor about these medications        apixaban 2.5 MG Tabs tablet  Commonly known as:  ELIQUIS  Take 1 tablet (2.5 mg total) by mouth 2 (two) times daily.     benazepril 20 MG tablet  Commonly known as:  LOTENSIN  Take 1 tablet (20 mg total) by mouth daily.     diltiazem 360 MG 24 hr capsule  Commonly known as:  TIAZAC  Take 1 capsule (360 mg total) by mouth daily.     doxycycline 100 MG EC tablet  Commonly known as:  DORYX  Take 100 mg by mouth 2 (two)  times daily. For 7 day patient started this Thursday night     EYE WASH 99.05 % Soln  Place 1 drop into both eyes 2 (two) times daily.     ferrous sulfate 325 (65 FE) MG tablet  Commonly known as:  [facility] IRON  Take 1 tablet (325 mg total) by mouth 2 (two) times daily with a meal.     furosemide 40 MG tablet  Commonly known as:  LASIX  Take 1 tablet (40 mg total) by mouth daily as needed.     glipiZIDE 5 MG tablet  Commonly known as:  GLUCOTROL  Take 2 tablets (10 mg total) by mouth daily before breakfast. And 1 tab before supper     HYDROcodone-acetaminophen 5-325 MG tablet  Commonly known as:  NORCO/VICODIN  Take 2 tablets by mouth every 6 (six) hours as needed for moderate pain.     Insulin Glargine 100 UNIT/ML Solostar Pen  Commonly known as:  LANTUS SOLOSTAR  Inject 5-10 Units into the skin daily at 10 pm.     Insulin Pen Needle 31G X 5 MM Misc  Use daily with insulin pen     lubiprostone 8 MCG capsule  Commonly known as:  AMITIZA  TAKE 1 CAPSULE BY MOUTH  DAILY WITH  BREAKFAST     NON FORMULARY  daily. careconcepts lancets an d glucometer strips     omeprazole 40 MG capsule  Commonly known as:  PRILOSEC  Take 1 capsule (40 mg total) by mouth daily.     rosuvastatin 20 MG tablet  Commonly known as:  CRESTOR  Take 1 tablet (20 mg total) by mouth daily.     sitaGLIPtin 50 MG tablet  Commonly known as:  JANUVIA  Take 1 tablet (50 mg total) by mouth daily.        Please HPI for pertinent positives, otherwise complete 10 system ROS negative.  Mallampati Score: MD Evaluation Airway: WNL Heart: WNL Abdomen: WNL Chest/ Lungs: WNL ASA  Classification: 3 Mallampati/Airway Score: One  Physical Exam: BP 165/67 mmHg  Pulse 84  Temp(Src) 98 F (36.7 C) (Oral)  Resp 15  Ht '5\' 9"'$  (1.753 m)  Wt 205 lb (92.987 kg)  BMI 30.26 kg/m2  SpO2  91% Body mass index is 30.26 kg/(m^2). General: pleasant, WD, WN white male who is laying in bed in mild distress secondary to  pain HEENT: head is normocephalic, atraumatic.  Sclera are noninjected.  PERRL.  Ears and nose without any masses or lesions.  Mouth is pink and moist Heart: regular, rate, and rhythm.  Normal s1,s2. No obvious murmurs, gallops, or rubs noted.  Palpable radial and pedal pulses bilaterally Lungs: CTAB, no wheezes, rhonchi, or rales noted.  Respiratory effort nonlabored MS: all 4 extremities are symmetrical with no cyanosis, clubbing, or edema.  Left knee has bandage in place, incision is not visualized.  Right leg with TED hose in place Skin: warm and dry with no masses, lesions, or rashes Psych: A&Ox3 with an appropriate affect.   Labs: Results for orders placed or performed during the hospital encounter of 07/29/15 (from the past 48 hour(s))  Type and screen Order type and screen if day of surgery is less than 15 days from draw of preadmission visit or order morning of surgery if day of surgery is greater than 6 days from preadmission visit.     Status: None   Collection Time: 07/29/15 10:51 AM  Result Value Ref Range   ABO/RH(D) AB POS    Antibody Screen NEG    Sample Expiration 08/01/2015   Glucose, capillary     Status: Abnormal   Collection Time: 07/29/15 11:02 AM  Result Value Ref Range   Glucose-Capillary 146 (H) 65 - 99 mg/dL  C-reactive protein     Status: Abnormal   Collection Time: 07/29/15 11:04 AM  Result Value Ref Range   CRP 20.4 (H) <1.0 mg/dL  Sedimentation rate     Status: Abnormal   Collection Time: 07/29/15 11:04 AM  Result Value Ref Range   Sed Rate 85 (H) 0 - 16 mm/hr  APTT     Status: None   Collection Time: 07/29/15 11:04 AM  Result Value Ref Range   aPTT 30 24 - 37 seconds  CBC WITH DIFFERENTIAL     Status: Abnormal   Collection Time: 07/29/15 11:04 AM  Result Value Ref Range   WBC 14.8 (H) 4.0 - 10.5 K/uL   RBC 4.39 4.22 - 5.81 MIL/uL   Hemoglobin 12.1 (L) 13.0 - 17.0 g/dL   HCT 26.7 (L) 05.1 - 86.3 %   MCV 85.9 78.0 - 100.0 fL   MCH 27.6 26.0 - 34.0  pg   MCHC 32.1 30.0 - 36.0 g/dL   RDW 63.5 (H) 66.8 - 53.9 %   Platelets 327 150 - 400 K/uL   Neutrophils Relative % 82 %   Lymphocytes Relative 13 %   Monocytes Relative 5 %   Eosinophils Relative 0 %   Basophils Relative 0 %   Neutro Abs 12.2 (H) 1.7 - 7.7 K/uL   Lymphs Abs 1.9 0.7 - 4.0 K/uL   Monocytes Absolute 0.7 0.1 - 1.0 K/uL   Eosinophils Absolute 0.0 0.0 - 0.7 K/uL   Basophils Absolute 0.0 0.0 - 0.1 K/uL   Smear Review MORPHOLOGY UNREMARKABLE   Comprehensive metabolic panel     Status: Abnormal   Collection Time: 07/29/15 11:04 AM  Result Value Ref Range   Sodium 143 135 - 145 mmol/L   Potassium 4.3 3.5 - 5.1 mmol/L   Chloride 106 101 - 111 mmol/L   CO2 22 22 - 32 mmol/L   Glucose, Bld 167 (H) 65 - 99 mg/dL   BUN 29 (H) 6 - 20 mg/dL  Creatinine, Ser 2.34 (H) 0.61 - 1.24 mg/dL   Calcium 9.6 8.9 - 81.4 mg/dL   Total Protein 7.2 6.5 - 8.1 g/dL   Albumin 2.8 (L) 3.5 - 5.0 g/dL   AST 76 (H) 15 - 41 U/L   ALT 114 (H) 17 - 63 U/L   Alkaline Phosphatase 492 (H) 38 - 126 U/L   Total Bilirubin 0.8 0.3 - 1.2 mg/dL   GFR calc non Af Amer 24 (L) >60 mL/min   GFR calc Af Amer 27 (L) >60 mL/min    Comment: (NOTE) The eGFR has been calculated using the CKD EPI equation. This calculation has not been validated in all clinical situations. eGFR's persistently <60 mL/min signify possible Chronic Kidney Disease.    Anion gap 15 5 - 15  Protime-INR     Status: Abnormal   Collection Time: 07/29/15 11:04 AM  Result Value Ref Range   Prothrombin Time 15.5 (H) 11.6 - 15.2 seconds   INR 1.22 0.00 - 1.49  Urinalysis, Routine w reflex microscopic (not at [facility])     Status: Abnormal   Collection Time: 07/29/15 11:06 AM  Result Value Ref Range   Color, Urine YELLOW YELLOW   APPearance CLOUDY (A) CLEAR   Specific Gravity, Urine 1.025 1.005 - 1.030   pH 5.0 5.0 - 8.0   Glucose, UA NEGATIVE NEGATIVE mg/dL   Hgb urine dipstick MODERATE (A) NEGATIVE   Bilirubin Urine NEGATIVE NEGATIVE    Ketones, ur NEGATIVE NEGATIVE mg/dL   Protein, ur 481 (A) NEGATIVE mg/dL   Nitrite NEGATIVE NEGATIVE   Leukocytes, UA NEGATIVE NEGATIVE  Urine microscopic-add on     Status: Abnormal   Collection Time: 07/29/15 11:06 AM  Result Value Ref Range   Squamous Epithelial / LPF 0-5 (A) NONE SEEN   WBC, UA 0-5 0 - 5 WBC/hpf   RBC / HPF 0-5 0 - 5 RBC/hpf   Bacteria, UA FEW (A) NONE SEEN   Casts GRANULAR CAST (A) NEGATIVE   Crystals URIC ACID CRYSTALS (A) NEGATIVE   Urine-Other AMORPHOUS URATES/PHOSPHATES   Anaerobic culture     Status: None (Preliminary result)   Collection Time: 07/29/15  2:11 PM  Result Value Ref Range   Specimen Description SYNOVIAL LEFT KNEE    Special Requests NONE    Gram Stain      FEW WBC PRESENT, PREDOMINANTLY PMN NO ORGANISMS SEEN    Culture PENDING    Report Status PENDING   Body fluid culture     Status: None (Preliminary result)   Collection Time: 07/29/15  2:11 PM  Result Value Ref Range   Specimen Description SYNOVIAL LEFT KNEE    Special Requests NONE    Gram Stain      FEW WBC PRESENT, PREDOMINANTLY PMN NO ORGANISMS SEEN    Culture PENDING    Report Status PENDING   Tissue culture     Status: None (Preliminary result)   Collection Time: 07/29/15  2:11 PM  Result Value Ref Range   Specimen Description SYNOVIAL LEFT KNEE    Special Requests NONE    Gram Stain      NO WBC SEEN NO ORGANISMS SEEN Performed at [facility]    Culture PENDING    Report Status PENDING   Glucose, capillary     Status: Abnormal   Collection Time: 07/29/15  3:45 PM  Result Value Ref Range   Glucose-Capillary 145 (H) 65 - 99 mg/dL  Glucose, capillary     Status: Abnormal  Collection Time: 07/29/15  9:50 PM  Result Value Ref Range   Glucose-Capillary 151 (H) 65 - 99 mg/dL  CBC     Status: Abnormal   Collection Time: 07/30/15  5:38 AM  Result Value Ref Range   WBC 7.4 4.0 - 10.5 K/uL   RBC 3.36 (L) 4.22 - 5.81 MIL/uL   Hemoglobin 9.0 (L) 13.0 - 17.0  g/dL    Comment: REPEATED TO VERIFY SPECIMEN CHECKED FOR CLOTS    HCT 28.6 (L) 39.0 - 52.0 %   MCV 85.1 78.0 - 100.0 fL   MCH 26.8 26.0 - 34.0 pg   MCHC 31.5 30.0 - 36.0 g/dL   RDW 18.3 (H) 11.5 - 15.5 %   Platelets 202 150 - 400 K/uL  Basic metabolic panel     Status: Abnormal   Collection Time: 07/30/15  5:38 AM  Result Value Ref Range   Sodium 144 135 - 145 mmol/L   Potassium 5.5 (H) 3.5 - 5.1 mmol/L   Chloride 111 101 - 111 mmol/L   CO2 24 22 - 32 mmol/L   Glucose, Bld 113 (H) 65 - 99 mg/dL   BUN 29 (H) 6 - 20 mg/dL   Creatinine, Ser 2.13 (H) 0.61 - 1.24 mg/dL   Calcium 8.5 (L) 8.9 - 10.3 mg/dL   GFR calc non Af Amer 26 (L) >60 mL/min   GFR calc Af Amer 31 (L) >60 mL/min    Comment: (NOTE) The eGFR has been calculated using the CKD EPI equation. This calculation has not been validated in all clinical situations. eGFR's persistently <60 mL/min signify possible Chronic Kidney Disease.    Anion gap 9 5 - 15  Glucose, capillary     Status: None   Collection Time: 07/30/15  6:41 AM  Result Value Ref Range   Glucose-Capillary 95 65 - 99 mg/dL    Imaging: No results found.  Assessment/Plan 1. Left infected TKA, s/p I&D, needs long-term abx therapy -we will plan to place a tunneled perm cath today for abx access. -labs and vitals have been reviewed and are stable.  He has been NPO -Risks and Benefits discussed with the patient including, but not limited to bleeding, infection, vascular injury, pneumothorax which may require chest tube placement, air embolism or even death All of the patient's questions were answered, patient is agreeable to proceed. Consent signed and in chart.  Thank you for this interesting consult.  I greatly enjoyed meeting KYI ROMANELLO and look forward to participating in their care.  A copy of this report was sent to the requesting provider on this date.  Electronically Signed: Henreitta Cea 07/30/2015, 11:37 AM   I spent a total of 20 Minutes     in face to face in clinical consultation, greater than 50% of which was counseling/coordinating care for infected left Total knee arthroplasty, needs perm cath for long term abx therapy

## 2015-07-31 DIAGNOSIS — Z959 Presence of cardiac and vascular implant and graft, unspecified: Secondary | ICD-10-CM

## 2015-07-31 LAB — CBC
HEMATOCRIT: 25.4 % — AB (ref 39.0–52.0)
Hemoglobin: 8.2 g/dL — ABNORMAL LOW (ref 13.0–17.0)
MCH: 27.6 pg (ref 26.0–34.0)
MCHC: 32.3 g/dL (ref 30.0–36.0)
MCV: 85.5 fL (ref 78.0–100.0)
Platelets: 186 10*3/uL (ref 150–400)
RBC: 2.97 MIL/uL — ABNORMAL LOW (ref 4.22–5.81)
RDW: 18.2 % — AB (ref 11.5–15.5)
WBC: 7.3 10*3/uL (ref 4.0–10.5)

## 2015-07-31 LAB — BASIC METABOLIC PANEL
ANION GAP: 5 (ref 5–15)
BUN: 29 mg/dL — AB (ref 6–20)
CALCIUM: 8.1 mg/dL — AB (ref 8.9–10.3)
CO2: 21 mmol/L — AB (ref 22–32)
Chloride: 114 mmol/L — ABNORMAL HIGH (ref 101–111)
Creatinine, Ser: 1.93 mg/dL — ABNORMAL HIGH (ref 0.61–1.24)
GFR calc Af Amer: 35 mL/min — ABNORMAL LOW (ref >60)
GFR calc non Af Amer: 30 mL/min — ABNORMAL LOW (ref >60)
GLUCOSE: 218 mg/dL — AB (ref 65–99)
Potassium: 4.6 mmol/L (ref 3.5–5.1)
Sodium: 140 mmol/L (ref 135–145)

## 2015-07-31 LAB — GLUCOSE, CAPILLARY
GLUCOSE-CAPILLARY: 170 mg/dL — AB (ref 65–99)
Glucose-Capillary: 202 mg/dL — ABNORMAL HIGH (ref 65–99)

## 2015-07-31 LAB — HEMOGLOBIN A1C
Hgb A1c MFr Bld: 7.3 % — ABNORMAL HIGH (ref 4.8–5.6)
Mean Plasma Glucose: 163 mg/dL

## 2015-07-31 MED ORDER — HEPARIN SOD (PORK) LOCK FLUSH 100 UNIT/ML IV SOLN
250.0000 [IU] | INTRAVENOUS | Status: AC | PRN
  Administered 2015-07-31: 250 [IU]

## 2015-07-31 MED ORDER — ONDANSETRON HCL 4 MG PO TABS: 4.0000 mg | ORAL_TABLET | Freq: Four times a day (QID) | ORAL | Status: DC | PRN

## 2015-07-31 MED ORDER — VANCOMYCIN HCL IN DEXTROSE 1-5 GM/200ML-% IV SOLN: 1000.0000 mg | INTRAVENOUS | Status: DC

## 2015-07-31 MED ORDER — APIXABAN 2.5 MG PO TABS
2.5000 mg | ORAL_TABLET | Freq: Two times a day (BID) | ORAL | Status: DC
  Administered 2015-07-31: 2.5 mg via ORAL
  Filled 2015-07-31: qty 1

## 2015-07-31 MED ORDER — HYDROCODONE-ACETAMINOPHEN 5-325 MG PO TABS: 1.0000 | ORAL_TABLET | ORAL | Status: DC | PRN

## 2015-07-31 NOTE — Progress Notes (Signed)
Dean Murphy to be D/C'd Home per MD order. Discussed with the patient and all questions fully answered.    Medication List    STOP taking these medications        doxycycline 100 MG EC tablet  Commonly known as:  DORYX      TAKE these medications        apixaban 2.5 MG Tabs tablet  Commonly known as:  ELIQUIS  Take 1 tablet (2.5 mg total) by mouth 2 (two) times daily.     benazepril 20 MG tablet  Commonly known as:  LOTENSIN  Take 1 tablet (20 mg total) by mouth daily.     diltiazem 360 MG 24 hr capsule  Commonly known as:  TIAZAC  Take 1 capsule (360 mg total) by mouth daily.     EYE WASH 99.05 % Soln  Place 1 drop into both eyes 2 (two) times daily.     ferrous sulfate 325 (65 FE) MG tablet  Commonly known as:  [facility] IRON  Take 1 tablet (325 mg total) by mouth 2 (two) times daily with a meal.     furosemide 40 MG tablet  Commonly known as:  LASIX  Take 1 tablet (40 mg total) by mouth daily as needed.     glipiZIDE 5 MG tablet  Commonly known as:  GLUCOTROL  Take 2 tablets (10 mg total) by mouth daily before breakfast. And 1 tab before supper     HYDROcodone-acetaminophen 5-325 MG tablet  Commonly known as:  NORCO/VICODIN  Take 1-2 tablets by mouth every 4 (four) hours as needed (breakthrough pain).     Insulin Glargine 100 UNIT/ML Solostar Pen  Commonly known as:  LANTUS SOLOSTAR  Inject 5-10 Units into the skin daily at 10 pm.     Insulin Pen Needle 31G X 5 MM Misc  Use daily with insulin pen     lubiprostone 8 MCG capsule  Commonly known as:  AMITIZA  TAKE 1 CAPSULE BY MOUTH  DAILY WITH  BREAKFAST     NON FORMULARY  daily. careconcepts lancets an d glucometer strips     omeprazole 40 MG capsule  Commonly known as:  PRILOSEC  Take 1 capsule (40 mg total) by mouth daily.     ondansetron 4 MG tablet  Commonly known as:  ZOFRAN  Take 1 tablet (4 mg total) by mouth every 6 (six) hours as needed for nausea.     rosuvastatin 20 MG tablet  Commonly known  as:  CRESTOR  Take 1 tablet (20 mg total) by mouth daily.     sitaGLIPtin 50 MG tablet  Commonly known as:  JANUVIA  Take 1 tablet (50 mg total) by mouth daily.     vancomycin 1 GM/200ML Soln  Commonly known as:  VANCOCIN  Inject 200 mLs (1,000 mg total) into the vein daily.        VVS, Skin clean, dry and intact without evidence of skin break down, no evidence of skin tears noted.  IV catheter discontinued intact. Site without signs and symptoms of complications. Dressing and pressure applied.  An After Visit Summary was printed and given to the patient.  Patient escorted via [place], and D/C home via private auto.  Cyndra Numbers  07/31/2015 4:29 PM

## 2015-07-31 NOTE — Discharge Instructions (Signed)
°Dr. Alee Katen °Total Joint Specialist °[facility] °[address] °[place], [place] [address] °[phone] ° °TOTAL KNEE REPLACEMENT POSTOPERATIVE DIRECTIONS ° ° ° °Knee Rehabilitation, Guidelines Following Surgery  °Results after knee surgery are often greatly improved when you follow the exercise, range of motion and muscle strengthening exercises prescribed by your doctor. Safety measures are also important to protect the knee from further injury. Any time any of these exercises cause you to have increased pain or swelling in your knee joint, decrease the amount until you are comfortable again and slowly increase them. If you have problems or questions, call your caregiver or physical therapist for advice.  ° °WEIGHT BEARING °Weight bearing as tolerated with assist device (walker, cane, etc) as directed, use it as long as suggested by your surgeon or therapist, typically at least 4-6 weeks. ° °HOME CARE INSTRUCTIONS  °Remove items at home which could result in a fall. This includes throw rugs or furniture in walking pathways.  °Continue medications as instructed at time of discharge. °You may have some home medications which will be placed on hold until you complete the course of blood thinner medication.  °You may start showering once you are discharged home but do not submerge the incision under water. Just pat the incision dry and apply a dry gauze dressing on daily. °Walk with walker as instructed.  °You may resume a sexual relationship in one month or when given the OK by your doctor.  °· Use walker as long as suggested by your caregivers. °· Avoid periods of inactivity such as sitting longer than an hour when not asleep. This helps prevent blood clots.  °You may put full weight on your legs and walk as much as is comfortable.  °You may return to work once you are cleared by your doctor.  °Do not drive a car for 6 weeks or until released by you surgeon.  °· Do not drive while  taking narcotics.  °Wear the elastic stockings for three weeks following surgery during the day but you may remove then at night. °Make sure you keep all of your appointments after your operation with all of your doctors and caregivers. You should call the office at the above phone number and make an appointment for approximately two weeks after the date of your surgery. °Do not remove your surgical dressing. The dressing is waterproof; you may take showers in 3 days, but do not take tub baths or submerge the dressing. °Please pick up a stool softener and laxative for home use as long as you are requiring pain medications. °· ICE to the affected knee every three hours for 30 minutes at a time and then as needed for pain and swelling.  Continue to use ice on the knee for pain and swelling from surgery. You may notice swelling that will progress down to the foot and ankle.  This is normal after surgery.  Elevate the leg when you are not up walking on it.   °It is important for you to complete the blood thinner medication as prescribed by your doctor. °· Continue to use the breathing machine which will help keep your temperature down.  It is common for your temperature to cycle up and down following surgery, especially at night when you are not up moving around and exerting yourself.  The breathing machine keeps your lungs expanded and your temperature down. ° °RANGE OF MOTION AND STRENGTHENING EXERCISES  °Rehabilitation of the knee is important following a   knee injury or an operation. After just a few days of immobilization, the muscles of the thigh which control the knee become weakened and shrink (atrophy). Knee exercises are designed to build up the tone and strength of the thigh muscles and to improve knee motion. Often times heat used for twenty to thirty minutes before working out will loosen up your tissues and help with improving the range of motion but do not use heat for the first two weeks following  surgery. These exercises can be done on a training (exercise) mat, on the floor, on a table or on a bed. Use what ever works the best and is most comfortable for you Knee exercises include:  Leg Lifts - While your knee is still immobilized in a splint or cast, you can do straight leg raises. Lift the leg to 60 degrees, hold for 3 sec, and slowly lower the leg. Repeat 10-20 times 2-3 times daily. Perform this exercise against resistance later as your knee gets better.  Quad and Hamstring Sets - Tighten up the muscle on the front of the thigh (Quad) and hold for 5-10 sec. Repeat this 10-20 times hourly. Hamstring sets are done by pushing the foot backward against an object and holding for 5-10 sec. Repeat as with quad sets.  A rehabilitation program following serious knee injuries can speed recovery and prevent re-injury in the future due to weakened muscles. Contact your doctor or a physical therapist for more information on knee rehabilitation.   SKILLED REHAB INSTRUCTIONS: If the patient is transferred to a skilled rehab facility following release from the hospital, a list of the current medications will be sent to the facility for the patient to continue.  When discharged from the skilled rehab facility, please have the facility set up the patient's [place] prior to being released. Also, the skilled facility will be responsible for providing the patient with their medications at time of release from the facility to include their pain medication, the muscle relaxants, and their blood thinner medication. If the patient is still at the rehab facility at time of the two week follow up appointment, the skilled rehab facility will also need to assist the patient in arranging follow up appointment in our office and any transportation needs.  MAKE SURE YOU:  Understand these instructions.  Will watch your condition.  Will get help right away if you are not doing well or get worse.     Pick up stool softner and laxative for home use following surgery while on pain medications. Do NOT remove your dressing. You may shower.  Do not take tub baths or submerge incision under water. May shower starting three days after surgery. Please use a clean towel to pat the incision dry following showers. Continue to use ice for pain and swelling after surgery. Do not use any lotions or creams on the incision until instructed by your surgeon. IV antibiotics and weekly blood work per infectious disease (Dr. Megan Salon)

## 2015-07-31 NOTE — Progress Notes (Signed)
   Subjective:  Patient reports pain as mild to moderate.  No c/o. Received central line yesterday. Wants to go home; feels fine.  Objective:   VITALS:   Filed Vitals:   07/30/15 1145 07/30/15 1146 07/30/15 2047 07/31/15 0532  BP:  148/78 128/48 105/56  Pulse: 81  77 69  Temp:   98 F (36.7 C) 97.8 F (36.6 C)  TempSrc:   Oral Oral  Resp: 19  16 16   Height:      Weight:      SpO2: 100%  93% 95%    ABD soft Sensation intact distally Intact pulses distally Dorsiflexion/Plantar flexion intact Incision: dressing C/D/I Compartment soft   Lab Results  Component Value Date   WBC 7.3 07/31/2015   HGB 8.2* 07/31/2015   HCT 25.4* 07/31/2015   MCV 85.5 07/31/2015   PLT 186 07/31/2015   BMET    Component Value Date/Time   NA 140 07/31/2015 0500   K 4.6 07/31/2015 0500   CL 114* 07/31/2015 0500   CO2 21* 07/31/2015 0500   GLUCOSE 218* 07/31/2015 0500   GLUCOSE 120* 04/28/2006 0917   BUN 29* 07/31/2015 0500   CREATININE 1.93* 07/31/2015 0500   CALCIUM 8.1* 07/31/2015 0500   GFRNONAA 30* 07/31/2015 0500   GFRAA 35* 07/31/2015 0500   Intraop culture (3/6): rare GPC  Assessment/Plan: 2 Days Post-Op   Principal Problem:   Infection of prosthetic left knee joint ([place]) Active Problems:   DM (diabetes mellitus), type 2 with renal complications (HCC)   Dyslipidemia   Essential hypertension   Osteoarthritis   PVD- s/p bilat CEA- CA dopplers OK Jan 2015   Complete heart block (HCC)   Acute on chronic renal insufficiency (HCC)   S/P CABG x 5 1989. Low risk Myoview 2012   Atrial fibrillation-CHADS VASC2 = 5 (age, HTN, DM, Vasc)   Anemia   Pacemaker   Stroke [facility])   Coronary artery disease    WBAT with walker DVT ppx: eliquis home dose Enterococcus PJI: ID rec 6 weeks IV vanco, then PO amox CKD Stage IV: Cr stable DM II: strict glucose control PT/OT PO pain control Acute blood loss on chronic anemia: stable, monitor Dispo: d/c home when ok with ID, will  require 6 weeks IV vancomycin, needs f/u with me and Dr. Candie Echevaria, Kaven Deltoro 07/31/2015, 7:27 AM   Rod Can, MD Cell [phone]

## 2015-07-31 NOTE — Progress Notes (Signed)
Physical Therapy Treatment Patient Details Name: Dean Murphy MRN: [id number] DOB: 02-16-1927 Today's Date: 07/31/2015    History of Present Illness Dean Murphy is a 82 y.o. male who presents with JOINT INFECTION LEFT KNEE . H/o left TKA 18-19 years ago by Dr. Noemi Murphy. Has about one week of left knee pain and swelling. Pt s/p L knee I & D (07/29/15). PMH: CABG, DM, pacemaker    PT Comments    Patient stated that he felt much better today and that it was much easier to walk. Able to practice steps with family present. Patient safe to D/C from a mobility standpoint based on progression towards goals set on PT eval.    Follow Up Recommendations  Home health PT     Equipment Recommendations  None recommended by PT    Recommendations for Other Services       Precautions / Restrictions Precautions Precautions: Fall;None Precaution Comments: Reviewed not placing pillow, ice pack or other object under L knee Restrictions Weight Bearing Restrictions: Yes LLE Weight Bearing: Weight bearing as tolerated    Mobility  Bed Mobility Overal bed mobility: Needs Assistance Bed Mobility: Supine to Sit     Supine to sit: Min guard     General bed mobility comments: Pt up with PT on OT arrival  Transfers Overall transfer level: Needs assistance Equipment used: Rolling walker (2 wheeled) Transfers: Sit to/from Stand Sit to Stand: Supervision         General transfer comment: Supervision for safety. Patient with safe technique  Ambulation/Gait Ambulation/Gait assistance: Min guard Ambulation Distance (Feet): 150 Feet Assistive device: Rolling walker (2 wheeled) Gait Pattern/deviations: Step-through pattern;Decreased stride length     General Gait Details: Cues for posture and safe use of RW.    Stairs Stairs: Yes Stairs assistance: Min guard Stair Management: Step to pattern;Sideways;One rail Left Number of Stairs: 3 General stair comments: Patient able to practice 3 step  twice using sideways technique. Family was present for education  Wheelchair Mobility    Modified Rankin (Stroke Patients Only)       Balance Overall balance assessment: Needs assistance Sitting-balance support: No upper extremity supported;Feet supported Sitting balance-Leahy Scale: Good     Standing balance support: No upper extremity supported;During functional activity Standing balance-Leahy Scale: Fair                      Cognition Arousal/Alertness: Awake/alert Behavior During Therapy: WFL for tasks assessed/performed Overall Cognitive Status: Within Functional Limits for tasks assessed                      Exercises      General Comments        Pertinent Vitals/Pain Pain Assessment: 0-10 Pain Score: 3  Pain Location: L knee Pain Descriptors / Indicators: Aching;Sore Pain Intervention(s): Monitored during session    Home Living                      Prior Function            PT Goals (current goals can now be found in the care plan section) Acute Rehab PT Goals Patient Stated Goal: to go home and decrease pain Progress towards PT goals: Progressing toward goals    Frequency  Min 5X/week    PT Plan Current plan remains appropriate    Co-evaluation             End of Session Equipment  Utilized During Treatment: Gait belt Activity Tolerance: Patient tolerated treatment well Patient left:  (with OT in gym)     Time: [id number] PT Time Calculation (min) (ACUTE ONLY): 14 min  Charges:  $Gait Training: 8-22 mins                    G Codes:      Jacqualyn Posey 07/31/2015, 1:15 PM  07/31/2015 Ilze Roselli, Tonia Brooms PTA

## 2015-07-31 NOTE — Discharge Summary (Signed)
Physician Discharge Summary  Patient ID: Dean Murphy MRN: [id number] DOB/AGE: 1927-02-11 82 y.o.  Admit date: 07/29/2015 Discharge date: 08/01/2015  Admission Diagnoses:  Infection of prosthetic left knee joint [facility])  Discharge Diagnoses:  Principal Problem:   Infection of prosthetic left knee joint ([place]) Active Problems:   DM (diabetes mellitus), type 2 with renal complications ([place])   Dyslipidemia   Essential hypertension   Osteoarthritis   PVD- s/p bilat CEA- CA dopplers [place] Jan 2015   Complete heart block (HCC)   Acute on chronic renal insufficiency (HCC)   S/P CABG x 5 1989. Low risk Myoview 2012   Atrial fibrillation-CHADS VASC2 = 5 (age, HTN, DM, Vasc)   Anemia   Pacemaker   Stroke [facility])   Coronary artery disease   Past Medical History  Diagnosis Date  . GERD (gastroesophageal reflux disease)   . Hyperlipidemia   . Essential hypertension   . Arthritis   . Diverticulosis   . Hemorrhoids   . Colon polyps   . CAD (coronary artery disease)     a. CABG 1989, b. Myoview low risk 2012.  [name] CHB (complete heart block) [facility]) March 2016    a. s/p STJ dual chamber pacemaker 07/2014.  . Osteoporosis   . PAF (paroxysmal atrial fibrillation) [facility]) March 2016  . Chronic diastolic CHF (congestive heart failure) ([place])     a. Dx [id number] - acute diastolic CHF in the setting of CHB.  . Carotid artery occlusion     a. Duplex 10/2014: patent R/L CEA with mild hyperplasia in right surgical bulb - followed by vascular.  . Myocardial infarction [facility]) 1976  and Mar. 19, 2016  . CVA (cerebral infarction)   . Orthostasis   . Anemia   . Complication of anesthesia   . PONV (postoperative nausea and vomiting)   . Presence of permanent cardiac pacemaker   . Shortness of breath dyspnea     due to pain  . Diabetes mellitus     type II  . Chronic kidney disease, stage IV (severe) (HCC)     stage III/IV as of 2015, [name]  . CKD (chronic kidney disease), stage IV ([place])   .  Constipation   . Dupuytren's disease     finger right hand contracted    Surgeries: Procedure(s): IRRIGATION AND DEBRIDEMENT LEFT KNEE WITH POLY EXCHANGE on 07/29/2015   Consultants (if any):    Discharged Condition: Improved  Hospital Course: Dean Murphy is an 82 y.o. male who was admitted 07/29/2015 with a diagnosis of Infection of prosthetic left knee joint ([place]) and went to the operating room on 07/29/2015 and underwent the above named procedures.  Preop cultures showed vanc sensitive enterococcus.  He was given perioperative antibiotics:      Anti-infectives    Start     Dose/Rate Route Frequency Ordered Stop   07/31/15 0000  vancomycin (VANCOCIN) 1 GM/200ML SOLN    Comments:  6 weeks treatment duration   1,000 mg 200 mL/hr over 60 Minutes Intravenous Every 24 hours 07/31/15 0734     07/30/15 1113  ceFAZolin (ANCEF) 2-3 GM-% IVPB SOLR    Comments:  Duininck, Stacey   : cabinet override      07/30/15 1113 07/30/15 1125   07/30/15 0800  vancomycin (VANCOCIN) IVPB 1000 mg/200 mL premix  Status:  Discontinued     1,000 mg 200 mL/hr over 60 Minutes Intravenous Every 24 hours 07/29/15 1745 07/31/15 1848   07/29/15 1330  vancomycin (  VANCOCIN) IVPB 1000 mg/200 mL premix     1,000 mg 200 mL/hr over 60 Minutes Intravenous To Surgery 07/29/15 1317 07/29/15 1423   07/29/15 1053  ceFAZolin (ANCEF) IVPB 2 g/50 mL premix     2 g 100 mL/hr over 30 Minutes Intravenous On call to O.R. 07/29/15 1053 07/29/15 1310    .  Postoperatively, he was made WBAT. Infectious disease (Dr. Megan Salon) saw the patient and recommended 6 weeks IV vancomycin followed by PO amoxicillin. He received a tunneled R IJ central line by Interventional radiology.  He was given sequential compression devices, early ambulation, and apixaban for DVT prophylaxis.  He benefited maximally from the hospital stay and there were no complications.    Recent vital signs:  Filed Vitals:   07/31/15 0532 07/31/15 1300  BP: 105/56  101/67  Pulse: 69 68  Temp: 97.8 F (36.6 C) 97.5 F (36.4 C)  Resp: 16 18    Recent laboratory studies:  Lab Results  Component Value Date   HGB 8.2* 07/31/2015   HGB 9.0* 07/30/2015   HGB 12.1* 07/29/2015   Lab Results  Component Value Date   WBC 7.3 07/31/2015   PLT 186 07/31/2015   Lab Results  Component Value Date   INR 1.22 07/29/2015   Lab Results  Component Value Date   NA 140 07/31/2015   K 4.6 07/31/2015   CL 114* 07/31/2015   CO2 21* 07/31/2015   BUN 29* 07/31/2015   CREATININE 1.93* 07/31/2015   GLUCOSE 218* 07/31/2015    Discharge Medications:     Medication List    STOP taking these medications        doxycycline 100 MG EC tablet  Commonly known as:  DORYX      TAKE these medications        apixaban 2.5 MG Tabs tablet  Commonly known as:  ELIQUIS  Take 1 tablet (2.5 mg total) by mouth 2 (two) times daily.     benazepril 20 MG tablet  Commonly known as:  LOTENSIN  Take 1 tablet (20 mg total) by mouth daily.     diltiazem 360 MG 24 hr capsule  Commonly known as:  TIAZAC  Take 1 capsule (360 mg total) by mouth daily.     EYE WASH 99.05 % Soln  Place 1 drop into both eyes 2 (two) times daily.     ferrous sulfate 325 (65 FE) MG tablet  Commonly known as:  [employer] IRON  Take 1 tablet (325 mg total) by mouth 2 (two) times daily with a meal.     furosemide 40 MG tablet  Commonly known as:  LASIX  Take 1 tablet (40 mg total) by mouth daily as needed.     glipiZIDE 5 MG tablet  Commonly known as:  GLUCOTROL  Take 2 tablets (10 mg total) by mouth daily before breakfast. And 1 tab before supper     HYDROcodone-acetaminophen 5-325 MG tablet  Commonly known as:  NORCO/VICODIN  Take 1-2 tablets by mouth every 4 (four) hours as needed (breakthrough pain).     Insulin Glargine 100 UNIT/ML Solostar Pen  Commonly known as:  LANTUS SOLOSTAR  Inject 5-10 Units into the skin daily at 10 pm.     Insulin Pen Needle 31G X 5 MM Misc  Use daily with  insulin pen     lubiprostone 8 MCG capsule  Commonly known as:  AMITIZA  TAKE 1 CAPSULE BY MOUTH  DAILY WITH  BREAKFAST  NON FORMULARY  daily. careconcepts lancets an d glucometer strips     omeprazole 40 MG capsule  Commonly known as:  PRILOSEC  Take 1 capsule (40 mg total) by mouth daily.     ondansetron 4 MG tablet  Commonly known as:  ZOFRAN  Take 1 tablet (4 mg total) by mouth every 6 (six) hours as needed for nausea.     rosuvastatin 20 MG tablet  Commonly known as:  CRESTOR  Take 1 tablet (20 mg total) by mouth daily.     sitaGLIPtin 50 MG tablet  Commonly known as:  JANUVIA  Take 1 tablet (50 mg total) by mouth daily.     vancomycin 1 GM/200ML Soln  Commonly known as:  VANCOCIN  Inject 200 mLs (1,000 mg total) into the vein daily.        Diagnostic Studies: Ir Fluoro Guide Cv Line Right  07/30/2015  CLINICAL DATA:  Infected knee replacement hardware, needs long-term access for IV antibiotics. EXAM: TUNNELED CENTRAL VENOUS CATHETER PLACEMENT WITH ULTRASOUND AND FLUOROSCOPIC GUIDANCE TECHNIQUE: The procedure, risks, benefits, and alternatives were explained to the patient. Questions regarding the procedure were encouraged and answered. The patient understands and consents to the procedure. As antibiotic prophylaxis, CEFAZOLIN 2 G was ordered pre-procedure and administered intravenously within one hour of incision.Patency of the right IJ vein was confirmed with ultrasound with image documentation. An appropriate skin site was determined. Region was prepped using maximum barrier technique including cap and mask, sterile gown, sterile gloves, large sterile sheet, and Chlorhexidine as cutaneous antisepsis. The region was infiltrated locally with 1% lidocaine. Intravenous Fentanyl and Versed were administered as conscious sedation during continuous monitoring of the patient's level of consciousness and physiological / cardiorespiratory status by the radiology RN, with a total  moderate sedation time of 10 minutes. Under real-time ultrasound guidance, the right IJ vein was accessed with a 21 gauge micropuncture needle; the needle tip within the vein was confirmed with ultrasound image documentation. 95F dual-lumen power PICC tunneled from a right anterior chest wall approach to the dermatotomy site. Needle exchanged over the 018 guidewire for transitional dilator, through which the catheter which had been cut to appropriate length was advanced under intermittent fluoroscopy, positioned with its tip at the cavoatrial junction. Spot chest radiograph confirms good catheter position. No pneumothorax. Catheter was flushed and primed per protocol. Catheter secured externally with O Prolene sutures. The right IJ dermatotomy site was closed with Dermabond. COMPLICATIONS: COMPLICATIONS None immediate FLUOROSCOPY TIME:  6 seconds, less than 1 mGy COMPARISON:  None IMPRESSION: 1. Technically successful placement of tunneled right IJ tunneled dual-lumen power injectable catheter with ultrasound and fluoroscopic guidance. Ready for routine use. ACCESS: Remains approachable for percutaneous intervention as needed. Electronically Signed   By: Lucrezia Europe M.D.   On: 07/30/2015 12:09   Ir US Guide Vasc Access Right  07/30/2015  CLINICAL DATA:  Infected knee replacement hardware, needs long-term access for IV antibiotics. EXAM: TUNNELED CENTRAL VENOUS CATHETER PLACEMENT WITH ULTRASOUND AND FLUOROSCOPIC GUIDANCE TECHNIQUE: The procedure, risks, benefits, and alternatives were explained to the patient. Questions regarding the procedure were encouraged and answered. The patient understands and consents to the procedure. As antibiotic prophylaxis, CEFAZOLIN 2 G was ordered pre-procedure and administered intravenously within one hour of incision.Patency of the right IJ vein was confirmed with ultrasound with image documentation. An appropriate skin site was determined. Region was prepped using maximum barrier  technique including cap and mask, sterile gown, sterile gloves, large sterile sheet, and Chlorhexidine  as cutaneous antisepsis. The region was infiltrated locally with 1% lidocaine. Intravenous Fentanyl and Versed were administered as conscious sedation during continuous monitoring of the patient's level of consciousness and physiological / cardiorespiratory status by the radiology RN, with a total moderate sedation time of 10 minutes. Under real-time ultrasound guidance, the right IJ vein was accessed with a 21 gauge micropuncture needle; the needle tip within the vein was confirmed with ultrasound image documentation. 41F dual-lumen power PICC tunneled from a right anterior chest wall approach to the dermatotomy site. Needle exchanged over the 018 guidewire for transitional dilator, through which the catheter which had been cut to appropriate length was advanced under intermittent fluoroscopy, positioned with its tip at the cavoatrial junction. Spot chest radiograph confirms good catheter position. No pneumothorax. Catheter was flushed and primed per protocol. Catheter secured externally with O Prolene sutures. The right IJ dermatotomy site was closed with Dermabond. COMPLICATIONS: COMPLICATIONS None immediate FLUOROSCOPY TIME:  6 seconds, less than 1 mGy COMPARISON:  None IMPRESSION: 1. Technically successful placement of tunneled right IJ tunneled dual-lumen power injectable catheter with ultrasound and fluoroscopic guidance. Ready for routine use. ACCESS: Remains approachable for percutaneous intervention as needed. Electronically Signed   By: Lucrezia Europe M.D.   On: 07/30/2015 12:09    Disposition: 06-Home-Health Care Svc  Discharge Instructions    Call MD / Call 911    Complete by:  As directed   If you experience chest pain or shortness of breath, CALL 911 and be transported to the hospital emergency room.  If you develope a fever above 101 F, pus (white drainage) or increased drainage or redness at the  wound, or calf pain, call your surgeon's office.     Constipation Prevention    Complete by:  As directed   Drink plenty of fluids.  Prune juice may be helpful.  You may use a stool softener, such as Colace (over the counter) 100 mg twice a day.  Use MiraLax (over the counter) for constipation as needed.     Diet - low sodium heart healthy    Complete by:  As directed      Driving restrictions    Complete by:  As directed   No driving for 6 weeks     Increase activity slowly as tolerated    Complete by:  As directed      Lifting restrictions    Complete by:  As directed   No lifting for 6 weeks     TED hose    Complete by:  As directed   Use stockings (TED hose) for 2 weeks on both leg(s).  You may remove them at night for sleeping.           Follow-up Information    Follow up with Jeniah Kishi, Horald Pollen, MD. Schedule an appointment as soon as possible for a visit in 2 weeks.   Specialty:  Orthopedic Surgery   Why:  For wound re-check, For suture removal   Contact information:   [address] [place] [address] [phone]       Follow up with Michel Bickers, MD. Schedule an appointment as soon as possible for a visit in 2 weeks.   Specialty:  Infectious Diseases   Contact information:   [address] [place] [address] [phone]        Signed: Kanye, Venecia 08/01/2015, 12:35 PM

## 2015-07-31 NOTE — Progress Notes (Signed)
Occupational Therapy Treatment/Discharge Patient Details Name: Dean Murphy MRN: [id number] DOB: Jul 10, 1926 Today's Date: 07/31/2015    History of present illness Dean Murphy is a 82 y.o. male who presents with JOINT INFECTION LEFT KNEE . H/o left TKA 18-19 years ago by Dr. Noemi Chapel. Has about one week of left knee pain and swelling. Pt s/p L knee I & D (07/29/15). PMH: CABG, DM, pacemaker   OT comments  Practiced tub transfer with 3in1 as shower seat and therapist/family agreed this was not the safest option for pt based on performance during session. Educated and demonstrated use of tub bench for tub transfer and pt/family agreed this was best option. Reviewed knee precautions, pain/edema management, energy conservation, compensatory strategies for ADLs, and fall prevention strategies. All education has been completed and pt/family have no further questions. Pt with no further acute OT needs. Pt adequate for discharge from occupational therapy standpoint. OT signing off.   Follow Up Recommendations  No OT follow up;Supervision - Intermittent    Equipment Recommendations  Tub/shower bench    Recommendations for Other Services      Precautions / Restrictions Precautions Precautions: Fall;None Precaution Comments: Reviewed not placing pillow, ice pack or other object under L knee Restrictions Weight Bearing Restrictions: Yes LLE Weight Bearing: Weight bearing as tolerated       Mobility Bed Mobility               General bed mobility comments: Pt up with PT on OT arrival  Transfers Overall transfer level: Needs assistance Equipment used: Rolling walker (2 wheeled) Transfers: Sit to/from Stand Sit to Stand: Supervision         General transfer comment: Supervision for safety. Good demonstration of safe hand placement on seated surfaces. Pt with increased L knee flexion with sit-stand transfers    Balance Overall balance assessment: Needs assistance Sitting-balance  support: No upper extremity supported;Feet supported Sitting balance-Leahy Scale: Good     Standing balance support: No upper extremity supported;During functional activity Standing balance-Leahy Scale: Fair                     ADL Overall ADL's : Needs assistance/impaired             Lower Body Bathing: Minimal assistance;With caregiver independent assisting;Sit to/from stand       Lower Body Dressing: Minimal assistance;With caregiver independent assisting;Sit to/from stand   Toilet Transfer: Supervision/safety;Ambulation;BSC;RW   Toileting- Clothing Manipulation and Hygiene: Supervision/safety;Sit to/from stand   Tub/ Shower Transfer: Tub transfer;Moderate assistance;Ambulation;3 in 1;Rolling walker;Cueing for sequencing;Cueing for safety Tub/Shower Transfer Details (indicate cue type and reason): Cues for 3in1 use as shower seat - pt performed and did not appear safe to therapist or pt's family. Educated and demonstrated use of tub bench and pt agreeable to purchase one knowing insurance will not approve Functional mobility during ADLs: Minimal assistance;Rolling walker General ADL Comments: Reviewed knee precautions, use of CPM and elevating LLE, pain/edema management, energy conservation, fall prevention strategies and compensatory strategies for ADLs. Practiced tub transfer and pt/family agreed tub bench will be safest option.      Vision                     Perception     Praxis      Cognition   Behavior During Therapy: [facility] for tasks assessed/performed Overall Cognitive Status: Within Functional Limits for tasks assessed  Extremity/Trunk Assessment               Exercises     Shoulder Instructions       General Comments      Pertinent Vitals/ Pain       Pain Assessment: 0-10 Pain Score: 3  Pain Location: L knee and thigh Pain Descriptors / Indicators: Aching;Sore Pain Intervention(s): Limited  activity within patient's tolerance;Monitored during session;Repositioned  Home Living                                          Prior Functioning/Environment              Frequency       Progress Toward Goals  OT Goals(current goals can now be found in the care plan section)  Progress towards OT goals: Goals met/education completed, patient discharged from OT  Acute Rehab OT Goals Patient Stated Goal: to go home and decrease pain OT Goal Formulation: With patient Time For Goal Achievement: 08/13/15 Potential to Achieve Goals: Good ADL Goals Pt Will Perform Lower Body Bathing: with modified independence;sit to/from stand Pt Will Perform Lower Body Dressing: with modified independence;with adaptive equipment;sit to/from stand Pt Will Transfer to Toilet: with modified independence;ambulating Pt Will Perform Toileting - Clothing Manipulation and hygiene: with modified independence;sit to/from stand Pt Will Perform Tub/Shower Transfer: with supervision;tub bench;rolling walker;ambulating  Plan All goals met and education completed, patient discharged from OT services    Co-evaluation                 End of Session Equipment Utilized During Treatment: Gait belt;Rolling walker   Activity Tolerance Patient tolerated treatment well   Patient Left in chair;with call bell/phone within reach;with family/visitor present (using urinal)   Nurse Communication Mobility status        Time: 6151-8343 OT Time Calculation (min): 37 min  Charges: OT General Charges $OT Visit: 1 Procedure OT Treatments $Self Care/Home Management : 23-37 mins  Redmond Baseman, OTR/L Pager: [phone] 07/31/2015, 1:00 PM

## 2015-07-31 NOTE — Progress Notes (Signed)
Inpatient Diabetes Program Recommendations  AACE/ADA: New Consensus Statement on Inpatient Glycemic Control (2015)  Target Ranges:  Prepandial:   less than 140 mg/dL      Peak postprandial:   less than 180 mg/dL (1-2 hours)      Critically ill patients:  140 - 180 mg/dL   Review of Glycemic Control   Inpatient Diabetes Program Recommendations:  Correction (SSI): please add Novolog moderate scale TID  Thank you  Raoul Pitch BSN, RN,CDE Inpatient Diabetes Coordinator [phone] (team pager)

## 2015-07-31 NOTE — Progress Notes (Signed)
Patient ID: Dean Murphy, male   DOB: 01/18/1927, 82 y.o.   MRN: [id number]         [facility]     Date of Admission:  07/29/2015   Total days of antibiotics 2         [name] is feeling better. He has had a central line placed and has been up walking with the physical therapist. It appears that his operative culture is also growing enterococcus. I will plan on 6 weeks of IV vancomycin followed by at least for half months of oral amoxicillin to treat his enterococcal left prosthetic knee infection. I will arrange follow-up in my clinic within the next 4 weeks.  Michel Bickers, MD [facility] [place] [facility] [phone] pager   [phone] cell 05/28/2015, 1:32 PM

## 2015-08-01 ENCOUNTER — Other Ambulatory Visit: Payer: Self-pay | Admitting: *Deleted

## 2015-08-01 MED ORDER — GLIPIZIDE 5 MG PO TABS: 10.0000 mg | ORAL_TABLET | Freq: Every day | ORAL | Status: DC

## 2015-08-01 NOTE — Telephone Encounter (Signed)
Faxed refill request. Last Filled:    270 tablet 1 01/21/2015

## 2015-08-02 ENCOUNTER — Emergency Department (HOSPITAL_COMMUNITY): Payer: Medicare Other

## 2015-08-02 ENCOUNTER — Encounter (HOSPITAL_COMMUNITY): Payer: Self-pay | Admitting: Vascular Surgery

## 2015-08-02 ENCOUNTER — Emergency Department (HOSPITAL_COMMUNITY)
Admission: EM | Admit: 2015-08-02 | Discharge: 2015-08-02 | Disposition: A | Discharge status: Home / Self Care | Payer: Medicare Other | Attending: Emergency Medicine | Admitting: Emergency Medicine

## 2015-08-02 DIAGNOSIS — E785 Hyperlipidemia, unspecified: Secondary | ICD-10-CM | POA: Diagnosis not present

## 2015-08-02 DIAGNOSIS — Y658 Other specified misadventures during surgical and medical care: Secondary | ICD-10-CM | POA: Insufficient documentation

## 2015-08-02 DIAGNOSIS — D649 Anemia, unspecified: Secondary | ICD-10-CM | POA: Insufficient documentation

## 2015-08-02 DIAGNOSIS — N184 Chronic kidney disease, stage 4 (severe): Secondary | ICD-10-CM | POA: Insufficient documentation

## 2015-08-02 DIAGNOSIS — Z8673 Personal history of transient ischemic attack (TIA), and cerebral infarction without residual deficits: Secondary | ICD-10-CM | POA: Diagnosis not present

## 2015-08-02 DIAGNOSIS — Z794 Long term (current) use of insulin: Secondary | ICD-10-CM | POA: Insufficient documentation

## 2015-08-02 DIAGNOSIS — Z87891 Personal history of nicotine dependence: Secondary | ICD-10-CM | POA: Insufficient documentation

## 2015-08-02 DIAGNOSIS — K219 Gastro-esophageal reflux disease without esophagitis: Secondary | ICD-10-CM | POA: Insufficient documentation

## 2015-08-02 DIAGNOSIS — T82868A Thrombosis of vascular prosthetic devices, implants and grafts, initial encounter: Secondary | ICD-10-CM | POA: Diagnosis present

## 2015-08-02 DIAGNOSIS — Z79899 Other long term (current) drug therapy: Secondary | ICD-10-CM | POA: Insufficient documentation

## 2015-08-02 DIAGNOSIS — I5032 Chronic diastolic (congestive) heart failure: Secondary | ICD-10-CM | POA: Insufficient documentation

## 2015-08-02 DIAGNOSIS — I252 Old myocardial infarction: Secondary | ICD-10-CM | POA: Insufficient documentation

## 2015-08-02 DIAGNOSIS — I129 Hypertensive chronic kidney disease with stage 1 through stage 4 chronic kidney disease, or unspecified chronic kidney disease: Secondary | ICD-10-CM | POA: Diagnosis not present

## 2015-08-02 DIAGNOSIS — E119 Type 2 diabetes mellitus without complications: Secondary | ICD-10-CM | POA: Insufficient documentation

## 2015-08-02 DIAGNOSIS — Z7984 Long term (current) use of oral hypoglycemic drugs: Secondary | ICD-10-CM | POA: Diagnosis not present

## 2015-08-02 DIAGNOSIS — B999 Unspecified infectious disease: Secondary | ICD-10-CM

## 2015-08-02 DIAGNOSIS — Z86010 Personal history of colonic polyps: Secondary | ICD-10-CM | POA: Diagnosis not present

## 2015-08-02 DIAGNOSIS — T82898A Other specified complication of vascular prosthetic devices, implants and grafts, initial encounter: Secondary | ICD-10-CM

## 2015-08-02 LAB — BODY FLUID CULTURE

## 2015-08-02 MED ORDER — HYDROCODONE-ACETAMINOPHEN 5-325 MG PO TABS
ORAL_TABLET | ORAL | Status: AC
  Administered 2015-08-02: 2 via ORAL
  Filled 2015-08-02: qty 2

## 2015-08-02 MED ORDER — HEPARIN SOD (PORK) LOCK FLUSH 100 UNIT/ML IV SOLN
INTRAVENOUS | Status: AC
  Filled 2015-08-02: qty 5

## 2015-08-02 MED ORDER — HYDROCODONE-ACETAMINOPHEN 5-325 MG PO TABS
2.0000 | ORAL_TABLET | Freq: Once | ORAL | Status: AC
  Administered 2015-08-02: 2 via ORAL

## 2015-08-02 MED ORDER — LIDOCAINE HCL 1 % IJ SOLN
INTRAMUSCULAR | Status: AC
  Filled 2015-08-02: qty 20

## 2015-08-02 MED ORDER — CHLORHEXIDINE GLUCONATE 4 % EX LIQD
CUTANEOUS | Status: DC
Start: 2015-08-02 — End: 2015-08-02
  Filled 2015-08-02: qty 15

## 2015-08-02 NOTE — ED Notes (Signed)
Pt reports to the ED for eval of vascular access problem. Pt had a central line placed while he was in the hospital for an enterococcal infection in his left knee and he is supposed to be getting Vancomycin through this line however, today when the home health nurse came out she was unable to get the line to flush or draw back so patient was sent here. Has not had his medication today. Pt A&Ox4, resp e/u, and skin warm and dry. Denies any pain at the site.

## 2015-08-02 NOTE — ED Notes (Signed)
Interventional radiology called and stated "the patient was to come to them for procedure." Pt and pts family sent to radiology waiting.

## 2015-08-02 NOTE — Discharge Instructions (Signed)
Continue home medications.  Return to ER for any new or worsening symptoms, any additional concerns.  Keep all scheduled follow up appointments.

## 2015-08-02 NOTE — ED Provider Notes (Signed)
CSN: [id number]     Arrival date & time 08/02/15  1050 History   First MD Initiated Contact with Patient 08/02/15 1446     Chief Complaint  Patient presents with  . Vascular Access Problem     (Consider location/radiation/quality/duration/timing/severity/associated sxs/prior Treatment) The history is provided by the patient and medical records. No language interpreter was used.    Dean Murphy is a 82 y.o. male  with a PMH of multiple chronic medical problem who presents to the Emergency Department for PICC line occlusion. Patient had a right IJ PICC placed on 07/30/15 to receive IV ABX for a left knee enterococcal infection and is on IV vancomycin. Home health nurse came to home to give his medication and was unable to flush the line, therefore informed him to come to the ER. Patient checked in to ER, but was then told that he should be seen by IR directly. IR performed a successful salvage of clotted right IJ PICC prior to my evaluation. The catheter was checked by IR and catheter aspirates and flushes with ease. Patient has no other complaints at this time.   Past Medical History  Diagnosis Date  . GERD (gastroesophageal reflux disease)   . Hyperlipidemia   . Essential hypertension   . Arthritis   . Diverticulosis   . Hemorrhoids   . Colon polyps   . CAD (coronary artery disease)     a. CABG 1989, b. Myoview low risk 2012.  [name] CHB (complete heart block) [facility]) March 2016    a. s/p STJ dual chamber pacemaker 07/2014.  . Osteoporosis   . PAF (paroxysmal atrial fibrillation) [facility]) March 2016  . Chronic diastolic CHF (congestive heart failure) ([place])     a. Dx [id number] - acute diastolic CHF in the setting of CHB.  . Carotid artery occlusion     a. Duplex 10/2014: patent R/L CEA with mild hyperplasia in right surgical bulb - followed by vascular.  . Myocardial infarction [facility]) 1976  and Mar. 19, 2016  . CVA (cerebral infarction)   . Orthostasis   . Anemia   . Complication of anesthesia    . PONV (postoperative nausea and vomiting)   . Presence of permanent cardiac pacemaker   . Shortness of breath dyspnea     due to pain  . Diabetes mellitus     type II  . Chronic kidney disease, stage IV (severe) (HCC)     stage III/IV as of 2015, [name]  . CKD (chronic kidney disease), stage IV ([place])   . Constipation   . Dupuytren's disease     finger right hand contracted   Past Surgical History  Procedure Laterality Date  . Coronary artery bypass graft  1989  . Cholecystectomy    . Total knee arthroplasty      bilateral  . Tonsillectomy    . Carotid endarterectomy  12/31/10    Right  . Carotid endarterectomy  02/09/11    left  . Joint replacement      bilat. knees  . Cardiac catheterization  08/11/2014    Procedure: TEMPORARY PACEMAKER;  Surgeon: [name], MD;  Location: [facility] CATH LAB;  Service: Cardiovascular;;  . Permanent pacemaker insertion N/A 08/13/2014    STJ dual chamber pacemaker implanted by [name] for CHB  . Eye surgery Bilateral     Cataract with implants  . I&d knee with poly exchange Left 07/29/2015    Procedure: IRRIGATION AND DEBRIDEMENT LEFT KNEE WITH POLY  EXCHANGE;  Surgeon: Rod Can, MD;  Location: [place];  Service: Orthopedics;  Laterality: Left;   Family History  Problem Relation Age of Onset  . Diabetes    . Cancer    . Heart disease Mother     Before age 80  . Diabetes Mother   . Varicose Veins Mother   . Heart attack Mother   . Heart attack Father   . Heart disease Father     After age 66  . Hypertension Father   . Colon cancer Neg Hx   . Heart disease Brother     Before age 70  . Hypertension Brother   . Heart attack Brother   . Diabetes Son   . Hypertension Son   . Hypertension Son   . Diabetes Son    Social History  Substance Use Topics  . Smoking status: Former Smoker -- 1.00 packs/day for 25 years    Types: Cigarettes    Quit date: 01/21/1973  . Smokeless tobacco: Never Used     Comment: began  smoking in high school, quit age 43  . Alcohol Use: 0.0 oz/week    0 Standard drinks or equivalent per week     Comment: 1-2 per week    Review of Systems  Constitutional: Negative for fever and chills.  HENT: Negative for congestion.   Eyes: Negative for visual disturbance.  Respiratory: Negative for cough.   Cardiovascular: Negative.   Gastrointestinal: Negative for nausea, vomiting and abdominal pain.  Genitourinary: Negative for dysuria.  Musculoskeletal: Positive for arthralgias (Knee pain s/p surgery).  Skin: Negative for rash.  Neurological: Negative for headaches.      Allergies  Reclast  Home Medications   Prior to Admission medications   Medication Sig Start Date End Date Taking? Authorizing Provider  apixaban (ELIQUIS) 2.5 MG TABS tablet Take 1 tablet (2.5 mg total) by mouth 2 (two) times daily. 10/16/14   Dorothy Spark, MD  benazepril (LOTENSIN) 20 MG tablet Take 1 tablet (20 mg total) by mouth daily. 03/15/15   Tonia Ghent, MD  diltiazem [facility]) 360 MG 24 hr capsule Take 1 capsule (360 mg total) by mouth daily. 07/09/15   Dorothy Spark, MD  ferrous sulfate ([facility] IRON) 325 (65 FE) MG tablet Take 1 tablet (325 mg total) by mouth 2 (two) times daily with a meal. Patient taking differently: Take 325 mg by mouth 2 (two) times daily with a meal. Mon-Fri, skip weekends 06/24/15   Tonia Ghent, MD  furosemide (LASIX) 40 MG tablet Take 1 tablet (40 mg total) by mouth daily as needed. Patient taking differently: Take 40 mg by mouth daily as needed for fluid (swelling).  03/15/14 07/25/15  Tonia Ghent, MD  glipiZIDE (GLUCOTROL) 5 MG tablet Take 2 tablets (10 mg total) by mouth daily before breakfast. And 1 tab before supper 08/01/15   Tonia Ghent, MD  HYDROcodone-acetaminophen (NORCO/VICODIN) 5-325 MG tablet Take 1-2 tablets by mouth every 4 (four) hours as needed (breakthrough pain). 07/31/15   Rod Can, MD  Insulin Glargine (LANTUS SOLOSTAR) 100 UNIT/ML  Solostar Pen Inject 5-10 Units into the skin daily at 10 pm. Patient taking differently: Inject 5-10 Units into the skin daily as needed (Before breakfast).  03/15/15   Tonia Ghent, MD  Insulin Pen Needle 31G X 5 MM MISC Use daily with insulin pen 03/15/15   Tonia Ghent, MD  lubiprostone (AMITIZA) 8 MCG capsule TAKE 1 CAPSULE BY MOUTH  DAILY  WITH  BREAKFAST 03/15/15   Tonia Ghent, MD  NON FORMULARY daily. [employer]concepts lancets an d glucometer strips    Historical Provider, MD  omeprazole (PRILOSEC) 40 MG capsule Take 1 capsule (40 mg total) by mouth daily. 09/07/14   Pleas Koch, NP  ondansetron (ZOFRAN) 4 MG tablet Take 1 tablet (4 mg total) by mouth every 6 (six) hours as needed for nausea. 07/31/15   Rod Can, MD  Ophthalmic Irrigation Solution (EYE [place]) 99.05 % SOLN Place 1 drop into both eyes 2 (two) times daily.    Historical Provider, MD  rosuvastatin (CRESTOR) 20 MG tablet Take 1 tablet (20 mg total) by mouth daily. 10/16/14   Dorothy Spark, MD  sitaGLIPtin (JANUVIA) 50 MG tablet Take 1 tablet (50 mg total) by mouth daily. 03/15/15   Tonia Ghent, MD  vancomycin (VANCOCIN) 1 GM/200ML SOLN Inject 200 mLs (1,000 mg total) into the vein daily. 07/31/15   Rod Can, MD   BP 150/59 mmHg  Pulse 70  Temp(Src) 98.3 F (36.8 C) (Oral)  Resp 16  SpO2 97% Physical Exam  Constitutional: He is oriented to person, place, and time. He appears well-developed and well-nourished.  Alert and in no acute distress  HENT:  Head: Normocephalic and atraumatic.  Cardiovascular: Normal rate, regular rhythm and normal heart sounds.  Exam reveals no gallop and no friction rub.   No murmur heard. Pulmonary/Chest: Effort normal and breath sounds normal. No respiratory distress. He has no wheezes. He has no rales. He exhibits no tenderness.  Musculoskeletal: He exhibits no edema.  Neurological: He is alert and oriented to person, place, and time.  Skin: Skin is warm and dry. No  erythema.  Psychiatric: He has a normal mood and affect. His behavior is normal. Judgment and thought content normal.  Nursing note and vitals reviewed.   ED Course  Procedures (including critical care time) Labs Review Labs Reviewed - No data to display  Imaging Review Ir Remove Cv Intralumen Clot  08/02/2015  INDICATION: 82 year old male with an infected knee prosthesis in need of durable central venous access. He recently had a right IJ approach tunneled power PICC line placed on 07/30/2015. Unfortunately, the catheter has clotted and cannot be aspirated or flushed at this time. EXAM: CV CATH INTRALUMEN CLOT MECH REMOVAL MEDICATIONS: None ANESTHESIA/SEDATION: None FLUOROSCOPY TIME:  Fluoroscopy Time: 0 minutes 18 seconds (2 mGy). COMPLICATIONS: None immediate. PROCEDURE: Informed written consent was obtained from the patient after a thorough discussion of the procedural risks, benefits and alternatives. All questions were addressed. Maximal Sterile Barrier Technique was utilized including caps, mask, sterile gowns, sterile gloves, sterile drape, hand hygiene and skin antiseptic. A timeout was performed prior to the initiation of the procedure. A 0.018 inch wire was carefully advanced through the lumen of the power injectable PICC line macerating the internal thrombus. This was successfully performed with relative ease. The wire was then advanced through the second lumen. Using small bore syringes, the catheter was then flushed copiously. The catheter was next test for function. Blood is aspirated easily and the catheter lumens flushed easily. IMPRESSION: Successful salvage of clotted right IJ PICC. The catheter again aspirates and flushes with ease. Signed, Criselda Peaches, MD [facility] Radiology Electronically Signed   By: Jacqulynn Cadet M.D.   On: 08/02/2015 14:35   I have personally reviewed and evaluated these images and lab results as  part of my medical decision-making.   EKG Interpretation None  MDM   Final diagnoses:  Occluded PICC line, initial encounter ([place])   [name] presents for an occluded PICC line. Seen by IR who performed a successful salvage of clotted right IJ PICC. Catheter now aspirates and flushes with ease. Patient with no other complaints at this time. He is aware of follow up instructions.   Ozella Almond Emmajo Bennette, PA-C 08/02/15 [place], MD 08/04/15 [phone]

## 2015-08-03 LAB — ANAEROBIC CULTURE

## 2015-08-04 LAB — TISSUE CULTURE
Culture: UNDETERMINED
Gram Stain: NONE SEEN

## 2015-08-14 ENCOUNTER — Encounter: Payer: Medicare Other | Admitting: Internal Medicine

## 2015-08-23 ENCOUNTER — Telehealth: Payer: Self-pay | Admitting: Family Medicine

## 2015-08-23 ENCOUNTER — Other Ambulatory Visit (INDEPENDENT_AMBULATORY_CARE_PROVIDER_SITE_OTHER): Payer: Medicare Other

## 2015-08-23 DIAGNOSIS — D649 Anemia, unspecified: Secondary | ICD-10-CM

## 2015-08-23 LAB — CBC WITH DIFFERENTIAL/PLATELET
BASOS PCT: 0.7 % (ref 0.0–3.0)
Basophils Absolute: 0.1 10*3/uL (ref 0.0–0.1)
EOS PCT: 4.1 % (ref 0.0–5.0)
Eosinophils Absolute: 0.3 10*3/uL (ref 0.0–0.7)
HCT: 33.7 % — ABNORMAL LOW (ref 39.0–52.0)
HEMOGLOBIN: 10.9 g/dL — AB (ref 13.0–17.0)
LYMPHS ABS: 1.8 10*3/uL (ref 0.7–4.0)
Lymphocytes Relative: 25.5 % (ref 12.0–46.0)
MCHC: 32.2 g/dL (ref 30.0–36.0)
MCV: 85.2 fl (ref 78.0–100.0)
MONO ABS: 0.6 10*3/uL (ref 0.1–1.0)
Monocytes Relative: 8.2 % (ref 3.0–12.0)
NEUTROS PCT: 61.5 % (ref 43.0–77.0)
Neutro Abs: 4.3 10*3/uL (ref 1.4–7.7)
Platelets: 245 10*3/uL (ref 150.0–400.0)
RBC: 3.96 Mil/uL — ABNORMAL LOW (ref 4.22–5.81)
RDW: 19.8 % — AB (ref 11.5–15.5)
WBC: 7 10*3/uL (ref 4.0–10.5)

## 2015-08-23 LAB — IBC PANEL
IRON: 34 ug/dL — AB (ref 42–165)
Saturation Ratios: 9.8 % — ABNORMAL LOW (ref 20.0–50.0)
Transferrin: 247 mg/dL (ref 212.0–360.0)

## 2015-08-23 NOTE — Telephone Encounter (Signed)
Call pt.  Labs done at outside facility with HGB 7.5, collected on 08/20/15.   I didn't order this panel.   Faxed to me in the meantime.  Call pt.  Needs to go to ER if CP, SOB, lightheaded.  O/w needs f/u CBC/iron panel.  Please make sure he is taking iron in the meantime.  Orders are in for labs.  If patient has CP, SOB, etc will likely need transfusion.

## 2015-08-23 NOTE — Telephone Encounter (Signed)
I spoke with patient and notified him of lab results.  He denies any chest pain, SOB or light-headedness.  He is coming to the office this afternoon for repeat labs per your request - appointment scheduled with the lab.

## 2015-08-23 NOTE — Telephone Encounter (Signed)
Noted. Thanks.

## 2015-08-26 ENCOUNTER — Other Ambulatory Visit: Payer: Self-pay | Admitting: Family Medicine

## 2015-08-26 DIAGNOSIS — D509 Iron deficiency anemia, unspecified: Secondary | ICD-10-CM

## 2015-08-26 DIAGNOSIS — E1122 Type 2 diabetes mellitus with diabetic chronic kidney disease: Secondary | ICD-10-CM

## 2015-08-28 ENCOUNTER — Encounter: Payer: Medicare Other | Admitting: Internal Medicine

## 2015-08-29 ENCOUNTER — Ambulatory Visit (INDEPENDENT_AMBULATORY_CARE_PROVIDER_SITE_OTHER): Payer: Medicare Other | Admitting: Internal Medicine

## 2015-08-29 ENCOUNTER — Encounter: Payer: Self-pay | Admitting: Internal Medicine

## 2015-08-29 ENCOUNTER — Telehealth: Payer: Self-pay

## 2015-08-29 DIAGNOSIS — T8454XA Infection and inflammatory reaction due to internal left knee prosthesis, initial encounter: Secondary | ICD-10-CM

## 2015-08-29 MED ORDER — AMOXICILLIN 500 MG PO CAPS: 500.0000 mg | ORAL_CAPSULE | Freq: Two times a day (BID) | ORAL | Status: DC

## 2015-08-29 MED ORDER — VANCOMYCIN HCL IN DEXTROSE 1-5 GM/200ML-% IV SOLN: 1000.0000 mg | INTRAVENOUS | Status: AC

## 2015-08-29 NOTE — Telephone Encounter (Signed)
Called Coretta at [place] and gave Dr. Hale Bogus verbal order to continue vancomycin antibiotic through April 17th. Patient's central line will be pulled at [facility] Radiology department. Rodman Key, LPN

## 2015-08-29 NOTE — Progress Notes (Signed)
[facility]  Patient Active Problem List   Diagnosis Date Noted  . Infection of prosthetic left knee joint ([place]) 07/29/2015    Priority: High  . Coronary artery disease 07/29/2015  . Acute bronchitis 05/14/2015  . Stroke ([place]) 12/24/2014  . Double vision 12/23/2014  . Pacemaker 11/16/2014  . Anemia 09/27/2014  . Gastrointestinal hemorrhage associated with peptic ulcer 09/07/2014  . Atrial fibrillation-CHADS VASC2 = 5 (age, HTN, DM, Vasc) 08/13/2014  . Chest pain 08/11/2014  . Complete heart block ([place]) 08/11/2014  . Troponin level elevated-0.54 08/11/2014  . Acute on chronic renal insufficiency ([place]) 08/11/2014  . S/P CABG x 5 1989. Low risk Myoview 2012 08/11/2014  . Syncope 08/11/2014  . Cough 06/14/2014  . Left foot pain 02/23/2014  . Gouty arthropathy 08/09/2013  . Chronic radicular lumbar pain 12/15/2012  . PVD- s/p bilat CEA- CA dopplers OK Jan 2015 10/06/2012  . Insomnia 09/30/2011  . OTHER SPECIFIED SITES OF SPRAINS AND STRAINS 04/25/2010  . DIVERTICULITIS OF COLON 02/21/2010  . TEMPOROMANDIBULAR JOINT DISORDER 11/22/2009  . DM (diabetes mellitus), type 2 with renal complications ([place]) [id number]  . Chronic kidney disease, stage III (moderate) 07/26/2008  . CHRONIC PROSTATITIS 07/26/2008  . ADVEF, DRUG/MEDICINAL/BIOLOGICAL SUBST NOS 03/16/2007  . Dyslipidemia 11/15/2006  . Essential hypertension 11/15/2006  . GERD 11/15/2006  . Osteoarthritis 11/15/2006    Patient's Medications  New Prescriptions   AMOXICILLIN (AMOXIL) 500 MG CAPSULE    Take 1 capsule (500 mg total) by mouth 2 (two) times daily.  Previous Medications   APIXABAN (ELIQUIS) 2.5 MG TABS TABLET    Take 1 tablet (2.5 mg total) by mouth 2 (two) times daily.   BENAZEPRIL (LOTENSIN) 20 MG TABLET    Take 1 tablet (20 mg total) by mouth daily.   DILTIAZEM (TIAZAC) 360 MG 24 HR CAPSULE    Take 1 capsule (360 mg total) by mouth daily.   FERROUS SULFATE ([employer] IRON) 325  (65 FE) MG TABLET    Take 1 tablet (325 mg total) by mouth 2 (two) times daily with a meal.   FUROSEMIDE (LASIX) 40 MG TABLET    Take 1 tablet (40 mg total) by mouth daily as needed.   GLIPIZIDE (GLUCOTROL) 5 MG TABLET    Take 2 tablets (10 mg total) by mouth daily before breakfast. And 1 tab before supper   HYDROCODONE-ACETAMINOPHEN (NORCO/VICODIN) 5-325 MG TABLET    Take 1-2 tablets by mouth every 4 (four) hours as needed (breakthrough pain).   INSULIN GLARGINE (LANTUS SOLOSTAR) 100 UNIT/ML SOLOSTAR PEN    Inject 5-10 Units into the skin daily at 10 pm.   INSULIN PEN NEEDLE 31G X 5 MM MISC    Use daily with insulin pen   LUBIPROSTONE (AMITIZA) 8 MCG CAPSULE    TAKE 1 CAPSULE BY MOUTH  DAILY WITH  BREAKFAST   NON FORMULARY    daily. careconcepts lancets an d glucometer strips   OMEPRAZOLE (PRILOSEC) 40 MG CAPSULE    Take 1 capsule (40 mg total) by mouth daily.   ONDANSETRON (ZOFRAN) 4 MG TABLET    Take 1 tablet (4 mg total) by mouth every 6 (six) hours as needed for nausea.   OPHTHALMIC IRRIGATION SOLUTION (EYE WASH) 99.05 % SOLN    Place 1 drop into both eyes 2 (two) times daily.   ROSUVASTATIN (CRESTOR) 20 MG TABLET    Take 1 tablet (20 mg total) by mouth daily.  SITAGLIPTIN (JANUVIA) 50 MG TABLET    Take 1 tablet (50 mg total) by mouth daily.  Modified Medications   Modified Medication Previous Medication   VANCOMYCIN (VANCOCIN) 1 GM/200ML SOLN vancomycin (VANCOCIN) 1 GM/200ML SOLN      Inject 200 mLs (1,000 mg total) into the vein daily.    Inject 200 mLs (1,000 mg total) into the vein daily.  Discontinued Medications   No medications on file    Subjective: Mr. Angotti is in with his wife for his hospital follow-up visit. He was hospitalized last month with acute left prosthetic knee infection due to enterococcus. He underwent incision and drainage with poly-exchange. He had a central line placed because of his chronic renal insufficiency and was discharged on IV vancomycin. He is now  completed 31 days of therapy. He is feeling much better. He has resumed most of his normal activities except playing golf and he is able to get around without the aid of his cane almost days. He has been able to cut his Vicodin and half and is only requiring one every 4 hours while awake to control his pain. He has had no problems tolerating his central line or vancomycin.  Review of Systems: Review of Systems  Constitutional:       As noted in history of present illness.    Past Medical History  Diagnosis Date  . GERD (gastroesophageal reflux disease)   . Hyperlipidemia   . Essential hypertension   . Arthritis   . Diverticulosis   . Hemorrhoids   . Colon polyps   . CAD (coronary artery disease)     a. CABG 1989, b. Myoview low risk 2012.  [name] CHB (complete heart block) [facility]) March 2016    a. s/p STJ dual chamber pacemaker 07/2014.  . Osteoporosis   . PAF (paroxysmal atrial fibrillation) [facility]) March 2016  . Chronic diastolic CHF (congestive heart failure) ([place])     a. Dx [id number] - acute diastolic CHF in the setting of CHB.  . Carotid artery occlusion     a. Duplex 10/2014: patent R/L CEA with mild hyperplasia in right surgical bulb - followed by vascular.  . Myocardial infarction [facility]) 1976  and Mar. 19, 2016  . CVA (cerebral infarction)   . Orthostasis   . Anemia   . Complication of anesthesia   . PONV (postoperative nausea and vomiting)   . Presence of permanent cardiac pacemaker   . Shortness of breath dyspnea     due to pain  . Diabetes mellitus     type II  . Chronic kidney disease, stage IV (severe) (HCC)     stage III/IV as of 2015, [name]  . CKD (chronic kidney disease), stage IV ([place])   . Constipation   . Dupuytren's disease     finger right hand contracted    Social History  Substance Use Topics  . Smoking status: Former Smoker -- 1.00 packs/day for 25 years    Types: Cigarettes    Quit date: 01/21/1973  . Smokeless tobacco: Never Used      Comment: began smoking in high school, quit age 89  . Alcohol Use: 0.0 oz/week    0 Standard drinks or equivalent per week     Comment: 1-2 per week    Family History  Problem Relation Age of Onset  . Diabetes    . Cancer    . Heart disease Mother     Before age 64  . Diabetes Mother   .  Varicose Veins Mother   . Heart attack Mother   . Heart attack Father   . Heart disease Father     After age 36  . Hypertension Father   . Colon cancer Neg Hx   . Heart disease Brother     Before age 38  . Hypertension Brother   . Heart attack Brother   . Diabetes Son   . Hypertension Son   . Hypertension Son   . Diabetes Son     Allergies  Allergen Reactions  . Reclast [Zoledronic Acid] Other (See Comments)    Stopped 2015 due to Creatine     Objective: Filed Vitals:   08/29/15 1452  BP: 164/72  Pulse: 82  Temp: 98 F (36.7 C)  TempSrc: Oral  Weight: 187 lb (84.823 kg)   Body mass index is 27.6 kg/(m^2).  Physical Exam  Constitutional: He is oriented to person, place, and time.  He is in very good spirits.  Eyes: Conjunctivae are normal.  Cardiovascular: Normal rate and regular rhythm.   No murmur heard. Pulmonary/Chest: Breath sounds normal.  Right anterior chest central line site looks good.  Abdominal: He exhibits no mass. There is no tenderness.  Musculoskeletal: Normal range of motion.  His left knee incision is healing nicely.  Neurological: He is alert and oriented to person, place, and time.  Skin: No rash noted.  Psychiatric: Mood and affect normal.    Lab Results SED RATE (mm/hr)  Date Value  07/29/2015 85*  06/02/2012 9  09/16/2009 91*   CRP (mg/dL)  Date Value  07/29/2015 20.4*     Problem List Items Addressed This Visit      High   Infection of prosthetic left knee joint ([place])    He is improving on therapy for enterococcal prosthetic knee infection. He will complete IV vancomycin on 09/09/2015. He will then return to interventional  radiology to have a central line removed and he will start oral amoxicillin on 09/10/2015. I plan on a total of 6 months of antibiotic therapy. He will follow-up with me in 6 weeks.      Relevant Medications   vancomycin (VANCOCIN) 1 GM/200ML SOLN   amoxicillin (AMOXIL) 500 MG capsule (Start on 09/10/2015)       Michel Bickers, MD [facility] [phone] pager   [phone] cell 08/29/2015, 3:36 PM

## 2015-08-29 NOTE — Assessment & Plan Note (Signed)
He is improving on therapy for enterococcal prosthetic knee infection. He will complete IV vancomycin on 09/09/2015. He will then return to interventional radiology to have a central line removed and he will start oral amoxicillin on 09/10/2015. I plan on a total of 6 months of antibiotic therapy. He will follow-up with me in 6 weeks.

## 2015-09-09 ENCOUNTER — Other Ambulatory Visit: Payer: Self-pay | Admitting: *Deleted

## 2015-09-09 ENCOUNTER — Other Ambulatory Visit: Payer: Self-pay | Admitting: Internal Medicine

## 2015-09-09 DIAGNOSIS — T8454XA Infection and inflammatory reaction due to internal left knee prosthesis, initial encounter: Secondary | ICD-10-CM

## 2015-09-10 ENCOUNTER — Other Ambulatory Visit: Payer: Self-pay | Admitting: *Deleted

## 2015-09-10 ENCOUNTER — Telehealth: Payer: Self-pay | Admitting: *Deleted

## 2015-09-10 DIAGNOSIS — T8454XA Infection and inflammatory reaction due to internal left knee prosthesis, initial encounter: Secondary | ICD-10-CM

## 2015-09-10 NOTE — Telephone Encounter (Signed)
Called lindsay back to advise her of the appt made to have the patient PICC removed at [place] for 09/11/15 at 930 am. She advised will let the patient know.

## 2015-09-11 ENCOUNTER — Ambulatory Visit (HOSPITAL_COMMUNITY)
Admission: RE | Admit: 2015-09-11 | Discharge: 2015-09-11 | Disposition: A | Discharge status: Home / Self Care | Payer: Medicare Other | Source: Ambulatory Visit | Attending: Internal Medicine | Admitting: Internal Medicine

## 2015-09-11 DIAGNOSIS — T8454XA Infection and inflammatory reaction due to internal left knee prosthesis, initial encounter: Secondary | ICD-10-CM | POA: Diagnosis not present

## 2015-09-11 DIAGNOSIS — X58XXXA Exposure to other specified factors, initial encounter: Secondary | ICD-10-CM | POA: Insufficient documentation

## 2015-09-11 DIAGNOSIS — Z452 Encounter for adjustment and management of vascular access device: Secondary | ICD-10-CM | POA: Insufficient documentation

## 2015-09-11 MED ORDER — LIDOCAINE HCL 1 % IJ SOLN
INTRAMUSCULAR | Status: AC
  Filled 2015-09-11: qty 20

## 2015-09-11 MED ORDER — CHLORHEXIDINE GLUCONATE 4 % EX LIQD
CUTANEOUS | Status: AC
  Administered 2015-09-11: 10:00:00
  Filled 2015-09-11: qty 15

## 2015-09-11 NOTE — Procedures (Signed)
RIJV PICC removal No comp/EBL

## 2015-09-13 ENCOUNTER — Other Ambulatory Visit: Payer: Self-pay | Admitting: Primary Care

## 2015-09-16 ENCOUNTER — Other Ambulatory Visit (INDEPENDENT_AMBULATORY_CARE_PROVIDER_SITE_OTHER): Payer: Medicare Other

## 2015-09-16 DIAGNOSIS — D509 Iron deficiency anemia, unspecified: Secondary | ICD-10-CM | POA: Diagnosis not present

## 2015-09-16 DIAGNOSIS — E1122 Type 2 diabetes mellitus with diabetic chronic kidney disease: Secondary | ICD-10-CM

## 2015-09-16 LAB — CBC WITH DIFFERENTIAL/PLATELET
BASOS ABS: 0.1 10*3/uL (ref 0.0–0.1)
Basophils Relative: 0.6 % (ref 0.0–3.0)
EOS PCT: 2.6 % (ref 0.0–5.0)
Eosinophils Absolute: 0.2 10*3/uL (ref 0.0–0.7)
HEMATOCRIT: 38.6 % — AB (ref 39.0–52.0)
HEMOGLOBIN: 12.6 g/dL — AB (ref 13.0–17.0)
LYMPHS PCT: 28.2 % (ref 12.0–46.0)
Lymphs Abs: 2.4 10*3/uL (ref 0.7–4.0)
MCHC: 32.8 g/dL (ref 30.0–36.0)
MCV: 84.8 fl (ref 78.0–100.0)
MONOS PCT: 7.2 % (ref 3.0–12.0)
Monocytes Absolute: 0.6 10*3/uL (ref 0.1–1.0)
NEUTROS PCT: 61.4 % (ref 43.0–77.0)
Neutro Abs: 5.3 10*3/uL (ref 1.4–7.7)
Platelets: 189 10*3/uL (ref 150.0–400.0)
RBC: 4.55 Mil/uL (ref 4.22–5.81)
RDW: 18.4 % — ABNORMAL HIGH (ref 11.5–15.5)
WBC: 8.6 10*3/uL (ref 4.0–10.5)

## 2015-09-16 LAB — IBC PANEL
Iron: 41 ug/dL — ABNORMAL LOW (ref 42–165)
Saturation Ratios: 11.5 % — ABNORMAL LOW (ref 20.0–50.0)
Transferrin: 255 mg/dL (ref 212.0–360.0)

## 2015-09-16 LAB — HEMOGLOBIN A1C: HEMOGLOBIN A1C: 6.4 % (ref 4.6–6.5)

## 2015-09-23 ENCOUNTER — Encounter: Payer: Self-pay | Admitting: Family Medicine

## 2015-09-23 ENCOUNTER — Ambulatory Visit (INDEPENDENT_AMBULATORY_CARE_PROVIDER_SITE_OTHER): Payer: Medicare Other | Admitting: Family Medicine

## 2015-09-23 VITALS — BP 142/60 | HR 82 | Temp 97.8°F | Wt 182.8 lb

## 2015-09-23 DIAGNOSIS — E1122 Type 2 diabetes mellitus with diabetic chronic kidney disease: Secondary | ICD-10-CM | POA: Diagnosis not present

## 2015-09-23 DIAGNOSIS — T8454XA Infection and inflammatory reaction due to internal left knee prosthesis, initial encounter: Secondary | ICD-10-CM | POA: Diagnosis not present

## 2015-09-23 DIAGNOSIS — D649 Anemia, unspecified: Secondary | ICD-10-CM | POA: Diagnosis not present

## 2015-09-23 MED ORDER — FERROUS SULFATE 325 (65 FE) MG PO TABS: ORAL_TABLET | ORAL | Status: DC

## 2015-09-23 MED ORDER — GLIPIZIDE 5 MG PO TABS: 5.0000 mg | ORAL_TABLET | Freq: Every day | ORAL | Status: DC

## 2015-09-23 NOTE — Assessment & Plan Note (Signed)
Improved, continue BID iron M-->F.  D/w pt.  He agrees.

## 2015-09-23 NOTE — Assessment & Plan Note (Signed)
Per ortho/ID.  App help.  Doing well clinically, given the recent events.

## 2015-09-23 NOTE — Patient Instructions (Signed)
See med list below.   Recheck labs before a visit in about 3 months, sooner if needed.   Take care.  Glad to see you.  If you still have low sugars, then stop the glipizide and update me.

## 2015-09-23 NOTE — Progress Notes (Signed)
Pre visit review using our clinic review tool, if applicable. No additional management support is needed unless otherwise documented below in the visit note.  Diabetes:  Using medications without difficulties: yes, since cutting back on glipizide dose.   Hypoglycemic episodes: no Hyperglycemic episodes: no Feet problems: no Blood Sugars averaging:usually 100-110 recently.  eye exam within last year: yes A1c and CBC improved, d/w pt.   Still on iron, d/w pt.  No ADE but black stools as expected.    Still on ABX by mouth, port is out after L knee infected hardware.  His pain is improved.  No fevers.  Weight loss after injection noted.  He is doing home exercises.    Meds, vitals, and allergies reviewed.   ROS: Per HPI unless specifically indicated in ROS section   GEN: nad, alert and oriented HEENT: mucous membranes moist NECK: supple w/o LA CV: rrr. PULM: ctab, no inc wob EXT: no edema SKIN: no acute rash, L knee not red.   Diabetic foot exam: Normal inspection No skin breakdown No calluses  Normal DP pulses Normal sensation to light touch and monofilament Nails normal

## 2015-09-23 NOTE — Assessment & Plan Note (Signed)
Controlled, cut back on glipizide, recheck in about 3 months.  Weight loss likely leading to dec in sugar, weight loss likely from recent illness but not another ominous dx.  He thinks his weight will gradually inc.  D/w pt about avoiding high carb foods.  Okay for outpatient f/u.

## 2015-10-01 ENCOUNTER — Encounter: Payer: Self-pay | Admitting: Internal Medicine

## 2015-10-01 ENCOUNTER — Ambulatory Visit (INDEPENDENT_AMBULATORY_CARE_PROVIDER_SITE_OTHER): Payer: Medicare Other | Admitting: Internal Medicine

## 2015-10-01 VITALS — BP 150/64 | HR 82 | Ht 70.0 in | Wt 182.8 lb

## 2015-10-01 DIAGNOSIS — I480 Paroxysmal atrial fibrillation: Secondary | ICD-10-CM | POA: Diagnosis not present

## 2015-10-01 DIAGNOSIS — I5032 Chronic diastolic (congestive) heart failure: Secondary | ICD-10-CM | POA: Diagnosis not present

## 2015-10-01 LAB — CUP PACEART INCLINIC DEVICE CHECK
Battery Remaining Longevity: 124.8
Battery Voltage: 3.01 V
Implantable Lead Implant Date: 20160321
Implantable Lead Location: 753859
Lead Channel Impedance Value: 362.5 Ohm
Lead Channel Pacing Threshold Amplitude: 0.5 V
Lead Channel Pacing Threshold Amplitude: 0.5 V
Lead Channel Pacing Threshold Amplitude: 0.5 V
Lead Channel Sensing Intrinsic Amplitude: 1.8 mV
Lead Channel Setting Pacing Pulse Width: 0.5 ms
MDC IDC LEAD IMPLANT DT: 20160321
MDC IDC LEAD LOCATION: 753860
MDC IDC MSMT LEADCHNL RA PACING THRESHOLD PULSEWIDTH: 0.5 ms
MDC IDC MSMT LEADCHNL RA PACING THRESHOLD PULSEWIDTH: 0.5 ms
MDC IDC MSMT LEADCHNL RV IMPEDANCE VALUE: 587.5 Ohm
MDC IDC MSMT LEADCHNL RV PACING THRESHOLD PULSEWIDTH: 0.5 ms
MDC IDC MSMT LEADCHNL RV SENSING INTR AMPL: 12 mV
MDC IDC PG SERIAL: 7734710
MDC IDC SESS DTM: 20170509101133
MDC IDC SET LEADCHNL RA PACING AMPLITUDE: 2 V
MDC IDC SET LEADCHNL RV PACING AMPLITUDE: 0.75 V
MDC IDC SET LEADCHNL RV SENSING SENSITIVITY: 4 mV
MDC IDC STAT BRADY RA PERCENT PACED: 32 %
MDC IDC STAT BRADY RV PERCENT PACED: 99.82 %
Pulse Gen Model: 2240

## 2015-10-01 NOTE — Progress Notes (Signed)
HPI [name] returns today for followup. He is a pleasant 82 yo man with a h/o diastolic heart failure, complete heart block, s/p PPM insertion. He denies chest pain or sob. He has had a problem with his prosthetic knee getting infected with Staph, s/p I & D. He denies pain in his knee and denies fever or chills. No syncope. No night sweats. His knee pain has resolved. Allergies  Allergen Reactions  . Reclast [Zoledronic Acid] Other (See Comments)    Stopped 2015 due to Creatine      Current Outpatient Prescriptions  Medication Sig Dispense Refill  . amoxicillin (AMOXIL) 500 MG capsule Take 1 capsule (500 mg total) by mouth 2 (two) times daily. 60 capsule 4  . apixaban (ELIQUIS) 2.5 MG TABS tablet Take 1 tablet (2.5 mg total) by mouth 2 (two) times daily. 90 tablet 3  . benazepril (LOTENSIN) 20 MG tablet Take 1 tablet (20 mg total) by mouth daily. 90 tablet 3  . diltiazem (TIAZAC) 360 MG 24 hr capsule Take 1 capsule (360 mg total) by mouth daily. 90 capsule 3  . ferrous sulfate (CVS IRON) 325 (65 FE) MG tablet Twice a day on Mon-Fri, skip weekends    . furosemide (LASIX) 40 MG tablet Take 40 mg by mouth daily.    [name] glipiZIDE (GLUCOTROL) 5 MG tablet Take 1 tablet (5 mg total) by mouth daily before breakfast.    . lubiprostone (AMITIZA) 8 MCG capsule TAKE 1 CAPSULE BY MOUTH  DAILY WITH  BREAKFAST 90 capsule 3  . omeprazole (PRILOSEC) 40 MG capsule TAKE ONE CAPSULE BY MOUTH DAILY 30 capsule 11  . Ophthalmic Irrigation Solution (EYE WASH) 99.05 % SOLN Place 1 drop into both eyes 2 (two) times daily.    [name] oxyCODONE (ROXICODONE) 15 MG immediate release tablet     . rosuvastatin (CRESTOR) 20 MG tablet Take 1 tablet (20 mg total) by mouth daily. 90 tablet 3  . sitaGLIPtin (JANUVIA) 50 MG tablet Take 1 tablet (50 mg total) by mouth daily. 90 tablet 3   No current facility-administered medications for this visit.     Past Medical History  Diagnosis Date  . GERD (gastroesophageal reflux  disease)   . Hyperlipidemia   . Essential hypertension   . Arthritis   . Diverticulosis   . Hemorrhoids   . Colon polyps   . CAD (coronary artery disease)     a. CABG 1989, b. Myoview low risk 2012.  [name] CHB (complete heart block) [facility]) March 2016    a. s/p STJ dual chamber pacemaker 07/2014.  . Osteoporosis   . PAF (paroxysmal atrial fibrillation) [facility]) March 2016  . Chronic diastolic CHF (congestive heart failure) ([place])     a. Dx [id number] - acute diastolic CHF in the setting of CHB.  . Carotid artery occlusion     a. Duplex 10/2014: patent R/L CEA with mild hyperplasia in right surgical bulb - followed by vascular.  . Myocardial infarction [facility]) 1976  and Mar. 19, 2016  . CVA (cerebral infarction)   . Orthostasis   . Anemia   . Complication of anesthesia   . PONV (postoperative nausea and vomiting)   . Presence of permanent cardiac pacemaker   . Shortness of breath dyspnea     due to pain  . Diabetes mellitus     type II  . Chronic kidney disease, stage IV (severe) (HCC)     stage III/IV as of 2015, [name]  .  CKD (chronic kidney disease), stage IV ([place])   . Constipation   . Dupuytren's disease     finger right hand contracted    ROS:   All systems reviewed and negative except as noted in the HPI.   Past Surgical History  Procedure Laterality Date  . Coronary artery bypass graft  1989  . Cholecystectomy    . Total knee arthroplasty      bilateral  . Tonsillectomy    . Carotid endarterectomy  12/31/10    Right  . Carotid endarterectomy  02/09/11    left  . Joint replacement      bilat. knees  . Cardiac catheterization  08/11/2014    Procedure: TEMPORARY PACEMAKER;  Surgeon: [name], MD;  Location[facility] CATH LAB;  Service: Cardiovascular;;  . Permanent pacemaker insertion N/A 08/13/2014    STJ dual chamber pacemaker implanted by [name] for CHB  . Eye surgery Bilateral     Cataract with implants  . I&d knee with poly exchange Left 07/29/2015     Procedure: IRRIGATION AND DEBRIDEMENT LEFT KNEE WITH POLY EXCHANGE;  Surgeon: Rod Can, MD;  Location: [facility];  Service: Orthopedics;  Laterality: Left;     Family History  Problem Relation Age of Onset  . Diabetes    . Cancer    . Heart disease Mother     Before age 66  . Diabetes Mother   . Varicose Veins Mother   . Heart attack Mother   . Heart attack Father   . Heart disease Father     After age 34  . Hypertension Father   . Colon cancer Neg Hx   . Heart disease Brother     Before age 106  . Hypertension Brother   . Heart attack Brother   . Diabetes Son   . Hypertension Son   . Hypertension Son   . Diabetes Son      Social History   Social History  . Marital Status: Married    Spouse Name: N/A  . Number of Children: N/A  . Years of Education: N/A   Occupational History  . retired    Social History Main Topics  . Smoking status: Former Smoker -- 1.00 packs/day for 25 years    Types: Cigarettes    Quit date: 01/21/1973  . Smokeless tobacco: Never Used     Comment: began smoking in high school, quit age 81  . Alcohol Use: 0.0 oz/week    0 Standard drinks or equivalent per week     Comment: 1-2 per week  . Drug Use: No  . Sexual Activity: Not on file   Other Topics Concern  . 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  Remarried 1998.     3 sons     BP 150/64 mmHg  Pulse 82  Ht 5\' 10"  (1.778 m)  Wt 182 lb 12.8 oz (82.918 kg)  BMI 26.23 kg/m2  SpO2 95%  Physical Exam:  elderly appearing 82 yo man, NAD HEENT: Unremarkable Neck:  7cm JVD, no thyromegally Lymphatics:  No adenopathy Back:  No CVA tenderness Lungs:  Clear with no wheezes HEART:  Regular rate rhythm, no murmurs, no rubs, no clicks Abd:  soft, positive bowel sounds, no organomegally, no rebound, no guarding Ext:  2 plus pulses, no edema, no cyanosis, no clubbing Skin:  No rashes no nodules  Neuro:  CN  II through XII intact, motor grossly intact  DEVICE  Normal device function.  See PaceArt for details.   Assess/Plan: 1. Chronic diastolic heart failure - he is class 2. He will continue his current meds. 2. PPM - his [employer]. Jude DDD PM is working normally. Will recheck in several months. 3. PAF - he is mostly NSR. He will continue his Eliquis. 4. Staph infection - he has been debrided and has no systemic symptoms.  Mikle Bosworth.D.

## 2015-10-01 NOTE — Patient Instructions (Signed)
Medication Instructions:  Your physician recommends that you continue on your current medications as directed. Please refer to the Current Medication list given to you today.   Labwork: None ordered   Testing/Procedures: None ordered   Follow-Up: Your physician wants you to follow-up in: 12 months with Dr Knox Saliva will receive a reminder letter in the mail two months in advance. If you don't receive a letter, please call our office to schedule the follow-up appointment.  Remote monitoring is used to monitor your Pacemaker from home. This monitoring reduces the number of office visits required to check your device to one time per year. It allows [place] to keep an eye on the functioning of your device to ensure it is working properly. You are scheduled for a device check from home on 12/31/15. You may send your transmission at any time that day. If you have a wireless device, the transmission will be sent automatically. After your physician reviews your transmission, you will receive a postcard with your next transmission date.     Any Other Special Instructions Will Be Listed Below (If Applicable).     If you need a refill on your cardiac medications before your next appointment, please call your pharmacy.

## 2015-10-17 ENCOUNTER — Encounter: Payer: Self-pay | Admitting: Internal Medicine

## 2015-10-17 ENCOUNTER — Ambulatory Visit (INDEPENDENT_AMBULATORY_CARE_PROVIDER_SITE_OTHER): Payer: Medicare Other | Admitting: Internal Medicine

## 2015-10-17 DIAGNOSIS — T8454XA Infection and inflammatory reaction due to internal left knee prosthesis, initial encounter: Secondary | ICD-10-CM

## 2015-10-17 LAB — COMPREHENSIVE METABOLIC PANEL
ALK PHOS: 79 U/L (ref 40–115)
ALT: 13 U/L (ref 9–46)
AST: 18 U/L (ref 10–35)
Albumin: 3.9 g/dL (ref 3.6–5.1)
BILIRUBIN TOTAL: 0.4 mg/dL (ref 0.2–1.2)
BUN: 30 mg/dL — AB (ref 7–25)
CALCIUM: 9.1 mg/dL (ref 8.6–10.3)
CO2: 22 mmol/L (ref 20–31)
Chloride: 106 mmol/L (ref 98–110)
Creat: 2.26 mg/dL — ABNORMAL HIGH (ref 0.70–1.11)
GLUCOSE: 185 mg/dL — AB (ref 65–99)
Potassium: 4.6 mmol/L (ref 3.5–5.3)
SODIUM: 139 mmol/L (ref 135–146)
Total Protein: 6.4 g/dL (ref 6.1–8.1)

## 2015-10-17 LAB — CBC
HEMATOCRIT: 38.7 % (ref 38.5–50.0)
HEMOGLOBIN: 12.4 g/dL — AB (ref 13.2–17.1)
MCH: 28 pg (ref 27.0–33.0)
MCHC: 32 g/dL (ref 32.0–36.0)
MCV: 87.4 fL (ref 80.0–100.0)
MPV: 10.6 fL (ref 7.5–12.5)
Platelets: 165 10*3/uL (ref 140–400)
RBC: 4.43 MIL/uL (ref 4.20–5.80)
RDW: 16.4 % — AB (ref 11.0–15.0)
WBC: 9.1 10*3/uL (ref 3.8–10.8)

## 2015-10-17 LAB — C-REACTIVE PROTEIN

## 2015-10-17 NOTE — Assessment & Plan Note (Signed)
He is still bothered by left knee pain but otherwise he seems to be improving on antibiotic therapy for enterococcal septic knee infection. I will recheck his sedimentation rate and C-reactive protein today and plan on 3 more months of antibiotic therapy.

## 2015-10-17 NOTE — Progress Notes (Signed)
[facility]  Patient Active Problem List   Diagnosis Date Noted  . Infection of prosthetic left knee joint ([place]) 07/29/2015    Priority: High  . Coronary artery disease 07/29/2015  . Stroke ([place]) 12/24/2014  . Double vision 12/23/2014  . Pacemaker 11/16/2014  . Anemia 09/27/2014  . Gastrointestinal hemorrhage associated with peptic ulcer 09/07/2014  . Atrial fibrillation-CHADS VASC2 = 5 (age, HTN, DM, Vasc) 08/13/2014  . Chest pain 08/11/2014  . Complete heart block ([place]) 08/11/2014  . Troponin level elevated-0.54 08/11/2014  . Acute on chronic renal insufficiency ([place]) 08/11/2014  . S/P CABG x 5 1989. Low risk Myoview 2012 08/11/2014  . Syncope 08/11/2014  . Cough 06/14/2014  . Left foot pain 02/23/2014  . Gouty arthropathy 08/09/2013  . Chronic radicular lumbar pain 12/15/2012  . PVD- s/p bilat CEA- CA dopplers OK Jan 2015 10/06/2012  . Insomnia 09/30/2011  . OTHER SPECIFIED SITES OF SPRAINS AND STRAINS 04/25/2010  . DIVERTICULITIS OF COLON 02/21/2010  . TEMPOROMANDIBULAR JOINT DISORDER 11/22/2009  . DM (diabetes mellitus), type 2 with renal complications ([place]) [id number]  . Chronic kidney disease, stage III (moderate) 07/26/2008  . CHRONIC PROSTATITIS 07/26/2008  . ADVEF, DRUG/MEDICINAL/BIOLOGICAL SUBST NOS 03/16/2007  . Dyslipidemia 11/15/2006  . Essential hypertension 11/15/2006  . GERD 11/15/2006  . Osteoarthritis 11/15/2006    Patient's Medications  New Prescriptions   No medications on file  Previous Medications   AMOXICILLIN (AMOXIL) 500 MG CAPSULE    Take 1 capsule (500 mg total) by mouth 2 (two) times daily.   APIXABAN (ELIQUIS) 2.5 MG TABS TABLET    Take 1 tablet (2.5 mg total) by mouth 2 (two) times daily.   BENAZEPRIL (LOTENSIN) 20 MG TABLET    Take 1 tablet (20 mg total) by mouth daily.   DILTIAZEM (TIAZAC) 360 MG 24 HR CAPSULE    Take 1 capsule (360 mg total) by mouth daily.   FERROUS SULFATE ([facility] IRON) 325 (65 FE)  MG TABLET    Twice a day on Mon-Fri, skip weekends   FUROSEMIDE (LASIX) 40 MG TABLET    Take 40 mg by mouth daily.   GLIPIZIDE (GLUCOTROL) 5 MG TABLET    Take 1 tablet (5 mg total) by mouth daily before breakfast.   LUBIPROSTONE (AMITIZA) 8 MCG CAPSULE    TAKE 1 CAPSULE BY MOUTH  DAILY WITH  BREAKFAST   OMEPRAZOLE (PRILOSEC) 40 MG CAPSULE    TAKE ONE CAPSULE BY MOUTH DAILY   OPHTHALMIC IRRIGATION SOLUTION (EYE WASH) 99.05 % SOLN    Place 1 drop into both eyes 2 (two) times daily.   OXYCODONE (ROXICODONE) 15 MG IMMEDIATE RELEASE TABLET       ROSUVASTATIN (CRESTOR) 20 MG TABLET    Take 1 tablet (20 mg total) by mouth daily.   SITAGLIPTIN (JANUVIA) 50 MG TABLET    Take 1 tablet (50 mg total) by mouth daily.  Modified Medications   No medications on file  Discontinued Medications   No medications on file    Subjective: Mr. Damasco is in for his routine follow-up visit. He is now completed 80 days of total antibiotic therapy for enterococcal infection of his left prosthetic knee. He has been on amoxicillin for the past 6 weeks. He has had no problems tolerating the amoxicillin. He continues to have moderately severe left knee pain. He describes it as stabbing. It is worse when he lays down to sleep at night and when  he is up walking. He has been trying to come off of the oxycodone but still requires about 2 doses daily to keep his pain under control. He has not noted any new swelling, redness or increased warmth in his knee since his last visit. He has not had any fever, chills or sweats.  Review of Systems: Review of Systems  Constitutional: Positive for malaise/fatigue. Negative for fever, chills, weight loss and diaphoresis.  Respiratory: Negative for cough, sputum production and shortness of breath.   Cardiovascular: Negative for chest pain.  Gastrointestinal: Positive for constipation. Negative for nausea, vomiting, abdominal pain and diarrhea.  Musculoskeletal: Positive for joint pain. Negative  for myalgias.  Skin: Negative for rash.  Neurological: Negative for headaches.    Past Medical History  Diagnosis Date  . GERD (gastroesophageal reflux disease)   . Hyperlipidemia   . Essential hypertension   . Arthritis   . Diverticulosis   . Hemorrhoids   . Colon polyps   . CAD (coronary artery disease)     a. CABG 1989, b. Myoview low risk 2012.  [name] CHB (complete heart block) [facility]) March 2016    a. s/p STJ dual chamber pacemaker 07/2014.  . Osteoporosis   . PAF (paroxysmal atrial fibrillation) [facility]) March 2016  . Chronic diastolic CHF (congestive heart failure) ([place])     a. Dx [id number] - acute diastolic CHF in the setting of CHB.  . Carotid artery occlusion     a. Duplex 10/2014: patent R/L CEA with mild hyperplasia in right surgical bulb - followed by vascular.  . Myocardial infarction [facility]) 1976  and Mar. 19, 2016  . CVA (cerebral infarction)   . Orthostasis   . Anemia   . Complication of anesthesia   . PONV (postoperative nausea and vomiting)   . Presence of permanent cardiac pacemaker   . Shortness of breath dyspnea     due to pain  . Diabetes mellitus     type II  . Chronic kidney disease, stage IV (severe) (HCC)     stage III/IV as of 2015, [name]  . CKD (chronic kidney disease), stage IV ([place])   . Constipation   . Dupuytren's disease     finger right hand contracted    Social History  Substance Use Topics  . Smoking status: Former Smoker -- 1.00 packs/day for 25 years    Types: Cigarettes    Quit date: 01/21/1973  . Smokeless tobacco: Never Used     Comment: began smoking in high school, quit age 8  . Alcohol Use: 0.0 oz/week    0 Standard drinks or equivalent per week     Comment: 1-2 per week    Family History  Problem Relation Age of Onset  . Diabetes    . Cancer    . Heart disease Mother     Before age 41  . Diabetes Mother   . Varicose Veins Mother   . Heart attack Mother   . Heart attack Father   . Heart disease Father      After age 87  . Hypertension Father   . Colon cancer Neg Hx   . Heart disease Brother     Before age 81  . Hypertension Brother   . Heart attack Brother   . Diabetes Son   . Hypertension Son   . Hypertension Son   . Diabetes Son     Allergies  Allergen Reactions  . Reclast [Zoledronic Acid] Other (See Comments)  Stopped 2015 due to Creatine     Objective: Filed Vitals:   10/17/15 0952  BP: 148/70  Pulse: 67  Temp: 97.7 F (36.5 C)  TempSrc: Oral  Weight: 181 lb 12 oz (82.441 kg)   Body mass index is 26.08 kg/(m^2).  Physical Exam  Constitutional:  He is well dressed and in good spirits. He is accompanied by his wife.  Musculoskeletal:  His left knee incision is well-healed. He has some slight, diffuse swelling and minimal increased warmth compared to his right knee. He has good range of motion without pain.    Lab Results    Problem List Items Addressed This Visit      High   Infection of prosthetic left knee joint ([place])    He is still bothered by left knee pain but otherwise he seems to be improving on antibiotic therapy for enterococcal septic knee infection. I will recheck his sedimentation rate and C-reactive protein today and plan on 3 more months of antibiotic therapy.      Relevant Orders   C-reactive protein   Sedimentation rate   CBC   Comprehensive metabolic panel       Michel Bickers, MD [facility] for [place] [phone] pager   [phone] cell 10/17/2015, 10:23 AM

## 2015-10-18 LAB — SEDIMENTATION RATE: SED RATE: 6 mm/h (ref 0–20)

## 2015-10-25 ENCOUNTER — Encounter: Payer: Self-pay | Admitting: Family

## 2015-10-31 ENCOUNTER — Ambulatory Visit (INDEPENDENT_AMBULATORY_CARE_PROVIDER_SITE_OTHER)
Admission: RE | Admit: 2015-10-31 | Discharge: 2015-10-31 | Disposition: A | Discharge status: Home / Self Care | Payer: Medicare Other | Source: Ambulatory Visit | Attending: Family | Admitting: Family

## 2015-10-31 ENCOUNTER — Ambulatory Visit (HOSPITAL_COMMUNITY)
Admission: RE | Admit: 2015-10-31 | Discharge: 2015-10-31 | Disposition: A | Discharge status: Home / Self Care | Payer: Medicare Other | Source: Ambulatory Visit | Attending: Family | Admitting: Family

## 2015-10-31 ENCOUNTER — Ambulatory Visit (INDEPENDENT_AMBULATORY_CARE_PROVIDER_SITE_OTHER): Payer: Medicare Other | Admitting: Family

## 2015-10-31 ENCOUNTER — Encounter: Payer: Self-pay | Admitting: Family

## 2015-10-31 VITALS — BP 137/63 | HR 66 | Ht 70.0 in | Wt 183.2 lb

## 2015-10-31 DIAGNOSIS — I2510 Atherosclerotic heart disease of native coronary artery without angina pectoris: Secondary | ICD-10-CM | POA: Diagnosis not present

## 2015-10-31 DIAGNOSIS — I130 Hypertensive heart and chronic kidney disease with heart failure and stage 1 through stage 4 chronic kidney disease, or unspecified chronic kidney disease: Secondary | ICD-10-CM | POA: Insufficient documentation

## 2015-10-31 DIAGNOSIS — E785 Hyperlipidemia, unspecified: Secondary | ICD-10-CM | POA: Diagnosis not present

## 2015-10-31 DIAGNOSIS — I739 Peripheral vascular disease, unspecified: Secondary | ICD-10-CM | POA: Insufficient documentation

## 2015-10-31 DIAGNOSIS — I6523 Occlusion and stenosis of bilateral carotid arteries: Secondary | ICD-10-CM | POA: Diagnosis not present

## 2015-10-31 DIAGNOSIS — R0989 Other specified symptoms and signs involving the circulatory and respiratory systems: Secondary | ICD-10-CM

## 2015-10-31 DIAGNOSIS — Z9889 Other specified postprocedural states: Secondary | ICD-10-CM

## 2015-10-31 DIAGNOSIS — I5032 Chronic diastolic (congestive) heart failure: Secondary | ICD-10-CM | POA: Insufficient documentation

## 2015-10-31 DIAGNOSIS — E1122 Type 2 diabetes mellitus with diabetic chronic kidney disease: Secondary | ICD-10-CM | POA: Insufficient documentation

## 2015-10-31 DIAGNOSIS — N184 Chronic kidney disease, stage 4 (severe): Secondary | ICD-10-CM | POA: Diagnosis not present

## 2015-10-31 DIAGNOSIS — Z87891 Personal history of nicotine dependence: Secondary | ICD-10-CM | POA: Insufficient documentation

## 2015-10-31 DIAGNOSIS — Z48812 Encounter for surgical aftercare following surgery on the circulatory system: Secondary | ICD-10-CM | POA: Insufficient documentation

## 2015-10-31 DIAGNOSIS — K219 Gastro-esophageal reflux disease without esophagitis: Secondary | ICD-10-CM | POA: Diagnosis not present

## 2015-10-31 LAB — VAS US CAROTID
LCCADDIAS: 13 cm/s
LCCADSYS: 118 cm/s
LCCAPSYS: 132 cm/s
LEFT ECA DIAS: -5 cm/s
LEFT VERTEBRAL DIAS: 9 cm/s
LICAPSYS: -137 cm/s
Left CCA prox dias: 13 cm/s
Left ICA dist dias: -18 cm/s
Left ICA dist sys: -110 cm/s
Left ICA prox dias: -27 cm/s
RCCADSYS: -132 cm/s
RIGHT CCA MID DIAS: 23 cm/s
RIGHT ECA DIAS: -3 cm/s
RIGHT VERTEBRAL DIAS: -15 cm/s
Right CCA prox dias: 12 cm/s
Right CCA prox sys: 97 cm/s

## 2015-10-31 NOTE — Progress Notes (Signed)
Chief Complaint: Follow up Extracranial Carotid Artery Stenosis   History of Present Illness  Dean Murphy is a 82 y.o. male patient of Dr. Oneida Alar who presents for follow-up s/p bilateral carotid endarterectomy in 2012. He is on Aspirin for antiplatelet therapy. His atherosclerotic risk factors remain diabetes, elevated cholesterol, hypertension and coronary artery disease. These are all currently stable and followed by his primary care physician. He denies any new neurologic events including amaurosis, numbness, or weakness.    He is physically active.  He does not seem to have claudication symptoms, denies non healing wounds. He does have a tired feeling in his low back and hips about noon every day, then takes a nap. He does yard work in the morning.   He had a staph infection in his left knee in March 2017, had I&D of this, was on IV antibx for several weeks, he continues po antibx  He had an MI March 2016, had pacemaker inserted at that time. He had one previous MI in 1976, 5 vessel CABG in 1989.   Pt reports tingling in the toes of both feet.  The patient reports history of TIA , amaurosis fugax, in his left eye that last about 5 minutes; this occurred before his first CEA. The patient denies a history of unilateral facial drooping, denies a history of hemiplegia, and denies a history of receptive or expressive aphasia.   Pt Diabetic: yes, A1C in April 2017 was 6.4, (review of records), in good control Pt smoker: former smoker, quit about 1974  Pt meds include: Statin : yes ASA: no Other anticoagulants/antiplatelets: Elliquis since pacemaker insertion, stopped for a short period when he had GI bleeding   Past Medical History  Diagnosis Date  . GERD (gastroesophageal reflux disease)   . Hyperlipidemia   . Essential hypertension   . Arthritis   . Diverticulosis   . Hemorrhoids   . Colon polyps   . CAD (coronary artery disease)     a. CABG 1989, b. Myoview low  risk 2012.  [name] CHB (complete heart block) [facility]) March 2016    a. s/p STJ dual chamber pacemaker 07/2014.  . Osteoporosis   . PAF (paroxysmal atrial fibrillation) [facility]) March 2016  . Chronic diastolic CHF (congestive heart failure) ([place])     a. Dx [id number] - acute diastolic CHF in the setting of CHB.  . Carotid artery occlusion     a. Duplex 10/2014: patent R/L CEA with mild hyperplasia in right surgical bulb - followed by vascular.  . Myocardial infarction [facility]) 1976  and Mar. 19, 2016  . CVA (cerebral infarction)   . Orthostasis   . Anemia   . Complication of anesthesia   . PONV (postoperative nausea and vomiting)   . Presence of permanent cardiac pacemaker   . Shortness of breath dyspnea     due to pain  . Diabetes mellitus     type II  . Chronic kidney disease, stage IV (severe) (HCC)     stage III/IV as of 2015, [name]  . CKD (chronic kidney disease), stage IV ([place])   . Constipation   . Dupuytren's disease     finger right hand contracted    Social History Social History  Substance Use Topics  . Smoking status: Former Smoker -- 1.00 packs/day for 25 years    Types: Cigarettes    Quit date: 01/21/1973  . Smokeless tobacco: Never Used     Comment: began smoking in high school,  quit age 47  . Alcohol Use: 0.0 oz/week    0 Standard drinks or equivalent per week     Comment: 1-2 per week    Family History Family History  Problem Relation Age of Onset  . Diabetes    . Cancer    . Heart disease Mother     Before age 28  . Diabetes Mother   . Varicose Veins Mother   . Heart attack Mother   . Heart attack Father   . Heart disease Father     After age 32  . Hypertension Father   . Colon cancer Neg Hx   . Heart disease Brother     Before age 72  . Hypertension Brother   . Heart attack Brother   . Diabetes Son   . Hypertension Son   . Hypertension Son   . Diabetes Son     Surgical History Past Surgical History  Procedure Laterality Date  . Coronary  artery bypass graft  1989  . Cholecystectomy    . Total knee arthroplasty      bilateral  . Tonsillectomy    . Carotid endarterectomy  12/31/10    Right  . Carotid endarterectomy  02/09/11    left  . Joint replacement      bilat. knees  . Cardiac catheterization  08/11/2014    Procedure: TEMPORARY PACEMAKER;  Surgeon: [name], MD;  [facility] CATH LAB;  Service: Cardiovascular;;  . Permanent pacemaker insertion N/A 08/13/2014    STJ dual chamber pacemaker implanted by [name] for CHB  . Eye surgery Bilateral     Cataract with implants  . I&d knee with poly exchange Left 07/29/2015    Procedure: IRRIGATION AND DEBRIDEMENT LEFT KNEE WITH POLY EXCHANGE;  Surgeon: Rod Can, MD;  Location: [place];  Service: Orthopedics;  Laterality: Left;    Allergies  Allergen Reactions  . Reclast [Zoledronic Acid] Other (See Comments)    Stopped 2015 due to Creatine     Current Outpatient Prescriptions  Medication Sig Dispense Refill  . amoxicillin (AMOXIL) 500 MG capsule Take 1 capsule (500 mg total) by mouth 2 (two) times daily. 60 capsule 4  . apixaban (ELIQUIS) 2.5 MG TABS tablet Take 1 tablet (2.5 mg total) by mouth 2 (two) times daily. 90 tablet 3  . benazepril (LOTENSIN) 20 MG tablet Take 1 tablet (20 mg total) by mouth daily. 90 tablet 3  . diltiazem (TIAZAC) 360 MG 24 hr capsule Take 1 capsule (360 mg total) by mouth daily. 90 capsule 3  . ferrous sulfate ([facility] IRON) 325 (65 FE) MG tablet Twice a day on Mon-Fri, skip weekends    . furosemide (LASIX) 40 MG tablet Take 40 mg by mouth daily.    [name] glipiZIDE (GLUCOTROL) 5 MG tablet Take 1 tablet (5 mg total) by mouth daily before breakfast.    . lubiprostone (AMITIZA) 8 MCG capsule TAKE 1 CAPSULE BY MOUTH  DAILY WITH  BREAKFAST 90 capsule 3  . omeprazole (PRILOSEC) 40 MG capsule TAKE ONE CAPSULE BY MOUTH DAILY 30 capsule 11  . Ophthalmic Irrigation Solution (EYE WASH) 99.05 % SOLN Place 1 drop into both eyes 2 (two) times daily.    [name]  oxyCODONE (ROXICODONE) 15 MG immediate release tablet     . rosuvastatin (CRESTOR) 20 MG tablet Take 1 tablet (20 mg total) by mouth daily. 90 tablet 3  . sitaGLIPtin (JANUVIA) 50 MG tablet Take 1 tablet (50 mg total) by mouth daily.  90 tablet 3   No current facility-administered medications for this visit.    Review of Systems : See HPI for pertinent positives and negatives.  Physical Examination  Filed Vitals:   10/31/15 0928 10/31/15 0929  BP: 140/69 137/63  Pulse: 66   Height: 5\' 10"  (1.778 m)   Weight: 183 lb 3.2 oz (83.099 kg)   SpO2: 94%    Body mass index is 26.29 kg/(m^2).  General: WDWN obese male in NAD GAIT: normal Eyes: PERRLA Pulmonary: Respirations are non-labored, CTAB Cardiac: Regular rhythm, no detected murmur. Subcutaneous pacemaker left upper chest.  VASCULAR EXAM Carotid Bruits Right Left   Positive Negative   Aorta is not palpable. Radial pulses are 2+ palpable and equal.      LE Pulses Right Left   FEMORAL 1+ palpable 2+ palpable    POPLITEAL not palpable  not palpable   POSTERIOR TIBIAL faintly palpable  1+ palpable    DORSALIS PEDIS  ANTERIOR TIBIAL faintly palpable  1+ palpable     Gastrointestinal: soft, nontender, BS WNL, no r/g, no palpated masses.  Musculoskeletal: No muscle atrophy/wasting. M/S 5/5 throughout, Extremities without ischemic changes.  Neurologic: A&O X 3; Appropriate Affect;  Speech is normal CN 2-12 intact, Pain and light touch intact in extremities, Motor exam as listed above.         Non-Invasive Vascular Imaging CAROTID DUPLEX 10/31/2015   CEREBROVASCULAR DUPLEX EVALUATION    INDICATION: Carotid artery disease    PREVIOUS INTERVENTION(S): Right carotid endarterectomy 12/31/2010, left carotid endarterectomy 02/09/2011    DUPLEX  EXAM: Carotid duplex    RIGHT  LEFT  Peak Systolic Velocities (cm/s) End Diastolic Velocities (cm/s) Plaque LOCATION Peak Systolic Velocities (cm/s) End Diastolic Velocities (cm/s) Plaque  97 12 HT CCA PROXIMAL 132 13 HT  137 23 HT CCA MID 134 15   132 16 HT CCA DISTAL 118 13 HT  170 3  ECA 194 5 HT  171 29 HM ICA PROXIMAL 137 27   155 34 HM ICA MID 145 29 HT  132 27  ICA DISTAL 110 17     NA ICA / CCA Ratio (PSV) NA  Antegrade Vertebral Flow Antegrade  - Brachial Systolic Pressure (mmHg) -  Triphasic Brachial Artery Waveforms Triphasic    Plaque Morphology:  HM = Homogeneous, HT = Heterogeneous, CP = Calcific Plaque, SP = Smooth Plaque, IP = Irregular Plaque     ADDITIONAL FINDINGS: Multiphasic subclavian arteries    IMPRESSION: 1. Patent bilateral carotid endarterectomy sites with no evidence for restenosis, mild hyperplasia noted in the bilateral surgery sites.    Compared to the previous exam:  No change since exam of 10/25/2014      Assessment: Dean Murphy is a 82 y.o. male who presents s/p staged bilateral carotid endarterectomies in 2012. He had a left amaurosis fugax TIA in 2012 before his fIrst CEA; he has had no subsequent TIA's or strokes. Today's carotid Duplex suggests patent bilateral carotid endarterectomy sites with no evidence for restenosis, mild hyperplasia noted in the bilateral surgery sites. No significant change since exam of 10/25/2014.   Plan: Follow-up in 1 year with Carotid Duplex scan and ABI's.   I discussed in depth with the patient the nature of atherosclerosis, and emphasized the importance of maximal medical management including strict control of blood pressure, blood glucose, and lipid levels, obtaining regular exercise, and continued cessation of smoking.  The patient is aware that without maximal medical management the underlying atherosclerotic disease process will  progress, limiting the benefit of any interventions. The patient was given  information about stroke prevention and what symptoms should prompt the patient to seek immediate medical care. Thank you for allowing [place] to participate in this patient's care.   Clemon Chambers, RN, MSN, FNP-C [facility] Office: [phone]  Clinic Physician: Oneida Alar  10/31/2015 9:55 AM

## 2015-10-31 NOTE — Patient Instructions (Addendum)
Stroke Prevention Some medical conditions and behaviors are associated with an increased chance of having a stroke. You may prevent a stroke by making healthy choices and managing medical conditions. HOW CAN I REDUCE MY RISK OF HAVING A STROKE?   Stay physically active. Get at least 30 minutes of activity on most or all days.  Do not smoke. It may also be helpful to avoid exposure to secondhand smoke.  Limit alcohol use. Moderate alcohol use is considered to be:  No more than 2 drinks per day for men.  No more than 1 drink per day for nonpregnant women.  Eat healthy foods. This involves:  Eating 5 or more servings of fruits and vegetables a day.  Making dietary changes that address high blood pressure (hypertension), high cholesterol, diabetes, or obesity.  Manage your cholesterol levels.  Making food choices that are high in fiber and low in saturated fat, trans fat, and cholesterol may control cholesterol levels.  Take any prescribed medicines to control cholesterol as directed by your health care provider.  Manage your diabetes.  Controlling your carbohydrate and sugar intake is recommended to manage diabetes.  Take any prescribed medicines to control diabetes as directed by your health care provider.  Control your hypertension.  Making food choices that are low in salt (sodium), saturated fat, trans fat, and cholesterol is recommended to manage hypertension.  Ask your health care provider if you need treatment to lower your blood pressure. Take any prescribed medicines to control hypertension as directed by your health care provider.  If you are 18-39 years of age, have your blood pressure checked every 3-5 years. If you are 40 years of age or older, have your blood pressure checked every year.  Maintain a healthy weight.  Reducing calorie intake and making food choices that are low in sodium, saturated fat, trans fat, and cholesterol are recommended to manage  weight.  Stop drug abuse.  Avoid taking birth control pills.  Talk to your health care provider about the risks of taking birth control pills if you are over 35 years old, smoke, get migraines, or have ever had a blood clot.  Get evaluated for sleep disorders (sleep apnea).  Talk to your health care provider about getting a sleep evaluation if you snore a lot or have excessive sleepiness.  Take medicines only as directed by your health care provider.  For some people, aspirin or blood thinners (anticoagulants) are helpful in reducing the risk of forming abnormal blood clots that can lead to stroke. If you have the irregular heart rhythm of atrial fibrillation, you should be on a blood thinner unless there is a good reason you cannot take them.  Understand all your medicine instructions.  Make sure that other conditions (such as anemia or atherosclerosis) are addressed. SEEK IMMEDIATE MEDICAL CARE IF:   You have sudden weakness or numbness of the face, arm, or leg, especially on one side of the body.  Your face or eyelid droops to one side.  You have sudden confusion.  You have trouble speaking (aphasia) or understanding.  You have sudden trouble seeing in one or both eyes.  You have sudden trouble walking.  You have dizziness.  You have a loss of balance or coordination.  You have a sudden, severe headache with no known cause.  You have new chest pain or an irregular heartbeat. Any of these symptoms may represent a serious problem that is an emergency. Do not wait to see if the symptoms will   go away. Get medical help at once. Call your local emergency services (911 in [place] Do not drive yourself to the hospital.   This information is not intended to replace advice given to you by your health care provider. Make sure you discuss any questions you have with your health care provider.   Document Released: 06/18/2004 Document Revised: 06/01/2014 Document Reviewed:  11/11/2012 Elsevier Interactive Patient Education 2016 Elsevier Inc.    Peripheral Vascular Disease Peripheral vascular disease (PVD) is a disease of the blood vessels that are not part of your heart and brain. A simple term for PVD is poor circulation. In most cases, PVD narrows the blood vessels that carry blood from your heart to the rest of your body. This can result in a decreased supply of blood to your arms, legs, and internal organs, like your stomach or kidneys. However, it most often affects a person's lower legs and feet. There are two types of PVD.  Organic PVD. This is the more common type. It is caused by damage to the structure of blood vessels.  Functional PVD. This is caused by conditions that make blood vessels contract and tighten (spasm). Without treatment, PVD tends to get worse over time. PVD can also lead to acute ischemic limb. This is when an arm or limb suddenly has trouble getting enough blood. This is a medical emergency. CAUSES Each type of PVD has many different causes. The most common cause of PVD is buildup of a fatty material (plaque) inside of your arteries (atherosclerosis). Small amounts of plaque can break off from the walls of the blood vessels and become lodged in a smaller artery. This blocks blood flow and can cause acute ischemic limb. Other common causes of PVD include:  Blood clots that form inside of blood vessels.  Injuries to blood vessels.  Diseases that cause inflammation of blood vessels or cause blood vessel spasms.  Health behaviors and health history that increase your risk of developing PVD. RISK FACTORS  You may have a greater risk of PVD if you:  Have a family history of PVD.  Have certain medical conditions, including:  High cholesterol.  Diabetes.  High blood pressure (hypertension).  Coronary heart disease.  Past problems with blood clots.  Past injury, such as burns or a broken bone. These may have damaged blood  vessels in your limbs.  Buerger disease. This is caused by inflamed blood vessels in your hands and feet.  Some forms of arthritis.  Rare birth defects that affect the arteries in your legs.  Use tobacco.  Do not get enough exercise.  Are obese.  Are age 50 or older. SIGNS AND SYMPTOMS  PVD may cause many different symptoms. Your symptoms depend on what part of your body is not getting enough blood. Some common signs and symptoms include:  Cramps in your lower legs. This may be a symptom of poor leg circulation (claudication).  Pain and weakness in your legs while you are physically active that goes away when you rest (intermittent claudication).  Leg pain when at rest.  Leg numbness, tingling, or weakness.  Coldness in a leg or foot, especially when compared with the other leg.  Skin or hair changes. These can include:  Hair loss.  Shiny skin.  Pale or bluish skin.  Thick toenails.  Inability to get or maintain an erection (erectile dysfunction). People with PVD are more prone to developing ulcers and sores on their toes, feet, or legs. These may take longer than   normal to heal. DIAGNOSIS Your health care provider may diagnose PVD from your signs and symptoms. The health care provider will also do a physical exam. You may have tests to find out what is causing your PVD and determine its severity. Tests may include:  Blood pressure recordings from your arms and legs and measurements of the strength of your pulses (pulse volume recordings).  Imaging studies using sound waves to take pictures of the blood flow through your blood vessels (Doppler ultrasound).  Injecting a dye into your blood vessels before having imaging studies using:  X-rays (angiogram or arteriogram).  Computer-generated X-rays (CT angiogram).  A powerful electromagnetic field and a computer (magnetic resonance angiogram or MRA). TREATMENT Treatment for PVD depends on the cause of your condition  and the severity of your symptoms. It also depends on your age. Underlying causes need to be treated and controlled. These include long-lasting (chronic) conditions, such as diabetes, high cholesterol, and high blood pressure. You may need to first try making lifestyle changes and taking medicines. Surgery may be needed if these do not work. Lifestyle changes may include:  Quitting smoking.  Exercising regularly.  Following a low-fat, low-cholesterol diet. Medicines may include:  Blood thinners to prevent blood clots.  Medicines to improve blood flow.  Medicines to improve your blood cholesterol levels. Surgical procedures may include:  A procedure that uses an inflated balloon to open a blocked artery and improve blood flow (angioplasty).  A procedure to put in a tube (stent) to keep a blocked artery open (stent implant).  Surgery to reroute blood flow around a blocked artery (peripheral bypass surgery).  Surgery to remove dead tissue from an infected wound on the affected limb.  Amputation. This is surgical removal of the affected limb. This may be necessary in cases of acute ischemic limb that are not improved through medical or surgical treatments. HOME CARE INSTRUCTIONS  Take medicines only as directed by your health care provider.  Do not use any tobacco products, including cigarettes, chewing tobacco, or electronic cigarettes. If you need help quitting, ask your health care provider.  Lose weight if you are overweight, and maintain a healthy weight as directed by your health care provider.  Eat a diet that is low in fat and cholesterol. If you need help, ask your health care provider.  Exercise regularly. Ask your health care provider to suggest some good activities for you.  Use compression stockings or other mechanical devices as directed by your health care provider.  Take good care of your feet.  Wear comfortable shoes that fit well.  Check your feet often for  any cuts or sores. SEEK MEDICAL CARE IF:  You have cramps in your legs while walking.  You have leg pain when you are at rest.  You have coldness in a leg or foot.  Your skin changes.  You have erectile dysfunction.  You have cuts or sores on your feet that are not healing. SEEK IMMEDIATE MEDICAL CARE IF:  Your arm or leg turns cold and blue.  Your arms or legs become red, warm, swollen, painful, or numb.  You have chest pain or trouble breathing.  You suddenly have weakness in your face, arm, or leg.  You become very confused or lose the ability to speak.  You suddenly have a very bad headache or lose your vision.   This information is not intended to replace advice given to you by your health care provider. Make sure you discuss any questions   you have with your health care provider.   Document Released: 06/18/2004 Document Revised: 06/01/2014 Document Reviewed: 10/19/2013 Elsevier Interactive Patient Education 2016 Elsevier Inc.  

## 2015-11-13 ENCOUNTER — Other Ambulatory Visit: Payer: Self-pay | Admitting: *Deleted

## 2015-11-13 DIAGNOSIS — I480 Paroxysmal atrial fibrillation: Secondary | ICD-10-CM

## 2015-11-13 MED ORDER — APIXABAN 2.5 MG PO TABS: 2.5000 mg | ORAL_TABLET | Freq: Two times a day (BID) | ORAL | Status: DC

## 2015-12-10 ENCOUNTER — Other Ambulatory Visit
Admission: RE | Admit: 2015-12-10 | Discharge: 2015-12-10 | Disposition: A | Discharge status: Home / Self Care | Payer: Medicare Other | Source: Ambulatory Visit | Attending: Nephrology | Admitting: Nephrology

## 2015-12-10 DIAGNOSIS — N183 Chronic kidney disease, stage 3 (moderate): Secondary | ICD-10-CM | POA: Diagnosis present

## 2015-12-10 DIAGNOSIS — I10 Essential (primary) hypertension: Secondary | ICD-10-CM | POA: Insufficient documentation

## 2015-12-10 LAB — BASIC METABOLIC PANEL
ANION GAP: 5 (ref 5–15)
BUN: 33 mg/dL — ABNORMAL HIGH (ref 6–20)
CALCIUM: 9.4 mg/dL (ref 8.9–10.3)
CHLORIDE: 113 mmol/L — AB (ref 101–111)
CO2: 21 mmol/L — AB (ref 22–32)
CREATININE: 1.91 mg/dL — AB (ref 0.61–1.24)
GFR calc non Af Amer: 30 mL/min — ABNORMAL LOW (ref >60)
GFR, EST AFRICAN AMERICAN: 35 mL/min — AB (ref >60)
Glucose, Bld: 199 mg/dL — ABNORMAL HIGH (ref 65–99)
Potassium: 5.7 mmol/L — ABNORMAL HIGH (ref 3.5–5.1)
SODIUM: 139 mmol/L (ref 135–145)

## 2015-12-24 ENCOUNTER — Other Ambulatory Visit (INDEPENDENT_AMBULATORY_CARE_PROVIDER_SITE_OTHER): Payer: Medicare Other

## 2015-12-24 DIAGNOSIS — D509 Iron deficiency anemia, unspecified: Secondary | ICD-10-CM | POA: Diagnosis not present

## 2015-12-24 DIAGNOSIS — E119 Type 2 diabetes mellitus without complications: Secondary | ICD-10-CM

## 2015-12-24 LAB — CBC WITH DIFFERENTIAL/PLATELET
BASOS PCT: 0.4 % (ref 0.0–3.0)
Basophils Absolute: 0 10*3/uL (ref 0.0–0.1)
EOS PCT: 2.3 % (ref 0.0–5.0)
Eosinophils Absolute: 0.2 10*3/uL (ref 0.0–0.7)
HCT: 39.4 % (ref 39.0–52.0)
HEMOGLOBIN: 13.4 g/dL (ref 13.0–17.0)
LYMPHS ABS: 2 10*3/uL (ref 0.7–4.0)
Lymphocytes Relative: 28.1 % (ref 12.0–46.0)
MCHC: 34 g/dL (ref 30.0–36.0)
MCV: 87.1 fl (ref 78.0–100.0)
MONO ABS: 0.5 10*3/uL (ref 0.1–1.0)
MONOS PCT: 6.7 % (ref 3.0–12.0)
Neutro Abs: 4.5 10*3/uL (ref 1.4–7.7)
Neutrophils Relative %: 62.5 % (ref 43.0–77.0)
Platelets: 132 10*3/uL — ABNORMAL LOW (ref 150.0–400.0)
RBC: 4.52 Mil/uL (ref 4.22–5.81)
RDW: 16.2 % — AB (ref 11.5–15.5)
WBC: 7.2 10*3/uL (ref 4.0–10.5)

## 2015-12-24 LAB — COMPREHENSIVE METABOLIC PANEL
ALT: 13 U/L (ref 0–53)
AST: 17 U/L (ref 0–37)
Albumin: 4.1 g/dL (ref 3.5–5.2)
Alkaline Phosphatase: 64 U/L (ref 39–117)
BUN: 27 mg/dL — AB (ref 6–23)
CHLORIDE: 107 meq/L (ref 96–112)
CO2: 28 mEq/L (ref 19–32)
Calcium: 9.6 mg/dL (ref 8.4–10.5)
Creatinine, Ser: 1.83 mg/dL — ABNORMAL HIGH (ref 0.40–1.50)
GFR: 37.43 mL/min — ABNORMAL LOW (ref >60.00)
GLUCOSE: 170 mg/dL — AB (ref 70–99)
POTASSIUM: 5.3 meq/L — AB (ref 3.5–5.1)
SODIUM: 140 meq/L (ref 135–145)
Total Bilirubin: 0.5 mg/dL (ref 0.2–1.2)
Total Protein: 6.8 g/dL (ref 6.0–8.3)

## 2015-12-24 LAB — LIPID PANEL
CHOL/HDL RATIO: 2
Cholesterol: 125 mg/dL (ref 0–200)
HDL: 52.3 mg/dL (ref >39.00)
LDL Cholesterol: 53 mg/dL (ref 0–99)
NONHDL: 72.51
TRIGLYCERIDES: 98 mg/dL (ref 0.0–149.0)
VLDL: 19.6 mg/dL (ref 0.0–40.0)

## 2015-12-24 LAB — IBC PANEL
Iron: 52 ug/dL (ref 42–165)
SATURATION RATIOS: 14.2 % — AB (ref 20.0–50.0)
TRANSFERRIN: 262 mg/dL (ref 212.0–360.0)

## 2015-12-24 LAB — HEMOGLOBIN A1C: HEMOGLOBIN A1C: 6.9 % — AB (ref 4.6–6.5)

## 2015-12-24 LAB — TSH: TSH: 1.61 u[IU]/mL (ref 0.35–4.50)

## 2015-12-27 ENCOUNTER — Ambulatory Visit (INDEPENDENT_AMBULATORY_CARE_PROVIDER_SITE_OTHER): Payer: Medicare Other | Admitting: Family Medicine

## 2015-12-27 ENCOUNTER — Encounter: Payer: Self-pay | Admitting: Family Medicine

## 2015-12-27 DIAGNOSIS — E1122 Type 2 diabetes mellitus with diabetic chronic kidney disease: Secondary | ICD-10-CM | POA: Diagnosis not present

## 2015-12-27 DIAGNOSIS — D649 Anemia, unspecified: Secondary | ICD-10-CM

## 2015-12-27 DIAGNOSIS — N183 Chronic kidney disease, stage 3 unspecified: Secondary | ICD-10-CM

## 2015-12-27 MED ORDER — CLOTRIMAZOLE-BETAMETHASONE 1-0.05 % EX CREA: 1.0000 "application " | TOPICAL_CREAM | Freq: Two times a day (BID) | CUTANEOUS | 0 refills | Status: DC | PRN

## 2015-12-27 NOTE — Patient Instructions (Signed)
Recheck labs in about 3 months.  No changes in meds for now.  Take care.  Glad to see you.  Update me as needed.

## 2015-12-27 NOTE — Progress Notes (Signed)
Pre visit review using our clinic review tool, if applicable. No additional management support is needed unless otherwise documented below in the visit note. 

## 2015-12-27 NOTE — Progress Notes (Signed)
He was asking about coming off amitiza and iron.   D/w pt.   CBC improved, but iron level still isn't high.  No ADE on meds.  D/w pt about rechecking CBC in about 3 months, along with iron studies.  If doing well, then consider stopping iron and possibly stopping amitiza thereafter, assuming he is still doing well.    He has f/u with Dr. Candiss Norse pending with renal.    His knee is clearly better from prev.  On amoxil currently.  Has f/u with Dr. Megan Salon later this month.  No FCNAV.   He needed refill on lotrisone for prn use for a rash, should it recur.    Dm2.  No lows.  No ADE on meds.  Sugars usually 125-150 at home recently.  dw pt about labs.  Lipids at goal.   PMH and SH reviewed  ROS: Per HPI unless specifically indicated in ROS section   Meds, vitals, and allergies reviewed.   GEN: nad, alert and oriented HEENT: mucous membranes moist NECK: supple w/o LA CV: rrr.  no murmur PULM: ctab, no inc wob ABD: soft, +bs EXT: no edema SKIN: no acute rash

## 2015-12-29 ENCOUNTER — Encounter: Payer: Self-pay | Admitting: Family Medicine

## 2015-12-29 NOTE — Assessment & Plan Note (Signed)
No lows.  No ADE on meds.  Sugars usually 125-150 at home recently.  dw pt about labs.  Lipids at goal.  A1c likely higher from inc in hgb, not from worse control.  Overall at goal, continue as is.  D/w pt.

## 2015-12-29 NOTE — Assessment & Plan Note (Signed)
I'll defer to renal.  I didn't intervene on his mildly elevated K with a med change, esp since his Cr was better.  D/w pt about lower K diet.   BP controlled.  D/w pt.  He agrees.

## 2015-12-29 NOTE — Assessment & Plan Note (Signed)
CBC improved, but iron level still isn't high.  No ADE on meds.  D/w pt about rechecking CBC in about 3 months, along with iron studies.  If doing well, then consider stopping iron and possibly stopping amitiza thereafter.

## 2015-12-31 ENCOUNTER — Ambulatory Visit (INDEPENDENT_AMBULATORY_CARE_PROVIDER_SITE_OTHER): Payer: Medicare Other | Admitting: *Deleted

## 2015-12-31 DIAGNOSIS — I442 Atrioventricular block, complete: Secondary | ICD-10-CM

## 2015-12-31 NOTE — Progress Notes (Signed)
Remote pacemaker transmission.   

## 2016-01-01 ENCOUNTER — Encounter: Payer: Self-pay | Admitting: Cardiology

## 2016-01-04 ENCOUNTER — Other Ambulatory Visit: Payer: Self-pay | Admitting: Cardiology

## 2016-01-06 ENCOUNTER — Ambulatory Visit (INDEPENDENT_AMBULATORY_CARE_PROVIDER_SITE_OTHER): Payer: Medicare Other | Admitting: Cardiology

## 2016-01-06 ENCOUNTER — Encounter: Payer: Self-pay | Admitting: Cardiology

## 2016-01-06 VITALS — BP 168/88 | HR 69 | Ht 70.0 in | Wt 188.6 lb

## 2016-01-06 DIAGNOSIS — I442 Atrioventricular block, complete: Secondary | ICD-10-CM

## 2016-01-06 DIAGNOSIS — I10 Essential (primary) hypertension: Secondary | ICD-10-CM

## 2016-01-06 DIAGNOSIS — E785 Hyperlipidemia, unspecified: Secondary | ICD-10-CM

## 2016-01-06 DIAGNOSIS — I2510 Atherosclerotic heart disease of native coronary artery without angina pectoris: Secondary | ICD-10-CM | POA: Diagnosis not present

## 2016-01-06 DIAGNOSIS — Z9889 Other specified postprocedural states: Secondary | ICD-10-CM

## 2016-01-06 DIAGNOSIS — I480 Paroxysmal atrial fibrillation: Secondary | ICD-10-CM

## 2016-01-06 DIAGNOSIS — I5033 Acute on chronic diastolic (congestive) heart failure: Secondary | ICD-10-CM | POA: Diagnosis not present

## 2016-01-06 MED ORDER — ROSUVASTATIN CALCIUM 20 MG PO TABS: 20.0000 mg | ORAL_TABLET | Freq: Every day | ORAL | 3 refills | Status: DC

## 2016-01-06 NOTE — Progress Notes (Signed)
Patient ID: BENTO GANTERT, male   DOB: [birth year], 82 y.o.   MRN: [id number]     Cardiology Office Note Date:  01/06/2016  Patient ID:  Dean Murphy, Dean Murphy 1927-01-07, MRN [id number] PCP:  Elsie Stain, MD  Cardiologist: Dr. Meda Coffee  Chief Complaint: f/u chest pain, low BP  History of Present Illness: Dean Murphy is a 82 y.o. male with history of CAD s/p CABG 1989, CKD stage IV, HTN with history of orthostasis, DM, anemia, CHB [id number] (complicated by acute diastolic CHF) s/p PPM, CVA, carotid disease s/p CEAs (followed by [facility]), hyperlipidemia, PAF who presents for f/u exertional fatigue and low BP. He was seen by Melina Copa in 02/2015 for decreased stamina and tendency toward lower blood pressures. Dr. Damita Dunnings cut his amlodipine in half to 2.5mg  daily. Since that time the patient says he feels "100% better." Even his sons have noticed that he has had increased stamina. Dyspnea has improved tremendously and he no longer feels lethargic. He has not had any chest pain or pressure. He follows his BP at home and this AM got a reading of 130/75.  12/30/2015  - patient is coming after 6 months, he states that he feels great he is planning a trip to the beach with his wife and friends. He has been compliant to his medicines denies any chest pain shortness of breath. 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 Otherwise he has no orthopnea proximal nocturnal dyspnea no palpitations or syncope. He has been seen by Dr. Lovena Le for pacemaker checkup and his pacemaker is working properly. Since we discontinued his amlodipine at the last visit his lower extremity edema has resolved. He also feels better from dizziness standpoint.  Last echo 07/2014 showed mild LVH, EF [id number], normal diastolic function, mild MR.  Past Medical History:  Diagnosis Date  . Anemia   . Arthritis   . CAD (coronary artery disease)    a. CABG 1989, b. Myoview  low risk 2012.  . Carotid artery occlusion    a. Duplex 10/2014: patent R/L CEA with mild hyperplasia in right surgical bulb - followed by vascular.  . CHB (complete heart block) [facility]) March 2016   a. s/p STJ dual chamber pacemaker 07/2014.  [name] Chronic diastolic CHF (congestive heart failure) ([place])    a. Dx [id number] - acute diastolic CHF in the setting of CHB.  [name] Chronic kidney disease, stage IV (severe) (HCC)    stage III/IV as of 2015, [name]  . CKD (chronic kidney disease), stage IV ([place])   . Colon polyps   . Complication of anesthesia   . Constipation   . CVA (cerebral infarction)   . Diabetes mellitus    type II  . Diverticulosis   . Dupuytren's disease    finger right hand contracted  . Essential hypertension   . GERD (gastroesophageal reflux disease)   . Hemorrhoids   . Hyperlipidemia   . Myocardial infarction [facility]) 1976  and Mar. 19, 2016  . Orthostasis   . Osteoporosis   . PAF (paroxysmal atrial fibrillation) [facility]) March 2016  . PONV (postoperative nausea and vomiting)   . Presence of permanent cardiac pacemaker   . Shortness of breath dyspnea    due to pain    Past Surgical History:  Procedure Laterality Date  . CARDIAC CATHETERIZATION  08/11/2014   Procedure: TEMPORARY PACEMAKER;  Surgeon: [name], MD;  Location: [facility];  Service: Cardiovascular;;  . CAROTID ENDARTERECTOMY  12/31/10   Right  . CAROTID ENDARTERECTOMY  02/09/11   left  . CHOLECYSTECTOMY    . CORONARY ARTERY BYPASS GRAFT  1989  . EYE SURGERY Bilateral    Cataract with implants  . I&D KNEE WITH POLY EXCHANGE Left 07/29/2015   Procedure: IRRIGATION AND DEBRIDEMENT LEFT KNEE WITH POLY EXCHANGE;  Surgeon: Rod Can, MD;  Location: [place];  Service: Orthopedics;  Laterality: Left;  . JOINT REPLACEMENT     bilat. knees  . PERMANENT PACEMAKER INSERTION N/A 08/13/2014   STJ dual chamber pacemaker implanted by [name] for CHB  . TONSILLECTOMY    . TOTAL KNEE ARTHROPLASTY      bilateral    Current Outpatient Prescriptions  Medication Sig Dispense Refill  . amoxicillin (AMOXIL) 500 MG capsule Take 1 capsule (500 mg total) by mouth 2 (two) times daily. 60 capsule 4  . apixaban (ELIQUIS) 2.5 MG TABS tablet Take 1 tablet (2.5 mg total) by mouth 2 (two) times daily. 180 tablet 2  . clotrimazole-betamethasone (LOTRISONE) cream Apply 1 application topically 2 (two) times daily as needed. 30 g 0  . diltiazem (TIAZAC) 360 MG 24 hr capsule Take 1 capsule (360 mg total) by mouth daily. 90 capsule 3  . ferrous sulfate ([facility] IRON) 325 (65 FE) MG tablet Twice a day on Mon-Fri, skip weekends    . glipiZIDE (GLUCOTROL) 5 MG tablet Take 1 tablet (5 mg total) by mouth daily before breakfast.    . lubiprostone (AMITIZA) 8 MCG capsule TAKE 1 CAPSULE BY MOUTH  DAILY WITH  BREAKFAST 90 capsule 3  . omeprazole (PRILOSEC) 40 MG capsule TAKE ONE CAPSULE BY MOUTH DAILY 30 capsule 11  . Ophthalmic Irrigation Solution (EYE WASH) 99.05 % SOLN Place 1 drop into both eyes 2 (two) times daily.    . rosuvastatin (CRESTOR) 20 MG tablet Take 1 tablet (20 mg total) by mouth daily. 90 tablet 3  . sitaGLIPtin (JANUVIA) 50 MG tablet Take 1 tablet (50 mg total) by mouth daily. 90 tablet 3  . rosuvastatin (CRESTOR) 20 MG tablet TAKE 1 TABLET BY MOUTH DAILY 90 tablet 3   No current facility-administered medications for this visit.     Allergies:   Reclast [zoledronic acid]   Social History:  The patient  reports that he quit smoking about 42 years ago. His smoking use included Cigarettes. He has a 25.00 pack-year smoking history. He has never used smokeless tobacco. He reports that he drinks alcohol. He reports that he does not use drugs.   Family History:  The patient's family history includes Diabetes in his mother, son, and son; Heart attack in his brother, father, and mother; Heart disease in his brother, father, and mother; Hypertension in his brother, father, son, and son; Varicose Veins in his  mother.  ROS:  Please see the history of present illness.   All other systems are reviewed and otherwise negative.   PHYSICAL EXAM:  VS:  BP (!) 168/88   Pulse 69   Ht 5\' 10"  (1.778 m)   Wt 188 lb 9.6 oz (85.5 kg)   BMI 27.06 kg/m  BMI: Body mass index is 27.06 kg/m. Well nourished, well developed WM, in no acute distress - lively, acts younger than stated age 64: normocephalic, atraumatic Neck: no JVD, +right carotid bruit, no masses Cardiac:  normal S1, S2; RRR; no murmurs, rubs, or gallops Lungs:  clear to auscultation bilaterally, no wheezing,  rhonchi or rales Abd: soft, nontender, no hepatomegaly, + BS MS: no deformity or atrophy Ext: no edema Skin: warm and dry, no rash Neuro:  moves all extremities spontaneously, no focal abnormalities noted, follows commands Psych: euthymic mood, full affect   EKG:  Done today shows NSR 65bpm, LBBB, left axis deviation, 1st degree AVB - unchanged from earlier this year.  Recent Labs: 12/24/2015: ALT 13; BUN 27; Creatinine, Ser 1.83; Hemoglobin 13.4; Platelets 132.0; Potassium 5.3; Sodium 140; TSH 1.61  12/24/2015: Cholesterol 125; HDL 52.30; LDL Cholesterol 53; Total CHOL/HDL Ratio 2; Triglycerides 98.0; VLDL 19.6   Estimated Creatinine Clearance (by C-G formula based on SCr of 1.83 mg/dL) Male: 26.2 mL/min Male: 29.9 mL/min   Wt Readings from Last 3 Encounters:  01/06/16 188 lb 9.6 oz (85.5 kg)  12/27/15 188 lb 8 oz (85.5 kg)  10/31/15 183 lb 3.2 oz (83.1 kg)    EKG performed today 01/06/2016 showed normal sinus rhythm with ventricular pacing. This is unchanged from prior EKG.  Other studies reviewed: Additional studies/records reviewed today include: summarized above  ASSESSMENT AND PLAN:  1. Lower extremity edema and dizziness - resolved after discontinuation of amlodipine, his blood pressure remains minimally elevated at home 140-150 that is appropriate for his age especially when lower blood pressure, most symptoms of  dizziness. 2. CAD as above - no recent angina. Continue to follow.  3. Hypertension - as above. 4. Anemia - followed by PCP. 5. Paroxysmal atrial fibrillation - maintaining NSR. Continue lower dose Eliquis. No bleeding, stable Hb in 05/2015 at 9.9 g/dL. 6. Chronic diastolic CHF - appears euvolemic. Lower extremity edema resolved after discontinuation of amlodipine. 7. Carotid disease - s/p B/L endarterectomy in 2012, stable carotid US in 10/2014, followed by Dr Oneida Alar.  Follow up in 6 months.  Signed, Ena Dawley, MD  01/06/2016 2:50 PM     [address] [place] [address] [phone] (office)  [phone] (fax)

## 2016-01-06 NOTE — Patient Instructions (Signed)

## 2016-01-07 LAB — CUP PACEART REMOTE DEVICE CHECK
Brady Statistic AP VP Percent: 24 %
Brady Statistic AP VS Percent: 1 %
Brady Statistic RV Percent Paced: 99 %
Implantable Lead Implant Date: 20160321
Implantable Lead Location: 753859
Lead Channel Impedance Value: 430 Ohm
Lead Channel Impedance Value: 450 Ohm
Lead Channel Pacing Threshold Amplitude: 0.625 V
Lead Channel Pacing Threshold Pulse Width: 0.5 ms
Lead Channel Setting Pacing Amplitude: 0.875
Lead Channel Setting Pacing Pulse Width: 0.5 ms
Lead Channel Setting Sensing Sensitivity: 4 mV
MDC IDC LEAD IMPLANT DT: 20160321
MDC IDC LEAD LOCATION: 753860
MDC IDC MSMT BATTERY REMAINING LONGEVITY: 122 mo
MDC IDC MSMT BATTERY REMAINING PERCENTAGE: 95.5 %
MDC IDC MSMT BATTERY VOLTAGE: 3.01 V
MDC IDC MSMT LEADCHNL RA PACING THRESHOLD AMPLITUDE: 0.5 V
MDC IDC MSMT LEADCHNL RA PACING THRESHOLD PULSEWIDTH: 0.5 ms
MDC IDC MSMT LEADCHNL RA SENSING INTR AMPL: 1.6 mV
MDC IDC MSMT LEADCHNL RV SENSING INTR AMPL: 12 mV
MDC IDC SESS DTM: 20170808060015
MDC IDC SET LEADCHNL RA PACING AMPLITUDE: 2 V
MDC IDC STAT BRADY AS VP PERCENT: 76 %
MDC IDC STAT BRADY AS VS PERCENT: 1 %
MDC IDC STAT BRADY RA PERCENT PACED: 23 %
Pulse Gen Model: 2240
Pulse Gen Serial Number: 7734710

## 2016-01-20 ENCOUNTER — Other Ambulatory Visit: Payer: Medicare Other

## 2016-01-21 ENCOUNTER — Ambulatory Visit (INDEPENDENT_AMBULATORY_CARE_PROVIDER_SITE_OTHER): Payer: Medicare Other | Admitting: Internal Medicine

## 2016-01-21 ENCOUNTER — Encounter: Payer: Self-pay | Admitting: Internal Medicine

## 2016-01-21 DIAGNOSIS — T8454XA Infection and inflammatory reaction due to internal left knee prosthesis, initial encounter: Secondary | ICD-10-CM

## 2016-01-21 MED ORDER — AMOXICILLIN 500 MG PO CAPS: 500.0000 mg | ORAL_CAPSULE | Freq: Two times a day (BID) | ORAL | 4 refills | Status: AC

## 2016-01-21 NOTE — Progress Notes (Signed)
[facility]ious [facility]  Patient Active Problem List   Diagnosis Date Noted  . Infection of prosthetic left knee joint ([place]) 07/29/2015    Priority: High  . Coronary artery disease 07/29/2015  . Stroke ([place]) 12/24/2014  . Double vision 12/23/2014  . Pacemaker 11/16/2014  . Anemia 09/27/2014  . Gastrointestinal hemorrhage associated with peptic ulcer 09/07/2014  . Atrial fibrillation-CHADS VASC2 = 5 (age, HTN, DM, Vasc) 08/13/2014  . Chest pain 08/11/2014  . Complete heart block ([place]) 08/11/2014  . S/P CABG x 5 1989. Low risk Myoview 2012 08/11/2014  . Syncope 08/11/2014  . Cough 06/14/2014  . Left foot pain 02/23/2014  . Gouty arthropathy 08/09/2013  . Chronic radicular lumbar pain 12/15/2012  . PVD- s/p bilat CEA- CA dopplers OK Jan 2015 10/06/2012  . Insomnia 09/30/2011  . OTHER SPECIFIED SITES OF SPRAINS AND STRAINS 04/25/2010  . DIVERTICULITIS OF COLON 02/21/2010  . TEMPOROMANDIBULAR JOINT DISORDER 11/22/2009  . DM (diabetes mellitus), type 2 with renal complications ([place]) [id number]  . Chronic kidney disease, stage III (moderate) 07/26/2008  . CHRONIC PROSTATITIS 07/26/2008  . ADVEF, DRUG/MEDICINAL/BIOLOGICAL SUBST NOS 03/16/2007  . Dyslipidemia 11/15/2006  . Essential hypertension 11/15/2006  . GERD 11/15/2006  . Osteoarthritis 11/15/2006    Patient's Medications  New Prescriptions   No medications on file  Previous Medications   APIXABAN (ELIQUIS) 2.5 MG TABS TABLET    Take 1 tablet (2.5 mg total) by mouth 2 (two) times daily.   CLOTRIMAZOLE-BETAMETHASONE (LOTRISONE) CREAM    Apply 1 application topically 2 (two) times daily as needed.   DILTIAZEM (TIAZAC) 360 MG 24 HR CAPSULE    Take 1 capsule (360 mg total) by mouth daily.   FERROUS SULFATE ([facility] IRON) 325 (65 FE) MG TABLET    Twice a day on Mon-Fri, skip weekends   GLIPIZIDE (GLUCOTROL) 5 MG TABLET    Take 1 tablet (5 mg total) by mouth daily before breakfast.   LUBIPROSTONE (AMITIZA)  8 MCG CAPSULE    TAKE 1 CAPSULE BY MOUTH  DAILY WITH  BREAKFAST   OMEPRAZOLE (PRILOSEC) 40 MG CAPSULE    TAKE ONE CAPSULE BY MOUTH DAILY   OPHTHALMIC IRRIGATION SOLUTION (EYE WASH) 99.05 % SOLN    Place 1 drop into both eyes 2 (two) times daily.   ROSUVASTATIN (CRESTOR) 20 MG TABLET    TAKE 1 TABLET BY MOUTH DAILY   ROSUVASTATIN (CRESTOR) 20 MG TABLET    Take 1 tablet (20 mg total) by mouth daily.   SITAGLIPTIN (JANUVIA) 50 MG TABLET    Take 1 tablet (50 mg total) by mouth daily.  Modified Medications   Modified Medication Previous Medication   AMOXICILLIN (AMOXIL) 500 MG CAPSULE amoxicillin (AMOXIL) 500 MG capsule      Take 1 capsule (500 mg total) by mouth 2 (two) times daily.    Take 1 capsule (500 mg total) by mouth 2 (two) times daily.  Discontinued Medications   No medications on file    Subjective: [name] is in with his wife for his routine follow-up visit. He is now completed 5 and half months of antibiotic therapy for his enterococcal left prosthetic knee infection. He is taking his amoxicillin and has had no problems tolerating it. He is feeling much better. He is having very little discomfort in his left knee and does not need to take anything for pain. He has resumed all of his usual activities.  Review of Systems: Review  of Systems  Constitutional: Negative for chills, diaphoresis, fever and malaise/fatigue.  Gastrointestinal: Negative for abdominal pain, diarrhea, nausea and vomiting.  Musculoskeletal: Negative for joint pain.    Past Medical History:  Diagnosis Date  . Anemia   . Arthritis   . CAD (coronary artery disease)    a. CABG 1989, b. Myoview low risk 2012.  . Carotid artery occlusion    a. Duplex 10/2014: patent R/L CEA with mild hyperplasia in right surgical bulb - followed by vascular.  . CHB (complete heart block) [facility]) March 2016   a. s/p STJ dual chamber pacemaker 07/2014.  [name] Chronic diastolic CHF (congestive heart failure) ([place])    a. Dx [id number] - acute  diastolic CHF in the setting of CHB.  [name] Chronic kidney disease, stage IV (severe) (HCC)    stage III/IV as of 2015, [name]  . CKD (chronic kidney disease), stage IV ([place])   . Colon polyps   . Complication of anesthesia   . Constipation   . CVA (cerebral infarction)   . Diabetes mellitus    type II  . Diverticulosis   . Dupuytren's disease    finger right hand contracted  . Essential hypertension   . GERD (gastroesophageal reflux disease)   . Hemorrhoids   . Hyperlipidemia   . Myocardial infarction [facility]) 1976  and Mar. 19, 2016  . Orthostasis   . Osteoporosis   . PAF (paroxysmal atrial fibrillation) [facility]) March 2016  . PONV (postoperative nausea and vomiting)   . Presence of permanent cardiac pacemaker   . Shortness of breath dyspnea    due to pain    Social History  Substance Use Topics  . Smoking status: Former Smoker    Packs/day: 1.00    Years: 25.00    Types: Cigarettes    Quit date: 01/21/1973  . Smokeless tobacco: Never Used     Comment: began smoking in high school, quit age 30  . Alcohol use 0.0 oz/week     Comment: 1-2 per week    Family History  Problem Relation Age of Onset  . Heart disease Mother     Before age 30  . Diabetes Mother   . Varicose Veins Mother   . Heart attack Mother   . Heart attack Father   . Heart disease Father     After age 58  . Hypertension Father   . Heart disease Brother     Before age 33  . Hypertension Brother   . Heart attack Brother   . Diabetes Son   . Hypertension Son   . Hypertension Son   . Diabetes Son   . Diabetes    . Cancer    . Colon cancer Neg Hx     Allergies  Allergen Reactions  . Reclast [Zoledronic Acid] Other (See Comments)    Stopped 2015 due to Creatine     Objective: Vitals:   01/21/16 1049  BP: (!) 162/71  Pulse: 73  Temp: 97.6 F (36.4 C)  TempSrc: Oral  SpO2: 97%  Weight: 195 lb (88.5 kg)   Body mass index is 27.98 kg/m.  Physical Exam  Constitutional: He is  oriented to person, place, and time.  He is in good spirits.  Musculoskeletal: Normal range of motion. He exhibits no edema or tenderness.  Neurological: He is alert and oriented to person, place, and time.  Skin: No rash noted.  Psychiatric: Mood and affect normal.    Lab Results Sed Rate (  mm/hr)  Date Value  10/17/2015 6  07/29/2015 85 (H)  06/02/2012 9   CRP (mg/dL)  Date Value  10/17/2015 <0.5  07/29/2015 20.4 (H)     Problem List Items Addressed This Visit      High   Infection of prosthetic left knee joint ([place])    He is doing very well with no signs of active infection in his left prosthetic knee. His inflammatory markers have normalized as of his last visit. He will complete 2 more weeks of amoxicillin and then stop. He will follow-up here in 3 months. He knows to call if he has any signs of early relapse.      Relevant Medications   amoxicillin (AMOXIL) 500 MG capsule    Other Visit Diagnoses   None.      Michel Bickers, MD [facility] [phone] pager   [phone] cell 01/21/2016, 11:12 AM

## 2016-01-21 NOTE — Assessment & Plan Note (Signed)
He is doing very well with no signs of active infection in his left prosthetic knee. His inflammatory markers have normalized as of his last visit. He will complete 2 more weeks of amoxicillin and then stop. He will follow-up here in 3 months. He knows to call if he has any signs of early relapse.

## 2016-02-25 ENCOUNTER — Telehealth: Payer: Self-pay | Admitting: *Deleted

## 2016-02-25 NOTE — Telephone Encounter (Signed)
Please add him onto my clinic schedule on 02/27/2016. Also ask him what he is taking for pain. I am willing to give him a refill of his hydrocodone-acetaminophen if he needs it.

## 2016-02-25 NOTE — Telephone Encounter (Signed)
Patient completed antibiotics 9/25, states that his knee flared again 9/29. He saw Dr. Lyla Glassing 10/2 who advised him to come back on 10/10 to have fluid pulled off.   Patient wants to know why he needs to wait and suffer in the mean time. He would like to know what to do with regard to pain, swelling, and his inability to move on it.  Please advise.  Landis Gandy, RN

## 2016-02-25 NOTE — Telephone Encounter (Signed)
Spoke with patient, he will come 10/5 at 3:30.  He states he has a few pain pills left, as he doesn't like them and takes them only sparingly.

## 2016-02-27 ENCOUNTER — Encounter: Payer: Self-pay | Admitting: Internal Medicine

## 2016-02-27 ENCOUNTER — Ambulatory Visit (INDEPENDENT_AMBULATORY_CARE_PROVIDER_SITE_OTHER): Payer: Medicare Other | Admitting: Internal Medicine

## 2016-02-27 DIAGNOSIS — Z23 Encounter for immunization: Secondary | ICD-10-CM | POA: Diagnosis not present

## 2016-02-27 DIAGNOSIS — T8454XD Infection and inflammatory reaction due to internal left knee prosthesis, subsequent encounter: Secondary | ICD-10-CM

## 2016-02-27 LAB — CBC
HCT: 41.2 % (ref 38.5–50.0)
HEMOGLOBIN: 13.7 g/dL (ref 13.2–17.1)
MCH: 30.6 pg (ref 27.0–33.0)
MCHC: 33.3 g/dL (ref 32.0–36.0)
MCV: 92 fL (ref 80.0–100.0)
MPV: 10.3 fL (ref 7.5–12.5)
Platelets: 169 10*3/uL (ref 140–400)
RBC: 4.48 MIL/uL (ref 4.20–5.80)
RDW: 13.3 % (ref 11.0–15.0)
WBC: 10.3 10*3/uL (ref 3.8–10.8)

## 2016-02-27 MED ORDER — OXYCODONE-ACETAMINOPHEN 5-325 MG PO TABS: 1.0000 | ORAL_TABLET | ORAL | 0 refills | Status: DC | PRN

## 2016-02-27 NOTE — Progress Notes (Signed)
[facility] for [facility]ious Disease  Patient Active Problem List   Diagnosis Date Noted  . Infection of prosthetic left knee joint ([place]) 07/29/2015    Priority: High  . Coronary artery disease 07/29/2015  . Stroke ([place]) 12/24/2014  . Double vision 12/23/2014  . Pacemaker 11/16/2014  . Anemia 09/27/2014  . Gastrointestinal hemorrhage associated with peptic ulcer 09/07/2014  . Atrial fibrillation-CHADS VASC2 = 5 (age, HTN, DM, Vasc) 08/13/2014  . Chest pain 08/11/2014  . Complete heart block ([place]) 08/11/2014  . S/P CABG x 5 1989. Low risk Myoview 2012 08/11/2014  . Syncope 08/11/2014  . Cough 06/14/2014  . Left foot pain 02/23/2014  . Gouty arthropathy 08/09/2013  . Chronic radicular lumbar pain 12/15/2012  . PVD- s/p bilat CEA- CA dopplers OK Jan 2015 10/06/2012  . Insomnia 09/30/2011  . OTHER SPECIFIED SITES OF SPRAINS AND STRAINS 04/25/2010  . DIVERTICULITIS OF COLON 02/21/2010  . TEMPOROMANDIBULAR JOINT DISORDER 11/22/2009  . DM (diabetes mellitus), type 2 with renal complications ([place]) [id number]  . Chronic kidney disease, stage III (moderate) 07/26/2008  . CHRONIC PROSTATITIS 07/26/2008  . ADVEF, DRUG/MEDICINAL/BIOLOGICAL SUBST NOS 03/16/2007  . Dyslipidemia 11/15/2006  . Essential hypertension 11/15/2006  . GERD 11/15/2006  . Osteoarthritis 11/15/2006    Patient's Medications  New Prescriptions   OXYCODONE-ACETAMINOPHEN (PERCOCET/ROXICET) 5-325 MG TABLET    Take 1 tablet by mouth every 4 (four) hours as needed for severe pain.  Previous Medications   APIXABAN (ELIQUIS) 2.5 MG TABS TABLET    Take 1 tablet (2.5 mg total) by mouth 2 (two) times daily.   CLOTRIMAZOLE-BETAMETHASONE (LOTRISONE) CREAM    Apply 1 application topically 2 (two) times daily as needed.   DILTIAZEM (TIAZAC) 360 MG 24 HR CAPSULE    Take 1 capsule (360 mg total) by mouth daily.   FERROUS SULFATE ([employer] IRON) 325 (65 FE) MG TABLET    Twice a day on Mon-Fri, skip weekends   GLIPIZIDE  (GLUCOTROL) 5 MG TABLET    Take 1 tablet (5 mg total) by mouth daily before breakfast.   LUBIPROSTONE (AMITIZA) 8 MCG CAPSULE    TAKE 1 CAPSULE BY MOUTH  DAILY WITH  BREAKFAST   OMEPRAZOLE (PRILOSEC) 40 MG CAPSULE    TAKE ONE CAPSULE BY MOUTH DAILY   OPHTHALMIC IRRIGATION SOLUTION (EYE WASH) 99.05 % SOLN    Place 1 drop into both eyes 2 (two) times daily.   ROSUVASTATIN (CRESTOR) 20 MG TABLET    TAKE 1 TABLET BY MOUTH DAILY   ROSUVASTATIN (CRESTOR) 20 MG TABLET    Take 1 tablet (20 mg total) by mouth daily.   SITAGLIPTIN (JANUVIA) 50 MG TABLET    Take 1 tablet (50 mg total) by mouth daily.  Modified Medications   No medications on file  Discontinued Medications   No medications on file    Subjective: Dean Murphy is seen on a work in basis. He is accompanied by his wife and son. 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 By 02/21/2016 he was starting to have sharp pain and swelling in that knee. He did not have any injury. He saw his orthopedic surgeon, Dr. Lyla Murphy who instructed him to come back on 03/03/2016 for an attempt to aspirate fluid after he had been off of antibiotics for 2 weeks. He has not had any fever, chills or sweats. He rates his current pain at 8 out of 10. He  took one Percocet last night but has been trying to get by without any pain medication.  Review of Systems: Review of Systems  Constitutional: Positive for malaise/fatigue. Negative for chills, diaphoresis, fever and weight loss.  Respiratory: Negative for cough, sputum production and shortness of breath.   Cardiovascular: Negative for chest pain.  Gastrointestinal: Negative for abdominal pain, diarrhea, nausea and vomiting.  Musculoskeletal: Positive for joint pain. Negative for myalgias.  Skin: Negative for rash.  Neurological: Negative for headaches.    Past Medical History:  Diagnosis Date  . Anemia   . Arthritis   . CAD (coronary artery  disease)    a. CABG 1989, b. Myoview low risk 2012.  . Carotid artery occlusion    a. Duplex 10/2014: patent R/L CEA with mild hyperplasia in right surgical bulb - followed by vascular.  . CHB (complete heart block) [facility]) March 2016   a. s/p STJ dual chamber pacemaker 07/2014.  [name] Chronic diastolic CHF (congestive heart failure) ([place])    a. Dx [id number] - acute diastolic CHF in the setting of CHB.  [name] Chronic kidney disease, stage IV (severe) (HCC)    stage III/IV as of 2015, [name]  . CKD (chronic kidney disease), stage IV ([place])   . Colon polyps   . Complication of anesthesia   . Constipation   . CVA (cerebral infarction)   . Diabetes mellitus    type II  . Diverticulosis   . Dupuytren's disease    finger right hand contracted  . Essential hypertension   . GERD (gastroesophageal reflux disease)   . Hemorrhoids   . Hyperlipidemia   . Myocardial infarction 1976  and Mar. 19, 2016  . Orthostasis   . Osteoporosis   . PAF (paroxysmal atrial fibrillation) [facility]) March 2016  . PONV (postoperative nausea and vomiting)   . Presence of permanent cardiac pacemaker   . Shortness of breath dyspnea    due to pain    Social History  Substance Use Topics  . Smoking status: Former Smoker    Packs/day: 1.00    Years: 25.00    Types: Cigarettes    Quit date: 01/21/1973  . Smokeless tobacco: Never Used     Comment: began smoking in high school, quit age 49  . Alcohol use 0.0 oz/week     Comment: 1-2 per week    Family History  Problem Relation Age of Onset  . Heart disease Mother     Before age 23  . Diabetes Mother   . Varicose Veins Mother   . Heart attack Mother   . Heart attack Father   . Heart disease Father     After age 55  . Hypertension Father   . Heart disease Brother     Before age 74  . Hypertension Brother   . Heart attack Brother   . Diabetes Son   . Hypertension Son   . Hypertension Son   . Diabetes Son   . Diabetes    . Cancer    . Colon cancer Neg  Hx     Allergies  Allergen Reactions  . Reclast [Zoledronic Acid] Other (See Comments)    Stopped 2015 due to Creatine     Objective: Vitals:   02/27/16 1527  BP: 130/67  Pulse: 71  Temp: 98.2 F (36.8 C)  TempSrc: Oral  Weight: 192 lb 12 oz (87.4 kg)  Height: 5\' 11"  (1.803 m)   Body mass index is 26.88 kg/m.  Physical  Exam  Constitutional: He is oriented to person, place, and time.  He is very uncomfortable due to pain. He is holding back tears.  Cardiovascular: Normal rate and regular rhythm.   No murmur heard. Pulmonary/Chest: Effort normal and breath sounds normal. He has no wheezes. He has no rales.  Abdominal: Soft. There is no tenderness.  Musculoskeletal:  He has some diffuse swelling of his left knee and lower leg. He has discomfort in the popliteal fossa and along his left knee medially. I am not sure that there is an effusion.  Neurological: He is alert and oriented to person, place, and time.  Skin: No rash noted.  Psychiatric: Mood and affect normal.    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 I agree with trying to get a repeat culture after being off of antibiotics for 2 weeks. I encouraged him to take Tylenol as instructed on the bottle and told him that it is okay to use his Percocet for severe pain as needed. He is very wary of having a two-stage revision arthroplasty. I told him that another option to consider would be to try to control and then suppress his infection long-term a with antibiotics. He states that he would strongly consider this. I told him I will call him next week after he has seen Dr. Lyla Murphy again. Depending on the results of  that exam and how he is feeling I would consider replacing a PICC and starting vancomycin again. If his symptoms improve we could then transition him back to oral amoxicillin and have him take it as long as he gets benefit and tolerates it. If this fails to work then he could consider surgery at that time.      Relevant Medications   oxyCODONE-acetaminophen (PERCOCET/ROXICET) 5-325 MG tablet   Other Relevant Orders   CBC   Basic metabolic panel   C-reactive protein   Sedimentation rate    Other Visit Diagnoses    Encounter for immunization       Relevant Orders   Flu Vaccine QUAD 36+ mos IM (Completed)       Michel Bickers, MD [facility] for [place] [phone] pager   [phone] cell 02/27/2016, 5:11 PM

## 2016-02-27 NOTE — Assessment & Plan Note (Signed)
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 I agree with trying to get a repeat culture after being off of antibiotics for 2 weeks. I encouraged him to take Tylenol as instructed on the bottle and told him that it is okay to use his Percocet for severe pain as needed. He is very wary of having a two-stage revision arthroplasty. I told him that another option to consider would be to try to control and then suppress his infection long-term a with antibiotics. He states that he would strongly consider this. I told him I will call him next week after he has seen Dr. Lyla Glassing again. Depending on the results of that exam and how he is feeling I would consider replacing a PICC and starting vancomycin again. If his symptoms improve we could then transition him back to oral amoxicillin and have him take it as long as he gets benefit and tolerates it. If this fails to work then he could consider surgery at that time.

## 2016-02-28 LAB — BASIC METABOLIC PANEL
BUN: 34 mg/dL — AB (ref 7–25)
CALCIUM: 9.4 mg/dL (ref 8.6–10.3)
CO2: 21 mmol/L (ref 20–31)
Chloride: 102 mmol/L (ref 98–110)
Creat: 2.49 mg/dL — ABNORMAL HIGH (ref 0.70–1.11)
GLUCOSE: 210 mg/dL — AB (ref 65–99)
Potassium: 5.4 mmol/L — ABNORMAL HIGH (ref 3.5–5.3)
SODIUM: 139 mmol/L (ref 135–146)

## 2016-02-28 LAB — C-REACTIVE PROTEIN: CRP: 294.1 mg/L — AB (ref <8.0)

## 2016-02-28 LAB — SEDIMENTATION RATE: Sed Rate: 45 mm/hr — ABNORMAL HIGH (ref 0–20)

## 2016-03-03 ENCOUNTER — Ambulatory Visit (HOSPITAL_COMMUNITY)
Admission: RE | Admit: 2016-03-03 | Discharge: 2016-03-03 | Disposition: A | Discharge status: Home / Self Care | Payer: Medicare Other | Source: Ambulatory Visit | Attending: Vascular Surgery | Admitting: Vascular Surgery

## 2016-03-03 ENCOUNTER — Other Ambulatory Visit (HOSPITAL_COMMUNITY): Payer: Self-pay | Admitting: Orthopedic Surgery

## 2016-03-03 ENCOUNTER — Telehealth: Payer: Self-pay | Admitting: *Deleted

## 2016-03-03 ENCOUNTER — Other Ambulatory Visit: Payer: Self-pay | Admitting: Internal Medicine

## 2016-03-03 DIAGNOSIS — T8454XA Infection and inflammatory reaction due to internal left knee prosthesis, initial encounter: Secondary | ICD-10-CM

## 2016-03-03 DIAGNOSIS — M7989 Other specified soft tissue disorders: Secondary | ICD-10-CM

## 2016-03-03 DIAGNOSIS — T8454XD Infection and inflammatory reaction due to internal left knee prosthesis, subsequent encounter: Secondary | ICD-10-CM | POA: Diagnosis present

## 2016-03-03 DIAGNOSIS — Y838 Other surgical procedures as the cause of abnormal reaction of the patient, or of later complication, without mention of misadventure at the time of the procedure: Secondary | ICD-10-CM | POA: Diagnosis not present

## 2016-03-03 NOTE — Telephone Encounter (Signed)
Dean Murphy came in to ask question about duration now of his abx. He is off of abx now for about 2 wks so abx will be re-started after culture today. She needed duration plan to let [facility] knows. Looks like cont infection of knee with pus at aspiration. Told her to let [facility] to plan for 6 wks for now until seen by Dr. Megan Salon.   Dean Murphy, PharmD Pager: [phone] 03/03/2016 4:38 PM

## 2016-03-03 NOTE — Telephone Encounter (Signed)
Patient's son Dean Murphy called on the advice of Dr. Clemetine Marker, orthopedic stating patient needed to contact Dr. Megan Salon ASAP. He had fluid drawn off his knee and pus is present. Looks like cellulitis again and it is warm to touch and painful. Ortho has ordered a doppler for today and Per Dr. Hale Bogus last office note consideration for picc would be decided after seeing Dr. Clemetine Marker. Spoke to Dr. Megan Salon and he does want a new picc line placed. Appt for picc is tomorrow at 11:30 at [facility]. [place] notified and order, notes, and insurance info faxed. Dr. Linus Salmons signed. Weekly Vanc trough, BUN, CBC with diff, sed rate, and BMP weekly as previously with Dr. Megan Salon to decide if any other labs are needed. Son notified

## 2016-03-04 ENCOUNTER — Telehealth: Payer: Self-pay

## 2016-03-04 ENCOUNTER — Encounter (HOSPITAL_COMMUNITY): Payer: Self-pay | Admitting: Interventional Radiology

## 2016-03-04 ENCOUNTER — Other Ambulatory Visit: Payer: Self-pay | Admitting: Internal Medicine

## 2016-03-04 ENCOUNTER — Ambulatory Visit (HOSPITAL_COMMUNITY)
Admission: RE | Admit: 2016-03-04 | Discharge: 2016-03-04 | Disposition: A | Discharge status: Home / Self Care | Payer: Medicare Other | Source: Ambulatory Visit | Attending: Internal Medicine | Admitting: Internal Medicine

## 2016-03-04 DIAGNOSIS — T8454XA Infection and inflammatory reaction due to internal left knee prosthesis, initial encounter: Secondary | ICD-10-CM

## 2016-03-04 HISTORY — PX: IR GENERIC HISTORICAL: IMG1180011

## 2016-03-04 MED ORDER — HEPARIN SOD (PORK) LOCK FLUSH 100 UNIT/ML IV SOLN
INTRAVENOUS | Status: AC
  Administered 2016-03-04: 0.1 [IU]
  Filled 2016-03-04: qty 5

## 2016-03-04 MED ORDER — LIDOCAINE HCL 1 % IJ SOLN
INTRAMUSCULAR | Status: DC | PRN
  Administered 2016-03-04: 3 mL

## 2016-03-04 MED ORDER — LIDOCAINE HCL 1 % IJ SOLN
INTRAMUSCULAR | Status: AC
  Filled 2016-03-04: qty 20

## 2016-03-04 NOTE — Telephone Encounter (Signed)
Pt was notified of instructions, pt verbalized understanding.   

## 2016-03-04 NOTE — Telephone Encounter (Signed)
V/M left that pt is presently taking vancomycin thru a port for bacterial infection in lt artificial knee and wants to know if a fluid pill can be given to relieve fluid collected in lower leg. Pt last seen 12/27/15. Request cb.[place].

## 2016-03-04 NOTE — Telephone Encounter (Signed)
I would try to elevate the leg as much as possible and if still ongoing I would like to see him in clinic before considering a fluid pill, as that could possible cause other issues.  Thanks.

## 2016-03-04 NOTE — Telephone Encounter (Signed)
Phone cal to [place] 03/03/16.  Order for IV antibiotics duration 6 weeks per [facility] pharmacist

## 2016-03-04 NOTE — Procedures (Signed)
RUE PICC SVC RA 39 cm No comp/EBL

## 2016-03-06 ENCOUNTER — Telehealth: Payer: Self-pay | Admitting: Internal Medicine

## 2016-03-06 NOTE — Telephone Encounter (Signed)
I called Dean Murphy to see how he is doing. He had purulent fluid aspirated from his left knee several days ago when he saw Dr. Lyla Glassing. I called [facility] lab and they do not have the specimen. I tried calling [facility] but could not get through to them. I suspect that he has an early relapse of his recent enterococcal prosthetic joint infection. He had a PICC placed 2 days ago and started on vancomycin yesterday. He is still having pain and stiffness. He is taking Tylenol during the day and 1 oxycodone at bedtime. I will arrange follow-up with me in several weeks.

## 2016-03-09 NOTE — Telephone Encounter (Signed)
Follow-up appointment after starting IV Antibiotics.  Left message that a follow-up appointment has been made for Monday, Nov. 6 at 4 PM with Dr. Megan Salon.

## 2016-03-10 NOTE — Telephone Encounter (Signed)
Per Dr. Hale Bogus request, moved appt to 03/24/16 @ 1015.  Left the patient a telephone message with this information.

## 2016-03-12 ENCOUNTER — Telehealth: Payer: Self-pay | Admitting: *Deleted

## 2016-03-12 NOTE — Telephone Encounter (Signed)
Received call from Gregary Signs at Dr Noralyn Pick office. [place] lab, but the "results show a recurrence of the same bacteria." If you need to speak with Dr Delfino Lovett, please call [phone] and ask for his clinic. Landis Gandy, RN

## 2016-03-18 ENCOUNTER — Encounter: Payer: Self-pay | Admitting: Internal Medicine

## 2016-03-23 NOTE — Telephone Encounter (Signed)
Called Dr. Delfino Lovett office to fax over the culture results. They said that they'll do it today.

## 2016-03-24 ENCOUNTER — Ambulatory Visit (INDEPENDENT_AMBULATORY_CARE_PROVIDER_SITE_OTHER): Payer: Medicare Other | Admitting: Internal Medicine

## 2016-03-24 ENCOUNTER — Encounter: Payer: Self-pay | Admitting: Internal Medicine

## 2016-03-24 DIAGNOSIS — T8454XD Infection and inflammatory reaction due to internal left knee prosthesis, subsequent encounter: Secondary | ICD-10-CM

## 2016-03-24 MED ORDER — AMPICILLIN IV (FOR PTA / DISCHARGE USE ONLY): 8.0000 g | INTRAVENOUS | 0 refills | Status: DC

## 2016-03-24 NOTE — Progress Notes (Signed)
HPI: Dean Murphy is a 82 y.o. male who presents for follow up of his prosthetic knee infection.  Allergies: Allergies  Allergen Reactions  . Reclast [Zoledronic Acid] Other (See Comments)    Stopped 2015 due to Creatine     Vitals: Temp: 97.7 F (36.5 C) (10/31 1013) Temp Source: Oral (10/31 1013) BP: 168/68 (10/31 1013) Pulse Rate: 81 (10/31 1013)  Past Medical History: Past Medical History:  Diagnosis Date  . Anemia   . Arthritis   . CAD (coronary artery disease)    a. CABG 1989, b. Myoview low risk 2012.  . Carotid artery occlusion    a. Duplex 10/2014: patent R/L CEA with mild hyperplasia in right surgical bulb - followed by vascular.  . CHB (complete heart block) [facility]) March 2016   a. s/p STJ dual chamber pacemaker 07/2014.  [name] Chronic diastolic CHF (congestive heart failure) ([place])    a. Dx [id number] - acute diastolic CHF in the setting of CHB.  [name] Chronic kidney disease, stage IV (severe) (HCC)    stage III/IV as of 2015, [name]  . CKD (chronic kidney disease), stage IV ([place])   . Colon polyps   . Complication of anesthesia   . Constipation   . CVA (cerebral infarction)   . Diabetes mellitus    type II  . Diverticulosis   . Dupuytren's disease    finger right hand contracted  . Essential hypertension   . GERD (gastroesophageal reflux disease)   . Hemorrhoids   . Hyperlipidemia   . Myocardial infarction 1976  and Mar. 19, 2016  . Orthostasis   . Osteoporosis   . PAF (paroxysmal atrial fibrillation) [facility]) March 2016  . PONV (postoperative nausea and vomiting)   . Presence of permanent cardiac pacemaker   . Shortness of breath dyspnea    due to pain    Social History: Social History   Social History  . Marital status: Married    Spouse name: N/A  . Number of children: N/A  . Years of education: N/A   Occupational History  . retired Retired   Social History Main Topics  . Smoking status: Former Smoker    Packs/day: 1.00    Years: 25.00   Types: Cigarettes    Quit date: 01/21/1973  . Smokeless tobacco: Never Used     Comment: began smoking in high school, quit age 12  . Alcohol use 0.0 oz/week     Comment: 1-2 per week  . Drug use: No  . Sexual activity: Not Asked   Other Topics Concern  . 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  Remarried 1998.     3 sons    Current Regimen: IV vancomycin 03/03/16 - current  Labs: 10/30 SCr from [place]: 1.71, estimated CrCl ~ 35 ml/min 10/30 Vancomycin trough: 23.5   Assessment: Dean Murphy is an 82 yo M with recurrent L prosthetic knee enterococcal infection. His knee was aspirated by Dr. Clemetine Marker and sent for culture, it grew the same organism as his prior infection (Enterococcus, pan-sensitive). He started vancomycin on 10/10. Dean Murphy reports feeling much better and has had no trouble with his home IV antibiotic therapy.   His labs from [place] have shown a steady increase in his creatinine over the last 2 weeks, with his most recent creatinine being 1.71 and a vancomycin trough of 23.5.  Although this is still better than his baseline we will discontinue the vancomycin and switch to the less nephrotoxic agent ampicillin.  The patient was educated on his new antibiotic regimen and a prescription was called into [facility]. We also discussed the need for suppressive antibiotic therapy following the completion of 6 weeks of IV therapy. We will revisit this at his follow up appointment.  Recommendations: Discontinue vancomycin Start ampicillin 8g IV continuous daily infusion x 3 more weeks Monitor weekly CBC, CMP and sed rate Follow up in three weeks, patient will likely transition to oral amoxicillin at this visit   Dimitri Ped, PharmD. PGY-2 Infectious Diseases Pharmacy Resident Pager: [phone] 03/24/2016, 10:48 AM

## 2016-03-24 NOTE — Progress Notes (Signed)
Phone call to [place], Stanton Kidney, [place].  Verbal order from Dr. Michel Bickers for continuous IV Ampicillin, 8 grams daily for 3 weeks.  Labs = weekly cbc/diff, BMP, Sed Rate, and CRP.  Order verbalized back by Stanton Kidney, RN.

## 2016-03-25 ENCOUNTER — Other Ambulatory Visit: Payer: Self-pay | Admitting: Family Medicine

## 2016-03-30 ENCOUNTER — Ambulatory Visit: Payer: Medicare Other | Admitting: Internal Medicine

## 2016-03-31 ENCOUNTER — Telehealth: Payer: Self-pay | Admitting: Cardiology

## 2016-03-31 ENCOUNTER — Ambulatory Visit (INDEPENDENT_AMBULATORY_CARE_PROVIDER_SITE_OTHER): Payer: Medicare Other | Admitting: *Deleted

## 2016-03-31 DIAGNOSIS — I442 Atrioventricular block, complete: Secondary | ICD-10-CM

## 2016-03-31 NOTE — Telephone Encounter (Signed)
Spoke with pt and reminded pt of remote transmission that is due today. Pt verbalized understanding.   

## 2016-03-31 NOTE — Progress Notes (Signed)
Remote pacemaker transmission.   

## 2016-04-01 ENCOUNTER — Encounter: Payer: Self-pay | Admitting: Cardiology

## 2016-04-03 LAB — HM DIABETES EYE EXAM

## 2016-04-07 ENCOUNTER — Encounter: Payer: Self-pay | Admitting: Family Medicine

## 2016-04-14 ENCOUNTER — Ambulatory Visit (INDEPENDENT_AMBULATORY_CARE_PROVIDER_SITE_OTHER): Payer: Medicare Other | Admitting: Internal Medicine

## 2016-04-14 ENCOUNTER — Encounter: Payer: Self-pay | Admitting: Internal Medicine

## 2016-04-14 DIAGNOSIS — T8454XD Infection and inflammatory reaction due to internal left knee prosthesis, subsequent encounter: Secondary | ICD-10-CM

## 2016-04-14 MED ORDER — AMOXICILLIN 500 MG PO CAPS: 500.0000 mg | ORAL_CAPSULE | Freq: Two times a day (BID) | ORAL | 11 refills | Status: DC

## 2016-04-14 NOTE — Assessment & Plan Note (Signed)
He has had some improvement with 6 weeks of IV antibiotics. I will change him to renally dosed oral amoxicillin and have his PICC removed. He may benefit from a compressive stocking and compressive sleeve over his knee. I suggested he come to [facility] supply to review his options. I also suggested that he use Tylenol on a more regular basis. He will follow-up here in 6 weeks.

## 2016-04-14 NOTE — Progress Notes (Signed)
Verbal order from Dr. Megan Salon to d/c IV Ampicillin and remove PICC.  Phone call to [place], Amy Pharmacist.  Verbal order given and repeated back.

## 2016-04-14 NOTE — Progress Notes (Signed)
[place] for Infectious Disease  Patient Active Problem List   Diagnosis Date Noted  . Infection of prosthetic left knee joint ([place]) 07/29/2015    Priority: High  . Coronary artery disease 07/29/2015  . Stroke ([place]) 12/24/2014  . Double vision 12/23/2014  . Pacemaker 11/16/2014  . Anemia 09/27/2014  . Gastrointestinal hemorrhage associated with peptic ulcer 09/07/2014  . Atrial fibrillation-CHADS VASC2 = 5 (age, HTN, DM, Vasc) 08/13/2014  . Chest pain 08/11/2014  . Complete heart block ([place]) 08/11/2014  . S/P CABG x 5 1989. Low risk Myoview 2012 08/11/2014  . Syncope 08/11/2014  . Cough 06/14/2014  . Left foot pain 02/23/2014  . Gouty arthropathy 08/09/2013  . Chronic radicular lumbar pain 12/15/2012  . PVD- s/p bilat CEA- CA dopplers OK Jan 2015 10/06/2012  . Insomnia 09/30/2011  . OTHER SPECIFIED SITES OF SPRAINS AND STRAINS 04/25/2010  . DIVERTICULITIS OF COLON 02/21/2010  . TEMPOROMANDIBULAR JOINT DISORDER 11/22/2009  . DM (diabetes mellitus), type 2 with renal complications ([place]) [id number]  . Chronic kidney disease, stage III (moderate) 07/26/2008  . CHRONIC PROSTATITIS 07/26/2008  . ADVEF, DRUG/MEDICINAL/BIOLOGICAL SUBST NOS 03/16/2007  . Dyslipidemia 11/15/2006  . Essential hypertension 11/15/2006  . GERD 11/15/2006  . Osteoarthritis 11/15/2006    Patient's Medications  New Prescriptions   AMOXICILLIN (AMOXIL) 500 MG CAPSULE    Take 1 capsule (500 mg total) by mouth 2 (two) times daily.  Previous Medications   AMITIZA 8 MCG CAPSULE    TAKE ONE CAPSULE BY MOUTH EVERY MORNING WITH BREAKFAST   APIXABAN (ELIQUIS) 2.5 MG TABS TABLET    Take 1 tablet (2.5 mg total) by mouth 2 (two) times daily.   CLOTRIMAZOLE-BETAMETHASONE (LOTRISONE) CREAM    Apply 1 application topically 2 (two) times daily as needed.   DILTIAZEM (TIAZAC) 360 MG 24 HR CAPSULE    Take 1 capsule (360 mg total) by mouth daily.   FERROUS SULFATE ([employer] IRON) 325 (65 FE) MG TABLET     Twice a day on Mon-Fri, skip weekends   GLIPIZIDE (GLUCOTROL) 5 MG TABLET    Take 1 tablet (5 mg total) by mouth daily before breakfast.   JANUVIA 50 MG TABLET    TAKE 1 TABLET BY MOUTH DAILY   OMEPRAZOLE (PRILOSEC) 40 MG CAPSULE    TAKE ONE CAPSULE BY MOUTH DAILY   OPHTHALMIC IRRIGATION SOLUTION (EYE WASH) 99.05 % SOLN    Place 1 drop into both eyes 2 (two) times daily.   OXYCODONE-ACETAMINOPHEN (PERCOCET/ROXICET) 5-325 MG TABLET    Take 1 tablet by mouth every 4 (four) hours as needed for severe pain.   ROSUVASTATIN (CRESTOR) 20 MG TABLET    TAKE 1 TABLET BY MOUTH DAILY   ROSUVASTATIN (CRESTOR) 20 MG TABLET    Take 1 tablet (20 mg total) by mouth daily.  Modified Medications   No medications on file  Discontinued Medications   AMPICILLIN IVPB    Inject 8 g into the vein continuous.    Subjective: Mr. Idolina Primer is in with his family for his follow-up visit. He is now completed 6 weeks of IV antibiotics for his relapsed enterococcal left prosthetic knee infection. Overall, he is feeling better but he still has occasional periods where his left knee pain is up to 8 out of 10. He rarely takes anything for his pain. The last week he has tried Tylenol which does help but he was not sure if he could take it. He also notes  some swelling in his left lower leg which worsens throughout the day and gets better at night when he is in bed. He has not had any problems tolerating his IV ampicillin or PICC.  Review of Systems: Review of Systems  Constitutional: Negative for chills, diaphoresis, fever, malaise/fatigue and weight loss.  HENT: Negative for sore throat.   Respiratory: Negative for cough, sputum production and shortness of breath.   Cardiovascular: Negative for chest pain.  Gastrointestinal: Negative for abdominal pain, diarrhea, nausea and vomiting.  Musculoskeletal: Positive for joint pain. Negative for myalgias.  Skin: Negative for rash.    Past Medical History:  Diagnosis Date  . Anemia     . Arthritis   . CAD (coronary artery disease)    a. CABG 1989, b. Myoview low risk 2012.  . Carotid artery occlusion    a. Duplex 10/2014: patent R/L CEA with mild hyperplasia in right surgical bulb - followed by vascular.  . CHB (complete heart block) [facility]) March 2016   a. s/p STJ dual chamber pacemaker 07/2014.  [name] Chronic diastolic CHF (congestive heart failure) ([place])    a. Dx [id number] - acute diastolic CHF in the setting of CHB.  [name] Chronic kidney disease, stage IV (severe) (HCC)    stage III/IV as of 2015, [name]  . CKD (chronic kidney disease), stage IV ([place])   . Colon polyps   . Complication of anesthesia   . Constipation   . CVA (cerebral infarction)   . Diabetes mellitus    type II  . Diverticulosis   . Dupuytren's disease    finger right hand contracted  . Essential hypertension   . GERD (gastroesophageal reflux disease)   . Hemorrhoids   . Hyperlipidemia   . Myocardial infarction 1976  and Mar. 19, 2016  . Orthostasis   . Osteoporosis   . PAF (paroxysmal atrial fibrillation) [facility]) March 2016  . PONV (postoperative nausea and vomiting)   . Presence of permanent cardiac pacemaker   . Shortness of breath dyspnea    due to pain    Social History  Substance Use Topics  . Smoking status: Former Smoker    Packs/day: 1.00    Years: 25.00    Types: Cigarettes    Quit date: 01/21/1973  . Smokeless tobacco: Never Used     Comment: began smoking in high school, quit age 23  . Alcohol use 0.0 oz/week     Comment: 1-2 per week    Family History  Problem Relation Age of Onset  . Heart disease Mother     Before age 60  . Diabetes Mother   . Varicose Veins Mother   . Heart attack Mother   . Heart attack Father   . Heart disease Father     After age 22  . Hypertension Father   . Heart disease Brother     Before age 31  . Hypertension Brother   . Heart attack Brother   . Diabetes Son   . Hypertension Son   . Hypertension Son   . Diabetes Son   .  Diabetes    . Cancer    . Colon cancer Neg Hx     Allergies  Allergen Reactions  . Reclast [Zoledronic Acid] Other (See Comments)    Stopped 2015 due to Creatine     Objective: Vitals:   04/14/16 0949  BP: (!) 179/65  Temp: 97.7 F (36.5 C)  TempSrc: Oral  Weight: 189 lb 6.4 oz (85.9  kg)  Height: 5\' 9"  (1.753 m)   Body mass index is 27.97 kg/m.  Physical Exam  Constitutional: He is oriented to person, place, and time.  He is well dressed and in good spirits.  Musculoskeletal: Normal range of motion. He exhibits edema. He exhibits no tenderness.  He has 1+ hitting edema of his left lower leg. His left knee incision is fully healed. He has good range of motion.  Neurological: He is alert and oriented to person, place, and time.  Skin: No rash noted.  PICC site looks good.  Psychiatric: Mood and affect normal.    Lab Results    Problem List Items Addressed This Visit      High   Infection of prosthetic left knee joint ([place])    He has had some improvement with 6 weeks of IV antibiotics. I will change him to renally dosed oral amoxicillin and have his PICC removed. He may benefit from a compressive stocking and compressive sleeve over his knee. I suggested he come to [facility] supply to review his options. I also suggested that he use Tylenol on a more regular basis. He will follow-up here in 6 weeks.      Relevant Medications   amoxicillin (AMOXIL) 500 MG capsule       Michel Bickers, MD [facility] for Infectious [place] [phone] pager   [phone] cell 04/14/2016, 10:26 AM

## 2016-04-20 ENCOUNTER — Other Ambulatory Visit (INDEPENDENT_AMBULATORY_CARE_PROVIDER_SITE_OTHER): Payer: Medicare Other

## 2016-04-20 DIAGNOSIS — D649 Anemia, unspecified: Secondary | ICD-10-CM

## 2016-04-20 DIAGNOSIS — E1122 Type 2 diabetes mellitus with diabetic chronic kidney disease: Secondary | ICD-10-CM

## 2016-04-20 DIAGNOSIS — D508 Other iron deficiency anemias: Secondary | ICD-10-CM | POA: Diagnosis not present

## 2016-04-20 LAB — IBC PANEL
IRON: 55 ug/dL (ref 42–165)
SATURATION RATIOS: 16.7 % — AB (ref 20.0–50.0)
TRANSFERRIN: 235 mg/dL (ref 212.0–360.0)

## 2016-04-20 LAB — HEMOGLOBIN A1C: HEMOGLOBIN A1C: 7 % — AB (ref 4.6–6.5)

## 2016-04-20 LAB — CBC WITH DIFFERENTIAL/PLATELET
BASOS ABS: 0 10*3/uL (ref 0.0–0.1)
Basophils Relative: 0.7 % (ref 0.0–3.0)
EOS ABS: 0.3 10*3/uL (ref 0.0–0.7)
Eosinophils Relative: 3.6 % (ref 0.0–5.0)
HCT: 37.1 % — ABNORMAL LOW (ref 39.0–52.0)
Hemoglobin: 12.4 g/dL — ABNORMAL LOW (ref 13.0–17.0)
LYMPHS ABS: 2.5 10*3/uL (ref 0.7–4.0)
Lymphocytes Relative: 35.9 % (ref 12.0–46.0)
MCHC: 33.4 g/dL (ref 30.0–36.0)
MCV: 88.6 fl (ref 78.0–100.0)
MONO ABS: 0.4 10*3/uL (ref 0.1–1.0)
MONOS PCT: 5.4 % (ref 3.0–12.0)
NEUTROS ABS: 3.8 10*3/uL (ref 1.4–7.7)
NEUTROS PCT: 54.4 % (ref 43.0–77.0)
PLATELETS: 188 10*3/uL (ref 150.0–400.0)
RBC: 4.19 Mil/uL — AB (ref 4.22–5.81)
RDW: 14 % (ref 11.5–15.5)
WBC: 7.1 10*3/uL (ref 4.0–10.5)

## 2016-04-22 ENCOUNTER — Ambulatory Visit: Payer: Medicare Other | Admitting: Internal Medicine

## 2016-04-23 ENCOUNTER — Encounter: Payer: Self-pay | Admitting: Family Medicine

## 2016-04-23 ENCOUNTER — Ambulatory Visit (INDEPENDENT_AMBULATORY_CARE_PROVIDER_SITE_OTHER): Payer: Medicare Other | Admitting: Family Medicine

## 2016-04-23 VITALS — BP 158/60 | HR 75 | Temp 97.5°F | Wt 187.8 lb

## 2016-04-23 DIAGNOSIS — T8454XD Infection and inflammatory reaction due to internal left knee prosthesis, subsequent encounter: Secondary | ICD-10-CM

## 2016-04-23 DIAGNOSIS — D649 Anemia, unspecified: Secondary | ICD-10-CM | POA: Diagnosis not present

## 2016-04-23 DIAGNOSIS — E1122 Type 2 diabetes mellitus with diabetic chronic kidney disease: Secondary | ICD-10-CM

## 2016-04-23 NOTE — Progress Notes (Signed)
History of infected joint hardware. Still with dull ache in the L knee, treated with tylenol.  Still seeing ID and ortho.  Still on abx, long term.  He is tolerating abx w/o ADE.  He describes his situation as "functional" and he didn't want to have surgery at this point.  I told him I didn't have an opinion at this point, I'll defer to ortho and ID and the patient.  He accepted my statement.  PICC is out currently.   Diabetes:  Using medications without difficulties:yes Hypoglycemic episodes:no Hyperglycemic episodes:no Feet problems: some tingling in the feet B.  Stable per patient report.  Blood Sugars averaging: 100-125 eye exam within last year: yes A1c 7.  Reasonable given his other considerations.    Anemia.  Hgb slightly low, iron level not low.  D/w pt about labs.  Colonoscopy 2016 with suspected diverticular bleed.  No gross blood in stool.  Still on iron.  Still on eliquis.    PMH and SH reviewed  Meds, vitals, and allergies reviewed.   ROS: Per HPI unless specifically indicated in ROS section   GEN: nad, alert and oriented HEENT: mucous membranes moist NECK: supple w/o LA CV: rrr. PULM: ctab, no inc wob ABD: soft, +bs EXT: no edema SKIN: no acute rash  Diabetic foot exam: Normal inspection No skin breakdown No calluses  Decreased DP pulses B Normal sensation to light touch but decreased sensation to monofilament on plantar side bilaterally Nails normal

## 2016-04-23 NOTE — Patient Instructions (Signed)
Recheck cbc and iron level in about 6 weeks.  No office visit scheduled with that.  Plan on other labs before office visit in about 3 months.  Update me as needed.  Take care.  Glad to see you.

## 2016-04-23 NOTE — Progress Notes (Signed)
Pre visit review using our clinic review tool, if applicable. No additional management support is needed unless otherwise documented below in the visit note. 

## 2016-04-27 NOTE — Assessment & Plan Note (Signed)
No blood in stool. No GI symptoms. Reasonable to continue iron and recheck episodically. Given his age and other considerations it is likely more hazardous to pursue an aggressive workup with colonoscopy then it is to just continue iron replacement as is. As long as he's not having GI symptoms, no blood in stool, and feeling well otherwise, it may be reasonable to continue as is. Discussed with patient. He agrees. He will update me as needed. >25 minutes spent in face to face time with patient, >50% spent in counselling or coordination of care.

## 2016-04-27 NOTE — Assessment & Plan Note (Signed)
Per infectious disease clinic and orthopedics. Appreciate help of all involved.

## 2016-04-27 NOTE — Assessment & Plan Note (Signed)
Reasonable to consider as is. Discussed with patient. Discussed with him about foot care. Recheck periodically. He agrees.

## 2016-04-28 ENCOUNTER — Encounter: Payer: Self-pay | Admitting: Internal Medicine

## 2016-04-28 LAB — CUP PACEART REMOTE DEVICE CHECK
Battery Remaining Longevity: 121 mo
Brady Statistic AP VP Percent: 28 %
Brady Statistic AP VS Percent: 1 %
Brady Statistic AS VP Percent: 72 %
Brady Statistic AS VS Percent: 1 %
Implantable Lead Implant Date: 20160321
Implantable Lead Location: 753860
Lead Channel Impedance Value: 400 Ohm
Lead Channel Impedance Value: 490 Ohm
Lead Channel Pacing Threshold Pulse Width: 0.5 ms
Lead Channel Pacing Threshold Pulse Width: 0.5 ms
Lead Channel Sensing Intrinsic Amplitude: 12 mV
Lead Channel Setting Pacing Amplitude: 0.875
Lead Channel Setting Pacing Amplitude: 2 V
MDC IDC LEAD IMPLANT DT: 20160321
MDC IDC LEAD LOCATION: 753859
MDC IDC MSMT BATTERY REMAINING PERCENTAGE: 95.5 %
MDC IDC MSMT BATTERY VOLTAGE: 3.01 V
MDC IDC MSMT LEADCHNL RA PACING THRESHOLD AMPLITUDE: 0.5 V
MDC IDC MSMT LEADCHNL RA SENSING INTR AMPL: 2.7 mV
MDC IDC MSMT LEADCHNL RV PACING THRESHOLD AMPLITUDE: 0.625 V
MDC IDC PG IMPLANT DT: 20160321
MDC IDC PG SERIAL: 7734710
MDC IDC SESS DTM: 20171107172509
MDC IDC SET LEADCHNL RV PACING PULSEWIDTH: 0.5 ms
MDC IDC SET LEADCHNL RV SENSING SENSITIVITY: 4 mV
MDC IDC STAT BRADY RA PERCENT PACED: 27 %
MDC IDC STAT BRADY RV PERCENT PACED: 99 %

## 2016-04-29 ENCOUNTER — Encounter: Payer: Self-pay | Admitting: Internal Medicine

## 2016-05-26 ENCOUNTER — Ambulatory Visit: Payer: Medicare Other | Admitting: Internal Medicine

## 2016-06-04 ENCOUNTER — Other Ambulatory Visit: Payer: Medicare Other

## 2016-06-04 ENCOUNTER — Ambulatory Visit (INDEPENDENT_AMBULATORY_CARE_PROVIDER_SITE_OTHER): Payer: Medicare Other

## 2016-06-04 VITALS — BP 140/70 | HR 66 | Temp 98.4°F | Ht 68.0 in | Wt 190.8 lb

## 2016-06-04 DIAGNOSIS — Z0000 Encounter for general adult medical examination without abnormal findings: Secondary | ICD-10-CM | POA: Diagnosis not present

## 2016-06-04 DIAGNOSIS — D649 Anemia, unspecified: Secondary | ICD-10-CM

## 2016-06-04 LAB — CBC WITH DIFFERENTIAL/PLATELET
BASOS PCT: 0.5 % (ref 0.0–3.0)
Basophils Absolute: 0 10*3/uL (ref 0.0–0.1)
EOS ABS: 0.1 10*3/uL (ref 0.0–0.7)
Eosinophils Relative: 1.9 % (ref 0.0–5.0)
HCT: 39.2 % (ref 39.0–52.0)
HEMOGLOBIN: 13.2 g/dL (ref 13.0–17.0)
Lymphocytes Relative: 31.1 % (ref 12.0–46.0)
Lymphs Abs: 2.4 10*3/uL (ref 0.7–4.0)
MCHC: 33.7 g/dL (ref 30.0–36.0)
MCV: 87.6 fl (ref 78.0–100.0)
MONO ABS: 0.6 10*3/uL (ref 0.1–1.0)
Monocytes Relative: 7.6 % (ref 3.0–12.0)
Neutro Abs: 4.5 10*3/uL (ref 1.4–7.7)
Neutrophils Relative %: 58.9 % (ref 43.0–77.0)
Platelets: 160 10*3/uL (ref 150.0–400.0)
RBC: 4.48 Mil/uL (ref 4.22–5.81)
RDW: 14.2 % (ref 11.5–15.5)
WBC: 7.7 10*3/uL (ref 4.0–10.5)

## 2016-06-04 LAB — IBC PANEL
IRON: 115 ug/dL (ref 42–165)
Saturation Ratios: 30 % (ref 20.0–50.0)
TRANSFERRIN: 274 mg/dL (ref 212.0–360.0)

## 2016-06-04 NOTE — Progress Notes (Signed)
Pre visit review using our clinic review tool, if applicable. No additional management support is needed unless otherwise documented below in the visit note. 

## 2016-06-04 NOTE — Patient Instructions (Signed)
[name] , Thank you for taking time to come for your Medicare Wellness Visit. I appreciate your ongoing commitment to your health goals. Please review the following plan we discussed and let me know if I can assist you in the future.   These are the goals we discussed: Goals    . Increase water intake          Starting 06/05/2016, I will attempt to drink at least 8 oz water with each meal daily.        This is a list of the screening recommended for you and due dates:  Health Maintenance  Topic Date Due  . Shingles Vaccine  06/04/2017*  . Hemoglobin A1C  10/18/2016  . Eye exam for diabetics  04/03/2017  . Complete foot exam   04/23/2017  . Tetanus Vaccine  07/27/2018  . Flu Shot  Completed  . Pneumonia vaccines  Completed  *Topic was postponed. The date shown is not the original due date.   Preventive Care for Adults  A healthy lifestyle and preventive care can promote health and wellness. Preventive health guidelines for adults include the following key practices.  . A routine yearly physical is a good way to check with your health care provider about your health and preventive screening. It is a chance to share any concerns and updates on your health and to receive a thorough exam.  . Visit your dentist for a routine exam and preventive care every 6 months. Brush your teeth twice a day and floss once a day. Good oral hygiene prevents tooth decay and gum disease.  . The frequency of eye exams is based on your age, health, family medical history, use  of contact lenses, and other factors. Follow your health care provider's ecommendations for frequency of eye exams.  . Eat a healthy diet. Foods like vegetables, fruits, whole grains, low-fat dairy products, and lean protein foods contain the nutrients you need without too many calories. Decrease your intake of foods high in solid fats, added sugars, and salt. Eat the right amount of calories for you. Get information about a proper  diet from your health care provider, if necessary.  . Regular physical exercise is one of the most important things you can do for your health. Most adults should get at least 150 minutes of moderate-intensity exercise (any activity that increases your heart rate and causes you to sweat) each week. In addition, most adults need muscle-strengthening exercises on 2 or more days a week.  Silver Sneakers may be a benefit available to you. To determine eligibility, you may visit the website: www.silversneakers.com or contact program at [phone] Mon-Fri between 8AM-8PM.   . Maintain a healthy weight. The body mass index (BMI) is a screening tool to identify possible weight problems. It provides an estimate of body fat based on height and weight. Your health care provider can find your BMI and can help you achieve or maintain a healthy weight.   For adults 20 years and older: ? A BMI below 18.5 is considered underweight. ? A BMI of 18.5 to 24.9 is normal. ? A BMI of 25 to 29.9 is considered overweight. ? A BMI of 30 and above is considered obese.   . Maintain normal blood lipids and cholesterol levels by exercising and minimizing your intake of saturated fat. Eat a balanced diet with plenty of fruit and vegetables. Blood tests for lipids and cholesterol should begin at age 29 and be repeated every 5 years. If  your lipid or cholesterol levels are high, you are over 50, or you are at high risk for heart disease, you may need your cholesterol levels checked more frequently. Ongoing high lipid and cholesterol levels should be treated with medicines if diet and exercise are not working.  . If you smoke, find out from your health care provider how to quit. If you do not use tobacco, please do not start.  . If you choose to drink alcohol, please do not consume more than 2 drinks per day. One drink is considered to be 12 ounces (355 mL) of beer, 5 ounces (148 mL) of wine, or 1.5 ounces (44 mL) of  liquor.  . If you are 57-86 years old, ask your health care provider if you should take aspirin to prevent strokes.  . Use sunscreen. Apply sunscreen liberally and repeatedly throughout the day. You should seek shade when your shadow is shorter than you. Protect yourself by wearing long sleeves, pants, a wide-brimmed hat, and sunglasses year round, whenever you are outdoors.  . Once a month, do a whole body skin exam, using a mirror to look at the skin on your back. Tell your health care provider of new moles, moles that have irregular borders, moles that are larger than a pencil eraser, or moles that have changed in shape or color.

## 2016-06-04 NOTE — Progress Notes (Signed)
PCP notes:  Health maintenance:  Shingles - postponed/insurance  Abnormal screenings:   Hearing - failed Fall risk - hx of multiple falls without injury  Patient concerns:   None  Nurse concerns:  None  Next PCP appt:   09/03/2016 @ 1030  I reviewed health advisor's note, was available for consultation on the day of service listed in this note, and agree with documentation and plan. Elsie Stain, MD.

## 2016-06-04 NOTE — Progress Notes (Signed)
Subjective:   Dean Murphy is a 83 y.o. male who presents for Medicare Annual/Subsequent preventive examination.  Review of Systems:  N/A Cardiac Risk Factors include: advanced age (>69men, >50 women);diabetes mellitus;dyslipidemia;hypertension;male gender     Objective:    Vitals: BP 140/70 (BP Location: Left Arm, Patient Position: Sitting, Cuff Size: Normal)   Pulse 66   Temp 98.4 F (36.9 C) (Oral)   Ht 5\' 8"  (1.727 m) Comment: no shoes  Wt 190 lb 12 oz (86.5 kg)   SpO2 97%   BMI 29.00 kg/m   Body mass index is 29 kg/m.  Tobacco History  Smoking Status  . Former Smoker  . Packs/day: 1.00  . Years: 25.00  . Types: Cigarettes  . Quit date: 01/21/1973  Smokeless Tobacco  . Never Used    Comment: began smoking in high school, quit age 30     Counseling given: No   Past Medical History:  Diagnosis Date  . Anemia   . Arthritis   . CAD (coronary artery disease)    a. CABG 1989, b. Myoview low risk 2012.  . Carotid artery occlusion    a. Duplex 10/2014: patent R/L CEA with mild hyperplasia in right surgical bulb - followed by vascular.  . CHB (complete heart block) [facility]) March 2016   a. s/p STJ dual chamber pacemaker 07/2014.  [name] Chronic diastolic CHF (congestive heart failure) ([place])    a. Dx [id number] - acute diastolic CHF in the setting of CHB.  [name] Chronic kidney disease, stage IV (severe) (HCC)    stage III/IV as of 2015, [name]  . CKD (chronic kidney disease), stage IV ([place])   . Colon polyps   . Complication of anesthesia   . Constipation   . CVA (cerebral infarction)   . Diabetes mellitus    type II  . Diverticulosis   . Dupuytren's disease    finger right hand contracted  . Essential hypertension   . GERD (gastroesophageal reflux disease)   . Hemorrhoids   . Hyperlipidemia   . Myocardial infarction 1976  and Mar. 19, 2016  . Orthostasis   . Osteoporosis   . PAF (paroxysmal atrial fibrillation) [facility]) March 2016  . PONV (postoperative nausea  and vomiting)   . Presence of permanent cardiac pacemaker   . Shortness of breath dyspnea    due to pain   Past Surgical History:  Procedure Laterality Date  . CARDIAC CATHETERIZATION  08/11/2014   Procedure: TEMPORARY PACEMAKER;  Surgeon: [name], MD;  Location: [facility] CATH LAB;  Service: Cardiovascular;;  . CAROTID ENDARTERECTOMY  12/31/10   Right  . CAROTID ENDARTERECTOMY  02/09/11   left  . CHOLECYSTECTOMY    . CORONARY ARTERY BYPASS GRAFT  1989  . EYE SURGERY Bilateral    Cataract with implants  . I&D KNEE WITH POLY EXCHANGE Left 07/29/2015   Procedure: IRRIGATION AND DEBRIDEMENT LEFT KNEE WITH POLY EXCHANGE;  Surgeon: Rod Can, MD;  Location: [place];  Service: Orthopedics;  Laterality: Left;  . IR GENERIC HISTORICAL  03/04/2016   IR US GUIDE VASC ACCESS RIGHT 03/04/2016 Marybelle Killings, MD [facility]-INTERV RAD  . IR GENERIC HISTORICAL  03/04/2016   IR FLUORO GUIDE CV LINE RIGHT 03/04/2016 Marybelle Killings, MD [facility]INTER[facility]  . JOINT REPLACEMENT     bilat. knees  . PERMANENT PACEMAKER INSERTION N/A 08/13/2014   STJ dual chamber pacemaker implanted by [name] for CHB  . TONSILLECTOMY    . TOTAL KNEE ARTHROPLASTY  bilateral   Family History  Problem Relation Age of Onset  . Heart disease Mother     Before age 81  . Diabetes Mother   . Varicose Veins Mother   . Heart attack Mother   . Heart attack Father   . Heart disease Father     After age 6  . Hypertension Father   . Heart disease Brother     Before age 45  . Hypertension Brother   . Heart attack Brother   . Diabetes Son   . Hypertension Son   . Hypertension Son   . Diabetes Son   . Diabetes    . Cancer    . Colon cancer Neg Hx    History  Sexual Activity  . Sexual activity: No    Outpatient Encounter Prescriptions as of 06/04/2016  Medication Sig  . AMITIZA 8 MCG capsule TAKE ONE CAPSULE BY MOUTH EVERY MORNING WITH BREAKFAST  . amoxicillin (AMOXIL) 500 MG capsule Take 1 capsule (500 mg total) by mouth 2  (two) times daily.  [name] apixaban (ELIQUIS) 2.5 MG TABS tablet Take 1 tablet (2.5 mg total) by mouth 2 (two) times daily.  . clotrimazole-betamethasone (LOTRISONE) cream Apply 1 application topically 2 (two) times daily as needed.  . diltiazem (TIAZAC) 360 MG 24 hr capsule Take 1 capsule (360 mg total) by mouth daily.  . ferrous sulfate ([employer] IRON) 325 (65 FE) MG tablet Twice a day on Mon-Fri, skip weekends (Patient taking differently: Once a day on Mon-Fri, skip weekends)  . glipiZIDE (GLUCOTROL) 5 MG tablet Take 1 tablet (5 mg total) by mouth daily before breakfast.  . JANUVIA 50 MG tablet TAKE 1 TABLET BY MOUTH DAILY  . omeprazole (PRILOSEC) 40 MG capsule TAKE ONE CAPSULE BY MOUTH DAILY  . Ophthalmic Irrigation Solution (EYE WASH) 99.05 % SOLN Place 1 drop into both eyes 2 (two) times daily.  . rosuvastatin (CRESTOR) 20 MG tablet Take 1 tablet (20 mg total) by mouth daily.   No facility-administered encounter medications on file as of 06/04/2016.     Activities of Daily Living In your present state of health, do you have any difficulty performing the following activities: 06/04/2016 07/26/2015  Hearing? Y N  Vision? N N  Difficulty concentrating or making decisions? N N  Walking or climbing stairs? Y Y  Dressing or bathing? N N  Doing errands, shopping? N Y  Conservation officer, nature and eating ? N -  Using the Toilet? N -  In the past six months, have you accidently leaked urine? N -  Do you have problems with loss of bowel control? N -  Managing your Medications? N -  Managing your Finances? N -  Housekeeping or managing your Housekeeping? N -  Some recent data might be hidden    Patient Care Team: Tonia Ghent, MD as PCP - General (Family Medicine) Patsey Berthold, NP as Nurse Practitioner (Cardiology) Murlean Iba, MD (Internal Medicine) Katy Apo, MD as Consulting Physician (Ophthalmology) Dorothy Spark, MD as Consulting Physician (Cardiology) Eula Listen, DDS as Referring  Physician (Dentistry) Danella Sensing, MD as Consulting Physician (Dermatology) Rod Can, MD as Consulting Physician (Orthopedic Surgery) Evans Lance, MD as Consulting Physician (Cardiology) Michel Bickers, MD as Consulting Physician (Infectious Diseases)   Assessment:     Hearing Screening   125Hz  250Hz  500Hz  1000Hz  2000Hz  3000Hz  4000Hz  6000Hz  8000Hz   Right ear:   0 40 40  0    Left ear:   0 40  40  0    Vision Screening Comments: Last vision exam in Oct 2017 with Dr. Prudencio Burly   Exercise Activities and Dietary recommendations Current Exercise Habits: The patient does not participate in regular exercise at present, Exercise limited by: orthopedic condition(s)  Goals    . Increase water intake          Starting 06/05/2016, I will attempt to drink at least 8 oz water with each meal daily.       Fall Risk Fall Risk  06/04/2016 03/24/2016 02/27/2016 10/17/2015 08/29/2015  Falls in the past year? Yes No No No No  Number falls in past yr: 2 or more - - - -  Injury with Fall? No - - - -   Depression Screen PHQ 2/9 Scores 06/04/2016 03/24/2016 02/27/2016 10/17/2015  PHQ - 2 Score 0 0 0 0    Cognitive Function MMSE - Mini Mental State Exam 06/04/2016  Orientation to time 5  Orientation to Place 5  Registration 3  Attention/ Calculation 0  Recall 3  Language- name 2 objects 0  Language- repeat 1  Language- follow 3 step command 3  Language- read & follow direction 0  Write a sentence 0  Copy design 0  Total score 20     PLEASE NOTE: A Mini-Cog screen was completed. Maximum score is 20. A value of 0 denotes this part of [place] MMSE was not completed or the patient failed this part of the Mini-Cog screening.   Mini-Cog Screening Orientation to Time - Max 5 pts Orientation to Place - Max 5 pts Registration - Max 3 pts Recall - Max 3 pts Language Repeat - Max 1 pts Language Follow 3 Step Command - Max 3 pts     Immunization History  Administered Date(s) Administered  .  Influenza Split 02/03/2012  . Influenza Whole 03/16/2007, 01/23/2010  . Influenza, High Dose Seasonal PF 03/27/2013  . Influenza,inj,Quad PF,36+ Mos 03/15/2014, 03/15/2015, 02/27/2016  . Pneumococcal Conjugate-13 06/24/2015  . Pneumococcal Polysaccharide-23 09/24/2009  . Td 07/26/2008   Screening Tests Health Maintenance  Topic Date Due  . ZOSTAVAX  06/04/2017 (Originally 03/31/1989)  . HEMOGLOBIN A1C  10/18/2016  . OPHTHALMOLOGY EXAM  04/03/2017  . FOOT EXAM  04/23/2017  . TETANUS/TDAP  07/27/2018  . INFLUENZA VACCINE  Completed  . PNA vac Low Risk Adult  Completed      Plan:     I have personally reviewed and addressed the Medicare Annual Wellness questionnaire and have noted the following in the patient's chart:  A. Medical and social history B. Use of alcohol, tobacco or illicit drugs  C. Current medications and supplements D. Functional ability and status E.  Nutritional status F.  Physical activity G. Advance directives H. List of other physicians I.  Hospitalizations, surgeries, and ER visits in previous 12 months J.  [place] to include hearing, vision, cognitive, depression L. Referrals and appointments - none  In addition, I have reviewed and discussed with patient certain preventive protocols, quality metrics, and best practice recommendations. A written personalized care plan for preventive services as well as general preventive health recommendations were provided to patient.  See attached scanned questionnaire for additional information.   Signed,   Lindell Noe, MHA, BS, LPN Health Coach

## 2016-06-08 ENCOUNTER — Other Ambulatory Visit: Payer: Self-pay | Admitting: Family Medicine

## 2016-06-08 DIAGNOSIS — E1122 Type 2 diabetes mellitus with diabetic chronic kidney disease: Secondary | ICD-10-CM

## 2016-06-08 DIAGNOSIS — D508 Other iron deficiency anemias: Secondary | ICD-10-CM

## 2016-06-09 ENCOUNTER — Ambulatory Visit (INDEPENDENT_AMBULATORY_CARE_PROVIDER_SITE_OTHER): Payer: Medicare Other | Admitting: Internal Medicine

## 2016-06-09 ENCOUNTER — Encounter: Payer: Self-pay | Admitting: Internal Medicine

## 2016-06-09 DIAGNOSIS — T8454XD Infection and inflammatory reaction due to internal left knee prosthesis, subsequent encounter: Secondary | ICD-10-CM | POA: Diagnosis not present

## 2016-06-09 NOTE — Progress Notes (Signed)
[place] for Infectious Disease  Patient Active Problem List   Diagnosis Date Noted  . Infection of prosthetic left knee joint ([place]) 07/29/2015    Priority: High  . Coronary artery disease 07/29/2015  . Stroke ([place]) 12/24/2014  . Double vision 12/23/2014  . Pacemaker 11/16/2014  . Anemia 09/27/2014  . Gastrointestinal hemorrhage associated with peptic ulcer 09/07/2014  . Atrial fibrillation-CHADS VASC2 = 5 (age, HTN, DM, Vasc) 08/13/2014  . Chest pain 08/11/2014  . Complete heart block ([place]) 08/11/2014  . S/P CABG x 5 1989. Low risk Myoview 2012 08/11/2014  . Syncope 08/11/2014  . Cough 06/14/2014  . Left foot pain 02/23/2014  . Gouty arthropathy 08/09/2013  . Chronic radicular lumbar pain 12/15/2012  . PVD- s/p bilat CEA- CA dopplers OK Jan 2015 10/06/2012  . Insomnia 09/30/2011  . OTHER SPECIFIED SITES OF SPRAINS AND STRAINS 04/25/2010  . DIVERTICULITIS OF COLON 02/21/2010  . TEMPOROMANDIBULAR JOINT DISORDER 11/22/2009  . DM (diabetes mellitus), type 2 with renal complications ([place]) [id number]  . Chronic kidney disease, stage III (moderate) 07/26/2008  . CHRONIC PROSTATITIS 07/26/2008  . ADVEF, DRUG/MEDICINAL/BIOLOGICAL SUBST NOS 03/16/2007  . Dyslipidemia 11/15/2006  . Essential hypertension 11/15/2006  . GERD 11/15/2006  . Osteoarthritis 11/15/2006    Patient's Medications  New Prescriptions   No medications on file  Previous Medications   AMITIZA 8 MCG CAPSULE    TAKE ONE CAPSULE BY MOUTH EVERY MORNING WITH BREAKFAST   AMOXICILLIN (AMOXIL) 500 MG CAPSULE    Take 1 capsule (500 mg total) by mouth 2 (two) times daily.   APIXABAN (ELIQUIS) 2.5 MG TABS TABLET    Take 1 tablet (2.5 mg total) by mouth 2 (two) times daily.   CLOTRIMAZOLE-BETAMETHASONE (LOTRISONE) CREAM    Apply 1 application topically 2 (two) times daily as needed.   DILTIAZEM (TIAZAC) 360 MG 24 HR CAPSULE    Take 1 capsule (360 mg total) by mouth daily.   FERROUS SULFATE ([employer]S IRON) 325  (65 FE) MG TABLET    Twice a day on Mon-Fri, skip weekends   GLIPIZIDE (GLUCOTROL) 5 MG TABLET    Take 1 tablet (5 mg total) by mouth daily before breakfast.   JANUVIA 50 MG TABLET    TAKE 1 TABLET BY MOUTH DAILY   OMEPRAZOLE (PRILOSEC) 40 MG CAPSULE    TAKE ONE CAPSULE BY MOUTH DAILY   OPHTHALMIC IRRIGATION SOLUTION (EYE WASH) 99.05 % SOLN    Place 1 drop into both eyes 2 (two) times daily.   ROSUVASTATIN (CRESTOR) 20 MG TABLET    Take 1 tablet (20 mg total) by mouth daily.  Modified Medications   No medications on file  Discontinued Medications   No medications on file    Subjective: Mr. Winterrowd is in with his wife and son for his routine follow-up visit. 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 He completed 6 weeks of IV vancomycin on 04/14/2016 and then switched over to oral amoxicillin. He has had no problems tolerating the amoxicillin and is taking it twice daily. He is not requiring any pain medication and he is back to all of his usual activities. He will occasionally have a sharp, fleeting pain on the medial aspect of his left knee but it is never bad enough that he feels he needs to take any medication for it.  Review of Systems: Review of Systems  Constitutional: Negative for chills, diaphoresis, fever and weight  loss.  Gastrointestinal: Negative for abdominal pain, diarrhea, heartburn, nausea and vomiting.  Musculoskeletal: Positive for joint pain.       As noted in history of present illness.    Past Medical History:  Diagnosis Date  . Anemia   . Arthritis   . CAD (coronary artery disease)    a. CABG 1989, b. Myoview low risk 2012.  . Carotid artery occlusion    a. Duplex 10/2014: patent R/L CEA with mild hyperplasia in right surgical bulb - followed by vascular.  . CHB (complete heart block) [facility]) March 2016   a. s/p STJ dual chamber pacemaker 07/2014.  [name] Chronic diastolic CHF (congestive heart failure) ([place])    a. Dx [id number] -  acute diastolic CHF in the setting of CHB.  [name] Chronic kidney disease, stage IV (severe) (HCC)    stage III/IV as of 2015, [name]  . CKD (chronic kidney disease), stage IV ([place])   . Colon polyps   . Complication of anesthesia   . Constipation   . CVA (cerebral infarction)   . Diabetes mellitus    type II  . Diverticulosis   . Dupuytren's disease    finger right hand contracted  . Essential hypertension   . GERD (gastroesophageal reflux disease)   . Hemorrhoids   . Hyperlipidemia   . Myocardial infarction 1976  and Mar. 19, 2016  . Orthostasis   . Osteoporosis   . PAF (paroxysmal atrial fibrillation)  [facility] March 2016  . PONV (postoperative nausea and vomiting)   . Presence of permanent cardiac pacemaker   . Shortness of breath dyspnea    due to pain    Social History  Substance Use Topics  . Smoking status: Former Smoker    Packs/day: 1.00    Years: 25.00    Types: Cigarettes    Quit date: 01/21/1973  . Smokeless tobacco: Never Used     Comment: began smoking in high school, quit age 53  . Alcohol use 0.0 oz/week     Comment: 1-2 per week    Family History  Problem Relation Age of Onset  . Heart disease Mother     Before age 58  . Diabetes Mother   . Varicose Veins Mother   . Heart attack Mother   . Heart attack Father   . Heart disease Father     After age 15  . Hypertension Father   . Heart disease Brother     Before age 73  . Hypertension Brother   . Heart attack Brother   . Diabetes Son   . Hypertension Son   . Hypertension Son   . Diabetes Son   . Diabetes    . Cancer    . Colon cancer Neg Hx     Allergies  Allergen Reactions  . Reclast [Zoledronic Acid] Other (See Comments)    Stopped 2015 due to Creatine     Objective: Vitals:   06/09/16 0952  BP: (!) 159/60  Pulse: 76  Temp: 98.6 F (37 C)  TempSrc: Oral  Weight: 195 lb (88.5 kg)   Body mass index is 29.65 kg/m.  Physical Exam  Constitutional: He is oriented to  person, place, and time.  He is in good spirits.  Cardiovascular: Normal rate and regular rhythm.   No murmur heard. Pulmonary/Chest: Effort normal and breath sounds normal.  Abdominal: Soft. There is no tenderness.  Musculoskeletal: He exhibits no edema or tenderness.  He has no swelling, warmth or tenderness  of his left knee. His surgical incision is well-healed. He has full extension and flexion to about 90.  Neurological: He is alert and oriented to person, place, and time. Gait normal.  Skin: No rash noted.  Psychiatric: Mood and affect normal.    Lab Results    Problem List Items Addressed This Visit      High   Infection of prosthetic left knee joint ([place])    He is doing very well on chronic, suppressive amoxicillin for his relapsed enterococcal prosthetic knee infection. He will continue amoxicillin and follow-up in 6 months.          Michel Bickers, MD [facility] for [facility] [phone] pager   [phone] cell 06/09/2016, 10:06 AM

## 2016-06-09 NOTE — Assessment & Plan Note (Signed)
He is doing very well on chronic, suppressive amoxicillin for his relapsed enterococcal prosthetic knee infection. He will continue amoxicillin and follow-up in 6 months.

## 2016-06-20 ENCOUNTER — Other Ambulatory Visit: Payer: Self-pay | Admitting: Family Medicine

## 2016-06-30 ENCOUNTER — Ambulatory Visit (INDEPENDENT_AMBULATORY_CARE_PROVIDER_SITE_OTHER): Payer: Medicare Other | Admitting: *Deleted

## 2016-06-30 DIAGNOSIS — I442 Atrioventricular block, complete: Secondary | ICD-10-CM | POA: Diagnosis not present

## 2016-06-30 NOTE — Progress Notes (Signed)
Remote pacemaker transmission.   

## 2016-07-02 ENCOUNTER — Encounter: Payer: Self-pay | Admitting: Cardiology

## 2016-07-03 ENCOUNTER — Encounter: Payer: Self-pay | Admitting: Cardiology

## 2016-07-03 LAB — CUP PACEART REMOTE DEVICE CHECK
Battery Remaining Longevity: 124 mo
Brady Statistic AP VS Percent: 1 %
Brady Statistic AS VP Percent: 73 %
Implantable Lead Location: 753859
Lead Channel Impedance Value: 430 Ohm
Lead Channel Pacing Threshold Amplitude: 0.5 V
Lead Channel Pacing Threshold Amplitude: 0.5 V
Lead Channel Sensing Intrinsic Amplitude: 12 mV
Lead Channel Setting Pacing Amplitude: 0.75 V
Lead Channel Setting Pacing Amplitude: 2 V
Lead Channel Setting Pacing Pulse Width: 0.5 ms
MDC IDC LEAD IMPLANT DT: 20160321
MDC IDC LEAD IMPLANT DT: 20160321
MDC IDC LEAD LOCATION: 753860
MDC IDC MSMT BATTERY REMAINING PERCENTAGE: 95.5 %
MDC IDC MSMT BATTERY VOLTAGE: 3.01 V
MDC IDC MSMT LEADCHNL RA PACING THRESHOLD PULSEWIDTH: 0.5 ms
MDC IDC MSMT LEADCHNL RA SENSING INTR AMPL: 2 mV
MDC IDC MSMT LEADCHNL RV IMPEDANCE VALUE: 550 Ohm
MDC IDC MSMT LEADCHNL RV PACING THRESHOLD PULSEWIDTH: 0.5 ms
MDC IDC PG IMPLANT DT: 20160321
MDC IDC SESS DTM: 20180206144649
MDC IDC SET LEADCHNL RV SENSING SENSITIVITY: 4 mV
MDC IDC STAT BRADY AP VP PERCENT: 27 %
MDC IDC STAT BRADY AS VS PERCENT: 1 %
MDC IDC STAT BRADY RA PERCENT PACED: 27 %
MDC IDC STAT BRADY RV PERCENT PACED: 99 %
Pulse Gen Model: 2240
Pulse Gen Serial Number: 7734710

## 2016-07-06 ENCOUNTER — Ambulatory Visit (INDEPENDENT_AMBULATORY_CARE_PROVIDER_SITE_OTHER): Payer: Medicare Other | Admitting: Cardiology

## 2016-07-06 ENCOUNTER — Encounter (INDEPENDENT_AMBULATORY_CARE_PROVIDER_SITE_OTHER): Payer: Self-pay

## 2016-07-06 ENCOUNTER — Encounter: Payer: Self-pay | Admitting: Cardiology

## 2016-07-06 VITALS — BP 124/74 | HR 69 | Ht 68.0 in | Wt 199.0 lb

## 2016-07-06 DIAGNOSIS — M7989 Other specified soft tissue disorders: Secondary | ICD-10-CM | POA: Diagnosis not present

## 2016-07-06 DIAGNOSIS — I10 Essential (primary) hypertension: Secondary | ICD-10-CM | POA: Diagnosis not present

## 2016-07-06 DIAGNOSIS — I2510 Atherosclerotic heart disease of native coronary artery without angina pectoris: Secondary | ICD-10-CM

## 2016-07-06 DIAGNOSIS — E785 Hyperlipidemia, unspecified: Secondary | ICD-10-CM

## 2016-07-06 DIAGNOSIS — I442 Atrioventricular block, complete: Secondary | ICD-10-CM | POA: Diagnosis not present

## 2016-07-06 DIAGNOSIS — Z9889 Other specified postprocedural states: Secondary | ICD-10-CM

## 2016-07-06 NOTE — Progress Notes (Signed)
Patient ID: Dean Murphy, male   DOB: 07-Dec-1926, 83 y.o.   MRN: [id number]     Cardiology Office Note Date:  07/06/2016  Patient ID:  Dean Murphy, Dean Murphy [birth year], MRN [id number] PCP:  Elsie Stain, MD  Cardiologist: Dr. Meda Coffee  Chief Complaint: f/u chest pain, low BP  History of Present Illness: Dean Murphy is a 83 y.o. male with history of CAD s/p CABG 1989, CKD stage IV, HTN with history of orthostasis, DM, anemia, CHB [id number] (complicated by acute diastolic CHF) s/p PPM, CVA, carotid disease s/p CEAs (followed by [facility]), hyperlipidemia, PAF who presents for f/u exertional fatigue and low BP. He was seen by Melina Copa in 02/2015 for decreased stamina and tendency toward lower blood pressures. Dr. Damita Dunnings cut his amlodipine in half to 2.5mg  daily. Since that time the patient says he feels "100% better." Even his sons have noticed that he has had increased stamina. Dyspnea has improved tremendously and he no longer feels lethargic. He has not had any chest pain or pressure. He follows his BP at home and this AM got a reading of 130/75.  12/30/2015  - patient is coming after 6 months, he states that he feels great he is planning a trip to the beach with his wife and friends. He has been compliant to his medicines denies any chest pain shortness of breath. 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 Otherwise he has no orthopnea proximal nocturnal dyspnea no palpitations or syncope. He has been seen by Dr. Lovena Le for pacemaker checkup and his pacemaker is working properly. Since we discontinued his amlodipine at the last visit his lower extremity edema has resolved. He also feels better from dizziness standpoint.  07/06/2016 - the patient is coming after 6 months, cardio-wise he feels great he denies any chest pain shortness of breath she has minimal left lower extremity edema, he is no palpitations or falls. His major  concern is his left knee where he developed osteomyelitis that led to knee replacement with improvement for 5 months however get reinfected and he has been on antibiotics for several months now. He is planning crus to [facility] next week. His pacemaker was checked last 3 functioning well. No bleeding with Eliquis  Last echo 07/2014 showed mild LVH, EF [id number], normal diastolic function, mild MR.  Past Medical History:  Diagnosis Date  . Anemia   . Arthritis   . CAD (coronary artery disease)    a. CABG 1989, b. Myoview low risk 2012.  . Carotid artery occlusion    a. Duplex 10/2014: patent R/L CEA with mild hyperplasia in right surgical bulb - followed by vascular.  . CHB (complete heart block) [facility]) March 2016   a. s/p STJ dual chamber pacemaker 07/2014.  [name] Chronic diastolic CHF (congestive heart failure) ([place])    a. Dx [id number] - acute diastolic CHF in the setting of CHB.  [name] Chronic kidney disease, stage IV (severe) (HCC)    stage III/IV as of 2015, [name]  . CKD (chronic kidney disease), stage IV ([place])   . Colon polyps   . Complication of anesthesia   . Constipation   . CVA (cerebral infarction)   . Diabetes mellitus    type II  . Diverticulosis   . Dupuytren's disease    finger right hand contracted  . Essential hypertension   . GERD (gastroesophageal reflux disease)   .  Hemorrhoids   . Hyperlipidemia   . Myocardial infarction 1976  and Mar. 19, 2016  . Orthostasis   . Osteoporosis   . PAF (paroxysmal atrial fibrillation) [facility]) March 2016  . PONV (postoperative nausea and vomiting)   . Presence of permanent cardiac pacemaker   . Shortness of breath dyspnea    due to pain    Past Surgical History:  Procedure Laterality Date  . CARDIAC CATHETERIZATION  08/11/2014   Procedure: TEMPORARY PACEMAKER;  Surgeon: [name], MD;  Location: [facility] CATH LAB;  Service: Cardiovascular;;  . CAROTID ENDARTERECTOMY  12/31/10   Right  . CAROTID ENDARTERECTOMY  02/09/11     left  . CHOLECYSTECTOMY    . CORONARY ARTERY BYPASS GRAFT  1989  . EYE SURGERY Bilateral    Cataract with implants  . I&D KNEE WITH POLY EXCHANGE Left 07/29/2015   Procedure: IRRIGATION AND DEBRIDEMENT LEFT KNEE WITH POLY EXCHANGE;  Surgeon: Rod Can, MD;  Location: [place];  Service: Orthopedics;  Laterality: Left;  . IR GENERIC HISTORICAL  03/04/2016   IR US GUIDE VASC ACCESS RIGHT 03/04/2016 Marybelle Killings, MD [facility]  . IR GENERIC HISTORICAL  03/04/2016   IR FLUORO GUIDE CV LINE RIGHT 03/04/2016 Marybelle Killings, MD [facility]  . JOINT REPLACEMENT     bilat. knees  . PERMANENT PACEMAKER INSERTION N/A 08/13/2014   STJ dual chamber pacemaker implanted by [name] for CHB  . TONSILLECTOMY    . TOTAL KNEE ARTHROPLASTY     bilateral    Current Outpatient Prescriptions  Medication Sig Dispense Refill  . AMITIZA 8 MCG capsule TAKE ONE CAPSULE BY MOUTH EVERY MORNING WITH BREAKFAST 90 capsule 1  . amoxicillin (AMOXIL) 500 MG capsule Take 1 capsule (500 mg total) by mouth 2 (two) times daily. 180 capsule 11  . apixaban (ELIQUIS) 2.5 MG TABS tablet Take 1 tablet (2.5 mg total) by mouth 2 (two) times daily. 180 tablet 2  . clotrimazole-betamethasone (LOTRISONE) cream Apply 1 application topically 2 (two) times daily as needed. 30 g 0  . diltiazem (TIAZAC) 360 MG 24 hr capsule Take 1 capsule (360 mg total) by mouth daily. 90 capsule 3  . ferrous sulfate ([facility] IRON) 325 (65 FE) MG tablet Twice a day on Mon-Fri, skip weekends (Patient taking differently: Once a day on Mon-Fri, skip weekends)    . glipiZIDE (GLUCOTROL) 5 MG tablet TAKE TWO TABLETS BY MOUTH DAILY BEFORE BREAKFAST AND ONE TABLET BY MOUTH BEFORESUPPER 270 tablet 1  . JANUVIA 50 MG tablet TAKE 1 TABLET BY MOUTH DAILY 90 tablet 1  . omeprazole (PRILOSEC) 40 MG capsule TAKE ONE CAPSULE BY MOUTH DAILY 30 capsule 11  . Ophthalmic Irrigation Solution (EYE WASH) 99.05 % SOLN Place 1 drop into both eyes 2 (two) times daily.    .  rosuvastatin (CRESTOR) 20 MG tablet Take 1 tablet (20 mg total) by mouth daily. 90 tablet 3   No current facility-administered medications for this visit.     Allergies:   Reclast [zoledronic acid]   Social History:  The patient  reports that he quit smoking about 43 years ago. His smoking use included Cigarettes. He has a 25.00 pack-year smoking history. He has never used smokeless tobacco. He reports that he drinks alcohol. He reports that he does not use drugs.   Family History:  The patient's family history includes Diabetes in his mother, son, and son; Heart attack in his brother, father, and mother; Heart disease in his brother, father,  and mother; Hypertension in his brother, father, son, and son; Varicose Veins in his mother.  ROS:  Please see the history of present illness.   All other systems are reviewed and otherwise negative.   PHYSICAL EXAM:  VS:  BP 124/74   Pulse 69   Ht 5\' 8"  (1.727 m)   Wt 199 lb (90.3 kg)   BMI 30.26 kg/m  BMI: Body mass index is 30.26 kg/m. Well nourished, well developed WM, in no acute distress - lively, acts younger than stated age  23: normocephalic, atraumatic  Neck: no JVD, +right carotid bruit, no masses Cardiac:  normal S1, S2; RRR; no murmurs, rubs, or gallops Lungs:  clear to auscultation bilaterally, no wheezing, rhonchi or rales  Abd: soft, nontender, no hepatomegaly, + BS MS: no deformity or atrophy Ext: no edema  Skin: warm and dry, no rash Neuro:  moves all extremities spontaneously, no focal abnormalities noted, follows commands Psych: euthymic mood, full affect   EKG:  Done today shows NSR 65bpm, LBBB, left axis deviation, 1st degree AVB - unchanged from earlier this year.  Recent Labs: 12/24/2015: ALT 13; TSH 1.61 02/27/2016: BUN 34; Creat 2.49; Potassium 5.4; Sodium 139 06/04/2016: Hemoglobin 13.2; Platelets 160.0  12/24/2015: Cholesterol 125; HDL 52.30; LDL Cholesterol 53; Total CHOL/HDL Ratio 2; Triglycerides 98.0; VLDL  19.6   CrCl cannot be calculated (Patient's most recent lab result is older than the maximum 21 days allowed.).   Wt Readings from Last 3 Encounters:  07/06/16 199 lb (90.3 kg)  06/09/16 195 lb (88.5 kg)  06/04/16 190 lb 12 oz (86.5 kg)    EKG 07/06/2016, normal sinus rhythm, the paced rhythm, unchanged from prior.  Other studies reviewed: Additional studies/records reviewed today include: summarized above    ASSESSMENT AND PLAN:  1. Lower extremity edema and dizziness - resolved after discontinuation of amlodipine, his blood pressure is normal and the patient asymptomatic.  2. CAD - patient is asymptomatic we'll continue current regimen he is encouraged to start exercise once his knees healed..  3. Hypertension -controlled on current regimen. 4. Paroxysmal atrial fibrillation - maintaining NSR. Continue Eliquis. No bleeding, stable Hb. 5. Chronic diastolic CHF - appears euvolemic. Lower extremity edema resolved after discontinuation of amlodipine.Now minimal left lower extremity edema associated with osteomyelitis.  6. Carotid disease - s/p B/L endarterectomy in 2012, stable carotid US in 10/2014, followed by Dr Oneida Alar.  Follow up in 6 months.  Signed, Ena Dawley, MD  07/06/2016 9:00 AM     [facility] [place] [address] [phone] (office)  [phone] (fax)

## 2016-07-06 NOTE — Patient Instructions (Signed)

## 2016-07-25 ENCOUNTER — Other Ambulatory Visit: Payer: Self-pay | Admitting: Cardiology

## 2016-08-10 LAB — BASIC METABOLIC PANEL
Creatinine: 1.7 mg/dL — AB (ref <=1.3)
Glucose: 217 mg/dL
POTASSIUM: 4.6 mmol/L (ref 3.4–5.3)

## 2016-08-10 LAB — CBC AND DIFFERENTIAL
Hemoglobin: 13.8 g/dL (ref 13.5–17.5)
PLATELETS: 146 10*3/uL — AB (ref 150–399)

## 2016-08-18 ENCOUNTER — Encounter: Payer: Self-pay | Admitting: Family Medicine

## 2016-08-27 ENCOUNTER — Other Ambulatory Visit (INDEPENDENT_AMBULATORY_CARE_PROVIDER_SITE_OTHER): Payer: Medicare Other

## 2016-08-27 ENCOUNTER — Encounter (INDEPENDENT_AMBULATORY_CARE_PROVIDER_SITE_OTHER): Payer: Self-pay

## 2016-08-27 DIAGNOSIS — D508 Other iron deficiency anemias: Secondary | ICD-10-CM

## 2016-08-27 DIAGNOSIS — E1122 Type 2 diabetes mellitus with diabetic chronic kidney disease: Secondary | ICD-10-CM | POA: Diagnosis not present

## 2016-08-27 LAB — CBC WITH DIFFERENTIAL/PLATELET
BASOS ABS: 0.1 10*3/uL (ref 0.0–0.1)
Basophils Relative: 0.8 % (ref 0.0–3.0)
EOS PCT: 2.8 % (ref 0.0–5.0)
Eosinophils Absolute: 0.3 10*3/uL (ref 0.0–0.7)
HCT: 43.5 % (ref 39.0–52.0)
Hemoglobin: 14.5 g/dL (ref 13.0–17.0)
LYMPHS PCT: 31.7 % (ref 12.0–46.0)
Lymphs Abs: 2.8 10*3/uL (ref 0.7–4.0)
MCHC: 33.4 g/dL (ref 30.0–36.0)
MCV: 88.7 fl (ref 78.0–100.0)
MONOS PCT: 6.1 % (ref 3.0–12.0)
Monocytes Absolute: 0.5 10*3/uL (ref 0.1–1.0)
NEUTROS ABS: 5.2 10*3/uL (ref 1.4–7.7)
Neutrophils Relative %: 58.6 % (ref 43.0–77.0)
Platelets: 152 10*3/uL (ref 150.0–400.0)
RBC: 4.9 Mil/uL (ref 4.22–5.81)
RDW: 14.8 % (ref 11.5–15.5)
WBC: 8.9 10*3/uL (ref 4.0–10.5)

## 2016-08-27 LAB — BASIC METABOLIC PANEL
BUN: 20 mg/dL (ref 6–23)
CHLORIDE: 107 meq/L (ref 96–112)
CO2: 29 meq/L (ref 19–32)
CREATININE: 1.72 mg/dL — AB (ref 0.40–1.50)
Calcium: 9.4 mg/dL (ref 8.4–10.5)
GFR: 40.14 mL/min — ABNORMAL LOW (ref >60.00)
Glucose, Bld: 230 mg/dL — ABNORMAL HIGH (ref 70–99)
Potassium: 4.6 mEq/L (ref 3.5–5.1)
Sodium: 140 mEq/L (ref 135–145)

## 2016-08-27 LAB — HEMOGLOBIN A1C: Hgb A1c MFr Bld: 8 % — ABNORMAL HIGH (ref 4.6–6.5)

## 2016-08-27 LAB — IBC PANEL
IRON: 75 ug/dL (ref 42–165)
Saturation Ratios: 21.7 % (ref 20.0–50.0)
Transferrin: 247 mg/dL (ref 212.0–360.0)

## 2016-09-03 ENCOUNTER — Encounter: Payer: Self-pay | Admitting: Family Medicine

## 2016-09-03 ENCOUNTER — Ambulatory Visit (INDEPENDENT_AMBULATORY_CARE_PROVIDER_SITE_OTHER): Payer: Medicare Other | Admitting: Family Medicine

## 2016-09-03 VITALS — BP 164/78 | HR 71 | Temp 97.6°F | Wt 201.5 lb

## 2016-09-03 DIAGNOSIS — N183 Chronic kidney disease, stage 3 unspecified: Secondary | ICD-10-CM

## 2016-09-03 DIAGNOSIS — I10 Essential (primary) hypertension: Secondary | ICD-10-CM

## 2016-09-03 DIAGNOSIS — D649 Anemia, unspecified: Secondary | ICD-10-CM | POA: Diagnosis not present

## 2016-09-03 DIAGNOSIS — E119 Type 2 diabetes mellitus without complications: Secondary | ICD-10-CM

## 2016-09-03 DIAGNOSIS — E1122 Type 2 diabetes mellitus with diabetic chronic kidney disease: Secondary | ICD-10-CM | POA: Diagnosis not present

## 2016-09-03 DIAGNOSIS — T8454XD Infection and inflammatory reaction due to internal left knee prosthesis, subsequent encounter: Secondary | ICD-10-CM

## 2016-09-03 MED ORDER — AMLODIPINE BESYLATE 2.5 MG PO TABS: 2.5000 mg | ORAL_TABLET | Freq: Every day | ORAL | 3 refills | Status: DC

## 2016-09-03 MED ORDER — OMEPRAZOLE 40 MG PO CPDR: 40.0000 mg | DELAYED_RELEASE_CAPSULE | Freq: Every day | ORAL | 3 refills | Status: DC

## 2016-09-03 NOTE — Assessment & Plan Note (Signed)
A1c up some in the meantime. Unclear how much of this is due to increase in hemoglobin, meaning some of his previous A1cs may have been artificially depressed. Discussed with patient. Work on diet and exercise as much as possible. No med changes at this point. Recheck A1c later this year. He agrees.

## 2016-09-03 NOTE — Patient Instructions (Addendum)
Don't change your regular meds for now.  Work on [employer] and exercise as much as you can in the meantime.  Recheck labs in about 3-4 months prior to a visit.   Check your BP at home.  If consistently >140/>90, then add on amlodipine once a day and update me.    Take care.  Glad to see you.

## 2016-09-03 NOTE — Assessment & Plan Note (Signed)
Improved. Still on PPI. Labs discussed with patient. Iron improved. Continue as is. We can recheck periodically.

## 2016-09-03 NOTE — Assessment & Plan Note (Signed)
Continue Amoxil suppression. Doing well compared to previous. No fevers.

## 2016-09-03 NOTE — Assessment & Plan Note (Signed)
If blood pressure remains elevated on home check an add on 2.5 mg amlodipine per day and update me as needed. He agrees. See after visit summary.

## 2016-09-03 NOTE — Assessment & Plan Note (Signed)
Creatinine stable. Discussed with patient about avoiding NSAIDs.

## 2016-09-03 NOTE — Progress Notes (Signed)
Hypertension:    Using medication without problems or lightheadedness: yes Chest pain with exertion: no Edema:no Short of breath:no Average home BPs: occ checked, not often.    Diabetes:  Using medications without difficulties: yes Hypoglycemic episodes:no Hyperglycemic episodes:no Feet problems:some occ B foot tingling, less on R, more on the L Blood Sugars averaging: ~140 in the AM, with diet variation.   eye exam within last year: yes A1c up some in the meantime.    Anemia.  HGB back to normal, iron level wnl.  Still on PPI, needed refill.  Labs discussed with patient. No bleeding.  Knee is better than prev with amoxil suppression.  Still with some pain after a busy day.    CKD.  Cr improved from prev baseline, d/w pt.  Not on nsaids except for maybe 1 dose of aleve per month.  D/w pt.  He usually takes tylenol instead, d/w pt.     PMH and SH reviewed  Meds, vitals, and allergies reviewed.   ROS: Per HPI unless specifically indicated in ROS section , no FCNAVD.    GEN: nad, alert and oriented HEENT: mucous membranes moist NECK: supple w/o LA CV: IRR.   PULM: ctab, no inc wob ABD: soft, +bs EXT: no edema

## 2016-09-03 NOTE — Progress Notes (Signed)
Pre visit review using our clinic review tool, if applicable. No additional management support is needed unless otherwise documented below in the visit note. 

## 2016-09-27 ENCOUNTER — Other Ambulatory Visit: Payer: Self-pay | Admitting: Family Medicine

## 2016-10-04 ENCOUNTER — Other Ambulatory Visit: Payer: Self-pay | Admitting: Family Medicine

## 2016-10-05 ENCOUNTER — Other Ambulatory Visit: Payer: Self-pay | Admitting: Cardiology

## 2016-10-05 DIAGNOSIS — I480 Paroxysmal atrial fibrillation: Secondary | ICD-10-CM

## 2016-10-13 ENCOUNTER — Encounter: Payer: Medicare Other | Admitting: Internal Medicine

## 2016-10-29 ENCOUNTER — Encounter: Payer: Self-pay | Admitting: Family

## 2016-10-30 ENCOUNTER — Encounter: Payer: Self-pay | Admitting: Internal Medicine

## 2016-10-30 ENCOUNTER — Ambulatory Visit (INDEPENDENT_AMBULATORY_CARE_PROVIDER_SITE_OTHER): Payer: Medicare Other | Admitting: Internal Medicine

## 2016-10-30 VITALS — BP 162/68 | HR 65 | Ht 68.0 in | Wt 203.2 lb

## 2016-10-30 DIAGNOSIS — I10 Essential (primary) hypertension: Secondary | ICD-10-CM

## 2016-10-30 DIAGNOSIS — Z950 Presence of cardiac pacemaker: Secondary | ICD-10-CM

## 2016-10-30 DIAGNOSIS — I442 Atrioventricular block, complete: Secondary | ICD-10-CM | POA: Diagnosis not present

## 2016-10-30 DIAGNOSIS — I5033 Acute on chronic diastolic (congestive) heart failure: Secondary | ICD-10-CM | POA: Diagnosis not present

## 2016-10-30 LAB — CUP PACEART INCLINIC DEVICE CHECK
Brady Statistic RA Percent Paced: 30 %
Brady Statistic RV Percent Paced: 99.8 %
Date Time Interrogation Session: 20180608132902
Implantable Lead Implant Date: 20160321
Implantable Lead Implant Date: 20160321
Implantable Lead Location: 753860
Lead Channel Impedance Value: 450 Ohm
Lead Channel Impedance Value: 475 Ohm
Lead Channel Pacing Threshold Amplitude: 0.75 V
Lead Channel Pacing Threshold Amplitude: 1.75 V
Lead Channel Pacing Threshold Pulse Width: 0.5 ms
Lead Channel Pacing Threshold Pulse Width: 0.8 ms
Lead Channel Sensing Intrinsic Amplitude: 2.5 mV
Lead Channel Setting Pacing Amplitude: 3 V
Lead Channel Setting Pacing Pulse Width: 0.5 ms
Lead Channel Setting Sensing Sensitivity: 8 mV
MDC IDC LEAD LOCATION: 753859
MDC IDC MSMT BATTERY REMAINING LONGEVITY: 86 mo
MDC IDC MSMT BATTERY VOLTAGE: 3.01 V
MDC IDC PG IMPLANT DT: 20160321
MDC IDC SET LEADCHNL RV PACING AMPLITUDE: 2.5 V
Pulse Gen Serial Number: 7734710

## 2016-10-30 NOTE — Progress Notes (Signed)
HPI Dean Murphy returns today for followup. He is a pleasant 83 yo man with a h/o diastolic heart failure, complete heart block, s/p PPM insertion. He remains active despite several medical problems and an advanced age. He denies chest pain or sob. He has had a problem with his prosthetic knee getting infected with Staph, s/p I & D. He denies pain in his knee and denies fever or chills. No syncope. No night sweats. His knee pain has resolved for the most part.   Allergies  Allergen Reactions  . Reclast [Zoledronic Acid] Other (See Comments)    Stopped 2015 due to Creatine      Current Outpatient Prescriptions  Medication Sig Dispense Refill  . AMITIZA 8 MCG capsule TAKE ONE CAPSULE BY MOUTH EVERY MORNING 90 capsule 1  . amLODipine (NORVASC) 2.5 MG tablet Take 1 tablet (2.5 mg total) by mouth daily. 90 tablet 3  . amoxicillin (AMOXIL) 500 MG capsule Take 1 capsule (500 mg total) by mouth 2 (two) times daily. 180 capsule 11  . clotrimazole-betamethasone (LOTRISONE) cream Apply 1 application topically 2 (two) times daily as needed (Use as directed).    [name] diltiazem (TIAZAC) 360 MG 24 hr capsule TAKE ONE CAPSULE BY MOUTH DAILY 90 capsule 3  . ELIQUIS 2.5 MG TABS tablet TAKE 1 TABLET BY MOUTH TWICE A DAY 180 tablet 3  . ferrous sulfate 325 (65 FE) MG tablet TAKE 1 TABLET BY MOUTH TWICE A DAY WITH A MEAL 180 tablet 1  . glipiZIDE (GLUCOTROL) 5 MG tablet TAKE TWO TABLETS BY MOUTH DAILY BEFORE BREAKFAST AND ONE TABLET BY MOUTH BEFORESUPPER 270 tablet 1  . JANUVIA 50 MG tablet TAKE 1 TABLET BY MOUTH DAILY 90 tablet 1  . omeprazole (PRILOSEC) 40 MG capsule Take 1 capsule (40 mg total) by mouth daily. 90 capsule 3  . Ophthalmic Irrigation Solution (EYE WASH) 99.05 % SOLN Place 1 drop into both eyes 2 (two) times daily.    . rosuvastatin (CRESTOR) 20 MG tablet Take 1 tablet (20 mg total) by mouth daily. 90 tablet 3   No current facility-administered medications for this visit.      Past  Medical History:  Diagnosis Date  . Anemia   . Arthritis   . CAD (coronary artery disease)    a. CABG 1989, b. Myoview low risk 2012.  . Carotid artery occlusion    a. Duplex 10/2014: patent R/L CEA with mild hyperplasia in right surgical bulb - followed by vascular.  . CHB (complete heart block) [facility]) March 2016   a. s/p STJ dual chamber pacemaker 07/2014.  [name] Chronic diastolic CHF (congestive heart failure) ([place])    a. Dx 12/4694 - acute diastolic CHF in the setting of CHB.  [name] Chronic kidney disease, stage IV (severe) (HCC)    stage III/IV as of 2015, [name]  . CKD (chronic kidney disease), stage IV ([place])   . Colon polyps   . Complication of anesthesia   . Constipation   . CVA (cerebral infarction)   . Diabetes mellitus    type II  . Diverticulosis   . Dupuytren's disease    finger right hand contracted  . Essential hypertension   . GERD (gastroesophageal reflux disease)   . Hemorrhoids   . Hyperlipidemia   . Myocardial infarction [facility]) 1976  and Mar. 19, 2016  . Orthostasis   . Osteoporosis   . PAF (paroxysmal atrial fibrillation) [facility]) March 2016  . PONV (postoperative nausea  and vomiting)   . Presence of permanent cardiac pacemaker   . Shortness of breath dyspnea    due to pain    ROS:   All systems reviewed and negative except as noted in the HPI.   Past Surgical History:  Procedure Laterality Date  . CARDIAC CATHETERIZATION  08/11/2014   Procedure: TEMPORARY PACEMAKER;  Surgeon: [name], MD;  Location: [facility] CATH LAB;  Service: Cardiovascular;;  . CAROTID ENDARTERECTOMY  12/31/10   Right  . CAROTID ENDARTERECTOMY  02/09/11   left  . CHOLECYSTECTOMY    . CORONARY ARTERY BYPASS GRAFT  1989  . EYE SURGERY Bilateral    Cataract with implants  . I&D KNEE WITH POLY EXCHANGE Left 07/29/2015   Procedure: IRRIGATION AND DEBRIDEMENT LEFT KNEE WITH POLY EXCHANGE;  Surgeon: Rod Can, MD;  Location: [place];  Service: Orthopedics;  Laterality: Left;  .  IR GENERIC HISTORICAL  03/04/2016   IR US GUIDE VASC ACCESS RIGHT 03/04/2016 Marybelle Killings, MD [facility]INTER[facility]  . IR GENERIC HISTORICAL  03/04/2016   IR FLUORO GUIDE CV LINE RIGHT 03/04/2016 Marybelle Killings, MD [facility]  . JOINT REPLACEMENT     bilat. knees  . PERMANENT PACEMAKER INSERTION N/A 08/13/2014   STJ dual chamber pacemaker implanted by [name] for CHB  . TONSILLECTOMY    . TOTAL KNEE ARTHROPLASTY     bilateral     Family History  Problem Relation Age of Onset  . Heart disease Mother        Before age 16  . Diabetes Mother   . Varicose Veins Mother   . Heart attack Mother   . Heart attack Father   . Heart disease Father        After age 67  . Hypertension Father   . Heart disease Brother        Before age 28  . Hypertension Brother   . Heart attack Brother   . Diabetes Son   . Hypertension Son   . Hypertension Son   . Diabetes Son   . Diabetes Unknown   . Cancer Unknown   . Colon cancer Neg Hx      Social History   Social History  . Marital status: Married    Spouse name: N/A  . Number of children: N/A  . Years of education: N/A   Occupational History  . retired Retired   Social History Main Topics  . Smoking status: Former Smoker    Packs/day: 1.00    Years: 25.00    Types: Cigarettes    Quit date: 01/21/1973  . Smokeless tobacco: Never Used     Comment: began smoking in high school, quit age 8  . Alcohol use 0.0 oz/week     Comment: 1-2 per week  . Drug use: No  . Sexual activity: No   Other Topics Concern  . 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  Remarried 1998.     3 sons     BP (!) 162/68   Pulse 65   Ht 5\' 8"  (1.727 m)   Wt 203 lb 3.2 oz (92.2 kg)   SpO2 96%   BMI 30.90 kg/m   Physical Exam:  elderly appearing 83 yo man, NAD HEENT: Unremarkable Neck:  7cm JVD, no thyromegally Lymphatics:  No adenopathy Back:  No CVA tenderness Lungs:   Clear with no  wheezes HEART:  Regular rate rhythm, no murmurs, no rubs, no clicks Abd:  soft, positive bowel sounds, no organomegally, no rebound, no guarding Ext:  2 plus pulses, no edema, no cyanosis, no clubbing, left knee is minimally swollen and warm Skin:  No rashes no nodules Neuro:  CN II through XII intact, motor grossly intact  DEVICE  Normal device function.  See PaceArt for details.   Assess/Plan: 1. Chronic diastolic heart failure - he is class 2. He will continue his current meds. 2. PPM - his [employer] DDD PM is working normally. Will recheck in several months. 3. PAF - he is mostly NSR. He will continue his Eliquis. He goes out of rhythm for upto a few seconds at a time. 4. Staph infection - he has been debrided and has no systemic symptoms. No evidence of involvement with his PPM. He will undergo watchful waiting  Dean Murphy.D.

## 2016-10-30 NOTE — Patient Instructions (Addendum)
Medication Instructions:  Your physician recommends that you continue on your current medications as directed. Please refer to the Current Medication list given to you today.   Labwork: None Ordered   Testing/Procedures: None Ordered   Follow-Up: Your physician wants you to follow-up in: 1 year with Dr. Taylor. You will receive a reminder letter in the mail two months in advance. If you don't receive a letter, please call our office to schedule the follow-up appointment.  Remote monitoring is used to monitor your Pacemaker from home. This monitoring reduces the number of office visits required to check your device to one time per year. It allows us to keep an eye on the functioning of your device to ensure it is working properly. You are scheduled for a device check from home on  02/01/17 . You may send your transmission at any time that day. If you have a wireless device, the transmission will be sent automatically. After your physician reviews your transmission, you will receive a postcard with your next transmission date.    Any Other Special Instructions Will Be Listed Below (If Applicable).     If you need a refill on your cardiac medications before your next appointment, please call your pharmacy.   

## 2016-11-03 ENCOUNTER — Other Ambulatory Visit: Payer: Self-pay

## 2016-11-03 DIAGNOSIS — I739 Peripheral vascular disease, unspecified: Secondary | ICD-10-CM

## 2016-11-03 DIAGNOSIS — I6523 Occlusion and stenosis of bilateral carotid arteries: Secondary | ICD-10-CM

## 2016-11-05 ENCOUNTER — Ambulatory Visit (HOSPITAL_COMMUNITY)
Admission: RE | Admit: 2016-11-05 | Discharge: 2016-11-05 | Disposition: A | Discharge status: Home / Self Care | Payer: Medicare Other | Source: Ambulatory Visit | Attending: Family | Admitting: Family

## 2016-11-05 ENCOUNTER — Encounter: Payer: Self-pay | Admitting: Family

## 2016-11-05 ENCOUNTER — Ambulatory Visit (INDEPENDENT_AMBULATORY_CARE_PROVIDER_SITE_OTHER)
Admission: RE | Admit: 2016-11-05 | Discharge: 2016-11-05 | Disposition: A | Discharge status: Home / Self Care | Payer: Medicare Other | Source: Ambulatory Visit | Attending: Family | Admitting: Family

## 2016-11-05 ENCOUNTER — Ambulatory Visit (INDEPENDENT_AMBULATORY_CARE_PROVIDER_SITE_OTHER): Payer: Medicare Other | Admitting: Family

## 2016-11-05 VITALS — BP 148/72 | HR 67 | Temp 97.1°F | Resp 20 | Ht 68.0 in | Wt 202.0 lb

## 2016-11-05 DIAGNOSIS — Z87891 Personal history of nicotine dependence: Secondary | ICD-10-CM | POA: Diagnosis not present

## 2016-11-05 DIAGNOSIS — E1151 Type 2 diabetes mellitus with diabetic peripheral angiopathy without gangrene: Secondary | ICD-10-CM | POA: Diagnosis not present

## 2016-11-05 DIAGNOSIS — R0989 Other specified symptoms and signs involving the circulatory and respiratory systems: Secondary | ICD-10-CM | POA: Diagnosis present

## 2016-11-05 DIAGNOSIS — I739 Peripheral vascular disease, unspecified: Secondary | ICD-10-CM

## 2016-11-05 DIAGNOSIS — I6523 Occlusion and stenosis of bilateral carotid arteries: Secondary | ICD-10-CM | POA: Insufficient documentation

## 2016-11-05 DIAGNOSIS — Z9889 Other specified postprocedural states: Secondary | ICD-10-CM | POA: Diagnosis not present

## 2016-11-05 NOTE — Progress Notes (Signed)
Chief Complaint: Follow up Extracranial Carotid Artery Stenosis   History of Present Illness  Dean Murphy is a 83 y.o. male patient of Dr. Oneida Alar who presents for follow-up s/p bilateral carotid endarterectomy in 2012. He is on Aspirin for antiplatelet therapy. His atherosclerotic risk factors remain diabetes, elevated cholesterol, hypertension and coronary artery disease. These are all currently stable and followed by his primary care physician. He denies any new neurologic events including amaurosis, numbness, or weakness.    He is physically active.  He does not seem to have claudication symptoms, denies non healing wounds. He does have a tired feeling in his low back and hips about noon every day, then takes a nap. He does yard work in the morning.   He had a staph infection in his left knee in March 2017, had I&D of this, was on IV antibx for several weeks, he continues po antibx  He had an MI March 2016, had pacemaker inserted at that time. He had one previous MI in 1976, 5 vessel CABG in 1989.   Pt reports tingling in the toes of both feet.  The patient reports history of TIA , amaurosis fugax, in his left eye that last about 5 minutes; this occurred before his first CEA. The patient denies a history of unilateral facial drooping, denies a history of hemiplegia, and denies a history of receptive or expressive aphasia.   Pt Diabetic: yes, A1C in 08-27-16 was 8.0, (review of records), in good control; his serum creatinine on that date was 1.72 Pt smoker: former smoker, quit about 1974  Pt meds include: Statin : yes ASA: no Other anticoagulants/antiplatelets: Elliquis since pacemaker insertion, stopped for a short period when he had GI bleeding   Past Medical History:  Diagnosis Date  . Anemia   . Arthritis   . CAD (coronary artery disease)    a. CABG 1989, b. Myoview low risk 2012.  . Carotid artery occlusion    a. Duplex 10/2014: patent R/L CEA with mild  hyperplasia in right surgical bulb - followed by vascular.  . CHB (complete heart block) [facility]) March 2016   a. s/p STJ dual chamber pacemaker 07/2014.  [name] Chronic diastolic CHF (congestive heart failure) ([place])    a. Dx 08/979 - acute diastolic CHF in the setting of CHB.  [name] Chronic kidney disease, stage IV (severe) (HCC)    stage III/IV as of 2015, [name]  . CKD (chronic kidney disease), stage IV ([place])   . Colon polyps   . Complication of anesthesia   . Constipation   . CVA (cerebral infarction)   . Diabetes mellitus    type II  . Diverticulosis   . Dupuytren's disease    finger right hand contracted  . Essential hypertension   . GERD (gastroesophageal reflux disease)   . Hemorrhoids   . Hyperlipidemia   . Myocardial infarction [facility]) 1976  and Mar. 19, 2016  . Orthostasis   . Osteoporosis   . PAF (paroxysmal atrial fibrillation) [facility]) March 2016  . PONV (postoperative nausea and vomiting)   . Presence of permanent cardiac pacemaker   . Shortness of breath dyspnea    due to pain    Social History Social History  Substance Use Topics  . Smoking status: Former Smoker    Packs/day: 1.00    Years: 25.00    Types: Cigarettes    Quit date: 01/21/1973  . Smokeless tobacco: Never Used     Comment: began smoking  in high school, quit age 63  . Alcohol use 0.0 oz/week     Comment: 1-2 per week    Family History Family History  Problem Relation Age of Onset  . Heart disease Mother        Before age 61  . Diabetes Mother   . Varicose Veins Mother   . Heart attack Mother   . Heart attack Father   . Heart disease Father        After age 66  . Hypertension Father   . Heart disease Brother        Before age 37  . Hypertension Brother   . Heart attack Brother   . Diabetes Son   . Hypertension Son   . Hypertension Son   . Diabetes Son   . Diabetes Unknown   . Cancer Unknown   . Colon cancer Neg Hx     Surgical History Past Surgical History:  Procedure  Laterality Date  . CARDIAC CATHETERIZATION  08/11/2014   Procedure: TEMPORARY PACEMAKER;  Surgeon: [name], MD;  Location: [facility] CATH LAB;  Service: Cardiovascular;;  . CAROTID ENDARTERECTOMY  12/31/10   Right  . CAROTID ENDARTERECTOMY  02/09/11   left  . CHOLECYSTECTOMY    . CORONARY ARTERY BYPASS GRAFT  1989  . EYE SURGERY Bilateral    Cataract with implants  . I&D KNEE WITH POLY EXCHANGE Left 07/29/2015   Procedure: IRRIGATION AND DEBRIDEMENT LEFT KNEE WITH POLY EXCHANGE;  Surgeon: Rod Can, MD;  Location: [facility];  Service: Orthopedics;  Laterality: Left;  . IR GENERIC HISTORICAL  03/04/2016   IR US GUIDE VASC ACCESS RIGHT 03/04/2016 Marybelle Killings, MD [facility]-INTERV RAD  . IR GENERIC HISTORICAL  03/04/2016   IR FLUORO GUIDE CV LINE RIGHT 03/04/2016 Marybelle Killings, MD [facility]INTER[facility]  . JOINT REPLACEMENT     bilat. knees  . PERMANENT PACEMAKER INSERTION N/A 08/13/2014   STJ dual chamber pacemaker implanted by [name] for CHB  . TONSILLECTOMY    . TOTAL KNEE ARTHROPLASTY     bilateral    Allergies  Allergen Reactions  . Reclast [Zoledronic Acid] Other (See Comments)    Stopped 2015 due to Creatine     Current Outpatient Prescriptions  Medication Sig Dispense Refill  . AMITIZA 8 MCG capsule TAKE ONE CAPSULE BY MOUTH EVERY MORNING 90 capsule 1  . amLODipine (NORVASC) 2.5 MG tablet Take 1 tablet (2.5 mg total) by mouth daily. 90 tablet 3  . amoxicillin (AMOXIL) 500 MG capsule Take 1 capsule (500 mg total) by mouth 2 (two) times daily. 180 capsule 11  . clotrimazole-betamethasone (LOTRISONE) cream Apply 1 application topically 2 (two) times daily as needed (Use as directed).    [name] diltiazem (TIAZAC) 360 MG 24 hr capsule TAKE ONE CAPSULE BY MOUTH DAILY 90 capsule 3  . ELIQUIS 2.5 MG TABS tablet TAKE 1 TABLET BY MOUTH TWICE A DAY 180 tablet 3  . ferrous sulfate 325 (65 FE) MG tablet TAKE 1 TABLET BY MOUTH TWICE A DAY WITH A MEAL 180 tablet 1  . glipiZIDE (GLUCOTROL) 5 MG tablet TAKE TWO  TABLETS BY MOUTH DAILY BEFORE BREAKFAST AND ONE TABLET BY MOUTH BEFORESUPPER 270 tablet 1  . JANUVIA 50 MG tablet TAKE 1 TABLET BY MOUTH DAILY 90 tablet 1  . omeprazole (PRILOSEC) 40 MG capsule Take 1 capsule (40 mg total) by mouth daily. 90 capsule 3  . Ophthalmic Irrigation Solution (EYE WASH) 99.05 % SOLN Place 1 drop into both  eyes 2 (two) times daily.    . rosuvastatin (CRESTOR) 20 MG tablet Take 1 tablet (20 mg total) by mouth daily. 90 tablet 3   No current facility-administered medications for this visit.     Review of Systems : See HPI for pertinent positives and negatives.  Physical Examination  Vitals:   11/05/16 1402 11/05/16 1404  BP: (!) 154/74 (!) 148/72  Pulse: 67   Resp: 20   Temp: 97.1 F (36.2 C)   TempSrc: Oral   SpO2: 95%   Weight: 202 lb (91.6 kg)   Height: 5\' 8"  (1.727 m)    Body mass index is 30.71 kg/m.  General: WDWN obese male in NAD GAIT: normal Eyes: PERRLA Pulmonary: Respirations are non-labored, CTAB Cardiac: Regular rhythm, no detected murmur. Subcutaneous pacemaker left upper chest.  VASCULAR EXAM Carotid Bruits Right Left   Positive Negative   Aorta is not palpable. Radial pulses are 2+ palpable and equal.      LE Pulses Right Left   FEMORAL 1+ palpable 2+ palpable    POPLITEAL not palpable  not palpable   POSTERIOR TIBIAL faintly palpable  1+ palpable    DORSALIS PEDIS  ANTERIOR TIBIAL faintly palpable  1+ palpable     Gastrointestinal: soft, nontender, BS WNL, no r/g, no palpated masses.  Musculoskeletal: No muscle atrophy/wasting. M/S 5/5 throughout, Extremities without ischemic changes.  Neurologic: A&O X 3; Appropriate Affect;  Speech is normal CN 2-12 intact, Pain and light touch intact in extremities, Motor exam as listed above      Assessment: TRAI ELLS is a 83 y.o. male who presents s/p staged bilateral carotid endarterectomies in 2012. He had a left amaurosis fugax TIA in 2012 before his fIrst CEA; he has had no subsequent TIA's or strokes.  He has diminished pedal pulses but no symptoms of claudication with walking.   DATA Carotid Duplex (11/05/16): Patent bilateral carotid endarterectomy sites with no evidence for restenosis, mild hyperplasia noted in the bilateral surgery sites. Bilateral vertebral artery flow is antegrade.  Bilateral subclavian artery waveforms are normal.  No significant change since exams on 10-25-2014 and 10-31-2015.   ABI (Date: 11/05/16):  R:   ABI: 1.07 (no previous for comparison),   PT: bi  DP: bi  TBI:  0.75  L:   ABI: 1.11 (no previous),   PT: bi  DP: bi  TBI: 0.72  No evidence of arterial occlusively disease bilaterally with normal ABI and TBI; however, all waveforms are biphasic.     Plan: Follow-up in 1 year with Carotid Duplex scan and ABI's.   I discussed in depth with the patient the nature of atherosclerosis, and emphasized the importance of maximal medical management including strict control of blood pressure, blood glucose, and lipid levels, obtaining regular exercise, and continued cessation of smoking.  The patient is aware that without maximal medical management the underlying atherosclerotic disease process will progress, limiting the benefit of any interventions. The patient was given information about stroke prevention and what symptoms should prompt the patient to seek immediate medical care. Thank you for allowing [place] to participate in this patient's care.  Dean Chambers, RN, MSN, FNP-C [facility] Office: [phone]  Clinic Physician: Donzetta Matters on call  11/05/16 2:11 PM

## 2016-11-05 NOTE — Patient Instructions (Signed)
Stroke Prevention Some medical conditions and behaviors are associated with an increased chance of having a stroke. You may prevent a stroke by making healthy choices and managing medical conditions. How can I reduce my risk of having a stroke?  Stay physically active. Get at least 30 minutes of activity on most or all days.  Do not smoke. It may also be helpful to avoid exposure to secondhand smoke.  Limit alcohol use. Moderate alcohol use is considered to be: ? No more than 2 drinks per day for men. ? No more than 1 drink per day for nonpregnant women.  Eat healthy foods. This involves: ? Eating 5 or more servings of fruits and vegetables a day. ? Making dietary changes that address high blood pressure (hypertension), high cholesterol, diabetes, or obesity.  Manage your cholesterol levels. ? Making food choices that are high in fiber and low in saturated fat, trans fat, and cholesterol may control cholesterol levels. ? Take any prescribed medicines to control cholesterol as directed by your health care provider.  Manage your diabetes. ? Controlling your carbohydrate and sugar intake is recommended to manage diabetes. ? Take any prescribed medicines to control diabetes as directed by your health care provider.  Control your hypertension. ? Making food choices that are low in salt (sodium), saturated fat, trans fat, and cholesterol is recommended to manage hypertension. ? Ask your health care provider if you need treatment to lower your blood pressure. Take any prescribed medicines to control hypertension as directed by your health care provider. ? If you are 18-39 years of age, have your blood pressure checked every 3-5 years. If you are 40 years of age or older, have your blood pressure checked every year.  Maintain a healthy weight. ? Reducing calorie intake and making food choices that are low in sodium, saturated fat, trans fat, and cholesterol are recommended to manage  weight.  Stop drug abuse.  Avoid taking birth control pills. ? Talk to your health care provider about the risks of taking birth control pills if you are over 35 years old, smoke, get migraines, or have ever had a blood clot.  Get evaluated for sleep disorders (sleep apnea). ? Talk to your health care provider about getting a sleep evaluation if you snore a lot or have excessive sleepiness.  Take medicines only as directed by your health care provider. ? For some people, aspirin or blood thinners (anticoagulants) are helpful in reducing the risk of forming abnormal blood clots that can lead to stroke. If you have the irregular heart rhythm of atrial fibrillation, you should be on a blood thinner unless there is a good reason you cannot take them. ? Understand all your medicine instructions.  Make sure that other conditions (such as anemia or atherosclerosis) are addressed. Get help right away if:  You have sudden weakness or numbness of the face, arm, or leg, especially on one side of the body.  Your face or eyelid droops to one side.  You have sudden confusion.  You have trouble speaking (aphasia) or understanding.  You have sudden trouble seeing in one or both eyes.  You have sudden trouble walking.  You have dizziness.  You have a loss of balance or coordination.  You have a sudden, severe headache with no known cause.  You have new chest pain or an irregular heartbeat. Any of these symptoms may represent a serious problem that is an emergency. Do not wait to see if the symptoms will go away.   Get medical help at once. Call your local emergency services (911 in [place] Do not drive yourself to the hospital. This information is not intended to replace advice given to you by your health care provider. Make sure you discuss any questions you have with your health care provider. Document Released: 06/18/2004 Document Revised: 10/17/2015 Document Reviewed: 11/11/2012 Elsevier  Interactive Patient Education  2017 Elsevier Inc.     Preventing Cerebrovascular Disease Arteries are blood vessels that carry blood that contains oxygen from the heart to all parts of the body. Cerebrovascular disease affects arteries that supply the brain. Any condition that blocks or disrupts blood flow to the brain can cause cerebrovascular disease. Brain cells that lose blood supply start to die within minutes (stroke). Stroke is the main danger of cerebrovascular disease. Atherosclerosis and high blood pressure are common causes of cerebrovascular disease. Atherosclerosis is narrowing and hardening of an artery that results when fat, cholesterol, calcium, or other substances (plaque) build up inside an artery. Plaque reduces blood flow through the artery. High blood pressure increases the risk of bleeding inside the brain. Making diet and lifestyle changes to prevent atherosclerosis and high blood pressure lowers your risk of cerebrovascular disease. What nutrition changes can be made?  Eat more fruits, vegetables, and whole grains.  Reduce how much saturated fat you eat. To do this, eat less red meat and fewer full-fat dairy products.  Eat healthy proteins instead of red meat. Healthy proteins include: ? Fish. Eat fish that contains heart-healthy omega-3 fatty acids, twice a week. Examples include salmon, albacore tuna, mackerel, and herring. ? Chicken. ? Nuts. ? Low-fat or nonfat yogurt.  Avoid processed meats, like bacon and lunchmeat.  Avoid foods that contain: ? A lot of sugar, such as sweets and drinks with added sugar. ? A lot of salt (sodium). Avoid adding extra salt to your food, as told by your health care provider. ? Trans fats, such as margarine and baked goods. Trans fats may be listed as "partially hydrogenated oils" on food labels.  Check food labels to see how much sodium, sugar, and trans fats are in foods.  Use vegetable oils that contain low amounts of  saturated fat, such as olive oil or canola oil. What lifestyle changes can be made?  Drink alcohol in moderation. This means no more than 1 drink a day for nonpregnant women and 2 drinks a day for men. One drink equals 12 oz of beer, 5 oz of wine, or 1 oz of hard liquor.  If you are overweight, ask your health care provider to recommend a weight-loss plan for you. Losing 5-10 lb (2.2-4.5 kg) can reduce your risk of diabetes, atherosclerosis, and high blood pressure.  Exercise for 30?60 minutes on most days, or as much as told by your health care provider. ? Do moderate-intensity exercise, such as brisk walking, bicycling, and water aerobics. Ask your health care provider which activities are safe for you.  Do not use any products that contain nicotine or tobacco, such as cigarettes and e-cigarettes. If you need help quitting, ask your health care provider. Why are these changes important? Making these changes lowers your risk of many diseases that can cause cerebrovascular disease and stroke. Stroke is a leading cause of death and disability. Making these changes also improves your overall health and quality of life. What can I do to lower my risk? The following factors make you more likely to develop cerebrovascular disease:  Being overweight.  Smoking.  Being physically inactive.    Eating a high-fat diet.  Having certain health conditions, such as: ? Diabetes. ? High blood pressure. ? Heart disease. ? Atherosclerosis. ? High cholesterol. ? Sickle cell disease.  Talk with your health care provider about your risk for cerebrovascular disease. Work with your health care provider to control diseases that you have that may contribute to cerebrovascular disease. Your health care provider may prescribe medicines to help prevent major causes of cerebrovascular disease. Where to find more information: Learn more about preventing cerebrovascular disease from:  National Heart, Lung, and  Blood Institute: www.nhlbi.nih.gov/health/health-topics/topics/stroke  Centers for Disease Control and Prevention: cdc.gov/stroke/about.htm  Summary  Cerebrovascular disease can lead to a stroke.  Atherosclerosis and high blood pressure are major causes of cerebrovascular disease.  Making diet and lifestyle changes can reduce your risk of cerebrovascular disease.  Work with your health care provider to get your risk factors under control to reduce your risk of cerebrovascular disease. This information is not intended to replace advice given to you by your health care provider. Make sure you discuss any questions you have with your health care provider. Document Released: 05/26/2015 Document Revised: 11/29/2015 Document Reviewed: 05/26/2015 Elsevier Interactive Patient Education  2018 Elsevier Inc.  

## 2016-11-12 NOTE — Addendum Note (Signed)
Addended by: Lianne Cure A on: 11/12/2016 10:00 AM   Modules accepted: Orders

## 2016-12-08 ENCOUNTER — Encounter: Payer: Self-pay | Admitting: Internal Medicine

## 2016-12-08 ENCOUNTER — Ambulatory Visit (INDEPENDENT_AMBULATORY_CARE_PROVIDER_SITE_OTHER): Payer: Medicare Other | Admitting: Internal Medicine

## 2016-12-08 DIAGNOSIS — T8454XD Infection and inflammatory reaction due to internal left knee prosthesis, subsequent encounter: Secondary | ICD-10-CM

## 2016-12-08 NOTE — Assessment & Plan Note (Signed)
He will continue on long-term suppressive amoxicillin therapy for his left prosthetic knee infection. He will follow-up with me in 6 months.

## 2016-12-08 NOTE — Progress Notes (Signed)
[facility] for Infectious Disease  Patient Active Problem List   Diagnosis Date Noted  . Infection of prosthetic left knee joint ([place]) 07/29/2015    Priority: High  . Coronary artery disease 07/29/2015  . Stroke ([place]) 12/24/2014  . Double vision 12/23/2014  . Pacemaker 11/16/2014  . Anemia 09/27/2014  . Gastrointestinal hemorrhage associated with peptic ulcer 09/07/2014  . Atrial fibrillation-CHADS VASC2 = 5 (age, HTN, DM, Vasc) 08/13/2014  . Chest pain 08/11/2014  . Complete heart block ([place]) 08/11/2014  . S/P CABG x 5 1989. Low risk Myoview 2012 08/11/2014  . Syncope 08/11/2014  . Cough 06/14/2014  . Left foot pain 02/23/2014  . Gouty arthropathy 08/09/2013  . Chronic radicular lumbar pain 12/15/2012  . PVD- s/p bilat CEA- CA dopplers OK Jan 2015 10/06/2012  . Insomnia 09/30/2011  . OTHER SPECIFIED SITES OF SPRAINS AND STRAINS 04/25/2010  . DIVERTICULITIS OF COLON 02/21/2010  . TEMPOROMANDIBULAR JOINT DISORDER 11/22/2009  . DM (diabetes mellitus), type 2 with renal complications ([place]) 38/46/6599  . Chronic kidney disease, stage III (moderate) 07/26/2008  . CHRONIC PROSTATITIS 07/26/2008  . ADVEF, DRUG/MEDICINAL/BIOLOGICAL SUBST NOS 03/16/2007  . Dyslipidemia 11/15/2006  . Essential hypertension 11/15/2006  . GERD 11/15/2006  . Osteoarthritis 11/15/2006    Patient's Medications  New Prescriptions   No medications on file  Previous Medications   AMITIZA 8 MCG CAPSULE    TAKE ONE CAPSULE BY MOUTH EVERY MORNING   AMLODIPINE (NORVASC) 2.5 MG TABLET    Take 1 tablet (2.5 mg total) by mouth daily.   AMOXICILLIN (AMOXIL) 500 MG CAPSULE    Take 1 capsule (500 mg total) by mouth 2 (two) times daily.   CLOTRIMAZOLE-BETAMETHASONE (LOTRISONE) CREAM    Apply 1 application topically 2 (two) times daily as needed (Use as directed).   DILTIAZEM (TIAZAC) 360 MG 24 HR CAPSULE    TAKE ONE CAPSULE BY MOUTH DAILY   ELIQUIS 2.5 MG TABS TABLET    TAKE 1 TABLET BY MOUTH TWICE  A DAY   FERROUS SULFATE 325 (65 FE) MG TABLET    TAKE 1 TABLET BY MOUTH TWICE A DAY WITH A MEAL   GLIPIZIDE (GLUCOTROL) 5 MG TABLET    TAKE TWO TABLETS BY MOUTH DAILY BEFORE BREAKFAST AND ONE TABLET BY MOUTH BEFORESUPPER   JANUVIA 50 MG TABLET    TAKE 1 TABLET BY MOUTH DAILY   OMEPRAZOLE (PRILOSEC) 40 MG CAPSULE    Take 1 capsule (40 mg total) by mouth daily.   OPHTHALMIC IRRIGATION SOLUTION (EYE WASH) 99.05 % SOLN    Place 1 drop into both eyes 2 (two) times daily.   ROSUVASTATIN (CRESTOR) 20 MG TABLET    Take 1 tablet (20 mg total) by mouth daily.  Modified Medications   No medications on file  Discontinued Medications   No medications on file    Subjective: Mr. Capobianco is in with his wife for his routine follow-up visit. He continues to take chronic amoxicillin for his relapsed enterococcal left prosthetic knee infection. He has had no problems tolerating amoxicillin. He still has occasional aching pain in his left knee, especially at night but it is no worse. He is not needing to take anything for pain.  Review of Systems: Review of Systems  Constitutional: Negative for fever.  Gastrointestinal: Negative for abdominal pain, diarrhea, nausea and vomiting.  Musculoskeletal: Positive for joint pain.    Past Medical History:  Diagnosis Date  . Anemia   .  Arthritis   . CAD (coronary artery disease)    a. CABG 1989, b. Myoview low risk 2012.  . Carotid artery occlusion    a. Duplex 10/2014: patent R/L CEA with mild hyperplasia in right surgical bulb - followed by vascular.  . CHB (complete heart block) [facility]) March 2016   a. s/p STJ dual chamber pacemaker 07/2014.  [name] Chronic diastolic CHF (congestive heart failure) ([place])    a. Dx 05/930 - acute diastolic CHF in the setting of CHB.  [name] Chronic kidney disease, stage IV (severe) (HCC)    stage III/IV as of 2015, [name]  . CKD (chronic kidney disease), stage IV ([place])   . Colon polyps   . Complication of anesthesia   .  Constipation   . CVA (cerebral infarction)   . Diabetes mellitus    type II  . Diverticulosis   . Dupuytren's disease    finger right hand contracted  . Essential hypertension   . GERD (gastroesophageal reflux disease)   . Hemorrhoids   . Hyperlipidemia   . Myocardial infarction [facility]) 1976  and Mar. 19, 2016  . Orthostasis   . Osteoporosis   . PAF (paroxysmal atrial fibrillation) [facility]) March 2016  . PONV (postoperative nausea and vomiting)   . Presence of permanent cardiac pacemaker   . Shortness of breath dyspnea    due to pain    Social History  Substance Use Topics  . Smoking status: Former Smoker    Packs/day: 1.00    Years: 25.00    Types: Cigarettes    Quit date: 01/21/1973  . Smokeless tobacco: Never Used     Comment: began smoking in high school, quit age 25  . Alcohol use 0.0 oz/week     Comment: 1-2 per week    Family History  Problem Relation Age of Onset  . Heart disease Mother        Before age 34  . Diabetes Mother   . Varicose Veins Mother   . Heart attack Mother   . Heart attack Father   . Heart disease Father        After age 63  . Hypertension Father   . Heart disease Brother        Before age 66  . Hypertension Brother   . Heart attack Brother   . Diabetes Son   . Hypertension Son   . Hypertension Son   . Diabetes Son   . Diabetes Unknown   . Cancer Unknown   . Colon cancer Neg Hx     Allergies  Allergen Reactions  . Reclast [Zoledronic Acid] Other (See Comments)    Stopped 2015 due to Creatine     Objective: Vitals:   12/08/16 1011  BP: (!) 164/65  Pulse: 64  Temp: (!) 97.4 F (36.3 C)  TempSrc: Oral  Weight: 198 lb (89.8 kg)  Height: 5\' 9"  (1.753 m)   Body mass index is 29.24 kg/m.  Physical Exam  Constitutional:  He is in good spirits as usual.  Musculoskeletal:  The incision over his left knee is fully healed. He has no swelling, warmth or redness. He has good range of motion.    Lab Results    Problem List  Items Addressed This Visit      High   Infection of prosthetic left knee joint ([place])    He will continue on long-term suppressive amoxicillin therapy for his left prosthetic knee infection. He will follow-up with me in  6 months.          Michel Bickers, MD [facility] [place] [facility] [phone] pager   [phone] cell 12/08/2016, 10:36 AM

## 2016-12-29 ENCOUNTER — Other Ambulatory Visit (INDEPENDENT_AMBULATORY_CARE_PROVIDER_SITE_OTHER): Payer: Medicare Other

## 2016-12-29 DIAGNOSIS — D649 Anemia, unspecified: Secondary | ICD-10-CM | POA: Diagnosis not present

## 2016-12-29 DIAGNOSIS — E119 Type 2 diabetes mellitus without complications: Secondary | ICD-10-CM | POA: Diagnosis not present

## 2016-12-29 LAB — CBC WITH DIFFERENTIAL/PLATELET
BASOS ABS: 0.1 10*3/uL (ref 0.0–0.1)
Basophils Relative: 1 % (ref 0.0–3.0)
EOS ABS: 0.2 10*3/uL (ref 0.0–0.7)
Eosinophils Relative: 2.6 % (ref 0.0–5.0)
HCT: 44.6 % (ref 39.0–52.0)
Hemoglobin: 15.1 g/dL (ref 13.0–17.0)
LYMPHS ABS: 2.3 10*3/uL (ref 0.7–4.0)
Lymphocytes Relative: 29 % (ref 12.0–46.0)
MCHC: 34 g/dL (ref 30.0–36.0)
MCV: 92.2 fl (ref 78.0–100.0)
MONO ABS: 0.5 10*3/uL (ref 0.1–1.0)
Monocytes Relative: 6.7 % (ref 3.0–12.0)
NEUTROS ABS: 4.7 10*3/uL (ref 1.4–7.7)
NEUTROS PCT: 60.7 % (ref 43.0–77.0)
PLATELETS: 154 10*3/uL (ref 150.0–400.0)
RBC: 4.84 Mil/uL (ref 4.22–5.81)
RDW: 13 % (ref 11.5–15.5)
WBC: 7.8 10*3/uL (ref 4.0–10.5)

## 2016-12-29 LAB — LIPID PANEL
CHOL/HDL RATIO: 3
Cholesterol: 111 mg/dL (ref 0–200)
HDL: 38.4 mg/dL — AB (ref >39.00)
LDL CALC: 53 mg/dL (ref 0–99)
NONHDL: 72.71
TRIGLYCERIDES: 98 mg/dL (ref 0.0–149.0)
VLDL: 19.6 mg/dL (ref 0.0–40.0)

## 2016-12-29 LAB — COMPREHENSIVE METABOLIC PANEL
ALT: 18 U/L (ref 0–53)
AST: 17 U/L (ref 0–37)
Albumin: 4.2 g/dL (ref 3.5–5.2)
Alkaline Phosphatase: 72 U/L (ref 39–117)
BUN: 29 mg/dL — ABNORMAL HIGH (ref 6–23)
CALCIUM: 9.6 mg/dL (ref 8.4–10.5)
CHLORIDE: 107 meq/L (ref 96–112)
CO2: 28 meq/L (ref 19–32)
Creatinine, Ser: 2.02 mg/dL — ABNORMAL HIGH (ref 0.40–1.50)
GFR: 33.32 mL/min — AB (ref >60.00)
GLUCOSE: 157 mg/dL — AB (ref 70–99)
Potassium: 4.9 mEq/L (ref 3.5–5.1)
Sodium: 141 mEq/L (ref 135–145)
Total Bilirubin: 0.5 mg/dL (ref 0.2–1.2)
Total Protein: 6.7 g/dL (ref 6.0–8.3)

## 2016-12-29 LAB — IBC PANEL
IRON: 82 ug/dL (ref 42–165)
SATURATION RATIOS: 23.6 % (ref 20.0–50.0)
TRANSFERRIN: 248 mg/dL (ref 212.0–360.0)

## 2016-12-29 LAB — HEMOGLOBIN A1C: Hgb A1c MFr Bld: 8 % — ABNORMAL HIGH (ref 4.6–6.5)

## 2017-01-01 ENCOUNTER — Ambulatory Visit (INDEPENDENT_AMBULATORY_CARE_PROVIDER_SITE_OTHER): Payer: Medicare Other | Admitting: Family Medicine

## 2017-01-01 ENCOUNTER — Encounter: Payer: Self-pay | Admitting: Family Medicine

## 2017-01-01 VITALS — BP 150/68 | HR 60 | Temp 97.6°F | Wt 201.0 lb

## 2017-01-01 DIAGNOSIS — N183 Chronic kidney disease, stage 3 unspecified: Secondary | ICD-10-CM

## 2017-01-01 DIAGNOSIS — D509 Iron deficiency anemia, unspecified: Secondary | ICD-10-CM | POA: Diagnosis not present

## 2017-01-01 DIAGNOSIS — T8454XD Infection and inflammatory reaction due to internal left knee prosthesis, subsequent encounter: Secondary | ICD-10-CM | POA: Diagnosis not present

## 2017-01-01 DIAGNOSIS — E1122 Type 2 diabetes mellitus with diabetic chronic kidney disease: Secondary | ICD-10-CM | POA: Diagnosis not present

## 2017-01-01 DIAGNOSIS — E785 Hyperlipidemia, unspecified: Secondary | ICD-10-CM | POA: Diagnosis not present

## 2017-01-01 MED ORDER — INSULIN GLARGINE 100 UNIT/ML SOLOSTAR PEN: 5.0000 [IU] | PEN_INJECTOR | Freq: Every day | SUBCUTANEOUS | Status: DC

## 2017-01-01 NOTE — Assessment & Plan Note (Addendum)
Labs d/w pt.  change insulin to daily. Start with 5 units per day. See after visit summary. Recheck in several months. Update me in the meantime as needed.

## 2017-01-01 NOTE — Progress Notes (Signed)
Diabetes:  Using medications without difficulties:yes Hypoglycemic episodes:no Hyperglycemic episodes:no Feet problems: some tingling in the feet but better then prev.   Blood Sugars averaging: 160-180 usually.  eye exam within last year: yes Has been giving insulin only if sugar >150, ie a few times a week, with 20 units per dose.   Elevated Cholesterol: Using medications without problems:yes Muscle aches: no Diet compliance:encouraged Exercise:encouraged at tolerated.   Labs dw pt. Lipids at goal.    CKD, Cr elevation, with ARB added by renal clinic. BP is improved compared to prev highs, d/w pt.    Knee pain worse with prolonged sitting and at night.  Still on abx.  No fevers.  Less pain walking. Able to tolerate abx.    Iron def anemia.  On iron, level improved.  D/w pt. Black stools from iron.  No BRBPR.   PMH and SH reviewed  Meds, vitals, and allergies reviewed.   ROS: Per HPI unless specifically indicated in ROS section   GEN: nad, alert and oriented HEENT: mucous membranes moist NECK: supple w/o LA CV: rrr. PULM: ctab, no inc wob ABD: soft, +bs EXT: no edema SKIN: no acute rash  Diabetic foot exam: Normal inspection No skin breakdown B small calluses noted on plantar side Dec but intact B DP pulses- compared to radial pulse Normal sensation to light touch and monofilament Nails normal

## 2017-01-01 NOTE — Patient Instructions (Addendum)
Change to 5 units of insulin a day and update me in about 2 weeks.  Recheck labs in about 3-4 months, prior to a visit.  Take care.  Glad to see you.  Update me as needed.

## 2017-01-03 ENCOUNTER — Encounter: Payer: Self-pay | Admitting: Family Medicine

## 2017-01-03 NOTE — Assessment & Plan Note (Signed)
No fevers. Able to bear weight. Able tolerate his current situation. Continue as is with antibiotics. Appreciate help from infectious disease clinic.

## 2017-01-03 NOTE — Assessment & Plan Note (Signed)
Able tolerate statin. Continue as is. He agrees. Labs discussed with patient.

## 2017-01-03 NOTE — Assessment & Plan Note (Signed)
With ARB added by renal clinic. Continue as is. We will route recent labs to the renal clinic as FYI. Appreciate help of all involved.

## 2017-01-03 NOTE — Assessment & Plan Note (Signed)
We talked about options. It is likely safer to replete his iron stores by mouth and continue episodically checking his labs instead of pursuing an aggressive workup given his age and other medical issues. He agrees with this. Continue iron for now. Update me as needed.

## 2017-01-09 ENCOUNTER — Other Ambulatory Visit: Payer: Self-pay | Admitting: Family Medicine

## 2017-02-01 ENCOUNTER — Telehealth: Payer: Self-pay | Admitting: Internal Medicine

## 2017-02-01 ENCOUNTER — Ambulatory Visit (INDEPENDENT_AMBULATORY_CARE_PROVIDER_SITE_OTHER): Payer: Medicare Other | Admitting: *Deleted

## 2017-02-01 DIAGNOSIS — I442 Atrioventricular block, complete: Secondary | ICD-10-CM | POA: Diagnosis not present

## 2017-02-01 NOTE — Telephone Encounter (Signed)
Transmission received. Mr. Pierre aware and appreciative.

## 2017-02-01 NOTE — Telephone Encounter (Signed)
New message   Pt is calling to find out if transmission was received. Please call.

## 2017-02-01 NOTE — Progress Notes (Signed)
Remote pacemaker transmission.   

## 2017-02-03 ENCOUNTER — Encounter: Payer: Self-pay | Admitting: Cardiology

## 2017-02-04 LAB — CUP PACEART REMOTE DEVICE CHECK
Implantable Lead Location: 753859
Implantable Pulse Generator Implant Date: 20160321
MDC IDC LEAD IMPLANT DT: 20160321
MDC IDC LEAD IMPLANT DT: 20160321
MDC IDC LEAD LOCATION: 753860
MDC IDC SESS DTM: 20180913103947
Pulse Gen Serial Number: 7734710

## 2017-02-05 ENCOUNTER — Other Ambulatory Visit: Payer: Self-pay | Admitting: Cardiology

## 2017-02-05 DIAGNOSIS — I10 Essential (primary) hypertension: Secondary | ICD-10-CM

## 2017-02-05 DIAGNOSIS — E785 Hyperlipidemia, unspecified: Secondary | ICD-10-CM

## 2017-02-07 ENCOUNTER — Other Ambulatory Visit: Payer: Self-pay | Admitting: Family Medicine

## 2017-02-08 ENCOUNTER — Other Ambulatory Visit: Payer: Self-pay | Admitting: Family Medicine

## 2017-02-09 ENCOUNTER — Telehealth: Payer: Self-pay

## 2017-02-09 NOTE — Telephone Encounter (Signed)
Amy with [facility] left v/m; sent over PA for lantus on 02/08/17;Lantus is not covered by ins; basoglar and levemir are covered. Are we going to do PA for lantus or change to different med that is covered.Please advise.

## 2017-02-10 MED ORDER — BASAGLAR KWIKPEN 100 UNIT/ML ~~LOC~~ SOPN: 5.0000 [IU] | PEN_INJECTOR | Freq: Every day | SUBCUTANEOUS | 12 refills | Status: DC

## 2017-02-10 NOTE — Telephone Encounter (Signed)
Spoke to Walden at [facility] and was advised that they got the new script and have notified the patient.

## 2017-02-10 NOTE — Telephone Encounter (Signed)
Change to basaglar.  No change in sig.  Continue as is with same number of units.

## 2017-02-17 ENCOUNTER — Encounter: Payer: Self-pay | Admitting: Cardiology

## 2017-02-25 ENCOUNTER — Encounter: Payer: Self-pay | Admitting: Cardiology

## 2017-02-25 ENCOUNTER — Ambulatory Visit (INDEPENDENT_AMBULATORY_CARE_PROVIDER_SITE_OTHER): Payer: Medicare Other | Admitting: Cardiology

## 2017-02-25 VITALS — BP 140/72 | HR 76 | Ht 69.0 in | Wt 201.0 lb

## 2017-02-25 DIAGNOSIS — I442 Atrioventricular block, complete: Secondary | ICD-10-CM | POA: Diagnosis not present

## 2017-02-25 DIAGNOSIS — E782 Mixed hyperlipidemia: Secondary | ICD-10-CM | POA: Diagnosis not present

## 2017-02-25 DIAGNOSIS — I2510 Atherosclerotic heart disease of native coronary artery without angina pectoris: Secondary | ICD-10-CM | POA: Diagnosis not present

## 2017-02-25 DIAGNOSIS — I10 Essential (primary) hypertension: Secondary | ICD-10-CM | POA: Diagnosis not present

## 2017-02-25 NOTE — Progress Notes (Signed)
Patient ID: Dean Murphy, male   DOB: 07-14-1926, 83 y.o.   MRN: [id number]     Cardiology Office Note Date:  02/25/2017  Patient ID:  Dean Murphy, Dean Murphy 1926-09-24, MRN [id number] PCP:  Tonia Ghent, MD  Cardiologist: Dr. Meda Coffee  Chief Complaint, reason for visit: 6 months follow up  History of Present Illness: Dean Murphy is a 83 y.o. male with history of CAD s/p CABG 1989, CKD stage IV, HTN with history of orthostasis, DM, anemia, CHB 11/8293 (complicated by acute diastolic CHF) s/p PPM, CVA, carotid disease s/p CEAs (followed by [facility]), hyperlipidemia, PAF who presents for f/u exertional fatigue and low BP. He was seen by Melina Copa in 02/2015 for decreased stamina and tendency toward lower blood pressures. Dr. Damita Dunnings cut his amlodipine in half to 2.5mg  daily. Since that time the patient says he feels "100% better." Even his sons have noticed that he has had increased stamina. Dyspnea has improved tremendously and he no longer feels lethargic. He has not had any chest pain or pressure. He follows his BP at home and this AM got a reading of 130/75.  07/06/2016 - the patient is coming after 6 months, cardio-wise he feels great he denies any chest pain shortness of breath she has minimal left lower extremity edema, he is no palpitations or falls. 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 He is planning crus to [facility] next week. His pacemaker was checked last 3 functioning well. No bleeding with Eliquis  Last echo 07/2014 showed mild LVH, EF 62-13%, normal diastolic function, mild MR.  02/25/2017 - 6 months follow up, the patient came back from river cruise on [place] and enjoyed it very much. He denies any chest pain, SOB, his LE edema has resolved, no palpitations, claudications, no dizziness or falls.  He has been compliant  with his medications, he denies any bleeding with anticoagulation and no muscle pain with Crestor. No falls.   Past Medical History:  Diagnosis Date  . Anemia   . Arthritis   . CAD (coronary artery disease)    a. CABG 1989, b. Myoview low risk 2012.  . Carotid artery occlusion    a. Duplex 10/2014: patent R/L CEA with mild hyperplasia in right surgical bulb - followed by vascular.  . CHB (complete heart block) [facility]) March 2016   a. s/p STJ dual chamber pacemaker 07/2014.  [name] Chronic diastolic CHF (congestive heart failure) ([place])    a. Dx 0/8657 - acute diastolic CHF in the setting of CHB.  [name] Chronic kidney disease, stage IV (severe) (HCC)    stage III/IV as of 2015, [name]  . CKD (chronic kidney disease), stage IV ([place])   . Colon polyps   . Complication of anesthesia   . Constipation   . CVA (cerebral infarction)   . Diabetes mellitus    type II  . Diverticulosis   . Dupuytren's disease    finger right hand contracted  . Essential hypertension   . GERD (gastroesophageal reflux disease)   . Hemorrhoids   . Hyperlipidemia   . Myocardial infarction [facility]) 1976  and Mar. 19, 2016  . Orthostasis   . Osteoporosis   . PAF (paroxysmal atrial fibrillation) [facility]) March 2016  . PONV (postoperative nausea and vomiting)   . Presence of permanent cardiac pacemaker   .  Shortness of breath dyspnea    due to pain    Past Surgical History:  Procedure Laterality Date  . CARDIAC CATHETERIZATION  08/11/2014   Procedure: TEMPORARY PACEMAKER;  Surgeon: [name], MD;  Location[facility] C[facility] LAB;  Service: Cardiovascular;;  . CAROTID ENDARTERECTOMY  12/31/10   Right  . CAROTID ENDARTERECTOMY  02/09/11   left  . CHOLECYSTECTOMY    . CORONARY ARTERY BYPASS GRAFT  1989  . EYE SURGERY Bilateral    Cataract with implants  . I&D KNEE WITH POLY EXCHANGE Left 07/29/2015   Procedure: IRRIGATION AND DEBRIDEMENT LEFT KNEE WITH POLY EXCHANGE;  Surgeon: Rod Can, MD;  Location: [facility];   Service: Orthopedics;  Laterality: Left;  . IR GENERIC HISTORICAL  03/04/2016   IR US GUIDE VASC ACCESS RIGHT 03/04/2016 Marybelle Killings, MD [facility]-INTERV RAD  . IR GENERIC HISTORICAL  03/04/2016   IR FLUORO GUIDE CV LINE RIGHT 03/04/2016 Marybelle Killings, MD [facility]-INTERV RAD  . JOINT REPLACEMENT     bilat. knees  . PERMANENT PACEMAKER INSERTION N/A 08/13/2014   STJ dual chamber pacemaker implanted by [name] for CHB  . TONSILLECTOMY    . TOTAL KNEE ARTHROPLASTY     bilateral    Current Outpatient Prescriptions  Medication Sig Dispense Refill  . AMITIZA 8 MCG capsule TAKE ONE CAPSULE BY MOUTH EVERY MORNING 90 capsule 1  . amLODipine (NORVASC) 2.5 MG tablet Take 1 tablet (2.5 mg total) by mouth daily. 90 tablet 3  . amoxicillin (AMOXIL) 500 MG capsule Take 1 capsule (500 mg total) by mouth 2 (two) times daily. 180 capsule 11  . clotrimazole-betamethasone (LOTRISONE) cream Apply 1 application topically 2 (two) times daily as needed (Use as directed).    [name] diltiazem (TIAZAC) 360 MG 24 hr capsule TAKE ONE CAPSULE BY MOUTH DAILY 90 capsule 3  . ELIQUIS 2.5 MG TABS tablet TAKE 1 TABLET BY MOUTH TWICE A DAY 180 tablet 3  . ferrous sulfate 325 (65 FE) MG tablet TAKE 1 TABLET BY MOUTH TWICE A DAY WITH A MEAL 180 tablet 1  . glipiZIDE (GLUCOTROL) 5 MG tablet TAKE 2 TABLETS BY MOUTH DAILY BEFORE BREAKFAST AND 1 TABLET BEFORE SUPPER 270 tablet 1  . Insulin Glargine (BASAGLAR KWIKPEN) 100 UNIT/ML SOPN Inject 0.05 mLs (5 Units total) into the skin at bedtime. 5 pen 12  . JANUVIA 50 MG tablet TAKE 1 TABLET BY MOUTH DAILY 90 tablet 1  . LEADER UNIFINE PENTIPS 31G X 5 MM MISC USE DAILY WITH INSULIN PEN 100 each 3  . losartan (COZAAR) 50 MG tablet Take 50 mg by mouth daily.    [name] omeprazole (PRILOSEC) 40 MG capsule Take 1 capsule (40 mg total) by mouth daily. 90 capsule 3  . Ophthalmic Irrigation Solution (EYE WASH) 99.05 % SOLN Place 1 drop into both eyes 2 (two) times daily.    . rosuvastatin (CRESTOR) 20 MG tablet  Take 1 tablet (20 mg total) by mouth daily. 90 tablet 2   No current facility-administered medications for this visit.     Allergies:   Reclast [zoledronic acid]   Social History:  The patient  reports that he quit smoking about 44 years ago. His smoking use included Cigarettes. He has a 25.00 pack-year smoking history. He has never used smokeless tobacco. He reports that he drinks alcohol. He reports that he does not use drugs.   Family History:  The patient's family history includes Cancer in his unknown relative; Diabetes in his mother, son, son,  and unknown relative; Heart attack in his brother, father, and mother; Heart disease in his brother, father, and mother; Hypertension in his brother, father, son, and son; Varicose Veins in his mother.  ROS:  Please see the history of present illness.   All other systems are reviewed and otherwise negative.   PHYSICAL EXAM:  VS:  BP 140/72   Pulse 76   Ht 5\' 9"  (1.753 m)   Wt 201 lb (91.2 kg)   BMI 29.68 kg/m  BMI: Body mass index is 29.68 kg/m. Well nourished, well developed WM, in no acute distress - lively, acts younger than stated age  4: normocephalic, atraumatic  Neck: no JVD, +right carotid bruit, no masses Cardiac:  normal S1, S2; RRR; no murmurs, rubs, or gallops Lungs:  clear to auscultation bilaterally, no wheezing, rhonchi or rales  Abd: soft, nontender, no hepatomegaly, + BS MS: no deformity or atrophy Ext: no edema  Skin: warm and dry, no rash Neuro:  moves all extremities spontaneously, no focal abnormalities noted, follows commands Psych: euthymic mood, full affect   EKG:  A sensed V paced rhythm with ventricular rate 76 bpm.  Recent Labs: 12/29/2016: ALT 18; BUN 29; Creatinine, Ser 2.02; Hemoglobin 15.1; Platelets 154.0; Potassium 4.9; Sodium 141  12/29/2016: Cholesterol 111; HDL 38.40; LDL Cholesterol 53; Total CHOL/HDL Ratio 3; Triglycerides 98.0; VLDL 19.6   CrCl cannot be calculated (Patient's most recent lab  result is older than the maximum 21 days allowed.).   Wt Readings from Last 3 Encounters:  02/25/17 201 lb (91.2 kg)  01/01/17 201 lb (91.2 kg)  12/08/16 198 lb (89.8 kg)    EKG 07/06/2016, normal sinus rhythm, the paced rhythm, unchanged from prior.  Other studies reviewed: Additional studies/records reviewed today include: summarized above    ASSESSMENT AND PLAN:  1. Lower extremity edema and dizziness - resolved after discontinuation of amlodipine, he is feeling great and doesn't need to use any diuretics.  2. CAD - patient is asymptomatic and active.  3. Hypertension -controlled on current regimen. 4. Paroxysmal atrial fibrillation - maintaining NSR. Continue Eliquis. No bleeding, the latest hemoglobin 15. 5. Chronic diastolic CHF - appears euvolemic.   6. Carotid disease - s/p B/L endarterectomy in 2012, stable carotid US in 10/2014, followed by Dr Oneida Alar. 7. Status post pacemaker placement for complete heart block, followed by Dr. Lovena Le.  Follow up in 6 months.  Signed, Ena Dawley, MD  02/25/2017 10:45 AM     [facility] [address] [place] [address] [phone] (office)  [phone] (fax)

## 2017-02-25 NOTE — Patient Instructions (Signed)
Medication Instructions:  Your physician recommends that you continue on your current medications as directed. Please refer to the Current Medication list given to you today.   Labwork: NONE ORDERED TODAY  Testing/Procedures: NONE ORDERED TODAY  Follow-Up: Your physician wants you to follow-up in: 6 MONTHS WITH DR. NELSON You will receive a reminder letter in the mail two months in advance. If you don't receive a letter, please call our office to schedule the follow-up appointment.   Any Other Special Instructions Will Be Listed Below (If Applicable).     If you need a refill on your cardiac medications before your next appointment, please call your pharmacy.   

## 2017-03-19 ENCOUNTER — Encounter: Payer: Self-pay | Admitting: Family Medicine

## 2017-04-22 ENCOUNTER — Other Ambulatory Visit: Payer: Self-pay | Admitting: Family Medicine

## 2017-04-23 ENCOUNTER — Other Ambulatory Visit: Payer: Self-pay | Admitting: Internal Medicine

## 2017-04-23 DIAGNOSIS — T8454XD Infection and inflammatory reaction due to internal left knee prosthesis, subsequent encounter: Secondary | ICD-10-CM

## 2017-04-30 ENCOUNTER — Other Ambulatory Visit (INDEPENDENT_AMBULATORY_CARE_PROVIDER_SITE_OTHER): Payer: Medicare Other

## 2017-04-30 DIAGNOSIS — E1122 Type 2 diabetes mellitus with diabetic chronic kidney disease: Secondary | ICD-10-CM

## 2017-04-30 DIAGNOSIS — D509 Iron deficiency anemia, unspecified: Secondary | ICD-10-CM | POA: Diagnosis not present

## 2017-04-30 LAB — BASIC METABOLIC PANEL
BUN: 19 mg/dL (ref 6–23)
CHLORIDE: 109 meq/L (ref 96–112)
CO2: 28 mEq/L (ref 19–32)
Calcium: 9.2 mg/dL (ref 8.4–10.5)
Creatinine, Ser: 1.6 mg/dL — ABNORMAL HIGH (ref 0.40–1.50)
GFR: 43.57 mL/min — AB (ref >60.00)
Glucose, Bld: 175 mg/dL — ABNORMAL HIGH (ref 70–99)
Potassium: 4.9 mEq/L (ref 3.5–5.1)
SODIUM: 143 meq/L (ref 135–145)

## 2017-04-30 LAB — CBC WITH DIFFERENTIAL/PLATELET
Basophils Absolute: 0.1 10*3/uL (ref 0.0–0.1)
Basophils Relative: 1 % (ref 0.0–3.0)
EOS PCT: 2.8 % (ref 0.0–5.0)
Eosinophils Absolute: 0.2 10*3/uL (ref 0.0–0.7)
HEMATOCRIT: 44.8 % (ref 39.0–52.0)
HEMOGLOBIN: 15.2 g/dL (ref 13.0–17.0)
LYMPHS PCT: 30.8 % (ref 12.0–46.0)
Lymphs Abs: 2.4 10*3/uL (ref 0.7–4.0)
MCHC: 33.9 g/dL (ref 30.0–36.0)
MCV: 92.1 fl (ref 78.0–100.0)
MONOS PCT: 6 % (ref 3.0–12.0)
Monocytes Absolute: 0.5 10*3/uL (ref 0.1–1.0)
Neutro Abs: 4.7 10*3/uL (ref 1.4–7.7)
Neutrophils Relative %: 59.4 % (ref 43.0–77.0)
Platelets: 148 10*3/uL — ABNORMAL LOW (ref 150.0–400.0)
RBC: 4.86 Mil/uL (ref 4.22–5.81)
RDW: 13 % (ref 11.5–15.5)
WBC: 7.9 10*3/uL (ref 4.0–10.5)

## 2017-04-30 LAB — IBC PANEL
IRON: 88 ug/dL (ref 42–165)
Saturation Ratios: 25.1 % (ref 20.0–50.0)
TRANSFERRIN: 250 mg/dL (ref 212.0–360.0)

## 2017-04-30 LAB — HEMOGLOBIN A1C: HEMOGLOBIN A1C: 8.3 % — AB (ref 4.6–6.5)

## 2017-05-03 ENCOUNTER — Ambulatory Visit (INDEPENDENT_AMBULATORY_CARE_PROVIDER_SITE_OTHER): Payer: Medicare Other | Admitting: *Deleted

## 2017-05-03 DIAGNOSIS — I442 Atrioventricular block, complete: Secondary | ICD-10-CM

## 2017-05-03 NOTE — Progress Notes (Signed)
Remote pacemaker transmission.   

## 2017-05-04 ENCOUNTER — Ambulatory Visit: Payer: Medicare Other | Admitting: Family Medicine

## 2017-05-07 ENCOUNTER — Encounter: Payer: Self-pay | Admitting: Cardiology

## 2017-05-07 ENCOUNTER — Ambulatory Visit: Payer: Medicare Other | Admitting: Family Medicine

## 2017-05-09 ENCOUNTER — Other Ambulatory Visit: Payer: Self-pay | Admitting: Family Medicine

## 2017-05-11 ENCOUNTER — Ambulatory Visit: Payer: Medicare Other | Admitting: Family Medicine

## 2017-05-11 ENCOUNTER — Encounter: Payer: Self-pay | Admitting: Family Medicine

## 2017-05-11 DIAGNOSIS — D508 Other iron deficiency anemias: Secondary | ICD-10-CM

## 2017-05-11 DIAGNOSIS — E1122 Type 2 diabetes mellitus with diabetic chronic kidney disease: Secondary | ICD-10-CM

## 2017-05-11 MED ORDER — BASAGLAR KWIKPEN 100 UNIT/ML ~~LOC~~ SOPN: 10.0000 [IU] | PEN_INJECTOR | Freq: Every day | SUBCUTANEOUS | Status: DC

## 2017-05-11 MED ORDER — FERROUS SULFATE 325 (65 FE) MG PO TABS: ORAL_TABLET | ORAL | Status: DC

## 2017-05-11 NOTE — Progress Notes (Signed)
Diabetes:  Using medications without difficulties: yes Hypoglycemic episodes: no Hyperglycemic episodes: no Feet problems:  occ tingling noted, not constant.   Blood Sugars averaging: 150-170s usually in the AM.  occ higher the day after eating later at night.   eye exam within last year: done 3 weeks ago per patient report.  No retinopathy known.   Labs d/w pt.  Goal A1c ~8, d/w pt.   IDA, still on iron 5 days a week with black stools as expected.  Labs d/w pt.  Iron level stable.    Still on abx for his knee at baseline.  No new knee pain.  No fevers.    PMH and SH reviewed  Meds, vitals, and allergies reviewed.   ROS: Per HPI unless specifically indicated in ROS section   GEN: nad, alert and oriented HEENT: mucous membranes moist NECK: supple w/o LA CV: IRR.  Not tachy PULM: ctab, no inc wob ABD: soft, +bs EXT: no edema SKIN: no acute rash

## 2017-05-11 NOTE — Patient Instructions (Signed)
Recheck labs prior to a visit in about 4 months.   Take care.  Glad to see you.  Update me as needed.

## 2017-05-12 NOTE — Assessment & Plan Note (Signed)
  IDA, still on iron 5 days a week with black stools as expected.  Labs d/w pt.  Iron level stable.   Likely safer to continue routine iron replacement as is and periodically monitor his labs instead of putting him through more invasive workup which could be hazardous given his other comorbid conditions.  He agrees.  Update me as needed.

## 2017-05-12 NOTE — Assessment & Plan Note (Signed)
eye exam within last year: done 3 weeks ago per patient report.  No retinopathy known.   Labs d/w pt.  Goal A1c ~8, d/w pt.  Given his situation, would continue as is.  I do not want to induce hypoglycemia.  He agrees.

## 2017-05-27 LAB — CUP PACEART REMOTE DEVICE CHECK
Battery Remaining Longevity: 87 mo
Battery Remaining Percentage: 95.5 %
Battery Voltage: 3.01 V
Brady Statistic AS VS Percent: 1 %
Brady Statistic RA Percent Paced: 8.5 %
Implantable Lead Implant Date: 20160321
Implantable Lead Implant Date: 20160321
Implantable Lead Location: 753860
Implantable Pulse Generator Implant Date: 20160321
Lead Channel Impedance Value: 390 Ohm
Lead Channel Pacing Threshold Amplitude: 1.75 V
Lead Channel Pacing Threshold Pulse Width: 0.5 ms
Lead Channel Pacing Threshold Pulse Width: 0.8 ms
Lead Channel Sensing Intrinsic Amplitude: 1.8 mV
Lead Channel Setting Pacing Amplitude: 2.5 V
MDC IDC LEAD LOCATION: 753859
MDC IDC MSMT LEADCHNL RA IMPEDANCE VALUE: 390 Ohm
MDC IDC MSMT LEADCHNL RV PACING THRESHOLD AMPLITUDE: 0.75 V
MDC IDC MSMT LEADCHNL RV SENSING INTR AMPL: 10.9 mV
MDC IDC PG SERIAL: 7734710
MDC IDC SESS DTM: 20181210070014
MDC IDC SET LEADCHNL RA PACING AMPLITUDE: 3 V
MDC IDC SET LEADCHNL RV PACING PULSEWIDTH: 0.5 ms
MDC IDC SET LEADCHNL RV SENSING SENSITIVITY: 8 mV
MDC IDC STAT BRADY AP VP PERCENT: 8.7 %
MDC IDC STAT BRADY AP VS PERCENT: 1 %
MDC IDC STAT BRADY AS VP PERCENT: 91 %
MDC IDC STAT BRADY RV PERCENT PACED: 99 %

## 2017-06-10 ENCOUNTER — Encounter: Payer: Self-pay | Admitting: Internal Medicine

## 2017-06-10 ENCOUNTER — Ambulatory Visit: Payer: Medicare Other | Admitting: Internal Medicine

## 2017-06-10 DIAGNOSIS — T8454XD Infection and inflammatory reaction due to internal left knee prosthesis, subsequent encounter: Secondary | ICD-10-CM | POA: Diagnosis not present

## 2017-06-10 MED ORDER — AMOXICILLIN 500 MG PO CAPS: 500.0000 mg | ORAL_CAPSULE | Freq: Two times a day (BID) | ORAL | 3 refills | Status: DC

## 2017-06-10 NOTE — Assessment & Plan Note (Signed)
He is doing well on chronic suppressive amoxicillin therapy for enterococcal prosthetic joint infection.  He prefers to continue it for now.  He will follow-up in 1 year.

## 2017-06-10 NOTE — Progress Notes (Signed)
[facility] for Infectious Disease  Patient Active Problem List   Diagnosis Date Noted  . Infection of prosthetic left knee joint ([place]) 07/29/2015    Priority: High  . Coronary artery disease 07/29/2015  . Stroke ([place]) 12/24/2014  . Double vision 12/23/2014  . Pacemaker 11/16/2014  . Anemia 09/27/2014  . Gastrointestinal hemorrhage associated with peptic ulcer 09/07/2014  . Atrial fibrillation-CHADS VASC2 = 5 (age, HTN, DM, Vasc) 08/13/2014  . Chest pain 08/11/2014  . Complete heart block ([place]) 08/11/2014  . S/P CABG x 5 1989. Low risk Myoview 2012 08/11/2014  . Syncope 08/11/2014  . Cough 06/14/2014  . Left foot pain 02/23/2014  . Gouty arthropathy 08/09/2013  . Chronic radicular lumbar pain 12/15/2012  . PVD- s/p bilat CEA- CA dopplers OK Jan 2015 10/06/2012  . Insomnia 09/30/2011  . OTHER SPECIFIED SITES OF SPRAINS AND STRAINS 04/25/2010  . DIVERTICULITIS OF COLON 02/21/2010  . TEMPOROMANDIBULAR JOINT DISORDER 11/22/2009  . DM (diabetes mellitus), type 2 with renal complications ([place]) 14/48/1856  . Chronic kidney disease, stage III (moderate) ([place]) 07/26/2008  . CHRONIC PROSTATITIS 07/26/2008  . ADVEF, DRUG/MEDICINAL/BIOLOGICAL SUBST NOS 03/16/2007  . Dyslipidemia 11/15/2006  . Essential hypertension 11/15/2006  . GERD 11/15/2006  . Osteoarthritis 11/15/2006    Patient's Medications  New Prescriptions   No medications on file  Previous Medications   AMITIZA 8 MCG CAPSULE    TAKE ONE CAPSULE BY MOUTH EACH MORNING   AMLODIPINE (NORVASC) 2.5 MG TABLET    Take 1 tablet (2.5 mg total) by mouth daily.   CLOTRIMAZOLE-BETAMETHASONE (LOTRISONE) CREAM    Apply 1 application topically 2 (two) times daily as needed (Use as directed).   DILTIAZEM (TIAZAC) 360 MG 24 HR CAPSULE    TAKE ONE CAPSULE BY MOUTH DAILY   ELIQUIS 2.5 MG TABS TABLET    TAKE 1 TABLET BY MOUTH TWICE A DAY   FERROUS SULFATE 325 (65 FE) MG TABLET    TAKE 1 TABLET BY MOUTH 5 DAYS A WEEK   GLIPIZIDE (GLUCOTROL) 5 MG TABLET    TAKE 2 TABLETS BY MOUTH DAILY BEFORE BREAKFAST AND 1 TABLET BEFORE SUPPER   INSULIN GLARGINE (BASAGLAR KWIKPEN) 100 UNIT/ML SOPN    Inject 0.1 mLs (10 Units total) into the skin at bedtime.   JANUVIA 50 MG TABLET    TAKE 1 TABLET BY MOUTH DAILY   LEADER UNIFINE PENTIPS 31G X 5 MM MISC    USE DAILY WITH INSULIN PEN   LOSARTAN (COZAAR) 50 MG TABLET    Take 50 mg by mouth daily.   OMEPRAZOLE (PRILOSEC) 40 MG CAPSULE    Take 1 capsule (40 mg total) by mouth daily.   OPHTHALMIC IRRIGATION SOLUTION (EYE WASH) 99.05 % SOLN    Place 1 drop into both eyes 2 (two) times daily.   ROSUVASTATIN (CRESTOR) 20 MG TABLET    Take 1 tablet (20 mg total) by mouth daily.  Modified Medications   Modified Medication Previous Medication   AMOXICILLIN (AMOXIL) 500 MG CAPSULE amoxicillin (AMOXIL) 500 MG capsule      Take 1 capsule (500 mg total) by mouth 2 (two) times daily.    TAKE ONE (1) CAPSULE BY MOUTH 2 TIMES DAILY  Discontinued Medications   No medications on file    Subjective: Mr. Murphy is in for his routine follow-up visit.  He is accompanied by his wife.  He continues to take amoxicillin as chronic suppressive therapy for his relapsed enterococcal  left prosthetic knee infection.  He has had no problems tolerating his amoxicillin.  He still occasionally has some throbbing discomfort in his knee when he is in bed at night.  If he repositions his leg it goes away.  He does not feel he needs to take any pain medication.  If this is not a new problem.  Review of Systems: Review of Systems  Constitutional: Negative for chills, diaphoresis and fever.  Gastrointestinal: Negative for abdominal pain, diarrhea, nausea and vomiting.  Musculoskeletal: Positive for joint pain.    Past Medical History:  Diagnosis Date  . Anemia   . Arthritis   . CAD (coronary artery disease)    a. CABG 1989, b. Myoview low risk 2012.  . Carotid artery occlusion    a. Duplex 10/2014: patent R/L  CEA with mild hyperplasia in right surgical bulb - followed by vascular.  . CHB (complete heart block) [facility]) March 2016   a. s/p STJ dual chamber pacemaker 07/2014.  [name] Chronic diastolic CHF (congestive heart failure) ([place])    a. Dx 05/6107 - acute diastolic CHF in the setting of CHB.  [name] Chronic kidney disease, stage IV (severe) (HCC)    stage III/IV as of 2015, [name]  . CKD (chronic kidney disease), stage IV ([place])   . Colon polyps   . Complication of anesthesia   . Constipation   . CVA (cerebral infarction)   . Diabetes mellitus    type II  . Diverticulosis   . Dupuytren's disease    finger right hand contracted  . Essential hypertension   . GERD (gastroesophageal reflux disease)   . Hemorrhoids   . Hyperlipidemia   . Myocardial infarction [facility]) 1976  and Mar. 19, 2016  . Orthostasis   . Osteoporosis   . PAF (paroxysmal atrial fibrillation) [facility]) March 2016  . PONV (postoperative nausea and vomiting)   . Presence of permanent cardiac pacemaker   . Shortness of breath dyspnea    due to pain    Social History   Tobacco Use  . Smoking status: Former Smoker    Packs/day: 1.00    Years: 25.00    Pack years: 25.00    Types: Cigarettes    Last attempt to quit: 01/21/1973    Years since quitting: 44.4  . Smokeless tobacco: Never Used  . Tobacco comment: began smoking in high school, quit age 23  Substance Use Topics  . Alcohol use: Yes    Alcohol/week: 0.0 oz    Comment: 1-2 per week  . Drug use: No    Family History  Problem Relation Age of Onset  . Heart disease Mother        Before age 32  . Diabetes Mother   . Varicose Veins Mother   . Heart attack Mother   . Heart attack Father   . Heart disease Father        After age 63  . Hypertension Father   . Heart disease Brother        Before age 59  . Hypertension Brother   . Heart attack Brother   . Diabetes Son   . Hypertension Son   . Hypertension Son   . Diabetes Son   . Diabetes Unknown   .  Cancer Unknown   . Colon cancer Neg Hx     Allergies  Allergen Reactions  . Reclast [Zoledronic Acid] Other (See Comments)    Stopped 2015 due to Creatine  Objective: Vitals:   06/10/17 0913  BP: (!) 151/73  Pulse: (!) 101  Temp: 98.6 F (37 C)  TempSrc: Oral  Weight: 204 lb (92.5 kg)   Body mass index is 30.13 kg/m.  Physical Exam  Constitutional: No distress.  Neurological: He is alert.  Skin: No rash noted.  Psychiatric: Mood and affect normal.    Lab Results    Problem List Items Addressed This Visit      High   Infection of prosthetic left knee joint ([place])    He is doing well on chronic suppressive amoxicillin therapy for enterococcal prosthetic joint infection.  He prefers to continue it for now.  He will follow-up in 1 year.      Relevant Medications   amoxicillin (AMOXIL) 500 MG capsule       Michel Bickers, MD [facility] for Infectious [place] [phone] pager   [phone] cell 06/10/2017, 9:30 AM

## 2017-07-26 ENCOUNTER — Other Ambulatory Visit: Payer: Self-pay | Admitting: Family Medicine

## 2017-07-27 ENCOUNTER — Ambulatory Visit: Payer: Self-pay | Admitting: *Deleted

## 2017-07-27 ENCOUNTER — Ambulatory Visit: Payer: Medicare Other | Admitting: Internal Medicine

## 2017-07-27 ENCOUNTER — Encounter: Payer: Self-pay | Admitting: Internal Medicine

## 2017-07-27 VITALS — BP 146/70 | HR 80 | Temp 97.7°F | Wt 202.0 lb

## 2017-07-27 DIAGNOSIS — K1379 Other lesions of oral mucosa: Secondary | ICD-10-CM

## 2017-07-27 MED ORDER — MAGIC MOUTHWASH W/LIDOCAINE: 5.0000 mL | Freq: Four times a day (QID) | ORAL | 0 refills | Status: DC

## 2017-07-27 NOTE — Telephone Encounter (Signed)
Patient has been sick for some time- he has been using OTC treatment. He has throat tightness and mouth swelling- like yeast in it. Mouth feels raw and swollen.  Reason for Disposition . [1] SEVERE mouth pain (e.g., excruciating) AND [2] not improved after 2 hours of pain medicine  Answer Assessment - Initial Assessment Questions 1. SYMPTOM: "What's the main symptom you're concerned about?" (e.g., dry mouth. chapped lips, lump)     Mouth swollen and raw feeling 2. ONSET: "When did the  ________  start?"     3 days ago 3. PAIN: "Is there any pain?" If so, ask: "How bad is it?" (Scale: 1-10; mild, moderate, severe)     8 4. CAUSE: "What do you think is causing the symptoms?"     Patient has been sick with started out as cold and congsetion 5. OTHER SYMPTOMS: "Do you have any other symptoms?" (e.g., fever, sore throat, toothache, swelling)     Sore throat 6. PREGNANCY: "Is there any chance you are pregnant?" "When was your last menstrual period?"     n/a  Protocols used: MOUTH PAIN-A-AH, MOUTH [facility]

## 2017-07-27 NOTE — Progress Notes (Signed)
Subjective:    Patient ID: Dean Murphy, male    DOB: 1927-02-21, 84 y.o.   MRN: [id number]  HPI  Pt presents to the clinic today with c/o mouth pain. He reports this started 3 days ago. He feels like his throat is tight and swollen. He denies difficulty swallowing. He thinks he may have some yeast in his mouth although he has not seen any coating on his tongue and he denies a bad taste in his mouth. He has not taken anything OTC for his symptoms. He is on Amoxil BID for infected knee prosthesis.  Review of Systems  Past Medical History:  Diagnosis Date  . Anemia   . Arthritis   . CAD (coronary artery disease)    a. CABG 1989, b. Myoview low risk 2012.  . Carotid artery occlusion    a. Duplex 10/2014: patent R/L CEA with mild hyperplasia in right surgical bulb - followed by vascular.  . CHB (complete heart block) [facility]) March 2016   a. s/p STJ dual chamber pacemaker 07/2014.  [name] Chronic diastolic CHF (congestive heart failure) ([place])    a. Dx 10/3147 - acute diastolic CHF in the setting of CHB.  [name] Chronic kidney disease, stage IV (severe) (HCC)    stage III/IV as of 2015, [name]  . CKD (chronic kidney disease), stage IV ([place])   . Colon polyps   . Complication of anesthesia   . Constipation   . CVA (cerebral infarction)   . Diabetes mellitus    type II  . Diverticulosis   . Dupuytren's disease    finger right hand contracted  . Essential hypertension   . GERD (gastroesophageal reflux disease)   . Hemorrhoids   . Hyperlipidemia   . Myocardial infarction [facility]) 1976  and Mar. 19, 2016  . Orthostasis   . Osteoporosis   . PAF (paroxysmal atrial fibrillation) [facility]) March 2016  . PONV (postoperative nausea and vomiting)   . Presence of permanent cardiac pacemaker   . Shortness of breath dyspnea    due to pain    Current Outpatient Medications  Medication Sig Dispense Refill  . AMITIZA 8 MCG capsule TAKE ONE CAPSULE BY MOUTH EACH MORNING 90 capsule 1  . amLODipine  (NORVASC) 2.5 MG tablet Take 1 tablet (2.5 mg total) by mouth daily. 90 tablet 3  . amoxicillin (AMOXIL) 500 MG capsule Take 1 capsule (500 mg total) by mouth 2 (two) times daily. 180 capsule 3  . clotrimazole-betamethasone (LOTRISONE) cream Apply 1 application topically 2 (two) times daily as needed (Use as directed).    [name] diltiazem (TIAZAC) 360 MG 24 hr capsule TAKE ONE CAPSULE BY MOUTH DAILY 90 capsule 3  . ELIQUIS 2.5 MG TABS tablet TAKE 1 TABLET BY MOUTH TWICE A DAY 180 tablet 3  . ferrous sulfate 325 (65 FE) MG tablet TAKE 1 TABLET BY MOUTH 5 DAYS A WEEK    . glipiZIDE (GLUCOTROL) 5 MG tablet TAKE 2 TABLETS BY MOUTH EVERY MORNING BEFORE BREAKFAST AND 1 TABLET BEFORE SUPPER 270 tablet 0  . Insulin Glargine (BASAGLAR KWIKPEN) 100 UNIT/ML SOPN Inject 0.1 mLs (10 Units total) into the skin at bedtime.    [name] JANUVIA 50 MG tablet TAKE 1 TABLET BY MOUTH DAILY 90 tablet 1  . LEADER UNIFINE PENTIPS 31G X 5 MM MISC USE DAILY WITH INSULIN PEN 100 each 3  . losartan (COZAAR) 50 MG tablet Take 50 mg by mouth daily.    [name] omeprazole (PRILOSEC) 40  MG capsule Take 1 capsule (40 mg total) by mouth daily. 90 capsule 3  . Ophthalmic Irrigation Solution (EYE WASH) 99.05 % SOLN Place 1 drop into both eyes 2 (two) times daily.    . rosuvastatin (CRESTOR) 20 MG tablet Take 1 tablet (20 mg total) by mouth daily. 90 tablet 2   No current facility-administered medications for this visit.     Allergies  Allergen Reactions  . Reclast [Zoledronic Acid] Other (See Comments)    Stopped 2015 due to Creatine     Family History  Problem Relation Age of Onset  . Heart disease Mother        Before age 89  . Diabetes Mother   . Varicose Veins Mother   . Heart attack Mother   . Heart attack Father   . Heart disease Father        After age 66  . Hypertension Father   . Heart disease Brother        Before age 17  . Hypertension Brother   . Heart attack Brother   . Diabetes Son   . Hypertension Son   .  Hypertension Son   . Diabetes Son   . Diabetes Unknown   . Cancer Unknown   . Colon cancer Neg Hx     Social History   Socioeconomic History  . Marital status: Married    Spouse name: Not on file  . Number of children: Not on file  . Years of education: Not on file  . Highest education level: Not on file  Social Needs  . Financial resource strain: Not on file  . Food insecurity - worry: Not on file  . Food insecurity - inability: Not on file  . Transportation needs - medical: Not on file  . Transportation needs - non-medical: Not on file  Occupational History  . Occupation: retired    Fish farm manager: RETIRED  Tobacco Use  . Smoking status: Former Smoker    Packs/day: 1.00    Years: 25.00    Pack years: 25.00    Types: Cigarettes    Last attempt to quit: 01/21/1973    Years since quitting: 44.5  . Smokeless tobacco: Never Used  . Tobacco comment: began smoking in high school, quit age 72  Substance and Sexual Activity  . Alcohol use: Yes    Alcohol/week: 0.0 oz    Comment: 1-2 per week  . Drug use: No  . Sexual activity: No  Other Topics Concern  . 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  Remarried 1998.     3 sons     Constitutional: Denies fever, malaise, fatigue, headache or abrupt weight changes.  HEENT: Pt reports mouth pain. Denies eye pain, eye redness, ear pain, ringing in the ears, wax buildup, runny nose, nasal congestion, bloody nose. Respiratory: Denies difficulty breathing, shortness of breath, cough or sputum production.    No other specific complaints in a complete review of systems (except as listed in HPI above).     Objective:   Physical Exam  BP (!) 146/70   Pulse 80   Temp 97.7 F (36.5 C) (Oral)   Wt 202 lb (91.6 kg)   SpO2 97%   BMI 29.83 kg/m  Wt Readings from Last 3 Encounters:  07/27/17 202 lb (91.6 kg)  06/10/17 204 lb (92.5 kg)  05/11/17 207 lb 8 oz  (94.1  kg)    General: Appears his stated age, well developed, well nourished in NAD. HEENT: Head: normal shape and size; Throat/Mouth: Teeth present, mucosa pink and moist, no exudate, lesions or ulcerations noted. Uvula swollen. Neck:  No adenopathy noted. Heart: RRR, murmur noted. Pulmonary/Chest: Normal effort and positive vesicular breath sounds. No respiratory distress. No wheezes, rales or ronchi noted.    BMET    Component Value Date/Time   NA 143 04/30/2017 0902   K 4.9 04/30/2017 0902   CL 109 04/30/2017 0902   CO2 28 04/30/2017 0902   GLUCOSE 175 (H) 04/30/2017 0902   GLUCOSE 120 (H) 04/28/2006 0917   BUN 19 04/30/2017 0902   CREATININE 1.60 (H) 04/30/2017 0902   CREATININE 2.49 (H) 02/27/2016 1612   CALCIUM 9.2 04/30/2017 0902   GFRNONAA 30 (L) 12/10/2015 1052   GFRAA 35 (L) 12/10/2015 1052    Lipid Panel     Component Value Date/Time   CHOL 111 12/29/2016 0854   TRIG 98.0 12/29/2016 0854   TRIG 62 04/28/2006 0917   HDL 38.40 (L) 12/29/2016 0854   CHOLHDL 3 12/29/2016 0854   VLDL 19.6 12/29/2016 0854   LDLCALC 53 12/29/2016 0854    CBC    Component Value Date/Time   WBC 7.9 04/30/2017 0902   RBC 4.86 04/30/2017 0902   HGB 15.2 04/30/2017 0902   HCT 44.8 04/30/2017 0902   PLT 148.0 (L) 04/30/2017 0902   MCV 92.1 04/30/2017 0902   MCH 30.6 02/27/2016 1612   MCHC 33.9 04/30/2017 0902   RDW 13.0 04/30/2017 0902   LYMPHSABS 2.4 04/30/2017 0902   MONOABS 0.5 04/30/2017 0902   EOSABS 0.2 04/30/2017 0902   BASOSABS 0.1 04/30/2017 0902    Hgb A1C Lab Results  Component Value Date   HGBA1C 8.3 (H) 04/30/2017            Assessment & Plan:   Sore Throat:  No signs of thrush eRx for Magic Mouthwash with Lidocaine Tylenol as needed for pain, can not take NSAID's  Return precautions discussed Webb Silversmith, NP

## 2017-07-27 NOTE — Telephone Encounter (Signed)
Has OV scheduled- get me if needed. Thanks for seeing patient.

## 2017-07-27 NOTE — Patient Instructions (Signed)

## 2017-08-02 ENCOUNTER — Ambulatory Visit (INDEPENDENT_AMBULATORY_CARE_PROVIDER_SITE_OTHER): Payer: Medicare Other | Admitting: *Deleted

## 2017-08-02 DIAGNOSIS — I442 Atrioventricular block, complete: Secondary | ICD-10-CM | POA: Diagnosis not present

## 2017-08-02 NOTE — Progress Notes (Signed)
Remote pacemaker transmission.   

## 2017-08-04 ENCOUNTER — Encounter: Payer: Self-pay | Admitting: Cardiology

## 2017-08-17 ENCOUNTER — Encounter: Payer: Self-pay | Admitting: Cardiology

## 2017-08-21 LAB — CUP PACEART REMOTE DEVICE CHECK
Battery Voltage: 3.01 V
Brady Statistic AP VP Percent: 8.4 %
Brady Statistic AP VS Percent: 1 %
Brady Statistic AS VP Percent: 91 %
Brady Statistic AS VS Percent: 1 %
Implantable Lead Implant Date: 20160321
Implantable Lead Implant Date: 20160321
Implantable Lead Location: 753859
Lead Channel Impedance Value: 510 Ohm
Lead Channel Pacing Threshold Amplitude: 0.75 V
Lead Channel Pacing Threshold Amplitude: 1.75 V
Lead Channel Pacing Threshold Pulse Width: 0.5 ms
Lead Channel Setting Pacing Amplitude: 2.5 V
Lead Channel Setting Pacing Pulse Width: 0.5 ms
MDC IDC LEAD LOCATION: 753860
MDC IDC MSMT BATTERY REMAINING LONGEVITY: 91 mo
MDC IDC MSMT BATTERY REMAINING PERCENTAGE: 95.5 %
MDC IDC MSMT LEADCHNL RA IMPEDANCE VALUE: 390 Ohm
MDC IDC MSMT LEADCHNL RA PACING THRESHOLD PULSEWIDTH: 0.8 ms
MDC IDC MSMT LEADCHNL RA SENSING INTR AMPL: 1.8 mV
MDC IDC MSMT LEADCHNL RV SENSING INTR AMPL: 10.9 mV
MDC IDC PG IMPLANT DT: 20160321
MDC IDC PG SERIAL: 7734710
MDC IDC SESS DTM: 20190311070013
MDC IDC SET LEADCHNL RA PACING AMPLITUDE: 3 V
MDC IDC SET LEADCHNL RV SENSING SENSITIVITY: 8 mV
MDC IDC STAT BRADY RA PERCENT PACED: 8.2 %
MDC IDC STAT BRADY RV PERCENT PACED: 99 %

## 2017-08-27 ENCOUNTER — Ambulatory Visit: Payer: Medicare Other | Admitting: Cardiology

## 2017-08-27 ENCOUNTER — Encounter: Payer: Self-pay | Admitting: Cardiology

## 2017-08-27 VITALS — BP 156/60 | HR 69 | Ht 69.0 in | Wt 208.4 lb

## 2017-08-27 DIAGNOSIS — I10 Essential (primary) hypertension: Secondary | ICD-10-CM | POA: Diagnosis not present

## 2017-08-27 DIAGNOSIS — E782 Mixed hyperlipidemia: Secondary | ICD-10-CM | POA: Diagnosis not present

## 2017-08-27 DIAGNOSIS — I442 Atrioventricular block, complete: Secondary | ICD-10-CM | POA: Diagnosis not present

## 2017-08-27 DIAGNOSIS — Z950 Presence of cardiac pacemaker: Secondary | ICD-10-CM | POA: Diagnosis not present

## 2017-08-27 DIAGNOSIS — R011 Cardiac murmur, unspecified: Secondary | ICD-10-CM | POA: Diagnosis not present

## 2017-08-27 DIAGNOSIS — Z9889 Other specified postprocedural states: Secondary | ICD-10-CM | POA: Diagnosis not present

## 2017-08-27 DIAGNOSIS — I480 Paroxysmal atrial fibrillation: Secondary | ICD-10-CM | POA: Diagnosis not present

## 2017-08-27 NOTE — Patient Instructions (Signed)
Medication Instructions:   Your physician recommends that you continue on your current medications as directed. Please refer to the Current Medication list given to you today.    Testing/Procedures:  Your physician has requested that you have an echocardiogram. Echocardiography is a painless test that uses sound waves to create images of your heart. It provides your doctor with information about the size and shape of your heart and how well your heart's chambers and valves are working. This procedure takes approximately one hour. There are no restrictions for this procedure.    Follow-Up:  Your physician wants you to follow-up in: 6 months with Dr Johann Capers will receive a reminder letter in the mail two months in advance. If you don't receive a letter, please call our office to schedule the follow-up appointment.        If you need a refill on your cardiac medications before your next appointment, please call your pharmacy.

## 2017-08-27 NOTE — Progress Notes (Signed)
Patient ID: Dean Murphy, male   DOB: [birth year], 84 y.o.   MRN: [id number]     Cardiology Office Note Date:  08/27/2017  Patient ID:  Dean Murphy, Dean Murphy Jun 26, 1926, MRN [id number] PCP:  Tonia Ghent, MD  Cardiologist: Dr. Meda Coffee  Chief Complaint, reason for visit: 6 months follow up  History of Present Illness: Dean Murphy is a 84 y.o. male with history of CAD s/p CABG 1989, CKD stage IV, HTN with history of orthostasis, DM, anemia, CHB 05/7508 (complicated by acute diastolic CHF) s/p PPM, CVA, carotid disease s/p CEAs (followed by [facility]), hyperlipidemia, PAF who presents for f/u exertional fatigue and low BP. He was seen by Melina Copa in 02/2015 for decreased stamina and tendency toward lower blood pressures. Dr. Damita Dunnings cut his amlodipine in half to 2.5mg  daily. Since that time the patient says he feels "100% better." Even his sons have noticed that he has had increased stamina. Dyspnea has improved tremendously and he no longer feels lethargic. He has not had any chest pain or pressure. He follows his BP at home and this AM got a reading of 130/75.  07/06/2016 - the patient is coming after 6 months, cardio-wise he feels great he denies any chest pain shortness of breath she has minimal left lower extremity edema, he is no palpitations or falls. 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 He is planning crus to [facility] next week. His pacemaker was checked last 3 functioning well. No bleeding with Eliquis  Last echo 07/2014 showed mild LVH, EF 25-85%, normal diastolic function, mild MR.  02/25/2017 - 6 months follow up, the patient came back from river cruise on [place] and enjoyed it very much. He denies any chest pain, SOB, his LE edema has resolved, no palpitations, claudications, no dizziness or falls.  He has been compliant  with his medications, he denies any bleeding with anticoagulation and no muscle pain with Crestor. No falls.  08/27/2017 - 6 months follow-up, the patient is looking and feeling great, she denies any chest pain, shortness of breath, no lower extremity edema no dizziness or falls or syncope. He hasn't taken his medications today yet but otherwise is compliant and has no side effects.  Past Medical History:  Diagnosis Date  . Anemia   . Arthritis   . CAD (coronary artery disease)    a. CABG 1989, b. Myoview low risk 2012.  . Carotid artery occlusion    a. Duplex 10/2014: patent R/L CEA with mild hyperplasia in right surgical bulb - followed by vascular.  . CHB (complete heart block) [facility]) March 2016   a. s/p STJ dual chamber pacemaker 07/2014.  [name] Chronic diastolic CHF (congestive heart failure) ([place])    a. Dx 06/7780 - acute diastolic CHF in the setting of CHB.  [name] Chronic kidney disease, stage IV (severe) (HCC)    stage III/IV as of 2015, [name]  . CKD (chronic kidney disease), stage IV ([place])   . Colon polyps   . Complication of anesthesia   . Constipation   . CVA (cerebral infarction)   . Diabetes mellitus    type II  . Diverticulosis   . Dupuytren's disease    finger right hand contracted  . Essential hypertension   . GERD (gastroesophageal reflux disease)   . Hemorrhoids   .  Hyperlipidemia   . Myocardial infarction [facility]) 1976  and Mar. 19, 2016  . Orthostasis   . Osteoporosis   . PAF (paroxysmal atrial fibrillation) [facility]) March 2016  . PONV (postoperative nausea and vomiting)   . Presence of permanent cardiac pacemaker   . Shortness of breath dyspnea    due to pain    Past Surgical History:  Procedure Laterality Date  . CARDIAC CATHETERIZATION  08/11/2014   Procedure: TEMPORARY PACEMAKER;  Surgeon: [name], MD;  Location: [facility] CATH LAB;  Service: Cardiovascular;;  . CAROTID ENDARTERECTOMY  12/31/10   Right  . CAROTID ENDARTERECTOMY  02/09/11   left  .  CHOLECYSTECTOMY    . CORONARY ARTERY BYPASS GRAFT  1989  . EYE SURGERY Bilateral    Cataract with implants  . I&D KNEE WITH POLY EXCHANGE Left 07/29/2015   Procedure: IRRIGATION AND DEBRIDEMENT LEFT KNEE WITH POLY EXCHANGE;  Surgeon: Rod Can, MD;  Location: [place];  Service: Orthopedics;  Laterality: Left;  . IR GENERIC HISTORICAL  03/04/2016   IR US GUIDE VASC ACCESS RIGHT 03/04/2016 Marybelle Killings, MD [facility]INTER[facility] RAD  . IR GENERIC HISTORICAL  03/04/2016   IR FLUORO GUIDE CV LINE RIGHT 03/04/2016 Marybelle Killings, MD [facility]  . JOINT REPLACEMENT     bilat. knees  . PERMANENT PACEMAKER INSERTION N/A 08/13/2014   STJ dual chamber pacemaker implanted by [name] for CHB  . TONSILLECTOMY    . TOTAL KNEE ARTHROPLASTY     bilateral    Current Outpatient Medications  Medication Sig Dispense Refill  . AMITIZA 8 MCG capsule TAKE ONE CAPSULE BY MOUTH EACH MORNING 90 capsule 1  . amLODipine (NORVASC) 2.5 MG tablet Take 1 tablet (2.5 mg total) by mouth daily. 90 tablet 3  . amoxicillin (AMOXIL) 500 MG capsule Take 1 capsule (500 mg total) by mouth 2 (two) times daily. 180 capsule 3  . clotrimazole-betamethasone (LOTRISONE) cream Apply 1 application topically 2 (two) times daily as needed (Use as directed).    [name] diltiazem (TIAZAC) 360 MG 24 hr capsule TAKE ONE CAPSULE BY MOUTH DAILY 90 capsule 3  . ELIQUIS 2.5 MG TABS tablet TAKE 1 TABLET BY MOUTH TWICE A DAY 180 tablet 3  . ferrous sulfate 325 (65 FE) MG tablet TAKE 1 TABLET BY MOUTH 5 DAYS A WEEK    . glipiZIDE (GLUCOTROL) 5 MG tablet TAKE 2 TABLETS BY MOUTH EVERY MORNING BEFORE BREAKFAST AND 1 TABLET BEFORE SUPPER 270 tablet 0  . Insulin Glargine (BASAGLAR KWIKPEN) 100 UNIT/ML SOPN Inject 0.1 mLs (10 Units total) into the skin at bedtime.    [name] JANUVIA 50 MG tablet TAKE 1 TABLET BY MOUTH DAILY 90 tablet 1  . LEADER UNIFINE PENTIPS 31G X 5 MM MISC USE DAILY WITH INSULIN PEN 100 each 3  . losartan (COZAAR) 50 MG tablet Take 50 mg by mouth daily.     . magic mouthwash w/lidocaine SOLN Take 5 mLs by mouth 4 (four) times daily. 240 mL 0  . omeprazole (PRILOSEC) 40 MG capsule Take 1 capsule (40 mg total) by mouth daily. 90 capsule 3  . Ophthalmic Irrigation Solution (EYE WASH) 99.05 % SOLN Place 1 drop into both eyes 2 (two) times daily.    . rosuvastatin (CRESTOR) 20 MG tablet Take 1 tablet (20 mg total) by mouth daily. 90 tablet 2   No current facility-administered medications for this visit.     Allergies:   Reclast [zoledronic acid]   Social History:  The patient  reports that he quit smoking about 44 years ago. His smoking use included cigarettes. He has a 25.00 pack-year smoking history. He has never used smokeless tobacco. He reports that he drinks alcohol. He reports that he does not use drugs.   Family History:  The patient's family history includes Cancer in his unknown relative; Diabetes in his mother, son, son, and unknown relative; Heart attack in his brother, father, and mother; Heart disease in his brother, father, and mother; Hypertension in his brother, father, son, and son; Varicose Veins in his mother.  ROS:  Please see the history of present illness.   All other systems are reviewed and otherwise negative.   PHYSICAL EXAM:  VS:  BP (!) 156/60   Pulse 69   Ht 5\' 9"  (1.753 m)   Wt 208 lb 6.4 oz (94.5 kg)   SpO2 93%   BMI 30.78 kg/m  BMI: Body mass index is 30.78 kg/m. Well nourished, well developed WM, in no acute distress - lively, acts younger than stated age  13: normocephalic, atraumatic  Neck: no JVD, +right carotid bruit, no masses Cardiac:  normal S1, S2; RRR; no murmurs, rubs, or gallops Lungs:  clear to auscultation bilaterally, no wheezing, rhonchi or rales  Abd: soft, nontender, no hepatomegaly, + BS MS: no deformity or atrophy Ext: no edema  Skin: warm and dry, no rash Neuro:  moves all extremities spontaneously, no focal abnormalities noted, follows commands Psych: euthymic mood, full  affect   EKG:  A sensed V paced rhythm with ventricular rate 76 bpm.  Recent Labs: 12/29/2016: ALT 18 04/30/2017: BUN 19; Creatinine, Ser 1.60; Hemoglobin 15.2; Platelets 148.0; Potassium 4.9; Sodium 143  12/29/2016: Cholesterol 111; HDL 38.40; LDL Cholesterol 53; Total CHOL/HDL Ratio 3; Triglycerides 98.0; VLDL 19.6   CrCl cannot be calculated (Patient's most recent lab result is older than the maximum 21 days allowed.).   Wt Readings from Last 3 Encounters:  08/27/17 208 lb 6.4 oz (94.5 kg)  07/27/17 202 lb (91.6 kg)  06/10/17 204 lb (92.5 kg)    EKG 07/06/2016, normal sinus rhythm, the paced rhythm, unchanged from prior.  Other studies reviewed: Additional studies/records reviewed today include: summarized above    ASSESSMENT AND PLAN:  1. Lower extremity edema and dizziness - resolved after discontinuation of amlodipine, he is feeling great and doesn't need to use any diuretics.  2. CAD - patient is asymptomatic and active.  3. Hypertension -controlled on current regimen. 4. Paroxysmal atrial fibrillation - maintaining NSR. Continue Eliquis. No bleeding, the latest hemoglobin 15. 5. Chronic diastolic CHF - appears euvolemic.   6. Carotid disease - s/p B/L endarterectomy in 2012, stable carotid US in 10/2014, followed by Dr Oneida Alar. 7. Status post pacemaker placement for complete heart block, followed by Dr. Lovena Le.  Follow up in 6 months.  Signed, Ena Dawley, MD  08/27/2017 9:44 AM     [address] [place] [address] [phone] (office)  [phone] (fax)  Patient ID: Dean Murphy, male   DOB: Jul 22, 1926, 84 y.o.   MRN: [id number]     Cardiology Office Note Date:  08/27/2017  Patient ID:  Axavier, Pressley January 28, 1927, MRN [id number] PCP:  Tonia Ghent, MD  Cardiologist: Dr. Meda Coffee  Chief Complaint, reason for visit: 6 months follow up  History of Present Illness: Dean Murphy is a 84 y.o. male with history of CAD  s/p CABG 1989, CKD stage IV, HTN with history of  orthostasis, DM, anemia, CHB 12/2503 (complicated by acute diastolic CHF) s/p PPM, CVA, carotid disease s/p CEAs (followed by [facility]), hyperlipidemia, PAF who presents for f/u exertional fatigue and low BP. He was seen by Melina Copa in 02/2015 for decreased stamina and tendency toward lower blood pressures. Dr. Damita Dunnings cut his amlodipine in half to 2.5mg  daily. Since that time the patient says he feels "100% better." Even his sons have noticed that he has had increased stamina. Dyspnea has improved tremendously and he no longer feels lethargic. He has not had any chest pain or pressure. He follows his BP at home and this AM got a reading of 130/75.  07/06/2016 - the patient is coming after 6 months, cardio-wise he feels great he denies any chest pain shortness of breath she has minimal left lower extremity edema, he is no palpitations or falls. 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 He is planning crus to [facility] next week. His pacemaker was checked last 3 functioning well. No bleeding with Eliquis  Last echo 07/2014 showed mild LVH, EF 39-76%, normal diastolic function, mild MR.  02/25/2017 - 6 months follow up, the patient came back from river cruise on [place] and enjoyed it very much. He denies any chest pain, SOB, his LE edema has resolved, no palpitations, claudications, no dizziness or falls.  He has been compliant with his medications, he denies any bleeding with anticoagulation and no muscle pain with Crestor. No falls.   Past Medical History:  Diagnosis Date  . Anemia   . Arthritis   . CAD (coronary artery disease)    a. CABG 1989, b. Myoview low risk 2012.  . Carotid artery occlusion    a. Duplex 10/2014: patent R/L CEA with mild hyperplasia in right surgical bulb -  followed by vascular.  . CHB (complete heart block) [facility]) March 2016   a. s/p STJ dual chamber pacemaker 07/2014.  [name] Chronic diastolic CHF (congestive heart failure) ([place])    a. Dx 11/3417 - acute diastolic CHF in the setting of CHB.  [name] Chronic kidney disease, stage IV (severe) (HCC)    stage III/IV as of 2015, [name]  . CKD (chronic kidney disease), stage IV ([place])   . Colon polyps   . Complication of anesthesia   . Constipation   . CVA (cerebral infarction)   . Diabetes mellitus    type II  . Diverticulosis   . Dupuytren's disease    finger right hand contracted  . Essential hypertension   . GERD (gastroesophageal reflux disease)   . Hemorrhoids   . Hyperlipidemia   . Myocardial infarction [facility]) 1976  and Mar. 19, 2016  . Orthostasis   . Osteoporosis   . PAF (paroxysmal atrial fibrillation) [facility]) March 2016  . PONV (postoperative nausea and vomiting)   . Presence of permanent cardiac pacemaker   . Shortness of breath dyspnea    due to pain    Past Surgical History:  Procedure Laterality Date  . CARDIAC CATHETERIZATION  08/11/2014   Procedure: TEMPORARY PACEMAKER;  Surgeon: [name], MD;  Location: [facility] CATH LAB;  Service: Cardiovascular;;  . CAROTID ENDARTERECTOMY  12/31/10   Right  . CAROTID ENDARTERECTOMY  02/09/11   left  . CHOLECYSTECTOMY    . CORONARY ARTERY BYPASS GRAFT  1989  . EYE SURGERY Bilateral  Cataract with implants  . I&D KNEE WITH POLY EXCHANGE Left 07/29/2015   Procedure: IRRIGATION AND DEBRIDEMENT LEFT KNEE WITH POLY EXCHANGE;  Surgeon: Rod Can, MD;  Location: [place];  Service: Orthopedics;  Laterality: Left;  . IR GENERIC HISTORICAL  03/04/2016   IR US GUIDE VASC ACCESS RIGHT 03/04/2016 Marybelle Killings, MD [facility]INTER[facility]  . IR GENERIC HISTORICAL  03/04/2016   IR FLUORO GUIDE CV LINE RIGHT 03/04/2016 Marybelle Killings, MD [facility]  . JOINT REPLACEMENT     bilat. knees  . PERMANENT PACEMAKER INSERTION N/A 08/13/2014   STJ dual chamber  pacemaker implanted by [name] for CHB  . TONSILLECTOMY    . TOTAL KNEE ARTHROPLASTY     bilateral    Current Outpatient Medications  Medication Sig Dispense Refill  . AMITIZA 8 MCG capsule TAKE ONE CAPSULE BY MOUTH EACH MORNING 90 capsule 1  . amLODipine (NORVASC) 2.5 MG tablet Take 1 tablet (2.5 mg total) by mouth daily. 90 tablet 3  . amoxicillin (AMOXIL) 500 MG capsule Take 1 capsule (500 mg total) by mouth 2 (two) times daily. 180 capsule 3  . clotrimazole-betamethasone (LOTRISONE) cream Apply 1 application topically 2 (two) times daily as needed (Use as directed).    [name] diltiazem (TIAZAC) 360 MG 24 hr capsule TAKE ONE CAPSULE BY MOUTH DAILY 90 capsule 3  . ELIQUIS 2.5 MG TABS tablet TAKE 1 TABLET BY MOUTH TWICE A DAY 180 tablet 3  . ferrous sulfate 325 (65 FE) MG tablet TAKE 1 TABLET BY MOUTH 5 DAYS A WEEK    . glipiZIDE (GLUCOTROL) 5 MG tablet TAKE 2 TABLETS BY MOUTH EVERY MORNING BEFORE BREAKFAST AND 1 TABLET BEFORE SUPPER 270 tablet 0  . Insulin Glargine (BASAGLAR KWIKPEN) 100 UNIT/ML SOPN Inject 0.1 mLs (10 Units total) into the skin at bedtime.    [name] JANUVIA 50 MG tablet TAKE 1 TABLET BY MOUTH DAILY 90 tablet 1  . LEADER UNIFINE PENTIPS 31G X 5 MM MISC USE DAILY WITH INSULIN PEN 100 each 3  . losartan (COZAAR) 50 MG tablet Take 50 mg by mouth daily.    . magic mouthwash w/lidocaine SOLN Take 5 mLs by mouth 4 (four) times daily. 240 mL 0  . omeprazole (PRILOSEC) 40 MG capsule Take 1 capsule (40 mg total) by mouth daily. 90 capsule 3  . Ophthalmic Irrigation Solution (EYE WASH) 99.05 % SOLN Place 1 drop into both eyes 2 (two) times daily.    . rosuvastatin (CRESTOR) 20 MG tablet Take 1 tablet (20 mg total) by mouth daily. 90 tablet 2   No current facility-administered medications for this visit.     Allergies:   Reclast [zoledronic acid]   Social History:  The patient  reports that he quit smoking about 44 years ago. His smoking use included cigarettes. He has a 25.00 pack-year  smoking history. He has never used smokeless tobacco. He reports that he drinks alcohol. He reports that he does not use drugs.   Family History:  The patient's family history includes Cancer in his unknown relative; Diabetes in his mother, son, son, and unknown relative; Heart attack in his brother, father, and mother; Heart disease in his brother, father, and mother; Hypertension in his brother, father, son, and son; Varicose Veins in his mother.  ROS:  Please see the history of present illness.   All other systems are reviewed and otherwise negative.   PHYSICAL EXAM:  VS:  BP (!) 156/60   Pulse 69   Ht  5\' 9"  (1.753 m)   Wt 208 lb 6.4 oz (94.5 kg)   SpO2 93%   BMI 30.78 kg/m  BMI: Body mass index is 30.78 kg/m. Well nourished, well developed WM, in no acute distress - lively, acts younger than stated age  44: normocephalic, atraumatic  Neck: no JVD, +right carotid bruit, no masses Cardiac:  normal S1, S2; RRR; no murmurs, rubs, or gallops Lungs:  clear to auscultation bilaterally, no wheezing, rhonchi or rales  Abd: soft, nontender, no hepatomegaly, + BS MS: no deformity or atrophy Ext: no edema  Skin: warm and dry, no rash Neuro:  moves all extremities spontaneously, no focal abnormalities noted, follows commands Psych: euthymic mood, full affect   EKG:  A sensed V paced rhythm with ventricular rate 76 bpm.  Recent Labs: 12/29/2016: ALT 18 04/30/2017: BUN 19; Creatinine, Ser 1.60; Hemoglobin 15.2; Platelets 148.0; Potassium 4.9; Sodium 143  12/29/2016: Cholesterol 111; HDL 38.40; LDL Cholesterol 53; Total CHOL/HDL Ratio 3; Triglycerides 98.0; VLDL 19.6   CrCl cannot be calculated (Patient's most recent lab result is older than the maximum 21 days allowed.).   Wt Readings from Last 3 Encounters:  08/27/17 208 lb 6.4 oz (94.5 kg)  07/27/17 202 lb (91.6 kg)  06/10/17 204 lb (92.5 kg)    EKG 07/06/2016, normal sinus rhythm, the paced rhythm, unchanged from prior.  Other  studies reviewed: Additional studies/records reviewed today include: summarized above    ASSESSMENT AND PLAN:  1. Systolic murmur - last echo in 2016, only mild mitral regurgitation than, I have suspicion for aortic stenosis we will obtain another echocardiogram.  2. Lower extremity edema and dizziness - resolved after discontinuation of amlodipine, he is feeling great and doesn't need to use any diuretics.   3. CAD - patient is asymptomatic and active.   4. Hypertension -controlled on current regimen. He hasn't taken his medications today yet.  5. Paroxysmal atrial fibrillation - maintaining NSR. Continue Eliquis. No bleeding.  6. Chronic diastolic CHF - appears euvolemic.    7. Carotid disease - s/p B/L endarterectomy in 2012, stable carotid US in 10/2014, followed by Dr Oneida Alar.  8. Status post pacemaker placement for complete heart block, followed by Dr. Lovena Le.  Follow up in 6 months.  Signed, Ena Dawley, MD  08/27/2017 9:44 AM     [facility] [address] [place] [address] [phone] (office)  [phone] (fax)

## 2017-08-30 ENCOUNTER — Other Ambulatory Visit: Payer: Self-pay | Admitting: Cardiology

## 2017-09-02 ENCOUNTER — Ambulatory Visit (HOSPITAL_COMMUNITY): Payer: Medicare Other | Attending: Cardiovascular Disease

## 2017-09-02 ENCOUNTER — Other Ambulatory Visit: Payer: Self-pay

## 2017-09-02 DIAGNOSIS — E785 Hyperlipidemia, unspecified: Secondary | ICD-10-CM | POA: Insufficient documentation

## 2017-09-02 DIAGNOSIS — I119 Hypertensive heart disease without heart failure: Secondary | ICD-10-CM | POA: Diagnosis not present

## 2017-09-02 DIAGNOSIS — I252 Old myocardial infarction: Secondary | ICD-10-CM | POA: Insufficient documentation

## 2017-09-02 DIAGNOSIS — I340 Nonrheumatic mitral (valve) insufficiency: Secondary | ICD-10-CM | POA: Insufficient documentation

## 2017-09-02 DIAGNOSIS — I4891 Unspecified atrial fibrillation: Secondary | ICD-10-CM | POA: Insufficient documentation

## 2017-09-02 DIAGNOSIS — R011 Cardiac murmur, unspecified: Secondary | ICD-10-CM

## 2017-09-02 DIAGNOSIS — Z950 Presence of cardiac pacemaker: Secondary | ICD-10-CM | POA: Diagnosis not present

## 2017-09-02 DIAGNOSIS — E119 Type 2 diabetes mellitus without complications: Secondary | ICD-10-CM | POA: Insufficient documentation

## 2017-09-02 DIAGNOSIS — E782 Mixed hyperlipidemia: Secondary | ICD-10-CM | POA: Insufficient documentation

## 2017-09-02 DIAGNOSIS — I2510 Atherosclerotic heart disease of native coronary artery without angina pectoris: Secondary | ICD-10-CM | POA: Insufficient documentation

## 2017-09-02 DIAGNOSIS — I10 Essential (primary) hypertension: Secondary | ICD-10-CM | POA: Diagnosis not present

## 2017-09-02 DIAGNOSIS — I480 Paroxysmal atrial fibrillation: Secondary | ICD-10-CM | POA: Insufficient documentation

## 2017-09-02 MED ORDER — PERFLUTREN LIPID MICROSPHERE
1.0000 mL | INTRAVENOUS | Status: AC | PRN
  Administered 2017-09-02: 2 mL via INTRAVENOUS

## 2017-09-07 ENCOUNTER — Other Ambulatory Visit (INDEPENDENT_AMBULATORY_CARE_PROVIDER_SITE_OTHER): Payer: Medicare Other

## 2017-09-07 DIAGNOSIS — N189 Chronic kidney disease, unspecified: Secondary | ICD-10-CM | POA: Diagnosis not present

## 2017-09-07 DIAGNOSIS — D508 Other iron deficiency anemias: Secondary | ICD-10-CM | POA: Diagnosis not present

## 2017-09-07 DIAGNOSIS — E1122 Type 2 diabetes mellitus with diabetic chronic kidney disease: Secondary | ICD-10-CM

## 2017-09-07 LAB — CBC WITH DIFFERENTIAL/PLATELET
BASOS ABS: 0.1 10*3/uL (ref 0.0–0.1)
Basophils Relative: 0.8 % (ref 0.0–3.0)
Eosinophils Absolute: 0.2 10*3/uL (ref 0.0–0.7)
Eosinophils Relative: 2.5 % (ref 0.0–5.0)
HEMATOCRIT: 42.9 % (ref 39.0–52.0)
HEMOGLOBIN: 14.7 g/dL (ref 13.0–17.0)
LYMPHS PCT: 36.5 % (ref 12.0–46.0)
Lymphs Abs: 2.7 10*3/uL (ref 0.7–4.0)
MCHC: 34.3 g/dL (ref 30.0–36.0)
MCV: 90.2 fl (ref 78.0–100.0)
Monocytes Absolute: 0.6 10*3/uL (ref 0.1–1.0)
Monocytes Relative: 7.8 % (ref 3.0–12.0)
Neutro Abs: 3.9 10*3/uL (ref 1.4–7.7)
Neutrophils Relative %: 52.4 % (ref 43.0–77.0)
Platelets: 149 10*3/uL — ABNORMAL LOW (ref 150.0–400.0)
RBC: 4.75 Mil/uL (ref 4.22–5.81)
RDW: 13.9 % (ref 11.5–15.5)
WBC: 7.5 10*3/uL (ref 4.0–10.5)

## 2017-09-07 LAB — BASIC METABOLIC PANEL
BUN: 17 mg/dL (ref 6–23)
CO2: 29 meq/L (ref 19–32)
CREATININE: 1.54 mg/dL — AB (ref 0.40–1.50)
Calcium: 9.6 mg/dL (ref 8.4–10.5)
Chloride: 108 mEq/L (ref 96–112)
GFR: 45.49 mL/min — ABNORMAL LOW (ref >60.00)
Glucose, Bld: 142 mg/dL — ABNORMAL HIGH (ref 70–99)
Potassium: 4.6 mEq/L (ref 3.5–5.1)
Sodium: 144 mEq/L (ref 135–145)

## 2017-09-07 LAB — HEMOGLOBIN A1C: Hgb A1c MFr Bld: 7.9 % — ABNORMAL HIGH (ref 4.6–6.5)

## 2017-09-07 LAB — IBC PANEL
Iron: 57 ug/dL (ref 42–165)
SATURATION RATIOS: 16.5 % — AB (ref 20.0–50.0)
TRANSFERRIN: 247 mg/dL (ref 212.0–360.0)

## 2017-09-14 ENCOUNTER — Ambulatory Visit: Payer: Medicare Other | Admitting: Family Medicine

## 2017-09-14 ENCOUNTER — Encounter: Payer: Self-pay | Admitting: Family Medicine

## 2017-09-14 VITALS — BP 148/70 | HR 81 | Temp 97.5°F | Ht 69.0 in | Wt 208.5 lb

## 2017-09-14 DIAGNOSIS — E1122 Type 2 diabetes mellitus with diabetic chronic kidney disease: Secondary | ICD-10-CM | POA: Diagnosis not present

## 2017-09-14 DIAGNOSIS — D508 Other iron deficiency anemias: Secondary | ICD-10-CM

## 2017-09-14 DIAGNOSIS — N183 Chronic kidney disease, stage 3 unspecified: Secondary | ICD-10-CM

## 2017-09-14 MED ORDER — BASAGLAR KWIKPEN 100 UNIT/ML ~~LOC~~ SOPN
15.0000 [IU] | PEN_INJECTOR | Freq: Every day | SUBCUTANEOUS | Status: DC
Start: 2017-09-14 — End: 2018-04-15

## 2017-09-14 NOTE — Progress Notes (Signed)
Diabetes:  Using medications without difficulties:yes, see below Hypoglycemic episodes:no Hyperglycemic episodes:no Feet problems: no swelling but still with some occ tingling at baseline.   Blood Sugars averaging: ~150 in the AM eye exam within last year: yes He has been back on 15 units with improved sugars.  Labs d/w pt.  A1c reasonable.   Diet d/w pt, re: watching sweets.    CKD.  Cr improved from last year.  D/w pt.  Still on ARB.  BP had been higher at home and I question if his cuff is calibrated, d/w pt.  He can take his cuff to next visit.  No CP, not SOB.    Still on amoxil at baseline for prev injection, no ADE on med.  His L knee isn't back to normal but is clearly better while on abx.  The plan was to continue as is, d/w pt.    Anemia, on iron, no ADE on med, taking 5 days a week.  Still on anticoagulation.  With black stools from iron.  D/w pt that he is likely better off with continued iron replacement with ongoing anticoagulation.    Recheck pulse ox 96%, d/w pt.  Normal.   Meds, vitals, and allergies reviewed.   ROS: Per HPI unless specifically indicated in ROS section   GEN: nad, alert and oriented HEENT: mucous membranes moist NECK: supple w/o LA CV: rrr. PULM: ctab, no inc wob ABD: soft, +bs EXT: no edema

## 2017-09-14 NOTE — Assessment & Plan Note (Signed)
Still on iron, no ADE on med, taking 5 days a week.  Still on anticoagulation.  With black stools from iron.  D/w pt that he is likely better off with continued iron replacement with ongoing anticoagulation and avoiding colonoscopy at this point.  He agrees.  We can recheck periodically.

## 2017-09-14 NOTE — Assessment & Plan Note (Signed)
Cr improved from last year.  D/w pt.  Still on ARB.  BP had been higher at home and I question if his cuff is calibrated, d/w pt.  He can take his cuff to next visit.  No CP, not SOB.

## 2017-09-14 NOTE — Assessment & Plan Note (Signed)
He has been back on 15 units with improved sugars.  Labs d/w pt.  A1c reasonable.   Diet d/w pt, re: watching sweets.   Continue as is.  Recheck periodically.

## 2017-09-14 NOTE — Patient Instructions (Addendum)
Recheck labs before a visit in about 4 months.  Update me as needed.  Continue as is.   Take your BP cuff to your next visit to calibrate it.   Take care.  Glad to see you.

## 2017-09-29 ENCOUNTER — Other Ambulatory Visit: Payer: Self-pay | Admitting: *Deleted

## 2017-09-29 MED ORDER — OMEPRAZOLE 40 MG PO CPDR: 40.0000 mg | DELAYED_RELEASE_CAPSULE | Freq: Every day | ORAL | 3 refills | Status: DC

## 2017-10-26 ENCOUNTER — Ambulatory Visit: Payer: Medicare Other | Admitting: Internal Medicine

## 2017-10-26 ENCOUNTER — Encounter: Payer: Self-pay | Admitting: Internal Medicine

## 2017-10-26 VITALS — BP 142/72 | HR 70 | Ht 69.0 in | Wt 206.0 lb

## 2017-10-26 DIAGNOSIS — I480 Paroxysmal atrial fibrillation: Secondary | ICD-10-CM | POA: Diagnosis not present

## 2017-10-26 DIAGNOSIS — I5032 Chronic diastolic (congestive) heart failure: Secondary | ICD-10-CM

## 2017-10-26 DIAGNOSIS — Z950 Presence of cardiac pacemaker: Secondary | ICD-10-CM

## 2017-10-26 DIAGNOSIS — I442 Atrioventricular block, complete: Secondary | ICD-10-CM

## 2017-10-26 LAB — CUP PACEART INCLINIC DEVICE CHECK
Battery Remaining Longevity: 106 mo
Brady Statistic RA Percent Paced: 7.5 %
Brady Statistic RV Percent Paced: 99.86 %
Date Time Interrogation Session: 20190604103255
Implantable Lead Location: 753859
Implantable Lead Location: 753860
Implantable Pulse Generator Implant Date: 20160321
Lead Channel Impedance Value: 450 Ohm
Lead Channel Pacing Threshold Amplitude: 0.75 V
Lead Channel Pacing Threshold Amplitude: 1.75 V
Lead Channel Pacing Threshold Pulse Width: 0.5 ms
Lead Channel Pacing Threshold Pulse Width: 0.8 ms
Lead Channel Pacing Threshold Pulse Width: 0.8 ms
Lead Channel Setting Pacing Amplitude: 2.5 V
Lead Channel Setting Pacing Pulse Width: 0.5 ms
Lead Channel Setting Sensing Sensitivity: 8 mV
MDC IDC LEAD IMPLANT DT: 20160321
MDC IDC LEAD IMPLANT DT: 20160321
MDC IDC MSMT BATTERY VOLTAGE: 3.01 V
MDC IDC MSMT LEADCHNL RA IMPEDANCE VALUE: 425 Ohm
MDC IDC MSMT LEADCHNL RA PACING THRESHOLD AMPLITUDE: 1.75 V
MDC IDC MSMT LEADCHNL RA SENSING INTR AMPL: 2.6 mV
MDC IDC MSMT LEADCHNL RV PACING THRESHOLD AMPLITUDE: 0.75 V
MDC IDC MSMT LEADCHNL RV PACING THRESHOLD PULSEWIDTH: 0.5 ms
MDC IDC MSMT LEADCHNL RV SENSING INTR AMPL: 10.1 mV
MDC IDC SET LEADCHNL RA PACING AMPLITUDE: 3 V
Pulse Gen Model: 2240
Pulse Gen Serial Number: 7734710

## 2017-10-26 NOTE — Patient Instructions (Signed)
Medication Instructions:  Your physician recommends that you continue on your current medications as directed. Please refer to the Current Medication list given to you today.  Labwork: None ordered.  Testing/Procedures: None ordered.  Follow-Up: Your physician wants you to follow-up in: one year with Dr. Lovena Le.   You will receive a reminder letter in the mail two months in advance. If you don't receive a letter, please call our office to schedule the follow-up appointment.  Remote monitoring is used to monitor your Pacemaker from home. This monitoring reduces the number of office visits required to check your device to one time per year. It allows [place] to keep an eye on the functioning of your device to ensure it is working properly. You are scheduled for a device check from home on 11/01/2017. You may send your transmission at any time that day. If you have a wireless device, the transmission will be sent automatically. After your physician reviews your transmission, you will receive a postcard with your next transmission date.  Any Other Special Instructions Will Be Listed Below (If Applicable).  If you need a refill on your cardiac medications before your next appointment, please call your pharmacy.

## 2017-10-26 NOTE — Progress Notes (Signed)
HPI [name] returns today for followup. He is a pleasant 84 yo man with a h/o diastolic heart failure, complete heart block, s/p PPM insertion. He remains active despite several medical problems and an advanced age. He denies chest pain or sob. He has had a problem with his prosthetic knee and is on suppressive anti-biotics. He has occaisional dizzy spells but notes that his blood pressure is usually high/normal with these. No fevers/chills/night sweats. No edema.   Allergies  Allergen Reactions  . Zoledronic Acid Other (See Comments)    Stopped 2015 due to Creatine  Other reaction(s): Unknown Stopped 2015 due to Cr     Current Outpatient Medications  Medication Sig Dispense Refill  . AMITIZA 8 MCG capsule TAKE ONE CAPSULE BY MOUTH EACH MORNING 90 capsule 1  . amLODipine (NORVASC) 2.5 MG tablet Take 1 tablet (2.5 mg total) by mouth daily. 90 tablet 3  . amoxicillin (AMOXIL) 500 MG capsule Take 1 capsule (500 mg total) by mouth 2 (two) times daily. 180 capsule 3  . diltiazem (TIAZAC) 360 MG 24 hr capsule TAKE ONE CAPSULE BY MOUTH DAILY 90 capsule 1  . ELIQUIS 2.5 MG TABS tablet TAKE 1 TABLET BY MOUTH TWICE A DAY 180 tablet 3  . ferrous sulfate 325 (65 FE) MG tablet TAKE 1 TABLET BY MOUTH 5 DAYS A WEEK    . glipiZIDE (GLUCOTROL) 5 MG tablet TAKE 2 TABLETS BY MOUTH EVERY MORNING BEFORE BREAKFAST AND 1 TABLET BEFORE SUPPER 270 tablet 0  . Insulin Glargine (BASAGLAR KWIKPEN) 100 UNIT/ML SOPN Inject 0.15 mLs (15 Units total) into the skin at bedtime.    [name] JANUVIA 50 MG tablet TAKE 1 TABLET BY MOUTH DAILY 90 tablet 1  . LEADER UNIFINE PENTIPS 31G X 5 MM MISC USE DAILY WITH INSULIN PEN 100 each 3  . losartan (COZAAR) 50 MG tablet Take 50 mg by mouth daily.    [name] omeprazole (PRILOSEC) 40 MG capsule Take 1 capsule (40 mg total) by mouth daily. 90 capsule 3  . Ophthalmic Irrigation Solution (EYE WASH) 99.05 % SOLN Place 1 drop into both eyes 2 (two) times daily.    . rosuvastatin (CRESTOR)  20 MG tablet Take 1 tablet (20 mg total) by mouth daily. 90 tablet 2   No current facility-administered medications for this visit.      Past Medical History:  Diagnosis Date  . Anemia   . Arthritis   . CAD (coronary artery disease)    a. CABG 1989, b. Myoview low risk 2012.  . Carotid artery occlusion    a. Duplex 10/2014: patent R/L CEA with mild hyperplasia in right surgical bulb - followed by vascular.  . CHB (complete heart block) [facility]) March 2016   a. s/p STJ dual chamber pacemaker 07/2014.  [name] Chronic diastolic CHF (congestive heart failure) ([place])    a. Dx 07/5699 - acute diastolic CHF in the setting of CHB.  [name] Chronic kidney disease, stage IV (severe) (HCC)    stage III/IV as of 2015, [name]  . CKD (chronic kidney disease), stage IV ([place])   . Colon polyps   . Complication of anesthesia   . Constipation   . CVA (cerebral infarction)   . Diabetes mellitus    type II  . Diverticulosis   . Dupuytren's disease    finger right hand contracted  . Essential hypertension   . GERD (gastroesophageal reflux disease)   . Hemorrhoids   . Hyperlipidemia   .  Myocardial infarction [facility]) 1976  and Mar. 19, 2016  . Orthostasis   . Osteoporosis   . PAF (paroxysmal atrial fibrillation) [facility]) March 2016  . PONV (postoperative nausea and vomiting)   . Presence of permanent cardiac pacemaker   . Shortness of breath dyspnea    due to pain    ROS:   All systems reviewed and negative except as noted in the HPI.   Past Surgical History:  Procedure Laterality Date  . CARDIAC CATHETERIZATION  08/11/2014   Procedure: TEMPORARY PACEMAKER;  Surgeon: [name], MD;  Location: [facility] CATH LAB;  Service: Cardiovascular;;  . CAROTID ENDARTERECTOMY  12/31/10   Right  . CAROTID ENDARTERECTOMY  02/09/11   left  . CHOLECYSTECTOMY    . CORONARY ARTERY BYPASS GRAFT  1989  . EYE SURGERY Bilateral    Cataract with implants  . I&D KNEE WITH POLY EXCHANGE Left 07/29/2015   Procedure:  IRRIGATION AND DEBRIDEMENT LEFT KNEE WITH POLY EXCHANGE;  Surgeon: Rod Can, MD;  Location: [place];  Service: Orthopedics;  Laterality: Left;  . IR GENERIC HISTORICAL  03/04/2016   IR US GUIDE VASC ACCESS RIGHT 03/04/2016 Marybelle Killings, MD [facility]-INTERV RAD  . IR GENERIC HISTORICAL  03/04/2016   IR FLUORO GUIDE CV LINE RIGHT 03/04/2016 Marybelle Killings, MD [facility]INTER[facility] RAD  . JOINT REPLACEMENT     bilat. knees  . PERMANENT PACEMAKER INSERTION N/A 08/13/2014   STJ dual chamber pacemaker implanted by [name] for CHB  . TONSILLECTOMY    . TOTAL KNEE ARTHROPLASTY     bilateral     Family History  Problem Relation Age of Onset  . Heart disease Mother        Before age 84  . Diabetes Mother   . Varicose Veins Mother   . Heart attack Mother   . Heart attack Father   . Heart disease Father        After age 15  . Hypertension Father   . Heart disease Brother        Before age 4  . Hypertension Brother   . Heart attack Brother   . Diabetes Son   . Hypertension Son   . Hypertension Son   . Diabetes Son   . Diabetes Unknown   . Cancer Unknown   . Colon cancer Neg Hx      Social History   Socioeconomic History  . Marital status: Married    Spouse name: Not on file  . Number of children: Not on file  . Years of education: Not on file  . Highest education level: Not on file  Occupational History  . Occupation: retired    Fish farm manager: RETIRED  Social Needs  . Financial resource strain: Not on file  . Food insecurity:    Worry: Not on file    Inability: Not on file  . Transportation needs:    Medical: Not on file    Non-medical: Not on file  Tobacco Use  . Smoking status: Former Smoker    Packs/day: 1.00    Years: 25.00    Pack years: 25.00    Types: Cigarettes    Last attempt to quit: 01/21/1973    Years since quitting: 44.7  . Smokeless tobacco: Never Used  . Tobacco comment: began smoking in high school, quit age 69  Substance and Sexual Activity  . Alcohol use: Yes     Alcohol/week: 0.0 oz    Comment: 1-2 per week  . Drug use: No  .  Sexual activity: Never  Lifestyle  . Physical activity:    Days per week: Not on file    Minutes per session: Not on file  . Stress: Not on file  Relationships  . Social connections:    Talks on phone: Not on file    Gets together: Not on file    Attends religious service: Not on file    Active member of club or organization: Not on file    Attends meetings of clubs or organizations: Not on file    Relationship status: Not on file  . Intimate partner violence:    Fear of current or ex partner: Not on file    Emotionally abused: Not on file    Physically abused: Not on file    Forced sexual activity: Not on file  Other Topics Concern  . 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  Remarried 1998.     3 sons     Ht 5\' 9"  (1.753 m)   BMI 30.79 kg/m   Physical Exam:  Well appearing elderly man, looks older than his stated age, NAD HEENT: Unremarkable Neck:  6 cm JVD, no thyromegally Lymphatics:  No adenopathy Back:  No CVA tenderness Lungs:  Clear with no wheezes HEART:  Regular rate rhythm, no murmurs, no rubs, no clicks Abd:  soft, positive bowel sounds, no organomegally, no rebound, no guarding Ext:  2 plus pulses, no edema, no cyanosis, no clubbing Skin:  No rashes no nodules Neuro:  CN II through XII intact, motor grossly intact  EKG - NSR with p synchronous ventricular pacing  DEVICE  Normal device function.  See PaceArt for details.   Assess/Plan: 1. CHB - he is s/p PPM and is asymptomatic. 2. PPM - his St. Jude DDD PM is working normally. Will recheck in several months. 3. Knee infection - he will continue to take suppressive anti-biotics. No evidence of active infection at this time. 4. HTN - his systolic blood pressure is well controlled. He will continue his current meds. 5. Dizziness - this is not associated with  low bp. He will undergo watchful waiting.  Mikle Bosworth.D.

## 2017-10-29 ENCOUNTER — Other Ambulatory Visit: Payer: Self-pay | Admitting: Cardiology

## 2017-10-29 DIAGNOSIS — I480 Paroxysmal atrial fibrillation: Secondary | ICD-10-CM

## 2017-10-29 NOTE — Telephone Encounter (Signed)
Eliquis 2.5mg  refill request received; pt is 84 yrs old, wt-93.4kg, Crea-1.54 on 09/07/17, last seen by Dr. Lovena Le on 10/26/17; will send in refill to requested Pharmacy.

## 2017-11-01 ENCOUNTER — Ambulatory Visit (INDEPENDENT_AMBULATORY_CARE_PROVIDER_SITE_OTHER): Payer: Medicare Other | Admitting: *Deleted

## 2017-11-01 DIAGNOSIS — I442 Atrioventricular block, complete: Secondary | ICD-10-CM | POA: Diagnosis not present

## 2017-11-01 NOTE — Progress Notes (Signed)
Remote pacemaker transmission.   

## 2017-11-02 ENCOUNTER — Encounter: Payer: Self-pay | Admitting: Cardiology

## 2017-11-04 ENCOUNTER — Other Ambulatory Visit: Payer: Self-pay | Admitting: Family Medicine

## 2017-11-11 ENCOUNTER — Other Ambulatory Visit: Payer: Self-pay

## 2017-11-11 ENCOUNTER — Ambulatory Visit (INDEPENDENT_AMBULATORY_CARE_PROVIDER_SITE_OTHER): Payer: Medicare Other | Admitting: Family

## 2017-11-11 ENCOUNTER — Encounter: Payer: Self-pay | Admitting: Family

## 2017-11-11 ENCOUNTER — Ambulatory Visit (INDEPENDENT_AMBULATORY_CARE_PROVIDER_SITE_OTHER)
Admission: RE | Admit: 2017-11-11 | Discharge: 2017-11-11 | Disposition: A | Discharge status: Home / Self Care | Payer: Medicare Other | Source: Ambulatory Visit | Attending: Vascular Surgery | Admitting: Vascular Surgery

## 2017-11-11 ENCOUNTER — Ambulatory Visit (HOSPITAL_COMMUNITY)
Admission: RE | Admit: 2017-11-11 | Discharge: 2017-11-11 | Disposition: A | Discharge status: Home / Self Care | Payer: Medicare Other | Source: Ambulatory Visit | Attending: Vascular Surgery | Admitting: Vascular Surgery

## 2017-11-11 VITALS — BP 165/81 | HR 67 | Resp 20 | Ht 69.0 in | Wt 204.4 lb

## 2017-11-11 DIAGNOSIS — I6523 Occlusion and stenosis of bilateral carotid arteries: Secondary | ICD-10-CM

## 2017-11-11 DIAGNOSIS — R0989 Other specified symptoms and signs involving the circulatory and respiratory systems: Secondary | ICD-10-CM | POA: Insufficient documentation

## 2017-11-11 DIAGNOSIS — Z9889 Other specified postprocedural states: Secondary | ICD-10-CM

## 2017-11-11 DIAGNOSIS — I10 Essential (primary) hypertension: Secondary | ICD-10-CM | POA: Diagnosis not present

## 2017-11-11 DIAGNOSIS — Z09 Encounter for follow-up examination after completed treatment for conditions other than malignant neoplasm: Secondary | ICD-10-CM | POA: Insufficient documentation

## 2017-11-11 DIAGNOSIS — Z87891 Personal history of nicotine dependence: Secondary | ICD-10-CM | POA: Diagnosis not present

## 2017-11-11 DIAGNOSIS — E1151 Type 2 diabetes mellitus with diabetic peripheral angiopathy without gangrene: Secondary | ICD-10-CM | POA: Diagnosis not present

## 2017-11-11 NOTE — Progress Notes (Signed)
Chief Complaint: Follow up Extracranial Carotid Artery Stenosis   History of Present Illness  Dean Murphy is a 84 y.o. male who is s/p bilateral carotid endarterectomy in 2012 by Dr. Oneida Alar.  He denies any new neurologic events including amaurosis, numbness, or weakness.   He is physically active.  He does not seem to have claudication symptoms with walking, denies non healing wounds. He does have a tired feeling in his low back and hips about noon every day, then takes a nap. He does yard work in the morning.   He had a staph infection in his left knee in March 2017, had I&D of this, was on IV antibx for several weeks, he continues po antibx.  Pt states his PCP is aware that his blood pressure runs about what it is today.  Pt states he sees Dr. Candiss Norse, nephrologist in [place], and pt states his nephrologist is also aware of his blood pressure.   He had an MI March 2016, had pacemaker inserted at that time. He had one previous MI in 1976, 5 vessel CABG in 1989.   Pt reports tingling in the toes of both feet.  The patient reports history of TIA before the 2012 CEA's , amaurosis fugax, in his left eye that lasted about 5 minutes; this occurred before his first CEA. The patient denies a history of unilateral facial drooping, denies a history of hemiplegia, and denies a history of receptive or expressive aphasia.   Diabetic: yes, A1C on 09-07-17 was 7.9, uncontrolled, serum creatinine on that date was 1.54, GFR was 45 (stage 3A CKD) Tobaccos use: former smoker, quit about 1974, he smoked x 15 years  Pt meds include: Statin : yes ASA: no Other anticoagulants/antiplatelets: Elliquis since pacemaker insertion, stopped for a short period when he had GI bleeding    Past Medical History:  Diagnosis Date  . Anemia   . Arthritis   . CAD (coronary artery disease)    a. CABG 1989, b. Myoview low risk 2012.  . Carotid artery occlusion    a. Duplex 10/2014: patent R/L CEA  with mild hyperplasia in right surgical bulb - followed by vascular.  . CHB (complete heart block) [facility]) March 2016   a. s/p STJ dual chamber pacemaker 07/2014.  [name] Chronic diastolic CHF (congestive heart failure) ([place])    a. Dx 06/5850 - acute diastolic CHF in the setting of CHB.  [name] Chronic kidney disease, stage IV (severe) (HCC)    stage III/IV as of 2015, [name]  . CKD (chronic kidney disease), stage IV ([place])   . Colon polyps   . Complication of anesthesia   . Constipation   . CVA (cerebral infarction)   . Diabetes mellitus    type II  . Diverticulosis   . Dupuytren's disease    finger right hand contracted  . Essential hypertension   . GERD (gastroesophageal reflux disease)   . Hemorrhoids   . Hyperlipidemia   . Myocardial infarction [facility]) 1976  and Mar. 19, 2016  . Orthostasis   . Osteoporosis   . PAF (paroxysmal atrial fibrillation) [facility]) March 2016  . PONV (postoperative nausea and vomiting)   . Presence of permanent cardiac pacemaker   . Shortness of breath dyspnea    due to pain    Social History Social History   Tobacco Use  . Smoking status: Former Smoker    Packs/day: 1.00    Years: 25.00    Pack years: 25.00  Types: Cigarettes    Last attempt to quit: 01/21/1973    Years since quitting: 44.8  . Smokeless tobacco: Never Used  . Tobacco comment: began smoking in high school, quit age 75  Substance Use Topics  . Alcohol use: Yes    Alcohol/week: 0.0 oz    Comment: 1-2 per week  . Drug use: No    Family History Family History  Problem Relation Age of Onset  . Heart disease Mother        Before age 16  . Diabetes Mother   . Varicose Veins Mother   . Heart attack Mother   . Heart attack Father   . Heart disease Father        After age 78  . Hypertension Father   . Heart disease Brother        Before age 35  . Hypertension Brother   . Heart attack Brother   . Diabetes Son   . Hypertension Son   . Hypertension Son   . Diabetes Son    . Diabetes Unknown   . Cancer Unknown   . Colon cancer Neg Hx     Surgical History Past Surgical History:  Procedure Laterality Date  . CARDIAC CATHETERIZATION  08/11/2014   Procedure: TEMPORARY PACEMAKER;  Surgeon: [name], MD;  Location: [facility] CATH LAB;  Service: Cardiovascular;;  . CAROTID ENDARTERECTOMY  12/31/10   Right  . CAROTID ENDARTERECTOMY  02/09/11   left  . CHOLECYSTECTOMY    . CORONARY ARTERY BYPASS GRAFT  1989  . EYE SURGERY Bilateral    Cataract with implants  . I&D KNEE WITH POLY EXCHANGE Left 07/29/2015   Procedure: IRRIGATION AND DEBRIDEMENT LEFT KNEE WITH POLY EXCHANGE;  Surgeon: Rod Can, MD;  Location: [place];  Service: Orthopedics;  Laterality: Left;  . IR GENERIC HISTORICAL  03/04/2016   IR US GUIDE VASC ACCESS RIGHT 03/04/2016 Marybelle Killings, MD [facility]INTER[facility] RAD  . IR GENERIC HISTORICAL  03/04/2016   IR FLUORO GUIDE CV LINE RIGHT 03/04/2016 Marybelle Killings, MD [facility]INTER[facility]  . JOINT REPLACEMENT     bilat. knees  . PERMANENT PACEMAKER INSERTION N/A 08/13/2014   STJ dual chamber pacemaker implanted by [name] for CHB  . TONSILLECTOMY    . TOTAL KNEE ARTHROPLASTY     bilateral    Allergies  Allergen Reactions  . Zoledronic Acid Other (See Comments)    Stopped 2015 due to Creatine  Other reaction(s): Unknown Stopped 2015 due to Cr    Current Outpatient Medications  Medication Sig Dispense Refill  . AMITIZA 8 MCG capsule TAKE ONE CAPSULE BY MOUTH EACH MORNING 90 capsule 1  . amLODipine (NORVASC) 2.5 MG tablet Take 1 tablet (2.5 mg total) by mouth daily. 90 tablet 3  . amoxicillin (AMOXIL) 500 MG capsule Take 1 capsule (500 mg total) by mouth 2 (two) times daily. 180 capsule 3  . diltiazem (TIAZAC) 360 MG 24 hr capsule TAKE ONE CAPSULE BY MOUTH DAILY 90 capsule 1  . ELIQUIS 2.5 MG TABS tablet TAKE 1 TABLET BY MOUTH TWICE A DAY 180 tablet 2  . ferrous sulfate 325 (65 FE) MG tablet TAKE 1 TABLET BY MOUTH 5 DAYS A WEEK    . glipiZIDE (GLUCOTROL) 5 MG  tablet TAKE 2 TABLETS BY MOUTH EVERY MORNING BEFORE BREAKFAST AND 1 TABLET BEFORE SUPPER 270 tablet 2  . Insulin Glargine (BASAGLAR KWIKPEN) 100 UNIT/ML SOPN Inject 0.15 mLs (15 Units total) into the skin at bedtime.    [name]  JANUVIA 50 MG tablet TAKE 1 TABLET BY MOUTH DAILY 90 tablet 1  . LEADER UNIFINE PENTIPS 31G X 5 MM MISC USE DAILY WITH INSULIN PEN 100 each 3  . losartan (COZAAR) 50 MG tablet Take 50 mg by mouth daily.    [name] omeprazole (PRILOSEC) 40 MG capsule Take 1 capsule (40 mg total) by mouth daily. 90 capsule 3  . Ophthalmic Irrigation Solution (EYE WASH) 99.05 % SOLN Place 1 drop into both eyes 2 (two) times daily.    . rosuvastatin (CRESTOR) 20 MG tablet Take 1 tablet (20 mg total) by mouth daily. 90 tablet 2   No current facility-administered medications for this visit.     Review of Systems : See HPI for pertinent positives and negatives.  Physical Examination  Vitals:   11/11/17 1512 11/11/17 1513  BP: (!) 166/79 (!) 165/81  Pulse: 67   Resp: 20   SpO2: 94%   Weight: 204 lb 6.4 oz (92.7 kg)   Height: 5\' 9"  (1.753 m)    Body mass index is 30.18 kg/m.  General: WDWN obese male in NAD GAIT:normal HENT: no gross abnormalities.  Eyes: PERRLA Pulmonary: Respirations are non-labored, CTAB Cardiac: Regular rhythm, no detected murmur. Pacemaker palpated left upper chest.  VASCULAR EXAM Carotid Bruits Right Left   Positive Negative    Abdominal aortic pulse is not palpable. Radial pulses are 2+ palpable and equal.      LE Pulses Right Left   FEMORAL 1+ palpable 2+ palpable    POPLITEAL not palpable  not palpable   POSTERIOR TIBIAL faintly palpable  1+ palpable    DORSALIS PEDIS  ANTERIOR TIBIAL faintly palpable  1+ palpable     Gastrointestinal: soft, nontender, BS WNL, no r/g,  no palpated masses. Musculoskeletal: No muscle atrophy/wasting. M/S 5/5 throughout, Extremities without ischemic changes. Skin: No rashes, no ulcers, no cellulitis.   Neurologic:  A&O X 3; appropriate affect, sensation is normal; speech is normal, CN 2-12 intact, pain and light touch intact in extremities, motor exam as listed above. Psychiatric: Normal thought content, mood appropriate to clinical situation.    Assessment: Dean Murphy is a 84 y.o. male who presents s/p staged bilateral carotid endarterectomies in 2012. He had a left amaurosis fugax TIA in 2012 before his fIrst CEA; he has had no subsequent TIA's or strokes.  He has diminished pedal pulses but no symptoms of claudication with walking, no signs of ischemia in his feet or legs..   His atherosclerotic risk factors include uncontrolled hypertension, uncontrolled DM, 15 years smoking (quit in 1974), CAD, and stage 3A CKD.     DATA  Carotid Duplex (11-11-17): Patent bilateral carotid endarterectomy sites with no evidence for restenosis. Bilateral vertebral artery flow is antegrade.  Bilateral subclavian artery waveforms are normal.  No significant change since exams on 10-25-2014, 10-31-2015, and 11-05-16.   ABI (Date: 11/11/2017):  R:   ABI: 1.19 (1.07 on 11-05-16),   PT: bi  DP: bi  TBI:  0.62 (was 0.75)  L:   ABI: 1.49 (was 1.11),   PT: tri  DP: bi  TBI: 0.69 (was 0.72)  Bilateral ABI remain normal with tri and biphasic waveforms. Left ABI may be falsely elevated.     Plan: Follow-up in 18 months with Carotid Duplex scan and ABI's. I advised pt to notify us if he develops concerns re the circulation in his feet or legs.    I discussed in depth with the patient the nature of atherosclerosis, and  emphasized the importance of maximal medical management including strict control of blood pressure, blood glucose, and lipid levels, obtaining regular exercise, and continued cessation of smoking.  The  patient is aware that without maximal medical management the underlying atherosclerotic disease process will progress, limiting the benefit of any interventions. The patient was given information about stroke prevention and what symptoms should prompt the patient to seek immediate medical care. Thank you for allowing [place] to participate in this patient's care.  Clemon Chambers, RN, MSN, FNP-C [facility] Office: [phone]  Clinic Physician: Oneida Alar  11/11/17 3:23 PM

## 2017-11-11 NOTE — Patient Instructions (Signed)

## 2017-11-15 LAB — CUP PACEART REMOTE DEVICE CHECK
Battery Remaining Longevity: 89 mo
Battery Voltage: 3.01 V
Brady Statistic AS VP Percent: 92 %
Brady Statistic RA Percent Paced: 7.4 %
Brady Statistic RV Percent Paced: 99 %
Date Time Interrogation Session: 20190610060015
Implantable Lead Implant Date: 20160321
Implantable Lead Location: 753859
Implantable Lead Location: 753860
Implantable Pulse Generator Implant Date: 20160321
Lead Channel Impedance Value: 390 Ohm
Lead Channel Impedance Value: 410 Ohm
Lead Channel Pacing Threshold Pulse Width: 0.8 ms
Lead Channel Sensing Intrinsic Amplitude: 10.1 mV
Lead Channel Setting Pacing Pulse Width: 0.5 ms
Lead Channel Setting Sensing Sensitivity: 8 mV
MDC IDC LEAD IMPLANT DT: 20160321
MDC IDC MSMT BATTERY REMAINING PERCENTAGE: 95.5 %
MDC IDC MSMT LEADCHNL RA PACING THRESHOLD AMPLITUDE: 1.75 V
MDC IDC MSMT LEADCHNL RA SENSING INTR AMPL: 2.1 mV
MDC IDC MSMT LEADCHNL RV PACING THRESHOLD AMPLITUDE: 0.75 V
MDC IDC MSMT LEADCHNL RV PACING THRESHOLD PULSEWIDTH: 0.5 ms
MDC IDC SET LEADCHNL RA PACING AMPLITUDE: 3 V
MDC IDC SET LEADCHNL RV PACING AMPLITUDE: 2.5 V
MDC IDC STAT BRADY AP VP PERCENT: 7.6 %
MDC IDC STAT BRADY AP VS PERCENT: 1 %
MDC IDC STAT BRADY AS VS PERCENT: 1 %
Pulse Gen Model: 2240
Pulse Gen Serial Number: 7734710

## 2017-11-18 ENCOUNTER — Other Ambulatory Visit: Payer: Self-pay | Admitting: Family Medicine

## 2017-11-19 ENCOUNTER — Other Ambulatory Visit: Payer: Self-pay | Admitting: *Deleted

## 2017-11-19 NOTE — Telephone Encounter (Signed)
Faxed refill request. Amitiza Last office visit:   09/14/17 Last Filled:    90 capsule 1 05/10/2017  Please advise.

## 2017-11-21 MED ORDER — LUBIPROSTONE 8 MCG PO CAPS: ORAL_CAPSULE | ORAL | 1 refills | Status: DC

## 2017-11-21 NOTE — Telephone Encounter (Signed)
Sent. Thanks.   

## 2017-11-27 ENCOUNTER — Other Ambulatory Visit: Payer: Self-pay | Admitting: Cardiology

## 2017-11-27 DIAGNOSIS — I10 Essential (primary) hypertension: Secondary | ICD-10-CM

## 2017-11-27 DIAGNOSIS — E785 Hyperlipidemia, unspecified: Secondary | ICD-10-CM

## 2017-12-03 LAB — LAB REPORT - SCANNED
Creatinine, Ser: 1.86
Glucose: 206
HEMOGLOBIN: 14.7
PTH: 70

## 2017-12-13 ENCOUNTER — Telehealth: Payer: Self-pay | Admitting: Family Medicine

## 2017-12-13 ENCOUNTER — Ambulatory Visit (HOSPITAL_COMMUNITY)
Admission: EM | Admit: 2017-12-13 | Discharge: 2017-12-13 | Disposition: A | Discharge status: Home / Self Care | Payer: Medicare Other | Attending: Family Medicine | Admitting: Family Medicine

## 2017-12-13 ENCOUNTER — Encounter (HOSPITAL_COMMUNITY): Payer: Self-pay

## 2017-12-13 DIAGNOSIS — M79605 Pain in left leg: Secondary | ICD-10-CM | POA: Diagnosis not present

## 2017-12-13 DIAGNOSIS — S8012XA Contusion of left lower leg, initial encounter: Secondary | ICD-10-CM

## 2017-12-13 DIAGNOSIS — W268XXA Contact with other sharp object(s), not elsewhere classified, initial encounter: Secondary | ICD-10-CM

## 2017-12-13 DIAGNOSIS — L03116 Cellulitis of left lower limb: Secondary | ICD-10-CM

## 2017-12-13 DIAGNOSIS — T148XXA Other injury of unspecified body region, initial encounter: Secondary | ICD-10-CM

## 2017-12-13 MED ORDER — DOXYCYCLINE HYCLATE 100 MG PO CAPS: 100.0000 mg | ORAL_CAPSULE | Freq: Two times a day (BID) | ORAL | 0 refills | Status: AC

## 2017-12-13 NOTE — Discharge Instructions (Signed)
Please start additional antibiotic. I am worried this is a hematoma (large bruise) which has become infected. Please follow up with your primary care provider in the next 3-5 days for recheck as if no improvement with antibiotics you may need further intervention such as evacuation of this area.  If you develop worsening of pain, fevers, swelling or otherwise worsening please return sooner.

## 2017-12-13 NOTE — ED Provider Notes (Signed)
[facility]    CSN: [id number] Arrival date & time: 12/13/17  0935     History   Chief Complaint Chief Complaint  Patient presents with  . Leg Pain    Left : from insect bite     HPI Dean Murphy is a 84 y.o. male.   Dean Murphy presents with complaints of pain and swelling to left shin. States he did bump it on his mower prior to onset of swelling, this occurred 7/13. Localized swelling has improved but still with some generalized swelling as well as soreness and itching sensation. Noted old bruising to medial ankle although no ankle pain. No drainage from affected area. Had tried to squeeze to allow for drainage. No fevers. He is on eliquis and on maintenance amoxicillin as he had a left TKA which became infected and required clean out. Hx oc CABG, heart block, kidney disease, CVA, diabetes, a fib.     ROS per HPI.      Past Medical History:  Diagnosis Date  . Anemia   . Arthritis   . CAD (coronary artery disease)    a. CABG 1989, b. Myoview low risk 2012.  . Carotid artery occlusion    a. Duplex 10/2014: patent R/L CEA with mild hyperplasia in right surgical bulb - followed by vascular.  . CHB (complete heart block) [facility]) March 2016   a. s/p STJ dual chamber pacemaker 07/2014.  [name] Chronic diastolic CHF (congestive heart failure) ([place])    a. Dx 01/6758 - acute diastolic CHF in the setting of CHB.  [name] Chronic kidney disease, stage IV (severe) (HCC)    stage III/IV as of 2015, [name]  . CKD (chronic kidney disease), stage IV ([place])   . Colon polyps   . Complication of anesthesia   . Constipation   . CVA (cerebral infarction)   . Diabetes mellitus    type II  . Diverticulosis   . Dupuytren's disease    finger right hand contracted  . Essential hypertension   . GERD (gastroesophageal reflux disease)   . Hemorrhoids   . Hyperlipidemia   . Myocardial infarction [facility]) 1976  and Mar. 19, 2016  . Orthostasis   . Osteoporosis   . PAF (paroxysmal atrial  fibrillation) [facility]) March 2016  . PONV (postoperative nausea and vomiting)   . Presence of permanent cardiac pacemaker   . Shortness of breath dyspnea    due to pain    Patient Active Problem List   Diagnosis Date Noted  . Infection of prosthetic left knee joint ([place]) 07/29/2015  . Coronary artery disease 07/29/2015  . Stroke ([place]) 12/24/2014  . Double vision 12/23/2014  . Pacemaker 11/16/2014  . Anemia 09/27/2014  . Gastrointestinal hemorrhage associated with peptic ulcer 09/07/2014  . Atrial fibrillation-CHADS VASC2 = 5 (age, HTN, DM, Vasc) 08/13/2014  . Chest pain 08/11/2014  . Complete heart block ([place]) 08/11/2014  . S/P CABG x 5 1989. Low risk Myoview 2012 08/11/2014  . Syncope 08/11/2014  . Cough 06/14/2014  . Left foot pain 02/23/2014  . Gouty arthropathy 08/09/2013  . Chronic radicular lumbar pain 12/15/2012  . PVD- s/p bilat CEA- CA dopplers OK Jan 2015 10/06/2012  . Insomnia 09/30/2011  . OTHER SPECIFIED SITES OF SPRAINS AND STRAINS 04/25/2010  . DIVERTICULITIS OF COLON 02/21/2010  . TEMPOROMANDIBULAR JOINT DISORDER 11/22/2009  . DM (diabetes mellitus), type 2 with renal complications ([place]) 16/38/4665  . Chronic kidney disease, stage III (moderate) ([place]) 07/26/2008  . CHRONIC  PROSTATITIS 07/26/2008  . ADVEF, DRUG/MEDICINAL/BIOLOGICAL SUBST NOS 03/16/2007  . Dyslipidemia 11/15/2006  . Essential hypertension 11/15/2006  . GERD 11/15/2006  . Osteoarthritis 11/15/2006    Past Surgical History:  Procedure Laterality Date  . CARDIAC CATHETERIZATION  08/11/2014   Procedure: TEMPORARY PACEMAKER;  Surgeon: [name], MD;  Location: [facility] CATH LAB;  Service: Cardiovascular;;  . CAROTID ENDARTERECTOMY  12/31/10   Right  . CAROTID ENDARTERECTOMY  02/09/11   left  . CHOLECYSTECTOMY    . CORONARY ARTERY BYPASS GRAFT  1989  . EYE SURGERY Bilateral    Cataract with implants  . I&D KNEE WITH POLY EXCHANGE Left 07/29/2015   Procedure: IRRIGATION AND DEBRIDEMENT LEFT KNEE  WITH POLY EXCHANGE;  Surgeon: Rod Can, MD;  Location: [place];  Service: Orthopedics;  Laterality: Left;  . IR GENERIC HISTORICAL  03/04/2016   IR US GUIDE VASC ACCESS RIGHT 03/04/2016 Marybelle Killings, MD [facility]INTER[facility]  . IR GENERIC HISTORICAL  03/04/2016   IR FLUORO GUIDE CV LINE RIGHT 03/04/2016 Marybelle Killings, MD [facility]INTER[facility]  . JOINT REPLACEMENT     bilat. knees  . PERMANENT PACEMAKER INSERTION N/A 08/13/2014   STJ dual chamber pacemaker implanted by [name] for CHB  . TONSILLECTOMY    . TOTAL KNEE ARTHROPLASTY     bilateral       Home Medications    Prior to Admission medications   Medication Sig Start Date End Date Taking? Authorizing Provider  amLODipine (NORVASC) 2.5 MG tablet Take 1 tablet (2.5 mg total) by mouth daily. 09/03/16   Tonia Ghent, MD  amoxicillin (AMOXIL) 500 MG capsule Take 1 capsule (500 mg total) by mouth 2 (two) times daily. 06/10/17   Michel Bickers, MD  diltiazem [facility]) 360 MG 24 hr capsule TAKE ONE CAPSULE BY MOUTH DAILY 08/31/17   Dorothy Spark, MD  doxycycline (VIBRAMYCIN) 100 MG capsule Take 1 capsule (100 mg total) by mouth 2 (two) times daily for 10 days. 12/13/17 12/23/17  Zigmund Gottron, NP  ELIQUIS 2.5 MG TABS tablet TAKE 1 TABLET BY MOUTH TWICE A DAY 10/29/17   Dorothy Spark, MD  ferrous sulfate 325 (65 FE) MG tablet TAKE 1 TABLET BY MOUTH 5 DAYS A WEEK 05/11/17   Tonia Ghent, MD  glipiZIDE (GLUCOTROL) 5 MG tablet TAKE 2 TABLETS BY MOUTH EVERY MORNING BEFORE BREAKFAST AND 1 TABLET BEFORE SUPPER 11/05/17   Tonia Ghent, MD  Insulin Glargine (BASAGLAR KWIKPEN) 100 UNIT/ML SOPN Inject 0.15 mLs (15 Units total) into the skin at bedtime. 09/14/17   Tonia Ghent, MD  JANUVIA 50 MG tablet TAKE 1 TABLET BY MOUTH DAILY 11/19/17   Tonia Ghent, MD  LEADER UNIFINE PENTIPS 31G X 5 MM MISC USE DAILY WITH INSULIN PEN 02/08/17   Tonia Ghent, MD  losartan (COZAAR) 50 MG tablet Take 50 mg by mouth daily.    [provider]    lubiprostone (AMITIZA) 8 MCG capsule TAKE ONE CAPSULE BY MOUTH EACH MORNING 11/21/17   Tonia Ghent, MD  omeprazole (PRILOSEC) 40 MG capsule Take 1 capsule (40 mg total) by mouth daily. 09/29/17   Tonia Ghent, MD  Ophthalmic Irrigation Solution (EYE [place]) 99.05 % SOLN Place 1 drop into both eyes 2 (two) times daily.    [provider]  rosuvastatin (CRESTOR) 20 MG tablet TAKE 1 TABLET BY MOUTH DAILY 11/30/17   Dorothy Spark, MD    Family History Family History  Problem Relation Age  of Onset  . Heart disease Mother        Before age 79  . Diabetes Mother   . Varicose Veins Mother   . Heart attack Mother   . Heart attack Father   . Heart disease Father        After age 39  . Hypertension Father   . Heart disease Brother        Before age 72  . Hypertension Brother   . Heart attack Brother   . Diabetes Son   . Hypertension Son   . Hypertension Son   . Diabetes Son   . Diabetes Unknown   . Cancer Unknown   . Colon cancer Neg Hx     Social History Social History   Tobacco Use  . Smoking status: Former Smoker    Packs/day: 1.00    Years: 25.00    Pack years: 25.00    Types: Cigarettes    Last attempt to quit: 01/21/1973    Years since quitting: 44.9  . Smokeless tobacco: Never Used  . Tobacco comment: began smoking in high school, quit age 59  Substance Use Topics  . Alcohol use: Yes    Alcohol/week: 0.0 oz    Comment: 1-2 per week  . Drug use: No     Allergies   Zoledronic acid   Review of Systems Review of Systems   Physical Exam Triage Vital Signs ED Triage Vitals  Enc Vitals Group     BP 12/13/17 0951 (!) 154/81     Pulse Rate 12/13/17 0951 91     Resp 12/13/17 0951 18     Temp 12/13/17 0951 97.9 F (36.6 C)     Temp Source 12/13/17 0951 Oral     SpO2 12/13/17 0951 99 %     Weight --      Height --      Head Circumference --      Peak Flow --      Pain Score 12/13/17 1005 4     Pain Loc --      Pain Edu? --      Excl. in  [place]? --    No data found.  Updated Vital Signs BP (!) 154/75 (BP Location: Left Arm)   Pulse 91   Temp 97.9 F (36.6 C) (Oral)   Resp 18   SpO2 99%   Visual Acuity Right Eye Distance:   Left Eye Distance:   Bilateral Distance:    Right Eye Near:   Left Eye Near:    Bilateral Near:     Physical Exam  Constitutional: He is oriented to person, place, and time. He appears well-developed and well-nourished.  Cardiovascular: Normal rate.  Pulmonary/Chest: Effort normal.  Musculoskeletal:       Legs: Left shin with area of swelling, mildly tender on palpation and some fluctuance noted; area approximately 2 inches in length; no drainage; mildly red; trace pitting edema noted to ankle with old bruising noted to medial ankle; see photo; linear skin abrasion, patient states he caused this from squeezing at the area    Neurological: He is alert and oriented to person, place, and time.         UC Treatments / Results  Labs (all labs ordered are listed, but only abnormal results are displayed) Labs Reviewed - No data to display  EKG None  Radiology No results found.  Procedures Procedures (including critical care time)  Medications Ordered in UC Medications - No data  to display  Initial Impression / Assessment and Plan / UC Course  I have reviewed the triage vital signs and the nursing notes.  Pertinent labs & imaging results that were available during my care of the patient were reviewed by me and considered in my medical decision making (see chart for details).     Sounds like hematoma development related to striking mower with shin and on eliquis. Localized swelling has improved. Concern for infection to  Hematoma at this time, doxy initiated. Encouraged recheck with PCP in the next 3-5 days, as if no improvement may need to go see ortho for potential evacuation. Patient verbalized understanding and agreeable to plan.  Ambulatory out of clinic without difficulty.     Final Clinical Impressions(s) / UC Diagnoses   Final diagnoses:  Hematoma  Cellulitis of left lower extremity     Discharge Instructions     Please start additional antibiotic. I am worried this is a hematoma (large bruise) which has become infected. Please follow up with your primary care provider in the next 3-5 days for recheck as if no improvement with antibiotics you may need further intervention such as evacuation of this area.  If you develop worsening of pain, fevers, swelling or otherwise worsening please return sooner.    ED Prescriptions    Medication Sig Dispense Auth. Provider   doxycycline (VIBRAMYCIN) 100 MG capsule Take 1 capsule (100 mg total) by mouth 2 (two) times daily for 10 days. 20 capsule Zigmund Gottron, NP     Controlled Substance Prescriptions [place] Controlled Substance Registry consulted? Not Applicable   Zigmund Gottron, NP 12/13/17 1041

## 2017-12-13 NOTE — Telephone Encounter (Signed)
Please review-Anastasiya V Hopkins, RMA  

## 2017-12-13 NOTE — Telephone Encounter (Signed)
1145 on Friday.  Thanks.

## 2017-12-13 NOTE — ED Triage Notes (Signed)
Pt presents with left leg and ankle swollen from insect bite

## 2017-12-13 NOTE — Telephone Encounter (Signed)
Copied from [place] [id number]. Topic: Appointment Scheduling - Scheduling Inquiry for Clinic >> Dec 13, 2017  1:34 PM Cecelia Byars, [place] wrote: Reason for CRM: Patient went to the ed / urgent care for an infection in he is left leg ,and was told to come in this week between  Wed and Fri,  to see his PCP there is nothing with Dr Damita Dunnings before Monday ,he prefers to see a M.D   for this situation ,please advise [phone]

## 2017-12-14 NOTE — Telephone Encounter (Signed)
Patient advised.

## 2017-12-17 ENCOUNTER — Encounter: Payer: Self-pay | Admitting: Family Medicine

## 2017-12-17 ENCOUNTER — Ambulatory Visit: Payer: Medicare Other | Admitting: Family Medicine

## 2017-12-17 VITALS — BP 142/78 | HR 76 | Temp 97.5°F | Ht 69.0 in | Wt 205.2 lb

## 2017-12-17 DIAGNOSIS — T148XXA Other injury of unspecified body region, initial encounter: Secondary | ICD-10-CM | POA: Diagnosis not present

## 2017-12-17 DIAGNOSIS — E1122 Type 2 diabetes mellitus with diabetic chronic kidney disease: Secondary | ICD-10-CM

## 2017-12-17 DIAGNOSIS — D508 Other iron deficiency anemias: Secondary | ICD-10-CM

## 2017-12-17 LAB — IBC PANEL
Iron: 140 ug/dL (ref 42–165)
Saturation Ratios: 40.8 % (ref 20.0–50.0)
Transferrin: 245 mg/dL (ref 212.0–360.0)

## 2017-12-17 LAB — HEMOGLOBIN A1C: HEMOGLOBIN A1C: 8.6 % — AB (ref 4.6–6.5)

## 2017-12-17 NOTE — Progress Notes (Signed)
He had bumped L shin on the mower deck.  It wasn't a severe injury.  He didn't fall.  Then he noted it after the fact.  He had some local bruising.  He was seen at outside clinic, notes reviewed.  Started on doxycycline in the meantime per outside clinic.  He is already on amoxicillin for routine suppressive therapy.  That was for separate issue.  Due for routine follow-up labs regarding diabetes.  He had outside labs done at the kidney clinic recently.  Creatinine 1.8.  Glucose was 206.  No adverse effect on current medications.  Compliant.  No hypoglycemia.  Anemia, on iron, no ADE on med, taking 5 days a week.  Still on anticoagulation.  had prev d/w pt that he is likely better off with continued iron replacement with ongoing anticoagulation.  Hemoglobin was normal at 14.7 on outside check recently.    PMH and SH reviewed  ROS: Per HPI unless specifically indicated in ROS section   Meds, vitals, and allergies reviewed.   GEN: nad, alert and oriented HEENT: mucous membranes moist NECK: supple w/o LA CV: rrr.  PULM: ctab, no inc wob ABD: soft, +bs EXT: no edema SKIN: 4x2 max diameter bruise on the L shin, no spreading erythema.  Minimally tender.  Not fluctuant.  No drainage.  Does not appear infected. Dependent bruising on the L ankle/foot.  This looks improved compared to the previous documentation noted in the chart. Intact DP pulse on L foot.

## 2017-12-17 NOTE — Patient Instructions (Addendum)
Finish the doxycycline and be careful in the sun.   Elevate your leg as much as you can.   It should gradually better.   You can ice it if needed for swelling.  5 minutes at a time with fabric next to the skin.   Take care.  Glad to see you.  Go to the lab on the way out.  We'll contact you with your lab report.

## 2017-12-19 ENCOUNTER — Encounter: Payer: Self-pay | Admitting: Family Medicine

## 2017-12-19 DIAGNOSIS — T148XXA Other injury of unspecified body region, initial encounter: Secondary | ICD-10-CM | POA: Insufficient documentation

## 2017-12-19 NOTE — Assessment & Plan Note (Signed)
Hemoglobin is still stable.  See notes on follow-up labs.  Continue iron for now.  We discussed that is probably safer for the patient to continue iron replacement and anticoagulation instead of pursuing more aggressive work-up, assuming his blood counts are still stable and as long as he is feeling fine.

## 2017-12-19 NOTE — Assessment & Plan Note (Signed)
Does not currently look infected.  No reason to try to evacuate the lesion at this point.  Appears to be improving.  Finish doxycycline in the meantime.  Routine cautions given.  Update me as needed.  Elevate leg in the meantime.  The distal bruising is dependent and expected, discussed with patient.  Should gradually resolve.

## 2017-12-19 NOTE — Assessment & Plan Note (Signed)
No change in meds at this point.  See notes on follow-up labs. 

## 2017-12-20 ENCOUNTER — Ambulatory Visit: Payer: Medicare Other | Admitting: Family Medicine

## 2017-12-21 ENCOUNTER — Encounter: Payer: Self-pay | Admitting: Family Medicine

## 2018-01-10 ENCOUNTER — Other Ambulatory Visit: Payer: Medicare Other

## 2018-01-14 ENCOUNTER — Ambulatory Visit: Payer: Medicare Other | Admitting: Family Medicine

## 2018-01-21 ENCOUNTER — Ambulatory Visit (INDEPENDENT_AMBULATORY_CARE_PROVIDER_SITE_OTHER): Payer: Medicare Other

## 2018-01-21 ENCOUNTER — Ambulatory Visit (HOSPITAL_COMMUNITY)
Admission: EM | Admit: 2018-01-21 | Discharge: 2018-01-21 | Disposition: A | Discharge status: Home / Self Care | Payer: Medicare Other | Attending: Internal Medicine | Admitting: Internal Medicine

## 2018-01-21 ENCOUNTER — Encounter (HOSPITAL_COMMUNITY): Payer: Self-pay

## 2018-01-21 DIAGNOSIS — S39012A Strain of muscle, fascia and tendon of lower back, initial encounter: Secondary | ICD-10-CM

## 2018-01-21 DIAGNOSIS — M5416 Radiculopathy, lumbar region: Secondary | ICD-10-CM | POA: Diagnosis not present

## 2018-01-21 MED ORDER — KETOROLAC TROMETHAMINE 30 MG/ML IJ SOLN
30.0000 mg | Freq: Once | INTRAMUSCULAR | Status: AC
  Administered 2018-01-21: 30 mg via INTRAMUSCULAR

## 2018-01-21 MED ORDER — DICLOFENAC SODIUM 1 % TD GEL: 2.0000 g | Freq: Four times a day (QID) | TRANSDERMAL | 0 refills | Status: DC

## 2018-01-21 MED ORDER — KETOROLAC TROMETHAMINE 30 MG/ML IJ SOLN
INTRAMUSCULAR | Status: AC
  Filled 2018-01-21: qty 1

## 2018-01-21 MED ORDER — TIZANIDINE HCL 2 MG PO CAPS: 2.0000 mg | ORAL_CAPSULE | Freq: Three times a day (TID) | ORAL | 0 refills | Status: DC

## 2018-01-21 MED ORDER — PREDNISONE 10 MG (21) PO TBPK: ORAL_TABLET | ORAL | 0 refills | Status: DC

## 2018-01-21 NOTE — ED Triage Notes (Signed)
Pt presents with lower back pain, denies any injury. Denies any numbness in his legs or trouble going to the bathroom. Pt is ambulatory in obvious pain.

## 2018-01-21 NOTE — ED Provider Notes (Signed)
[place]    CSN: [id number] Arrival date & time: 01/21/18  7425     History   Chief Complaint Chief Complaint  Patient presents with  . Back Pain    HPI Dean Murphy is a 84 y.o. male.   Is an 84 year old male with past medical history of Mia, arthritis, CAD, complete heart block, chronic kidney disease, constipation, CVA disease, diverticulosis, hypertension hyperlipidemia, GERD, MI, osteoporosis.  He presents with 2 days of worsening lower back pain more on the right lower lumbar.  He reports that he noticed it while walking yesterday and became progressively worse.  Has any injury to the back, heavy lifting, or strenuous exercise.  He took 2 Aleve yesterday and then oxycodone at night and was able to sleep through the night. He has also been using a heating pad. He has a blister in the area of the heating pad.  He has never experienced pain like this before.  He does have  a history of kidney stones but no history of back problems.  Denies any numbness, tingling, weakness or bowel or bladder issues.  He is a former smoker.   ROS per HPI      Past Medical History:  Diagnosis Date  . Anemia   . Arthritis   . CAD (coronary artery disease)    a. CABG 1989, b. Myoview low risk 2012.  . Carotid artery occlusion    a. Duplex 10/2014: patent R/L CEA with mild hyperplasia in right surgical bulb - followed by vascular.  . CHB (complete heart block) [facility]) March 2016   a. s/p STJ dual chamber pacemaker 07/2014.  [name] Chronic diastolic CHF (congestive heart failure) ([place])    a. Dx 01/5637 - acute diastolic CHF in the setting of CHB.  [name] Chronic kidney disease, stage IV (severe) (HCC)    stage III/IV as of 2015, [name]  . CKD (chronic kidney disease), stage IV ([place])   . Colon polyps   . Complication of anesthesia   . Constipation   . CVA (cerebral infarction)   . Diabetes mellitus    type II  . Diverticulosis   . Dupuytren's disease    finger right hand  contracted  . Essential hypertension   . GERD (gastroesophageal reflux disease)   . Hemorrhoids   . Hyperlipidemia   . Myocardial infarction [facility]) 1976  and Mar. 19, 2016  . Orthostasis   . Osteoporosis   . PAF (paroxysmal atrial fibrillation) [facility]) March 2016  . PONV (postoperative nausea and vomiting)   . Presence of permanent cardiac pacemaker   . Shortness of breath dyspnea    due to pain    Patient Active Problem List   Diagnosis Date Noted  . Bruise 12/19/2017  . Infection of prosthetic left knee joint ([place]) 07/29/2015  . Coronary artery disease 07/29/2015  . Stroke ([place]) 12/24/2014  . Double vision 12/23/2014  . Pacemaker 11/16/2014  . Anemia 09/27/2014  . Gastrointestinal hemorrhage associated with peptic ulcer 09/07/2014  . Atrial fibrillation-CHADS VASC2 = 5 (age, HTN, DM, Vasc) 08/13/2014  . Chest pain 08/11/2014  . Complete heart block ([place]) 08/11/2014  . S/P CABG x 5 1989. Low risk Myoview 2012 08/11/2014  . Syncope 08/11/2014  . Cough 06/14/2014  . Left foot pain 02/23/2014  . Gouty arthropathy 08/09/2013  . Chronic radicular lumbar pain 12/15/2012  . PVD- s/p bilat CEA- CA dopplers OK Jan 2015 10/06/2012  . Insomnia 09/30/2011  . OTHER SPECIFIED SITES  OF SPRAINS AND STRAINS 04/25/2010  . DIVERTICULITIS OF COLON 02/21/2010  . TEMPOROMANDIBULAR JOINT DISORDER 11/22/2009  . DM (diabetes mellitus), type 2 with renal complications ([place]) 95/62/1308  . Chronic kidney disease, stage III (moderate) ([place]) 07/26/2008  . CHRONIC PROSTATITIS 07/26/2008  . ADVEF, DRUG/MEDICINAL/BIOLOGICAL SUBST NOS 03/16/2007  . Dyslipidemia 11/15/2006  . Essential hypertension 11/15/2006  . GERD 11/15/2006  . Osteoarthritis 11/15/2006    Past Surgical History:  Procedure Laterality Date  . CARDIAC CATHETERIZATION  08/11/2014   Procedure: TEMPORARY PACEMAKER;  Surgeon: [name], MD;  Location: [facility] CATH LAB;  Service: Cardiovascular;;  . CAROTID ENDARTERECTOMY  12/31/10    Right  . CAROTID ENDARTERECTOMY  02/09/11   left  . CHOLECYSTECTOMY    . CORONARY ARTERY BYPASS GRAFT  1989  . EYE SURGERY Bilateral    Cataract with implants  . I&D KNEE WITH POLY EXCHANGE Left 07/29/2015   Procedure: IRRIGATION AND DEBRIDEMENT LEFT KNEE WITH POLY EXCHANGE;  Surgeon: Rod Can, MD;  Location: [place];  Service: Orthopedics;  Laterality: Left;  . IR GENERIC HISTORICAL  03/04/2016   IR US GUIDE VASC ACCESS RIGHT 03/04/2016 Marybelle Killings, MD [facility]-INTER[facility] RAD  . IR GENERIC HISTORICAL  03/04/2016   IR FLUORO GUIDE CV LINE RIGHT 03/04/2016 Marybelle Killings, MD [facility]INTER[facility]  . JOINT REPLACEMENT     bilat. knees  . PERMANENT PACEMAKER INSERTION N/A 08/13/2014   STJ dual chamber pacemaker implanted by [name] for CHB  . TONSILLECTOMY    . TOTAL KNEE ARTHROPLASTY     bilateral       Home Medications    Prior to Admission medications   Medication Sig Start Date End Date Taking? Authorizing Provider  amLODipine (NORVASC) 2.5 MG tablet Take 1 tablet (2.5 mg total) by mouth daily. 09/03/16   Tonia Ghent, MD  amoxicillin (AMOXIL) 500 MG capsule Take 1 capsule (500 mg total) by mouth 2 (two) times daily. 06/10/17   Michel Bickers, MD  diclofenac sodium (VOLTAREN) 1 % GEL Apply 2 g topically 4 (four) times daily. 01/21/18   Loura Halt A, NP  diltiazem (TIAZAC) 360 MG 24 hr capsule TAKE ONE CAPSULE BY MOUTH DAILY 08/31/17   Dorothy Spark, MD  ELIQUIS 2.5 MG TABS tablet TAKE 1 TABLET BY MOUTH TWICE A DAY 10/29/17   Dorothy Spark, MD  ferrous sulfate 325 (65 FE) MG tablet TAKE 1 TABLET BY MOUTH 5 DAYS A WEEK 05/11/17   Tonia Ghent, MD  glipiZIDE (GLUCOTROL) 5 MG tablet TAKE 2 TABLETS BY MOUTH EVERY MORNING BEFORE BREAKFAST AND 1 TABLET BEFORE SUPPER 11/05/17   Tonia Ghent, MD  Insulin Glargine (BASAGLAR KWIKPEN) 100 UNIT/ML SOPN Inject 0.15 mLs (15 Units total) into the skin at bedtime. 09/14/17   Tonia Ghent, MD  JANUVIA 50 MG tablet TAKE 1 TABLET BY MOUTH DAILY  11/19/17   Tonia Ghent, MD  LEADER UNIFINE PENTIPS 31G X 5 MM MISC USE DAILY WITH INSULIN PEN 02/08/17   Tonia Ghent, MD  losartan (COZAAR) 50 MG tablet Take 50 mg by mouth daily.    [provider]  lubiprostone (AMITIZA) 8 MCG capsule TAKE ONE CAPSULE BY MOUTH EACH MORNING 11/21/17   Tonia Ghent, MD  omeprazole (PRILOSEC) 40 MG capsule Take 1 capsule (40 mg total) by mouth daily. 09/29/17   Tonia Ghent, MD  Ophthalmic Irrigation Solution (EYE [place]) 99.05 % SOLN Place 1 drop into both eyes 2 (two) times daily.  [provider]  predniSONE (STERAPRED UNI-PAK 21 TAB) 10 MG (21) TBPK tablet 6 tabs for 1 day, then 5 tabs for 1 das, then 4 tabs for 1 day, then 3 tabs for 1 day, 2 tabs for 1 day, then 1 tab for 1 day 01/21/18   Loura Halt A, NP  rosuvastatin (CRESTOR) 20 MG tablet TAKE 1 TABLET BY MOUTH DAILY 11/30/17   Dorothy Spark, MD  tizanidine (ZANAFLEX) 2 MG capsule Take 1 capsule (2 mg total) by mouth 3 (three) times daily. 01/21/18   Orvan July, NP    Family History Family History  Problem Relation Age of Onset  . Heart disease Mother        Before age 1  . Diabetes Mother   . Varicose Veins Mother   . Heart attack Mother   . Heart attack Father   . Heart disease Father        After age 32  . Hypertension Father   . Heart disease Brother        Before age 51  . Hypertension Brother   . Heart attack Brother   . Diabetes Son   . Hypertension Son   . Hypertension Son   . Diabetes Son   . Diabetes Unknown   . Cancer Unknown   . Colon cancer Neg Hx     Social History Social History   Tobacco Use  . Smoking status: Former Smoker    Packs/day: 1.00    Years: 25.00    Pack years: 25.00    Types: Cigarettes    Last attempt to quit: 01/21/1973    Years since quitting: 45.0  . Smokeless tobacco: Never Used  . Tobacco comment: began smoking in high school, quit age 88  Substance Use Topics  . Alcohol use: Yes    Alcohol/week: 0.0  standard drinks    Comment: 1-2 per week  . Drug use: No     Allergies   Zoledronic acid   Review of Systems Review of Systems   Physical Exam Triage Vital Signs ED Triage Vitals  Enc Vitals Group     BP 01/21/18 1017 (!) 162/65     Pulse Rate 01/21/18 1017 (!) 59     Resp 01/21/18 1017 18     Temp 01/21/18 1017 97.8 F (36.6 C)     Temp src --      SpO2 01/21/18 1017 100 %     Weight --      Height --      Head Circumference --      Peak Flow --      Pain Score 01/21/18 1018 9     Pain Loc --      Pain Edu? --      Excl. in [place]? --    No data found.  Updated Vital Signs BP (!) 162/65   Pulse (!) 59   Temp 97.8 F (36.6 C)   Resp 18   SpO2 100%   Visual Acuity Right Eye Distance:   Left Eye Distance:   Bilateral Distance:    Right Eye Near:   Left Eye Near:    Bilateral Near:     Physical Exam  Constitutional: He is oriented to person, place, and time. He appears well-developed and well-nourished.  Very pleasant.  Nontoxic or ill-appearing.  Patient in obvious pain wearing back brace  HENT:  Head: Normocephalic and atraumatic.  Nose: Nose normal.  Neck: Normal range of motion.  Pulmonary/Chest: Effort normal.  Musculoskeletal: He exhibits edema and tenderness. He exhibits no deformity.  Moderate tenderness to the right lumbar paravertebral muscles and right sciatic notch. No midline tenderness. ROM limited due to pain.  blister noted to right lower lumbar area, most likely for heating pad.  No bruising, rash, erythema or deformity.   Neurological: He is alert and oriented to person, place, and time.  Skin: Skin is warm and dry.  Psychiatric: He has a normal mood and affect.  Nursing note and vitals reviewed.    UC Treatments / Results  Labs (all labs ordered are listed, but only abnormal results are displayed) Labs Reviewed - No data to display  EKG None  Radiology Dg Lumbar Spine Complete  Result Date: 01/21/2018 CLINICAL DATA:  Low  back pain for 2 days, no known injury, initial encounter EXAM: LUMBAR SPINE - COMPLETE 4+ VIEW COMPARISON:  04/25/2010 FINDINGS: Five lumbar type vertebral bodies are well visualized. Vertebral body height is well maintained. Bilateral L5 pars defects are noted with mild anterolisthesis. The overall appearance is similar to that seen on the prior exam. No compression deformity is noted. Mild osteophytic changes are seen. Diffuse aortic calcifications are noted. IMPRESSION: Mild anterolisthesis of L5 on S1 secondary to bilateral pars defects at L5. No acute abnormality is noted. Electronically Signed   By: Inez Catalina M.D.   On: 01/21/2018 11:20    Procedures Procedures (including critical care time)  Medications Ordered in UC Medications  ketorolac (TORADOL) 30 MG/ML injection 30 mg (30 mg Intramuscular Given 01/21/18 1145)    Initial Impression / Assessment and Plan / UC Course  I have reviewed the triage vital signs and the nursing notes.  Pertinent labs & imaging results that were available during my care of the patient were reviewed by me and considered in my medical decision making (see chart for details).      Xray compared to the previous one revealed no new acute process. Most likely lumbar strain. Will treat with low does muscle relaxant and Voltaren gel.  Instructed that if this is not helping the the next day or 2 to go ahead and start the prednisone taper.  Pt understanding and agreeable to plan.  Final Clinical Impressions(s) / UC Diagnoses   Final diagnoses:  Strain of lumbar region, initial encounter     Discharge Instructions     It was nice meeting you!!  Your x-ray was normal compared to your last x-ray. We will give you a injection of Toradol in clinic 30 mg Sending you home with some Voltaren gel to rub on the area. Also giving you a low dose of muscle relaxant. Be aware this may make you drowsy.  Follow-up as needed if not getting better. For worsening  symptoms please go to the ER    ED Prescriptions    Medication Sig Dispense Auth. Provider   diclofenac sodium (VOLTAREN) 1 % GEL Apply 2 g topically 4 (four) times daily. 100 Tube Ledarrius Beauchaine A, NP   predniSONE (STERAPRED UNI-PAK 21 TAB) 10 MG (21) TBPK tablet 6 tabs for 1 day, then 5 tabs for 1 das, then 4 tabs for 1 day, then 3 tabs for 1 day, 2 tabs for 1 day, then 1 tab for 1 day 21 tablet Kymberley Raz A, NP   tizanidine (ZANAFLEX) 2 MG capsule Take 1 capsule (2 mg total) by mouth 3 (three) times daily. 30 capsule Loura Halt A, NP     Controlled Substance Prescriptions  [place] Controlled Substance Registry consulted? Not Applicable   Orvan July, NP 01/21/18 1553

## 2018-01-21 NOTE — Discharge Instructions (Signed)
It was nice meeting you!!  Your x-ray was normal compared to your last x-ray. We will give you a injection of Toradol in clinic 30 mg Sending you home with some Voltaren gel to rub on the area. Also giving you a low dose of muscle relaxant. Be aware this may make you drowsy.  Follow-up as needed if not getting better. For worsening symptoms please go to the ER

## 2018-01-31 ENCOUNTER — Ambulatory Visit (INDEPENDENT_AMBULATORY_CARE_PROVIDER_SITE_OTHER): Payer: Medicare Other | Admitting: *Deleted

## 2018-01-31 ENCOUNTER — Telehealth: Payer: Self-pay

## 2018-01-31 DIAGNOSIS — I442 Atrioventricular block, complete: Secondary | ICD-10-CM

## 2018-01-31 NOTE — Telephone Encounter (Signed)
LMOVM reminding pt to send remote transmission.   

## 2018-02-01 ENCOUNTER — Ambulatory Visit: Payer: Medicare Other | Admitting: Family Medicine

## 2018-02-01 ENCOUNTER — Encounter: Payer: Self-pay | Admitting: Cardiology

## 2018-02-01 ENCOUNTER — Encounter: Payer: Self-pay | Admitting: Family Medicine

## 2018-02-01 VITALS — BP 142/58 | HR 68 | Temp 97.4°F | Ht 69.0 in | Wt 201.8 lb

## 2018-02-01 DIAGNOSIS — M545 Low back pain: Secondary | ICD-10-CM | POA: Diagnosis not present

## 2018-02-01 MED ORDER — TRAMADOL HCL 50 MG PO TABS: 50.0000 mg | ORAL_TABLET | Freq: Two times a day (BID) | ORAL | 0 refills | Status: DC | PRN

## 2018-02-01 NOTE — Progress Notes (Signed)
R hip and lower back pain.  Started about 2+ weeks ago.  Seen at [facility].  Started on pred taper, with sig sugar elevation >300, now back to ~160s off prednisone.  Prednisone helped the pain but affected his sugar.  Still on tizanidine, helping a little.    No pain radiating down the leg.  Taking up to 4 aleve a day.  No L sided sx.  Normal sensation in the legs.  Pain near R buttock, near the pocket in his pants.  He doesn't keep a wallet there.  No FCVD.  Urine output is normal.  No dysuria.    No trigger for the pain. No falls.    Heat didn't help, hasn't tried ice.  nad ncat rrr ctab abd soft Back not ttp.  R SLR neg   Normal R hip ROM R SI ttp, also tender at the R SI joint with testing. Able to bear weight. No rash.

## 2018-02-01 NOTE — Patient Instructions (Addendum)
Use tizanidine as needed.  Try icing for 5 minutes at a time.  Use tramadol for pain.   Sedation cautions.  We will call about your referral.  Rosaria Ferries or Azalee Course will call you if you don't see one of them on the way out.  Okay to get a massage.   It may help.  Take care.  Glad to see you.

## 2018-02-01 NOTE — Progress Notes (Signed)
Remote pacemaker transmission.   

## 2018-02-02 DIAGNOSIS — M545 Low back pain, unspecified: Secondary | ICD-10-CM | POA: Insufficient documentation

## 2018-02-02 NOTE — Assessment & Plan Note (Signed)
Exam more typical of SI joint pain.  Does not appear to be a primary hip issue.  Imaging would likely not change plan at this point.  Discussed options.  He can use ice.  Gently stretch.  Okay to get a massage.  Use tramadol as needed with sedation caution.  Refer to physical therapy.  He agrees.

## 2018-02-17 ENCOUNTER — Ambulatory Visit: Payer: Medicare Other | Admitting: Cardiology

## 2018-02-17 VITALS — BP 148/68 | HR 84 | Ht 69.0 in | Wt 200.0 lb

## 2018-02-17 DIAGNOSIS — I2510 Atherosclerotic heart disease of native coronary artery without angina pectoris: Secondary | ICD-10-CM | POA: Diagnosis not present

## 2018-02-17 DIAGNOSIS — I10 Essential (primary) hypertension: Secondary | ICD-10-CM

## 2018-02-17 DIAGNOSIS — Z9889 Other specified postprocedural states: Secondary | ICD-10-CM | POA: Diagnosis not present

## 2018-02-17 DIAGNOSIS — I480 Paroxysmal atrial fibrillation: Secondary | ICD-10-CM | POA: Diagnosis not present

## 2018-02-17 DIAGNOSIS — I442 Atrioventricular block, complete: Secondary | ICD-10-CM

## 2018-02-17 NOTE — Progress Notes (Signed)
Patient ID: Dean Murphy, male   DOB: [birth year], 84 y.o.   MRN: [id number]     Cardiology Office Note Date:  02/17/2018  Patient ID:  Dean Murphy, Dean Murphy 1926/12/24, MRN [id number] PCP:  Tonia Ghent, MD  Cardiologist: Dr. Meda Coffee  Chief Complaint, reason for visit: 6 months follow up  History of Present Illness: Dean Murphy is a 84 y.o. male with history of CAD s/p CABG 1989, CKD stage IV, HTN with history of orthostasis, DM, anemia, CHB 12/5460 (complicated by acute diastolic CHF) s/p PPM, CVA, carotid disease s/p CEAs (followed by [facility]), hyperlipidemia, PAF who presents for f/u exertional fatigue and low BP.  02/17/2018 -this is 6 months follow-up, the patient is recovered from left knee osteomyelitis, he and his wife are enjoying life, they have been traveling on cruises, he states that he has not had any problems lately, denies chest pain shortness of breath, he has no lower extremity edema orthopnea proximal dyspnea.  He is tolerating Crestor and Eliquis well, has no muscle pain and no bleeding.  He has good appetite and good stamina.  He denies any major memory problems.  Past Medical History:  Diagnosis Date  . Anemia   . Arthritis   . CAD (coronary artery disease)    a. CABG 1989, b. Myoview low risk 2012.  . Carotid artery occlusion    a. Duplex 10/2014: patent R/L CEA with mild hyperplasia in right surgical bulb - followed by vascular.  . CHB (complete heart block) [facility]) March 2016   a. s/p STJ dual chamber pacemaker 07/2014.  [name] Chronic diastolic CHF (congestive heart failure) ([place])    a. Dx 11/348 - acute diastolic CHF in the setting of CHB.  [name] Chronic kidney disease, stage IV (severe) (HCC)    stage III/IV as of 2015, [name]  . CKD (chronic kidney disease), stage IV ([place])   . Colon polyps   . Complication of anesthesia   . Constipation   . CVA (cerebral infarction)   . Diabetes mellitus    type II  . Diverticulosis   . Dupuytren's disease    finger right  hand contracted  . Essential hypertension   . GERD (gastroesophageal reflux disease)   . Hemorrhoids   . Hyperlipidemia   . Myocardial infarction [facility]) 1976  and Mar. 19, 2016  . Orthostasis   . Osteoporosis   . PAF (paroxysmal atrial fibrillation) [facility]) March 2016  . PONV (postoperative nausea and vomiting)   . Presence of permanent cardiac pacemaker   . Shortness of breath dyspnea    due to pain    Past Surgical History:  Procedure Laterality Date  . CARDIAC CATHETERIZATION  08/11/2014   Procedure: TEMPORARY PACEMAKER;  Surgeon: [name], MD;  Location: [facility] CATH LAB;  Service: Cardiovascular;;  . CAROTID ENDARTERECTOMY  12/31/10   Right  . CAROTID ENDARTERECTOMY  02/09/11   left  . CHOLECYSTECTOMY    . CORONARY ARTERY BYPASS GRAFT  1989  . EYE SURGERY Bilateral    Cataract with implants  . I&D KNEE WITH POLY EXCHANGE Left 07/29/2015   Procedure: IRRIGATION AND DEBRIDEMENT LEFT KNEE WITH POLY EXCHANGE;  Surgeon: Rod Can, MD;  Location: [place];  Service: Orthopedics;  Laterality: Left;  . IR GENERIC HISTORICAL  03/04/2016   IR US GUIDE VASC ACCESS RIGHT 03/04/2016 Marybelle Killings, MD [facility]INTER[facility] RAD  . IR GENERIC HISTORICAL  03/04/2016   IR FLUORO GUIDE CV LINE RIGHT 03/04/2016 Marybelle Killings,  MD [facility]INTER[facility] RAD  . JOINT REPLACEMENT     bilat. knees  . PERMANENT PACEMAKER INSERTION N/A 08/13/2014   STJ dual chamber pacemaker implanted by [name] for CHB  . TONSILLECTOMY    . TOTAL KNEE ARTHROPLASTY     bilateral    Current Outpatient Medications  Medication Sig Dispense Refill  . amLODipine (NORVASC) 2.5 MG tablet Take 1 tablet (2.5 mg total) by mouth daily. 90 tablet 3  . amoxicillin (AMOXIL) 500 MG capsule Take 1 capsule (500 mg total) by mouth 2 (two) times daily. 180 capsule 3  . diltiazem (TIAZAC) 360 MG 24 hr capsule TAKE ONE CAPSULE BY MOUTH DAILY 90 capsule 1  . ELIQUIS 2.5 MG TABS tablet TAKE 1 TABLET BY MOUTH TWICE A DAY 180 tablet 2  . ferrous sulfate 325 (65  FE) MG tablet TAKE 1 TABLET BY MOUTH 5 DAYS A WEEK    . glipiZIDE (GLUCOTROL) 5 MG tablet TAKE 2 TABLETS BY MOUTH EVERY MORNING BEFORE BREAKFAST AND 1 TABLET BEFORE SUPPER 270 tablet 2  . Insulin Glargine (BASAGLAR KWIKPEN) 100 UNIT/ML SOPN Inject 0.15 mLs (15 Units total) into the skin at bedtime.    [name] JANUVIA 50 MG tablet TAKE 1 TABLET BY MOUTH DAILY 30 tablet 5  . LEADER UNIFINE PENTIPS 31G X 5 MM MISC USE DAILY WITH INSULIN PEN 100 each 3  . losartan (COZAAR) 50 MG tablet Take 50 mg by mouth daily.    [name] lubiprostone (AMITIZA) 8 MCG capsule TAKE ONE CAPSULE BY MOUTH EACH MORNING 90 capsule 1  . omeprazole (PRILOSEC) 40 MG capsule Take 1 capsule (40 mg total) by mouth daily. 90 capsule 3  . Ophthalmic Irrigation Solution (EYE [place]) 99.05 % SOLN Place 1 drop into both eyes 2 (two) times daily.    . rosuvastatin (CRESTOR) 20 MG tablet TAKE 1 TABLET BY MOUTH DAILY 90 tablet 2  . tizanidine (ZANAFLEX) 2 MG capsule Take 1 capsule (2 mg total) by mouth 3 (three) times daily. 30 capsule 0   No current facility-administered medications for this visit.     Allergies:   Zoledronic acid   Social History:  The patient  reports that he quit smoking about 45 years ago. His smoking use included cigarettes. He has a 25.00 pack-year smoking history. He has never used smokeless tobacco. He reports that he drinks alcohol. He reports that he does not use drugs.   Family History:  The patient's family history includes Cancer in his unknown relative; Diabetes in his mother, son, son, and unknown relative; Heart attack in his brother, father, and mother; Heart disease in his brother, father, and mother; Hypertension in his brother, father, son, and son; Varicose Veins in his mother.  ROS:  Please see the history of present illness.   All other systems are reviewed and otherwise negative.   PHYSICAL EXAM:  VS:  BP (!) 148/68   Pulse 84   Ht 5\' 9"  (1.753 m)   Wt 200 lb (90.7 kg)   BMI 29.53 kg/m  BMI: Body  mass index is 29.53 kg/m. Well nourished, well developed WM, in no acute distress - lively, acts younger than stated age  31: normocephalic, atraumatic  Neck: no JVD, +right carotid bruit, no masses Cardiac:  normal S1, S2; RRR; no murmurs, rubs, or gallops Lungs:  clear to auscultation bilaterally, no wheezing, rhonchi or rales  Abd: soft, nontender, no hepatomegaly, + BS MS: no deformity or atrophy Ext: no edema  Skin: warm and dry,  no rash Neuro:  moves all extremities spontaneously, no focal abnormalities noted, follows commands Psych: euthymic mood, full affect   EKG:  A sensed V paced rhythm with ventricular rate 76 bpm.  Recent Labs: 09/07/2017: BUN 17; Hemoglobin 14.7; Platelets 149.0; Potassium 4.6; Sodium 144 12/02/2017: Creatinine, Ser 1.86  No results found for requested labs within last 8760 hours.   CrCl cannot be calculated (Patient's most recent lab result is older than the maximum 21 days allowed.).   Wt Readings from Last 3 Encounters:  02/17/18 200 lb (90.7 kg)  02/01/18 201 lb 12 oz (91.5 kg)  12/17/17 205 lb 4 oz (93.1 kg)    EKG 07/06/2016, normal sinus rhythm, the paced rhythm, unchanged from prior.  Other studies reviewed: Additional studies/records reviewed today include: summarized above  TTE: 08/2016 - Left ventricle: The cavity size was normal. There was moderate   concentric hypertrophy. Systolic function was normal. The   estimated ejection fraction was in the range of 55% to 60%.   Hypokinesis of the apical septal myocardium. Doppler parameters   are consistent with abnormal left ventricular relaxation (grade 1   diastolic dysfunction). Doppler parameters are consistent with   indetermiante ventricular filling pressure. - Aortic valve: Transvalvular velocity was within the normal range.   There was no stenosis. There was no regurgitation. - Mitral valve: Transvalvular velocity was within the normal range.   There was no evidence for  stenosis. There was mild regurgitation. - Left atrium: The atrium was severely dilated. - Right ventricle: The cavity size was normal. Wall thickness was   normal. Systolic function was normal. - Atrial septum: No defect or patent foramen ovale was identified   by color flow Doppler. - Tricuspid valve: There was trivial regurgitation. - Pulmonary arteries: Systolic pressure was mildly increased. PA   peak pressure: 41 mm Hg (S).   ASSESSMENT AND PLAN:  1. CAD - patient is asymptomatic and active.  No ischemic work-up is needed.  2. Lower extremity edema -resolved.   3. Hypertension -controlled on current regimen.  He has mild orthostatic hypotension in the morning, I would not increase his medications.  4. Paroxysmal atrial fibrillation - maintaining NSR. Continue Eliquis. No bleeding.  Most recent hemoglobin 14.7.  5. Chronic diastolic CHF - appears euvolemic.    6. Carotid disease - s/p B/L endarterectomy in 2012, stable carotid US in 10/2014, followed by Dr Oneida Alar.  7. Status post pacemaker placement for complete heart block, followed by Dr. Lovena Le.  Last seen in June 2019 and functioning properly.  Follow up in 6 months.  Signed, Ena Dawley, MD  02/17/2018 9:13 AM     [facility] [address] [place] [address] [address] [phone] (office)  [phone] (fax)

## 2018-02-17 NOTE — Patient Instructions (Signed)

## 2018-02-22 LAB — CUP PACEART REMOTE DEVICE CHECK
Battery Voltage: 3.01 V
Brady Statistic AP VP Percent: 8.9 %
Brady Statistic RA Percent Paced: 8.8 %
Implantable Lead Implant Date: 20160321
Implantable Lead Location: 753860
Lead Channel Impedance Value: 550 Ohm
Lead Channel Pacing Threshold Amplitude: 1.75 V
Lead Channel Pacing Threshold Pulse Width: 0.5 ms
Lead Channel Pacing Threshold Pulse Width: 0.8 ms
Lead Channel Sensing Intrinsic Amplitude: 10.1 mV
Lead Channel Setting Pacing Amplitude: 3 V
Lead Channel Setting Sensing Sensitivity: 8 mV
MDC IDC LEAD IMPLANT DT: 20160321
MDC IDC LEAD LOCATION: 753859
MDC IDC MSMT BATTERY REMAINING LONGEVITY: 93 mo
MDC IDC MSMT BATTERY REMAINING PERCENTAGE: 95.5 %
MDC IDC MSMT LEADCHNL RA IMPEDANCE VALUE: 410 Ohm
MDC IDC MSMT LEADCHNL RA SENSING INTR AMPL: 2 mV
MDC IDC MSMT LEADCHNL RV PACING THRESHOLD AMPLITUDE: 0.75 V
MDC IDC PG IMPLANT DT: 20160321
MDC IDC PG SERIAL: 7734710
MDC IDC SESS DTM: 20190910041512
MDC IDC SET LEADCHNL RV PACING AMPLITUDE: 2.5 V
MDC IDC SET LEADCHNL RV PACING PULSEWIDTH: 0.5 ms
MDC IDC STAT BRADY AP VS PERCENT: 1 %
MDC IDC STAT BRADY AS VP PERCENT: 91 %
MDC IDC STAT BRADY AS VS PERCENT: 1 %
MDC IDC STAT BRADY RV PERCENT PACED: 99 %
Pulse Gen Model: 2240

## 2018-02-26 ENCOUNTER — Other Ambulatory Visit: Payer: Self-pay | Admitting: Cardiology

## 2018-02-28 ENCOUNTER — Other Ambulatory Visit: Payer: Self-pay | Admitting: *Deleted

## 2018-02-28 NOTE — Telephone Encounter (Signed)
Faxed refill request for Ferrous Sulfate.  Faxed request states: Take 1 tablet by mouth twice a day with a meal.   Our records show: Take 1 tablet by mouth 5 days a week.  Left detailed message on voicemail of patient to find out how he is currently taking the medication.

## 2018-03-01 MED ORDER — FERROUS SULFATE 325 (65 FE) MG PO TABS: ORAL_TABLET | ORAL | 3 refills | Status: DC

## 2018-03-01 NOTE — Telephone Encounter (Signed)
My understanding is that he has been taking it 5 days a week.  Please get updated but prescription sent in the meantime.  Thanks.

## 2018-03-01 NOTE — Telephone Encounter (Signed)
Thanks

## 2018-03-01 NOTE — Telephone Encounter (Signed)
Pt called and notes that he is taking 1 tablet per day Monday-Friday. Pt notes he has a week supply right now. Advised RX was sent to the pharmacy for him.

## 2018-03-15 ENCOUNTER — Ambulatory Visit: Payer: Medicare Other | Admitting: Family Medicine

## 2018-04-11 ENCOUNTER — Other Ambulatory Visit: Payer: Self-pay | Admitting: Family Medicine

## 2018-04-11 ENCOUNTER — Other Ambulatory Visit (INDEPENDENT_AMBULATORY_CARE_PROVIDER_SITE_OTHER): Payer: Medicare Other

## 2018-04-11 DIAGNOSIS — E1122 Type 2 diabetes mellitus with diabetic chronic kidney disease: Secondary | ICD-10-CM

## 2018-04-11 DIAGNOSIS — D509 Iron deficiency anemia, unspecified: Secondary | ICD-10-CM

## 2018-04-11 LAB — COMPREHENSIVE METABOLIC PANEL
ALBUMIN: 4.4 g/dL (ref 3.5–5.2)
ALT: 20 U/L (ref 0–53)
AST: 20 U/L (ref 0–37)
Alkaline Phosphatase: 72 U/L (ref 39–117)
BUN: 17 mg/dL (ref 6–23)
CHLORIDE: 107 meq/L (ref 96–112)
CO2: 30 meq/L (ref 19–32)
CREATININE: 1.72 mg/dL — AB (ref 0.40–1.50)
Calcium: 10 mg/dL (ref 8.4–10.5)
GFR: 39.99 mL/min — ABNORMAL LOW (ref >60.00)
Glucose, Bld: 150 mg/dL — ABNORMAL HIGH (ref 70–99)
Potassium: 5 mEq/L (ref 3.5–5.1)
SODIUM: 145 meq/L (ref 135–145)
Total Bilirubin: 0.6 mg/dL (ref 0.2–1.2)
Total Protein: 6.9 g/dL (ref 6.0–8.3)

## 2018-04-11 LAB — CBC WITH DIFFERENTIAL/PLATELET
Basophils Absolute: 0.1 10*3/uL (ref 0.0–0.1)
Basophils Relative: 1.1 % (ref 0.0–3.0)
EOS PCT: 2.6 % (ref 0.0–5.0)
Eosinophils Absolute: 0.2 10*3/uL (ref 0.0–0.7)
HEMATOCRIT: 45.9 % (ref 39.0–52.0)
Hemoglobin: 15.6 g/dL (ref 13.0–17.0)
LYMPHS ABS: 2.2 10*3/uL (ref 0.7–4.0)
Lymphocytes Relative: 25.4 % (ref 12.0–46.0)
MCHC: 33.9 g/dL (ref 30.0–36.0)
MCV: 92.1 fl (ref 78.0–100.0)
MONO ABS: 0.6 10*3/uL (ref 0.1–1.0)
MONOS PCT: 7.2 % (ref 3.0–12.0)
NEUTROS PCT: 63.7 % (ref 43.0–77.0)
Neutro Abs: 5.5 10*3/uL (ref 1.4–7.7)
PLATELETS: 142 10*3/uL — AB (ref 150.0–400.0)
RBC: 4.98 Mil/uL (ref 4.22–5.81)
RDW: 13.4 % (ref 11.5–15.5)
WBC: 8.5 10*3/uL (ref 4.0–10.5)

## 2018-04-11 LAB — IBC PANEL
Iron: 223 ug/dL — ABNORMAL HIGH (ref 42–165)
Saturation Ratios: 58.1 % — ABNORMAL HIGH (ref 20.0–50.0)
Transferrin: 274 mg/dL (ref 212.0–360.0)

## 2018-04-11 LAB — LIPID PANEL
CHOLESTEROL: 166 mg/dL (ref 0–200)
HDL: 45 mg/dL (ref >39.00)
LDL CALC: 86 mg/dL (ref 0–99)
NonHDL: 121.39
Total CHOL/HDL Ratio: 4
Triglycerides: 175 mg/dL — ABNORMAL HIGH (ref 0.0–149.0)
VLDL: 35 mg/dL (ref 0.0–40.0)

## 2018-04-11 LAB — URIC ACID: URIC ACID, SERUM: 6.2 mg/dL (ref 4.0–7.8)

## 2018-04-11 LAB — HEMOGLOBIN A1C: HEMOGLOBIN A1C: 7.7 % — AB (ref 4.6–6.5)

## 2018-04-15 ENCOUNTER — Encounter: Payer: Self-pay | Admitting: Family Medicine

## 2018-04-15 ENCOUNTER — Ambulatory Visit: Payer: Medicare Other | Admitting: Family Medicine

## 2018-04-15 VITALS — BP 152/62 | HR 78 | Temp 97.5°F | Ht 69.0 in | Wt 207.0 lb

## 2018-04-15 DIAGNOSIS — N183 Chronic kidney disease, stage 3 unspecified: Secondary | ICD-10-CM

## 2018-04-15 DIAGNOSIS — E1122 Type 2 diabetes mellitus with diabetic chronic kidney disease: Secondary | ICD-10-CM | POA: Diagnosis not present

## 2018-04-15 DIAGNOSIS — D508 Other iron deficiency anemias: Secondary | ICD-10-CM

## 2018-04-15 DIAGNOSIS — T8454XD Infection and inflammatory reaction due to internal left knee prosthesis, subsequent encounter: Secondary | ICD-10-CM

## 2018-04-15 DIAGNOSIS — I10 Essential (primary) hypertension: Secondary | ICD-10-CM | POA: Diagnosis not present

## 2018-04-15 MED ORDER — BASAGLAR KWIKPEN 100 UNIT/ML ~~LOC~~ SOPN: 20.0000 [IU] | PEN_INJECTOR | Freq: Every day | SUBCUTANEOUS | Status: DC

## 2018-04-15 MED ORDER — FERROUS SULFATE 325 (65 FE) MG PO TABS: ORAL_TABLET | ORAL | 3 refills | Status: DC

## 2018-04-15 MED ORDER — BASAGLAR KWIKPEN 100 UNIT/ML ~~LOC~~ SOPN: 20.0000 [IU] | PEN_INJECTOR | Freq: Every day | SUBCUTANEOUS | 5 refills | Status: DC

## 2018-04-15 MED ORDER — INSULIN PEN NEEDLE 31G X 5 MM MISC: 3 refills | Status: DC

## 2018-04-15 MED ORDER — CLOTRIMAZOLE-BETAMETHASONE 1-0.05 % EX CREA: 1.0000 "application " | TOPICAL_CREAM | Freq: Two times a day (BID) | CUTANEOUS | 1 refills | Status: DC

## 2018-04-15 NOTE — Patient Instructions (Addendum)
Limit really hard exertion.   If you have any other chest pain or trouble, then let cardiology know.   Cut the iron back to MWF.  Check your dose of losartan.  Continue as is, but update me if it is 100mg  in stread of 50.   Schedule a medicare annual visit in about 3 months, labs ahead of time.   Take care.  Glad to see you.

## 2018-04-15 NOTE — Progress Notes (Signed)
Diabetes:  Using medications without difficulties:yes Hypoglycemic episodes:no Hyperglycemic episodes:no Feet problems: no Blood Sugars averaging: 110-150 usually.  eye exam within last year:  He has f/u pending for 05/02/18.   Labs discussed with patient.  Hypertension:    Using medication without problems or lightheadedness:  yes Chest pain with exertion:no- he had one episode of chest tightness with sig exertion while working in the yard.  This was a single event, a few weeks ago.  No other similar sx.   See avs.  He isn't having troubles.   Edema:no Short of breath: likely age expected, with exertion BP similar on home checks.   Lotrisone used prn w/o ADE.  D/w pt.  Refill sent.   Anemia hx.  Still on eliquis.  Still on iron.  Labs d/w pt.  D/w pt about taking iron MWF.   It is likely safer to continue with relatively low dose iron replacement in the setting of anticoagulation instead of putting him through more invasive studies that are likely riskier, assuming his blood counts are stable.  He agrees.  Cr stable, d/w pt. we will send a copy of his labs to the renal clinic.  No knee pain, no fevers. Still on amoxil.  He has ID f/u pending.    PMH and SH reviewed  Meds, vitals, and allergies reviewed.   ROS: Per HPI unless specifically indicated in ROS section   GEN: nad, alert and oriented HEENT: mucous membranes moist NECK: supple w/o LA CV: rrr. PULM: ctab, no inc wob ABD: soft, +bs EXT: no edema SKIN: no acute rash  Diabetic foot exam: Normal inspection No skin breakdown No calluses  Dec DP pulses B  Normal sensation to light touch and monofilament Nails normal

## 2018-04-17 NOTE — Assessment & Plan Note (Signed)
No change in meds at this point.  Blood pressure is reasonable.  Update me as needed.  He agrees.  Labs discussed with patient.  I do not want to induce hypotension.

## 2018-04-17 NOTE — Assessment & Plan Note (Signed)
Still on eliquis.  Still on iron.  Labs d/w pt.  D/w pt about taking iron MWF.   It is likely safer to continue with relatively low dose iron replacement in the setting of anticoagulation instead of putting him through more invasive studies that are likely riskier, assuming his blood counts are stable.  He agrees.

## 2018-04-17 NOTE — Assessment & Plan Note (Signed)
No knee pain.  No fevers.  Still on amoxicillin.  He has follow-up with the infectious disease clinic pending.  I would continue as is for now.  He agrees.

## 2018-04-17 NOTE — Assessment & Plan Note (Signed)
Creatinine is stable compared to previous historic values.  Continue as is.  We will update the renal clinic.  I appreciate the help of all involved.

## 2018-04-17 NOTE — Assessment & Plan Note (Addendum)
He is working on diet.  A1c is reasonable.  I do not want to induce hypoglycemia.  Routine cautions given.  Recheck periodically.  He agrees. >25 minutes spent in face to face time with patient, >50% spent in counselling or coordination of care.

## 2018-04-18 ENCOUNTER — Other Ambulatory Visit: Payer: Self-pay | Admitting: Family Medicine

## 2018-05-02 ENCOUNTER — Ambulatory Visit (INDEPENDENT_AMBULATORY_CARE_PROVIDER_SITE_OTHER): Payer: Medicare Other

## 2018-05-02 DIAGNOSIS — I442 Atrioventricular block, complete: Secondary | ICD-10-CM

## 2018-05-03 NOTE — Progress Notes (Signed)
Remote pacemaker transmission.   

## 2018-05-04 ENCOUNTER — Other Ambulatory Visit: Payer: Self-pay | Admitting: Family Medicine

## 2018-05-04 DIAGNOSIS — K5900 Constipation, unspecified: Secondary | ICD-10-CM

## 2018-05-04 NOTE — Telephone Encounter (Signed)
Electronic refill request [facility] Last office visit 04/15/18 Last refill 11/21/17 #90/1

## 2018-05-04 NOTE — Telephone Encounter (Signed)
Sent. Thanks.   

## 2018-05-05 ENCOUNTER — Other Ambulatory Visit: Payer: Self-pay | Admitting: Family Medicine

## 2018-05-13 ENCOUNTER — Ambulatory Visit: Payer: Medicare Other | Admitting: Cardiology

## 2018-06-14 ENCOUNTER — Ambulatory Visit: Payer: Medicare Other | Admitting: Internal Medicine

## 2018-06-18 LAB — CUP PACEART REMOTE DEVICE CHECK
Battery Voltage: 3.01 V
Brady Statistic AP VP Percent: 8.5 %
Brady Statistic AS VP Percent: 91 %
Brady Statistic RA Percent Paced: 8.3 %
Brady Statistic RV Percent Paced: 99 %
Implantable Lead Implant Date: 20160321
Lead Channel Impedance Value: 410 Ohm
Lead Channel Pacing Threshold Amplitude: 1.75 V
Lead Channel Pacing Threshold Pulse Width: 0.8 ms
Lead Channel Sensing Intrinsic Amplitude: 10.1 mV
Lead Channel Setting Pacing Amplitude: 2.5 V
Lead Channel Setting Pacing Amplitude: 3 V
Lead Channel Setting Sensing Sensitivity: 8 mV
MDC IDC LEAD IMPLANT DT: 20160321
MDC IDC LEAD LOCATION: 753859
MDC IDC LEAD LOCATION: 753860
MDC IDC MSMT BATTERY REMAINING LONGEVITY: 90 mo
MDC IDC MSMT BATTERY REMAINING PERCENTAGE: 95.5 %
MDC IDC MSMT LEADCHNL RA IMPEDANCE VALUE: 440 Ohm
MDC IDC MSMT LEADCHNL RA SENSING INTR AMPL: 1.6 mV
MDC IDC MSMT LEADCHNL RV PACING THRESHOLD AMPLITUDE: 0.75 V
MDC IDC MSMT LEADCHNL RV PACING THRESHOLD PULSEWIDTH: 0.5 ms
MDC IDC PG IMPLANT DT: 20160321
MDC IDC PG SERIAL: 7734710
MDC IDC SESS DTM: 20191209070014
MDC IDC SET LEADCHNL RV PACING PULSEWIDTH: 0.5 ms
MDC IDC STAT BRADY AP VS PERCENT: 1 %
MDC IDC STAT BRADY AS VS PERCENT: 1 %

## 2018-06-20 ENCOUNTER — Encounter: Payer: Self-pay | Admitting: Internal Medicine

## 2018-06-20 ENCOUNTER — Ambulatory Visit: Payer: Medicare Other | Admitting: Internal Medicine

## 2018-06-20 DIAGNOSIS — Z792 Long term (current) use of antibiotics: Secondary | ICD-10-CM | POA: Diagnosis not present

## 2018-06-20 DIAGNOSIS — T8454XD Infection and inflammatory reaction due to internal left knee prosthesis, subsequent encounter: Secondary | ICD-10-CM | POA: Diagnosis not present

## 2018-06-20 DIAGNOSIS — Z888 Allergy status to other drugs, medicaments and biological substances status: Secondary | ICD-10-CM

## 2018-06-20 DIAGNOSIS — Z87891 Personal history of nicotine dependence: Secondary | ICD-10-CM | POA: Diagnosis not present

## 2018-06-20 MED ORDER — AMOXICILLIN 500 MG PO CAPS: 500.0000 mg | ORAL_CAPSULE | Freq: Two times a day (BID) | ORAL | 3 refills | Status: DC

## 2018-06-20 NOTE — Progress Notes (Signed)
[facility] for [facility]ious [facility]  Patient Active Problem List   Diagnosis Date Noted  . Infection of prosthetic left knee joint ([place]) 07/29/2015    Priority: High  . Constipation   . Low back pain 02/02/2018  . Bruise 12/19/2017  . Coronary artery disease 07/29/2015  . Stroke ([place]) 12/24/2014  . Double vision 12/23/2014  . Pacemaker 11/16/2014  . Anemia 09/27/2014  . Gastrointestinal hemorrhage associated with peptic ulcer 09/07/2014  . Atrial fibrillation-CHADS VASC2 = 5 (age, HTN, DM, Vasc) 08/13/2014  . Chest pain 08/11/2014  . Complete heart block ([place]) 08/11/2014  . S/P CABG x 5 1989. Low risk Myoview 2012 08/11/2014  . Syncope 08/11/2014  . Cough 06/14/2014  . Left foot pain 02/23/2014  . Gouty arthropathy 08/09/2013  . Chronic radicular lumbar pain 12/15/2012  . PVD- s/p bilat CEA- CA dopplers OK Jan 2015 10/06/2012  . Insomnia 09/30/2011  . OTHER SPECIFIED SITES OF SPRAINS AND STRAINS 04/25/2010  . DIVERTICULITIS OF COLON 02/21/2010  . TEMPOROMANDIBULAR JOINT DISORDER 11/22/2009  . DM (diabetes mellitus), type 2 with renal complications ([place]) 37/16/9678  . Chronic kidney disease, stage III (moderate) ([place]) 07/26/2008  . CHRONIC PROSTATITIS 07/26/2008  . ADVEF, DRUG/MEDICINAL/BIOLOGICAL SUBST NOS 03/16/2007  . Dyslipidemia 11/15/2006  . Essential hypertension 11/15/2006  . GERD 11/15/2006  . Osteoarthritis 11/15/2006    Patient's Medications  New Prescriptions   No medications on file  Previous Medications   AMLODIPINE (NORVASC) 2.5 MG TABLET    Take 1 tablet (2.5 mg total) by mouth daily.   CLOTRIMAZOLE-BETAMETHASONE (LOTRISONE) CREAM    Apply 1 application topically 2 (two) times daily.   DILTIAZEM (CARDIZEM CD) 360 MG 24 HR CAPSULE    TAKE ONE CAPSULE BY MOUTH DAILY   ELIQUIS 2.5 MG TABS TABLET    TAKE 1 TABLET BY MOUTH TWICE A DAY   FERROUS SULFATE 325 (65 FE) MG TABLET    Take 1 pill on MWF   GLIPIZIDE (GLUCOTROL) 5 MG TABLET    TAKE 2  TABLETS BY MOUTH EVERY MORNING BEFORE BREAKFAST AND 1 TABLET BEFORE SUPPER   INSULIN GLARGINE (BASAGLAR KWIKPEN) 100 UNIT/ML SOPN    INJECT 0.05 MLS (5 UNITS TOTAL) INTO THE SKIN AT BEDTIME   INSULIN PEN NEEDLE (LEADER UNIFINE PENTIPS) 31G X 5 MM MISC    USE DAILY WITH INSULIN PEN   JANUVIA 50 MG TABLET    TAKE 1 TABLET BY MOUTH DAILY   LOSARTAN (COZAAR) 50 MG TABLET    Take 50 mg by mouth daily.   LUBIPROSTONE (AMITIZA) 8 MCG CAPSULE    TAKE ONE CAPSULE BY MOUTH EACH MORNING   OMEPRAZOLE (PRILOSEC) 40 MG CAPSULE    Take 1 capsule (40 mg total) by mouth daily.   OPHTHALMIC IRRIGATION SOLUTION (EYE WASH) 99.05 % SOLN    Place 1 drop into both eyes 2 (two) times daily.   ROSUVASTATIN (CRESTOR) 20 MG TABLET    TAKE 1 TABLET BY MOUTH DAILY  Modified Medications   Modified Medication Previous Medication   AMOXICILLIN (AMOXIL) 500 MG CAPSULE amoxicillin (AMOXIL) 500 MG capsule      Take 1 capsule (500 mg total) by mouth 2 (two) times daily.    Take 1 capsule (500 mg total) by mouth 2 (two) times daily.  Discontinued Medications   No medications on file    Subjective: Dean Murphy is in for Dean Murphy routine follow-up visit.  Dean Murphy is accompanied by Dean Murphy wife.  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  Dean Murphy has had no problems tolerating Dean Murphy amoxicillin.  Last week Dean Murphy developed some stiffness and mild discomfort in Dean Murphy left knee.  Dean Murphy does not recall doing any unusual activities or injuring Dean Murphy knee prior to onset of the stiffness.  Dean Murphy took Aleve for 2 days and the stiffness and discomfort resolved.  Dean Murphy has felt perfectly well since that time.  Review of Systems: Review of Systems  Constitutional: Negative for chills, diaphoresis and fever.  Gastrointestinal: Negative for abdominal pain, diarrhea, nausea and vomiting.  Musculoskeletal: Positive for joint pain.    Past Medical History:  Diagnosis Date  . Anemia     . Arthritis   . CAD (coronary artery disease)    a. CABG 1989, b. Myoview low risk 2012.  . Carotid artery occlusion    a. Duplex 10/2014: patent R/L CEA with mild hyperplasia in right surgical bulb - followed by vascular.  . CHB (complete heart block) [facility]) March 2016   a. s/p STJ dual chamber pacemaker 07/2014.  [name] Chronic diastolic CHF (congestive heart failure) ([place])    a. Dx 12/2954 - acute diastolic CHF in the setting of CHB.  [name] Chronic kidney disease, stage IV (severe) ([facility])    stage III/IV as of 2015, [name]  . CKD (chronic kidney disease), stage IV ([place])   . Colon polyps   . Complication of anesthesia   . Constipation   . CVA (cerebral infarction)   . Diabetes mellitus    type II  . Diverticulosis   . Dupuytren's disease    finger right hand contracted  . Essential hypertension   . GERD (gastroesophageal reflux disease)   . Hemorrhoids   . Hyperlipidemia   . Myocardial infarction [facility]) 1976  and Mar. 19, 2016  . Orthostasis   . Osteoporosis   . PAF (paroxysmal atrial fibrillation) [facility]) March 2016  . PONV (postoperative nausea and vomiting)   . Presence of permanent cardiac pacemaker   . Shortness of breath dyspnea    due to pain    Social History   Tobacco Use  . Smoking status: Former Smoker    Packs/day: 1.00    Years: 25.00    Pack years: 25.00    Types: Cigarettes    Last attempt to quit: 01/21/1973    Years since quitting: 45.4  . Smokeless tobacco: Never Used  . Tobacco comment: began smoking in high school, quit age 76  Substance Use Topics  . Alcohol use: Yes    Alcohol/week: 0.0 standard drinks    Comment: 1-2 per week  . Drug use: No    Family History  Problem Relation Age of Onset  . Heart disease Mother        Before age 71  . Diabetes Mother   . Varicose Veins Mother   . Heart attack Mother   . Heart attack Father   . Heart disease Father        After age 71  . Hypertension Father   . Heart disease Brother         Before age 62  . Hypertension Brother   . Heart attack Brother   . Diabetes Son   . Hypertension Son   . Hypertension Son   . Diabetes Son   . Diabetes Unknown   . Cancer Unknown   . Colon cancer Neg Hx  Allergies  Allergen Reactions  . Zoledronic Acid Other (See Comments)    Stopped 2015 due to Creatine  Other reaction(s): Unknown Stopped 2015 due to Cr    Objective: Vitals:   06/20/18 1547  BP: (!) 186/68  Pulse: 65  Temp: (!) 97.5 F (36.4 C)  Weight: 209 lb (94.8 kg)   Body mass index is 30.86 kg/m.  Physical Exam  Constitutional: No distress.  Musculoskeletal:     Comments: Dean Murphy left knee incision is well-healed.  There is no unusual swelling, redness, warmth or pain with range of motion.  Neurological: Dean Murphy is alert.  Skin: No rash noted.  Psychiatric: Mood and affect normal.    Lab Results    Problem List Items Addressed This Visit      High   Infection of prosthetic left knee joint ([place])    Dean Murphy has no evidence of active infection of Dean Murphy left prosthetic knee.  I have talked to him and Dean Murphy wife again today about treatment options including stopping amoxicillin now or continuing chronic suppression.  Dean Murphy favors the latter.  Dean Murphy will follow-up in 1 year.  Dean Murphy knows to contact me if Dean Murphy has severe diarrhea or other problems that might be related to amoxicillin.      Relevant Medications   amoxicillin (AMOXIL) 500 MG capsule       Michel Bickers, MD [facility] [place] [phone] pager   [phone] cell 06/20/2018, 4:04 PM

## 2018-06-20 NOTE — Assessment & Plan Note (Signed)
He has no evidence of active infection of his left prosthetic knee.  I have talked to him and his wife again today about treatment options including stopping amoxicillin now or continuing chronic suppression.  He favors the latter.  He will follow-up in 1 year.  He knows to contact me if he has severe diarrhea or other problems that might be related to amoxicillin.

## 2018-07-07 ENCOUNTER — Other Ambulatory Visit: Payer: Self-pay | Admitting: Family Medicine

## 2018-07-07 ENCOUNTER — Ambulatory Visit (INDEPENDENT_AMBULATORY_CARE_PROVIDER_SITE_OTHER): Payer: Medicare Other

## 2018-07-07 VITALS — BP 138/86 | HR 70 | Temp 97.5°F | Ht 69.0 in | Wt 208.6 lb

## 2018-07-07 DIAGNOSIS — E1122 Type 2 diabetes mellitus with diabetic chronic kidney disease: Secondary | ICD-10-CM

## 2018-07-07 DIAGNOSIS — Z0000 Encounter for general adult medical examination without abnormal findings: Secondary | ICD-10-CM

## 2018-07-07 DIAGNOSIS — D508 Other iron deficiency anemias: Secondary | ICD-10-CM | POA: Diagnosis not present

## 2018-07-07 LAB — CBC WITH DIFFERENTIAL/PLATELET
BASOS PCT: 0.8 % (ref 0.0–3.0)
Basophils Absolute: 0.1 10*3/uL (ref 0.0–0.1)
EOS ABS: 0.2 10*3/uL (ref 0.0–0.7)
EOS PCT: 1.9 % (ref 0.0–5.0)
HEMATOCRIT: 44.5 % (ref 39.0–52.0)
HEMOGLOBIN: 15.1 g/dL (ref 13.0–17.0)
LYMPHS PCT: 25.7 % (ref 12.0–46.0)
Lymphs Abs: 2.5 10*3/uL (ref 0.7–4.0)
MCHC: 33.9 g/dL (ref 30.0–36.0)
MCV: 89.5 fl (ref 78.0–100.0)
Monocytes Absolute: 0.6 10*3/uL (ref 0.1–1.0)
Monocytes Relative: 6.5 % (ref 3.0–12.0)
Neutro Abs: 6.3 10*3/uL (ref 1.4–7.7)
Neutrophils Relative %: 65.1 % (ref 43.0–77.0)
Platelets: 178 10*3/uL (ref 150.0–400.0)
RBC: 4.97 Mil/uL (ref 4.22–5.81)
RDW: 12.9 % (ref 11.5–15.5)
WBC: 9.7 10*3/uL (ref 4.0–10.5)

## 2018-07-07 LAB — HEMOGLOBIN A1C: HEMOGLOBIN A1C: 8.7 % — AB (ref 4.6–6.5)

## 2018-07-07 LAB — BASIC METABOLIC PANEL
BUN: 21 mg/dL (ref 6–23)
CHLORIDE: 107 meq/L (ref 96–112)
CO2: 30 mEq/L (ref 19–32)
Calcium: 9.1 mg/dL (ref 8.4–10.5)
Creatinine, Ser: 1.77 mg/dL — ABNORMAL HIGH (ref 0.40–1.50)
GFR: 36.38 mL/min — ABNORMAL LOW (ref >60.00)
Glucose, Bld: 165 mg/dL — ABNORMAL HIGH (ref 70–99)
POTASSIUM: 4.9 meq/L (ref 3.5–5.1)
Sodium: 145 mEq/L (ref 135–145)

## 2018-07-07 LAB — IBC PANEL
Iron: 69 ug/dL (ref 42–165)
SATURATION RATIOS: 21.2 % (ref 20.0–50.0)
Transferrin: 233 mg/dL (ref 212.0–360.0)

## 2018-07-07 NOTE — Patient Instructions (Addendum)
Dean Murphy , Thank you for taking time to come for your Medicare Wellness Visit. I appreciate your ongoing commitment to your health goals. Please review the following plan we discussed and let me know if I can assist you in the future.   These are the goals we discussed: Goals    . Increase water intake     Starting 07/07/2018, I will continue to drink at least 8 oz water with each meal daily.        This is a list of the screening recommended for you and due dates:  Health Maintenance  Topic Date Due  . Tetanus Vaccine  07/27/2018  . Hemoglobin A1C  01/05/2019  . Eye exam for diabetics  02/23/2019  . Complete foot exam   04/16/2019  . Flu Shot  Completed  . Pneumonia vaccines  Completed   Preventive Care for Adults  A healthy lifestyle and preventive care can promote health and wellness. Preventive health guidelines for adults include the following key practices.  . A routine yearly physical is a good way to check with your health care provider about your health and preventive screening. It is a chance to share any concerns and updates on your health and to receive a thorough exam.  . Visit your dentist for a routine exam and preventive care every 6 months. Brush your teeth twice a day and floss once a day. Good oral hygiene prevents tooth decay and gum disease.  . The frequency of eye exams is based on your age, health, family medical history, use  of contact lenses, and other factors. Follow your health care provider's recommendations for frequency of eye exams.  . Eat a healthy diet. Foods like vegetables, fruits, whole grains, low-fat dairy products, and lean protein foods contain the nutrients you need without too many calories. Decrease your intake of foods high in solid fats, added sugars, and salt. Eat the right amount of calories for you. Get information about a proper diet from your health care provider, if necessary.  . Regular physical exercise is one of the most  important things you can do for your health. Most adults should get at least 150 minutes of moderate-intensity exercise (any activity that increases your heart rate and causes you to sweat) each week. In addition, most adults need muscle-strengthening exercises on 2 or more days a week.  Silver Sneakers may be a benefit available to you. To determine eligibility, you may visit the website: www.silversneakers.com or contact program at 631-884-9294 Mon-Fri between 8AM-8PM.   . Maintain a healthy weight. The body mass index (BMI) is a screening tool to identify possible weight problems. It provides an estimate of body fat based on height and weight. Your health care provider can find your BMI and can help you achieve or maintain a healthy weight.   For adults 20 years and older: ? A BMI below 18.5 is considered underweight. ? A BMI of 18.5 to 24.9 is normal. ? A BMI of 25 to 29.9 is considered overweight. ? A BMI of 30 and above is considered obese.   . Maintain normal blood lipids and cholesterol levels by exercising and minimizing your intake of saturated fat. Eat a balanced diet with plenty of fruit and vegetables. Blood tests for lipids and cholesterol should begin at age 32 and be repeated every 5 years. If your lipid or cholesterol levels are high, you are over 50, or you are at high risk for heart disease, you may need your  cholesterol levels checked more frequently. Ongoing high lipid and cholesterol levels should be treated with medicines if diet and exercise are not working.  . If you smoke, find out from your health care provider how to quit. If you do not use tobacco, please do not start.  . If you choose to drink alcohol, please do not consume more than 2 drinks per day. One drink is considered to be 12 ounces (355 mL) of beer, 5 ounces (148 mL) of wine, or 1.5 ounces (44 mL) of liquor.  . If you are 63-5 years old, ask your health care provider if you should take aspirin to prevent  strokes.  . Use sunscreen. Apply sunscreen liberally and repeatedly throughout the day. You should seek shade when your shadow is shorter than you. Protect yourself by wearing long sleeves, pants, a wide-brimmed hat, and sunglasses year round, whenever you are outdoors.  . Once a month, do a whole body skin exam, using a mirror to look at the skin on your back. Tell your health care provider of new moles, moles that have irregular borders, moles that are larger than a pencil eraser, or moles that have changed in shape or color.

## 2018-07-07 NOTE — Progress Notes (Signed)
Subjective:   Dean Murphy is a 85 y.o. male who presents for Medicare Annual/Subsequent preventive examination.  Review of Systems:  N/A Cardiac Risk Factors include: advanced age (>72men, >90 women);diabetes mellitus;dyslipidemia;hypertension;male gender     Objective:    Vitals: BP 138/86 (BP Location: Right Arm, Patient Position: Sitting, Cuff Size: Normal)   Pulse 70   Temp (!) 97.5 F (36.4 C) (Oral)   Ht 5\' 9"  (1.753 m) Comment: shoes  Wt 208 lb 9 oz (94.6 kg)   SpO2 95%   BMI 30.80 kg/m   Body mass index is 30.8 kg/m.  Advanced Directives 07/07/2018 11/05/2016 06/04/2016 10/31/2015 08/02/2015 07/26/2015 10/25/2014  Does Patient Have a Medical Advance Directive? Yes Yes Yes Yes No No No  Type of Advance Directive Living will;Healthcare Power of [place] will - - -  Does patient want to make changes to medical advance directive? - - - No - Patient declined - - -  Copy of [place] in Chart? No - copy requested - No - copy requested No - copy requested - No - copy requested -  Would patient like information on creating a medical advance directive? No - Patient declined - - - No - patient declined information - Yes - Scientist, clinical (histocompatibility and immunogenetics) given    Tobacco Social History   Tobacco Use  Smoking Status Former Smoker  . Packs/day: 1.00  . Years: 25.00  . Pack years: 25.00  . Types: Cigarettes  . Last attempt to quit: 01/21/1973  . Years since quitting: 45.4  Smokeless Tobacco Never Used  Tobacco Comment   began smoking in high school, quit age 31     Counseling given: No Comment: began smoking in high school, quit age 71   Clinical Intake:  Pre-visit preparation completed: Yes  Pain : No/denies pain Pain Score: 0-No pain     Nutritional Risks: None Diabetes: Yes CBG done?: No Did pt. bring in CBG monitor from home?: No  How often do you need to have someone help you when you read  instructions, pamphlets, or other written materials from your doctor or pharmacy?: 1 - Never What is the last grade level you completed in school?: 12th grade  Interpreter Needed?: No  Comments: pt lives with spouse Information entered by :: LPinson, LPN  Past Medical History:  Diagnosis Date  . Anemia   . Arthritis   . CAD (coronary artery disease)    a. CABG 1989, b. Myoview low risk 2012.  . Carotid artery occlusion    a. Duplex 10/2014: patent R/L CEA with mild hyperplasia in right surgical bulb - followed by vascular.  . CHB (complete heart block) [facility]) March 2016   a. s/p STJ dual chamber pacemaker 07/2014.  [name] Chronic diastolic CHF (congestive heart failure) ([place])    a. Dx 10/9483 - acute diastolic CHF in the setting of CHB.  [name] Chronic kidney disease, stage IV (severe) (HCC)    stage III/IV as of 2015, [name]  . CKD (chronic kidney disease), stage IV ([place])   . Colon polyps   . Complication of anesthesia   . Constipation   . CVA (cerebral infarction)   . Diabetes mellitus    type II  . Diverticulosis   . Dupuytren's disease    finger right hand contracted  . Essential hypertension   . GERD (gastroesophageal reflux disease)   . Hemorrhoids   . Hyperlipidemia   .  Myocardial infarction [facility]) 1976  and Mar. 19, 2016  . Orthostasis   . Osteoporosis   . PAF (paroxysmal atrial fibrillation) [facility]) March 2016  . PONV (postoperative nausea and vomiting)   . Presence of permanent cardiac pacemaker   . Shortness of breath dyspnea    due to pain   Past Surgical History:  Procedure Laterality Date  . CARDIAC CATHETERIZATION  08/11/2014   Procedure: TEMPORARY PACEMAKER;  Surgeon: [name], MD;  Location[facility] CATH LAB;  Service: Cardiovascular;;  . CAROTID ENDARTERECTOMY  12/31/10   Right  . CAROTID ENDARTERECTOMY  02/09/11   left  . CHOLECYSTECTOMY    . CORONARY ARTERY BYPASS GRAFT  1989  . EYE SURGERY Bilateral    Cataract with implants  . I&D KNEE WITH  POLY EXCHANGE Left 07/29/2015   Procedure: IRRIGATION AND DEBRIDEMENT LEFT KNEE WITH POLY EXCHANGE;  Surgeon: Rod Can, MD;  Location: [place];  Service: Orthopedics;  Laterality: Left;  . IR GENERIC HISTORICAL  03/04/2016   IR US GUIDE VASC ACCESS RIGHT 03/04/2016 Marybelle Killings, MD [facility]-INTERV RAD  . IR GENERIC HISTORICAL  03/04/2016   IR FLUORO GUIDE CV LINE RIGHT 03/04/2016 Marybelle Killings, MD [facility]-INTERV RAD  . JOINT REPLACEMENT     bilat. knees  . PERMANENT PACEMAKER INSERTION N/A 08/13/2014   STJ dual chamber pacemaker implanted by [name] for CHB  . TONSILLECTOMY    . TOTAL KNEE ARTHROPLASTY     bilateral   Family History  Problem Relation Age of Onset  . Heart disease Mother        Before age 71  . Diabetes Mother   . Varicose Veins Mother   . Heart attack Mother   . Heart attack Father   . Heart disease Father        After age 8  . Hypertension Father   . Heart disease Brother        Before age 10  . Hypertension Brother   . Heart attack Brother   . Diabetes Son   . Hypertension Son   . Hypertension Son   . Diabetes Son   . Diabetes Other   . Cancer Other   . Colon cancer Neg Hx    Social History   Socioeconomic History  . Marital status: Married    Spouse name: Not on file  . Number of children: Not on file  . Years of education: Not on file  . Highest education level: Not on file  Occupational History  . Occupation: retired    Fish farm manager: RETIRED  Social Needs  . Financial resource strain: Not on file  . Food insecurity:    Worry: Not on file    Inability: Not on file  . Transportation needs:    Medical: Not on file    Non-medical: Not on file  Tobacco Use  . Smoking status: Former Smoker    Packs/day: 1.00    Years: 25.00    Pack years: 25.00    Types: Cigarettes    Last attempt to quit: 01/21/1973    Years since quitting: 45.4  . Smokeless tobacco: Never Used  . Tobacco comment: began smoking in high school, quit age 11  Substance and Sexual  Activity  . Alcohol use: Yes    Alcohol/week: 0.0 standard drinks    Comment: wine occ  . Drug use: No  . Sexual activity: Never  Lifestyle  . Physical activity:    Days per week: Not on file  Minutes per session: Not on file  . Stress: Not on file  Relationships  . Social connections:    Talks on phone: Not on file    Gets together: Not on file    Attends religious service: Not on file    Active member of club or organization: Not on file    Attends meetings of clubs or organizations: Not on file    Relationship status: Not on file  Other Topics Concern  . 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  Remarried 1998.     3 sons    Outpatient Encounter Medications as of 07/07/2018  Medication Sig  . amLODipine (NORVASC) 2.5 MG tablet Take 1 tablet (2.5 mg total) by mouth daily.  [name] amoxicillin (AMOXIL) 500 MG capsule Take 1 capsule (500 mg total) by mouth 2 (two) times daily.  . clotrimazole-betamethasone (LOTRISONE) cream Apply 1 application topically 2 (two) times daily.  [name] diltiazem (CARDIZEM CD) 360 MG 24 hr capsule TAKE ONE CAPSULE BY MOUTH DAILY  . ELIQUIS 2.5 MG TABS tablet TAKE 1 TABLET BY MOUTH TWICE A DAY  . ferrous sulfate 325 (65 FE) MG tablet Take 1 pill on MWF  . glipiZIDE (GLUCOTROL) 5 MG tablet TAKE 2 TABLETS BY MOUTH EVERY MORNING BEFORE BREAKFAST AND 1 TABLET BEFORE SUPPER  . Insulin Glargine (BASAGLAR KWIKPEN) 100 UNIT/ML SOPN INJECT 0.05 MLS (5 UNITS TOTAL) INTO THE SKIN AT BEDTIME  . Insulin Pen Needle (LEADER UNIFINE PENTIPS) 31G X 5 MM MISC USE DAILY WITH INSULIN PEN  . JANUVIA 50 MG tablet TAKE 1 TABLET BY MOUTH DAILY  . losartan (COZAAR) 50 MG tablet Take 50 mg by mouth daily.  [name] lubiprostone (AMITIZA) 8 MCG capsule TAKE ONE CAPSULE BY MOUTH EACH MORNING  . omeprazole (PRILOSEC) 40 MG capsule Take 1 capsule (40 mg total) by mouth daily.  [name] Ophthalmic Irrigation Solution (EYE  WASH) 99.05 % SOLN Place 1 drop into both eyes 2 (two) times daily.  . rosuvastatin (CRESTOR) 20 MG tablet TAKE 1 TABLET BY MOUTH DAILY   No facility-administered encounter medications on file as of 07/07/2018.     Activities of Daily Living In your present state of health, do you have any difficulty performing the following activities: 07/07/2018  Hearing? N  Vision? N  Difficulty concentrating or making decisions? N  Walking or climbing stairs? Y  Dressing or bathing? N  Doing errands, shopping? N  Preparing Food and eating ? N  Using the Toilet? N  In the past six months, have you accidently leaked urine? N  Do you have problems with loss of bowel control? N  Managing your Medications? N  Managing your Finances? N  Housekeeping or managing your Housekeeping? N  Some recent data might be hidden    Patient Care Team: Tonia Ghent, MD as PCP - General (Family Medicine) Murlean Iba, MD (Internal Medicine) Katy Apo, MD as Consulting Physician (Ophthalmology) Dorothy Spark, MD as Consulting Physician (Cardiology) Eula Listen, DDS as Referring Physician (Dentistry) Danella Sensing, MD as Consulting Physician (Dermatology) Rod Can, MD as Consulting Physician (Orthopedic Surgery) Evans Lance, MD as Consulting Physician (Cardiology) Michel Bickers, MD as Consulting Physician (Infectious Diseases)   Assessment:   This is a routine wellness examination for Archibald.   Hearing Screening   125Hz  250Hz  500Hz  1000Hz  2000Hz  3000Hz  4000Hz  6000Hz  8000Hz   Right ear:   40  40 40  0    Left ear:   40 40 40  0    Vision Screening Comments: Vision exam in Oct 2019 with Dr. Katy Apo   Exercise Activities and Dietary recommendations Current Exercise Habits: The patient does not participate in regular exercise at present, Exercise limited by: None identified  Goals    . Increase water intake     Starting 07/07/2018, I will continue to drink at least 8 oz water with  each meal daily.        Fall Risk Fall Risk  07/07/2018 06/10/2017 12/08/2016 06/09/2016 06/04/2016  Falls in the past year? 0 No Yes No Yes  Comment - - - - pt reports 2 accidental falls without injury outside of home  Number falls in past yr: - - 1 - 2 or more  Injury with Fall? - - No - No  Follow up - - Falls prevention discussed - -   Depression Screen PHQ 2/9 Scores 07/07/2018 06/10/2017 12/08/2016 06/09/2016  PHQ - 2 Score 0 0 0 0  PHQ- 9 Score 0 - - -    Cognitive Function MMSE - Mini Mental State Exam 07/07/2018 06/04/2016  Orientation to time 5 5  Orientation to Place 5 5  Registration 3 3  Attention/ Calculation 0 0  Recall 3 3  Language- name 2 objects 0 0  Language- repeat 1 1  Language- follow 3 step command 3 3  Language- read & follow direction 0 0  Write a sentence 0 0  Copy design 0 0  Total score 20 20     PLEASE NOTE: A Mini-Cog screen was completed. Maximum score is 20. A value of 0 denotes this part of [place] MMSE was not completed or the patient failed this part of the Mini-Cog screening.   Mini-Cog Screening Orientation to Time - Max 5 pts Orientation to Place - Max 5 pts Registration - Max 3 pts Recall - Max 3 pts Language Repeat - Max 1 pts Language Follow 3 Step Command - Max 3 pts     Immunization History  Administered Date(s) Administered  . Influenza Split 02/03/2012  . Influenza Whole 03/16/2007, 01/23/2010  . Influenza, High Dose Seasonal PF 03/27/2013  . Influenza,inj,Quad PF,6+ Mos 03/15/2014, 03/15/2015, 02/27/2016  . Influenza-Unspecified 03/19/2017, 02/17/2018  . Pneumococcal Conjugate-13 06/24/2015  . Pneumococcal Polysaccharide-23 09/24/2009  . Td 07/26/2008    Screening Tests Health Maintenance  Topic Date Due  . TETANUS/TDAP  07/27/2018  . HEMOGLOBIN A1C  01/05/2019  . OPHTHALMOLOGY EXAM  02/23/2019  . FOOT EXAM  04/16/2019  . INFLUENZA VACCINE  Completed  . PNA vac Low Risk Adult  Completed      Plan:     I have  personally reviewed, addressed, and noted the following in the patient's chart:  A. Medical and social history B. Use of alcohol, tobacco or illicit drugs  C. Current medications and supplements D. Functional ability and status E.  Nutritional status F.  Physical activity G. Advance directives H. List of other physicians I.  Hospitalizations, surgeries, and ER visits in previous 12 months J.  [place] to include hearing, vision, cognitive, depression L. Referrals and appointments - none  In addition, I have reviewed and discussed with patient certain preventive protocols, quality metrics, and best practice recommendations. A written personalized care plan for preventive services as well as general preventive health recommendations were provided to patient.  See attached scanned questionnaire for additional information.   Signed,  Lindell Noe, MHA, BS, LPN Health Coach

## 2018-07-07 NOTE — Progress Notes (Signed)
PCP notes:   Health maintenance:  A1C - completed  Abnormal screenings:   Hearing - failed  Hearing Screening   125Hz  250Hz  500Hz  1000Hz  2000Hz  3000Hz  4000Hz  6000Hz  8000Hz   Right ear:   40 40 40  0    Left ear:   40 40 40  0     Patient concerns:   None  Nurse concerns:  None  Next PCP appt:   07/18/18 @ 0945  I reviewed health advisor's note, was available for consultation on the day of service listed in this note, and agree with documentation and plan. Elsie Stain, MD.

## 2018-07-11 ENCOUNTER — Emergency Department: Payer: Medicare Other

## 2018-07-11 ENCOUNTER — Emergency Department
Admission: EM | Admit: 2018-07-11 | Discharge: 2018-07-11 | Disposition: A | Discharge status: Home / Self Care | Payer: Medicare Other | Attending: Emergency Medicine | Admitting: Emergency Medicine

## 2018-07-11 ENCOUNTER — Encounter: Payer: Self-pay | Admitting: Emergency Medicine

## 2018-07-11 ENCOUNTER — Other Ambulatory Visit: Payer: Self-pay

## 2018-07-11 DIAGNOSIS — W19XXXA Unspecified fall, initial encounter: Secondary | ICD-10-CM | POA: Insufficient documentation

## 2018-07-11 DIAGNOSIS — Y92007 Garden or yard of unspecified non-institutional (private) residence as the place of occurrence of the external cause: Secondary | ICD-10-CM | POA: Diagnosis not present

## 2018-07-11 DIAGNOSIS — S2231XA Fracture of one rib, right side, initial encounter for closed fracture: Secondary | ICD-10-CM

## 2018-07-11 DIAGNOSIS — N183 Chronic kidney disease, stage 3 (moderate): Secondary | ICD-10-CM | POA: Insufficient documentation

## 2018-07-11 DIAGNOSIS — Y939 Activity, unspecified: Secondary | ICD-10-CM | POA: Diagnosis not present

## 2018-07-11 DIAGNOSIS — Z87891 Personal history of nicotine dependence: Secondary | ICD-10-CM | POA: Insufficient documentation

## 2018-07-11 DIAGNOSIS — S299XXA Unspecified injury of thorax, initial encounter: Secondary | ICD-10-CM | POA: Diagnosis present

## 2018-07-11 DIAGNOSIS — I2510 Atherosclerotic heart disease of native coronary artery without angina pectoris: Secondary | ICD-10-CM | POA: Diagnosis not present

## 2018-07-11 DIAGNOSIS — Z96653 Presence of artificial knee joint, bilateral: Secondary | ICD-10-CM | POA: Insufficient documentation

## 2018-07-11 DIAGNOSIS — Z950 Presence of cardiac pacemaker: Secondary | ICD-10-CM | POA: Insufficient documentation

## 2018-07-11 DIAGNOSIS — E1122 Type 2 diabetes mellitus with diabetic chronic kidney disease: Secondary | ICD-10-CM | POA: Insufficient documentation

## 2018-07-11 DIAGNOSIS — Z951 Presence of aortocoronary bypass graft: Secondary | ICD-10-CM | POA: Insufficient documentation

## 2018-07-11 DIAGNOSIS — Y999 Unspecified external cause status: Secondary | ICD-10-CM | POA: Diagnosis not present

## 2018-07-11 DIAGNOSIS — I129 Hypertensive chronic kidney disease with stage 1 through stage 4 chronic kidney disease, or unspecified chronic kidney disease: Secondary | ICD-10-CM | POA: Insufficient documentation

## 2018-07-11 DIAGNOSIS — S63501A Unspecified sprain of right wrist, initial encounter: Secondary | ICD-10-CM | POA: Diagnosis not present

## 2018-07-11 MED ORDER — OXYCODONE-ACETAMINOPHEN 5-325 MG PO TABS
2.0000 | ORAL_TABLET | Freq: Once | ORAL | Status: AC
  Administered 2018-07-11: 2 via ORAL
  Filled 2018-07-11: qty 2

## 2018-07-11 MED ORDER — OXYCODONE-ACETAMINOPHEN 5-325 MG PO TABS: 1.0000 | ORAL_TABLET | Freq: Three times a day (TID) | ORAL | 0 refills | Status: DC | PRN

## 2018-07-11 NOTE — ED Provider Notes (Signed)
[facility] Emergency Department Provider Note       Time seen: ----------------------------------------- 4:41 PM on 07/11/2018 -----------------------------------------   I have reviewed the triage vital signs and the nursing notes.  HISTORY   Chief Complaint Fall    HPI Dean Murphy is a 85 y.o. male with a history of anemia, arthritis, coronary artery disease, CHF, chronic kidney disease, CVA, diabetes who presents to the ED for mechanical fall that occurred this morning in the yard.  Patient describes right elbow, right wrist and right rib cage pain from the fall.  He denies any significant head injury, headache or loss of consciousness.  Past Medical History:  Diagnosis Date  . Anemia   . Arthritis   . CAD (coronary artery disease)    a. CABG 1989, b. Myoview low risk 2012.  . Carotid artery occlusion    a. Duplex 10/2014: patent R/L CEA with mild hyperplasia in right surgical bulb - followed by vascular.  . CHB (complete heart block) [facility]) March 2016   a. s/p STJ dual chamber pacemaker 07/2014.  [name] Chronic diastolic CHF (congestive heart failure) ([place])    a. Dx 01/3715 - acute diastolic CHF in the setting of CHB.  [name] Chronic kidney disease, stage IV (severe) (HCC)    stage III/IV as of 2015, [name]  . CKD (chronic kidney disease), stage IV ([place])   . Colon polyps   . Complication of anesthesia   . Constipation   . CVA (cerebral infarction)   . Diabetes mellitus    type II  . Diverticulosis   . Dupuytren's disease    finger right hand contracted  . Essential hypertension   . GERD (gastroesophageal reflux disease)   . Hemorrhoids   . Hyperlipidemia   . Myocardial infarction [facility]) 1976  and Mar. 19, 2016  . Orthostasis   . Osteoporosis   . PAF (paroxysmal atrial fibrillation) [facility]) March 2016  . PONV (postoperative nausea and vomiting)   . Presence of permanent cardiac pacemaker   . Shortness of breath dyspnea    due to pain     Patient Active Problem List   Diagnosis Date Noted  . Constipation   . Low back pain 02/02/2018  . Bruise 12/19/2017  . Infection of prosthetic left knee joint ([place]) 07/29/2015  . Coronary artery disease 07/29/2015  . Stroke ([place]) 12/24/2014  . Double vision 12/23/2014  . Pacemaker 11/16/2014  . Anemia 09/27/2014  . Gastrointestinal hemorrhage associated with peptic ulcer 09/07/2014  . Atrial fibrillation-CHADS VASC2 = 5 (age, HTN, DM, Vasc) 08/13/2014  . Chest pain 08/11/2014  . Complete heart block ([place]) 08/11/2014  . S/P CABG x 5 1989. Low risk Myoview 2012 08/11/2014  . Syncope 08/11/2014  . Cough 06/14/2014  . Left foot pain 02/23/2014  . Gouty arthropathy 08/09/2013  . Chronic radicular lumbar pain 12/15/2012  . PVD- s/p bilat CEA- CA dopplers OK Jan 2015 10/06/2012  . Insomnia 09/30/2011  . OTHER SPECIFIED SITES OF SPRAINS AND STRAINS 04/25/2010  . DIVERTICULITIS OF COLON 02/21/2010  . TEMPOROMANDIBULAR JOINT DISORDER 11/22/2009  . DM (diabetes mellitus), type 2 with renal complications ([place]) 96/78/9381  . Chronic kidney disease, stage III (moderate) ([place]) 07/26/2008  . CHRONIC PROSTATITIS 07/26/2008  . ADVEF, DRUG/MEDICINAL/BIOLOGICAL SUBST NOS 03/16/2007  . Dyslipidemia 11/15/2006  . Essential hypertension 11/15/2006  . GERD 11/15/2006  . Osteoarthritis 11/15/2006    Past Surgical History:  Procedure Laterality Date  . CARDIAC CATHETERIZATION  08/11/2014  Procedure: TEMPORARY PACEMAKER;  Surgeon: [name], MD;  Location: [facility] CATH LAB;  Service: Cardiovascular;;  . CAROTID ENDARTERECTOMY  12/31/10   Right  . CAROTID ENDARTERECTOMY  02/09/11   left  . CHOLECYSTECTOMY    . CORONARY ARTERY BYPASS GRAFT  1989  . EYE SURGERY Bilateral    Cataract with implants  . I&D KNEE WITH POLY EXCHANGE Left 07/29/2015   Procedure: IRRIGATION AND DEBRIDEMENT LEFT KNEE WITH POLY EXCHANGE;  Surgeon: Rod Can, MD;  Location: [place];  Service: Orthopedics;  Laterality:  Left;  . IR GENERIC HISTORICAL  03/04/2016   IR US GUIDE VASC ACCESS RIGHT 03/04/2016 Marybelle Killings, MD [facility]-INTERV RAD  . IR GENERIC HISTORICAL  03/04/2016   IR FLUORO GUIDE CV LINE RIGHT 03/04/2016 Marybelle Killings, MD [facility]-INTER[facility] RAD  . JOINT REPLACEMENT     bilat. knees  . PERMANENT PACEMAKER INSERTION N/A 08/13/2014   STJ dual chamber pacemaker implanted by [name] for CHB  . TONSILLECTOMY    . TOTAL KNEE ARTHROPLASTY     bilateral    Allergies Zoledronic acid  Social History Social History   Tobacco Use  . Smoking status: Former Smoker    Packs/day: 1.00    Years: 25.00    Pack years: 25.00    Types: Cigarettes    Last attempt to quit: 01/21/1973    Years since quitting: 45.4  . Smokeless tobacco: Never Used  . Tobacco comment: began smoking in high school, quit age 45  Substance Use Topics  . Alcohol use: Yes    Alcohol/week: 0.0 standard drinks    Comment: wine occ  . Drug use: No   Review of Systems Constitutional: Negative for fever. Cardiovascular: Negative for chest pain. Respiratory: Negative for shortness of breath. Gastrointestinal: Negative for abdominal pain, vomiting and diarrhea. Musculoskeletal: Positive for right wrist, right elbow and right rib pain Skin: Negative for rash. Neurological: Negative for headaches, focal weakness or numbness.  All systems negative/normal/unremarkable except as stated in the HPI  ____________________________________________   PHYSICAL EXAM:  VITAL SIGNS: ED Triage Vitals  Enc Vitals Group     BP 07/11/18 1339 (!) 126/50     Pulse Rate 07/11/18 1339 83     Resp 07/11/18 1546 (!) 24     Temp 07/11/18 1339 98.3 F (36.8 C)     Temp Source 07/11/18 1339 Oral     SpO2 07/11/18 1339 91 %     Weight 07/11/18 1339 200 lb (90.7 kg)     Height 07/11/18 1339 5\' 9"  (1.753 m)     Head Circumference --      Peak Flow --      Pain Score 07/11/18 1344 8     Pain Loc --      Pain Edu? --      Excl. in [place]? --     Constitutional: Alert and oriented. Well appearing and in no distress. Cardiovascular: Normal rate, regular rhythm. No murmurs, rubs, or gallops. Respiratory: Normal respiratory effort without tachypnea nor retractions. Breath sounds are clear and equal bilaterally. No wheezes/rales/rhonchi. Gastrointestinal: Soft and nontender. Normal bowel sounds Musculoskeletal: Significant pain with range of motion of the right wrist, mild pain with range of motion the right elbow, right inferior axillary rib tenderness Neurologic:  Normal speech and language. No gross focal neurologic deficits are appreciated.  Skin:  Skin is warm, dry and intact. No rash noted. Psychiatric: Mood and affect are normal. Speech and behavior are normal.  ____________________________________________  ED  COURSE:  As part of my medical decision making, I reviewed the following data within the [facility] History obtained from family if available, nursing notes, old chart and ekg, as well as notes from prior ED visits. Patient presented for mechanical fall, we will assess with imaging as indicated at this time.   Procedures ____________________________________________   RADIOLOGY Images were viewed by me  Right rib, right wrist, right elbow x-rays Were negative with the exception of right 11th rib fracture  ____________________________________________   DIFFERENTIAL DIAGNOSIS   Fracture, contusion, sprain, dislocation  FINAL ASSESSMENT AND PLAN  Fall, right rib fracture, wrist sprain   Plan: The patient had presented for a chemical fall.  Patient's imaging did reveal an isolated rib fracture that was nondisplaced.  He was placed in a wrist splint and will be referred to orthopedics for outpatient follow-up.   Laurence Aly, MD    Note: This note was generated in part or whole with voice recognition software. Voice recognition is usually quite accurate but there are transcription  errors that can and very often do occur. I apologize for any typographical errors that were not detected and corrected.     Earleen Newport, MD 07/11/18 [phone]

## 2018-07-11 NOTE — ED Triage Notes (Signed)
PT from PCP with mechanical fall this am in yard. PT c/o RT elbow, wrist, and rib cage pain from fall. Pt ambulatory. No deformity noted. VSS

## 2018-07-18 ENCOUNTER — Ambulatory Visit (INDEPENDENT_AMBULATORY_CARE_PROVIDER_SITE_OTHER)
Admission: RE | Admit: 2018-07-18 | Discharge: 2018-07-18 | Disposition: A | Discharge status: Home / Self Care | Payer: Medicare Other | Source: Ambulatory Visit | Attending: Family Medicine | Admitting: Family Medicine

## 2018-07-18 ENCOUNTER — Ambulatory Visit (INDEPENDENT_AMBULATORY_CARE_PROVIDER_SITE_OTHER): Payer: Medicare Other | Admitting: Family Medicine

## 2018-07-18 ENCOUNTER — Encounter: Payer: Self-pay | Admitting: Family Medicine

## 2018-07-18 VITALS — BP 152/56 | HR 67 | Temp 97.5°F | Ht 69.0 in | Wt 207.4 lb

## 2018-07-18 DIAGNOSIS — E1122 Type 2 diabetes mellitus with diabetic chronic kidney disease: Secondary | ICD-10-CM | POA: Diagnosis not present

## 2018-07-18 DIAGNOSIS — Z0000 Encounter for general adult medical examination without abnormal findings: Secondary | ICD-10-CM

## 2018-07-18 DIAGNOSIS — E785 Hyperlipidemia, unspecified: Secondary | ICD-10-CM

## 2018-07-18 DIAGNOSIS — M79672 Pain in left foot: Secondary | ICD-10-CM

## 2018-07-18 DIAGNOSIS — Z7189 Other specified counseling: Secondary | ICD-10-CM

## 2018-07-18 DIAGNOSIS — M25572 Pain in left ankle and joints of left foot: Secondary | ICD-10-CM | POA: Diagnosis not present

## 2018-07-18 DIAGNOSIS — D508 Other iron deficiency anemias: Secondary | ICD-10-CM

## 2018-07-18 MED ORDER — BASAGLAR KWIKPEN 100 UNIT/ML ~~LOC~~ SOPN: PEN_INJECTOR | SUBCUTANEOUS | Status: DC

## 2018-07-18 MED ORDER — OXYCODONE-ACETAMINOPHEN 5-325 MG PO TABS: 0.5000 | ORAL_TABLET | Freq: Four times a day (QID) | ORAL | Status: DC | PRN

## 2018-07-18 NOTE — Progress Notes (Signed)
Diabetes:  Using medications without difficulties: yes Hypoglycemic episodes:no Hyperglycemic episodes: see below.   Feet problems: no change from prior Blood Sugars averaging: Prev with sugatr 150-160s w/o lows, prior to injury, higher in the meantime.  eye exam within last year: yes Labs d/w pt.   Anemia.  Still on MWF iron.  Labs d/w pt. he is able to tolerate iron and anticoagulation as is.  Elevated Cholesterol: Using medications without problems: yes Muscle aches: not from statin Diet compliance: encouraged.  Exercise:encouraged  R forearm and rib pain with ER eval 07/11/2018.  [place] when working in the yard.  Has used oxycodone for pain but had vomiting with that.  Taking tylenol for pain, with minimal relief.  Most pain in R forearm and R lower ribs. Can take a deep breath.  Splint helps with forearm pain.  He has some left ankle pain.  See exam and plan.  Declined hearing aids.  Wife designated if patient were incapacitated.   Vaccines up to date except for shingrix, out of stock.     PMH and SH reviewed  Meds, vitals, and allergies reviewed.   ROS: Per HPI unless specifically indicated in ROS section   GEN: nad, alert and oriented HEENT: mucous membranes moist NECK: supple w/o LA CV: sounds RRR PULM: ctab, no inc wob, right chest wall tender to palpation as expected ABD: soft, +bs EXT: no edema SKIN: no acute rash L ankle puffy and tender inferior to the L lateral malleolus.  Able to bear weight.

## 2018-07-18 NOTE — Patient Instructions (Signed)
Try taking oxycodone with food, 1/2 tab at a time.   Update me as needed.  Recheck labs at a visit in about 3 months.  Update me as needed in the meantime.   Take care.  Glad to see you.

## 2018-07-21 DIAGNOSIS — Z7189 Other specified counseling: Secondary | ICD-10-CM | POA: Insufficient documentation

## 2018-07-21 DIAGNOSIS — Z0000 Encounter for general adult medical examination without abnormal findings: Secondary | ICD-10-CM | POA: Insufficient documentation

## 2018-07-21 NOTE — Assessment & Plan Note (Signed)
Still on MWF iron.  Labs d/w pt. he is able to tolerate iron and anticoagulation as is.  Continue as is.  He agrees.

## 2018-07-21 NOTE — Assessment & Plan Note (Signed)
Labs d/w pt. A1c is up but pain control is a bigger issue for patient now.  Trying to address his orthopedic issues initially.  He is going to work on diet and exercise as best he can.  We can recheck his sugar periodically.

## 2018-07-21 NOTE — Assessment & Plan Note (Signed)
He has a known rib fracture.  He also has left foot pain.  See notes on imaging regarding avulsion fracture.  He is going to try to use Percocet in the meantime.  Routine cautions given.  He can try a low dose and see if he tolerates that.  He can use an ASO for his ankle if needed.  I am hesitant for him to use a cam walker initially as it may increase his risk of falling.  He agrees. >25 minutes spent in face to face time with patient, >50% spent in counselling or coordination of care

## 2018-07-21 NOTE — Assessment & Plan Note (Signed)
Continue statin.  Labs discussed with patient.  He is going to work on diet and eventually returned to working on exercise.

## 2018-07-21 NOTE — Assessment & Plan Note (Signed)
Declined hearing aids.  Wife designated if patient were incapacitated.   Vaccines up to date except for shingrix, out of stock.

## 2018-07-21 NOTE — Assessment & Plan Note (Signed)
Wife designated if patient were incapacitated.  

## 2018-07-22 ENCOUNTER — Other Ambulatory Visit: Payer: Self-pay | Admitting: Family Medicine

## 2018-07-23 ENCOUNTER — Other Ambulatory Visit: Payer: Self-pay | Admitting: Cardiology

## 2018-07-23 DIAGNOSIS — I480 Paroxysmal atrial fibrillation: Secondary | ICD-10-CM

## 2018-08-01 ENCOUNTER — Ambulatory Visit (INDEPENDENT_AMBULATORY_CARE_PROVIDER_SITE_OTHER): Payer: Medicare Other | Admitting: *Deleted

## 2018-08-01 DIAGNOSIS — I442 Atrioventricular block, complete: Secondary | ICD-10-CM

## 2018-08-01 DIAGNOSIS — I5032 Chronic diastolic (congestive) heart failure: Secondary | ICD-10-CM

## 2018-08-02 LAB — CUP PACEART REMOTE DEVICE CHECK
Battery Remaining Longevity: 94 mo
Battery Voltage: 3.01 V
Brady Statistic AP VP Percent: 8.7 %
Brady Statistic AP VS Percent: 1 %
Brady Statistic AS VP Percent: 91 %
Brady Statistic AS VS Percent: 1 %
Brady Statistic RA Percent Paced: 8.6 %
Brady Statistic RV Percent Paced: 99 %
Date Time Interrogation Session: 20200309070014
Implantable Lead Implant Date: 20160321
Implantable Lead Location: 753859
Implantable Lead Location: 753860
Lead Channel Impedance Value: 450 Ohm
Lead Channel Impedance Value: 550 Ohm
Lead Channel Pacing Threshold Amplitude: 0.75 V
Lead Channel Pacing Threshold Amplitude: 1.75 V
Lead Channel Pacing Threshold Pulse Width: 0.5 ms
Lead Channel Pacing Threshold Pulse Width: 0.8 ms
Lead Channel Sensing Intrinsic Amplitude: 10.1 mV
Lead Channel Sensing Intrinsic Amplitude: 2.6 mV
Lead Channel Setting Pacing Amplitude: 2.5 V
Lead Channel Setting Pacing Amplitude: 3 V
Lead Channel Setting Pacing Pulse Width: 0.5 ms
Lead Channel Setting Sensing Sensitivity: 8 mV
MDC IDC LEAD IMPLANT DT: 20160321
MDC IDC MSMT BATTERY REMAINING PERCENTAGE: 95.5 %
MDC IDC PG IMPLANT DT: 20160321
Pulse Gen Model: 2240
Pulse Gen Serial Number: 7734710

## 2018-08-08 NOTE — Progress Notes (Signed)
Remote pacemaker transmission.   

## 2018-08-18 ENCOUNTER — Other Ambulatory Visit: Payer: Self-pay | Admitting: Cardiology

## 2018-08-18 DIAGNOSIS — E785 Hyperlipidemia, unspecified: Secondary | ICD-10-CM

## 2018-08-18 DIAGNOSIS — I10 Essential (primary) hypertension: Secondary | ICD-10-CM

## 2018-08-19 ENCOUNTER — Telehealth: Payer: Self-pay | Admitting: Cardiology

## 2018-08-19 NOTE — Telephone Encounter (Signed)
   Primary Cardiologist:  Dr. Meda Coffee  Patient contacted.  History reviewed.  No symptoms to suggest any unstable cardiac conditions.  Based on discussion, with current pandemic situation, we will be postponing this appointment for Dean Murphy with a plan for f/u in 6-12 wks per Dr Meda Coffee if feasible/necessary.  If symptoms change, he has been instructed to contact our office.   Routing to C19 CANCEL pool for tracking (P CV DIV CV19 CANCEL - reason for visit "other.") and assigning priority (1 = 4-6 wks, 2 = 6-12 wks, 3 = >12 wks).   Nuala Alpha, LPN  1/44/3154 0:08 PM    Pt stated he was good on refills at this time.  Pt was more than gracious for all the assistance provided.      [name]

## 2018-08-19 NOTE — Telephone Encounter (Signed)
Please reschedule this patient's visit for 6 to 12 weeks unless he has new or worsening cardiac symptoms.  Ena Dawley, MD

## 2018-08-25 ENCOUNTER — Ambulatory Visit: Payer: Medicare Other | Admitting: Cardiology

## 2018-09-13 ENCOUNTER — Other Ambulatory Visit: Payer: Self-pay | Admitting: Family Medicine

## 2018-09-20 ENCOUNTER — Telehealth: Payer: Self-pay | Admitting: Cardiology

## 2018-09-20 NOTE — Telephone Encounter (Signed)
Left message for patient to call me back regarding rescheduling his appointment with Dr. Meda Coffee that had to be canceled.  I wanted to schedule him with Robbie Lis for this Thursday at 11am. Please transfer call to [phone].

## 2018-09-21 NOTE — Telephone Encounter (Signed)
error 

## 2018-09-21 NOTE — Telephone Encounter (Signed)
Virtual Visit Pre-Appointment Phone Call  "(Name), I am calling you today to discuss your upcoming appointment. We are currently trying to limit exposure to the virus that causes COVID-19 by seeing patients at home rather than in the office."  1. "What is the BEST phone number to call the day of the visit?" - include this in appointment notes  2. Do you have or have access to (through a family member/friend) a smartphone with video capability that we can use for your visit?" a. If yes - list this number in appt notes as cell (if different from BEST phone #) and list the appointment type as a VIDEO visit in appointment notes b. If no - list the appointment type as a PHONE visit in appointment notes  Confirm consent - "In the setting of the current Covid19 crisis, you are scheduled for a (phone or video) visit with your provider on (date) at (time).  Just as we do with many in-office visits, in order for you to participate in this visit, we must obtain consent.  If you'd like, I can send this to your mychart (if signed up) or email for you to review.  Otherwise, I can obtain your verbal consent now.  All virtual visits are billed to your insurance company just like a normal visit would be.  By agreeing to a virtual visit, we'd like you to understand that the technology does not allow for your provider to perform an examination, and thus may limit your provider's ability to fully assess your condition. If your provider identifies any concerns that need to be evaluated in person, we will make arrangements to do so.  Finally, though the technology is pretty good, we cannot assure that it will always work on either your or our end, and in the setting of a video visit, we may have to convert it to a phone-only visit.  In either situation, we cannot ensure that we have a secure connection.  Are you willing to proceed?"  Patient said "yes".  3. Advise patient to be prepared - "Two hours prior to your  appointment, go ahead and check your blood pressure, pulse, oxygen saturation, and your weight (if you have the equipment to check those) and write them all down. When your visit starts, your provider will ask you for this information. If you have an Apple Watch or Kardia device, please plan to have heart rate information ready on the day of your appointment. Please have a pen and paper handy nearby the day of the visit as well."  4. Give patient instructions for MyChart download to smartphone OR Doximity/Doxy.me as below if video visit (depending on what platform provider is using)  5. Inform patient they will receive a phone call 15 minutes prior to their appointment time (may be from unknown caller ID) so they should be prepared to answer    TELEPHONE CALL NOTE  Dean Murphy has been deemed a candidate for a follow-up tele-health visit to limit community exposure during the Covid-19 pandemic. I spoke with the patient via phone to ensure availability of phone/video source, confirm preferred email & phone number, and discuss instructions and expectations.  I reminded Dean Murphy to be prepared with any vital sign and/or heart rhythm information that could potentially be obtained via home monitoring, at the time of his visit. I reminded Dean Murphy to expect a phone call prior to his visit.  Dean Murphy 09/21/2018 2:21 PM  INSTRUCTIONS FOR DOWNLOADING THE MYCHART APP TO SMARTPHONE  - The patient must first make sure to have activated MyChart and know their login information - If Apple, go to [employer] and type in MyChart in the search bar and download the app. If Android, ask patient to go to [employer] and type in [place] in the search bar and download the app. The app is free but as with any other app downloads, their phone may require them to verify saved payment information or Apple/Android password.  - The patient will need to then log into the app with their MyChart username  and password, and select [facility] as their healthcare provider to link the account. When it is time for your visit, go to the MyChart app, find appointments, and click Begin Video Visit. Be sure to Select Allow for your device to access the Microphone and Camera for your visit. You will then be connected, and your provider will be with you shortly.  **If they have any issues connecting, or need assistance please contact MyChart service desk [phone])**  **If using a computer, in order to ensure the best quality for their visit they will need to use either of the following Internet Browsers: [employer], or Google Chrome**  IF USING DOXIMITY or DOXY.ME - The patient will receive a link just prior to their visit by text.     FULL LENGTH CONSENT FOR TELE-HEALTH VISIT   I hereby voluntarily request, consent and authorize [facility] and its employed or contracted physicians, physician assistants, nurse practitioners or other licensed health care professionals (the Practitioner), to provide me with telemedicine health care services (the Services") as deemed necessary by the treating Practitioner. I acknowledge and consent to receive the Services by the Practitioner via telemedicine. I understand that the telemedicine visit will involve communicating with the Practitioner through live audiovisual communication technology and the disclosure of certain medical information by electronic transmission. I acknowledge that I have been given the opportunity to request an in-person assessment or other available alternative prior to the telemedicine visit and am voluntarily participating in the telemedicine visit.  I understand that I have the right to withhold or withdraw my consent to the use of telemedicine in the course of my care at any time, without affecting my right to future care or treatment, and that the Practitioner or I may terminate the telemedicine visit at any time. I  understand that I have the right to inspect all information obtained and/or recorded in the course of the telemedicine visit and may receive copies of available information for a reasonable fee.  I understand that some of the potential risks of receiving the Services via telemedicine include:   Delay or interruption in medical evaluation due to technological equipment failure or disruption;  Information transmitted may not be sufficient (e.g. poor resolution of images) to allow for appropriate medical decision making by the Practitioner; and/or   In rare instances, security protocols could fail, causing a breach of personal health information.  Furthermore, I acknowledge that it is my responsibility to provide information about my medical history, conditions and care that is complete and accurate to the best of my ability. I acknowledge that Practitioner's advice, recommendations, and/or decision may be based on factors not within their control, such as incomplete or inaccurate data provided by me or distortions of diagnostic images or specimens that may result from electronic transmissions. I understand that the practice of medicine is not an exact science  and that Practitioner makes no warranties or guarantees regarding treatment outcomes. I acknowledge that I will receive a copy of this consent concurrently upon execution via email to the email address I last provided but may also request a printed copy by calling the office of [facility].    I understand that my insurance will be billed for this visit.   I have read or had this consent read to me.  I understand the contents of this consent, which adequately explains the benefits and risks of the Services being provided via telemedicine.   I have been provided ample opportunity to ask questions regarding this consent and the Services and have had my questions answered to my satisfaction.  I give my informed consent for the services to be  provided through the use of telemedicine in my medical care  By participating in this telemedicine visit I agree to the above.

## 2018-09-21 NOTE — Telephone Encounter (Signed)
Spoke with patient who confirmed all demographics. Patient does not have a computer or smart phone. He prefers a Engineer, manufacturing systems call. Will have vitals ready for visit.

## 2018-09-23 ENCOUNTER — Other Ambulatory Visit: Payer: Self-pay | Admitting: Family Medicine

## 2018-10-02 ENCOUNTER — Encounter: Payer: Self-pay | Admitting: Physician Assistant

## 2018-10-02 NOTE — Progress Notes (Signed)
Virtual Visit via Telephone Note   This visit type was conducted due to national recommendations for restrictions regarding the COVID-19 Pandemic (e.g. social distancing) in an effort to limit this patient's exposure and mitigate transmission in our community.  Due to his co-morbid illnesses, this patient is at least at moderate risk for complications without adequate follow up.  This format is felt to be most appropriate for this patient at this time.  The patient did not have access to video technology/had technical difficulties with video requiring transitioning to audio format only (telephone).  All issues noted in this document were discussed and addressed.  No physical exam could be performed with this format.  Please refer to the patient's chart for his  consent to telehealth for [facility].   Date:  10/03/2018   ID:  Dean Murphy, DOB 07-Sep-1926, MRN [id number]  Patient Location: Home Provider Location: Home  PCP:  Tonia Ghent, MD  Cardiologist:  Ena Dawley, MD  Electrophysiologist:  None   Evaluation Performed:  Follow-Up Visit  Chief Complaint:  F/u CAD, PAF  History of Present Illness:    Dean Murphy is a 85 y.o. male with CAD s/p CABG 1989, CKD stage III-IV, HTN with history of orthostasis, DM, anemia, CHB 11/3218 (complicated by acute diastolic CHF) s/p PPM, CVA, carotid disease s/p CEAs (followed by [facility]), hyperlipidemia, PAF, hypotension, L knee osteomyelitis who presents for 6 month cardiac follow-up. Last echo 08/2017 showed EF 55-60%, HK of apical septal myocardium, grade 1 DD, mild MR, severe LAE, mildly increased PASP. Last labs 06/2018 normal CBC, K 4.9, Cr 1.77, A1C 8.7, 03/2018 LDL 86 (PCP).  He is seen virtually today for routine follow-up. He denies any chest pain, SOB, edema, syncope, palpitations or bleeding. He had a mechanical fall in 06/2018 but in general does not usually fall. He is able to work out in the yard and "do what I want to do." The patient  does not have symptoms concerning for COVID-19 infection (fever, chills, cough, or new shortness of breath). He does not see amlodipine in his box anymore.  Past Medical History:  Diagnosis Date   Anemia    Arthritis    CAD (coronary artery disease)    a. CABG 1989, b. Myoview low risk 2012.   Carotid artery occlusion    a. Duplex 10/2014: patent R/L CEA with mild hyperplasia in right surgical bulb - followed by vascular.   CHB (complete heart block) [facility]) March 2016   a. s/p STJ dual chamber pacemaker 07/2014.   Chronic diastolic CHF (congestive heart failure) ([place])    a. Dx 06/5425 - acute diastolic CHF in the setting of CHB.   Chronic kidney disease, stage IV (severe) (HCC)    stage III/IV as of 2015, [name]   Colon polyps    Complication of anesthesia    Constipation    CVA (cerebral infarction)    Diabetes mellitus    type II   Diverticulosis    Dupuytren's disease    finger right hand contracted   Essential hypertension    GERD (gastroesophageal reflux disease)    Hemorrhoids    Hyperlipidemia    Myocardial infarction ([place]) 1976  and Mar. 19, 2016   Orthostasis    Osteoporosis    PAF (paroxysmal atrial fibrillation) ([place]) March 2016   PONV (postoperative nausea and vomiting)    Presence of permanent cardiac pacemaker    Shortness of breath dyspnea    due  to pain   Past Surgical History:  Procedure Laterality Date   CARDIAC CATHETERIZATION  08/11/2014   Procedure: TEMPORARY PACEMAKER;  Surgeon: [name], MD;  Location: [facility] CATH LAB;  Service: Cardiovascular;;   CAROTID ENDARTERECTOMY  12/31/10   Right   CAROTID ENDARTERECTOMY  02/09/11   left   CHOLECYSTECTOMY     CORONARY ARTERY BYPASS GRAFT  1989   EYE SURGERY Bilateral    Cataract with implants   I&D KNEE WITH POLY EXCHANGE Left 07/29/2015   Procedure: IRRIGATION AND DEBRIDEMENT LEFT KNEE WITH POLY EXCHANGE;  Surgeon: Rod Can, MD;  Location: [facility];  Service:  Orthopedics;  Laterality: Left;   IR GENERIC HISTORICAL  03/04/2016   IR US GUIDE VASC ACCESS RIGHT 03/04/2016 Marybelle Killings, MD [facility]   IR GENERIC HISTORICAL  03/04/2016   IR FLUORO GUIDE CV LINE RIGHT 03/04/2016 Marybelle Killings, MD [facility]   JOINT REPLACEMENT     bilat. knees   PERMANENT PACEMAKER INSERTION N/A 08/13/2014   STJ dual chamber pacemaker implanted by [name] for CHB   TONSILLECTOMY     TOTAL KNEE ARTHROPLASTY     bilateral     Current Meds  Medication Sig   amLODipine (NORVASC) 2.5 MG tablet Take 1 tablet (2.5 mg total) by mouth daily. Pt believes he's not taking   amoxicillin (AMOXIL) 500 MG capsule Take 1 capsule (500 mg total) by mouth 2 (two) times daily.   clotrimazole-betamethasone (LOTRISONE) cream Apply 1 application topically 2 (two) times daily.   diltiazem (CARDIZEM CD) 360 MG 24 hr capsule TAKE ONE CAPSULE BY MOUTH DAILY   ELIQUIS 2.5 MG TABS tablet TAKE 1 TABLET BY MOUTH TWICE A DAY   ferrous sulfate 325 (65 FE) MG tablet Take 1 pill on MWF   glipiZIDE (GLUCOTROL) 5 MG tablet TAKE 2 TABLETS BY MOUTH EVERY MORNING BEFORE BREAKFAST AND 1 TABLET BEFORE SUPPER   Insulin Glargine (BASAGLAR KWIKPEN) 100 UNIT/ML SOPN INJECT 20 UNITS INTO THE SKIN AT BEDTIME   Insulin Pen Needle (LEADER UNIFINE PENTIPS) 31G X 5 MM MISC USE DAILY WITH INSULIN PEN   JANUVIA 50 MG tablet TAKE 1 TABLET BY MOUTH EVERY DAY   losartan (COZAAR) 50 MG tablet Take 50 mg by mouth daily.   lubiprostone (AMITIZA) 8 MCG capsule TAKE ONE CAPSULE BY MOUTH EACH MORNING   omeprazole (PRILOSEC) 40 MG capsule TAKE 1 CAPSULE BY MOUTH EVERY DAY   Ophthalmic Irrigation Solution (EYE WASH) 99.05 % SOLN Place 1 drop into both eyes 2 (two) times daily.   oxyCODONE-acetaminophen (PERCOCET) 5-325 MG tablet Take 0.5 tablets by mouth every 6 (six) hours as needed.   rosuvastatin (CRESTOR) 20 MG tablet TAKE 1 TABLET BY MOUTH DAILY     Allergies:   Zoledronic acid   Social  History   Tobacco Use   Smoking status: Former Smoker    Packs/day: 1.00    Years: 25.00    Pack years: 25.00    Types: Cigarettes    Last attempt to quit: 01/21/1973    Years since quitting: 45.7   Smokeless tobacco: Never Used   Tobacco comment: began smoking in high school, quit age 19  Substance Use Topics   Alcohol use: Yes    Alcohol/week: 0.0 standard drinks    Comment: wine occ   Drug use: No     Family Hx: The patient's family history includes Cancer in an other family member; Diabetes in his mother, son, son, and another family member;  Heart attack in his brother, father, and mother; Heart disease in his brother, father, and mother; Hypertension in his brother, father, son, and son; Varicose Veins in his mother. There is no history of Colon cancer.  ROS:   Please see the history of present illness.     All other systems reviewed and are negative.   Prior CV studies:   Most recent pertinent cardiac studies are outlined above.   Labs/Other Tests and Data Reviewed:    EKG:  An ECG dated 10/2017 was personally reviewed today and demonstrated:  atrial sensed, V paced rhythm    Recent Labs: 04/11/2018: ALT 20 07/07/2018: BUN 21; Creatinine, Ser 1.77; Hemoglobin 15.1; Platelets 178.0; Potassium 4.9; Sodium 145   Recent Lipid Panel Lab Results  Component Value Date/Time   CHOL 166 04/11/2018 08:12 AM   TRIG 175.0 (H) 04/11/2018 08:12 AM   TRIG 62 04/28/2006 09:17 AM   HDL 45.00 04/11/2018 08:12 AM   CHOLHDL 4 04/11/2018 08:12 AM   LDLCALC 86 04/11/2018 08:12 AM   LDLDIRECT 135.1 03/12/2014 08:12 AM    Wt Readings from Last 3 Encounters:  10/03/18 200 lb (90.7 kg)  07/18/18 207 lb 7 oz (94.1 kg)  07/11/18 200 lb (90.7 kg)     Objective:    Vital Signs:  BP 119/65    Pulse 81    Ht 5\' 9"  (1.753 m)    Wt 200 lb (90.7 kg)    SpO2 94%    BMI 29.53 kg/m    VITAL SIGNS:  reviewed  General - male in no acute distress Pulm - No labored breathing, no  coughing during visit, no audible wheezing, speaking in full sentences Neuro - A+Ox3, no slurred speech, answers questions appropriately Psych - Pleasant affect    ASSESSMENT & PLAN:    1. CAD - asymptomatic. Not on ASA given concomitant Eliquis. No anginal symptoms noted. 2. Essential HTN - BP controlled. He was on amlodipine + dilitazem previously which had been a longstanding combination. This was a bit odd but was continued in light of his medical stability with both control of atrial fib and angina. He does not believe he is on amlodipine any longer but will check in his med box. Will remove from his list. RMA will call to clarify. The patient will call us back if he sees that he is in fact taking it. Since he is doing well, I told him whatever he is presently on (either on or off amlodipine), he should hold course. 3. CHB s/p PPM - followed by EP. Has remote monitoring 10/2018. 4. Paroxysmal atrial fibrillation - no recent symptoms. Continues on Eliquis. Normal CBC 06/2018. Anticipate follow-up labs when he returns in 6 months if not done by PCP.   COVID-19 Education: The signs and symptoms of COVID-19 were discussed with the patient and how to seek care for testing (follow up with PCP or arrange E-visit).  The importance of social distancing was discussed today.  Time:   Today, I have spent 18 minutes with the patient with telehealth technology discussing the above problems.     Medication Adjustments/Labs and Tests Ordered: Current medicines are reviewed at length with the patient today.  Concerns regarding medicines are outlined above.    Disposition:  Follow up in 6 months with Dr. Meda Coffee.  Signed, Charlie Pitter, PA-C  10/03/2018 12:00 PM    [place]

## 2018-10-03 ENCOUNTER — Telehealth (INDEPENDENT_AMBULATORY_CARE_PROVIDER_SITE_OTHER): Payer: Medicare Other | Admitting: Physician Assistant

## 2018-10-03 ENCOUNTER — Other Ambulatory Visit: Payer: Self-pay

## 2018-10-03 ENCOUNTER — Encounter: Payer: Self-pay | Admitting: Physician Assistant

## 2018-10-03 VITALS — BP 119/65 | HR 81 | Ht 69.0 in | Wt 200.0 lb

## 2018-10-03 DIAGNOSIS — I2510 Atherosclerotic heart disease of native coronary artery without angina pectoris: Secondary | ICD-10-CM

## 2018-10-03 DIAGNOSIS — I10 Essential (primary) hypertension: Secondary | ICD-10-CM

## 2018-10-03 DIAGNOSIS — I480 Paroxysmal atrial fibrillation: Secondary | ICD-10-CM

## 2018-10-03 DIAGNOSIS — Z950 Presence of cardiac pacemaker: Secondary | ICD-10-CM

## 2018-10-03 NOTE — Patient Instructions (Signed)
Medication Instructions:  Your physician recommends that you continue on your current medications as directed. Please refer to the Current Medication list given to you today.  If you need a refill on your cardiac medications before your next appointment, please call your pharmacy.   Lab work: None ordered  If you have labs (blood work) drawn today and your tests are completely normal, you will receive your results only by: . MyChart Message (if you have MyChart) OR . A paper copy in the mail If you have any lab test that is abnormal or we need to change your treatment, we will call you to review the results.  Testing/Procedures: None ordered  Follow-Up: At [facility], you and your health needs are our priority.  As part of our continuing mission to provide you with exceptional heart care, we have created designated Provider Care Teams.  These Care Teams include your primary Cardiologist (physician) and Advanced Practice Providers (APPs -  Physician Assistants and Nurse Practitioners) who all work together to provide you with the care you need, when you need it. You will need a follow up appointment in 6 months.  Please call our office 2 months in advance to schedule this appointment.  You may see Katarina Nelson, MD or one of the following Advanced Practice Providers on your designated Care Team:   Brittainy Simmons, PA-C Dayna Dunn, PA-C . Michele Lenze, PA-C  Any Other Special Instructions Will Be Listed Below (If Applicable).    

## 2018-10-14 ENCOUNTER — Other Ambulatory Visit: Payer: Self-pay | Admitting: Family Medicine

## 2018-10-14 ENCOUNTER — Other Ambulatory Visit (INDEPENDENT_AMBULATORY_CARE_PROVIDER_SITE_OTHER): Payer: Medicare Other

## 2018-10-14 DIAGNOSIS — D509 Iron deficiency anemia, unspecified: Secondary | ICD-10-CM

## 2018-10-14 DIAGNOSIS — E1122 Type 2 diabetes mellitus with diabetic chronic kidney disease: Secondary | ICD-10-CM

## 2018-10-14 LAB — CBC WITH DIFFERENTIAL/PLATELET
Basophils Absolute: 0.1 10*3/uL (ref 0.0–0.1)
Basophils Relative: 0.6 % (ref 0.0–3.0)
Eosinophils Absolute: 0.1 10*3/uL (ref 0.0–0.7)
Eosinophils Relative: 1.6 % (ref 0.0–5.0)
HCT: 42.1 % (ref 39.0–52.0)
Hemoglobin: 14.7 g/dL (ref 13.0–17.0)
Lymphocytes Relative: 26.8 % (ref 12.0–46.0)
Lymphs Abs: 2.1 10*3/uL (ref 0.7–4.0)
MCHC: 34.8 g/dL (ref 30.0–36.0)
MCV: 90.4 fl (ref 78.0–100.0)
Monocytes Absolute: 0.5 10*3/uL (ref 0.1–1.0)
Monocytes Relative: 6.4 % (ref 3.0–12.0)
Neutro Abs: 5 10*3/uL (ref 1.4–7.7)
Neutrophils Relative %: 64.6 % (ref 43.0–77.0)
Platelets: 128 10*3/uL — ABNORMAL LOW (ref 150.0–400.0)
RBC: 4.66 Mil/uL (ref 4.22–5.81)
RDW: 13.8 % (ref 11.5–15.5)
WBC: 7.8 10*3/uL (ref 4.0–10.5)

## 2018-10-14 LAB — IBC PANEL
Iron: 84 ug/dL (ref 42–165)
Saturation Ratios: 27.6 % (ref 20.0–50.0)
Transferrin: 217 mg/dL (ref 212.0–360.0)

## 2018-10-14 LAB — BASIC METABOLIC PANEL
BUN: 23 mg/dL (ref 6–23)
CO2: 28 mEq/L (ref 19–32)
Calcium: 9.2 mg/dL (ref 8.4–10.5)
Chloride: 108 mEq/L (ref 96–112)
Creatinine, Ser: 2.02 mg/dL — ABNORMAL HIGH (ref 0.40–1.50)
GFR: 31.22 mL/min — ABNORMAL LOW (ref >60.00)
Glucose, Bld: 259 mg/dL — ABNORMAL HIGH (ref 70–99)
Potassium: 5.4 mEq/L — ABNORMAL HIGH (ref 3.5–5.1)
Sodium: 142 mEq/L (ref 135–145)

## 2018-10-14 LAB — HEMOGLOBIN A1C: Hgb A1c MFr Bld: 8.4 % — ABNORMAL HIGH (ref 4.6–6.5)

## 2018-10-18 ENCOUNTER — Ambulatory Visit (INDEPENDENT_AMBULATORY_CARE_PROVIDER_SITE_OTHER): Payer: Medicare Other | Admitting: Family Medicine

## 2018-10-18 VITALS — BP 145/78 | HR 72 | Ht 69.0 in | Wt 201.0 lb

## 2018-10-18 DIAGNOSIS — N183 Chronic kidney disease, stage 3 unspecified: Secondary | ICD-10-CM

## 2018-10-18 DIAGNOSIS — D508 Other iron deficiency anemias: Secondary | ICD-10-CM | POA: Diagnosis not present

## 2018-10-18 DIAGNOSIS — E1122 Type 2 diabetes mellitus with diabetic chronic kidney disease: Secondary | ICD-10-CM

## 2018-10-18 MED ORDER — BASAGLAR KWIKPEN 100 UNIT/ML ~~LOC~~ SOPN: PEN_INJECTOR | SUBCUTANEOUS | Status: DC

## 2018-10-18 NOTE — Progress Notes (Signed)
Interactive audio and video telecommunications were attempted between this provider and patient, however failed, due to patient having technical difficulties OR patient did not have access to video capability.  We continued and completed visit with audio only.   Virtual Visit via Telephone Note  I connected with patient on 10/18/18 at 8:35 AM by telephone and verified that I am speaking with the correct person using two identifiers.  Location of patient: home.   Location of MD: [facility] Name of referring provider (if blank then none associated): Names per persons and role in encounter:  MD: Earlyne Iba, Patient: name listed above.    I discussed the limitations, risks, security and privacy concerns of performing an evaluation and management service by telephone and the availability of in person appointments. I also discussed with the patient that there may be a patient responsible charge related to this service. The patient expressed understanding and agreed to proceed.  CC: follow up   History of Present Illness:   Diabetes:  Using medications without difficulties: yes Hypoglycemic episodes: no Hyperglycemic episodes:no Feet problems: some pain from prev fall but no tingling.  Cautions d/w pt.   Blood Sugars averaging: usually ~130s-140s, occ higher.   eye exam within last year:yes A1c slightly better at 8.4.  D/w pt.    CKD  With Creatinine and K elevation noted.   Discussed with patient about recheck in the near future.  Mildly low platelets at baseline. No bleeding.  H/o IDA on iron MWF.   Iron and hemoglobin okay. It likely makes sense to continue iron as is.    PMH and SH reviewed  ROS: Per HPI unless specifically indicated in ROS section   Meds and allergies reviewed.   Pandemic consideration d/w pt.     Observations/Objective: nad Speech wnl.    Assessment and Plan: DM. A1c slightly better at 8.4.  D/w pt.   goal is to avoid hypoglycemia. Plan on recheck  in about 3 months re: DM2  CKD-discussed with patient, plan to recheck labs 10/19/2018 at 2:30.     IDA.  No change in meds.  Mildly low platelets at baseline. No bleeding.  H/o IDA on iron MWF.   Iron and hemoglobin okay. It likely makes sense to continue iron as is.    Follow Up Instructions:see above.  Plan on recheck in about 3 months re: DM2 but recheck BMET in the meantime.    I discussed the assessment and treatment plan with the patient. The patient was provided an opportunity to ask questions and all were answered. The patient agreed with the plan and demonstrated an understanding of the instructions.   The patient was advised to call back or seek an in-person evaluation if the symptoms worsen or if the condition fails to improve as anticipated.  I provided 20 minutes of non-face-to-face time during this encounter.  Elsie Stain, MD

## 2018-10-19 ENCOUNTER — Other Ambulatory Visit: Payer: Self-pay

## 2018-10-19 ENCOUNTER — Encounter: Payer: Self-pay | Admitting: Family Medicine

## 2018-10-19 ENCOUNTER — Other Ambulatory Visit (INDEPENDENT_AMBULATORY_CARE_PROVIDER_SITE_OTHER): Payer: Medicare Other

## 2018-10-19 DIAGNOSIS — N183 Chronic kidney disease, stage 3 unspecified: Secondary | ICD-10-CM

## 2018-10-19 NOTE — Assessment & Plan Note (Signed)
A1c slightly better at 8.4.  D/w pt.   goal is to avoid hypoglycemia. Plan on recheck in about 3 months re: DM2

## 2018-10-19 NOTE — Assessment & Plan Note (Signed)
discussed with patient, plan to recheck labs 10/19/2018 at 2:30.

## 2018-10-19 NOTE — Assessment & Plan Note (Signed)
IDA.  No change in meds.  Mildly low platelets at baseline. No bleeding.  H/o IDA on iron MWF.   Iron and hemoglobin okay. It likely makes sense to continue iron as is.  He agrees.

## 2018-10-20 LAB — BASIC METABOLIC PANEL
BUN: 23 mg/dL (ref 6–23)
CO2: 29 mEq/L (ref 19–32)
Calcium: 9.3 mg/dL (ref 8.4–10.5)
Chloride: 110 mEq/L (ref 96–112)
Creatinine, Ser: 1.75 mg/dL — ABNORMAL HIGH (ref 0.40–1.50)
GFR: 36.84 mL/min — ABNORMAL LOW (ref >60.00)
Glucose, Bld: 180 mg/dL — ABNORMAL HIGH (ref 70–99)
Potassium: 5.1 mEq/L (ref 3.5–5.1)
Sodium: 143 mEq/L (ref 135–145)

## 2018-10-31 ENCOUNTER — Ambulatory Visit (INDEPENDENT_AMBULATORY_CARE_PROVIDER_SITE_OTHER): Payer: Medicare Other | Admitting: *Deleted

## 2018-10-31 DIAGNOSIS — I442 Atrioventricular block, complete: Secondary | ICD-10-CM | POA: Diagnosis not present

## 2018-10-31 DIAGNOSIS — I480 Paroxysmal atrial fibrillation: Secondary | ICD-10-CM

## 2018-10-31 LAB — CUP PACEART REMOTE DEVICE CHECK
Battery Remaining Longevity: 91 mo
Battery Remaining Percentage: 95.5 %
Battery Voltage: 2.99 V
Brady Statistic AP VP Percent: 8.2 %
Brady Statistic AP VS Percent: 1 %
Brady Statistic AS VP Percent: 92 %
Brady Statistic AS VS Percent: 1 %
Brady Statistic RA Percent Paced: 8 %
Brady Statistic RV Percent Paced: 99 %
Date Time Interrogation Session: 20200608060013
Implantable Lead Implant Date: 20160321
Implantable Lead Implant Date: 20160321
Implantable Lead Location: 753859
Implantable Lead Location: 753860
Implantable Pulse Generator Implant Date: 20160321
Lead Channel Impedance Value: 440 Ohm
Lead Channel Impedance Value: 450 Ohm
Lead Channel Pacing Threshold Amplitude: 0.75 V
Lead Channel Pacing Threshold Amplitude: 1.75 V
Lead Channel Pacing Threshold Pulse Width: 0.5 ms
Lead Channel Pacing Threshold Pulse Width: 0.8 ms
Lead Channel Sensing Intrinsic Amplitude: 10.1 mV
Lead Channel Sensing Intrinsic Amplitude: 2.3 mV
Lead Channel Setting Pacing Amplitude: 2.5 V
Lead Channel Setting Pacing Amplitude: 3 V
Lead Channel Setting Pacing Pulse Width: 0.5 ms
Lead Channel Setting Sensing Sensitivity: 8 mV
Pulse Gen Model: 2240
Pulse Gen Serial Number: 7734710

## 2018-11-07 NOTE — Progress Notes (Signed)
Remote pacemaker transmission.   

## 2018-11-13 ENCOUNTER — Other Ambulatory Visit: Payer: Self-pay | Admitting: Family Medicine

## 2018-12-07 IMAGING — DX DG LUMBAR SPINE COMPLETE 4+V
5 series · 5 of 5 positions shown · non-contrast
Comparison: 04/25/2010

CLINICAL DATA: Low back pain for 2 days, no known injury, initial
encounter

EXAM:
LUMBAR SPINE - COMPLETE 4+ VIEW

[l-spine ap]
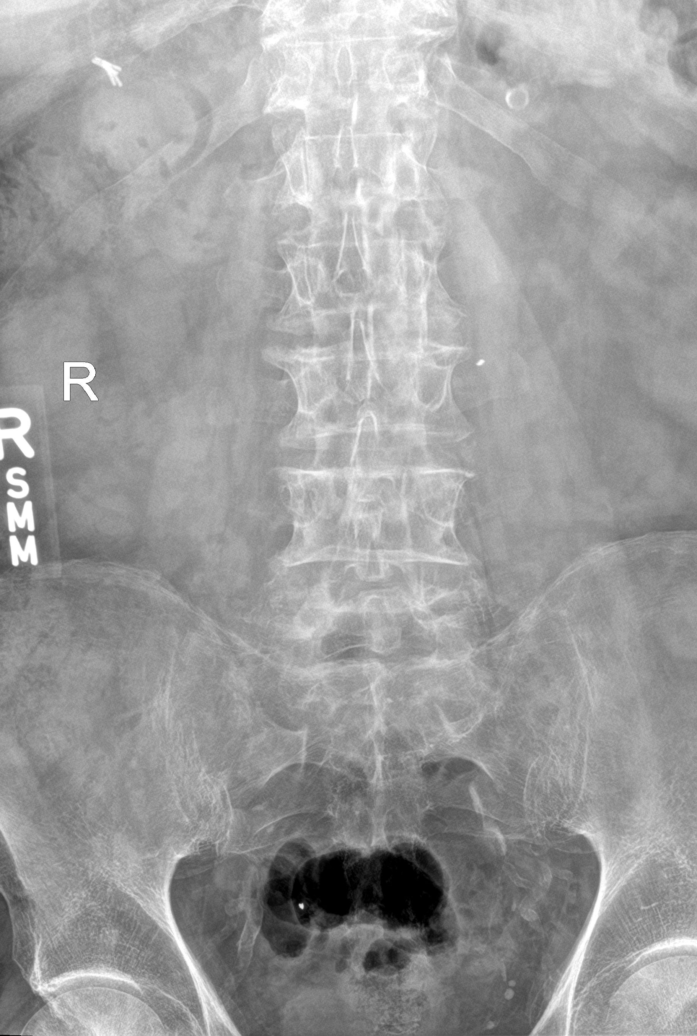

[l-spine obl (1 of 2)]
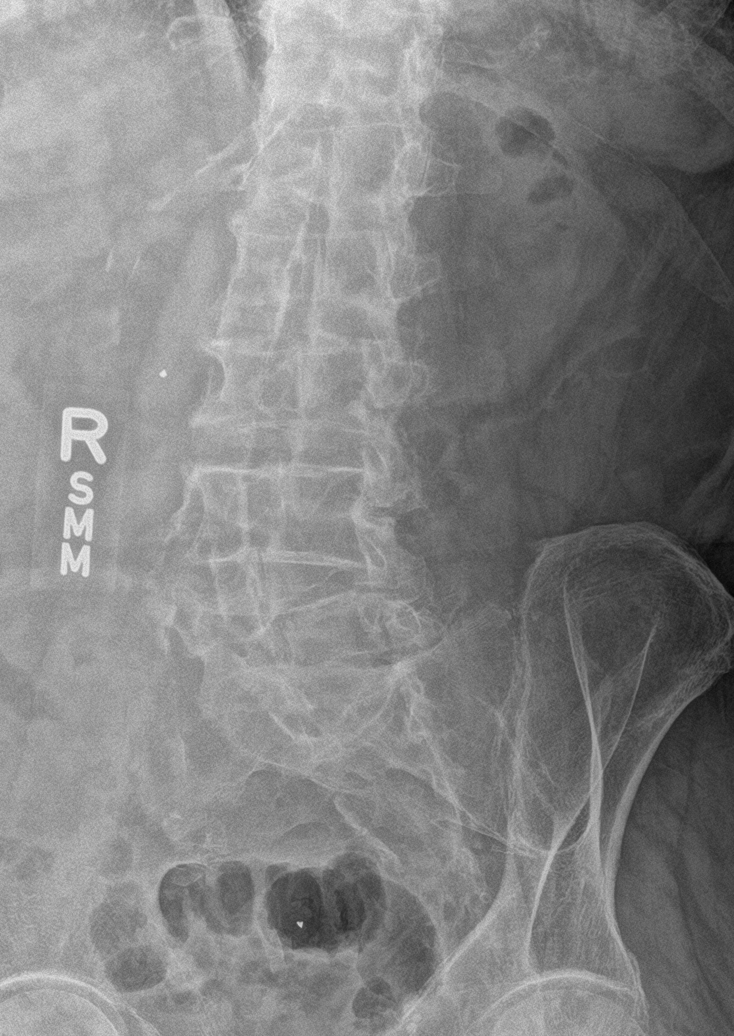

[l-spine obl (2 of 2)]
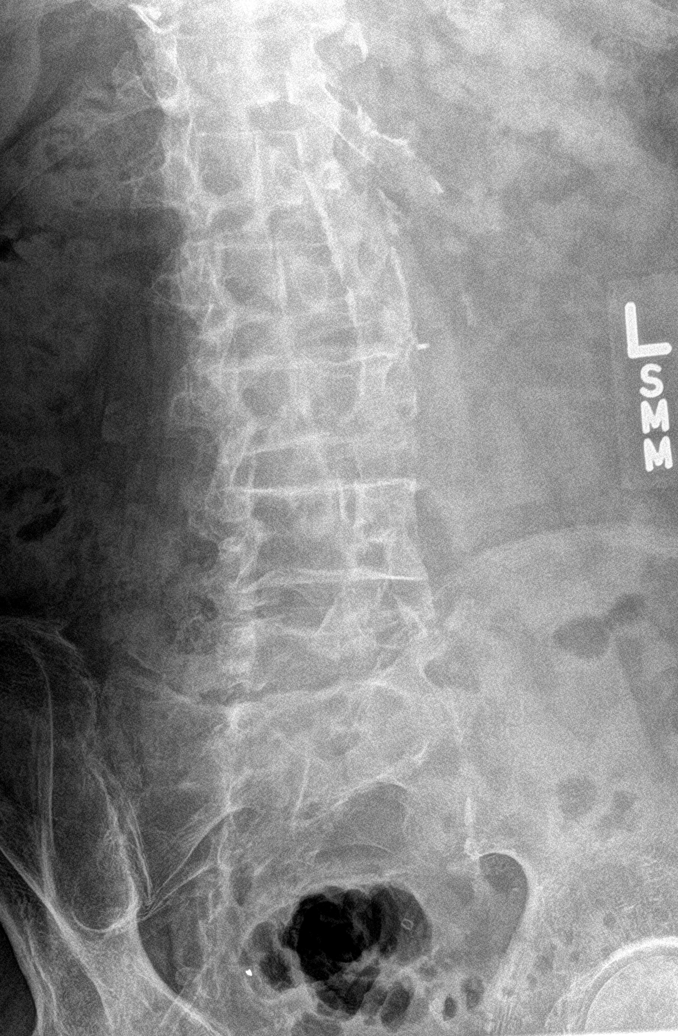

[l-spine lat]
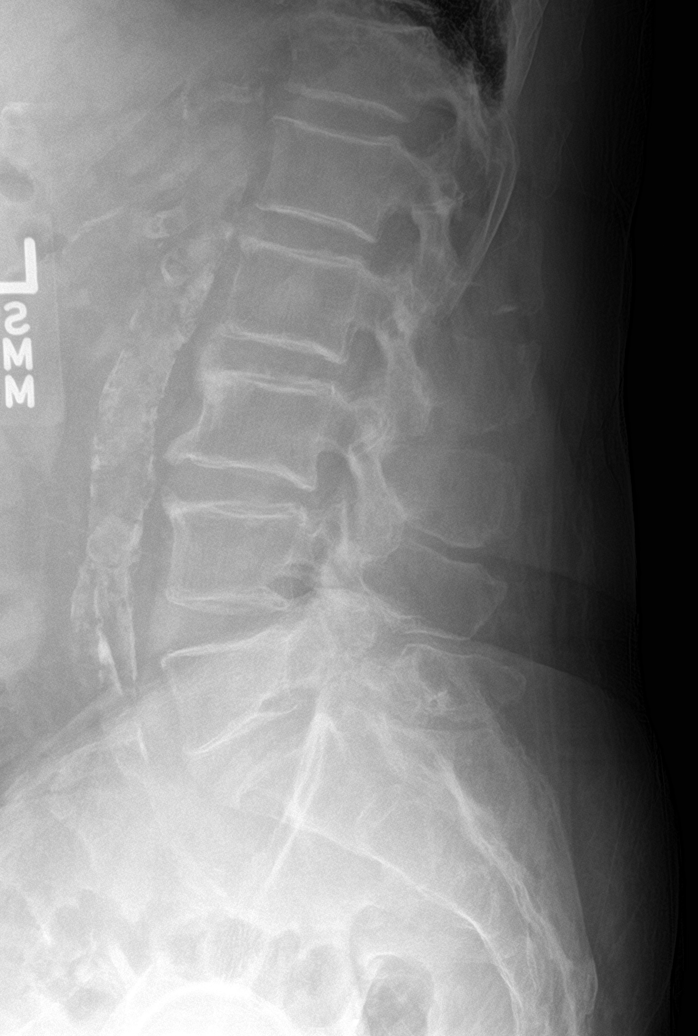

[l-spine spot]
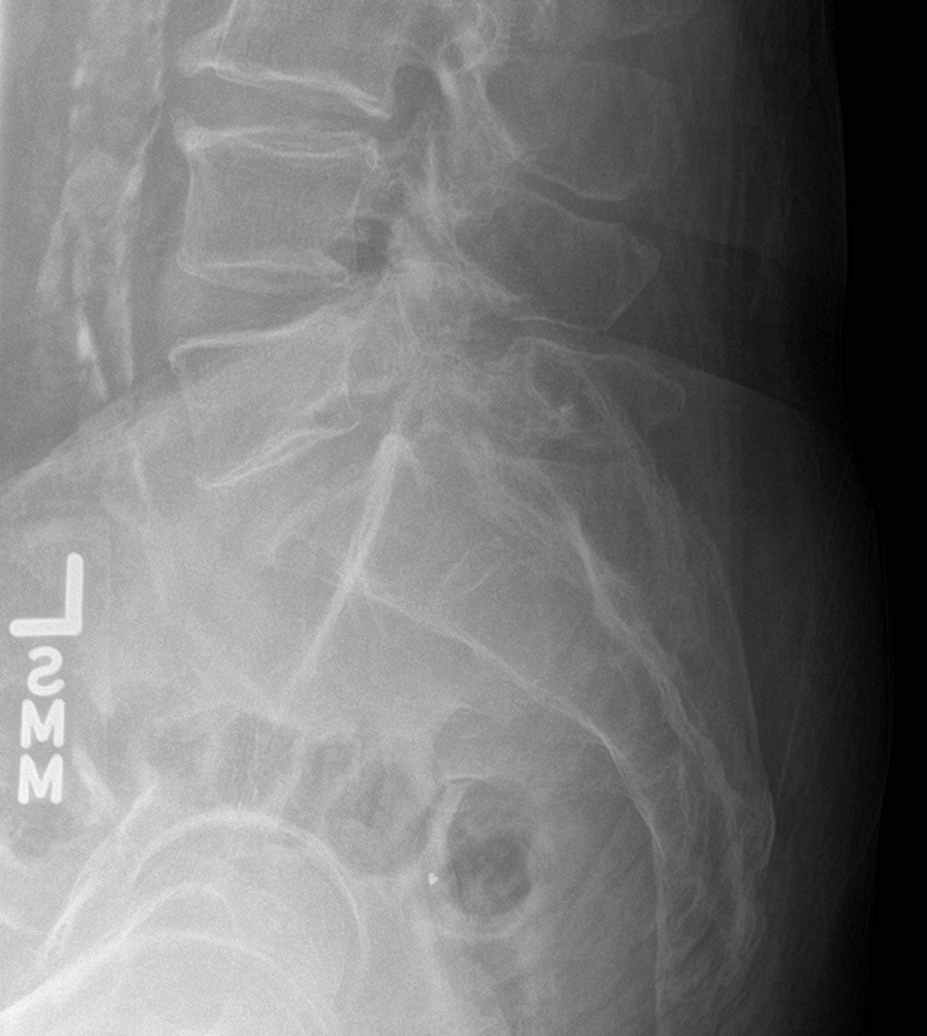

[5 of 5 positions shown; findings below may reference images not displayed]

FINDINGS: Five lumbar type vertebral bodies are well visualized. Vertebral
body height is well maintained. Bilateral L5 pars defects are noted
with mild anterolisthesis. The overall appearance is similar to that
seen on the prior exam. No compression deformity is noted. Mild
osteophytic changes are seen. Diffuse aortic calcifications are
noted.
IMPRESSION: Mild anterolisthesis of L5 on S1 secondary to bilateral pars defects
at L5. No acute abnormality is noted.

## 2019-01-12 ENCOUNTER — Other Ambulatory Visit: Payer: Self-pay | Admitting: Family Medicine

## 2019-01-20 ENCOUNTER — Ambulatory Visit (INDEPENDENT_AMBULATORY_CARE_PROVIDER_SITE_OTHER)
Admission: RE | Admit: 2019-01-20 | Discharge: 2019-01-20 | Disposition: A | Discharge status: Home / Self Care | Payer: Medicare Other | Source: Ambulatory Visit | Attending: Family Medicine | Admitting: Family Medicine

## 2019-01-20 ENCOUNTER — Encounter: Payer: Self-pay | Admitting: Family Medicine

## 2019-01-20 ENCOUNTER — Other Ambulatory Visit: Payer: Self-pay

## 2019-01-20 ENCOUNTER — Ambulatory Visit (INDEPENDENT_AMBULATORY_CARE_PROVIDER_SITE_OTHER): Payer: Medicare Other | Admitting: Family Medicine

## 2019-01-20 VITALS — BP 146/62 | HR 85 | Temp 98.0°F | Ht 69.0 in | Wt 201.5 lb

## 2019-01-20 DIAGNOSIS — M545 Low back pain, unspecified: Secondary | ICD-10-CM

## 2019-01-20 DIAGNOSIS — I10 Essential (primary) hypertension: Secondary | ICD-10-CM

## 2019-01-20 DIAGNOSIS — E1122 Type 2 diabetes mellitus with diabetic chronic kidney disease: Secondary | ICD-10-CM

## 2019-01-20 DIAGNOSIS — Z23 Encounter for immunization: Secondary | ICD-10-CM | POA: Diagnosis not present

## 2019-01-20 LAB — POCT GLYCOSYLATED HEMOGLOBIN (HGB A1C): Hemoglobin A1C: 8.7 % — AB (ref 4.0–5.6)

## 2019-01-20 NOTE — Patient Instructions (Addendum)
Please check your meds at home and let me know if you are still taking torsemide and/or clonidine.  If your med list from today doesn't match, then let me know.    Flu shot today.  Go to the lab on the way out.  We'll contact you with your xray report.  If your AM sugar is consistently >170, then add a unit per day as needed.  If your AM sugar is consistently <120, then cut back a unit per day.  If 120-170, no change.    Plan on recheck in 3 months, labs at the visit.  Take care.  Glad to see you.  Update me as needed.

## 2019-01-20 NOTE — Progress Notes (Signed)
Diabetes:  Using medications without difficulties: yes Hypoglycemic episodes:no Hyperglycemic episodes:no Feet problems: no foot trouble but having more hip and back pain with standing, better immediately with sitting.   Blood Sugars averaging: higher after inc meat intake.  160s in the AM, occ up to 200.   eye exam within last year: yes A1c 8.4--->8.7 today.    Back pain noted after prolonged standing.  Better as soon as he sits down.  No trauma.  D/w pt about likely spinal stenosis based on his sx.  He is putting up with it.  He can tolerate as it.  He didn't want intervention, ie surgery.  No radicular pain.    Hypertension.  He may be off clonidine.  See avs.  Prev renal notes d/w pt.  BP reasonable today.   Meds, vitals, and allergies reviewed.   ROS: Per HPI unless specifically indicated in ROS section   GEN: nad, alert and oriented HEENT: ncat NECK: supple w/o LA CV: rrr. PULM: ctab, no inc wob ABD: soft, +bs EXT: no edema SKIN: well perfused.  Lower midline back ttp but no pain leaning forward.

## 2019-01-22 NOTE — Assessment & Plan Note (Signed)
His blood pressure is reasonable.  Med list discussed with patient.  He will check his med list at home to see if he is still taking torsemide or clonidine.  He will update me if his med list does not match up with the list that I gave him.  Discussed.  He agrees.

## 2019-01-22 NOTE — Assessment & Plan Note (Signed)
Goal A1c would be in the eights.  I do not want to induce hypoglycemia.  Discussed with patient.  If AM sugar is consistently >170, then add a unit per day as needed.  If AM sugar is consistently <120, then cut back a unit per day.  If 120-170, no change in dose.  Plan on recheck in 3 months, labs at the visit.   he agrees.

## 2019-01-22 NOTE — Assessment & Plan Note (Addendum)
It sounds like he has spinal stenosis.  He absolutely does not want to have surgery if at all possible.  He can put up with the pain as is.  We talked about the mechanics of spinal stenosis.  Reasonable to check plain films today to exclude any other obvious pathology.  See notes on imaging.  >25 minutes spent in face to face time with patient, >50% spent in counselling or coordination of care.

## 2019-01-31 ENCOUNTER — Ambulatory Visit (INDEPENDENT_AMBULATORY_CARE_PROVIDER_SITE_OTHER): Payer: Medicare Other | Admitting: *Deleted

## 2019-01-31 DIAGNOSIS — I442 Atrioventricular block, complete: Secondary | ICD-10-CM

## 2019-01-31 LAB — CUP PACEART REMOTE DEVICE CHECK
Battery Remaining Longevity: 94 mo
Battery Remaining Percentage: 95.5 %
Battery Voltage: 2.99 V
Brady Statistic AP VP Percent: 8.1 %
Brady Statistic AP VS Percent: 1 %
Brady Statistic AS VP Percent: 92 %
Brady Statistic AS VS Percent: 1 %
Brady Statistic RA Percent Paced: 8 %
Brady Statistic RV Percent Paced: 99 %
Date Time Interrogation Session: 20200907060012
Implantable Lead Implant Date: 20160321
Implantable Lead Implant Date: 20160321
Implantable Lead Location: 753859
Implantable Lead Location: 753860
Implantable Pulse Generator Implant Date: 20160321
Lead Channel Impedance Value: 440 Ohm
Lead Channel Impedance Value: 560 Ohm
Lead Channel Pacing Threshold Amplitude: 0.75 V
Lead Channel Pacing Threshold Amplitude: 1.75 V
Lead Channel Pacing Threshold Pulse Width: 0.5 ms
Lead Channel Pacing Threshold Pulse Width: 0.8 ms
Lead Channel Sensing Intrinsic Amplitude: 10.1 mV
Lead Channel Sensing Intrinsic Amplitude: 2.4 mV
Lead Channel Setting Pacing Amplitude: 2.5 V
Lead Channel Setting Pacing Amplitude: 3 V
Lead Channel Setting Pacing Pulse Width: 0.5 ms
Lead Channel Setting Sensing Sensitivity: 8 mV
Pulse Gen Model: 2240
Pulse Gen Serial Number: 7734710

## 2019-02-08 ENCOUNTER — Other Ambulatory Visit: Payer: Self-pay | Admitting: Cardiology

## 2019-02-08 DIAGNOSIS — E785 Hyperlipidemia, unspecified: Secondary | ICD-10-CM

## 2019-02-08 DIAGNOSIS — I10 Essential (primary) hypertension: Secondary | ICD-10-CM

## 2019-02-15 ENCOUNTER — Encounter: Payer: Self-pay | Admitting: Cardiology

## 2019-02-15 NOTE — Progress Notes (Signed)
Remote pacemaker transmission.   

## 2019-02-17 ENCOUNTER — Other Ambulatory Visit: Payer: Self-pay | Admitting: Cardiology

## 2019-03-07 ENCOUNTER — Other Ambulatory Visit: Payer: Self-pay | Admitting: Family Medicine

## 2019-04-11 ENCOUNTER — Other Ambulatory Visit: Payer: Self-pay | Admitting: Cardiology

## 2019-04-11 DIAGNOSIS — I480 Paroxysmal atrial fibrillation: Secondary | ICD-10-CM

## 2019-04-11 NOTE — Telephone Encounter (Signed)
Eliquis 2.5mg  refill request received, pt is 85yrs old, weight-91.4kg, Crea-1.75 on 10/19/2018, Diagnosis-Afib, CVA, and last seen by Melina Copa on 10/03/2018 . Dose is appropriate based on dosing criteria. Will send in refill to requested pharmacy.

## 2019-04-12 ENCOUNTER — Ambulatory Visit: Payer: Medicare Other | Admitting: Cardiology

## 2019-04-25 ENCOUNTER — Ambulatory Visit: Payer: Medicare Other | Admitting: Family Medicine

## 2019-05-02 ENCOUNTER — Ambulatory Visit (INDEPENDENT_AMBULATORY_CARE_PROVIDER_SITE_OTHER): Payer: Medicare Other | Admitting: *Deleted

## 2019-05-02 DIAGNOSIS — I442 Atrioventricular block, complete: Secondary | ICD-10-CM

## 2019-05-03 ENCOUNTER — Telehealth: Payer: Self-pay | Admitting: Internal Medicine

## 2019-05-03 NOTE — Telephone Encounter (Signed)
Called patient back. Monitor is still updating. Advised he does not need to sit near it at this time. Will call back later today to attempt manual transmission.

## 2019-05-03 NOTE — Telephone Encounter (Signed)
Patient called back. Reports he has been healing from a fall a few weeks ago so he has been sleeping in his living room. Currently sitting in his bedroom and able to send a manual transmission. Initiated Teaching laboratory technician. Advised I will call back in about 43min to provide further assistance. Pt in agreement with plan.

## 2019-05-03 NOTE — Telephone Encounter (Signed)
I gave the pt the number to [place] support to get additional help.

## 2019-05-03 NOTE — Telephone Encounter (Signed)
Patient is calling stating that he received a voicemail stating we are not receiving his pacemaker readings. Please Advise.

## 2019-05-04 LAB — CUP PACEART REMOTE DEVICE CHECK
Battery Remaining Longevity: 94 mo
Battery Remaining Percentage: 95.5 %
Battery Voltage: 2.99 V
Brady Statistic AP VP Percent: 7.7 %
Brady Statistic AP VS Percent: 1 %
Brady Statistic AS VP Percent: 92 %
Brady Statistic AS VS Percent: 1 %
Brady Statistic RA Percent Paced: 7.6 %
Brady Statistic RV Percent Paced: 99 %
Date Time Interrogation Session: 20201209160142
Implantable Lead Implant Date: 20160321
Implantable Lead Implant Date: 20160321
Implantable Lead Location: 753859
Implantable Lead Location: 753860
Implantable Pulse Generator Implant Date: 20160321
Lead Channel Impedance Value: 440 Ohm
Lead Channel Impedance Value: 560 Ohm
Lead Channel Pacing Threshold Amplitude: 0.75 V
Lead Channel Pacing Threshold Amplitude: 1.75 V
Lead Channel Pacing Threshold Pulse Width: 0.5 ms
Lead Channel Pacing Threshold Pulse Width: 0.8 ms
Lead Channel Sensing Intrinsic Amplitude: 10.9 mV
Lead Channel Sensing Intrinsic Amplitude: 2.3 mV
Lead Channel Setting Pacing Amplitude: 2.5 V
Lead Channel Setting Pacing Amplitude: 3 V
Lead Channel Setting Pacing Pulse Width: 0.5 ms
Lead Channel Setting Sensing Sensitivity: 8 mV
Pulse Gen Model: 2240
Pulse Gen Serial Number: 7734710

## 2019-05-04 NOTE — Telephone Encounter (Signed)
Transmission received 05-03-2019

## 2019-05-04 NOTE — Telephone Encounter (Signed)
Transmission received.  Normal device function.  Attempted to call patient to let him know, no answer.   Chanetta Marshall, NP 05/04/2019 12:42 PM

## 2019-05-09 ENCOUNTER — Other Ambulatory Visit: Payer: Self-pay | Admitting: Family Medicine

## 2019-05-23 ENCOUNTER — Ambulatory Visit (INDEPENDENT_AMBULATORY_CARE_PROVIDER_SITE_OTHER): Payer: Medicare Other | Admitting: Family Medicine

## 2019-05-23 ENCOUNTER — Other Ambulatory Visit: Payer: Self-pay

## 2019-05-23 ENCOUNTER — Other Ambulatory Visit: Payer: Self-pay | Admitting: Family Medicine

## 2019-05-23 ENCOUNTER — Encounter: Payer: Self-pay | Admitting: Family Medicine

## 2019-05-23 VITALS — BP 128/54 | HR 76 | Temp 97.5°F | Ht 69.0 in | Wt 204.6 lb

## 2019-05-23 DIAGNOSIS — E1122 Type 2 diabetes mellitus with diabetic chronic kidney disease: Secondary | ICD-10-CM

## 2019-05-23 DIAGNOSIS — E119 Type 2 diabetes mellitus without complications: Secondary | ICD-10-CM

## 2019-05-23 LAB — POCT GLYCOSYLATED HEMOGLOBIN (HGB A1C): Hemoglobin A1C: 11.2 % — AB (ref 4.0–5.6)

## 2019-05-23 MED ORDER — BASAGLAR KWIKPEN 100 UNIT/ML ~~LOC~~ SOPN: PEN_INJECTOR | SUBCUTANEOUS | Status: DC

## 2019-05-23 NOTE — Progress Notes (Signed)
This visit occurred during the SARS-CoV-2 public health emergency.  Safety protocols were in place, including screening questions prior to the visit, additional usage of staff PPE, and extensive cleaning of exam room while observing appropriate contact time as indicated for disinfecting solutions.  He fell last month, he thought he broke some L sided ribs.  Scraped his arm.  He didn't go to ER.  He wanted to stay at home and he put up with it.  "It was rough but I'm here and I'm okay."  He was taking the garbage to the street in the rain and he slipped.  No LOC; wife helped him up.   Fall cautions discussed with patient.  Diabetes:  Using medications without difficulties:  Yes but has sugar elevation noted Hypoglycemic episodes: no Hyperglycemic episodes: yes, see low.   Feet problems: some tingling near the balls of the feet B.   Blood Sugars averaging: 200-275 recently.  Lowest was ~190. eye exam within last year: done last week.  Dr. Prudencio Burly.  A1c up, d/w pt at [place].   He is up to 27 units of insulin a day.    Still on amoxil at baseline.    PMH and SH reviewed Meds, vitals, and allergies reviewed.   ROS: Per HPI unless specifically indicated in ROS section   GEN: nad, alert and oriented HEENT: ncat NECK: supple w/o LA CV: rrr. PULM: ctab, no inc wob ABD: soft, +bs EXT: no edema SKIN: no acute rash  Diabetic foot exam: Normal inspection No skin breakdown No calluses  Normal DP pulses Normal sensation to light touch and monofilament Nails normal

## 2019-05-23 NOTE — Patient Instructions (Addendum)
Gradually increase your insulin.  If your AM sugar is above 150, then add 1 unit that night.  Keep increasing until your sugar is between 120 and 150.  When your sugar is between 120 and 150, then don't change your dose.  If below 120, then cut back 1 unit.   Update me about your sugar and insulin dose in about 1 week, sooner if needed.    Labs ahead of your visit in 06/2019.  Take care.  Glad to see you.

## 2019-05-23 NOTE — Telephone Encounter (Signed)
Electronic refill request. Lotrisone Cream Last office visit:   05/23/2019 Last Filled:    30 g 1 04/15/2018   Please advise.

## 2019-05-24 NOTE — Assessment & Plan Note (Signed)
He is going to follow-up with renal clinic in February. A1c up, d/w pt at [place].   He is up to 27 units of insulin a day.    Gradually increase insulin in the meantime.  If AM sugar is above 150, then add 1 unit that night.  Keep increasing until sugar is between 120 and 150.  When sugar is between 120 and 150, then no change in dose.  If below 120, then cut back 1 unit.   He will update me about his sugar and insulin dose in about 1 week, sooner if needed.    Fall cautions discussed with patient.  He will update me as needed.

## 2019-05-24 NOTE — Telephone Encounter (Signed)
Sent. Thanks.   

## 2019-06-02 ENCOUNTER — Telehealth: Payer: Self-pay | Admitting: Family Medicine

## 2019-06-02 NOTE — Telephone Encounter (Signed)
Patient was advised to call back in a week with his BS Readings  Sunday-149 Monday- [place]

## 2019-06-02 NOTE — Telephone Encounter (Signed)
Thanks for the update.  Those look a lot better.  Would continue as is for now but he can still adjust his insulin as needed.    If AM sugar is above 150, then add 1 unit that night.  When sugar is between 120 and 150, then no change in dose.  If below 120, then cut back 1 unit.  Please update me as needed.  Thanks.

## 2019-06-02 NOTE — Telephone Encounter (Signed)
Patient notified as instructed and verbalized understanding.

## 2019-06-06 ENCOUNTER — Telehealth: Payer: Self-pay | Admitting: *Deleted

## 2019-06-06 NOTE — Telephone Encounter (Signed)
Patient notified as instructed by telephone and verbalized understanding. 

## 2019-06-06 NOTE — Telephone Encounter (Signed)
Should be fine to get vaccinated and none of his meds would prevent vaccination.  Thanks.

## 2019-06-06 NOTE — Telephone Encounter (Signed)
Patient left a voicemail stating that he is scheduled to get the covid vaccine in the morning. Patient stated one of the questions they ask is he on a blood thinner? Patient wants to know if it is okay for him to get the vaccine? Patient stated that his wife is also getting the vaccine tomorrow.

## 2019-06-14 ENCOUNTER — Other Ambulatory Visit: Payer: Self-pay | Admitting: Family Medicine

## 2019-06-17 ENCOUNTER — Other Ambulatory Visit: Payer: Self-pay | Admitting: Family Medicine

## 2019-06-21 ENCOUNTER — Ambulatory Visit: Payer: Medicare Other | Admitting: Internal Medicine

## 2019-06-26 NOTE — Progress Notes (Signed)
Cardiology Office Note    Date:  06/27/2019   ID:  Dean Murphy, DOB 08-Aug-1926, MRN [id number]  PCP:  Dean Ghent, MD  Cardiologist: Dean Dawley, MD EPS: Dean Peru, MD  Chief Complaint  Patient presents with  . Follow-up    History of Present Illness:  Dean Murphy is a 86 y.o. male with CAD s/p CABG 1989, CKD stage III-IV, HTN with history of orthostasis, DM, anemia, CHB [id number] (complicated by acute diastolic CHF) s/p PPM, CVA, carotid disease s/p CEAs (followed by [facility]), hyperlipidemia, PAF on Eliquis.echo 08/2017 showed EF 55-60%, HK of apical septal myocardium, grade 1 DD, mild MR, severe LAE, mildly increased PASP   Telemedicine visit with Dean Copa, PA-C 09/2018.  There was a question about whether he was on both amlodipine and diltiazem.  Pacer check 05/03/2019 stable.  Patient comes in for f/u. Over all doing well, still driving. Has had a tough year-mechanical fall and broke some ribs. Has had one covid vaccine so far.Has had a hard time with covid and not socializing.  No bleeding problems on eliquis. No regular exercise but still mows lawn and does yard work. Denies chest pain, dyspnea, dyspnea on exertion, dizziness or presyncope.    Past Medical History:  Diagnosis Date  . Anemia   . Arthritis   . CAD (coronary artery disease)    a. CABG 1989, b. Myoview low risk 2012.  . Carotid artery occlusion    a. Duplex 10/2014: patent R/L CEA with mild hyperplasia in right surgical bulb - followed by vascular.  . CHB (complete heart block) [facility]) March 2016   a. s/p STJ dual chamber pacemaker 07/2014.  [name] Chronic diastolic CHF (congestive heart failure) ([place])    a. Dx [id number] - acute diastolic CHF in the setting of CHB.  [name] Chronic kidney disease, stage IV (severe) (HCC)    stage III/IV as of 2015, [name]  . Colon polyps   . Complication of anesthesia   . Constipation   . CVA (cerebral infarction)   . Diabetes mellitus    type II  . Diverticulosis   .  Dupuytren's disease    finger right hand contracted  . Essential hypertension   . GERD (gastroesophageal reflux disease)   . Hemorrhoids   . Hyperlipidemia   . Myocardial infarction [facility]) 1976  and Mar. 19, 2016  . Orthostasis   . Osteoporosis   . PAF (paroxysmal atrial fibrillation) [facility]) March 2016  . PONV (postoperative nausea and vomiting)   . Presence of permanent cardiac pacemaker   . Shortness of breath dyspnea    due to pain    Past Surgical History:  Procedure Laterality Date  . CARDIAC CATHETERIZATION  08/11/2014   Procedure: TEMPORARY PACEMAKER;  Surgeon: [name], MD;  Location: [facility] CATH LAB;  Service: Cardiovascular;;  . CAROTID ENDARTERECTOMY  12/31/10   Right  . CAROTID ENDARTERECTOMY  02/09/11   left  . CHOLECYSTECTOMY    . CORONARY ARTERY BYPASS GRAFT  1989  . EYE SURGERY Bilateral    Cataract with implants  . I & D KNEE WITH POLY EXCHANGE Left 07/29/2015   Procedure: IRRIGATION AND DEBRIDEMENT LEFT KNEE WITH POLY EXCHANGE;  Surgeon: Rod Can, MD;  Location: [place];  Service: Orthopedics;  Laterality: Left;  . IR GENERIC HISTORICAL  03/04/2016   IR US GUIDE VASC ACCESS RIGHT 03/04/2016 Marybelle Killings, MD [facility]INTER[facility] RAD  . IR GENERIC HISTORICAL  03/04/2016   IR FLUORO  GUIDE CV LINE RIGHT 03/04/2016 Marybelle Killings, MD [facility]INTER[facility]  . JOINT REPLACEMENT     bilat. knees  . PERMANENT PACEMAKER INSERTION N/A 08/13/2014   STJ dual chamber pacemaker implanted by [name] for CHB  . TONSILLECTOMY    . TOTAL KNEE ARTHROPLASTY     bilateral    Current Medications: Current Meds  Medication Sig  . amoxicillin (AMOXIL) 500 MG capsule Take 1 capsule (500 mg total) by mouth 2 (two) times daily.  . clotrimazole-betamethasone (LOTRISONE) cream APPLY TO AFFECTED AREA TWICE A DAY  . diltiazem (CARDIZEM CD) 360 MG 24 hr capsule TAKE ONE CAPSULE BY MOUTH DAILY  . ELIQUIS 2.5 MG TABS tablet TAKE 1 TABLET BY MOUTH TWICE A DAY  . ferrous sulfate 325 (65 FE) MG tablet Take 1  pill on MWF  . glipiZIDE (GLUCOTROL) 5 MG tablet TAKE 2 TABLETS BY MOUTH EVERY MORNING BEFORE BREAKFAST AND 1 TABLET BEFORE SUPPER  . Insulin Glargine (BASAGLAR KWIKPEN) 100 UNIT/ML SOPN INJECT 20 UNITS INTO THE SKIN AT BEDTIME AS DIRECTED.  . Insulin Pen Needle (B-D UF III MINI PEN NEEDLES) 31G X 5 MM MISC USE DAILY WITH INSULIN PEN  . JANUVIA 50 MG tablet TAKE 1 TABLET BY MOUTH EVERY DAY  . losartan (COZAAR) 50 MG tablet Take 50 mg by mouth daily.  [name] lubiprostone (AMITIZA) 8 MCG capsule TAKE ONE CAPSULE BY MOUTH EACH MORNING  . omeprazole (PRILOSEC) 40 MG capsule TAKE 1 CAPSULE BY MOUTH EVERY DAY  . Ophthalmic Irrigation Solution (EYE WASH) 99.05 % SOLN Place 1 drop into both eyes 2 (two) times daily.  . rosuvastatin (CRESTOR) 20 MG tablet TAKE 1 TABLET BY MOUTH EVERY DAY  . torsemide (DEMADEX) 10 MG tablet Take 10 mg by mouth daily.     Allergies:   Zoledronic acid and Nsaids   Social History   Socioeconomic History  . Marital status: Married    Spouse name: Not on file  . Number of children: Not on file  . Years of education: Not on file  . Highest education level: Not on file  Occupational History  . Occupation: retired    Fish farm manager: RETIRED  Tobacco Use  . Smoking status: Former Smoker    Packs/day: 1.00    Years: 25.00    Pack years: 25.00    Types: Cigarettes    Quit date: 01/21/1973    Years since quitting: 46.4  . Smokeless tobacco: Never Used  . Tobacco comment: began smoking in high school, quit age 41  Substance and Sexual Activity  . Alcohol use: Yes    Alcohol/week: 0.0 standard drinks    Comment: wine occ  . Drug use: No  . Sexual activity: Never  Other Topics Concern  . 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  Remarried 1998.     3 sons   Social Determinants of Health   Financial Resource Strain:   . Difficulty of Paying Living Expenses: Not on file  Food Insecurity:   .  Worried About Charity fundraiser in the Last Year: Not on file  . Ran Out of Food in the Last Year: Not on file  Transportation Needs:   . Lack of Transportation (Medical): Not on file  . Lack of Transportation (Non-Medical): Not on file  Physical Activity:   . Days of Exercise per Week: Not on file  . Minutes of  Exercise per Session: Not on file  Stress:   . Feeling of Stress : Not on file  Social Connections:   . Frequency of Communication with Friends and Family: Not on file  . Frequency of Social Gatherings with Friends and Family: Not on file  . Attends Religious Services: Not on file  . Active Member of Clubs or Organizations: Not on file  . Attends Archivist Meetings: Not on file  . Marital Status: Not on file     Family History:  The patient's   family history includes Cancer in an other family member; Diabetes in his mother, son, son, and another family member; Heart attack in his brother, father, and mother; Heart disease in his brother, father, and mother; Hypertension in his brother, father, son, and son; Varicose Veins in his mother.   ROS:   Please see the history of present illness.    ROS All other systems reviewed and are negative.   PHYSICAL EXAM:   VS:  BP (!) 130/56   Pulse 73   Ht 5\' 9"  (1.753 m)   Wt 204 lb (92.5 kg)   BMI 30.13 kg/m   Physical Exam  GEN: Well nourished, well developed, in no acute distress  Neck: no JVD, carotid bruits, or masses Cardiac:RRR; S4  Respiratory:  clear to auscultation bilaterally, normal work of breathing GI: soft, nontender, nondistended, + BS Ext: without cyanosis, clubbing, or edema, Good distal pulses bilaterally Neuro:  Alert and Oriented x 3 Psych: euthymic mood, full affect  Wt Readings from Last 3 Encounters:  06/27/19 204 lb (92.5 kg)  05/23/19 204 lb 9 oz (92.8 kg)  01/20/19 201 lb 8 oz (91.4 kg)      Studies/Labs Reviewed:   EKG:  EKG is  ordered today.  The ekg ordered today demonstrates  Paced at 73/m  Recent Labs: 10/14/2018: Hemoglobin 14.7; Platelets 128.0 10/19/2018: BUN 23; Creatinine, Ser 1.75; Potassium 5.1; Sodium 143   Lipid Panel    Component Value Date/Time   CHOL 166 04/11/2018 0812   TRIG 175.0 (H) 04/11/2018 0812   TRIG 62 04/28/2006 0917   HDL 45.00 04/11/2018 0812   CHOLHDL 4 04/11/2018 0812   VLDL 35.0 04/11/2018 0812   LDLCALC 86 04/11/2018 0812   LDLDIRECT 135.1 03/12/2014 0812    Additional studies/ records that were reviewed today include:   2D echo 4/2019Study Conclusions   - Left ventricle: The cavity size was normal. There was moderate    concentric hypertrophy. Systolic function was normal. The    estimated ejection fraction was in the range of 55% to 60%.    Hypokinesis of the apical septal myocardium. Doppler parameters    are consistent with abnormal left ventricular relaxation (grade 1    diastolic dysfunction). Doppler parameters are consistent with    indetermiante ventricular filling pressure.  - Aortic valve: Transvalvular velocity was within the normal range.    There was no stenosis. There was no regurgitation.  - Mitral valve: Transvalvular velocity was within the normal range.    There was no evidence for stenosis. There was mild regurgitation.  - Left atrium: The atrium was severely dilated.  - Right ventricle: The cavity size was normal. Wall thickness was    normal. Systolic function was normal.  - Atrial septum: No defect or patent foramen ovale was identified    by color flow Doppler.  - Tricuspid valve: There was trivial regurgitation.  - Pulmonary arteries: Systolic pressure was mildly increased. PA  peak pressure: 41 mm Hg (S).     ASSESSMENT:    1. Coronary artery disease involving coronary bypass graft of native heart without angina pectoris   2. Paroxysmal atrial fibrillation (HCC)   3. S/P placement of cardiac pacemaker   4. Essential hypertension   5. Hyperlipidemia, unspecified hyperlipidemia type    6. Chronic diastolic CHF (congestive heart failure) (HCC)   7. Stage 3 chronic kidney disease, unspecified whether stage 3a or 3b CKD   8. Carotid artery disease, unspecified laterality, unspecified type ([place])      PLAN:  In order of problems listed above:  CAD status post CABG 1989-no angina, on ASA and crestor  PAF on Eliquis and diltiazem-labs checked by renal last week. No palpitations.  Complete heart block status post pacemaker followed by Dr. Lovena Le pacer check 04/2019 stable  Essential hypertension with history of orthostasis-BP controlled  CKD stage III-IV followed by renal  Hyperlipidemia on crestor managed by pcp  Chronic diastolic CHF last echo [id number] normal LVEF with grade 1 DD   Carotid disease-dopplers stable 2019. Doesn't want repeated at this time.   Medication Adjustments/Labs and Tests Ordered: Current medicines are reviewed at length with the patient today.  Concerns regarding medicines are outlined above.  Medication changes, Labs and Tests ordered today are listed in the Patient Instructions below. Patient Instructions  Medication Instructions:  Your physician recommends that you continue on your current medications as directed. Please refer to the Current Medication list given to you today.  *If you need a refill on your cardiac medications before your next appointment, please call your pharmacy*  Lab Work: None Ordered If you have labs (blood work) drawn today and your tests are completely normal, you will receive your results only by: [name] MyChart Message (if you have MyChart) OR . A paper copy in the mail If you have any lab test that is abnormal or we need to change your treatment, we will call you to review the results.   Testing/Procedures: None Ordered   Follow-Up: At [facility], you and your health needs are our priority.  As part of our continuing mission to provide you with exceptional heart care, we have created designated Provider Care  Teams.  These Care Teams include your primary Cardiologist (physician) and Advanced Practice Providers (APPs -  Physician Assistants and Nurse Practitioners) who all work together to provide you with the care you need, when you need it.  Your next appointment:   1 year(s)  The format for your next appointment:   In Person  Provider:   Ena Dawley, MD       Signed, Ermalinda Barrios, PA-C  06/27/2019 10:39 AM    [address], [place], [place]  [address] Phone: [phone]; Fax: [phone]

## 2019-06-27 ENCOUNTER — Ambulatory Visit: Payer: Medicare Other | Admitting: Physician Assistant

## 2019-06-27 ENCOUNTER — Other Ambulatory Visit: Payer: Self-pay

## 2019-06-27 ENCOUNTER — Encounter: Payer: Self-pay | Admitting: Physician Assistant

## 2019-06-27 VITALS — BP 130/56 | HR 73 | Ht 69.0 in | Wt 204.0 lb

## 2019-06-27 DIAGNOSIS — N183 Chronic kidney disease, stage 3 unspecified: Secondary | ICD-10-CM

## 2019-06-27 DIAGNOSIS — I25810 Atherosclerosis of coronary artery bypass graft(s) without angina pectoris: Secondary | ICD-10-CM

## 2019-06-27 DIAGNOSIS — I10 Essential (primary) hypertension: Secondary | ICD-10-CM | POA: Diagnosis not present

## 2019-06-27 DIAGNOSIS — E785 Hyperlipidemia, unspecified: Secondary | ICD-10-CM

## 2019-06-27 DIAGNOSIS — I480 Paroxysmal atrial fibrillation: Secondary | ICD-10-CM | POA: Diagnosis not present

## 2019-06-27 DIAGNOSIS — I5032 Chronic diastolic (congestive) heart failure: Secondary | ICD-10-CM

## 2019-06-27 DIAGNOSIS — Z950 Presence of cardiac pacemaker: Secondary | ICD-10-CM | POA: Diagnosis not present

## 2019-06-27 DIAGNOSIS — I779 Disorder of arteries and arterioles, unspecified: Secondary | ICD-10-CM

## 2019-06-27 NOTE — Patient Instructions (Signed)
Medication Instructions:  Your physician recommends that you continue on your current medications as directed. Please refer to the Current Medication list given to you today.  *If you need a refill on your cardiac medications before your next appointment, please call your pharmacy*  Lab Work: None Ordered If you have labs (blood work) drawn today and your tests are completely normal, you will receive your results only by: [name] MyChart Message (if you have MyChart) OR . A paper copy in the mail If you have any lab test that is abnormal or we need to change your treatment, we will call you to review the results.   Testing/Procedures: None Ordered   Follow-Up: At [facility], you and your health needs are our priority.  As part of our continuing mission to provide you with exceptional heart care, we have created designated Provider Care Teams.  These Care Teams include your primary Cardiologist (physician) and Advanced Practice Providers (APPs -  Physician Assistants and Nurse Practitioners) who all work together to provide you with the care you need, when you need it.  Your next appointment:   1 year(s)  The format for your next appointment:   In Person  Provider:   Ena Dawley, MD

## 2019-06-28 ENCOUNTER — Ambulatory Visit: Payer: Medicare Other | Admitting: Internal Medicine

## 2019-06-28 ENCOUNTER — Encounter: Payer: Self-pay | Admitting: Internal Medicine

## 2019-06-28 DIAGNOSIS — T8454XD Infection and inflammatory reaction due to internal left knee prosthesis, subsequent encounter: Secondary | ICD-10-CM

## 2019-06-28 MED ORDER — AMOXICILLIN 500 MG PO CAPS: 500.0000 mg | ORAL_CAPSULE | Freq: Two times a day (BID) | ORAL | 3 refills | Status: DC

## 2019-06-28 NOTE — Progress Notes (Signed)
[place] for Infectious Disease  Patient Active Problem List   Diagnosis Date Noted  . Infection of prosthetic left knee joint ([place]) 07/29/2015    Priority: High  . Healthcare maintenance 07/21/2018  . Advance care planning 07/21/2018  . Constipation   . Low back pain 02/02/2018  . Bruise 12/19/2017  . Coronary artery disease 07/29/2015  . Stroke ([place]) 12/24/2014  . Double vision 12/23/2014  . Pacemaker 11/16/2014  . Anemia 09/27/2014  . Gastrointestinal hemorrhage associated with peptic ulcer 09/07/2014  . Atrial fibrillation-CHADS VASC2 = 5 (age, HTN, DM, Vasc) 08/13/2014  . Chest pain 08/11/2014  . Complete heart block ([place]) 08/11/2014  . S/P CABG x 5 1989. Low risk Myoview 2012 08/11/2014  . Syncope 08/11/2014  . Cough 06/14/2014  . Left foot pain 02/23/2014  . Gouty arthropathy 08/09/2013  . Chronic radicular lumbar pain 12/15/2012  . PVD- s/p bilat CEA- CA dopplers OK Jan 2015 10/06/2012  . Insomnia 09/30/2011  . OTHER SPECIFIED SITES OF SPRAINS AND STRAINS 04/25/2010  . DIVERTICULITIS OF COLON 02/21/2010  . TEMPOROMANDIBULAR JOINT DISORDER 11/22/2009  . DM (diabetes mellitus), type 2 with renal complications ([place]) [id number]  . Chronic kidney disease, stage III (moderate) 07/26/2008  . CHRONIC PROSTATITIS 07/26/2008  . ADVEF, DRUG/MEDICINAL/BIOLOGICAL SUBST NOS 03/16/2007  . Dyslipidemia 11/15/2006  . Essential hypertension 11/15/2006  . GERD 11/15/2006  . Osteoarthritis 11/15/2006    Patient's Medications  New Prescriptions   No medications on file  Previous Medications   CLOTRIMAZOLE-BETAMETHASONE (LOTRISONE) CREAM    APPLY TO AFFECTED AREA TWICE A DAY   DILTIAZEM (CARDIZEM CD) 360 MG 24 HR CAPSULE    TAKE ONE CAPSULE BY MOUTH DAILY   ELIQUIS 2.5 MG TABS TABLET    TAKE 1 TABLET BY MOUTH TWICE A DAY   FERROUS SULFATE 325 (65 FE) MG TABLET    Take 1 pill on MWF   GLIPIZIDE (GLUCOTROL) 5 MG TABLET    TAKE 2 TABLETS BY MOUTH EVERY MORNING  BEFORE BREAKFAST AND 1 TABLET BEFORE SUPPER   INSULIN GLARGINE (BASAGLAR KWIKPEN) 100 UNIT/ML SOPN    INJECT 20 UNITS INTO THE SKIN AT BEDTIME AS DIRECTED.   INSULIN PEN NEEDLE (B-D UF III MINI PEN NEEDLES) 31G X 5 MM MISC    USE DAILY WITH INSULIN PEN   JANUVIA 50 MG TABLET    TAKE 1 TABLET BY MOUTH EVERY DAY   LOSARTAN (COZAAR) 50 MG TABLET    Take 50 mg by mouth daily.   LUBIPROSTONE (AMITIZA) 8 MCG CAPSULE    TAKE ONE CAPSULE BY MOUTH EACH MORNING   OMEPRAZOLE (PRILOSEC) 40 MG CAPSULE    TAKE 1 CAPSULE BY MOUTH EVERY DAY   OPHTHALMIC IRRIGATION SOLUTION (EYE WASH) 99.05 % SOLN    Place 1 drop into both eyes 2 (two) times daily.   ROSUVASTATIN (CRESTOR) 20 MG TABLET    TAKE 1 TABLET BY MOUTH EVERY DAY   TORSEMIDE (DEMADEX) 10 MG TABLET    Take 10 mg by mouth daily.  Modified Medications   Modified Medication Previous Medication   AMOXICILLIN (AMOXIL) 500 MG CAPSULE amoxicillin (AMOXIL) 500 MG capsule      Take 1 capsule (500 mg total) by mouth 2 (two) times daily.    Take 1 capsule (500 mg total) by mouth 2 (two) times daily.  Discontinued Medications   No medications on file    Subjective: [name] is in for his routine follow-up visit.  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  He has had no problems tolerating his amoxicillin.  He and his wife have been socially distancing at home during the Covid pandemic.  He has found that quite isolating and difficult.  He has already received his first Covid vaccine.  Review of Systems: Review of Systems  Constitutional: Negative for chills, diaphoresis and fever.  Gastrointestinal: Negative for abdominal pain, diarrhea, nausea and vomiting.  Musculoskeletal: Positive for joint pain.    Past Medical History:  Diagnosis Date  . Anemia   . Arthritis   . CAD (coronary artery disease)    a. CABG 1989, b. Myoview low risk 2012.  . Carotid artery  occlusion    a. Duplex 10/2014: patent R/L CEA with mild hyperplasia in right surgical bulb - followed by vascular.  . CHB (complete heart block) [facility]) March 2016   a. s/p STJ dual chamber pacemaker 07/2014.  [name] Chronic diastolic CHF (congestive heart failure) ([place])    a. Dx [id number] - acute diastolic CHF in the setting of CHB.  [name] Chronic kidney disease, stage IV (severe) (HCC)    stage III/IV as of 2015, [name]  . Colon polyps   . Complication of anesthesia   . Constipation   . CVA (cerebral infarction)   . Diabetes mellitus    type II  . Diverticulosis   . Dupuytren's disease    finger right hand contracted  . Essential hypertension   . GERD (gastroesophageal reflux disease)   . Hemorrhoids   . Hyperlipidemia   . Myocardial infarction [facility]) 1976  and Mar. 19, 2016  . Orthostasis   . Osteoporosis   . PAF (paroxysmal atrial fibrillation) [facility]) March 2016  . PONV (postoperative nausea and vomiting)   . Presence of permanent cardiac pacemaker   . Shortness of breath dyspnea    due to pain    Social History   Tobacco Use  . Smoking status: Former Smoker    Packs/day: 1.00    Years: 25.00    Pack years: 25.00    Types: Cigarettes    Quit date: 01/21/1973    Years since quitting: 46.4  . Smokeless tobacco: Never Used  . Tobacco comment: began smoking in high school, quit age 50  Substance Use Topics  . Alcohol use: Yes    Alcohol/week: 0.0 standard drinks    Comment: wine occ  . Drug use: No    Family History  Problem Relation Age of Onset  . Heart disease Mother        Before age 14  . Diabetes Mother   . Varicose Veins Mother   . Heart attack Mother   . Heart attack Father   . Heart disease Father        After age 72  . Hypertension Father   . Heart disease Brother        Before age 3  . Hypertension Brother   . Heart attack Brother   . Diabetes Son   . Hypertension Son   . Hypertension Son   . Diabetes Son   . Diabetes Other   . Cancer Other    . Colon cancer Neg Hx     Allergies  Allergen Reactions  . Zoledronic Acid Other (See Comments)    Stopped 2015 due to Creatine  Other reaction(s): Unknown Stopped 2015 due to Cr  . Nsaids Other (See Comments)  Held due to creatinine elevation.      Objective: Vitals:   06/28/19 0930  BP: (!) 168/69  Pulse: 91  Temp: 98 F (36.7 C)  TempSrc: Oral  SpO2: 96%  Weight: 200 lb (90.7 kg)  Height: 5\' 9"  (1.753 m)   Body mass index is 29.53 kg/m.  Physical Exam  Constitutional: No distress.  Musculoskeletal:     Comments: His left knee incision is well-healed without any signs of active infection.    Neurological: He is alert.  Skin: No rash noted.  Psychiatric: Mood and affect normal.    Lab Results    Problem List Items Addressed This Visit      High   Infection of prosthetic left knee joint ([place])    He is doing very well on chronic suppressive therapy for his previous enterococcal left prosthetic knee infection.  He has had no problems tolerating amoxicillin and prefers to continue it at this time.  He knows to call me if he develops diarrhea or other potential side effects.  Otherwise, he will follow-up in 1 year.      Relevant Medications   amoxicillin (AMOXIL) 500 MG capsule       Michel Bickers, MD [facility] for Infectious [place] [phone] pager   [phone] cell 06/28/2019, 9:52 AM

## 2019-06-28 NOTE — Assessment & Plan Note (Signed)
He is doing very well on chronic suppressive therapy for his previous enterococcal left prosthetic knee infection.  He has had no problems tolerating amoxicillin and prefers to continue it at this time.  He knows to call me if he develops diarrhea or other potential side effects.  Otherwise, he will follow-up in 1 year.

## 2019-07-02 ENCOUNTER — Other Ambulatory Visit: Payer: Self-pay | Admitting: Family Medicine

## 2019-07-02 DIAGNOSIS — M109 Gout, unspecified: Secondary | ICD-10-CM

## 2019-07-02 DIAGNOSIS — E119 Type 2 diabetes mellitus without complications: Secondary | ICD-10-CM

## 2019-07-11 ENCOUNTER — Other Ambulatory Visit: Payer: Self-pay | Admitting: *Deleted

## 2019-07-11 MED ORDER — INSULIN GLARGINE 100 UNIT/ML ~~LOC~~ SOLN: SUBCUTANEOUS | 3 refills | Status: DC

## 2019-07-11 MED ORDER — INSULIN GLARGINE 100 UNITS/ML SOLOSTAR PEN: PEN_INJECTOR | SUBCUTANEOUS | 3 refills | Status: DC

## 2019-07-11 MED ORDER — INSULIN GLARGINE 100 UNITS/ML SOLOSTAR PEN: 20.0000 [IU] | PEN_INJECTOR | Freq: Every day | SUBCUTANEOUS | 3 refills | Status: DC

## 2019-07-12 ENCOUNTER — Other Ambulatory Visit (INDEPENDENT_AMBULATORY_CARE_PROVIDER_SITE_OTHER): Payer: Medicare Other

## 2019-07-12 ENCOUNTER — Ambulatory Visit (INDEPENDENT_AMBULATORY_CARE_PROVIDER_SITE_OTHER): Payer: Medicare Other

## 2019-07-12 ENCOUNTER — Other Ambulatory Visit: Payer: Self-pay

## 2019-07-12 ENCOUNTER — Ambulatory Visit: Payer: Medicare Other

## 2019-07-12 VITALS — BP 135/71 | Wt 200.0 lb

## 2019-07-12 DIAGNOSIS — E119 Type 2 diabetes mellitus without complications: Secondary | ICD-10-CM | POA: Diagnosis not present

## 2019-07-12 DIAGNOSIS — M109 Gout, unspecified: Secondary | ICD-10-CM | POA: Diagnosis not present

## 2019-07-12 DIAGNOSIS — Z0000 Encounter for general adult medical examination without abnormal findings: Secondary | ICD-10-CM | POA: Diagnosis not present

## 2019-07-12 LAB — HEMOGLOBIN A1C: Hgb A1c MFr Bld: 9.5 % — ABNORMAL HIGH (ref 4.6–6.5)

## 2019-07-12 LAB — LIPID PANEL
Cholesterol: 122 mg/dL (ref 0–200)
HDL: 33.3 mg/dL — ABNORMAL LOW (ref >39.00)
LDL Cholesterol: 60 mg/dL (ref 0–99)
NonHDL: 88.97
Total CHOL/HDL Ratio: 4
Triglycerides: 143 mg/dL (ref 0.0–149.0)
VLDL: 28.6 mg/dL (ref 0.0–40.0)

## 2019-07-12 LAB — URIC ACID: Uric Acid, Serum: 7.5 mg/dL (ref 4.0–7.8)

## 2019-07-12 LAB — TSH: TSH: 3.28 u[IU]/mL (ref 0.35–4.50)

## 2019-07-12 NOTE — Progress Notes (Addendum)
I reviewed health advisor's note, was available for consultation, and agree with documentation and plan.  Plans are noted in the LPNs charting.  Signed,  Maud Deed. Copland, MD   PCP notes:  Health Maintenance: Needs foot exam   Abnormal Screenings: MMSE score- 16   Patient concerns: none   Nurse concerns: none   Next PCP appt.: 07/20/2019 @ 8:30 am

## 2019-07-12 NOTE — Progress Notes (Addendum)
Subjective:   MCCARTNEY MONTOUR is a 86 y.o. male who presents for Medicare Annual/Subsequent preventive examination.  Review of Systems: N/A   This visit is being conducted through telemedicine via telephone at the nurse health advisor's home address due to the COVID-19 pandemic. This patient has given me verbal consent via doximity to conduct this visit, patient states they are participating from their home address. Patient and myself are on the telephone call. There is no referral for this visit. Some vital signs may be absent or patient reported.    Patient identification: identified by name, DOB, and current address   Cardiac Risk Factors include: advanced age (>51men, >42 women);diabetes mellitus;hypertension;dyslipidemia;male gender     Objective:    Vitals: BP 135/71   Wt 200 lb (90.7 kg)   BMI 29.53 kg/m   Body mass index is 29.53 kg/m.  Advanced Directives 07/12/2019 07/11/2018 07/07/2018 11/05/2016 06/04/2016 10/31/2015 08/02/2015  Does Patient Have a Medical Advance Directive? No No Yes Yes Yes Yes No  Type of Advance Directive - - Living will;Healthcare Power of [place] will -  Does patient want to make changes to medical advance directive? - - - - - No - Patient declined -  Copy of [place] in Chart? - - No - copy requested - No - copy requested No - copy requested -  Would patient like information on creating a medical advance directive? No - Patient declined - No - Patient declined - - - No - patient declined information    Tobacco Social History   Tobacco Use  Smoking Status Former Smoker  . Packs/day: 1.00  . Years: 25.00  . Pack years: 25.00  . Types: Cigarettes  . Quit date: 01/21/1973  . Years since quitting: 46.5  Smokeless Tobacco Never Used  Tobacco Comment   began smoking in high school, quit age 42     Counseling given: Not Answered Comment: began smoking in high school,  quit age 56   Clinical Intake:  Pre-visit preparation completed: Yes  Pain : No/denies pain Pain Score: 0-No pain     Nutritional Risks: None Diabetes: Yes CBG done?: No Did pt. bring in CBG monitor from home?: No  How often do you need to have someone help you when you read instructions, pamphlets, or other written materials from your doctor or pharmacy?: 1 - Never What is the last grade level you completed in school?: 12th  Interpreter Needed?: No  Information entered by :: cJohnson, LPN  Past Medical History:  Diagnosis Date  . Anemia   . Arthritis   . CAD (coronary artery disease)    a. CABG 1989, b. Myoview low risk 2012.  . Carotid artery occlusion    a. Duplex 10/2014: patent R/L CEA with mild hyperplasia in right surgical bulb - followed by vascular.  . CHB (complete heart block) [facility]) March 2016   a. s/p STJ dual chamber pacemaker 07/2014.  [name] Chronic diastolic CHF (congestive heart failure) ([place])    a. Dx [id number] - acute diastolic CHF in the setting of CHB.  [name] Chronic kidney disease, stage IV (severe) (HCC)    stage III/IV as of 2015, [name]  . Colon polyps   . Complication of anesthesia   . Constipation   . CVA (cerebral infarction)   . Diabetes mellitus    type II  . Diverticulosis   . Dupuytren's disease    finger  right hand contracted  . Essential hypertension   . GERD (gastroesophageal reflux disease)   . Hemorrhoids   . Hyperlipidemia   . Myocardial infarction [facility]) 1976  and Mar. 19, 2016  . Orthostasis   . Osteoporosis   . PAF (paroxysmal atrial fibrillation) [facility]) March 2016  . PONV (postoperative nausea and vomiting)   . Presence of permanent cardiac pacemaker   . Shortness of breath dyspnea    due to pain   Past Surgical History:  Procedure Laterality Date  . CARDIAC CATHETERIZATION  08/11/2014   Procedure: TEMPORARY PACEMAKER;  Surgeon: [name], MD;  Location: [facility] CATH LAB;  Service: Cardiovascular;;  . CAROTID  ENDARTERECTOMY  12/31/10   Right  . CAROTID ENDARTERECTOMY  02/09/11   left  . CHOLECYSTECTOMY    . CORONARY ARTERY BYPASS GRAFT  1989  . EYE SURGERY Bilateral    Cataract with implants  . I & D KNEE WITH POLY EXCHANGE Left 07/29/2015   Procedure: IRRIGATION AND DEBRIDEMENT LEFT KNEE WITH POLY EXCHANGE;  Surgeon: Rod Can, MD;  Location: [place];  Service: Orthopedics;  Laterality: Left;  . IR GENERIC HISTORICAL  03/04/2016   IR US GUIDE VASC ACCESS RIGHT 03/04/2016 Marybelle Killings, MD [facility]-INTERV RAD  . IR GENERIC HISTORICAL  03/04/2016   IR FLUORO GUIDE CV LINE RIGHT 03/04/2016 Marybelle Killings, MD [facility]-INTERV RAD  . JOINT REPLACEMENT     bilat. knees  . PERMANENT PACEMAKER INSERTION N/A 08/13/2014   STJ dual chamber pacemaker implanted by [name] for CHB  . TONSILLECTOMY    . TOTAL KNEE ARTHROPLASTY     bilateral   Family History  Problem Relation Age of Onset  . Heart disease Mother        Before age 89  . Diabetes Mother   . Varicose Veins Mother   . Heart attack Mother   . Heart attack Father   . Heart disease Father        After age 63  . Hypertension Father   . Heart disease Brother        Before age 51  . Hypertension Brother   . Heart attack Brother   . Diabetes Son   . Hypertension Son   . Hypertension Son   . Diabetes Son   . Diabetes Other   . Cancer Other   . Colon cancer Neg Hx    Social History   Socioeconomic History  . Marital status: Married    Spouse name: Not on file  . Number of children: Not on file  . Years of education: Not on file  . Highest education level: Not on file  Occupational History  . Occupation: retired    Fish farm manager: RETIRED  Tobacco Use  . Smoking status: Former Smoker    Packs/day: 1.00    Years: 25.00    Pack years: 25.00    Types: Cigarettes    Quit date: 01/21/1973    Years since quitting: 46.5  . Smokeless tobacco: Never Used  . Tobacco comment: began smoking in high school, quit age 81  Substance and Sexual Activity  .  Alcohol use: Yes    Alcohol/week: 0.0 standard drinks    Comment: wine occ  . Drug use: No  . Sexual activity: Never  Other Topics Concern  . 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  Remarried 1998.  3 sons   Social Determinants of Health   Financial Resource Strain: Low Risk   . Difficulty of Paying Living Expenses: Not hard at all  Food Insecurity: No Food Insecurity  . Worried About Charity fundraiser in the Last Year: Never true  . Ran Out of Food in the Last Year: Never true  Transportation Needs: No Transportation Needs  . Lack of Transportation (Medical): No  . Lack of Transportation (Non-Medical): No  Physical Activity: Inactive  . Days of Exercise per Week: 0 days  . Minutes of Exercise per Session: 0 min  Stress: No Stress Concern Present  . Feeling of Stress : Not at all  Social Connections:   . Frequency of Communication with Friends and Family: Not on file  . Frequency of Social Gatherings with Friends and Family: Not on file  . Attends Religious Services: Not on file  . Active Member of Clubs or Organizations: Not on file  . Attends Archivist Meetings: Not on file  . Marital Status: Not on file    Outpatient Encounter Medications as of 07/12/2019  Medication Sig  . amoxicillin (AMOXIL) 500 MG capsule Take 1 capsule (500 mg total) by mouth 2 (two) times daily.  . clotrimazole-betamethasone (LOTRISONE) cream APPLY TO AFFECTED AREA TWICE A DAY  . diltiazem (CARDIZEM CD) 360 MG 24 hr capsule TAKE ONE CAPSULE BY MOUTH DAILY  . ELIQUIS 2.5 MG TABS tablet TAKE 1 TABLET BY MOUTH TWICE A DAY  . ferrous sulfate 325 (65 FE) MG tablet Take 1 pill on MWF  . glipiZIDE (GLUCOTROL) 5 MG tablet TAKE 2 TABLETS BY MOUTH EVERY MORNING BEFORE BREAKFAST AND 1 TABLET BEFORE SUPPER  . insulin glargine (LANTUS) 100 unit/mL SOPN Inject 20 units into the skin at bedtime.  Diagnosis:  E11.9   Insulin dependent.  . Insulin Pen Needle (B-D UF III MINI PEN NEEDLES) 31G X 5 MM MISC USE DAILY WITH INSULIN PEN  . JANUVIA 50 MG tablet TAKE 1 TABLET BY MOUTH EVERY DAY  . losartan (COZAAR) 50 MG tablet Take 50 mg by mouth daily.  [name] lubiprostone (AMITIZA) 8 MCG capsule TAKE ONE CAPSULE BY MOUTH EACH MORNING  . omeprazole (PRILOSEC) 40 MG capsule TAKE 1 CAPSULE BY MOUTH EVERY DAY  . Ophthalmic Irrigation Solution (EYE WASH) 99.05 % SOLN Place 1 drop into both eyes 2 (two) times daily.  . rosuvastatin (CRESTOR) 20 MG tablet TAKE 1 TABLET BY MOUTH EVERY DAY  . torsemide (DEMADEX) 10 MG tablet Take 10 mg by mouth daily.   No facility-administered encounter medications on file as of 07/12/2019.    Activities of Daily Living In your present state of health, do you have any difficulty performing the following activities: 07/12/2019  Hearing? N  Vision? N  Difficulty concentrating or making decisions? N  Walking or climbing stairs? N  Dressing or bathing? N  Doing errands, shopping? N  Preparing Food and eating ? N  Using the Toilet? N  In the past six months, have you accidently leaked urine? N  Do you have problems with loss of bowel control? N  Managing your Medications? N  Managing your Finances? N  Housekeeping or managing your Housekeeping? N  Some recent data might be hidden    Patient Care Team: Tonia Ghent, MD as PCP - General (Family Medicine) Dorothy Spark, MD as PCP - Cardiology (Cardiology) Evans Lance, MD as PCP - Electrophysiology (Cardiology) Murlean Iba, MD (Internal Medicine) Katy Apo, MD  as Consulting Physician (Ophthalmology) Dorothy Spark, MD as Consulting Physician (Cardiology) Eula Listen, DDS as Referring Physician (Dentistry) Danella Sensing, MD as Consulting Physician (Dermatology) Rod Can, MD as Consulting Physician (Orthopedic Surgery) Evans Lance, MD as Consulting Physician (Cardiology) Michel Bickers, MD as  Consulting Physician (Infectious Diseases)   Assessment:   This is a routine wellness examination for [place].  Exercise Activities and Dietary recommendations Current Exercise Habits: The patient does not participate in regular exercise at present, Exercise limited by: None identified  Goals    . Increase water intake     Starting 07/07/2018, I will continue to drink at least 8 oz water with each meal daily.     . Patient Stated     07/12/2019, I will maintain and continue medications as prescribed.       Fall Risk Fall Risk  07/12/2019 06/28/2019 07/07/2018 06/10/2017 12/08/2016  Falls in the past year? 1 1 0 No Yes  Comment tripped and fell - - - -  Number falls in past yr: 0 1 - - 1  Injury with Fall? 1 1 - - No  Comment broke 2 ribs - - - -  Risk for fall due to : Medication side effect History of fall(s) - - -  Follow up Falls evaluation completed;Falls prevention discussed Falls evaluation completed - - Falls prevention discussed   Is the patient's home free of loose throw rugs in walkways, pet beds, electrical cords, etc?   yes      Grab bars in the bathroom? yes      Handrails on the stairs?   yes      Adequate lighting?   yes  Timed Get Up and Go Performed: N/A  Depression Screen PHQ 2/9 Scores 07/12/2019 06/28/2019 07/07/2018 06/10/2017  PHQ - 2 Score 0 0 0 0  PHQ- 9 Score 0 - 0 -    Cognitive Function MMSE - Mini Mental State Exam 07/12/2019 07/07/2018 06/04/2016  Orientation to time 4 5 5   Orientation to Place 5 5 5   Registration 3 3 3   Attention/ Calculation 0 0 0  Recall 3 3 3   Language- name 2 objects - 0 0  Language- repeat 1 1 1   Language- follow 3 step command - 3 3  Language- read & follow direction - 0 0  Write a sentence - 0 0  Copy design - 0 0  Total score - 20 20  Mini Cog  Mini-Cog screen was completed. Maximum score is 22. A value of 0 denotes this part of the MMSE was not completed or the patient failed this part of the Mini-Cog screening.        Immunization History  Administered Date(s) Administered  . Fluad Quad(high Dose 65+) 01/20/2019  . Influenza Split 02/03/2012  . Influenza Whole 03/16/2007, 01/23/2010  . Influenza, High Dose Seasonal PF 03/27/2013, 02/17/2018  . Influenza,inj,Quad PF,6+ Mos 03/15/2014, 03/15/2015, 02/27/2016  . Influenza-Unspecified 03/19/2017, 02/17/2018  . Pneumococcal Conjugate-13 06/24/2015  . Pneumococcal Polysaccharide-23 09/24/2009  . Td 07/26/2008    Qualifies for Shingles Vaccine: Yes   Screening Tests Health Maintenance  Topic Date Due  . FOOT EXAM  04/16/2019  . TETANUS/TDAP  07/11/2020 (Originally 07/27/2018)  . HEMOGLOBIN A1C  11/21/2019  . OPHTHALMOLOGY EXAM  05/10/2020  . INFLUENZA VACCINE  Completed  . PNA vac Low Risk Adult  Completed   Cancer Screenings: Lung: Low Dose CT Chest recommended if Age 39-80 years, 30 pack-year currently smoking OR have quit w/in  15 years. Patient does not qualify. Colorectal: no longer required  Additional Screenings:  Hepatitis C Screening: N/A      Plan:   Patient will maintain and continue medications as prescribed.   I have personally reviewed and noted the following in the patient's chart:   . Medical and social history . Use of alcohol, tobacco or illicit drugs  . Current medications and supplements . Functional ability and status . Nutritional status . Physical activity . Advanced directives . List of other physicians . Hospitalizations, surgeries, and ER visits in previous 12 months . Vitals . Screenings to include cognitive, depression, and falls . Referrals and appointments  In addition, I have reviewed and discussed with patient certain preventive protocols, quality metrics, and best practice recommendations. A written personalized care plan for preventive services as well as general preventive health recommendations were provided to patient.     Andrez Grime, LPN  [id number]

## 2019-07-12 NOTE — Patient Instructions (Signed)
[name] , Thank you for taking time to come for your Medicare Wellness Visit. I appreciate your ongoing commitment to your health goals. Please review the following plan we discussed and let me know if I can assist you in the future.   Screening recommendations/referrals: Colonoscopy: no longer required Recommended yearly ophthalmology/optometry visit for glaucoma screening and checkup Recommended yearly dental visit for hygiene and checkup  Vaccinations: Influenza vaccine: Up to date, completed 01/20/2019  Pneumococcal vaccine: Completed series Tdap vaccine: decline Shingles vaccine: discussed    Advanced directives: Advance directive discussed with you today. Even though you declined this today please call our office should you change your mind and we can give you the proper paperwork for you to fill out.  Conditions/risks identified: DM, HTN, hyperlipidemia  Next appointment: 07/20/2019 @ 8:30 am  Preventive Care 17 Years and Older, Male Preventive care refers to lifestyle choices and visits with your health care provider that can promote health and wellness. What does preventive care include?  A yearly physical exam. This is also called an annual well check.  Dental exams once or twice a year.  Routine eye exams. Ask your health care provider how often you should have your eyes checked.  Personal lifestyle choices, including:  Daily care of your teeth and gums.  Regular physical activity.  Eating a healthy diet.  Avoiding tobacco and drug use.  Limiting alcohol use.  Practicing safe sex.  Taking low doses of aspirin every day.  Taking vitamin and mineral supplements as recommended by your health care provider. What happens during an annual well check? The services and screenings done by your health care provider during your annual well check will depend on your age, overall health, lifestyle risk factors, and family history of disease. Counseling  Your health care  provider may ask you questions about your:  Alcohol use.  Tobacco use.  Drug use.  Emotional well-being.  Home and relationship well-being.  Sexual activity.  Eating habits.  History of falls.  Memory and ability to understand (cognition).  Work and work Statistician. Screening  You may have the following tests or measurements:  Height, weight, and BMI.  Blood pressure.  Lipid and cholesterol levels. These may be checked every 5 years, or more frequently if you are over 75 years old.  Skin check.  Lung cancer screening. You may have this screening every year starting at age 64 if you have a 30-pack-year history of smoking and currently smoke or have quit within the past 15 years.  Fecal occult blood test (FOBT) of the stool. You may have this test every year starting at age 53.  Flexible sigmoidoscopy or colonoscopy. You may have a sigmoidoscopy every 5 years or a colonoscopy every 10 years starting at age 45.  Prostate cancer screening. Recommendations will vary depending on your family history and other risks.  Hepatitis C blood test.  Hepatitis B blood test.  Sexually transmitted disease (STD) testing.  Diabetes screening. This is done by checking your blood sugar (glucose) after you have not eaten for a while (fasting). You may have this done every 1-3 years.  Abdominal aortic aneurysm (AAA) screening. You may need this if you are a current or former smoker.  Osteoporosis. You may be screened starting at age 76 if you are at high risk. Talk with your health care provider about your test results, treatment options, and if necessary, the need for more tests. Vaccines  Your health care provider may recommend certain vaccines,  such as:  Influenza vaccine. This is recommended every year.  Tetanus, diphtheria, and acellular pertussis (Tdap, Td) vaccine. You may need a Td booster every 10 years.  Zoster vaccine. You may need this after age 41.  Pneumococcal  13-valent conjugate (PCV13) vaccine. One dose is recommended after age 14.  Pneumococcal polysaccharide (PPSV23) vaccine. One dose is recommended after age 11. Talk to your health care provider about which screenings and vaccines you need and how often you need them. This information is not intended to replace advice given to you by your health care provider. Make sure you discuss any questions you have with your health care provider. Document Released: 06/07/2015 Document Revised: 01/29/2016 Document Reviewed: 03/12/2015 Elsevier Interactive Patient Education  2017 [place] Prevention in the Home Falls can cause injuries. They can happen to people of all ages. There are many things you can do to make your home safe and to help prevent falls. What can I do on the outside of my home?  Regularly fix the edges of walkways and driveways and fix any cracks.  Remove anything that might make you trip as you walk through a door, such as a raised step or threshold.  Trim any bushes or trees on the path to your home.  Use bright outdoor lighting.  Clear any walking paths of anything that might make someone trip, such as rocks or tools.  Regularly check to see if handrails are loose or broken. Make sure that both sides of any steps have handrails.  Any raised decks and porches should have guardrails on the edges.  Have any leaves, snow, or ice cleared regularly.  Use sand or salt on walking paths during winter.  Clean up any spills in your garage right away. This includes oil or grease spills. What can I do in the bathroom?  Use night lights.  Install grab bars by the toilet and in the tub and shower. Do not use towel bars as grab bars.  Use non-skid mats or decals in the tub or shower.  If you need to sit down in the shower, use a plastic, non-slip stool.  Keep the floor dry. Clean up any water that spills on the floor as soon as it happens.  Remove soap buildup in the  tub or shower regularly.  Attach bath mats securely with double-sided non-slip rug tape.  Do not have throw rugs and other things on the floor that can make you trip. What can I do in the bedroom?  Use night lights.  Make sure that you have a light by your bed that is easy to reach.  Do not use any sheets or blankets that are too big for your bed. They should not hang down onto the floor.  Have a firm chair that has side arms. You can use this for support while you get dressed.  Do not have throw rugs and other things on the floor that can make you trip. What can I do in the kitchen?  Clean up any spills right away.  Avoid walking on wet floors.  Keep items that you use a lot in easy-to-reach places.  If you need to reach something above you, use a strong step stool that has a grab bar.  Keep electrical cords out of the way.  Do not use floor polish or wax that makes floors slippery. If you must use wax, use non-skid floor wax.  Do not have throw rugs and other things on  the floor that can make you trip. What can I do with my stairs?  Do not leave any items on the stairs.  Make sure that there are handrails on both sides of the stairs and use them. Fix handrails that are broken or loose. Make sure that handrails are as long as the stairways.  Check any carpeting to make sure that it is firmly attached to the stairs. Fix any carpet that is loose or worn.  Avoid having throw rugs at the top or bottom of the stairs. If you do have throw rugs, attach them to the floor with carpet tape.  Make sure that you have a light switch at the top of the stairs and the bottom of the stairs. If you do not have them, ask someone to add them for you. What else can I do to help prevent falls?  Wear shoes that:  Do not have high heels.  Have rubber bottoms.  Are comfortable and fit you well.  Are closed at the toe. Do not wear sandals.  If you use a stepladder:  Make sure that it  is fully opened. Do not climb a closed stepladder.  Make sure that both sides of the stepladder are locked into place.  Ask someone to hold it for you, if possible.  Clearly mark and make sure that you can see:  Any grab bars or handrails.  First and last steps.  Where the edge of each step is.  Use tools that help you move around (mobility aids) if they are needed. These include:  Canes.  Walkers.  Scooters.  Crutches.  Turn on the lights when you go into a dark area. Replace any light bulbs as soon as they burn out.  Set up your furniture so you have a clear path. Avoid moving your furniture around.  If any of your floors are uneven, fix them.  If there are any pets around you, be aware of where they are.  Review your medicines with your doctor. Some medicines can make you feel dizzy. This can increase your chance of falling. Ask your doctor what other things that you can do to help prevent falls. This information is not intended to replace advice given to you by your health care provider. Make sure you discuss any questions you have with your health care provider. Document Released: 03/07/2009 Document Revised: 10/17/2015 Document Reviewed: 06/15/2014 Elsevier Interactive Patient Education  2017 [employer].

## 2019-07-13 ENCOUNTER — Ambulatory Visit: Payer: Medicare Other

## 2019-07-20 ENCOUNTER — Ambulatory Visit (INDEPENDENT_AMBULATORY_CARE_PROVIDER_SITE_OTHER): Payer: Medicare Other | Admitting: Family Medicine

## 2019-07-20 ENCOUNTER — Other Ambulatory Visit: Payer: Self-pay

## 2019-07-20 ENCOUNTER — Encounter: Payer: Self-pay | Admitting: Family Medicine

## 2019-07-20 VITALS — BP 136/56 | HR 88 | Temp 96.5°F | Ht 69.0 in | Wt 205.2 lb

## 2019-07-20 DIAGNOSIS — T8454XD Infection and inflammatory reaction due to internal left knee prosthesis, subsequent encounter: Secondary | ICD-10-CM

## 2019-07-20 DIAGNOSIS — E785 Hyperlipidemia, unspecified: Secondary | ICD-10-CM

## 2019-07-20 DIAGNOSIS — I10 Essential (primary) hypertension: Secondary | ICD-10-CM | POA: Diagnosis not present

## 2019-07-20 DIAGNOSIS — I2510 Atherosclerotic heart disease of native coronary artery without angina pectoris: Secondary | ICD-10-CM | POA: Diagnosis not present

## 2019-07-20 DIAGNOSIS — E1122 Type 2 diabetes mellitus with diabetic chronic kidney disease: Secondary | ICD-10-CM

## 2019-07-20 DIAGNOSIS — N183 Chronic kidney disease, stage 3 unspecified: Secondary | ICD-10-CM

## 2019-07-20 DIAGNOSIS — Z0000 Encounter for general adult medical examination without abnormal findings: Secondary | ICD-10-CM

## 2019-07-20 DIAGNOSIS — Z7189 Other specified counseling: Secondary | ICD-10-CM

## 2019-07-20 MED ORDER — INSULIN GLARGINE 100 UNITS/ML SOLOSTAR PEN: PEN_INJECTOR | SUBCUTANEOUS | 3 refills | Status: DC

## 2019-07-20 NOTE — Patient Instructions (Addendum)
Thank you for your effort.  Check with your insurance to see if they will cover the shingles shot. It may be cheaper at the pharmacy.  If you have low sugars then cut back on your insulin by 5 units and call me.  Recheck in 3 months.  The only lab you need to have done for your next diabetic visit is an A1c.  We can do this with a fingerstick test at the office visit.  You do not need a lab visit ahead of time for this.  It does not matter if you are fasting when the lab is done.   Take care.  Glad to see you.

## 2019-07-20 NOTE — Progress Notes (Signed)
This visit occurred during the SARS-CoV-2 public health emergency.  Safety protocols were in place, including screening questions prior to the visit, additional usage of staff PPE, and extensive cleaning of exam room while observing appropriate contact time as indicated for disinfecting solutions.  His rib pain resolved. Cautions d/w pt.  We talked about options, he didn't think he needed a cane yet.  He declined PT referral at this point.    Recheck memory.  Oriented to year, month, day.  3/3 attention.   Can do math.   Can read a watch.  3/3 recall Normal testing today. He didn't know of memory deficits, no red flag events.    Still on amoxil at baseline, no ADE on med.  Had ID f/u.  No fevers.  No knee pain.  No fevers.    CKD per renal with recent f/u noted.    Still on iron MWF. No ADE on med.  Compliant.    Diabetes:  Using medications without difficulties: yes Hypoglycemic episodes:no Hyperglycemic episodes:no Feet problems:no Blood Sugars averaging: 125-150 eye exam within last year: yes His sugar was clearly higher prev but he cut back on total daily calorie intake and his sugar has improved in the last month.   A1c improved recently.  Elevated Cholesterol: Using medications without problems: yes Muscle aches: no Diet compliance: encouraged.   Exercise: encouraged Labs d/w pt.    CAD/HTN.  Using medication without problems or lightheadedness: yes Chest pain with exertion:no Edema:some occ intermittent L leg edema Short of breath: no acute changes, some of his dec in exercise capacity would expected given his age.    Wife designated if patient were incapacitated.   Vaccines d/w pt.   covid vaccine done x2 in 2021.  2nd shot 07/05/19, 1st shot 4 weeks earlier, likely moderna.  shingrix d/w pt.   Defer colon and prostate cancer screening at this point, patient agrees.    PMH and SH reviewed  Meds, vitals, and allergies reviewed.   ROS: Per HPI unless  specifically indicated in ROS section   GEN: nad, alert and oriented HEENT: ncat NECK: supple w/o LA CV: rrr. PULM: ctab, no inc wob ABD: soft, +bs EXT: no edema SKIN: no acute rash  Diabetic foot exam: Normal inspection No skin breakdown No calluses  Mild decrease in DP pulses B compared radial pulses but clearly palpable. Normal sensation to light touch and monofilament Nails normal

## 2019-07-23 NOTE — Assessment & Plan Note (Addendum)
Wife designated if patient were incapacitated.   Vaccines d/w pt.   covid vaccine done x2 in 2021.  2nd shot 07/05/19, 1st shot 4 weeks earlier, likely moderna.  shingrix d/w pt.   Defer colon and prostate cancer screening at this point, patient agrees.    Recheck memory.  Oriented to year, month, day.  3/3 attention.   Can do math.   Can read a watch.  3/3 recall Normal testing today. He didn't know of memory deficits, no red flag events.    His rib pain resolved. Cautions d/w pt.  We talked about options, he didn't think he needed a cane yet.  He declined PT referral at this point.

## 2019-07-23 NOTE — Assessment & Plan Note (Signed)
Still on amoxil at baseline, no ADE on med.  Had ID f/u.  No fevers.  No knee pain.  No fevers.   Continue as is.  He agrees.

## 2019-07-23 NOTE — Assessment & Plan Note (Signed)
His sugar was clearly higher prev but he cut back on total daily calorie intake and his sugar has improved in the last month.   A1c improved recently.  If low sugars then cut back on your insulin by 5 units and call me.  Recheck in 3 months.  He agrees.

## 2019-07-23 NOTE — Assessment & Plan Note (Signed)
Per renal.  I will defer.  He agrees.

## 2019-07-23 NOTE — Assessment & Plan Note (Signed)
No change in meds at this point.  He will update me as needed.  He agrees.

## 2019-07-23 NOTE — Assessment & Plan Note (Signed)
Wife designated if patient were incapacitated.  

## 2019-08-01 ENCOUNTER — Ambulatory Visit (INDEPENDENT_AMBULATORY_CARE_PROVIDER_SITE_OTHER): Payer: Medicare Other | Admitting: *Deleted

## 2019-08-01 DIAGNOSIS — I442 Atrioventricular block, complete: Secondary | ICD-10-CM

## 2019-08-01 LAB — CUP PACEART REMOTE DEVICE CHECK
Battery Remaining Longevity: 95 mo
Battery Remaining Percentage: 95.5 %
Battery Voltage: 2.99 V
Brady Statistic AP VP Percent: 8.2 %
Brady Statistic AP VS Percent: 1 %
Brady Statistic AS VP Percent: 92 %
Brady Statistic AS VS Percent: 1 %
Brady Statistic RA Percent Paced: 8 %
Brady Statistic RV Percent Paced: 99 %
Date Time Interrogation Session: 20210309020013
Implantable Lead Implant Date: 20160321
Implantable Lead Implant Date: 20160321
Implantable Lead Location: 753859
Implantable Lead Location: 753860
Implantable Pulse Generator Implant Date: 20160321
Lead Channel Impedance Value: 450 Ohm
Lead Channel Impedance Value: 550 Ohm
Lead Channel Pacing Threshold Amplitude: 0.75 V
Lead Channel Pacing Threshold Amplitude: 1.75 V
Lead Channel Pacing Threshold Pulse Width: 0.5 ms
Lead Channel Pacing Threshold Pulse Width: 0.8 ms
Lead Channel Sensing Intrinsic Amplitude: 3 mV
Lead Channel Sensing Intrinsic Amplitude: 9.5 mV
Lead Channel Setting Pacing Amplitude: 2.5 V
Lead Channel Setting Pacing Amplitude: 3 V
Lead Channel Setting Pacing Pulse Width: 0.5 ms
Lead Channel Setting Sensing Sensitivity: 8 mV
Pulse Gen Model: 2240
Pulse Gen Serial Number: 7734710

## 2019-08-02 NOTE — Progress Notes (Signed)
PPM Remote  

## 2019-08-08 ENCOUNTER — Other Ambulatory Visit: Payer: Self-pay | Admitting: Cardiology

## 2019-08-29 ENCOUNTER — Other Ambulatory Visit: Payer: Self-pay | Admitting: Family Medicine

## 2019-10-16 ENCOUNTER — Other Ambulatory Visit: Payer: Self-pay | Admitting: *Deleted

## 2019-10-16 MED ORDER — INSULIN GLARGINE 100 UNITS/ML SOLOSTAR PEN: PEN_INJECTOR | SUBCUTANEOUS | 3 refills | Status: DC

## 2019-10-16 NOTE — Telephone Encounter (Signed)
Rx faxed to [facility] [phone].

## 2019-10-17 ENCOUNTER — Ambulatory Visit: Payer: Medicare Other | Admitting: Family Medicine

## 2019-10-18 ENCOUNTER — Telehealth: Payer: Self-pay | Admitting: Family Medicine

## 2019-10-18 NOTE — Telephone Encounter (Signed)
Please check on patient.  He had an appointment on Tuesday but he did not come in.  Please see how he is doing and please make sure he did not get a no-show fee.  Thanks.

## 2019-10-19 NOTE — Telephone Encounter (Signed)
ATC, voicemail not set up Will call back   Will route message to Raquel Sarna to follow up on No Show fee.

## 2019-10-19 NOTE — Telephone Encounter (Signed)
Thanks.  Please let me know if you hear anything from the patient.

## 2019-10-19 NOTE — Telephone Encounter (Signed)
I was still able to cancel, so I cancelled the appointment so he doesn't get charged with a no-show fee.

## 2019-10-27 ENCOUNTER — Encounter: Payer: Self-pay | Admitting: Family Medicine

## 2019-10-27 ENCOUNTER — Ambulatory Visit: Payer: Medicare Other | Admitting: Family Medicine

## 2019-10-27 ENCOUNTER — Other Ambulatory Visit: Payer: Self-pay

## 2019-10-27 VITALS — BP 142/56 | HR 66 | Temp 96.9°F | Ht 69.0 in | Wt 201.2 lb

## 2019-10-27 DIAGNOSIS — E1122 Type 2 diabetes mellitus with diabetic chronic kidney disease: Secondary | ICD-10-CM

## 2019-10-27 DIAGNOSIS — I442 Atrioventricular block, complete: Secondary | ICD-10-CM | POA: Diagnosis not present

## 2019-10-27 DIAGNOSIS — T8454XD Infection and inflammatory reaction due to internal left knee prosthesis, subsequent encounter: Secondary | ICD-10-CM | POA: Diagnosis not present

## 2019-10-27 DIAGNOSIS — E119 Type 2 diabetes mellitus without complications: Secondary | ICD-10-CM | POA: Diagnosis not present

## 2019-10-27 LAB — POCT GLYCOSYLATED HEMOGLOBIN (HGB A1C): Hemoglobin A1C: 8.5 % — AB (ref 4.0–5.6)

## 2019-10-27 NOTE — Progress Notes (Signed)
This visit occurred during the SARS-CoV-2 public health emergency.  Safety protocols were in place, including screening questions prior to the visit, additional usage of staff PPE, and extensive cleaning of exam room while observing appropriate contact time as indicated for disinfecting solutions.  Diabetes:  Using medications without difficulties: yes Hypoglycemic episodes:no Hyperglycemic episodes:no Feet problems: no Blood Sugars averaging: usually 100s, often 100-160s.   eye exam within last year: yes A1c d/w pt.  Improved.  Goal A1c in the 8s, d/w pt, given his age.   30 units of insulin, still on glipizide and januvia.   He has been working on diet.  D/w pt.    He had his covid vaccine.   H/o complete heart block with pacer.  Still on eliquis at baseline.  He is occ lightheaded, if he gets up too fast.  Sx resolve quickly, not happening with every time standing.  Cautions d/w pt.  No CP.  Not SOB.  occ trace L leg edema.  BP lower on home check compared to SBP here today.    Still on abx at baseline for his knee.  Doing well w/o fevers.  No knee pain but L knee if stiffer than R knee.    Meds, vitals, and allergies reviewed.  ROS: Per HPI unless specifically indicated in ROS section   GEN: nad, alert and oriented HEENT: ncat NECK: supple w/o LA CV: rrr. PULM: ctab, no inc wob ABD: soft, +bs EXT: no edema SKIN: no acute rash  See after visit summary.

## 2019-10-27 NOTE — Patient Instructions (Addendum)
If you have more trouble with getting lightheaded, let us know.  We may need to change your BP medicine.  If feeling okay, continue as is for now.   If you have low sugars then cut back on your insulin by 5 units and call me.  Recheck in 3 months.  The only lab you need to have done for your next diabetic visit is an A1c.  We can do this with a fingerstick test at the office visit.  You do not need a lab visit ahead of time for this.  It does not matter if you are fasting when the lab is done.    Take care.  Glad to see you.

## 2019-10-29 NOTE — Assessment & Plan Note (Signed)
Continue amoxicillin as is.  He will update me as needed.

## 2019-10-29 NOTE — Assessment & Plan Note (Signed)
A1c d/w pt.  Improved.  Goal A1c in the 8s, d/w pt, given his age.   30 units of insulin, still on glipizide and januvia.   He has been working on diet.  D/w pt.   Continue as is.  He agrees.

## 2019-10-29 NOTE — Assessment & Plan Note (Signed)
H/o complete heart block with pacer.  Still on eliquis at baseline.  He is occ lightheaded, if he gets up too fast.  Sx resolve quickly, not happening with every time standing.  Cautions d/w pt.  No CP.  Not SOB.  occ trace L leg edema.  BP lower on home check compared to SBP here today.   Continue as is.  He will update me as needed.

## 2019-10-30 ENCOUNTER — Other Ambulatory Visit: Payer: Self-pay | Admitting: Cardiology

## 2019-10-30 DIAGNOSIS — E785 Hyperlipidemia, unspecified: Secondary | ICD-10-CM

## 2019-10-30 DIAGNOSIS — I10 Essential (primary) hypertension: Secondary | ICD-10-CM

## 2019-10-31 ENCOUNTER — Ambulatory Visit (INDEPENDENT_AMBULATORY_CARE_PROVIDER_SITE_OTHER): Payer: Medicare Other | Admitting: *Deleted

## 2019-10-31 DIAGNOSIS — I442 Atrioventricular block, complete: Secondary | ICD-10-CM | POA: Diagnosis not present

## 2019-10-31 LAB — CUP PACEART REMOTE DEVICE CHECK
Battery Remaining Longevity: 92 mo
Battery Remaining Percentage: 95.5 %
Battery Voltage: 2.99 V
Brady Statistic AP VP Percent: 8.4 %
Brady Statistic AP VS Percent: 1 %
Brady Statistic AS VP Percent: 92 %
Brady Statistic AS VS Percent: 1 %
Brady Statistic RA Percent Paced: 8.2 %
Brady Statistic RV Percent Paced: 99 %
Date Time Interrogation Session: 20210608020012
Implantable Lead Implant Date: 20160321
Implantable Lead Implant Date: 20160321
Implantable Lead Location: 753859
Implantable Lead Location: 753860
Implantable Pulse Generator Implant Date: 20160321
Lead Channel Impedance Value: 440 Ohm
Lead Channel Impedance Value: 490 Ohm
Lead Channel Pacing Threshold Amplitude: 0.75 V
Lead Channel Pacing Threshold Amplitude: 1.75 V
Lead Channel Pacing Threshold Pulse Width: 0.5 ms
Lead Channel Pacing Threshold Pulse Width: 0.8 ms
Lead Channel Sensing Intrinsic Amplitude: 2.2 mV
Lead Channel Sensing Intrinsic Amplitude: 9.5 mV
Lead Channel Setting Pacing Amplitude: 2.5 V
Lead Channel Setting Pacing Amplitude: 3 V
Lead Channel Setting Pacing Pulse Width: 0.5 ms
Lead Channel Setting Sensing Sensitivity: 8 mV
Pulse Gen Model: 2240
Pulse Gen Serial Number: 7734710

## 2019-10-31 NOTE — Telephone Encounter (Signed)
Hyperlipidemia on crestor managed by pcp

## 2019-11-01 NOTE — Progress Notes (Signed)
Remote pacemaker transmission.   

## 2019-11-02 ENCOUNTER — Other Ambulatory Visit: Payer: Self-pay | Admitting: Family Medicine

## 2019-11-09 ENCOUNTER — Other Ambulatory Visit: Payer: Self-pay | Admitting: Cardiology

## 2019-11-09 DIAGNOSIS — E785 Hyperlipidemia, unspecified: Secondary | ICD-10-CM

## 2019-11-09 DIAGNOSIS — I10 Essential (primary) hypertension: Secondary | ICD-10-CM

## 2019-12-29 ENCOUNTER — Other Ambulatory Visit: Payer: Self-pay | Admitting: Cardiology

## 2019-12-29 DIAGNOSIS — I480 Paroxysmal atrial fibrillation: Secondary | ICD-10-CM

## 2019-12-29 NOTE — Telephone Encounter (Signed)
Pt last saw Ermalinda Barrios, PA on 06/27/19, last labs 12/25/19 Creat 2.43 per care everywhere at [facility] nephrology, age 86, weight 91.3kg, based on specified criteria pt is on appropriate dosage of Eliquis 2.5mg  BID.  Will refill rx.

## 2020-01-01 ENCOUNTER — Other Ambulatory Visit: Payer: Self-pay | Admitting: Family Medicine

## 2020-01-30 ENCOUNTER — Ambulatory Visit (INDEPENDENT_AMBULATORY_CARE_PROVIDER_SITE_OTHER): Payer: Medicare Other | Admitting: *Deleted

## 2020-01-30 ENCOUNTER — Ambulatory Visit: Payer: Medicare Other | Admitting: Family Medicine

## 2020-01-30 DIAGNOSIS — I442 Atrioventricular block, complete: Secondary | ICD-10-CM | POA: Diagnosis not present

## 2020-01-30 DIAGNOSIS — Z0289 Encounter for other administrative examinations: Secondary | ICD-10-CM

## 2020-01-30 LAB — CUP PACEART REMOTE DEVICE CHECK
Battery Remaining Longevity: 91 mo
Battery Remaining Percentage: 95.5 %
Battery Voltage: 2.99 V
Brady Statistic AP VP Percent: 8 %
Brady Statistic AP VS Percent: 1 %
Brady Statistic AS VP Percent: 92 %
Brady Statistic AS VS Percent: 1 %
Brady Statistic RA Percent Paced: 7.9 %
Brady Statistic RV Percent Paced: 99 %
Date Time Interrogation Session: 20210907020014
Implantable Lead Implant Date: 20160321
Implantable Lead Implant Date: 20160321
Implantable Lead Location: 753859
Implantable Lead Location: 753860
Implantable Pulse Generator Implant Date: 20160321
Lead Channel Impedance Value: 430 Ohm
Lead Channel Impedance Value: 440 Ohm
Lead Channel Pacing Threshold Amplitude: 0.75 V
Lead Channel Pacing Threshold Amplitude: 1.75 V
Lead Channel Pacing Threshold Pulse Width: 0.5 ms
Lead Channel Pacing Threshold Pulse Width: 0.8 ms
Lead Channel Sensing Intrinsic Amplitude: 1.8 mV
Lead Channel Sensing Intrinsic Amplitude: 9.5 mV
Lead Channel Setting Pacing Amplitude: 2.5 V
Lead Channel Setting Pacing Amplitude: 3 V
Lead Channel Setting Pacing Pulse Width: 0.5 ms
Lead Channel Setting Sensing Sensitivity: 8 mV
Pulse Gen Model: 2240
Pulse Gen Serial Number: 7734710

## 2020-01-31 NOTE — Progress Notes (Signed)
Remote pacemaker transmission.   

## 2020-02-02 ENCOUNTER — Other Ambulatory Visit: Payer: Self-pay | Admitting: Cardiology

## 2020-02-17 ENCOUNTER — Other Ambulatory Visit: Payer: Self-pay | Admitting: Family Medicine

## 2020-04-12 ENCOUNTER — Ambulatory Visit: Payer: Medicare Other | Admitting: Family Medicine

## 2020-04-12 ENCOUNTER — Other Ambulatory Visit: Payer: Self-pay

## 2020-04-12 ENCOUNTER — Encounter: Payer: Self-pay | Admitting: Family Medicine

## 2020-04-12 VITALS — BP 140/60 | HR 72 | Temp 96.9°F | Wt 204.0 lb

## 2020-04-12 DIAGNOSIS — M109 Gout, unspecified: Secondary | ICD-10-CM

## 2020-04-12 DIAGNOSIS — E1122 Type 2 diabetes mellitus with diabetic chronic kidney disease: Secondary | ICD-10-CM

## 2020-04-12 DIAGNOSIS — D508 Other iron deficiency anemias: Secondary | ICD-10-CM | POA: Diagnosis not present

## 2020-04-12 DIAGNOSIS — W19XXXA Unspecified fall, initial encounter: Secondary | ICD-10-CM | POA: Diagnosis not present

## 2020-04-12 DIAGNOSIS — Y92009 Unspecified place in unspecified non-institutional (private) residence as the place of occurrence of the external cause: Secondary | ICD-10-CM

## 2020-04-12 MED ORDER — FERROUS SULFATE 325 (65 FE) MG PO TABS: ORAL_TABLET | ORAL | Status: DC

## 2020-04-12 NOTE — Patient Instructions (Addendum)
Go to the lab on the way out.   If you have mychart we'll likely use that to update you.    Don't change your meds for now.  Glad to see you.  Take care.  Glad to see you. Try the balance exercise at home and let me know if you want to go to PT.   Let me see about when to get together again when I see your labs.

## 2020-04-12 NOTE — Progress Notes (Signed)
This visit occurred during the SARS-CoV-2 public health emergency.  Safety protocols were in place, including screening questions prior to the visit, additional usage of staff PPE, and extensive cleaning of exam room while observing appropriate contact time as indicated for disinfecting solutions.  Diabetes:  Using medications without difficulties: yes, glipizide, insulin and januvia.   Hypoglycemic episodes:no Hyperglycemic episodes:no Feet problems: no Blood Sugars averaging: ~150, higher after eating pasta Labs pending.    Still on amoxil at baseline.  No fevers.  He has routine infectious disease clinic follow-up.  His Covid follow-up booster is pending for 04/26/2020.  His balance is slightly less good than prev.  He isn't lightheaded.  Fell last night.  Was outside, in the dark, stepped on a stick.  No injury other than small scrape on L elbow.  Covered with bandaid.  Local bruising.  No LOC.  He has a cane, used episodically.  It helps.  Discussed fall cautions.  He came up for a visit yesterday but apparently he was not on the schedule.  Apparently his visit was canceled but he did not know about this.  I profusely apologized and I told him I will check on this to try to keep that from ever happening again.  He was really gracious about it and I thanked him.  Meds, vitals, and allergies reviewed.  ROS: Per HPI unless specifically indicated in ROS section   GEN: nad, alert and oriented HEENT: ncat NECK: supple w/o LA CV: rrr. PULM: ctab, no inc wob ABD: soft, +bs EXT: no edema SKIN: well perfused.  Superficial bruising on the left elbow with small abrasion covered with Band-Aid.  No spreading erythema or bleeding. Gait symmetric.  No foot drop.

## 2020-04-13 LAB — CBC WITH DIFFERENTIAL/PLATELET
Absolute Monocytes: 570 cells/uL (ref 200–950)
Basophils Absolute: 81 cells/uL (ref 0–200)
Basophils Relative: 1.1 %
Eosinophils Absolute: 170 cells/uL (ref 15–500)
Eosinophils Relative: 2.3 %
HCT: 38.3 % — ABNORMAL LOW (ref 38.5–50.0)
Hemoglobin: 12.9 g/dL — ABNORMAL LOW (ref 13.2–17.1)
Lymphs Abs: 1791 cells/uL (ref 850–3900)
MCH: 31.1 pg (ref 27.0–33.0)
MCHC: 33.7 g/dL (ref 32.0–36.0)
MCV: 92.3 fL (ref 80.0–100.0)
MPV: 11 fL (ref 7.5–12.5)
Monocytes Relative: 7.7 %
Neutro Abs: 4788 cells/uL (ref 1500–7800)
Neutrophils Relative %: 64.7 %
Platelets: 145 10*3/uL (ref 140–400)
RBC: 4.15 10*6/uL — ABNORMAL LOW (ref 4.20–5.80)
RDW: 12.1 % (ref 11.0–15.0)
Total Lymphocyte: 24.2 %
WBC: 7.4 10*3/uL (ref 3.8–10.8)

## 2020-04-13 LAB — HEMOGLOBIN A1C
Hgb A1c MFr Bld: 9 % of total Hgb — ABNORMAL HIGH (ref <5.7)
Mean Plasma Glucose: 212 (calc)
eAG (mmol/L): 11.7 (calc)

## 2020-04-13 LAB — COMPREHENSIVE METABOLIC PANEL
AG Ratio: 2 (calc) (ref 1.0–2.5)
ALT: 26 U/L (ref 9–46)
AST: 19 U/L (ref 10–35)
Albumin: 4.3 g/dL (ref 3.6–5.1)
Alkaline phosphatase (APISO): 142 U/L (ref 35–144)
BUN/Creatinine Ratio: 15 (calc) (ref 6–22)
BUN: 36 mg/dL — ABNORMAL HIGH (ref 7–25)
CO2: 26 mmol/L (ref 20–32)
Calcium: 9.9 mg/dL (ref 8.6–10.3)
Chloride: 108 mmol/L (ref 98–110)
Creat: 2.42 mg/dL — ABNORMAL HIGH (ref 0.70–1.11)
Globulin: 2.2 g/dL (calc) (ref 1.9–3.7)
Glucose, Bld: 310 mg/dL — ABNORMAL HIGH (ref 65–99)
Potassium: 5.3 mmol/L (ref 3.5–5.3)
Sodium: 143 mmol/L (ref 135–146)
Total Bilirubin: 0.5 mg/dL (ref 0.2–1.2)
Total Protein: 6.5 g/dL (ref 6.1–8.1)

## 2020-04-13 LAB — URIC ACID: Uric Acid, Serum: 7.8 mg/dL (ref 4.0–8.0)

## 2020-04-13 LAB — LIPID PANEL
Cholesterol: 143 mg/dL (ref <200)
HDL: 49 mg/dL (ref >=40)
LDL Cholesterol (Calc): 71 mg/dL (calc)
Non-HDL Cholesterol (Calc): 94 mg/dL (calc) (ref <130)
Total CHOL/HDL Ratio: 2.9 (calc) (ref <5.0)
Triglycerides: 142 mg/dL (ref <150)

## 2020-04-13 LAB — IRON: Iron: 174 ug/dL (ref 50–180)

## 2020-04-13 LAB — TSH: TSH: 2.22 mIU/L (ref 0.40–4.50)

## 2020-04-14 ENCOUNTER — Other Ambulatory Visit: Payer: Self-pay | Admitting: Family Medicine

## 2020-04-14 DIAGNOSIS — W19XXXA Unspecified fall, initial encounter: Secondary | ICD-10-CM | POA: Insufficient documentation

## 2020-04-14 DIAGNOSIS — Y92009 Unspecified place in unspecified non-institutional (private) residence as the place of occurrence of the external cause: Secondary | ICD-10-CM | POA: Insufficient documentation

## 2020-04-14 DIAGNOSIS — N183 Chronic kidney disease, stage 3 unspecified: Secondary | ICD-10-CM

## 2020-04-14 NOTE — Assessment & Plan Note (Signed)
We talked about fall cautions, using a cane, being careful, and home balance exercises.  We can set him up with PT in the future if he prefers this but he wanted to defer for now.  He can update me as needed.  The scrape on his elbow should heal.

## 2020-04-14 NOTE — Assessment & Plan Note (Signed)
No change in meds at this point.  We agreed to check his labs and then set follow-up once I see his labs.  Continue glipizide insulin and Januvia as is for now.  He agrees.  Okay for outpatient follow-up.

## 2020-04-17 ENCOUNTER — Other Ambulatory Visit (INDEPENDENT_AMBULATORY_CARE_PROVIDER_SITE_OTHER): Payer: Medicare Other

## 2020-04-17 ENCOUNTER — Other Ambulatory Visit: Payer: Self-pay

## 2020-04-17 DIAGNOSIS — N183 Chronic kidney disease, stage 3 unspecified: Secondary | ICD-10-CM

## 2020-04-17 LAB — BASIC METABOLIC PANEL
BUN: 33 mg/dL — ABNORMAL HIGH (ref 6–23)
CO2: 28 mEq/L (ref 19–32)
Calcium: 9.3 mg/dL (ref 8.4–10.5)
Chloride: 105 mEq/L (ref 96–112)
Creatinine, Ser: 2.48 mg/dL — ABNORMAL HIGH (ref 0.40–1.50)
GFR: 22.2 mL/min — ABNORMAL LOW (ref >60.00)
Glucose, Bld: 191 mg/dL — ABNORMAL HIGH (ref 70–99)
Potassium: 4.8 mEq/L (ref 3.5–5.1)
Sodium: 141 mEq/L (ref 135–145)

## 2020-04-21 ENCOUNTER — Other Ambulatory Visit: Payer: Self-pay | Admitting: Family Medicine

## 2020-04-22 ENCOUNTER — Telehealth: Payer: Self-pay | Admitting: Family Medicine

## 2020-04-22 DIAGNOSIS — N183 Chronic kidney disease, stage 3 unspecified: Secondary | ICD-10-CM

## 2020-04-22 NOTE — Telephone Encounter (Signed)
Called and spoke with Debe Coder, nurse at Dr. Keturah Barre office to follow up per Dr. Damita Dunnings. She stated that Dr. Candiss Norse is seeing patients at the hospital this week, but that she would relay the message to him and return the call based on Dr. Keturah Barre recommendations.

## 2020-04-22 NOTE — Telephone Encounter (Signed)
See below.  I also need input from the renal clinic.   Please update patient. His repeat kidney function is similar to his value from 04/12/2020. This is slightly worse than his typical baseline. I would avoid ibuprofen and Aleve in the meantime.  I need input from Dr. Murlean Iba with the renal clinic. It may be that we need to decrease his losartan, Crestor, and Januvia, but I want input from Dr. Candiss Norse first (ie medication adjustment and follow-up). Please contact Dr. Keturah Barre office and update me with his advice. Thanks.

## 2020-04-22 NOTE — Telephone Encounter (Signed)
Spoke with pt's wife Mardene Celeste and I gave her your recommendations and told her that we would F/U with her after you spoke with his renal dr. She also stated that the pt has been taking Aleeve for chest pain.  I spoke with Anisha and told her to F/U with the renal Dr regarding you message.  Thank you

## 2020-04-22 NOTE — Telephone Encounter (Signed)
Patient is returning call about his lab results. EM

## 2020-04-23 ENCOUNTER — Telehealth: Payer: Self-pay | Admitting: Family Medicine

## 2020-04-23 NOTE — Telephone Encounter (Signed)
See documentation on other phone note.

## 2020-04-23 NOTE — Telephone Encounter (Signed)
Please call Dean Murphy back in regards to this patient. She spoke with the dr there at their facility and has info on what to do. EM

## 2020-04-23 NOTE — Telephone Encounter (Signed)
Spoke with Debe Coder, Dr. Keturah Barre nurse at [facility]. Dr. Candiss Norse suggested to stop losartan. No other changes were recommended.

## 2020-04-24 ENCOUNTER — Other Ambulatory Visit: Payer: Self-pay | Admitting: Family Medicine

## 2020-04-24 NOTE — Telephone Encounter (Signed)
Pharmacy requests refill on: Lubiprostone 8 mcg   LAST REFILL: 11/02/2019 (Q-90, R-1) LAST OV: 04/12/2020 NEXT OV: 08/13/2020 PHARMACY: [facility] [place], [place]

## 2020-04-24 NOTE — Addendum Note (Signed)
Addended by: Tonia Ghent on: 04/24/2020 08:51 PM   Modules accepted: Orders

## 2020-04-24 NOTE — Telephone Encounter (Signed)
Please have him stop losartan and we can recheck a bmet here in about 2 weeks.  If his blood pressure is dramatically higher in the meantime then please let me know.  I put in the order for labs.  Thanks.

## 2020-04-25 NOTE — Telephone Encounter (Signed)
Spoke with pt about stopping Losartan and he understood

## 2020-04-26 ENCOUNTER — Ambulatory Visit: Payer: Medicare Other | Attending: Internal Medicine

## 2020-04-26 DIAGNOSIS — Z23 Encounter for immunization: Secondary | ICD-10-CM

## 2020-04-26 NOTE — Progress Notes (Signed)
   Covid-19 Vaccination Clinic  Name:  Dean Murphy    MRN: [id number] DOB: 05/13/1927  04/26/2020  [name] was observed post Covid-19 immunization for 15 minutes without incident. He was provided with Vaccine Information Sheet and instruction to access the V-Safe system.   [name] was instructed to call 911 with any severe reactions post vaccine: [name] Difficulty breathing  . Swelling of face and throat  . A fast heartbeat  . A bad rash all over body  . Dizziness and weakness   Immunizations Administered    No immunizations on file.

## 2020-04-30 ENCOUNTER — Ambulatory Visit (INDEPENDENT_AMBULATORY_CARE_PROVIDER_SITE_OTHER): Payer: Medicare Other

## 2020-04-30 DIAGNOSIS — I442 Atrioventricular block, complete: Secondary | ICD-10-CM | POA: Diagnosis not present

## 2020-04-30 LAB — CUP PACEART REMOTE DEVICE CHECK
Battery Remaining Longevity: 94 mo
Battery Remaining Percentage: 95.5 %
Battery Voltage: 2.98 V
Brady Statistic AP VP Percent: 8 %
Brady Statistic AP VS Percent: 1 %
Brady Statistic AS VP Percent: 92 %
Brady Statistic AS VS Percent: 1 %
Brady Statistic RA Percent Paced: 7.8 %
Brady Statistic RV Percent Paced: 99 %
Date Time Interrogation Session: 20211207020014
Implantable Lead Implant Date: 20160321
Implantable Lead Implant Date: 20160321
Implantable Lead Location: 753859
Implantable Lead Location: 753860
Implantable Pulse Generator Implant Date: 20160321
Lead Channel Impedance Value: 440 Ohm
Lead Channel Impedance Value: 550 Ohm
Lead Channel Pacing Threshold Amplitude: 0.75 V
Lead Channel Pacing Threshold Amplitude: 1.75 V
Lead Channel Pacing Threshold Pulse Width: 0.5 ms
Lead Channel Pacing Threshold Pulse Width: 0.8 ms
Lead Channel Sensing Intrinsic Amplitude: 11.8 mV
Lead Channel Sensing Intrinsic Amplitude: 2.6 mV
Lead Channel Setting Pacing Amplitude: 2.5 V
Lead Channel Setting Pacing Amplitude: 3 V
Lead Channel Setting Pacing Pulse Width: 0.5 ms
Lead Channel Setting Sensing Sensitivity: 8 mV
Pulse Gen Model: 2240
Pulse Gen Serial Number: 7734710

## 2020-05-02 ENCOUNTER — Encounter: Payer: Self-pay | Admitting: Family Medicine

## 2020-05-02 ENCOUNTER — Ambulatory Visit: Payer: Medicare Other | Admitting: Family Medicine

## 2020-05-02 ENCOUNTER — Other Ambulatory Visit: Payer: Self-pay

## 2020-05-02 DIAGNOSIS — H9202 Otalgia, left ear: Secondary | ICD-10-CM | POA: Diagnosis not present

## 2020-05-02 MED ORDER — CEPHALEXIN 500 MG PO CAPS: 500.0000 mg | ORAL_CAPSULE | Freq: Three times a day (TID) | ORAL | 0 refills | Status: DC

## 2020-05-02 NOTE — Progress Notes (Signed)
Subjective:    Patient ID: Dean Murphy, male    DOB: Dec 15, 1926, 86 y.o.   MRN: [id number]  This visit occurred during the SARS-CoV-2 public health emergency.  Safety protocols were in place, including screening questions prior to the visit, additional usage of staff PPE, and extensive cleaning of exam room while observing appropriate contact time as indicated for disinfecting solutions.    HPI 86 yo pt of Dr Damita Dunnings presents with L ear pain for 4 d  Wt Readings from Last 3 Encounters:  05/02/20 203 lb 2 oz (92.1 kg)  04/12/20 204 lb (92.5 kg)  10/27/19 201 lb 3 oz (91.3 kg)   30.00 kg/m  Former smoker with h/o CAD and a fib and DM   Had his covid booster shot Friday - in the L arm  Saturday felt pain in and around his ear  This persisted through the week  Put drops in it yesterday (oil type drops)  - for wax   Sore along pinna and behind his ear (perhaps a little swollen)  No rash (not imm for shingles)  Deep in ear feels like something is off  No drainage  No skin change   Hearing does not seem affected  No hearing aides  No ear bud or or ear plugs No loud noise exposure   Throat is fine  Nose and sinuses are clear   Arm is a little sore from the shot   TMJ is in chart from 2011  No cough no fever   Not dizzy (86 balance is worse with age baseline)   Patient Active Problem List   Diagnosis Date Noted  . Ear pain, left 05/02/2020  . Fall at home 04/14/2020  . Healthcare maintenance 07/21/2018  . Advance care planning 07/21/2018  . Constipation   . Low back pain 02/02/2018  . Bruise 12/19/2017  . Infection of prosthetic left knee joint ([place]) 07/29/2015  . Coronary artery disease 07/29/2015  . Stroke ([place]) 12/24/2014  . Double vision 12/23/2014  . Pacemaker 11/16/2014  . Anemia 09/27/2014  . Gastrointestinal hemorrhage associated with peptic ulcer 09/07/2014  . Atrial fibrillation-CHADS VASC2 = 5 (age, HTN, DM, Vasc) 08/13/2014  . Chest pain 08/11/2014  .  Complete heart block ([place]) 08/11/2014  . S/P CABG x 5 1989. Low risk Myoview 2012 08/11/2014  . Syncope 08/11/2014  . Cough 06/14/2014  . Left foot pain 02/23/2014  . Gouty arthropathy 08/09/2013  . Chronic radicular lumbar pain 12/15/2012  . PVD- s/p bilat CEA- CA dopplers OK Jan 2015 10/06/2012  . Insomnia 09/30/2011  . OTHER SPECIFIED SITES OF SPRAINS AND STRAINS 04/25/2010  . DIVERTICULITIS OF COLON 02/21/2010  . TEMPOROMANDIBULAR JOINT DISORDER 11/22/2009  . DM (diabetes mellitus), type 2 with renal complications ([place]) 32/67/1245  . Chronic kidney disease, stage III (moderate) ([place]) 07/26/2008  . CHRONIC PROSTATITIS 07/26/2008  . ADVEF, DRUG/MEDICINAL/BIOLOGICAL SUBST NOS 03/16/2007  . Dyslipidemia 11/15/2006  . Essential hypertension 11/15/2006  . GERD 11/15/2006  . Osteoarthritis 11/15/2006   Past Medical History:  Diagnosis Date  . Anemia   . Arthritis   . CAD (coronary artery disease)    a. CABG 1989, b. Myoview low risk 2012.  . Carotid artery occlusion    a. Duplex 10/2014: patent R/L CEA with mild hyperplasia in right surgical bulb - followed by vascular.  . CHB (complete heart block) [facility]) March 2016   a. s/p STJ dual chamber pacemaker 07/2014.  [name] Chronic diastolic CHF (congestive heart failure) ([place])  a. Dx 10/1605 - acute diastolic CHF in the setting of CHB.  [name] Chronic kidney disease, stage IV (severe) (HCC)    stage III/IV as of 2015, [name]  . Colon polyps   . Complication of anesthesia   . Constipation   . CVA (cerebral infarction)   . Diabetes mellitus    type II  . Diverticulosis   . Dupuytren's disease    finger right hand contracted  . Essential hypertension   . GERD (gastroesophageal reflux disease)   . Hemorrhoids   . Hyperlipidemia   . Myocardial infarction [facility]) 1976  and Mar. 19, 2016  . Orthostasis   . Osteoporosis   . PAF (paroxysmal atrial fibrillation) [facility]) March 2016  . PONV (postoperative nausea and vomiting)   .  Presence of permanent cardiac pacemaker   . Shortness of breath dyspnea    due to pain   Past Surgical History:  Procedure Laterality Date  . CARDIAC CATHETERIZATION  08/11/2014   Procedure: TEMPORARY PACEMAKER;  Surgeon: [name], MD;  Location[facility] C[facility] LAB;  Service: Cardiovascular;;  . CAROTID ENDARTERECTOMY  12/31/10   Right  . CAROTID ENDARTERECTOMY  02/09/11   left  . CHOLECYSTECTOMY    . CORONARY ARTERY BYPASS GRAFT  1989  . EYE SURGERY Bilateral    Cataract with implants  . I & D KNEE WITH POLY EXCHANGE Left 07/29/2015   Procedure: IRRIGATION AND DEBRIDEMENT LEFT KNEE WITH POLY EXCHANGE;  Surgeon: Rod Can, MD;  Location: [place];  Service: Orthopedics;  Laterality: Left;  . IR GENERIC HISTORICAL  03/04/2016   IR US GUIDE VASC ACCESS RIGHT 03/04/2016 Marybelle Killings, MD [facility]-INTER[facility]  . IR GENERIC HISTORICAL  03/04/2016   IR FLUORO GUIDE CV LINE RIGHT 03/04/2016 Marybelle Killings, MD [facility]  . JOINT REPLACEMENT     bilat. knees  . PERMANENT PACEMAKER INSERTION N/A 08/13/2014   STJ dual chamber pacemaker implanted by [name] for CHB  . TONSILLECTOMY    . TOTAL KNEE ARTHROPLASTY     bilateral   Social History   Tobacco Use  . Smoking status: Former Smoker    Packs/day: 1.00    Years: 25.00    Pack years: 25.00    Types: Cigarettes    Quit date: 01/21/1973    Years since quitting: 47.3  . Smokeless tobacco: Never Used  . Tobacco comment: began smoking in high school, quit age 86  Vaping Use  . Vaping Use: Never used  Substance Use Topics  . Alcohol use: Yes    Alcohol/week: 0.0 standard drinks    Comment: wine occ  . Drug use: No   Family History  Problem Relation Age of Onset  . Heart disease Mother        Before age 64  . Diabetes Mother   . Varicose Veins Mother   . Heart attack Mother   . Heart attack Father   . Heart disease Father        After age 52  . Hypertension Father   . Heart disease Brother        Before age 52  . Hypertension  Brother   . Heart attack Brother   . Diabetes Son   . Hypertension Son   . Hypertension Son   . Diabetes Son   . Diabetes Other   . Cancer Other   . Colon cancer Neg Hx    Allergies  Allergen Reactions  . Zoledronic Acid Other (See Comments)  Stopped 2015 due to Creatine  Other reaction(s): Unknown Stopped 2015 due to Cr  . Nsaids Other (See Comments)    Held due to creatinine elevation.     Current Outpatient Medications on File Prior to Visit  Medication Sig Dispense Refill  . amoxicillin (AMOXIL) 500 MG capsule Take 1 capsule (500 mg total) by mouth 2 (two) times daily. 180 capsule 3  . clotrimazole-betamethasone (LOTRISONE) cream APPLY TO AFFECTED AREA TWICE A DAY 30 g 1  . diltiazem (CARDIZEM CD) 360 MG 24 hr capsule TAKE 1 CAPSULE BY MOUTH EVERY DAY 90 capsule 1  . ELIQUIS 2.5 MG TABS tablet TAKE 1 TABLET BY MOUTH TWICE A DAY 180 tablet 1  . ferrous sulfate 325 (65 FE) MG tablet TAKE 1 TABLET BY MOUTH Monday and Friday    . glipiZIDE (GLUCOTROL) 5 MG tablet TAKE 2 TABLETS BY MOUTH EVERY MORNING BEFORE BREAKFAST AND 1 TABLET BEFORE SUPPER 270 tablet 3  . insulin glargine (LANTUS SOLOSTAR) 100 UNIT/ML Solostar Pen INJECT 30 UNITS INTO THE SKIN AT BEDTIME 15 mL 3  . Insulin Pen Needle (B-D UF III MINI PEN NEEDLES) 31G X 5 MM MISC USE DAILY WITH INSULIN PEN 90 each 4  . JANUVIA 50 MG tablet TAKE 1 TABLET BY MOUTH EVERY DAY 90 tablet 3  . lubiprostone (AMITIZA) 8 MCG capsule TAKE ONE CAPSULE BY MOUTH EACH MORNING 90 capsule 1  . omeprazole (PRILOSEC) 40 MG capsule TAKE 1 CAPSULE BY MOUTH EVERY DAY 90 capsule 1  . Ophthalmic Irrigation Solution (EYE WASH) 99.05 % SOLN Place 1 drop into both eyes 2 (two) times daily.    . rosuvastatin (CRESTOR) 20 MG tablet TAKE 1 TABLET BY MOUTH EVERY DAY 90 tablet 2  . torsemide (DEMADEX) 10 MG tablet Take 10 mg by mouth daily.     No current facility-administered medications on file prior to visit.    Review of Systems     Objective:    Physical Exam Constitutional:      Appearance: Normal appearance. He is obese. He is not ill-appearing.  HENT:     Head: Normocephalic and atraumatic.     Comments: No TMJ or temporal tenderness    Right Ear: Tympanic membrane and ear canal normal. No decreased hearing noted.     Left Ear: Tympanic membrane and ear canal normal. No decreased hearing noted. Tenderness present. No laceration, drainage or swelling.  No middle ear effusion. There is no impacted cerumen. No foreign body. There is mastoid tenderness. No hemotympanum. Tympanic membrane has normal mobility.     Ears:     Comments: Tender over L pinna and mastoid area     Nose: Nose normal.     Comments: No sinus tenderness    Mouth/Throat:     Mouth: Mucous membranes are moist.     Pharynx: No oropharyngeal exudate or posterior oropharyngeal erythema.  Eyes:     General:        Right eye: No discharge.        Left eye: No discharge.     Extraocular Movements: Extraocular movements intact.     Conjunctiva/sclera: Conjunctivae normal.     Pupils: Pupils are equal, round, and reactive to light.  Neck:     Vascular: No carotid bruit.     Comments: Mild L cervical tenderness (musculature) Nl rom  Cardiovascular:     Rate and Rhythm: Normal rate.  Pulmonary:     Effort: Pulmonary effort is normal. No respiratory distress.  Breath sounds: Normal breath sounds. No wheezing or rales.  Musculoskeletal:     Cervical back: Normal range of motion. No tenderness.  Lymphadenopathy:     Cervical: No cervical adenopathy.  Skin:    General: Skin is warm and dry.     Findings: No erythema or rash.  Neurological:     Mental Status: He is alert.     Cranial Nerves: No cranial nerve deficit.  Psychiatric:        Mood and Affect: Mood normal.           Assessment & Plan:   Problem List Items Addressed This Visit      Other   Ear pain, left    Tender over pinna and mastiod area (a little in L cervical musculature  No  swelling/redness or TM/canal abn on exam  No TMJ tenderness Hearing is roughly symmetric (HOH) No temple pain or vision change  No rash   Differential includes early skin infection, mastoiditis, or early shingles before rash  Will continue to watch closely for rash  Warm/cool compress Emp cover with keflex  Update if not starting to improve in a week or if worsening   inst to update [place] early next week regardless If severe will go to ER or urgent care

## 2020-05-02 NOTE — Patient Instructions (Signed)
Try a warm or cool compress on your ear -see what feels better  Take the keflex (in case of a skin bacterial infection)   Watch for a rash-shingles can start this way   Watch for swelling/redness or increased pain of skin and ear  Watch for increased neck pain   If you develop pain in the temple or any vision change please let us know   Update early next week with how you are doing

## 2020-05-02 NOTE — Assessment & Plan Note (Addendum)
Tender over pinna and mastiod area (a little in L cervical musculature  No swelling/redness or TM/canal abn on exam  No TMJ tenderness Hearing is roughly symmetric (HOH) No temple pain or vision change  No rash   Differential includes early skin infection, mastoiditis, or early shingles before rash  Will continue to watch closely for rash  Warm/cool compress Emp cover with keflex  Update if not starting to improve in a week or if worsening   inst to update [place] early next week regardless If severe will go to ER or urgent care

## 2020-05-10 NOTE — Progress Notes (Signed)
Remote pacemaker transmission.   

## 2020-05-13 LAB — HM DIABETES EYE EXAM

## 2020-06-12 ENCOUNTER — Other Ambulatory Visit: Payer: Self-pay | Admitting: Family Medicine

## 2020-06-12 NOTE — Telephone Encounter (Signed)
Pharmacy requests refill on: Januvia 50 mg   LAST REFILL: 06/14/2019 (Q-90, R-3) LAST OV: 05/02/2020 NEXT OV: 08/13/2020 PHARMACY: [id number] [place], [place]  Hgb A1C (04/12/2020): 9.0

## 2020-06-25 ENCOUNTER — Other Ambulatory Visit: Payer: Self-pay | Admitting: Internal Medicine

## 2020-06-25 DIAGNOSIS — I480 Paroxysmal atrial fibrillation: Secondary | ICD-10-CM

## 2020-06-25 NOTE — Telephone Encounter (Signed)
Eliquis 2.5mg  refill request received. Patient is 87 years old, weight-92.1kg, Crea-2.48 on 04/17/2020, Diagnosis-Afib and last seen by Ermalinda Barrios on 06/27/2019 and pending appt on 07/03/2020. Dose is appropriate based on dosing criteria. Will send in refill to requested pharmacy.

## 2020-07-01 NOTE — Progress Notes (Deleted)
Cardiology Office Note    Date:  07/01/2020   ID:  Dean Murphy, DOB [birth year], MRN [id number]  PCP:  Dean Ghent, MD  Cardiologist: Dean Dawley, MD EPS: Dean Peru, MD  No chief complaint on file.   History of Present Illness:  Dean Murphy is a 87 y.o. male with CAD s/p CABG 1989, CKD stage III-IV, HTN with history of orthostasis, DM, anemia, CHB 01/3817 (complicated by acute diastolic CHF) s/p PPM, CVA, carotid disease s/p CEAs (followed by [facility]), hyperlipidemia, PAF on Eliquis.echo 08/2017 showed EF 55-60%, HK of apical septal myocardium, grade 1 DD, mild MR, severe LAE, mildly increased PASP    I last saw the patient 06/27/2019 after which time he was recovering from a mechanical fall and broken ribs.  Was still driving.    Past Medical History:  Diagnosis Date  . Anemia   . Arthritis   . CAD (coronary artery disease)    a. CABG 1989, b. Myoview low risk 2012.  . Carotid artery occlusion    a. Duplex 10/2014: patent R/L CEA with mild hyperplasia in right surgical bulb - followed by vascular.  . CHB (complete heart block) [facility]) March 2016   a. s/p STJ dual chamber pacemaker 07/2014.  [name] Chronic diastolic CHF (congestive heart failure) ([place])    a. Dx 06/9935 - acute diastolic CHF in the setting of CHB.  [name] Chronic kidney disease, stage IV (severe) (HCC)    stage III/IV as of 2015, Dr Dean Murphy Nephologist  . Colon polyps   . Complication of anesthesia   . Constipation   . CVA (cerebral infarction)   . Diabetes mellitus    type II  . Diverticulosis   . Dupuytren's disease    finger right hand contracted  . Essential hypertension   . GERD (gastroesophageal reflux disease)   . Hemorrhoids   . Hyperlipidemia   . Myocardial infarction [facility]) 1976  and Mar. 19, 2016  . Orthostasis   . Osteoporosis   . PAF (paroxysmal atrial fibrillation) [facility]) March 2016  . PONV (postoperative nausea and vomiting)   . Presence of permanent cardiac pacemaker   . Shortness of breath  dyspnea    due to pain    Past Surgical History:  Procedure Laterality Date  . CARDIAC CATHETERIZATION  08/11/2014   Procedure: TEMPORARY PACEMAKER;  Surgeon: [name], MD;  Location: [facility] CATH LAB;  Service: Cardiovascular;;  . CAROTID ENDARTERECTOMY  12/31/10   Right  . CAROTID ENDARTERECTOMY  02/09/11   left  . CHOLECYSTECTOMY    . CORONARY ARTERY BYPASS GRAFT  1989  . EYE SURGERY Bilateral    Cataract with implants  . I & D KNEE WITH POLY EXCHANGE Left 07/29/2015   Procedure: IRRIGATION AND DEBRIDEMENT LEFT KNEE WITH POLY EXCHANGE;  Surgeon: Rod Can, MD;  Location: [place];  Service: Orthopedics;  Laterality: Left;  . IR GENERIC HISTORICAL  03/04/2016   IR US GUIDE VASC ACCESS RIGHT 03/04/2016 Marybelle Killings, MD [facility]INTER[facility]  . IR GENERIC HISTORICAL  03/04/2016   IR FLUORO GUIDE CV LINE RIGHT 03/04/2016 Marybelle Killings, MD [facility]  . JOINT REPLACEMENT     bilat. knees  . PERMANENT PACEMAKER INSERTION N/A 08/13/2014   STJ dual chamber pacemaker implanted by [name] for CHB  . TONSILLECTOMY    . TOTAL KNEE ARTHROPLASTY     bilateral    Current Medications: No outpatient medications have been marked as taking for the 07/03/20 encounter (Appointment)  with Imogene Burn, PA-C.     Allergies:   Zoledronic acid and Nsaids   Social History   Socioeconomic History  . Marital status: Married    Spouse name: Not on file  . Number of children: Not on file  . Years of education: Not on file  . Highest education level: Not on file  Occupational History  . Occupation: retired    Fish farm manager: RETIRED  Tobacco Use  . Smoking status: Former Smoker    Packs/day: 1.00    Years: 25.00    Pack years: 25.00    Types: Cigarettes    Quit date: 01/21/1973    Years since quitting: 47.4  . Smokeless tobacco: Never Used  . Tobacco comment: began smoking in high school, quit age 27  Vaping Use  . Vaping Use: Never used  Substance and Sexual Activity  . Alcohol use: Yes     Alcohol/week: 0.0 standard drinks    Comment: wine occ  . Drug use: No  . Sexual activity: Never  Other Topics Concern  . 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  Remarried 1998.     3 sons   Social Determinants of Health   Financial Resource Strain: Low Risk   . Difficulty of Paying Living Expenses: Not hard at all  Food Insecurity: No Food Insecurity  . Worried About Charity fundraiser in the Last Year: Never true  . Ran Out of Food in the Last Year: Never true  Transportation Needs: No Transportation Needs  . Lack of Transportation (Medical): No  . Lack of Transportation (Non-Medical): No  Physical Activity: Inactive  . Days of Exercise per Week: 0 days  . Minutes of Exercise per Session: 0 min  Stress: No Stress Concern Present  . Feeling of Stress : Not at all  Social Connections: Not on file     Family History:  The patient's ***family history includes Cancer in an other family member; Diabetes in his mother, son, son, and another family member; Heart attack in his brother, father, and mother; Heart disease in his brother, father, and mother; Hypertension in his brother, father, son, and son; Varicose Veins in his mother.   ROS:   Please see the history of present illness.    ROS All other systems reviewed and are negative.   PHYSICAL EXAM:   VS:  There were no vitals taken for this visit.  Physical Exam  GEN: Well nourished, well developed, in no acute distress  HEENT: normal  Neck: no JVD, carotid bruits, or masses Cardiac:RRR; no murmurs, rubs, or gallops  Respiratory:  clear to auscultation bilaterally, normal work of breathing GI: soft, nontender, nondistended, + BS Ext: without cyanosis, clubbing, or edema, Good distal pulses bilaterally MS: no deformity or atrophy  Skin: warm and dry, no rash Neuro:  Alert and Oriented x 3, Strength and sensation are intact Psych: euthymic mood,  full affect  Wt Readings from Last 3 Encounters:  05/02/20 203 lb 2 oz (92.1 kg)  04/12/20 204 lb (92.5 kg)  10/27/19 201 lb 3 oz (91.3 kg)      Studies/Labs Reviewed:   EKG:  EKG is*** ordered today.  The ekg ordered today demonstrates ***  Recent Labs: 04/12/2020: ALT 26; Hemoglobin 12.9; Platelets 145; TSH 2.22 04/17/2020: BUN 33; Creatinine, Ser 2.48; Potassium 4.8; Sodium 141   Lipid Panel  Component Value Date/Time   CHOL 143 04/12/2020 1705   TRIG 142 04/12/2020 1705   TRIG 62 04/28/2006 0917   HDL 49 04/12/2020 1705   CHOLHDL 2.9 04/12/2020 1705   VLDL 28.6 07/12/2019 0801   LDLCALC 71 04/12/2020 1705   LDLDIRECT 135.1 03/12/2014 0812    Additional studies/ records that were reviewed today include:   2D echo 08/2017 Study Conclusions   - Left ventricle: The cavity size was normal. There was moderate    concentric hypertrophy. Systolic function was normal. The    estimated ejection fraction was in the range of 55% to 60%.    Hypokinesis of the apical septal myocardium. Doppler parameters    are consistent with abnormal left ventricular relaxation (grade 1    diastolic dysfunction). Doppler parameters are consistent with    indetermiante ventricular filling pressure.  - Aortic valve: Transvalvular velocity was within the normal range.    There was no stenosis. There was no regurgitation.  - Mitral valve: Transvalvular velocity was within the normal range.    There was no evidence for stenosis. There was mild regurgitation.  - Left atrium: The atrium was severely dilated.  - Right ventricle: The cavity size was normal. Wall thickness was    normal. Systolic function was normal.  - Atrial septum: No defect or patent foramen ovale was identified    by color flow Doppler.  - Tricuspid valve: There was trivial regurgitation.  - Pulmonary arteries: Systolic pressure was mildly increased. PA    peak pressure: 41 mm Hg (S).        Risk Assessment/Calculations:    {Does this patient have ATRIAL FIBRILLATION?:[phone]}     ASSESSMENT:    No diagnosis found.   PLAN:  In order of problems listed above:  CAD status post CABG 1989  PAF on Eliquis and diltiazem  Complete heart block status post pacemaker followed by Dr. Lovena Le pacer check 06/01/19 stable  Essential hypertension with history of orthostasis  CKD stage III-IV  Hyperlipidemia  Chronic diastolic CHF last echo [id number] normal LVEF with grade 1 DD   Shared Decision Making/Informed Consent   {Are you ordering a CV Procedure (e.g. stress test, cath, DCCV, TEE, etc)?   Press F2        :[id number]    Medication Adjustments/Labs and Tests Ordered: Current medicines are reviewed at length with the patient today.  Concerns regarding medicines are outlined above.  Medication changes, Labs and Tests ordered today are listed in the Patient Instructions below. There are no Patient Instructions on file for this visit.   Signed, Ermalinda Barrios, PA-C  07/01/2020 2:10 PM    [facility] [place], [place], [place]  [id number] Phone: [phone]; Fax: [phone]

## 2020-07-03 ENCOUNTER — Ambulatory Visit: Payer: Medicare Other | Admitting: Physician Assistant

## 2020-07-03 DIAGNOSIS — I480 Paroxysmal atrial fibrillation: Secondary | ICD-10-CM

## 2020-07-03 DIAGNOSIS — I5032 Chronic diastolic (congestive) heart failure: Secondary | ICD-10-CM

## 2020-07-03 DIAGNOSIS — E785 Hyperlipidemia, unspecified: Secondary | ICD-10-CM

## 2020-07-03 DIAGNOSIS — I25810 Atherosclerosis of coronary artery bypass graft(s) without angina pectoris: Secondary | ICD-10-CM

## 2020-07-03 DIAGNOSIS — I442 Atrioventricular block, complete: Secondary | ICD-10-CM

## 2020-07-03 DIAGNOSIS — I10 Essential (primary) hypertension: Secondary | ICD-10-CM

## 2020-07-03 DIAGNOSIS — N183 Chronic kidney disease, stage 3 unspecified: Secondary | ICD-10-CM

## 2020-07-04 ENCOUNTER — Other Ambulatory Visit: Payer: Self-pay | Admitting: Internal Medicine

## 2020-07-04 DIAGNOSIS — T8454XD Infection and inflammatory reaction due to internal left knee prosthesis, subsequent encounter: Secondary | ICD-10-CM

## 2020-07-10 ENCOUNTER — Other Ambulatory Visit: Payer: Self-pay | Admitting: Family Medicine

## 2020-07-10 ENCOUNTER — Other Ambulatory Visit: Payer: Self-pay | Admitting: Cardiology

## 2020-07-10 DIAGNOSIS — E785 Hyperlipidemia, unspecified: Secondary | ICD-10-CM

## 2020-07-10 DIAGNOSIS — I10 Essential (primary) hypertension: Secondary | ICD-10-CM

## 2020-07-11 ENCOUNTER — Ambulatory Visit: Payer: Medicare Other | Admitting: Internal Medicine

## 2020-07-11 ENCOUNTER — Encounter: Payer: Self-pay | Admitting: Internal Medicine

## 2020-07-11 ENCOUNTER — Other Ambulatory Visit: Payer: Self-pay

## 2020-07-11 DIAGNOSIS — T8454XD Infection and inflammatory reaction due to internal left knee prosthesis, subsequent encounter: Secondary | ICD-10-CM | POA: Diagnosis not present

## 2020-07-11 DIAGNOSIS — L299 Pruritus, unspecified: Secondary | ICD-10-CM

## 2020-07-11 MED ORDER — AMOXICILLIN 500 MG PO CAPS: 500.0000 mg | ORAL_CAPSULE | Freq: Two times a day (BID) | ORAL | 3 refills | Status: DC

## 2020-07-11 NOTE — Assessment & Plan Note (Signed)
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  He chooses to remain on chronic suppressive amoxicillin for now.  I will check sed rate and C-reactive protein today.  He will follow-up in 1 year.

## 2020-07-11 NOTE — Progress Notes (Signed)
[facility]  Patient Active Problem List   Diagnosis Date Noted  . Infection of prosthetic left knee joint ([place]) 07/29/2015    Priority: High  . Ear pain, left 05/02/2020  . Fall at home 04/14/2020  . Healthcare maintenance 07/21/2018  . Advance care planning 07/21/2018  . Constipation   . Low back pain 02/02/2018  . Bruise 12/19/2017  . Coronary artery disease 07/29/2015  . Stroke ([place]) 12/24/2014  . Double vision 12/23/2014  . Pacemaker 11/16/2014  . Anemia 09/27/2014  . Gastrointestinal hemorrhage associated with peptic ulcer 09/07/2014  . Atrial fibrillation-CHADS VASC2 = 5 (age, HTN, DM, Vasc) 08/13/2014  . Chest pain 08/11/2014  . Complete heart block ([place]) 08/11/2014  . S/P CABG x 5 1989. Low risk Myoview 2012 08/11/2014  . Syncope 08/11/2014  . Cough 06/14/2014  . Left foot pain 02/23/2014  . Gouty arthropathy 08/09/2013  . Chronic radicular lumbar pain 12/15/2012  . PVD- s/p bilat CEA- CA dopplers OK Jan 2015 10/06/2012  . Insomnia 09/30/2011  . OTHER SPECIFIED SITES OF SPRAINS AND STRAINS 04/25/2010  . DIVERTICULITIS OF COLON 02/21/2010  . TEMPOROMANDIBULAR JOINT DISORDER 11/22/2009  . DM (diabetes mellitus), type 2 with renal complications ([place]) 19/37/9024  . Chronic kidney disease, stage III (moderate) ([place]) 07/26/2008  . CHRONIC PROSTATITIS 07/26/2008  . ADVEF, DRUG/MEDICINAL/BIOLOGICAL SUBST NOS 03/16/2007  . Dyslipidemia 11/15/2006  . Essential hypertension 11/15/2006  . GERD 11/15/2006  . Osteoarthritis 11/15/2006    Patient's Medications  New Prescriptions   No medications on file  Previous Medications   CEPHALEXIN (KEFLEX) 500 MG CAPSULE    Take 1 capsule (500 mg total) by mouth 3 (three) times daily.   CLOTRIMAZOLE-BETAMETHASONE (LOTRISONE) CREAM    APPLY TO AFFECTED AREA TWICE A DAY   DILTIAZEM (CARDIZEM CD) 360 MG 24 HR CAPSULE    TAKE 1 CAPSULE BY MOUTH EVERY DAY   ELIQUIS 2.5 MG TABS TABLET    TAKE 1  TABLET BY MOUTH TWICE A DAY   FERROUS SULFATE 325 (65 FE) MG TABLET    TAKE 1 TABLET BY MOUTH Monday and Friday   GLIPIZIDE (GLUCOTROL) 5 MG TABLET    TAKE 2 TABLETS BY MOUTH EVERY MORNING BEFORE BREAKFAST AND 1 TABLET BEFORE SUPPER   INSULIN GLARGINE (LANTUS SOLOSTAR) 100 UNIT/ML SOLOSTAR PEN    INJECT 30 UNITS INTO THE SKIN AT BEDTIME   INSULIN PEN NEEDLE (B-D UF III MINI PEN NEEDLES) 31G X 5 MM MISC    USE DAILY WITH INSULIN PEN   JANUVIA 50 MG TABLET    TAKE 1 TABLET BY MOUTH EVERY DAY   LUBIPROSTONE (AMITIZA) 8 MCG CAPSULE    TAKE ONE CAPSULE BY MOUTH EACH MORNING   OMEPRAZOLE (PRILOSEC) 40 MG CAPSULE    TAKE 1 CAPSULE BY MOUTH EVERY DAY   OPHTHALMIC IRRIGATION SOLUTION (EYE WASH) 99.05 % SOLN    Place 1 drop into both eyes 2 (two) times daily.   ROSUVASTATIN (CRESTOR) 20 MG TABLET    TAKE 1 TABLET BY MOUTH EVERY DAY   TORSEMIDE (DEMADEX) 10 MG TABLET    Take 10 mg by mouth daily.  Modified Medications   Modified Medication Previous Medication   AMOXICILLIN (AMOXIL) 500 MG CAPSULE amoxicillin (AMOXIL) 500 MG capsule      Take 1 capsule (500 mg total) by mouth 2 (two) times daily.    Take 1 capsule (500 mg total) by mouth 2 (two) times daily.  Discontinued  Medications   No medications on file    Subjective: Mr. Limes is in for his routine follow-up visit.  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  He completed 6 months of antibiotic therapy but had an early relapse of his infection and has been on amoxicillin ever since that time.  He recently developed some pain and redness behind his left ear.  He was seen by his PCP and started on empiric cephalexin.  Several days later he developed diffuse itching.  He was concerned that he might have too many antibiotics in his system so he stopped taking cephalexin and his amoxicillin for 1 week.  His itching resolved promptly but he started having severe pain in his left knee.  He did not have any new swelling, redness or  warmth.  He restarted his amoxicillin and the pain subsided within several days.  He has not had any further problems since that time.  Review of Systems: Review of Systems  Constitutional: Negative for fever.  Gastrointestinal: Negative for abdominal pain, diarrhea, nausea and vomiting.  Musculoskeletal: Positive for joint pain.  Skin: Positive for itching. Negative for rash.    Past Medical History:  Diagnosis Date  . Anemia   . Arthritis   . CAD (coronary artery disease)    a. CABG 1989, b. Myoview low risk 2012.  . Carotid artery occlusion    a. Duplex 10/2014: patent R/L CEA with mild hyperplasia in right surgical bulb - followed by vascular.  . CHB (complete heart block) [facility]) March 2016   a. s/p STJ dual chamber pacemaker 07/2014.  [name] Chronic diastolic CHF (congestive heart failure) ([place])    a. Dx 05/6107 - acute diastolic CHF in the setting of CHB.  [name] Chronic kidney disease, stage IV (severe) (HCC)    stage III/IV as of 2015, [name]  . Colon polyps   . Complication of anesthesia   . Constipation   . CVA (cerebral infarction)   . Diabetes mellitus    type II  . Diverticulosis   . Dupuytren's disease    finger right hand contracted  . Essential hypertension   . GERD (gastroesophageal reflux disease)   . Hemorrhoids   . Hyperlipidemia   . Myocardial infarction [facility]) 1976  and Mar. 19, 2016  . Orthostasis   . Osteoporosis   . PAF (paroxysmal atrial fibrillation) [facility]) March 2016  . PONV (postoperative nausea and vomiting)   . Presence of permanent cardiac pacemaker   . Shortness of breath dyspnea    due to pain    Social History   Tobacco Use  . Smoking status: Former Smoker    Packs/day: 1.00    Years: 25.00    Pack years: 25.00    Types: Cigarettes    Quit date: 01/21/1973    Years since quitting: 47.5  . Smokeless tobacco: Never Used  . Tobacco comment: began smoking in high school, quit age 45  Vaping Use  . Vaping Use: Never used   Substance Use Topics  . Alcohol use: Yes    Alcohol/week: 0.0 standard drinks    Comment: wine occ  . Drug use: No    Family History  Problem Relation Age of Onset  . Heart disease Mother        Before age 67  . Diabetes Mother   . Varicose Veins Mother   . Heart attack Mother   . Heart attack Father   . Heart disease Father  After age 78  . Hypertension Father   . Heart disease Brother        Before age 75  . Hypertension Brother   . Heart attack Brother   . Diabetes Son   . Hypertension Son   . Hypertension Son   . Diabetes Son   . Diabetes Other   . Cancer Other   . Colon cancer Neg Hx     Allergies  Allergen Reactions  . Zoledronic Acid Other (See Comments)    Stopped 2015 due to Creatine  Other reaction(s): Unknown Stopped 2015 due to Cr  . Nsaids Other (See Comments)    Held due to creatinine elevation.      Objective: Vitals:   07/11/20 0906  BP: 135/66  Pulse: 74  Temp: (!) 97.4 F (36.3 C)  TempSrc: Oral  Weight: 199 lb (90.3 kg)   Body mass index is 29.39 kg/m.  Physical Exam Constitutional:      Comments: He is in his usual good spirits.  Musculoskeletal:     Comments: His left knee incision remains fully healed.  There is no unusual swelling, warmth or redness of his left knee.  He has good range of motion.  Skin:    Findings: No rash.     Lab Results    Problem List Items Addressed This Visit      High   Infection of prosthetic left knee joint ([place])    His recent flare of left knee pain while he was off amoxicillin has reinforced his concerns that the amoxicillin is simply suppressing chronic enterococcal infection.  He chooses to remain on chronic suppressive amoxicillin for now.  I will check sed rate and C-reactive protein today.  He will follow-up in 1 year.      Relevant Medications   amoxicillin (AMOXIL) 500 MG capsule   Other Relevant Orders   C-reactive protein   Sedimentation rate       Michel Bickers,  MD [facility] [place] [phone] pager   [phone] cell 07/11/2020, 9:25 AM

## 2020-07-12 LAB — C-REACTIVE PROTEIN: CRP: 3.7 mg/L (ref <8.0)

## 2020-07-12 LAB — SEDIMENTATION RATE: Sed Rate: 6 mm/h (ref 0–20)

## 2020-07-30 ENCOUNTER — Ambulatory Visit (INDEPENDENT_AMBULATORY_CARE_PROVIDER_SITE_OTHER): Payer: Medicare Other

## 2020-07-30 DIAGNOSIS — I442 Atrioventricular block, complete: Secondary | ICD-10-CM

## 2020-07-30 LAB — CUP PACEART REMOTE DEVICE CHECK
Battery Remaining Longevity: 93 mo
Battery Remaining Percentage: 95.5 %
Battery Voltage: 2.98 V
Brady Statistic AP VP Percent: 7.8 %
Brady Statistic AP VS Percent: 1 %
Brady Statistic AS VP Percent: 92 %
Brady Statistic AS VS Percent: 1 %
Brady Statistic RA Percent Paced: 7.6 %
Brady Statistic RV Percent Paced: 99 %
Date Time Interrogation Session: 20220308020012
Implantable Lead Implant Date: 20160321
Implantable Lead Implant Date: 20160321
Implantable Lead Location: 753859
Implantable Lead Location: 753860
Implantable Pulse Generator Implant Date: 20160321
Lead Channel Impedance Value: 450 Ohm
Lead Channel Impedance Value: 490 Ohm
Lead Channel Pacing Threshold Amplitude: 0.75 V
Lead Channel Pacing Threshold Amplitude: 1.75 V
Lead Channel Pacing Threshold Pulse Width: 0.5 ms
Lead Channel Pacing Threshold Pulse Width: 0.8 ms
Lead Channel Sensing Intrinsic Amplitude: 11.8 mV
Lead Channel Sensing Intrinsic Amplitude: 2.3 mV
Lead Channel Setting Pacing Amplitude: 2.5 V
Lead Channel Setting Pacing Amplitude: 3 V
Lead Channel Setting Pacing Pulse Width: 0.5 ms
Lead Channel Setting Sensing Sensitivity: 8 mV
Pulse Gen Model: 2240
Pulse Gen Serial Number: 7734710

## 2020-08-03 ENCOUNTER — Other Ambulatory Visit: Payer: Self-pay | Admitting: Family Medicine

## 2020-08-05 ENCOUNTER — Ambulatory Visit: Payer: Medicare Other | Admitting: Internal Medicine

## 2020-08-05 ENCOUNTER — Other Ambulatory Visit: Payer: Self-pay

## 2020-08-05 ENCOUNTER — Encounter: Payer: Self-pay | Admitting: Internal Medicine

## 2020-08-05 VITALS — BP 154/66 | HR 75 | Ht 69.0 in | Wt 198.4 lb

## 2020-08-05 DIAGNOSIS — I5032 Chronic diastolic (congestive) heart failure: Secondary | ICD-10-CM | POA: Insufficient documentation

## 2020-08-05 DIAGNOSIS — I442 Atrioventricular block, complete: Secondary | ICD-10-CM | POA: Diagnosis not present

## 2020-08-05 DIAGNOSIS — Z950 Presence of cardiac pacemaker: Secondary | ICD-10-CM | POA: Diagnosis not present

## 2020-08-05 DIAGNOSIS — I480 Paroxysmal atrial fibrillation: Secondary | ICD-10-CM | POA: Diagnosis not present

## 2020-08-05 NOTE — Patient Instructions (Signed)
Medication Instructions:  Your physician recommends that you continue on your current medications as directed. Please refer to the Current Medication list given to you today.  Labwork: None ordered.  Testing/Procedures: None ordered.  Follow-Up: Your physician wants you to follow-up in: one year with Gregg Taylor, MD or one of the following Advanced Practice Providers on your designated Care Team:    Amber Seiler, NP  Renee Ursuy, PA-C  Michael "Andy" Tillery, PA-C  Remote monitoring is used to monitor your Pacemaker from home. This monitoring reduces the number of office visits required to check your device to one time per year. It allows us to keep an eye on the functioning of your device to ensure it is working properly. You are scheduled for a device check from home on 10/29/2020. You may send your transmission at any time that day. If you have a wireless device, the transmission will be sent automatically. After your physician reviews your transmission, you will receive a postcard with your next transmission date.  Any Other Special Instructions Will Be Listed Below (If Applicable).  If you need a refill on your cardiac medications before your next appointment, please call your pharmacy.       

## 2020-08-05 NOTE — Progress Notes (Signed)
HPI Mr. Casaus returns today for followup. He is a pleasant 87yo man with a h/o diastolic heart failure, complete heart block, s/p PPM insertion. He remains active despite several medical problems and an advanced age.He denies chest pain or sob. He has had a problem with his prosthetic knee and is on suppressive anti-biotics. No fevers/chills/night sweats. No edema. Allergies  Allergen Reactions  . Zoledronic Acid Other (See Comments)    Stopped 2015 due to Creatine  Other reaction(s): Unknown Stopped 2015 due to Cr  . Nsaids Other (See Comments)    Held due to creatinine elevation.       Current Outpatient Medications  Medication Sig Dispense Refill  . amoxicillin (AMOXIL) 500 MG capsule Take 1 capsule (500 mg total) by mouth 2 (two) times daily. 180 capsule 3  . B-D UF III MINI PEN NEEDLES 31G X 5 MM MISC USE DAILY WITH INSULIN PEN 100 each 4  . cephALEXin (KEFLEX) 500 MG capsule Take 1 capsule (500 mg total) by mouth 3 (three) times daily. 21 capsule 0  . clotrimazole-betamethasone (LOTRISONE) cream APPLY TO AFFECTED AREA TWICE A DAY 30 g 0  . diltiazem (CARDIZEM CD) 360 MG 24 hr capsule Take 1 capsule (360 mg total) by mouth daily. 90 capsule 0  . ELIQUIS 2.5 MG TABS tablet TAKE 1 TABLET BY MOUTH TWICE A DAY 180 tablet 1  . ferrous sulfate 325 (65 FE) MG tablet TAKE 1 TABLET BY MOUTH Monday and Friday    . glipiZIDE (GLUCOTROL) 5 MG tablet TAKE 2 TABLETS BY MOUTH EVERY MORNING BEFORE BREAKFAST AND 1 TABLET BEFORE SUPPER 270 tablet 3  . insulin glargine (LANTUS SOLOSTAR) 100 UNIT/ML Solostar Pen INJECT 30 UNITS INTO THE SKIN AT BEDTIME 15 mL 3  . JANUVIA 50 MG tablet TAKE 1 TABLET BY MOUTH EVERY DAY 90 tablet 0  . losartan (COZAAR) 100 MG tablet Take 1 tablet by mouth daily.    [name] lubiprostone (AMITIZA) 8 MCG capsule TAKE ONE CAPSULE BY MOUTH EACH MORNING 90 capsule 1  . omeprazole (PRILOSEC) 40 MG capsule TAKE 1 CAPSULE BY MOUTH EVERY DAY 90 capsule 1  . Ophthalmic Irrigation  Solution (EYE WASH) 99.05 % SOLN Place 1 drop into both eyes 2 (two) times daily.    . rosuvastatin (CRESTOR) 20 MG tablet TAKE 1 TABLET BY MOUTH EVERY DAY 90 tablet 0  . torsemide (DEMADEX) 10 MG tablet Take 10 mg by mouth daily.     No current facility-administered medications for this visit.     Past Medical History:  Diagnosis Date  . Anemia   . Arthritis   . CAD (coronary artery disease)    a. CABG 1989, b. Myoview low risk 2012.  . Carotid artery occlusion    a. Duplex 10/2014: patent R/L CEA with mild hyperplasia in right surgical bulb - followed by vascular.  . CHB (complete heart block) [facility]) March 2016   a. s/p STJ dual chamber pacemaker 07/2014.  [name] Chronic diastolic CHF (congestive heart failure) ([place])    a. Dx 10/4330 - acute diastolic CHF in the setting of CHB.  [name] Chronic kidney disease, stage IV (severe) (HCC)    stage III/IV as of 2015, [name]  . Colon polyps   . Complication of anesthesia   . Constipation   . CVA (cerebral infarction)   . Diabetes mellitus    type II  . Diverticulosis   . Dupuytren's disease    finger right hand contracted  .  Essential hypertension   . GERD (gastroesophageal reflux disease)   . Hemorrhoids   . Hyperlipidemia   . Myocardial infarction [facility]) 1976  and Mar. 19, 2016  . Orthostasis   . Osteoporosis   . PAF (paroxysmal atrial fibrillation) [facility]) March 2016  . PONV (postoperative nausea and vomiting)   . Presence of permanent cardiac pacemaker   . Shortness of breath dyspnea    due to pain    ROS:   All systems reviewed and negative except as noted in the HPI.   Past Surgical History:  Procedure Laterality Date  . CARDIAC CATHETERIZATION  08/11/2014   Procedure: TEMPORARY PACEMAKER;  Surgeon: [name], MD;  Location: [facility] CATH LAB;  Service: Cardiovascular;;  . CAROTID ENDARTERECTOMY  12/31/10   Right  . CAROTID ENDARTERECTOMY  02/09/11   left  . CHOLECYSTECTOMY    . CORONARY ARTERY BYPASS GRAFT  1989   . EYE SURGERY Bilateral    Cataract with implants  . I & D KNEE WITH POLY EXCHANGE Left 07/29/2015   Procedure: IRRIGATION AND DEBRIDEMENT LEFT KNEE WITH POLY EXCHANGE;  Surgeon: Rod Can, MD;  Location: [place];  Service: Orthopedics;  Laterality: Left;  . IR GENERIC HISTORICAL  03/04/2016   IR US GUIDE VASC ACCESS RIGHT 03/04/2016 Marybelle Killings, MD [facility]-INTER[facility]  . IR GENERIC HISTORICAL  03/04/2016   IR FLUORO GUIDE CV LINE RIGHT 03/04/2016 Marybelle Killings, MD [facility]INTER[facility]  . JOINT REPLACEMENT     bilat. knees  . PERMANENT PACEMAKER INSERTION N/A 08/13/2014   STJ dual chamber pacemaker implanted by [name] for CHB  . TONSILLECTOMY    . TOTAL KNEE ARTHROPLASTY     bilateral     Family History  Problem Relation Age of Onset  . Heart disease Mother        Before age 50  . Diabetes Mother   . Varicose Veins Mother   . Heart attack Mother   . Heart attack Father   . Heart disease Father        After age 85  . Hypertension Father   . Heart disease Brother        Before age 60  . Hypertension Brother   . Heart attack Brother   . Diabetes Son   . Hypertension Son   . Hypertension Son   . Diabetes Son   . Diabetes Other   . Cancer Other   . Colon cancer Neg Hx      Social History   Socioeconomic History  . Marital status: Married    Spouse name: Not on file  . Number of children: Not on file  . Years of education: Not on file  . Highest education level: Not on file  Occupational History  . Occupation: retired    Fish farm manager: RETIRED  Tobacco Use  . Smoking status: Former Smoker    Packs/day: 1.00    Years: 25.00    Pack years: 25.00    Types: Cigarettes    Quit date: 01/21/1973    Years since quitting: 47.5  . Smokeless tobacco: Never Used  . Tobacco comment: began smoking in high school, quit age 4  Vaping Use  . Vaping Use: Never used  Substance and Sexual Activity  . Alcohol use: Yes    Alcohol/week: 0.0 standard drinks    Comment: wine occ  . Drug use:  No  . Sexual activity: Never  Other Topics Concern  . 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  Remarried 74.     3 sons   Social Determinants of Radio broadcast assistant Strain: Not on file  Food Insecurity: Not on file  Transportation Needs: Not on file  Physical Activity: Not on file  Stress: Not on file  Social Connections: Not on file  Intimate Partner Violence: Not on file     BP (!) 154/66   Pulse 75   Ht 5\' 9"  (1.753 m)   Wt 198 lb 6.4 oz (90 kg)   SpO2 94%   BMI 29.30 kg/m   Physical Exam:  Well appearing NAD HEENT: Unremarkable Neck:  No JVD, no thyromegally Lymphatics:  No adenopathy Back:  No CVA tenderness Lungs:  Clear with no wheezes HEART:  Regular rate rhythm, no murmurs, no rubs, no clicks Abd:  soft, positive bowel sounds, no organomegally, no rebound, no guarding Ext:  2 plus pulses, no edema, no cyanosis, no clubbing Skin:  No rashes no nodules Neuro:  CN II through XII intact, motor grossly intact  EKG - NSR with ventricular pacing  DEVICE  Normal device function.  See PaceArt for details.   Assess/Plan:  1. CHB - he is s/p PPM and is asymptomatic. 2. PPM - his [employer]. Jude DDD PM is working normally. Will recheck in several months. 3. Knee infection - he will continue to take suppressive anti-biotics. No evidence of active systemic infection at this time. 4. HTN - his systolic blood pressure is not well controlled in the office but better at home. He will continue his current meds.  Carleene Overlie Taylor,MD

## 2020-08-07 NOTE — Progress Notes (Signed)
Remote pacemaker transmission.   

## 2020-08-07 NOTE — Progress Notes (Unsigned)
Cardiology Office Note   Date:  08/09/2020   ID:  Dean Murphy, DOB 04-11-1927, MRN [id number]  PCP:  Tonia Ghent, MD  Cardiologist: Ena Dawley MD CC: Follow Up   History of Present Illness: Dean Murphy is a very pleasant 87 y.o. male who presents for ongoing assessment and management of coronary artery disease with history of CABG in 1989, complete heart block in 9562 complicated by acute diastolic CHF, status post permanent pacemaker, carotid artery disease status post CEA with history of CVA (followed by [facility]), PAF, on Eliquis, hyperlipidemia, with other history to include chronic kidney disease stage III-IV, diabetes, and anemia.  Patient was last seen by Amie Portland, PA on 06/27/2019, and was doing well.  The patient had had a recent history of a mechanical fall and broke some ribs.  He denied any cardiac symptoms at that time. He was seen by Dr. Lovena Le for [facility] annual follow up.  His device was found to be functioning normally.  No changes to his medication regimen were made.  However it was noted that he was slightly hypertensive during the visit which will be followed on this office visit.  He comes today without any cardiac complaints.  He does complain of bilateral knee pain which slows him down some, but he remains as active as he can.  He states his wife likes to walk and advised him to join her but he cannot keep up with her due to his knees.  He is being followed very closely by Dr. Candiss Norse for chronic kidney disease and has been having labs completed often.  He has been medically compliant.  Dr. Candiss Norse did change his Lasix to torsemide 10 mg as needed.  He offers no complaints of dizziness, hypotension, or presyncope.  Blood pressure is low normal today.    Past Medical History:  Diagnosis Date  . Anemia   . Arthritis   . CAD (coronary artery disease)    a. CABG 1989, b. Myoview low risk 2012.  . Carotid artery occlusion    a. Duplex 10/2014: patent R/L CEA with mild  hyperplasia in right surgical bulb - followed by vascular.  . CHB (complete heart block) [facility]) March 2016   a. s/p STJ dual chamber pacemaker 07/2014.  [name] Chronic diastolic CHF (congestive heart failure) ([place])    a. Dx 05/3084 - acute diastolic CHF in the setting of CHB.  [name] Chronic kidney disease, stage IV (severe) (HCC)    stage III/IV as of 2015, [name]  . Colon polyps   . Complication of anesthesia   . Constipation   . CVA (cerebral infarction)   . Diabetes mellitus    type II  . Diverticulosis   . Dupuytren's disease    finger right hand contracted  . Essential hypertension   . GERD (gastroesophageal reflux disease)   . Hemorrhoids   . Hyperlipidemia   . Myocardial infarction [facility]) 1976  and Mar. 19, 2016  . Orthostasis   . Osteoporosis   . PAF (paroxysmal atrial fibrillation) [facility]) March 2016  . PONV (postoperative nausea and vomiting)   . Presence of permanent cardiac pacemaker   . Shortness of breath dyspnea    due to pain    Past Surgical History:  Procedure Laterality Date  . CARDIAC CATHETERIZATION  08/11/2014   Procedure: TEMPORARY PACEMAKER;  Surgeon: [name], MD;  Location: [facility] CATH LAB;  Service: Cardiovascular;;  . CAROTID ENDARTERECTOMY  12/31/10   Right  .  CAROTID ENDARTERECTOMY  02/09/11   left  . CHOLECYSTECTOMY    . CORONARY ARTERY BYPASS GRAFT  1989  . EYE SURGERY Bilateral    Cataract with implants  . I & D KNEE WITH POLY EXCHANGE Left 07/29/2015   Procedure: IRRIGATION AND DEBRIDEMENT LEFT KNEE WITH POLY EXCHANGE;  Surgeon: Rod Can, MD;  Location: [place];  Service: Orthopedics;  Laterality: Left;  . IR GENERIC HISTORICAL  03/04/2016   IR US GUIDE VASC ACCESS RIGHT 03/04/2016 Marybelle Killings, MD [facility]INTER[facility]  . IR GENERIC HISTORICAL  03/04/2016   IR FLUORO GUIDE CV LINE RIGHT 03/04/2016 Marybelle Killings, MD [facility]INTER[facility]  . JOINT REPLACEMENT     bilat. knees  . PERMANENT PACEMAKER INSERTION N/A 08/13/2014   STJ dual chamber pacemaker  implanted by [name] for CHB  . TONSILLECTOMY    . TOTAL KNEE ARTHROPLASTY     bilateral     Current Outpatient Medications  Medication Sig Dispense Refill  . amoxicillin (AMOXIL) 500 MG capsule Take 1 capsule (500 mg total) by mouth 2 (two) times daily. 180 capsule 3  . B-D UF III MINI PEN NEEDLES 31G X 5 MM MISC USE DAILY WITH INSULIN PEN 100 each 4  . cephALEXin (KEFLEX) 500 MG capsule Take 1 capsule (500 mg total) by mouth 3 (three) times daily. 21 capsule 0  . diltiazem (CARDIZEM CD) 360 MG 24 hr capsule Take 1 capsule (360 mg total) by mouth daily. 90 capsule 0  . ELIQUIS 2.5 MG TABS tablet TAKE 1 TABLET BY MOUTH TWICE A DAY 180 tablet 1  . ferrous sulfate 325 (65 FE) MG tablet TAKE 1 TABLET BY MOUTH Monday and Friday    . glipiZIDE (GLUCOTROL) 5 MG tablet TAKE 2 TABLETS BY MOUTH EVERY MORNING BEFORE BREAKFAST AND 1 TABLET BEFORE SUPPER 270 tablet 3  . insulin glargine (LANTUS SOLOSTAR) 100 UNIT/ML Solostar Pen INJECT 30 UNITS INTO THE SKIN AT BEDTIME 15 mL 3  . JANUVIA 50 MG tablet TAKE 1 TABLET BY MOUTH EVERY DAY 90 tablet 0  . lubiprostone (AMITIZA) 8 MCG capsule TAKE ONE CAPSULE BY MOUTH EACH MORNING 90 capsule 1  . omeprazole (PRILOSEC) 40 MG capsule TAKE 1 CAPSULE BY MOUTH EVERY DAY 90 capsule 1  . rosuvastatin (CRESTOR) 20 MG tablet TAKE 1 TABLET BY MOUTH EVERY DAY 90 tablet 0  . torsemide (DEMADEX) 10 MG tablet Take 10 mg by mouth daily.     No current facility-administered medications for this visit.    Allergies:   Zoledronic acid and Nsaids    Social History:  The patient  reports that he quit smoking about 47 years ago. His smoking use included cigarettes. He has a 25.00 pack-year smoking history. He has never used smokeless tobacco. He reports current alcohol use. He reports that he does not use drugs.   Family History:  The patient's family history includes Cancer in an other family member; Diabetes in his mother, son, son, and another family member; Heart attack  in his brother, father, and mother; Heart disease in his brother, father, and mother; Hypertension in his brother, father, son, and son; Varicose Veins in his mother.    ROS: All other systems are reviewed and negative. Unless otherwise mentioned in H&P    PHYSICAL EXAM: VS:  BP (!) 114/56   Pulse 71   Ht 5\' 9"  (1.753 m)   Wt 197 lb (89.4 kg)   SpO2 98%   BMI 29.09 kg/m  , BMI Body mass  index is 29.09 kg/m. GEN: Well nourished, well developed, in no acute distress, looking younger than stated age 34: normal Neck: no JVD, no radiation carotid bruits, or masses Cardiac: RRR; 2/6 systolic murmur at the right sternal border, 1/6 systolic murmur at the left sternal border, murmurs, rubs, or gallops,no edema  Respiratory:  Clear to auscultation bilaterally, normal work of breathing GI: soft, nontender, nondistended, + BS MS: no deformity or atrophy Skin: warm and dry, no rash Neuro:  Strength and sensation are intact Psych: euthymic mood, full affect   EKG: Not completed this office visit.  Recent Labs: 04/12/2020: ALT 26; Hemoglobin 12.9; Platelets 145; TSH 2.22 04/17/2020: BUN 33; Creatinine, Ser 2.48; Potassium 4.8; Sodium 141    Lipid Panel    Component Value Date/Time   CHOL 143 04/12/2020 1705   TRIG 142 04/12/2020 1705   TRIG 62 04/28/2006 0917   HDL 49 04/12/2020 1705   CHOLHDL 2.9 04/12/2020 1705   VLDL 28.6 07/12/2019 0801   LDLCALC 71 04/12/2020 1705   LDLDIRECT 135.1 03/12/2014 0812      Wt Readings from Last 3 Encounters:  08/09/20 197 lb (89.4 kg)  08/05/20 198 lb 6.4 oz (90 kg)  07/11/20 199 lb (90.3 kg)      Other studies Reviewed: 2D echo 08/2017 Study Conclusions   - Left ventricle: The cavity size was normal. There was moderate  concentric hypertrophy. Systolic function was normal. The  estimated ejection fraction was in the range of 55% to 60%.  Hypokinesis of the apical septal myocardium. Doppler parameters  are consistent with  abnormal left ventricular relaxation (grade 1  diastolic dysfunction). Doppler parameters are consistent with  indetermiante ventricular filling pressure.  - Aortic valve: Transvalvular velocity was within the normal range.  There was no stenosis. There was no regurgitation.  - Mitral valve: Transvalvular velocity was within the normal range.  There was no evidence for stenosis. There was mild regurgitation.  - Left atrium: The atrium was severely dilated.  - Right ventricle: The cavity size was normal. Wall thickness was  normal. Systolic function was normal.  - Atrial septum: No defect or patent foramen ovale was identified  by color flow Doppler.  - Tricuspid valve: There was trivial regurgitation.  - Pulmonary arteries: Systolic pressure was mildly increased. PA  peak pressure: 41 mm Hg (S).    ASSESSMENT AND PLAN:  1.  Coronary artery disease: History of coronary artery bypass grafting with low risk Myoview in 2012.  No cardiac complaints.  Continue secondary management with diltiazem, losartan, and statin therapy.  Continue exercise and healthy diet.  2.  Carotid artery disease: Status post carotid artery endarterectomy no bruits are auscultated during examination.  Continue statin therapy and Eliquis.  3.  Status post permanent pacemaker: Followed by Dr. Lovena Le, seen last week.  Pacemaker in place due to complete heart block in 2016.  4.  Hyperlipidemia: Remains on rosuvastatin 20 mg daily.  Most recent labs 04/13/2020 with total cholesterol 143, HDL, 49, LDL 71.  Goal of LDL less than 70 consistently.  No change in his regimen at this time.  5.  Chronic kidney disease stage III/IV followed by Dr. Candiss Norse.  Multiple labs drawn, sees him often, with addition of torsemide on last office visit due to lower extremity edema.  He is taking it as needed and has not had any dizziness or hypotension related to use of the stronger diuretic.   He will now be established with Dr.  Gwyndolyn Kaufman,  as Dr. Meda Coffee is relocating.  I have explained this to him and answered questions.  He is given a picture and a card with her name on it so that he is aware of his new cardiologist name and contact.  Follow-up with her in 6 months to be established.  Current medicines are reviewed at length with the patient today.  I have spent 25 min's dedicated to the care of this patient on the date of this encounter to include pre-visit review of records, assessment, management and diagnostic testing,with shared decision making.  Labs/ tests ordered today include: None Phill Myron. West Pugh, ANP, [facility]   08/09/2020 10:24 AM    [facility] [address] Office [phone] Fax [phone]  Notice: This dictation was prepared with Dragon dictation along with smaller phrase technology. Any transcriptional errors that result from this process are unintentional and may not be corrected upon review.

## 2020-08-09 ENCOUNTER — Other Ambulatory Visit: Payer: Self-pay

## 2020-08-09 ENCOUNTER — Encounter (INDEPENDENT_AMBULATORY_CARE_PROVIDER_SITE_OTHER): Payer: Self-pay

## 2020-08-09 ENCOUNTER — Ambulatory Visit: Payer: Medicare Other | Admitting: Adult Health

## 2020-08-09 ENCOUNTER — Encounter: Payer: Self-pay | Admitting: Adult Health

## 2020-08-09 VITALS — BP 114/56 | HR 71 | Ht 69.0 in | Wt 197.0 lb

## 2020-08-09 DIAGNOSIS — Z950 Presence of cardiac pacemaker: Secondary | ICD-10-CM

## 2020-08-09 DIAGNOSIS — I480 Paroxysmal atrial fibrillation: Secondary | ICD-10-CM

## 2020-08-09 DIAGNOSIS — I442 Atrioventricular block, complete: Secondary | ICD-10-CM | POA: Diagnosis not present

## 2020-08-09 DIAGNOSIS — I10 Essential (primary) hypertension: Secondary | ICD-10-CM | POA: Diagnosis not present

## 2020-08-09 DIAGNOSIS — I358 Other nonrheumatic aortic valve disorders: Secondary | ICD-10-CM

## 2020-08-09 DIAGNOSIS — I25810 Atherosclerosis of coronary artery bypass graft(s) without angina pectoris: Secondary | ICD-10-CM

## 2020-08-09 DIAGNOSIS — E7800 Pure hypercholesterolemia, unspecified: Secondary | ICD-10-CM

## 2020-08-09 DIAGNOSIS — Z951 Presence of aortocoronary bypass graft: Secondary | ICD-10-CM

## 2020-08-09 NOTE — Patient Instructions (Addendum)
Medication Instructions:  Your physician recommends that you continue on your current medications as directed. Please refer to the Current Medication list given to you today.  *If you need a refill on your cardiac medications before your next appointment, please call your pharmacy*   Lab Work: None ordered  If you have labs (blood work) drawn today and your tests are completely normal, you will receive your results only by: . MyChart Message (if you have MyChart) OR . A paper copy in the mail If you have any lab test that is abnormal or we need to change your treatment, we will call you to review the results.   Testing/Procedures: None ordered   Follow-Up: At [facility], you and your health needs are our priority.  As part of our continuing mission to provide you with exceptional heart care, we have created designated Provider Care Teams.  These Care Teams include your primary Cardiologist (physician) and Advanced Practice Providers (APPs -  Physician Assistants and Nurse Practitioners) who all work together to provide you with the care you need, when you need it.  We recommend signing up for the patient portal called "MyChart".  Sign up information is provided on this After Visit Summary.  MyChart is used to connect with patients for Virtual Visits (Telemedicine).  Patients are able to view lab/test results, encounter notes, upcoming appointments, etc.  Non-urgent messages can be sent to your provider as well.   To learn more about what you can do with MyChart, go to [email or web address].    Your next appointment:   6 month(s)  The format for your next appointment:   In Person  Provider:   Heather Pemberton, MD   Other Instructions   

## 2020-08-13 ENCOUNTER — Ambulatory Visit: Payer: Medicare Other | Admitting: Family Medicine

## 2020-08-13 ENCOUNTER — Other Ambulatory Visit: Payer: Self-pay

## 2020-08-13 ENCOUNTER — Encounter: Payer: Self-pay | Admitting: Family Medicine

## 2020-08-13 VITALS — BP 144/78 | HR 92 | Temp 97.4°F | Ht 69.0 in | Wt 193.0 lb

## 2020-08-13 DIAGNOSIS — I10 Essential (primary) hypertension: Secondary | ICD-10-CM

## 2020-08-13 DIAGNOSIS — E1122 Type 2 diabetes mellitus with diabetic chronic kidney disease: Secondary | ICD-10-CM

## 2020-08-13 LAB — POCT GLYCOSYLATED HEMOGLOBIN (HGB A1C): Hemoglobin A1C: 8.2 % — AB (ref 4.0–5.6)

## 2020-08-13 MED ORDER — LOSARTAN POTASSIUM 100 MG PO TABS: 100.0000 mg | ORAL_TABLET | Freq: Every day | ORAL | Status: DC

## 2020-08-13 MED ORDER — TORSEMIDE 10 MG PO TABS: 10.0000 mg | ORAL_TABLET | Freq: Every day | ORAL | Status: DC | PRN

## 2020-08-13 NOTE — Patient Instructions (Addendum)
Try taking torsemide in the AM and see if that helps with swelling at the end of the day.  I'll check on the losartan dose but I expect cardiology will want to have you restart it.  Take care.  Glad to see you. Plan on recheck in about 4 months, A1c at the visit.

## 2020-08-13 NOTE — Progress Notes (Signed)
This visit occurred during the SARS-CoV-2 public health emergency.  Safety protocols were in place, including screening questions prior to the visit, additional usage of staff PPE, and extensive cleaning of exam room while observing appropriate contact time as indicated for disinfecting solutions.  Diabetes:  Using medications without difficulties: yes Hypoglycemic episodes: no Hyperglycemic episodes: no Feet problems: no sx in the day but some toe swelling at the end of the day.  He has been taking torsemide at night.  Blood Sugars averaging: sugar 122 this AM eye exam within last year: Dr. Prudencio Burly (in [place]) A1c down from 9 to 8.2, d/w pt at [place]. He is drinking more water and cut out other drinks.     BP 124/70 this AM on home check.  He stopped losartan after last cardiac OV.  I will check with cardiology about losartan cessation.    He had ortho f/u, still on abx at baseline.  No fevers.  Walking fine "other than my age."   Meds, vitals, and allergies reviewed.   ROS: Per HPI unless specifically indicated in ROS section   GEN: nad, alert and oriented HEENT: NECK: supple w/o LA CV: rrr. PULM: ctab, no inc wob ABD: soft, +bs EXT: trace L foot edema, no R foot edema.   SKIN: no acute rash  Diabetic foot exam: Normal inspection No skin breakdown No calluses  Normal DP pulses Normal sensation to light touch and monofilament on R foot, dec but not absent sensation L foot.  Nails normal

## 2020-08-14 NOTE — Assessment & Plan Note (Addendum)
BP 124/70 this AM on home check.  He stopped losartan after last cardiac OV.  I will check with cardiology about losartan cessation.    It looks like the medication was canceled at the office visit with cardiology but the notes state to continue losartan.  Given his current blood pressure it might make sense to decrease the dose but not stop the medicine totally.  I will defer to cardiology.  The only change I made was asking him to take the torsemide in the morning instead of the end of the day, to see if that would help with swelling in the evening and decrease nocturia.

## 2020-08-14 NOTE — Assessment & Plan Note (Signed)
A1c down from 9 to 8.2, d/w pt at [place]. He is drinking more water and cut out other drinks.     No change in medications at this point as I do not want to induce hypotension.  Foot care discussed with patient.  Recheck periodically.  He agrees.  See after visit summary.  35 minutes were devoted to patient care in this encounter (this includes time spent reviewing the patient's file/history, interviewing and examining the patient, counseling/reviewing plan with patient).

## 2020-10-16 ENCOUNTER — Other Ambulatory Visit: Payer: Self-pay | Admitting: Family Medicine

## 2020-10-29 ENCOUNTER — Ambulatory Visit (INDEPENDENT_AMBULATORY_CARE_PROVIDER_SITE_OTHER): Payer: Medicare Other

## 2020-10-29 DIAGNOSIS — I442 Atrioventricular block, complete: Secondary | ICD-10-CM

## 2020-10-29 LAB — CUP PACEART REMOTE DEVICE CHECK
Battery Remaining Longevity: 91 mo
Battery Remaining Percentage: 95.5 %
Battery Voltage: 2.98 V
Brady Statistic AP VP Percent: 6.7 %
Brady Statistic AP VS Percent: 1 %
Brady Statistic AS VP Percent: 93 %
Brady Statistic AS VS Percent: 1 %
Brady Statistic RA Percent Paced: 6.4 %
Brady Statistic RV Percent Paced: 99 %
Date Time Interrogation Session: 20220607020014
Implantable Lead Implant Date: 20160321
Implantable Lead Implant Date: 20160321
Implantable Lead Location: 753859
Implantable Lead Location: 753860
Implantable Pulse Generator Implant Date: 20160321
Lead Channel Impedance Value: 410 Ohm
Lead Channel Impedance Value: 430 Ohm
Lead Channel Pacing Threshold Amplitude: 0.5 V
Lead Channel Pacing Threshold Amplitude: 1.5 V
Lead Channel Pacing Threshold Pulse Width: 0.5 ms
Lead Channel Pacing Threshold Pulse Width: 0.8 ms
Lead Channel Sensing Intrinsic Amplitude: 11.8 mV
Lead Channel Sensing Intrinsic Amplitude: 3 mV
Lead Channel Setting Pacing Amplitude: 2.5 V
Lead Channel Setting Pacing Amplitude: 3 V
Lead Channel Setting Pacing Pulse Width: 0.5 ms
Lead Channel Setting Sensing Sensitivity: 8 mV
Pulse Gen Model: 2240
Pulse Gen Serial Number: 7734710

## 2020-11-11 ENCOUNTER — Other Ambulatory Visit: Payer: Self-pay | Admitting: *Deleted

## 2020-11-11 DIAGNOSIS — I10 Essential (primary) hypertension: Secondary | ICD-10-CM

## 2020-11-11 DIAGNOSIS — E785 Hyperlipidemia, unspecified: Secondary | ICD-10-CM

## 2020-11-11 MED ORDER — ROSUVASTATIN CALCIUM 20 MG PO TABS: 20.0000 mg | ORAL_TABLET | Freq: Every day | ORAL | 3 refills | Status: DC

## 2020-11-20 ENCOUNTER — Other Ambulatory Visit: Payer: Self-pay | Admitting: Family Medicine

## 2020-11-20 ENCOUNTER — Other Ambulatory Visit: Payer: Self-pay | Admitting: Internal Medicine

## 2020-11-20 DIAGNOSIS — I480 Paroxysmal atrial fibrillation: Secondary | ICD-10-CM

## 2020-11-20 MED ORDER — DILTIAZEM HCL ER COATED BEADS 360 MG PO CP24: 360.0000 mg | ORAL_CAPSULE | Freq: Every day | ORAL | 0 refills | Status: DC

## 2020-11-20 NOTE — Telephone Encounter (Signed)
r 

## 2020-11-20 NOTE — Telephone Encounter (Signed)
Prescription refill request for Eliquis received. Indication: a fib Last office visit: 08/09/20 Scr: 2.37 Age: 87 Weight: 87 kg

## 2020-11-20 NOTE — Progress Notes (Signed)
Remote pacemaker transmission.   

## 2020-12-12 ENCOUNTER — Ambulatory Visit (INDEPENDENT_AMBULATORY_CARE_PROVIDER_SITE_OTHER): Payer: Medicare Other

## 2020-12-12 DIAGNOSIS — Z0000 Encounter for general adult medical examination without abnormal findings: Secondary | ICD-10-CM | POA: Diagnosis not present

## 2020-12-12 NOTE — Progress Notes (Signed)
PCP notes:  Health Maintenance: Shingrix- due   Abnormal Screenings: none   Patient concerns: none   Nurse concerns: none   Next PCP appt.: 12/17/20 @ 11 am

## 2020-12-12 NOTE — Patient Instructions (Signed)
Dean Murphy , Thank you for taking time to come for your Medicare Wellness Visit. I appreciate your ongoing commitment to your health goals. Please review the following plan we discussed and let me know if I can assist you in the future.   Screening recommendations/referrals: Colonoscopy: no longer required  Recommended yearly ophthalmology/optometry visit for glaucoma screening and checkup Recommended yearly dental visit for hygiene and checkup  Vaccinations: Influenza vaccine: Up to date, completed 03/08/2020, due 12/2020 Pneumococcal vaccine: Completed series Tdap vaccine: decline-insurance  Shingles vaccine: due, check with your insurance regarding coverage if interested    Covid-19: completed 3 vaccines   Advanced directives: Advance directive discussed with you today. Even though you declined this today please call our office should you change your mind and we can give you the proper paperwork for you to fill out.  Conditions/risks identified: diabetes, hypertension, dyslipidemia   Next appointment: Follow up in one year for your annual wellness visit.   Preventive Care 3 Years and Older, Male Preventive care refers to lifestyle choices and visits with your health care provider that can promote health and wellness. What does preventive care include? A yearly physical exam. This is also called an annual well check. Dental exams once or twice a year. Routine eye exams. Ask your health care provider how often you should have your eyes checked. Personal lifestyle choices, including: Daily care of your teeth and gums. Regular physical activity. Eating a healthy diet. Avoiding tobacco and drug use. Limiting alcohol use. Practicing safe sex. Taking low doses of aspirin every day. Taking vitamin and mineral supplements as recommended by your health care provider. What happens during an annual well check? The services and screenings done by your health care provider during your annual  well check will depend on your age, overall health, lifestyle risk factors, and family history of disease. Counseling  Your health care provider may ask you questions about your: Alcohol use. Tobacco use. Drug use. Emotional well-being. Home and relationship well-being. Sexual activity. Eating habits. History of falls. Memory and ability to understand (cognition). Work and work Statistician. Screening  You may have the following tests or measurements: Height, weight, and BMI. Blood pressure. Lipid and cholesterol levels. These may be checked every 5 years, or more frequently if you are over 70 years old. Skin check. Lung cancer screening. You may have this screening every year starting at age 94 if you have a 30-pack-year history of smoking and currently smoke or have quit within the past 15 years. Fecal occult blood test (FOBT) of the stool. You may have this test every year starting at age 8. Flexible sigmoidoscopy or colonoscopy. You may have a sigmoidoscopy every 5 years or a colonoscopy every 10 years starting at age 12. Prostate cancer screening. Recommendations will vary depending on your family history and other risks. Hepatitis C blood test. Hepatitis B blood test. Sexually transmitted disease (STD) testing. Diabetes screening. This is done by checking your blood sugar (glucose) after you have not eaten for a while (fasting). You may have this done every 1-3 years. Abdominal aortic aneurysm (AAA) screening. You may need this if you are a current or former smoker. Osteoporosis. You may be screened starting at age 53 if you are at high risk. Talk with your health care provider about your test results, treatment options, and if necessary, the need for more tests. Vaccines  Your health care provider may recommend certain vaccines, such as: Influenza vaccine. This is recommended every year. Tetanus, diphtheria,  and acellular pertussis (Tdap, Td) vaccine. You may need a Td booster  every 10 years. Zoster vaccine. You may need this after age 102. Pneumococcal 13-valent conjugate (PCV13) vaccine. One dose is recommended after age 63. Pneumococcal polysaccharide (PPSV23) vaccine. One dose is recommended after age 39. Talk to your health care provider about which screenings and vaccines you need and how often you need them. This information is not intended to replace advice given to you by your health care provider. Make sure you discuss any questions you have with your health care provider. Document Released: 06/07/2015 Document Revised: 01/29/2016 Document Reviewed: 03/12/2015 Elsevier Interactive Patient Education  2017 [place] Prevention in the Home Falls can cause injuries. They can happen to people of all ages. There are many things you can do to make your home safe and to help prevent falls. What can I do on the outside of my home? Regularly fix the edges of walkways and driveways and fix any cracks. Remove anything that might make you trip as you walk through a door, such as a raised step or threshold. Trim any bushes or trees on the path to your home. Use bright outdoor lighting. Clear any walking paths of anything that might make someone trip, such as rocks or tools. Regularly check to see if handrails are loose or broken. Make sure that both sides of any steps have handrails. Any raised decks and porches should have guardrails on the edges. Have any leaves, snow, or ice cleared regularly. Use sand or salt on walking paths during winter. Clean up any spills in your garage right away. This includes oil or grease spills. What can I do in the bathroom? Use night lights. Install grab bars by the toilet and in the tub and shower. Do not use towel bars as grab bars. Use non-skid mats or decals in the tub or shower. If you need to sit down in the shower, use a plastic, non-slip stool. Keep the floor dry. Clean up any water that spills on the floor as soon  as it happens. Remove soap buildup in the tub or shower regularly. Attach bath mats securely with double-sided non-slip rug tape. Do not have throw rugs and other things on the floor that can make you trip. What can I do in the bedroom? Use night lights. Make sure that you have a light by your bed that is easy to reach. Do not use any sheets or blankets that are too big for your bed. They should not hang down onto the floor. Have a firm chair that has side arms. You can use this for support while you get dressed. Do not have throw rugs and other things on the floor that can make you trip. What can I do in the kitchen? Clean up any spills right away. Avoid walking on wet floors. Keep items that you use a lot in easy-to-reach places. If you need to reach something above you, use a strong step stool that has a grab bar. Keep electrical cords out of the way. Do not use floor polish or wax that makes floors slippery. If you must use wax, use non-skid floor wax. Do not have throw rugs and other things on the floor that can make you trip. What can I do with my stairs? Do not leave any items on the stairs. Make sure that there are handrails on both sides of the stairs and use them. Fix handrails that are broken or loose. Make sure  that handrails are as long as the stairways. Check any carpeting to make sure that it is firmly attached to the stairs. Fix any carpet that is loose or worn. Avoid having throw rugs at the top or bottom of the stairs. If you do have throw rugs, attach them to the floor with carpet tape. Make sure that you have a light switch at the top of the stairs and the bottom of the stairs. If you do not have them, ask someone to add them for you. What else can I do to help prevent falls? Wear shoes that: Do not have high heels. Have rubber bottoms. Are comfortable and fit you well. Are closed at the toe. Do not wear sandals. If you use a stepladder: Make sure that it is fully  opened. Do not climb a closed stepladder. Make sure that both sides of the stepladder are locked into place. Ask someone to hold it for you, if possible. Clearly mark and make sure that you can see: Any grab bars or handrails. First and last steps. Where the edge of each step is. Use tools that help you move around (mobility aids) if they are needed. These include: Canes. Walkers. Scooters. Crutches. Turn on the lights when you go into a dark area. Replace any light bulbs as soon as they burn out. Set up your furniture so you have a clear path. Avoid moving your furniture around. If any of your floors are uneven, fix them. If there are any pets around you, be aware of where they are. Review your medicines with your doctor. Some medicines can make you feel dizzy. This can increase your chance of falling. Ask your doctor what other things that you can do to help prevent falls. This information is not intended to replace advice given to you by your health care provider. Make sure you discuss any questions you have with your health care provider. Document Released: 03/07/2009 Document Revised: 10/17/2015 Document Reviewed: 06/15/2014 Elsevier Interactive Patient Education  2017 [employer].

## 2020-12-12 NOTE — Progress Notes (Signed)
Subjective:   Dean Murphy is a 87 y.o. male who presents for Medicare Annual/Subsequent preventive examination.  Review of Systems: N/A      I connected with the patient today by telephone and verified that I am speaking with the correct person using two identifiers. Location patient: home Location nurse: work Persons participating in the telephone visit: patient, nurse.   I discussed the limitations, risks, security and privacy concerns of performing an evaluation and management service by telephone and the availability of in person appointments. I also discussed with the patient that there may be a patient responsible charge related to this service. The patient expressed understanding and verbally consented to this telephonic visit.        Cardiac Risk Factors include: advanced age (>56men, >21 women);male gender;diabetes mellitus;hypertension;dyslipidemia     Objective:    Today's Vitals   There is no height or weight on file to calculate BMI.  Advanced Directives 12/12/2020 07/12/2019 07/11/2018 07/07/2018 11/05/2016 06/04/2016 10/31/2015  Does Patient Have a Medical Advance Directive? No No No Yes Yes Yes Yes  Type of Advance Directive - - - Living will;Healthcare Power of [place] will  Does patient want to make changes to medical advance directive? - - - - - - No - Patient declined  Copy of [place] in Chart? - - - No - copy requested - No - copy requested No - copy requested  Would patient like information on creating a medical advance directive? No - Patient declined No - Patient declined - No - Patient declined - - -    Current Medications (verified) Outpatient Encounter Medications as of 12/12/2020  Medication Sig   amoxicillin (AMOXIL) 500 MG capsule Take 1 capsule (500 mg total) by mouth 2 (two) times daily.   B-D UF III MINI PEN NEEDLES 31G X 5 MM MISC USE DAILY WITH INSULIN PEN    diltiazem (CARDIZEM CD) 360 MG 24 hr capsule Take 1 capsule (360 mg total) by mouth daily. Please make appt with Dr. Johney Frame (Cardiologist) for September 2022 for future refills. Thank you 1st attempt   ELIQUIS 2.5 MG TABS tablet TAKE 1 TABLET BY MOUTH TWICE A DAY   ferrous sulfate 325 (65 FE) MG tablet TAKE 1 TABLET BY MOUTH Monday and Friday   glipiZIDE (GLUCOTROL) 5 MG tablet TAKE 2 TABLETS BY MOUTH EVERY MORNING BEFORE BREAKFAST AND 1 TABLET BEFORE SUPPER   insulin glargine (LANTUS SOLOSTAR) 100 UNIT/ML Solostar Pen INJECT 30 UNITS INTO THE SKIN AT BEDTIME   JANUVIA 50 MG tablet TAKE 1 TABLET BY MOUTH EVERY DAY   losartan (COZAAR) 100 MG tablet Take 1 tablet (100 mg total) by mouth daily.   lubiprostone (AMITIZA) 8 MCG capsule TAKE ONE CAPSULE BY MOUTH EACH MORNING   omeprazole (PRILOSEC) 40 MG capsule TAKE 1 CAPSULE BY MOUTH EVERY DAY   rosuvastatin (CRESTOR) 20 MG tablet Take 1 tablet (20 mg total) by mouth daily.   torsemide (DEMADEX) 10 MG tablet Take 1 tablet (10 mg total) by mouth daily as needed (for swelling).   No facility-administered encounter medications on file as of 12/12/2020.    Allergies (verified) Zoledronic acid and Nsaids   History: Past Medical History:  Diagnosis Date   Anemia    Arthritis    CAD (coronary artery disease)    a. CABG 1989, b. Myoview low risk 2012.   Carotid artery occlusion    a. Duplex 10/2014:  patent R/L CEA with mild hyperplasia in right surgical bulb - followed by vascular.   CHB (complete heart block) [facility]) March 2016   a. s/p STJ dual chamber pacemaker 07/2014.   Chronic diastolic CHF (congestive heart failure) ([place])    a. Dx 05/7492 - acute diastolic CHF in the setting of CHB.   Chronic kidney disease, stage IV (severe) (HCC)    stage III/IV as of 2015, [name]   Colon polyps    Complication of anesthesia    Constipation    CVA (cerebral infarction)    Diabetes mellitus    type II   Diverticulosis    Dupuytren's  disease    finger right hand contracted   Essential hypertension    GERD (gastroesophageal reflux disease)    Hemorrhoids    Hyperlipidemia    Myocardial infarction ([place]) 1976  and Mar. 19, 2016   Orthostasis    Osteoporosis    PAF (paroxysmal atrial fibrillation) ([place]) March 2016   PONV (postoperative nausea and vomiting)    Presence of permanent cardiac pacemaker    Shortness of breath dyspnea    due to pain   Past Surgical History:  Procedure Laterality Date   CARDIAC CATHETERIZATION  08/11/2014   Procedure: TEMPORARY PACEMAKER;  Surgeon: [name], MD;  Location: [facility] CATH LAB;  Service: Cardiovascular;;   CAROTID ENDARTERECTOMY  12/31/10   Right   CAROTID ENDARTERECTOMY  02/09/11   left   CHOLECYSTECTOMY     CORONARY ARTERY BYPASS GRAFT  1989   EYE SURGERY Bilateral    Cataract with implants   I & D KNEE WITH POLY EXCHANGE Left 07/29/2015   Procedure: IRRIGATION AND DEBRIDEMENT LEFT KNEE WITH POLY EXCHANGE;  Surgeon: Rod Can, MD;  Location: [facility];  Service: Orthopedics;  Laterality: Left;   IR GENERIC HISTORICAL  03/04/2016   IR US GUIDE VASC ACCESS RIGHT 03/04/2016 Marybelle Killings, MD [facility]   IR GENERIC HISTORICAL  03/04/2016   IR FLUORO GUIDE CV LINE RIGHT 03/04/2016 Marybelle Killings, MD [facility]   JOINT REPLACEMENT     bilat. knees   PERMANENT PACEMAKER INSERTION N/A 08/13/2014   STJ dual chamber pacemaker implanted by [name] for CHB   TONSILLECTOMY     TOTAL KNEE ARTHROPLASTY     bilateral   Family History  Problem Relation Age of Onset   Heart disease Mother        Before age 29   Diabetes Mother    Varicose Veins Mother    Heart attack Mother    Heart attack Father    Heart disease Father        After age 2   Hypertension Father    Heart disease Brother        Before age 72   Hypertension Brother    Heart attack Brother    Diabetes Son    Hypertension Son    Hypertension Son    Diabetes Son    Diabetes Other    Cancer Other     Colon cancer Neg Hx    Social History   Socioeconomic History   Marital status: Married    Spouse name: Not on file   Number of children: Not on file   Years of education: Not on file   Highest education level: Not on file  Occupational History   Occupation: retired    Fish farm manager: RETIRED  Tobacco Use   Smoking status: Former    Packs/day: 1.00  Years: 25.00    Pack years: 25.00    Types: Cigarettes    Quit date: 01/21/1973    Years since quitting: 47.9   Smokeless tobacco: Never   Tobacco comments:    began smoking in high school, quit age 21  Vaping Use   Vaping Use: Never used  Substance and Sexual Activity   Alcohol use: Yes    Alcohol/week: 0.0 standard drinks    Comment: wine occ   Drug use: No   Sexual activity: Never  Other Topics Concern   Not on file  Social History Narrative   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  Remarried 1998.     3 sons   Social Determinants of Radio broadcast assistant Strain: Low Risk    Difficulty of Paying Living Expenses: Not hard at all  Food Insecurity: No Food Insecurity   Worried About Charity fundraiser in the Last Year: Never true   Arboriculturist in the Last Year: Never true  Transportation Needs: No Transportation Needs   Lack of Transportation (Medical): No   Lack of Transportation (Non-Medical): No  Physical Activity: Inactive   Days of Exercise per Week: 0 days   Minutes of Exercise per Session: 0 min  Stress: No Stress Concern Present   Feeling of Stress : Not at all  Social Connections: Not on file    Tobacco Counseling Counseling given: Not Answered Tobacco comments: began smoking in high school, quit age 66   Clinical Intake:  Pre-visit preparation completed: Yes  Pain : No/denies pain     Nutritional Risks: None Diabetes: Yes CBG done?: No Did pt. bring in CBG monitor from home?: No  How often do you need to have someone help you when you read  instructions, pamphlets, or other written materials from your doctor or pharmacy?: 1 - Never  Diabetic: Yes Nutrition Risk Assessment:  Has the patient had any N/V/D within the last 2 months?  No  Does the patient have any non-healing wounds?  No  Has the patient had any unintentional weight loss or weight gain?  No   Diabetes:  Is the patient diabetic?  Yes  If diabetic, was a CBG obtained today?  No  telephone visit  Did the patient bring in their glucometer from home?  No telephone visit  How often do you monitor your CBG's? Every morning.   Financial Strains and Diabetes Management:  Are you having any financial strains with the device, your supplies or your medication? No .  Does the patient want to be seen by Chronic Care Management for management of their diabetes?  No  Would the patient like to be referred to a Nutritionist or for Diabetic Management?  No   Diabetic Exams:  Diabetic Eye Exam: Completed 05/13/2020 Diabetic Foot Exam: Completed 08/13/2020   Interpreter Needed?: No  Information entered by :: CJohnson, RN   Activities of Daily Living In your present state of health, do you have any difficulty performing the following activities: 12/12/2020  Hearing? N  Vision? N  Difficulty concentrating or making decisions? N  Walking or climbing stairs? N  Dressing or bathing? N  Doing errands, shopping? N  Preparing Food and eating ? N  Using the Toilet? N  In the past six months, have you accidently leaked urine? N  Do you have problems with loss of bowel control? N  Managing your Medications? N  Managing your Finances? N  Housekeeping or managing your Housekeeping? N  Some recent data might be hidden    Patient Care Team: Tonia Ghent, MD as PCP - General (Family Medicine) Dorothy Spark, MD (Inactive) as PCP - Cardiology (Cardiology) Evans Lance, MD as PCP - Electrophysiology (Cardiology) Murlean Iba, MD (Internal Medicine) Katy Apo,  MD as Consulting Physician (Ophthalmology) Dorothy Spark, MD (Inactive) as Consulting Physician (Cardiology) Eula Listen, DDS as Referring Physician (Dentistry) Danella Sensing, MD as Consulting Physician (Dermatology) Rod Can, MD as Consulting Physician (Orthopedic Surgery) Evans Lance, MD as Consulting Physician (Cardiology) Michel Bickers, MD as Consulting Physician (Infectious Diseases)  Indicate any recent Medical Services you may have received from other than [facility] providers in the past year (date may be approximate).     Assessment:   This is a routine wellness examination for Dean Murphy.  Hearing/Vision screen Vision Screening - Comments:: Patient gets annual eye exams   Dietary issues and exercise activities discussed: Current Exercise Habits: The patient does not participate in regular exercise at present, Exercise limited by: None identified   Goals Addressed             This Visit's Progress    Patient Stated       12/12/2020, I will maintain and continue medications as prescribed.        Depression Screen PHQ 2/9 Scores 12/12/2020 08/13/2020 07/12/2019 06/28/2019 07/07/2018 06/10/2017 12/08/2016  PHQ - 2 Score 0 0 0 0 0 0 0  PHQ- 9 Score 0 - 0 - 0 - -    Fall Risk Fall Risk  12/12/2020 08/13/2020 07/12/2019 06/28/2019 07/07/2018  Falls in the past year? 1 0 1 1 0  Comment - - tripped and fell - -  Number falls in past yr: 0 0 0 1 -  Injury with Fall? 1 0 1 1 -  Comment - - broke 2 ribs - -  Risk for fall due to : History of fall(s);Impaired balance/gait;Medication side effect History of fall(s) Medication side effect History of fall(s) -  Follow up Falls evaluation completed;Falls prevention discussed Falls evaluation completed Falls evaluation completed;Falls prevention discussed Falls evaluation completed -    FALL RISK PREVENTION PERTAINING TO THE HOME:  Any stairs in or around the home? Yes  If so, are there any without handrails? No  Home free of  loose throw rugs in walkways, pet beds, electrical cords, etc? Yes  Adequate lighting in your home to reduce risk of falls? Yes   ASSISTIVE DEVICES UTILIZED TO PREVENT FALLS:  Life alert? No  Use of a cane, walker or w/c? Yes  Grab bars in the bathroom? No  Shower chair or bench in shower? Yes  Elevated toilet seat or a handicapped toilet? Yes   TIMED UP AND GO:  Was the test performed?  N/A telephone visit .    Cognitive Function: MMSE - Mini Mental State Exam 12/12/2020 07/12/2019 07/07/2018 06/04/2016  Orientation to time 5 4 5 5   Orientation to Place 5 5 5 5   Registration 3 3 3 3   Attention/ Calculation 5 0 0 0  Recall 3 3 3 3   Language- name 2 objects - - 0 0  Language- repeat 1 1 1 1   Language- follow 3 step command - - 3 3  Language- read & follow direction - - 0 0  Write a sentence - - 0 0  Copy design - - 0 0  Total score - - 20 20  Mini Cog  Mini-Cog screen was completed. Maximum score is 22. A value of 0 denotes this part of the MMSE was not completed or the patient failed this part of the Mini-Cog screening.       Immunizations Immunization History  Administered Date(s) Administered   Fluad Quad(high Dose 65+) 01/20/2019   Influenza Split 02/03/2012   Influenza Whole 03/16/2007, 01/23/2010   Influenza, High Dose Seasonal PF 03/27/2013, 02/17/2018, 03/08/2020   Influenza,inj,Quad PF,6+ Mos 03/15/2014, 03/15/2015, 02/27/2016   Influenza-Unspecified 03/19/2017, 02/17/2018   Moderna SARS-COV2 Booster Vaccination 04/26/2020   Moderna Sars-Covid-2 Vaccination 06/07/2019, 07/05/2019   Pneumococcal Conjugate-13 06/24/2015   Pneumococcal Polysaccharide-23 09/24/2009   Td 07/26/2008    TDAP status: Due, Education has been provided regarding the importance of this vaccine. Advised may receive this vaccine at local pharmacy or Health Dept. Aware to provide a copy of the vaccination record if obtained from local pharmacy or Health Dept. Verbalized acceptance and  understanding.  Flu Vaccine status: Up to date  Pneumococcal vaccine status: Up to date  Covid-19 vaccine status: Completed 3 vaccines  Qualifies for Shingles Vaccine? Yes   Zostavax completed No   Shingrix Completed?: No.    Education has been provided regarding the importance of this vaccine. Patient has been advised to call insurance company to determine out of pocket expense if they have not yet received this vaccine. Advised may also receive vaccine at local pharmacy or Health Dept. Verbalized acceptance and understanding.  Screening Tests Health Maintenance  Topic Date Due   Zoster Vaccines- Shingrix (1 of 2) Never done   COVID-19 Vaccine (4 - Booster for Moderna series) 08/25/2020   TETANUS/TDAP  12/12/2021 (Originally 07/27/2018)   INFLUENZA VACCINE  12/23/2020   HEMOGLOBIN A1C  02/13/2021   OPHTHALMOLOGY EXAM  05/13/2021   FOOT EXAM  08/13/2021   PNA vac Low Risk Adult  Completed   HPV VACCINES  Aged Out    Health Maintenance  Health Maintenance Due  Topic Date Due   Zoster Vaccines- Shingrix (1 of 2) Never done   COVID-19 Vaccine (4 - Booster for Moderna series) 08/25/2020    Colorectal cancer screening: No longer required.   Lung Cancer Screening: (Low Dose CT Chest recommended if Age 59-80 years, 30 pack-year currently smoking OR have quit w/in 15years.) does not qualify.   Additional Screening:  Hepatitis C Screening: does not qualify; Completed N/A  Vision Screening: Recommended annual ophthalmology exams for early detection of glaucoma and other disorders of the eye. Is the patient up to date with their annual eye exam?  Yes  Who is the provider or what is the name of the office in which the patient attends annual eye exams? Dr. Prudencio Burly If pt is not established with a provider, would they like to be referred to a provider to establish care? No .   Dental Screening: Recommended annual dental exams for proper oral hygiene  Community Resource Referral /  Chronic Care Management: CRR required this visit?  No   CCM required this visit?  No      Plan:     I have personally reviewed and noted the following in the patient's chart:   Medical and social history Use of alcohol, tobacco or illicit drugs  Current medications and supplements including opioid prescriptions. Patient is not currently taking opioid prescriptions. Functional ability and status Nutritional status Physical activity Advanced directives List of other physicians Hospitalizations, surgeries, and ER visits in previous 12 months Vitals Screenings to include cognitive,  depression, and falls Referrals and appointments  In addition, I have reviewed and discussed with patient certain preventive protocols, quality metrics, and best practice recommendations. A written personalized care plan for preventive services as well as general preventive health recommendations were provided to patient.   Due to this being a telephonic visit, the after visit summary with patients personalized plan was offered to patient via office or my-chart. Patient preferred to pick up at office at next visit or via mychart.    Andrez Grime, LPN   09/15/5359

## 2020-12-13 ENCOUNTER — Ambulatory Visit: Payer: Medicare Other | Admitting: Family Medicine

## 2020-12-17 ENCOUNTER — Ambulatory Visit: Payer: Medicare Other | Admitting: Family Medicine

## 2020-12-17 ENCOUNTER — Other Ambulatory Visit: Payer: Self-pay

## 2020-12-17 ENCOUNTER — Encounter: Payer: Self-pay | Admitting: Family Medicine

## 2020-12-17 VITALS — BP 130/66 | HR 71 | Temp 97.2°F | Ht 69.0 in | Wt 200.0 lb

## 2020-12-17 DIAGNOSIS — M109 Gout, unspecified: Secondary | ICD-10-CM

## 2020-12-17 DIAGNOSIS — I5032 Chronic diastolic (congestive) heart failure: Secondary | ICD-10-CM

## 2020-12-17 DIAGNOSIS — E1122 Type 2 diabetes mellitus with diabetic chronic kidney disease: Secondary | ICD-10-CM | POA: Diagnosis not present

## 2020-12-17 DIAGNOSIS — Z7189 Other specified counseling: Secondary | ICD-10-CM

## 2020-12-17 DIAGNOSIS — D508 Other iron deficiency anemias: Secondary | ICD-10-CM

## 2020-12-17 DIAGNOSIS — N183 Chronic kidney disease, stage 3 unspecified: Secondary | ICD-10-CM | POA: Diagnosis not present

## 2020-12-17 DIAGNOSIS — T8454XD Infection and inflammatory reaction due to internal left knee prosthesis, subsequent encounter: Secondary | ICD-10-CM

## 2020-12-17 LAB — CBC WITH DIFFERENTIAL/PLATELET
Basophils Absolute: 0.1 10*3/uL (ref 0.0–0.1)
Basophils Relative: 0.9 % (ref 0.0–3.0)
Eosinophils Absolute: 0.2 10*3/uL (ref 0.0–0.7)
Eosinophils Relative: 2.2 % (ref 0.0–5.0)
HCT: 40 % (ref 39.0–52.0)
Hemoglobin: 13.5 g/dL (ref 13.0–17.0)
Lymphocytes Relative: 27 % (ref 12.0–46.0)
Lymphs Abs: 2.3 10*3/uL (ref 0.7–4.0)
MCHC: 33.6 g/dL (ref 30.0–36.0)
MCV: 92.1 fl (ref 78.0–100.0)
Monocytes Absolute: 0.6 10*3/uL (ref 0.1–1.0)
Monocytes Relative: 7.2 % (ref 3.0–12.0)
Neutro Abs: 5.4 10*3/uL (ref 1.4–7.7)
Neutrophils Relative %: 62.7 % (ref 43.0–77.0)
Platelets: 139 10*3/uL — ABNORMAL LOW (ref 150.0–400.0)
RBC: 4.34 Mil/uL (ref 4.22–5.81)
RDW: 13 % (ref 11.5–15.5)
WBC: 8.6 10*3/uL (ref 4.0–10.5)

## 2020-12-17 LAB — COMPREHENSIVE METABOLIC PANEL
ALT: 20 U/L (ref 0–53)
AST: 24 U/L (ref 0–37)
Albumin: 4.2 g/dL (ref 3.5–5.2)
Alkaline Phosphatase: 96 U/L (ref 39–117)
BUN: 37 mg/dL — ABNORMAL HIGH (ref 6–23)
CO2: 29 mEq/L (ref 19–32)
Calcium: 9.6 mg/dL (ref 8.4–10.5)
Chloride: 107 mEq/L (ref 96–112)
Creatinine, Ser: 2.59 mg/dL — ABNORMAL HIGH (ref 0.40–1.50)
GFR: 20.97 mL/min — ABNORMAL LOW (ref >60.00)
Glucose, Bld: 169 mg/dL — ABNORMAL HIGH (ref 70–99)
Potassium: 5.2 mEq/L — ABNORMAL HIGH (ref 3.5–5.1)
Sodium: 144 mEq/L (ref 135–145)
Total Bilirubin: 0.6 mg/dL (ref 0.2–1.2)
Total Protein: 6.5 g/dL (ref 6.0–8.3)

## 2020-12-17 LAB — URIC ACID: Uric Acid, Serum: 7.5 mg/dL (ref 4.0–7.8)

## 2020-12-17 LAB — POCT GLYCOSYLATED HEMOGLOBIN (HGB A1C): Hemoglobin A1C: 7.9 % — AB (ref 4.0–5.6)

## 2020-12-17 LAB — TSH: TSH: 2.16 u[IU]/mL (ref 0.35–5.50)

## 2020-12-17 LAB — LIPID PANEL
Cholesterol: 148 mg/dL (ref 0–200)
HDL: 43 mg/dL (ref >39.00)
LDL Cholesterol: 73 mg/dL (ref 0–99)
NonHDL: 105.14
Total CHOL/HDL Ratio: 3
Triglycerides: 160 mg/dL — ABNORMAL HIGH (ref 0.0–149.0)
VLDL: 32 mg/dL (ref 0.0–40.0)

## 2020-12-17 LAB — IRON: Iron: 89 ug/dL (ref 42–165)

## 2020-12-17 MED ORDER — FERROUS SULFATE 325 (65 FE) MG PO TABS: ORAL_TABLET | ORAL | Status: DC

## 2020-12-17 NOTE — Progress Notes (Signed)
This visit occurred during the SARS-CoV-2 public health emergency.  Safety protocols were in place, including screening questions prior to the visit, additional usage of staff PPE, and extensive cleaning of exam room while observing appropriate contact time as indicated for disinfecting solutions.  Diabetes:  Using medications without difficulties: yes Hypoglycemic episodes:no Hyperglycemic episodes:no Feet problems: R 1st nail trimmed short but o/w doing well.  eye exam within last year:yes  L knee is doing well with concurrent amoxil use, ongoing.  No fever, no chills, no knee pain.   D/w pt about seeing cardiology for followup, see AVS.  He agrees.  He is taking torsemide daily.  No CP.  Not SOB except age expected. BLE edema variable, improved with torsemide.    He had black stools with iron dosed 2x/week.  Stools normalized with once weekly dosing.  Recheck labs pending.   Wife designated if patient were incapacitated.   Vaccines d/w pt.  Shingrix may be cheaper at the pharmacy.    PMH and SH reviewed  Meds, vitals, and allergies reviewed.   ROS: Per HPI unless specifically indicated in ROS section   GEN: nad, alert and oriented HEENT: ncat NECK: supple w/o LA CV: rrr. PULM: ctab, no inc wob ABD: soft, +bs EXT: no edema SKIN: no acute rash  Diabetic foot exam: Normal inspection No skin breakdown No calluses  Normal DP pulses Normal sensation to light touch and monofilament Nails normal except for R 1st nail slightly trimmed short, d/w pt.

## 2020-12-17 NOTE — Patient Instructions (Addendum)
Please call about seeing Dr. Johney Frame.  [address], [place], [place]  [address] Phone: [phone]  Go to the lab on the way out.   If you have mychart we'll likely use that to update you.    Take care.  Glad to see you. Check with your insurance to see if they will cover the shingrix shot.  Plan on recheck in 6 months.  We can do labs at the visit.   Update me as needed.

## 2020-12-18 LAB — PARATHYROID HORMONE, INTACT (NO CA): PTH: 79 pg/mL — ABNORMAL HIGH (ref 16–77)

## 2020-12-18 NOTE — Assessment & Plan Note (Signed)
Recheck Cr pending, will update renal clinic.

## 2020-12-18 NOTE — Assessment & Plan Note (Signed)
A1c improved.  Goal A1c around 8.  Would continue glipizide insulin and Januvia as is.  See notes on labs.

## 2020-12-18 NOTE — Assessment & Plan Note (Signed)
  He had black stools with iron dosed 2x/week.  Stools normalized with once weekly dosing.  Recheck labs pending.

## 2020-12-18 NOTE — Assessment & Plan Note (Signed)
Doing well overall. D/w pt about seeing cardiology for followup, see AVS.  He agrees.  He is taking torsemide daily.  No CP.  Not SOB except age expected. BLE edema variable, improved with torsemide.  He'll call about follow up.  See avs.

## 2020-12-18 NOTE — Assessment & Plan Note (Signed)
Wife designated if patient were incapacitated.  

## 2020-12-18 NOTE — Assessment & Plan Note (Signed)
Would continue amoxicillin as is.  Doing well.

## 2021-01-19 ENCOUNTER — Other Ambulatory Visit: Payer: Self-pay | Admitting: Family Medicine

## 2021-01-28 ENCOUNTER — Ambulatory Visit (INDEPENDENT_AMBULATORY_CARE_PROVIDER_SITE_OTHER): Payer: Medicare Other

## 2021-01-28 DIAGNOSIS — I442 Atrioventricular block, complete: Secondary | ICD-10-CM | POA: Diagnosis not present

## 2021-01-28 LAB — CUP PACEART REMOTE DEVICE CHECK
Battery Remaining Longevity: 34 mo
Battery Remaining Percentage: 36 %
Battery Voltage: 2.98 V
Brady Statistic AP VP Percent: 5.2 %
Brady Statistic AP VS Percent: 1 %
Brady Statistic AS VP Percent: 95 %
Brady Statistic AS VS Percent: 1 %
Brady Statistic RA Percent Paced: 5 %
Brady Statistic RV Percent Paced: 99 %
Date Time Interrogation Session: 20220906020015
Implantable Lead Implant Date: 20160321
Implantable Lead Implant Date: 20160321
Implantable Lead Location: 753859
Implantable Lead Location: 753860
Implantable Pulse Generator Implant Date: 20160321
Lead Channel Impedance Value: 410 Ohm
Lead Channel Impedance Value: 650 Ohm
Lead Channel Pacing Threshold Amplitude: 0.5 V
Lead Channel Pacing Threshold Amplitude: 1.5 V
Lead Channel Pacing Threshold Pulse Width: 0.5 ms
Lead Channel Pacing Threshold Pulse Width: 0.8 ms
Lead Channel Sensing Intrinsic Amplitude: 11.8 mV
Lead Channel Sensing Intrinsic Amplitude: 4.9 mV
Lead Channel Setting Pacing Amplitude: 2.5 V
Lead Channel Setting Pacing Amplitude: 3 V
Lead Channel Setting Pacing Pulse Width: 0.5 ms
Lead Channel Setting Sensing Sensitivity: 8 mV
Pulse Gen Model: 2240
Pulse Gen Serial Number: 7734710

## 2021-02-05 NOTE — Progress Notes (Signed)
Remote pacemaker transmission.   

## 2021-04-02 ENCOUNTER — Other Ambulatory Visit: Payer: Self-pay | Admitting: Internal Medicine

## 2021-04-02 ENCOUNTER — Other Ambulatory Visit: Payer: Self-pay | Admitting: Family Medicine

## 2021-04-02 DIAGNOSIS — I480 Paroxysmal atrial fibrillation: Secondary | ICD-10-CM

## 2021-04-03 NOTE — Telephone Encounter (Signed)
Eliquis 2.5mg  refill request received. Patient is 87 years old, weight-90.7kg, Crea-2.59 on 12/17/2020, Diagnosis-Afib, and last seen by [name], NP on 08/09/2020. Dose is appropriate based on dosing criteria. Will send in refill to requested pharmacy.

## 2021-04-19 NOTE — Progress Notes (Signed)
Cardiology Office Note:    Date:  04/29/2021   ID:  Dean Murphy, DOB 03/08/1927, MRN [id number]  PCP:  Dean Ghent, MD   [facility] Providers Cardiologist:  Dean Dawley, MD (Inactive) Electrophysiologist:  Dean Peru, MD {    Referring MD: Dean Ghent, MD    History of Present Illness:    Dean Murphy is a 87 y.o. male with a hx of CAD s/p CABG in 1989, CHB s/p PPM placement, carotid artery disease s/p CEA, prior CVA, pAfib on apixaba, HLD, CKD III-IV, DMII, and HFpEF who was previously followed by Dr. Meda Murphy who now returns to clinic for follow-up.  Last saw Dean Murphy in 08/09/20 where he was doing well from a CV standpoint.  Today, the patient states that he is doing overall well. Has occasional chest discomfort over the past year that resolved with taking aspirin. He is otherwise able to mow the lawn and rake the yard without issue. Occasional LLE ankle swelling that is chronic. No lightheadedness, dizziness or syncope. Tolerating his medications without issues. Notably has been out of his diltiazem for 3 weeks with blood pressures well controlled off the medications; HR 89. Asking if he needs to resume it.  Past Medical History:  Diagnosis Date   Anemia    Arthritis    CAD (coronary artery disease)    a. CABG 1989, b. Myoview low risk 2012.   Carotid artery occlusion    a. Duplex 10/2014: patent R/L CEA with mild hyperplasia in right surgical bulb - followed by vascular.   CHB (complete heart block) [facility]) March 2016   a. s/p STJ dual chamber pacemaker 07/2014.   Chronic diastolic CHF (congestive heart failure) ([place])    a. Dx 01/4173 - acute diastolic CHF in the setting of CHB.   Chronic kidney disease, stage IV (severe) (HCC)    stage III/IV as of 2015, Dr Dean Murphy Nephologist   Colon polyps    Complication of anesthesia    Constipation    CVA (cerebral infarction)    Diabetes mellitus    type II   Diverticulosis    Dupuytren's disease    finger  right hand contracted   Essential hypertension    GERD (gastroesophageal reflux disease)    Hemorrhoids    Hyperlipidemia    Myocardial infarction ([place]) 1976  and Mar. 19, 2016   Orthostasis    Osteoporosis    PAF (paroxysmal atrial fibrillation) ([place]) March 2016   PONV (postoperative nausea and vomiting)    Presence of permanent cardiac pacemaker    Shortness of breath dyspnea    due to pain    Past Surgical History:  Procedure Laterality Date   CARDIAC CATHETERIZATION  08/11/2014   Procedure: TEMPORARY PACEMAKER;  Surgeon: Dean Harp, MD;  Location: [facility] CATH LAB;  Service: Cardiovascular;;   CAROTID ENDARTERECTOMY  12/31/10   Right   CAROTID ENDARTERECTOMY  02/09/11   left   CHOLECYSTECTOMY     CORONARY ARTERY BYPASS GRAFT  1989   EYE SURGERY Bilateral    Cataract with implants   I & D KNEE WITH POLY EXCHANGE Left 07/29/2015   Procedure: IRRIGATION AND DEBRIDEMENT LEFT KNEE WITH POLY EXCHANGE;  Surgeon: Dean Can, MD;  Location: [place] [facility];  Service: Orthopedics;  Laterality: Left;   IR GENERIC HISTORICAL  03/04/2016   IR US GUIDE VASC ACCESS RIGHT 03/04/2016 Dean Killings, MD [facility]INTER[facility]   IR GENERIC HISTORICAL  03/04/2016   IR FLUORO GUIDE  CV LINE RIGHT 03/04/2016 Dean Killings, MD [facility]   JOINT REPLACEMENT     bilat. knees   PERMANENT PACEMAKER INSERTION N/A 08/13/2014   STJ dual chamber pacemaker implanted by Dr Dean Murphy for CHB   TONSILLECTOMY     TOTAL KNEE ARTHROPLASTY     bilateral    Current Medications: Current Meds  Medication Sig   amoxicillin (AMOXIL) 500 MG capsule Take 1 capsule (500 mg total) by mouth 2 (two) times daily.   B-D UF III MINI PEN NEEDLES 31G X 5 MM MISC USE DAILY WITH INSULIN PEN   clotrimazole-betamethasone (LOTRISONE) cream APPLY TO AFFECTED AREA TWICE A DAY   ELIQUIS 2.5 MG TABS tablet TAKE 1 TABLET BY MOUTH TWICE A DAY   ferrous sulfate 325 (65 FE) MG tablet TAKE 1 TABLET BY MOUTH ON FRIDAY   glipiZIDE (GLUCOTROL) 5 MG tablet  TAKE 2 TABLETS BY MOUTH EVERY MORNING BEFORE BREAKFAST AND 1 TABLET BEFORE SUPPER   insulin glargine (LANTUS SOLOSTAR) 100 UNIT/ML Solostar Pen INJECT 30 UNITS INTO THE SKIN AT BEDTIME   JANUVIA 50 MG tablet TAKE 1 TABLET BY MOUTH EVERY DAY   losartan (COZAAR) 100 MG tablet Take 1 tablet (100 mg total) by mouth daily.   lubiprostone (AMITIZA) 8 MCG capsule TAKE ONE CAPSULE BY MOUTH EACH MORNING   metoprolol succinate (TOPROL XL) 25 MG 24 hr tablet Take 1 tablet (25 mg total) by mouth at bedtime.   omeprazole (PRILOSEC) 40 MG capsule TAKE 1 CAPSULE BY MOUTH EVERY DAY   rosuvastatin (CRESTOR) 20 MG tablet Take 1 tablet (20 mg total) by mouth daily.   torsemide (DEMADEX) 10 MG tablet Take 1 tablet (10 mg total) by mouth daily as needed (for swelling).   [DISCONTINUED] diltiazem (CARDIZEM CD) 360 MG 24 hr capsule Take 1 capsule (360 mg total) by mouth daily. Please make appt with Dr. Johney Murphy (Cardiologist) for September 2022 for future refills. Thank you 1st attempt     Allergies:   Zoledronic acid and Nsaids   Social History   Socioeconomic History   Marital status: Married    Spouse name: Not on file   Number of children: Not on file   Years of education: Not on file   Highest education level: Not on file  Occupational History   Occupation: retired    Fish farm manager: RETIRED  Tobacco Use   Smoking status: Former    Packs/day: 1.00    Years: 25.00    Pack years: 25.00    Types: Cigarettes    Quit date: 01/21/1973    Years since quitting: 48.3   Smokeless tobacco: Never   Tobacco comments:    began smoking in high school, quit age 70  Vaping Use   Vaping Use: Never used  Substance and Sexual Activity   Alcohol use: Yes    Alcohol/week: 0.0 standard drinks    Comment: wine occ   Drug use: No   Sexual activity: Never  Other Topics Concern   Not on file  Social History Narrative   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  Remarried 1998.     3  sons   Social Determinants of Radio broadcast assistant Strain: Low Risk    Difficulty of Paying Living Expenses: Not hard at all  Food Insecurity: No Food Insecurity   Worried About Charity fundraiser in the Last Year: Never true   [place] in the  Last Year: Never true  Transportation Needs: No Transportation Needs   Lack of Transportation (Medical): No   Lack of Transportation (Non-Medical): No  Physical Activity: Inactive   Days of Exercise per Week: 0 days   Minutes of Exercise per Session: 0 min  Stress: No Stress Concern Present   Feeling of Stress : Not at all  Social Connections: Not on file     Family History: The patient's family history includes Cancer in an other family member; Diabetes in his mother, son, son, and another family member; Heart attack in his brother, father, and mother; Heart disease in his brother, father, and mother; Hypertension in his brother, father, son, and son; Varicose Veins in his mother. There is no history of Colon cancer.  ROS:   Please see the history of present illness.     Review of Systems  Constitutional:  Negative for chills and fever.  Eyes:  Negative for blurred vision.  Respiratory:  Negative for shortness of breath.   Cardiovascular:  Positive for chest pain. Negative for palpitations, orthopnea, claudication, leg swelling and PND.  Gastrointestinal:  Negative for nausea and vomiting.  Genitourinary:  Negative for flank pain.  Musculoskeletal:  Negative for falls.  Neurological:  Negative for dizziness and loss of consciousness.    EKGs/Labs/Other Studies Reviewed:    The following studies were reviewed today: 2D echo 08/2017 Study Conclusions   - Left ventricle: The cavity size was normal. There was moderate    concentric hypertrophy. Systolic function was normal. The    estimated ejection fraction was in the range of 55% to 60%.    Hypokinesis of the apical septal myocardium. Doppler parameters    are  consistent with abnormal left ventricular relaxation (grade 1    diastolic dysfunction). Doppler parameters are consistent with    indetermiante ventricular filling pressure.  - Aortic valve: Transvalvular velocity was within the normal range.    There was no stenosis. There was no regurgitation.  - Mitral valve: Transvalvular velocity was within the normal range.    There was no evidence for stenosis. There was mild regurgitation.  - Left atrium: The atrium was severely dilated.  - Right ventricle: The cavity size was normal. Wall thickness was    normal. Systolic function was normal.  - Atrial septum: No defect or patent foramen ovale was identified    by color flow Doppler.  - Tricuspid valve: There was trivial regurgitation.  - Pulmonary arteries: Systolic pressure was mildly increased. PA    peak pressure: 41 mm Hg (S).   EKG:  EKG 07/2020: NSR, V-pacing   Recent Labs: 12/17/2020: ALT 20; BUN 37; Creatinine, Ser 2.59; Hemoglobin 13.5; Platelets 139.0; Potassium 5.2; Sodium 144; TSH 2.16  Recent Lipid Panel    Component Value Date/Time   CHOL 148 12/17/2020 1149   TRIG 160.0 (H) 12/17/2020 1149   TRIG 62 04/28/2006 0917   HDL 43.00 12/17/2020 1149   CHOLHDL 3 12/17/2020 1149   VLDL 32.0 12/17/2020 1149   LDLCALC 73 12/17/2020 1149   LDLCALC 71 04/12/2020 1705   LDLDIRECT 135.1 03/12/2014 0812     Risk Assessment/Calculations:    CHA2DS2-VASc Score = 5  This indicates a 7.2% annual risk of stroke. The patient's score is based upon: CHF History: 1 HTN History: 1 Diabetes History: 0 Stroke History: 0 Vascular Disease History: 1 Age Score: 2 Gender Score: 0         Physical Exam:    VS:  BP 128/70  Pulse 89   Ht 5\' 9"  (1.753 m)   Wt 198 lb (89.8 kg)   SpO2 97%   BMI 29.24 kg/m     Wt Readings from Last 3 Encounters:  04/29/21 198 lb (89.8 kg)  12/17/20 200 lb (90.7 kg)  08/13/20 193 lb (87.5 kg)     GEN: Elderly, very well appearing, NAD HEENT:  Normal NECK: No JVD; No carotid bruits CARDIAC: RRR, soft systolic murmur. No rubs or gallops RESPIRATORY:  Clear to auscultation without rales, wheezing or rhonchi  ABDOMEN: Soft, non-tender, non-distended MUSCULOSKELETAL:  No edema; No deformity  SKIN: Warm and dry NEUROLOGIC:  Alert and oriented x 3 PSYCHIATRIC:  Normal affect   ASSESSMENT:    1. Coronary artery disease involving coronary bypass graft of native heart without angina pectoris   2. Complete heart block (HCC)   3. Paroxysmal atrial fibrillation ([place])   4. S/P CABG x 5 1989. Low risk Myoview 2012   5. Hypercholesterolemia   6. Chronic diastolic CHF (congestive heart failure) ([place])   7. Hyperlipidemia, unspecified hyperlipidemia type   8. Essential hypertension    PLAN:    In order of problems listed above:  #CAD s/p CABG in 1989: Doing very well with minimal anginal symptoms. Able to mow his yard, rake leaves and remain active without issues. Has very rare episodes of chest pressure that resolves with taking 2 aspirin (2-3 episodes over the last year) -Continue crestor 20mg  daily -Not on ASA due to need for apixaban -Continue losartan 100mg  daily -Change diltiazem to metoprolol 25mg  XL daily (has been off dilt for a month and blood pressures controlled with HR 89; will start metop for now given history of CABG)  #HFpEF: LVEF 55-60% with G1DD. Currently euvolemic with NYHA class I symptoms. -Continue torsemide 10mg  daily -Continue losartan 100mg  daily -Start metoprolol 25mg  XL daily -Murphy consider farxiga/spiro in the future, however, given advanced age and desire to limit medications, will defer for now -Monitor daily weights  #Carotid Artery Disease s/p bilateral CEA: Stable on carotid ultrasound in 2019. No further screening needed unless bruit or symptoms. -Continue crestor 20mg  daily -Not on ASA due to need for apixaban  #CHB s/p PPM placement: Followed by Dr. Lovena Murphy. Doing very well. -Follow-up with  Dr. Lovena Murphy as scheduled  #pAfib: CHADs-vasc 5. Doing well with no palpitations. Tolerating apixaban without issues.  -Continue apixaban 2.5mg  BID -Change diltiazem to metoprolol 25mg  XL daily (has been off dilt for a month and blood pressures controlled with HR 89; will start metop for now given history of CABG)  #HLD: LDL 73. Ideally would be <50, however, given age and minimal additional benefit will continue current regimen. -Continue crestor 20mg  daily  #CKD III-IV: -Followed by Dr. Candiss Norse      Medication Adjustments/Labs and Tests Ordered: Current medicines are reviewed at length with the patient today.  Concerns regarding medicines are outlined above.  No orders of the defined types were placed in this encounter.  Meds ordered this encounter  Medications   metoprolol succinate (TOPROL XL) 25 MG 24 hr tablet    Sig: Take 1 tablet (25 mg total) by mouth at bedtime.    Dispense:  90 tablet    Refill:  3     Patient Instructions  Medication Instructions:   STOP TAKING DILTIAZEM NOW  START TAKING METOPROLOL SUCCINATE (TOPROL XL) 25 MG BY MOUTH DAILY AT BEDTIME.   *If you need a refill on your cardiac medications before your next appointment, please  call your pharmacy*  Follow-Up: At [facility], you and your health needs are our priority.  As part of our continuing mission to provide you with exceptional heart care, we have created designated Provider Care Teams.  These Care Teams include your primary Cardiologist (physician) and Advanced Practice Providers (APPs -  Physician Assistants and Nurse Practitioners) who all work together to provide you with the care you need, when you need it.  We recommend signing up for the patient portal called "MyChart".  Sign up information is provided on this After Visit Summary.  MyChart is used to connect with patients for Virtual Visits (Telemedicine).  Patients are able to view lab/test results, encounter notes, upcoming appointments,  etc.  Non-urgent messages Murphy be sent to your provider as well.   To learn more about what you can do with MyChart, go to [email or web address].    Your next appointment:   1 year(s)  The format for your next appointment:   In Person  Provider:    DR. Johney Murphy   Signed, Freada Bergeron, MD  04/29/2021 8:56 AM    [place]

## 2021-04-29 ENCOUNTER — Encounter: Payer: Self-pay | Admitting: Cardiology

## 2021-04-29 ENCOUNTER — Ambulatory Visit (INDEPENDENT_AMBULATORY_CARE_PROVIDER_SITE_OTHER): Payer: Medicare Other

## 2021-04-29 ENCOUNTER — Ambulatory Visit: Payer: Medicare Other | Admitting: Cardiology

## 2021-04-29 ENCOUNTER — Other Ambulatory Visit: Payer: Self-pay

## 2021-04-29 VITALS — BP 128/70 | HR 89 | Ht 69.0 in | Wt 198.0 lb

## 2021-04-29 DIAGNOSIS — Z951 Presence of aortocoronary bypass graft: Secondary | ICD-10-CM

## 2021-04-29 DIAGNOSIS — I480 Paroxysmal atrial fibrillation: Secondary | ICD-10-CM | POA: Diagnosis not present

## 2021-04-29 DIAGNOSIS — I5032 Chronic diastolic (congestive) heart failure: Secondary | ICD-10-CM

## 2021-04-29 DIAGNOSIS — E7800 Pure hypercholesterolemia, unspecified: Secondary | ICD-10-CM

## 2021-04-29 DIAGNOSIS — I25810 Atherosclerosis of coronary artery bypass graft(s) without angina pectoris: Secondary | ICD-10-CM

## 2021-04-29 DIAGNOSIS — E785 Hyperlipidemia, unspecified: Secondary | ICD-10-CM

## 2021-04-29 DIAGNOSIS — I442 Atrioventricular block, complete: Secondary | ICD-10-CM

## 2021-04-29 DIAGNOSIS — I10 Essential (primary) hypertension: Secondary | ICD-10-CM

## 2021-04-29 LAB — CUP PACEART REMOTE DEVICE CHECK
Battery Remaining Longevity: 31 mo
Battery Remaining Percentage: 33 %
Battery Voltage: 2.96 V
Brady Statistic AP VP Percent: 5.8 %
Brady Statistic AP VS Percent: 1 %
Brady Statistic AS VP Percent: 94 %
Brady Statistic AS VS Percent: 1 %
Brady Statistic RA Percent Paced: 5.5 %
Brady Statistic RV Percent Paced: 99 %
Date Time Interrogation Session: 20221206020014
Implantable Lead Implant Date: 20160321
Implantable Lead Implant Date: 20160321
Implantable Lead Location: 753859
Implantable Lead Location: 753860
Implantable Pulse Generator Implant Date: 20160321
Lead Channel Impedance Value: 410 Ohm
Lead Channel Impedance Value: 590 Ohm
Lead Channel Pacing Threshold Amplitude: 0.5 V
Lead Channel Pacing Threshold Amplitude: 1.5 V
Lead Channel Pacing Threshold Pulse Width: 0.5 ms
Lead Channel Pacing Threshold Pulse Width: 0.8 ms
Lead Channel Sensing Intrinsic Amplitude: 4.9 mV
Lead Channel Sensing Intrinsic Amplitude: 8.7 mV
Lead Channel Setting Pacing Amplitude: 2.5 V
Lead Channel Setting Pacing Amplitude: 3 V
Lead Channel Setting Pacing Pulse Width: 0.5 ms
Lead Channel Setting Sensing Sensitivity: 8 mV
Pulse Gen Model: 2240
Pulse Gen Serial Number: 7734710

## 2021-04-29 MED ORDER — METOPROLOL SUCCINATE ER 25 MG PO TB24: 25.0000 mg | ORAL_TABLET | Freq: Every day | ORAL | 3 refills | Status: DC

## 2021-04-29 NOTE — Patient Instructions (Signed)
Medication Instructions:   STOP TAKING DILTIAZEM NOW  START TAKING METOPROLOL SUCCINATE (TOPROL XL) 25 MG BY MOUTH DAILY AT BEDTIME.   *If you need a refill on your cardiac medications before your next appointment, please call your pharmacy*  Follow-Up: At [facility], you and your health needs are our priority.  As part of our continuing mission to provide you with exceptional heart care, we have created designated Provider Care Teams.  These Care Teams include your primary Cardiologist (physician) and Advanced Practice Providers (APPs -  Physician Assistants and Nurse Practitioners) who all work together to provide you with the care you need, when you need it.  We recommend signing up for the patient portal called "MyChart".  Sign up information is provided on this After Visit Summary.  MyChart is used to connect with patients for Virtual Visits (Telemedicine).  Patients are able to view lab/test results, encounter notes, upcoming appointments, etc.  Non-urgent messages can be sent to your provider as well.   To learn more about what you can do with MyChart, go to [email or web address].    Your next appointment:   1 year(s)  The format for your next appointment:   In Person  Provider:    DR. Johney Frame

## 2021-05-07 NOTE — Progress Notes (Signed)
Remote pacemaker transmission.   

## 2021-05-15 ENCOUNTER — Other Ambulatory Visit: Payer: Self-pay | Admitting: Family Medicine

## 2021-05-15 ENCOUNTER — Other Ambulatory Visit: Payer: Self-pay | Admitting: Internal Medicine

## 2021-05-15 DIAGNOSIS — T8454XD Infection and inflammatory reaction due to internal left knee prosthesis, subsequent encounter: Secondary | ICD-10-CM

## 2021-05-20 ENCOUNTER — Other Ambulatory Visit: Payer: Self-pay | Admitting: Family Medicine

## 2021-06-02 LAB — HM DIABETES EYE EXAM

## 2021-06-19 ENCOUNTER — Ambulatory Visit (INDEPENDENT_AMBULATORY_CARE_PROVIDER_SITE_OTHER): Payer: Medicare Other | Admitting: Family Medicine

## 2021-06-19 ENCOUNTER — Other Ambulatory Visit: Payer: Self-pay

## 2021-06-19 ENCOUNTER — Encounter: Payer: Self-pay | Admitting: Family Medicine

## 2021-06-19 VITALS — BP 140/80 | HR 67 | Temp 97.6°F | Ht 69.0 in | Wt 200.0 lb

## 2021-06-19 DIAGNOSIS — E119 Type 2 diabetes mellitus without complications: Secondary | ICD-10-CM | POA: Diagnosis not present

## 2021-06-19 DIAGNOSIS — E1122 Type 2 diabetes mellitus with diabetic chronic kidney disease: Secondary | ICD-10-CM

## 2021-06-19 DIAGNOSIS — D508 Other iron deficiency anemias: Secondary | ICD-10-CM | POA: Diagnosis not present

## 2021-06-19 DIAGNOSIS — N183 Chronic kidney disease, stage 3 unspecified: Secondary | ICD-10-CM

## 2021-06-19 DIAGNOSIS — T8454XD Infection and inflammatory reaction due to internal left knee prosthesis, subsequent encounter: Secondary | ICD-10-CM | POA: Diagnosis not present

## 2021-06-19 DIAGNOSIS — K219 Gastro-esophageal reflux disease without esophagitis: Secondary | ICD-10-CM

## 2021-06-19 LAB — IRON: Iron: 85 ug/dL (ref 42–165)

## 2021-06-19 LAB — HEMOGLOBIN A1C: Hgb A1c MFr Bld: 8.6 % — ABNORMAL HIGH (ref 4.6–6.5)

## 2021-06-19 MED ORDER — OMEPRAZOLE 40 MG PO CPDR: 40.0000 mg | DELAYED_RELEASE_CAPSULE | ORAL | Status: DC

## 2021-06-19 NOTE — Progress Notes (Signed)
This visit occurred during the SARS-CoV-2 public health emergency.  Safety protocols were in place, including screening questions prior to the visit, additional usage of staff PPE, and extensive cleaning of exam room while observing appropriate contact time as indicated for disinfecting solutions.  Diabetes:  Using medications without difficulties: yes Hypoglycemic episodes: no Hyperglycemic episodes: no Feet problems: some tinging in the L foot, if barefoot but not o/w.   Blood Sugars averaging: ~200.   eye exam within last year: he had seen Dr. Prudencio Burly recently.   Labs pending.   CKD per renal with recent improvement in Cr to 2.25, d/w pt.   No knee pain.  Still on amoxil. No fevers, swelling, bruising locally.    H/o low iron, on replacement once a week, f/u labs pending.  Recent HGB at goal.    He was able to cut omprazole to 40mg  MWF and is doing well.    Meds, vitals, and allergies reviewed.  ROS: Per HPI unless specifically indicated in ROS section   GEN: nad, alert and oriented HEENT: ncat NECK: supple w/o LA CV: rrr. PULM: ctab, no inc wob ABD: soft, +bs EXT: no edema SKIN: no acute rash

## 2021-06-19 NOTE — Patient Instructions (Addendum)
Please ask the front for a record release from Dr. Prudencio Burly.   Plan on recheck at a yearly visit in about 6 months at [facility].   Go to the lab on the way out.   If you have mychart we'll likely use that to update you.    Take care.  Glad to see you.

## 2021-06-22 NOTE — Assessment & Plan Note (Signed)
°  CKD per renal with recent improvement in Cr to 2.25, d/w pt. per nephrology.  I will defer.

## 2021-06-22 NOTE — Assessment & Plan Note (Signed)
Continue PPI on Monday Wednesday Friday as he has been doing.  Doing well.

## 2021-06-22 NOTE — Assessment & Plan Note (Signed)
History of, taking iron once a week.  See notes on labs.

## 2021-06-22 NOTE — Assessment & Plan Note (Signed)
No change in meds at this point.  Goal to avoid hypoglycemia.  Continue glipizide Lantus and Januvia.

## 2021-06-22 NOTE — Assessment & Plan Note (Signed)
Continue amoxicillin.  He will follow-up with infectious disease.  Doing well.

## 2021-07-09 ENCOUNTER — Other Ambulatory Visit: Payer: Self-pay | Admitting: Family Medicine

## 2021-07-17 ENCOUNTER — Other Ambulatory Visit: Payer: Self-pay

## 2021-07-17 ENCOUNTER — Ambulatory Visit: Payer: Medicare Other | Admitting: Internal Medicine

## 2021-07-17 DIAGNOSIS — R296 Repeated falls: Secondary | ICD-10-CM | POA: Diagnosis not present

## 2021-07-17 DIAGNOSIS — T8454XD Infection and inflammatory reaction due to internal left knee prosthesis, subsequent encounter: Secondary | ICD-10-CM

## 2021-07-17 MED ORDER — AMOXICILLIN 500 MG PO CAPS: 500.0000 mg | ORAL_CAPSULE | Freq: Two times a day (BID) | ORAL | 3 refills | Status: DC

## 2021-07-17 NOTE — Assessment & Plan Note (Signed)
He has had no evidence of relapse or persistent infection since restarting amoxicillin 6 years ago.  His inflammatory markers had normalized 1 year ago but they have been up and down over the last 6 years.  Unfortunately, there is only 1 way to know if his infection has been cured and that would be to stop amoxicillin again.  He is not interested in taking any chance of his infection coming back and chooses to continue amoxicillin for now.  He will follow-up in 1 year.

## 2021-07-17 NOTE — Progress Notes (Signed)
[facility] for Infectious Disease  Patient Active Problem List   Diagnosis Date Noted   Infection of prosthetic left knee joint ([place]) 07/29/2015    Priority: High   Recurrent falls 07/17/2021   Chronic diastolic heart failure ([place]) 08/05/2020   Fall at home 04/14/2020   Healthcare maintenance 07/21/2018   Advance care planning 07/21/2018   Constipation    Low back pain 02/02/2018   Coronary artery disease 07/29/2015   Stroke ([place]) 12/24/2014   Double vision 12/23/2014   Pacemaker 11/16/2014   Anemia 09/27/2014   Gastrointestinal hemorrhage associated with peptic ulcer 09/07/2014   Paroxysmal atrial fibrillation ([place]) 08/13/2014   Chest pain 08/11/2014   Complete heart block ([place]) 08/11/2014   S/P CABG x 5 1989. Low risk Myoview 2012 08/11/2014   Syncope 08/11/2014   Cough 06/14/2014   Left foot pain 02/23/2014   Gouty arthropathy 08/09/2013   Chronic radicular lumbar pain 12/15/2012   PVD- s/p bilat CEA- CA dopplers OK Jan 2015 10/06/2012   Insomnia 09/30/2011   OTHER SPECIFIED SITES OF SPRAINS AND STRAINS 04/25/2010   DIVERTICULITIS OF COLON 02/21/2010   TEMPOROMANDIBULAR JOINT DISORDER 11/22/2009   DM (diabetes mellitus), type 2 with renal complications ([place]) 29/92/4268   Chronic kidney disease, stage III (moderate) (HCC) 07/26/2008   CHRONIC PROSTATITIS 07/26/2008   ADVEF, DRUG/MEDICINAL/BIOLOGICAL SUBST NOS 03/16/2007   Dyslipidemia 11/15/2006   Essential hypertension 11/15/2006   GERD 11/15/2006   Osteoarthritis 11/15/2006    Patient's Medications  New Prescriptions   No medications on file  Previous Medications   B-D UF III MINI PEN NEEDLES 31G X 5 MM MISC    USE DAILY WITH INSULIN PEN   CLOTRIMAZOLE-BETAMETHASONE (LOTRISONE) CREAM    APPLY TO AFFECTED AREA TWICE A DAY   ELIQUIS 2.5 MG TABS TABLET    TAKE 1 TABLET BY MOUTH TWICE A DAY   FERROUS SULFATE 325 (65 FE) MG TABLET    TAKE 1 TABLET BY MOUTH ON FRIDAY   GLIPIZIDE (GLUCOTROL) 5 MG  TABLET    TAKE 2 TABLETS BY MOUTH EVERY MORNING BEFORE BREAKFAST AND 1 TABLET BEFORE SUPPER   JANUVIA 50 MG TABLET    TAKE 1 TABLET BY MOUTH EVERY DAY   LANTUS SOLOSTAR 100 UNIT/ML SOLOSTAR PEN    INJECT 30 UNITS INTO THE SKIN AT BEDTIME   LOSARTAN (COZAAR) 100 MG TABLET    Take 1 tablet (100 mg total) by mouth daily.   LUBIPROSTONE (AMITIZA) 8 MCG CAPSULE    TAKE ONE CAPSULE BY MOUTH EACH MORNING   METOPROLOL SUCCINATE (TOPROL XL) 25 MG 24 HR TABLET    Take 1 tablet (25 mg total) by mouth at bedtime.   OMEPRAZOLE (PRILOSEC) 40 MG CAPSULE    Take 1 capsule (40 mg total) by mouth every Monday, Wednesday, and Friday.   ROSUVASTATIN (CRESTOR) 20 MG TABLET    Take 1 tablet (20 mg total) by mouth daily.   TORSEMIDE (DEMADEX) 10 MG TABLET    Take 1 tablet (10 mg total) by mouth daily as needed (for swelling).  Modified Medications   Modified Medication Previous Medication   AMOXICILLIN (AMOXIL) 500 MG CAPSULE amoxicillin (AMOXIL) 500 MG capsule      Take 1 capsule (500 mg total) by mouth 2 (two) times daily.    TAKE 1 CAPSULE BY MOUTH TWICE A DAY  Discontinued Medications   No medications on file    Subjective: Dean Murphy is in for his routine follow-up  visit.  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  He completed 6 months of antibiotic therapy but had an early relapse of his infection and has been on amoxicillin ever since that time.  He has never had any problems tolerating amoxicillin.  He has had 2 recent falls.  One was about 1 month ago when he was walking out of [facility] department store.  He had a fall but did not have any injuries.  Yesterday he tripped while working in his garden and bruised his left fourth toe and the left side of his chest.  He was able to get up with the assistance of his wife.  Review of Systems: Review of Systems  Constitutional:  Negative for fever.  Cardiovascular:  Positive for chest pain.  Gastrointestinal:  Negative for abdominal  pain, diarrhea, nausea and vomiting.  Musculoskeletal:  Positive for joint pain.   Past Medical History:  Diagnosis Date   Anemia    Arthritis    CAD (coronary artery disease)    a. CABG 1989, b. Myoview low risk 2012.   Carotid artery occlusion    a. Duplex 10/2014: patent R/L CEA with mild hyperplasia in right surgical bulb - followed by vascular.   CHB (complete heart block) [facility]) March 2016   a. s/p STJ dual chamber pacemaker 07/2014.   Chronic diastolic CHF (congestive heart failure) ([place])    a. Dx 08/100 - acute diastolic CHF in the setting of CHB.   Chronic kidney disease, stage IV (severe) (HCC)    stage III/IV as of 2015, [name]   Colon polyps    Complication of anesthesia    Constipation    CVA (cerebral infarction)    Diabetes mellitus    type II   Diverticulosis    Dupuytren's disease    finger right hand contracted   Essential hypertension    GERD (gastroesophageal reflux disease)    Hemorrhoids    Hyperlipidemia    Myocardial infarction ([place]) 1976  and Mar. 19, 2016   Orthostasis    Osteoporosis    PAF (paroxysmal atrial fibrillation) ([place]) March 2016   PONV (postoperative nausea and vomiting)    Presence of permanent cardiac pacemaker    Shortness of breath dyspnea    due to pain    Social History   Tobacco Use   Smoking status: Former    Packs/day: 1.00    Years: 25.00    Pack years: 25.00    Types: Cigarettes    Quit date: 01/21/1973    Years since quitting: 48.5   Smokeless tobacco: Never   Tobacco comments:    began smoking in high school, quit age 89  Vaping Use   Vaping Use: Never used  Substance Use Topics   Alcohol use: Yes    Alcohol/week: 0.0 standard drinks    Comment: wine occ   Drug use: No    Family History  Problem Relation Age of Onset   Heart disease Mother        Before age 66   Diabetes Mother    Varicose Veins Mother    Heart attack Mother    Heart attack Father    Heart disease Father        After age  14   Hypertension Father    Heart disease Brother        Before age 61   Hypertension Brother    Heart attack Brother    Diabetes Son  Hypertension Son    Hypertension Son    Diabetes Son    Diabetes Other    Cancer Other    Colon cancer Neg Hx     Allergies  Allergen Reactions   Zoledronic Acid Other (See Comments)    Stopped 2015 due to Creatine  Other reaction(s): Unknown Stopped 2015 due to Cr   Nsaids Other (See Comments)    Held due to creatinine elevation.      Objective: Vitals:   07/17/21 0910  BP: (!) 149/72  Pulse: 70  Temp: (!) 96.7 F (35.9 C)  TempSrc: Temporal  SpO2: 95%  Weight: 196 lb (88.9 kg)   Body mass index is 28.94 kg/m.  Physical Exam Constitutional:      Comments: He is in his usual good spirits.  He is accompanied by his wife.  Cardiovascular:     Rate and Rhythm: Normal rate.  Pulmonary:     Effort: Pulmonary effort is normal.  Musculoskeletal:     Comments: His left knee incision remains fully healed.  There is no unusual swelling, warmth or redness of his left knee.  He has good range of motion.  Skin:    Findings: No rash.  Psychiatric:        Mood and Affect: Mood normal.    Lab Results    Problem List Items Addressed This Visit       High   Infection of prosthetic left knee joint ([place])    He has had no evidence of relapse or persistent infection since restarting amoxicillin 6 years ago.  His inflammatory markers had normalized 1 year ago but they have been up and down over the last 6 years.  Unfortunately, there is only 1 way to know if his infection has been cured and that would be to stop amoxicillin again.  He is not interested in taking any chance of his infection coming back and chooses to continue amoxicillin for now.  He will follow-up in 1 year.      Relevant Medications   amoxicillin (AMOXIL) 500 MG capsule     Unprioritized   Recurrent falls    He is amazingly spry for a 88 year old but he has been  having recurrent falls recently.  I talked to him about spending some time 2-3 times each week practicing getting up off of the floor.  His wife tells me that yesterday she had a very difficult time getting him up after he fell in the garden.        Dean Bickers, MD [facility] for [facility]ious [facility] [phone] pager   [phone] cell 07/17/2021, 9:33 AM

## 2021-07-17 NOTE — Assessment & Plan Note (Signed)
He is amazingly spry for a 88 year old but he has been having recurrent falls recently.  I talked to him about spending some time 2-3 times each week practicing getting up off of the floor.  His wife tells me that yesterday she had a very difficult time getting him up after he fell in the garden.

## 2021-07-23 ENCOUNTER — Other Ambulatory Visit: Payer: Self-pay | Admitting: Family Medicine

## 2021-07-29 ENCOUNTER — Ambulatory Visit (INDEPENDENT_AMBULATORY_CARE_PROVIDER_SITE_OTHER): Payer: Medicare Other

## 2021-07-29 DIAGNOSIS — I442 Atrioventricular block, complete: Secondary | ICD-10-CM | POA: Diagnosis not present

## 2021-07-29 LAB — CUP PACEART REMOTE DEVICE CHECK
Battery Remaining Longevity: 28 mo
Battery Remaining Percentage: 30 %
Battery Voltage: 2.96 V
Brady Statistic AP VP Percent: 9.1 %
Brady Statistic AP VS Percent: 1 %
Brady Statistic AS VP Percent: 91 %
Brady Statistic AS VS Percent: 1 %
Brady Statistic RA Percent Paced: 8.7 %
Brady Statistic RV Percent Paced: 99 %
Date Time Interrogation Session: 20230307020014
Implantable Lead Implant Date: 20160321
Implantable Lead Implant Date: 20160321
Implantable Lead Location: 753859
Implantable Lead Location: 753860
Implantable Pulse Generator Implant Date: 20160321
Lead Channel Impedance Value: 410 Ohm
Lead Channel Impedance Value: 510 Ohm
Lead Channel Pacing Threshold Amplitude: 0.5 V
Lead Channel Pacing Threshold Amplitude: 1.5 V
Lead Channel Pacing Threshold Pulse Width: 0.5 ms
Lead Channel Pacing Threshold Pulse Width: 0.8 ms
Lead Channel Sensing Intrinsic Amplitude: 3.8 mV
Lead Channel Sensing Intrinsic Amplitude: 8.7 mV
Lead Channel Setting Pacing Amplitude: 2.5 V
Lead Channel Setting Pacing Amplitude: 3 V
Lead Channel Setting Pacing Pulse Width: 0.5 ms
Lead Channel Setting Sensing Sensitivity: 8 mV
Pulse Gen Model: 2240
Pulse Gen Serial Number: 7734710

## 2021-07-29 NOTE — Progress Notes (Unsigned)
Electrophysiology Office Note Date: 07/29/2021  ID:  Dean Murphy, DOB 1926/06/19, MRN [id number]  PCP: Tonia Ghent, MD Primary Cardiologist: Ena Dawley, MD Electrophysiologist: Cristopher Peru, MD   CC: Pacemaker follow-up  Dean Murphy is a 88 y.o. male seen today for Cristopher Peru, MD for routine electrophysiology followup.  Since last being seen in our clinic the patient reports doing ***.  he denies chest pain, palpitations, dyspnea, PND, orthopnea, nausea, vomiting, dizziness, syncope, edema, weight gain, or early satiety.  Device History: St. Jude Dual Chamber PPM implanted 07/2014 for CHB  Past Medical History:  Diagnosis Date   Anemia    Arthritis    CAD (coronary artery disease)    a. CABG 1989, b. Myoview low risk 2012.   Carotid artery occlusion    a. Duplex 10/2014: patent R/L CEA with mild hyperplasia in right surgical bulb - followed by vascular.   CHB (complete heart block) [facility]) March 2016   a. s/p STJ dual chamber pacemaker 07/2014.   Chronic diastolic CHF (congestive heart failure) ([place])    a. Dx 01/239 - acute diastolic CHF in the setting of CHB.   Chronic kidney disease, stage IV (severe) (HCC)    stage III/IV as of 2015, [name]   Colon polyps    Complication of anesthesia    Constipation    CVA (cerebral infarction)    Diabetes mellitus    type II   Diverticulosis    Dupuytren's disease    finger right hand contracted   Essential hypertension    GERD (gastroesophageal reflux disease)    Hemorrhoids    Hyperlipidemia    Myocardial infarction ([place]) 1976  and Mar. 19, 2016   Orthostasis    Osteoporosis    PAF (paroxysmal atrial fibrillation) ([place]) March 2016   PONV (postoperative nausea and vomiting)    Presence of permanent cardiac pacemaker    Shortness of breath dyspnea    due to pain   Past Surgical History:  Procedure Laterality Date   CARDIAC CATHETERIZATION  08/11/2014   Procedure: TEMPORARY PACEMAKER;  Surgeon:  [name], MD;  Location: [facility] CATH LAB;  Service: Cardiovascular;;   CAROTID ENDARTERECTOMY  12/31/10   Right   CAROTID ENDARTERECTOMY  02/09/11   left   CHOLECYSTECTOMY     CORONARY ARTERY BYPASS GRAFT  1989   EYE SURGERY Bilateral    Cataract with implants   I & D KNEE WITH POLY EXCHANGE Left 07/29/2015   Procedure: IRRIGATION AND DEBRIDEMENT LEFT KNEE WITH POLY EXCHANGE;  Surgeon: Rod Can, MD;  Location: [facility];  Service: Orthopedics;  Laterality: Left;   IR GENERIC HISTORICAL  03/04/2016   IR US GUIDE VASC ACCESS RIGHT 03/04/2016 Marybelle Killings, MD [facility]INTER[facility] RAD   IR GENERIC HISTORICAL  03/04/2016   IR FLUORO GUIDE CV LINE RIGHT 03/04/2016 Marybelle Killings, MD [facility]   JOINT REPLACEMENT     bilat. knees   PERMANENT PACEMAKER INSERTION N/A 08/13/2014   STJ dual chamber pacemaker implanted by [name] for CHB   TONSILLECTOMY     TOTAL KNEE ARTHROPLASTY     bilateral    Current Outpatient Medications  Medication Sig Dispense Refill   amoxicillin (AMOXIL) 500 MG capsule Take 1 capsule (500 mg total) by mouth 2 (two) times daily. 180 capsule 3   B-D UF III MINI PEN NEEDLES 31G X 5 MM MISC USE DAILY WITH INSULIN PEN 100 each 4   clotrimazole-betamethasone (LOTRISONE) cream APPLY  TO AFFECTED AREA TWICE A DAY 30 g 0   ELIQUIS 2.5 MG TABS tablet TAKE 1 TABLET BY MOUTH TWICE A DAY 180 tablet 1   ferrous sulfate 325 (65 FE) MG tablet TAKE 1 TABLET BY MOUTH ON FRIDAY     glipiZIDE (GLUCOTROL) 5 MG tablet TAKE 2 TABLETS BY MOUTH EVERY MORNING BEFORE BREAKFAST AND 1 TABLET BEFORE SUPPER 270 tablet 3   JANUVIA 50 MG tablet TAKE 1 TABLET BY MOUTH EVERY DAY 90 tablet 2   LANTUS SOLOSTAR 100 UNIT/ML Solostar Pen INJECT 30 UNITS INTO THE SKIN AT BEDTIME 15 mL 3   losartan (COZAAR) 100 MG tablet Take 1 tablet (100 mg total) by mouth daily.     lubiprostone (AMITIZA) 8 MCG capsule TAKE ONE CAPSULE BY MOUTH EACH MORNING 90 capsule 1   metoprolol succinate (TOPROL XL) 25 MG 24 hr tablet Take 1  tablet (25 mg total) by mouth at bedtime. 90 tablet 3   omeprazole (PRILOSEC) 40 MG capsule Take 1 capsule (40 mg total) by mouth every Monday, Wednesday, and Friday.     rosuvastatin (CRESTOR) 20 MG tablet Take 1 tablet (20 mg total) by mouth daily. 90 tablet 3   torsemide (DEMADEX) 10 MG tablet Take 1 tablet (10 mg total) by mouth daily as needed (for swelling).     No current facility-administered medications for this visit.    Allergies:   Zoledronic acid and Nsaids   Social History: Social History   Socioeconomic History   Marital status: Married    Spouse name: Not on file   Number of children: Not on file   Years of education: Not on file   Highest education level: Not on file  Occupational History   Occupation: retired    Fish farm manager: RETIRED  Tobacco Use   Smoking status: Former    Packs/day: 1.00    Years: 25.00    Pack years: 25.00    Types: Cigarettes    Quit date: 01/21/1973    Years since quitting: 48.5   Smokeless tobacco: Never   Tobacco comments:    began smoking in high school, quit age 68  Vaping Use   Vaping Use: Never used  Substance and Sexual Activity   Alcohol use: Yes    Alcohol/week: 0.0 standard drinks    Comment: wine occ   Drug use: No   Sexual activity: Never  Other Topics Concern   Not on file  Social History Narrative   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  Remarried 1998.     3 sons   Social Determinants of Radio broadcast assistant Strain: Low Risk    Difficulty of Paying Living Expenses: Not hard at all  Food Insecurity: No Food Insecurity   Worried About Charity fundraiser in the Last Year: Never true   Arboriculturist in the Last Year: Never true  Transportation Needs: No Transportation Needs   Lack of Transportation (Medical): No   Lack of Transportation (Non-Medical): No  Physical Activity: Inactive   Days of Exercise per Week: 0 days   Minutes of Exercise per Session: 0 min   Stress: No Stress Concern Present   Feeling of Stress : Not at all  Social Connections: Not on file  Intimate Partner Violence: Not At Risk   Fear of Current or Ex-Partner: No   Emotionally Abused: No   Physically Abused: No   Sexually Abused: No  Family History: Family History  Problem Relation Age of Onset   Heart disease Mother        Before age 69   Diabetes Mother    Varicose Veins Mother    Heart attack Mother    Heart attack Father    Heart disease Father        After age 72   Hypertension Father    Heart disease Brother        Before age 85   Hypertension Brother    Heart attack Brother    Diabetes Son    Hypertension Son    Hypertension Son    Diabetes Son    Diabetes Other    Cancer Other    Colon cancer Neg Hx      Review of Systems: All other systems reviewed and are otherwise negative except as noted above.  Physical Exam: There were no vitals filed for this visit.   GEN- The patient is well appearing, alert and oriented x 3 today.   HEENT: normocephalic, atraumatic; sclera clear, conjunctiva pink; hearing intact; oropharynx clear; neck supple  Lungs- Clear to ausculation bilaterally, normal work of breathing.  No wheezes, rales, rhonchi Heart- Regular rate and rhythm, no murmurs, rubs or gallops  GI- soft, non-tender, non-distended, bowel sounds present  Extremities- no clubbing or cyanosis. No edema MS- no significant deformity or atrophy Skin- warm and dry, no rash or lesion; PPM pocket well healed Psych- euthymic mood, full affect Neuro- strength and sensation are intact  PPM Interrogation- reviewed in detail today,  See PACEART report  EKG:  EKG is ordered today. Personal review of ekg ordered today shows ***   Recent Labs: 12/17/2020: ALT 20; BUN 37; Creatinine, Ser 2.59; Hemoglobin 13.5; Platelets 139.0; Potassium 5.2; Sodium 144; TSH 2.16   Wt Readings from Last 3 Encounters:  07/17/21 196 lb (88.9 kg)  06/19/21 200 lb (90.7 kg)   04/29/21 198 lb (89.8 kg)     Other studies Reviewed: Additional studies/ records that were reviewed today include: Previous EP office notes, Previous remote checks, Most recent labwork.   Assessment and Plan:  1. CHB s/p St. Jude PPM  Normal PPM function See Pace Art report No changes today  2. HTN Stable on current regimen   3. Knee infection On long-term suppressive ABx  Current medicines are reviewed at length with the patient today.    Labs/ tests ordered today include: *** No orders of the defined types were placed in this encounter.    Disposition:   Follow up with {Blank single:19197::"Dr. Allred","Dr. Arlan Organ. Klein","Dr. Camnitz","Dr. Lambert","EP APP"} in {Blank single:19197::"2 weeks","4 weeks","3 months","6 months","12 months","as usual post gen change"}    Signed, Shirley Friar, PA-C  07/29/2021 1:53 PM  [address] [place] [address] [phone] (office) [phone] (fax)

## 2021-08-07 ENCOUNTER — Encounter: Payer: Self-pay | Admitting: Student

## 2021-08-07 ENCOUNTER — Other Ambulatory Visit: Payer: Self-pay

## 2021-08-07 ENCOUNTER — Ambulatory Visit: Payer: Medicare Other | Admitting: Student

## 2021-08-07 VITALS — BP 136/68 | HR 71 | Ht 71.0 in | Wt 194.0 lb

## 2021-08-07 DIAGNOSIS — T8454XD Infection and inflammatory reaction due to internal left knee prosthesis, subsequent encounter: Secondary | ICD-10-CM | POA: Diagnosis not present

## 2021-08-07 DIAGNOSIS — I10 Essential (primary) hypertension: Secondary | ICD-10-CM

## 2021-08-07 DIAGNOSIS — I25810 Atherosclerosis of coronary artery bypass graft(s) without angina pectoris: Secondary | ICD-10-CM

## 2021-08-07 DIAGNOSIS — I442 Atrioventricular block, complete: Secondary | ICD-10-CM | POA: Diagnosis not present

## 2021-08-07 LAB — CUP PACEART INCLINIC DEVICE CHECK
Battery Remaining Longevity: 33 mo
Battery Voltage: 2.96 V
Brady Statistic RA Percent Paced: 8.9 %
Brady Statistic RV Percent Paced: 99.85 %
Date Time Interrogation Session: 20230316094520
Implantable Lead Implant Date: 20160321
Implantable Lead Implant Date: 20160321
Implantable Lead Location: 753859
Implantable Lead Location: 753860
Implantable Pulse Generator Implant Date: 20160321
Lead Channel Impedance Value: 425 Ohm
Lead Channel Impedance Value: 650 Ohm
Lead Channel Pacing Threshold Amplitude: 0.75 V
Lead Channel Pacing Threshold Amplitude: 0.75 V
Lead Channel Pacing Threshold Amplitude: 1.25 V
Lead Channel Pacing Threshold Amplitude: 1.25 V
Lead Channel Pacing Threshold Pulse Width: 0.5 ms
Lead Channel Pacing Threshold Pulse Width: 0.5 ms
Lead Channel Pacing Threshold Pulse Width: 0.8 ms
Lead Channel Pacing Threshold Pulse Width: 0.8 ms
Lead Channel Sensing Intrinsic Amplitude: 3.7 mV
Lead Channel Sensing Intrinsic Amplitude: 8.7 mV
Lead Channel Setting Pacing Amplitude: 2.5 V
Lead Channel Setting Pacing Amplitude: 3 V
Lead Channel Setting Pacing Pulse Width: 0.5 ms
Lead Channel Setting Sensing Sensitivity: 8 mV
Pulse Gen Model: 2240
Pulse Gen Serial Number: 7734710

## 2021-08-07 LAB — CBC
Hematocrit: 42.5 % (ref 37.5–51.0)
Hemoglobin: 14 g/dL (ref 13.0–17.7)
MCH: 30.2 pg (ref 26.6–33.0)
MCHC: 32.9 g/dL (ref 31.5–35.7)
MCV: 92 fL (ref 79–97)
Platelets: 167 10*3/uL (ref 150–450)
RBC: 4.64 x10E6/uL (ref 4.14–5.80)
RDW: 12.4 % (ref 11.6–15.4)
WBC: 7.6 10*3/uL (ref 3.4–10.8)

## 2021-08-07 LAB — BASIC METABOLIC PANEL
BUN/Creatinine Ratio: 12 (ref 10–24)
BUN: 32 mg/dL (ref 10–36)
CO2: 24 mmol/L (ref 20–29)
Calcium: 9.6 mg/dL (ref 8.6–10.2)
Chloride: 102 mmol/L (ref 96–106)
Creatinine, Ser: 2.67 mg/dL — ABNORMAL HIGH (ref 0.76–1.27)
Glucose: 332 mg/dL — ABNORMAL HIGH (ref 70–99)
Potassium: 4.5 mmol/L (ref 3.5–5.2)
Sodium: 142 mmol/L (ref 134–144)
eGFR: 22 mL/min/{1.73_m2} — ABNORMAL LOW (ref >59)

## 2021-08-07 NOTE — Patient Instructions (Signed)
Medication Instructions:  ?Your physician recommends that you continue on your current medications as directed. Please refer to the Current Medication list given to you today. ? ?*If you need a refill on your cardiac medications before your next appointment, please call your pharmacy* ? ? ?Lab Work: ?TODAY: BMET, CBC ? ?If you have labs (blood work) drawn today and your tests are completely normal, you will receive your results only by: ?MyChart Message (if you have MyChart) OR ?A paper copy in the mail ?If you have any lab test that is abnormal or we need to change your treatment, we will call you to review the results. ? ?Follow-Up: ?At [facility], you and your health needs are our priority.  As part of our continuing mission to provide you with exceptional heart care, we have created designated Provider Care Teams.  These Care Teams include your primary Cardiologist (physician) and Advanced Practice Providers (APPs -  Physician Assistants and Nurse Practitioners) who all work together to provide you with the care you need, when you need it. ? ?We recommend signing up for the patient portal called "MyChart".  Sign up information is provided on this After Visit Summary.  MyChart is used to connect with patients for Virtual Visits (Telemedicine).  Patients are able to view lab/test results, encounter notes, upcoming appointments, etc.  Non-urgent messages can be sent to your provider as well.   ?To learn more about what you can do with MyChart, go to [email or web address].   ? ?Your next appointment:   ?1 year(s) ? ?The format for your next appointment:   ?In Person ? ?Provider:   ?Cristopher Peru, MD   ?

## 2021-08-11 NOTE — Progress Notes (Signed)
Remote pacemaker transmission.   

## 2021-08-15 ENCOUNTER — Other Ambulatory Visit: Payer: Self-pay | Admitting: Family Medicine

## 2021-08-18 ENCOUNTER — Other Ambulatory Visit: Payer: Self-pay | Admitting: *Deleted

## 2021-08-18 DIAGNOSIS — E785 Hyperlipidemia, unspecified: Secondary | ICD-10-CM

## 2021-08-18 DIAGNOSIS — I10 Essential (primary) hypertension: Secondary | ICD-10-CM

## 2021-08-18 MED ORDER — ROSUVASTATIN CALCIUM 20 MG PO TABS: 20.0000 mg | ORAL_TABLET | Freq: Every day | ORAL | 3 refills | Status: DC

## 2021-08-21 ENCOUNTER — Other Ambulatory Visit: Payer: Self-pay | Admitting: Internal Medicine

## 2021-08-21 DIAGNOSIS — I480 Paroxysmal atrial fibrillation: Secondary | ICD-10-CM

## 2021-08-22 NOTE — Telephone Encounter (Signed)
Prescription refill request for Eliquis received. ? ?Indication: afib  ?Last office visit: 08/07/2021, [place] ?Scr: 2.67, 08/07/2021 ?Age: 88 yo  ?Weight: 88 kg  ? ?Refill sent.  ?

## 2021-09-20 ENCOUNTER — Other Ambulatory Visit: Payer: Self-pay | Admitting: Family Medicine

## 2021-10-28 ENCOUNTER — Ambulatory Visit (INDEPENDENT_AMBULATORY_CARE_PROVIDER_SITE_OTHER): Payer: Medicare Other

## 2021-10-28 DIAGNOSIS — I442 Atrioventricular block, complete: Secondary | ICD-10-CM

## 2021-10-28 LAB — CUP PACEART REMOTE DEVICE CHECK
Battery Remaining Longevity: 24 mo
Battery Remaining Percentage: 28 %
Battery Voltage: 2.95 V
Brady Statistic AP VP Percent: 20 %
Brady Statistic AP VS Percent: 1 %
Brady Statistic AS VP Percent: 80 %
Brady Statistic AS VS Percent: 1 %
Brady Statistic RA Percent Paced: 19 %
Brady Statistic RV Percent Paced: 99 %
Date Time Interrogation Session: 20230606020013
Implantable Lead Implant Date: 20160321
Implantable Lead Implant Date: 20160321
Implantable Lead Location: 753859
Implantable Lead Location: 753860
Implantable Pulse Generator Implant Date: 20160321
Lead Channel Impedance Value: 390 Ohm
Lead Channel Impedance Value: 560 Ohm
Lead Channel Pacing Threshold Amplitude: 0.75 V
Lead Channel Pacing Threshold Amplitude: 1.25 V
Lead Channel Pacing Threshold Pulse Width: 0.5 ms
Lead Channel Pacing Threshold Pulse Width: 0.8 ms
Lead Channel Sensing Intrinsic Amplitude: 12 mV
Lead Channel Sensing Intrinsic Amplitude: 3.5 mV
Lead Channel Setting Pacing Amplitude: 2.5 V
Lead Channel Setting Pacing Amplitude: 3 V
Lead Channel Setting Pacing Pulse Width: 0.5 ms
Lead Channel Setting Sensing Sensitivity: 8 mV
Pulse Gen Model: 2240
Pulse Gen Serial Number: 7734710

## 2021-11-11 NOTE — Progress Notes (Signed)
Remote pacemaker transmission.   

## 2021-12-09 ENCOUNTER — Telehealth: Payer: Self-pay | Admitting: Family Medicine

## 2021-12-09 NOTE — Telephone Encounter (Signed)
LVM for pt to rtn my call to schedule AWV with [facility] call back # [phone] 

## 2021-12-15 ENCOUNTER — Ambulatory Visit (INDEPENDENT_AMBULATORY_CARE_PROVIDER_SITE_OTHER): Payer: Medicare Other

## 2021-12-15 VITALS — Ht 71.0 in | Wt 186.0 lb

## 2021-12-15 DIAGNOSIS — Z0000 Encounter for general adult medical examination without abnormal findings: Secondary | ICD-10-CM | POA: Diagnosis not present

## 2021-12-15 NOTE — Progress Notes (Signed)
I connected with Dean Murphy today by telephone and verified that I am speaking with the correct person using two identifiers. Location patient: home Location provider: work Persons participating in the virtual visit: Dean Murphy, Dean Durand LPN.   I discussed the limitations, risks, security and privacy concerns of performing an evaluation and management service by telephone and the availability of in person appointments. I also discussed with the patient that there may be a patient responsible charge related to this service. The patient expressed understanding and verbally consented to this telephonic visit.    Interactive audio and video telecommunications were attempted between this provider and patient, however failed, due to patient having technical difficulties OR patient did not have access to video capability.  We continued and completed visit with audio only.     Vital signs may be patient reported or missing.  Subjective:   Dean Murphy is a 88 y.o. male who presents for Medicare Annual/Subsequent preventive examination.  Review of Systems     Cardiac Risk Factors include: advanced age (>13mn, >>36women);diabetes mellitus;dyslipidemia;hypertension;male gender     Objective:    Today's Vitals   12/15/21 1041 12/15/21 1042  Weight: 186 lb (84.4 kg)   Height: '5\' 11"'$  (1.803 m)   PainSc:  8    Body mass index is 25.94 kg/m.     12/15/2021   10:49 AM 12/12/2020    2:56 PM 07/12/2019    9:06 AM 07/11/2018    1:45 PM 07/07/2018    8:07 AM 11/05/2016    2:01 PM 06/04/2016    1:14 PM  Advanced Directives  Does Patient Have a Medical Advance Directive? No No No No Yes Yes Yes  Type of Advance Directive     Living will;Healthcare Power of [place] Chart?     No - copy requested  No - copy requested  Would patient like information on creating a medical advance directive?  No -  Patient declined No - Patient declined  No - Patient declined      Current Medications (verified) Outpatient Encounter Medications as of 12/15/2021  Medication Sig   amoxicillin (AMOXIL) 500 MG capsule Take 1 capsule (500 mg total) by mouth 2 (two) times daily.   B-D UF III MINI PEN NEEDLES 31G X 5 MM MISC USE DAILY WITH INSULIN PEN   clotrimazole-betamethasone (LOTRISONE) cream APPLY TO AFFECTED AREA TWICE A DAY   ELIQUIS 2.5 MG TABS tablet TAKE 1 TABLET BY MOUTH TWICE A DAY   ferrous sulfate 325 (65 FE) MG tablet TAKE 1 TABLET BY MOUTH ON FRIDAY   glipiZIDE (GLUCOTROL) 5 MG tablet TAKE 2 TABLETS BY MOUTH EVERY MORNING BEFORE BREAKFAST AND 1 TABLET BEFORE SUPPER   JANUVIA 50 MG tablet TAKE 1 TABLET BY MOUTH EVERY DAY   LANTUS SOLOSTAR 100 UNIT/ML Solostar Pen INJECT 30 UNITS INTO THE SKIN AT BEDTIME   losartan (COZAAR) 100 MG tablet Take 1 tablet (100 mg total) by mouth daily.   lubiprostone (AMITIZA) 8 MCG capsule TAKE ONE CAPSULE BY MOUTH EACH MORNING   metoprolol succinate (TOPROL XL) 25 MG 24 hr tablet Take 1 tablet (25 mg total) by mouth at bedtime.   omeprazole (PRILOSEC) 40 MG capsule Take 1 capsule (40 mg total) by mouth every Monday, Wednesday, and Friday.   rosuvastatin (CRESTOR) 20 MG tablet Take 1 tablet (20 mg total) by mouth daily.   torsemide (DEMADEX) 10  MG tablet Take 1 tablet (10 mg total) by mouth daily as needed (for swelling).   No facility-administered encounter medications on file as of 12/15/2021.    Allergies (verified) Zoledronic acid and Nsaids   History: Past Medical History:  Diagnosis Date   Anemia    Arthritis    CAD (coronary artery disease)    a. CABG 1989, b. Myoview low risk 2012.   Carotid artery occlusion    a. Duplex 10/2014: patent R/L CEA with mild hyperplasia in right surgical bulb - followed by vascular.   CHB (complete heart block) [facility]) March 2016   a. s/p STJ dual chamber pacemaker 07/2014.   Chronic diastolic CHF (congestive heart  failure) ([place])    a. Dx 0/9323 - acute diastolic CHF in the setting of CHB.   Chronic kidney disease, stage IV (severe) (HCC)    stage III/IV as of 2015, [name]   Colon polyps    Complication of anesthesia    Constipation    CVA (cerebral infarction)    Diabetes mellitus    type II   Diverticulosis    Dupuytren's disease    finger right hand contracted   Essential hypertension    GERD (gastroesophageal reflux disease)    Hemorrhoids    Hyperlipidemia    Myocardial infarction ([place]) 1976  and Mar. 19, 2016   Orthostasis    Osteoporosis    PAF (paroxysmal atrial fibrillation) ([place]) March 2016   PONV (postoperative nausea and vomiting)    Presence of permanent cardiac pacemaker    Shortness of breath dyspnea    due to pain   Past Surgical History:  Procedure Laterality Date   CARDIAC CATHETERIZATION  08/11/2014   Procedure: TEMPORARY PACEMAKER;  Surgeon: [name], MD;  Location: [facility] CATH LAB;  Service: Cardiovascular;;   CAROTID ENDARTERECTOMY  12/31/10   Right   CAROTID ENDARTERECTOMY  02/09/11   left   CHOLECYSTECTOMY     CORONARY ARTERY BYPASS GRAFT  1989   EYE SURGERY Bilateral    Cataract with implants   I & D KNEE WITH POLY EXCHANGE Left 07/29/2015   Procedure: IRRIGATION AND DEBRIDEMENT LEFT KNEE WITH POLY EXCHANGE;  Surgeon: Rod Can, MD;  Location: [place];  Service: Orthopedics;  Laterality: Left;   IR GENERIC HISTORICAL  03/04/2016   IR US GUIDE VASC ACCESS RIGHT 03/04/2016 Marybelle Killings, MD [facility]INTER[facility]   IR GENERIC HISTORICAL  03/04/2016   IR FLUORO GUIDE CV LINE RIGHT 03/04/2016 Marybelle Killings, MD [facility]   JOINT REPLACEMENT     bilat. knees   PERMANENT PACEMAKER INSERTION N/A 08/13/2014   STJ dual chamber pacemaker implanted by [name] for CHB   TONSILLECTOMY     TOTAL KNEE ARTHROPLASTY     bilateral   Family History  Problem Relation Age of Onset   Heart disease Mother        Before age 6   Diabetes Mother    Varicose Veins  Mother    Heart attack Mother    Heart attack Father    Heart disease Father        After age 53   Hypertension Father    Heart disease Brother        Before age 42   Hypertension Brother    Heart attack Brother    Diabetes Son    Hypertension Son    Hypertension Son    Diabetes Son    Diabetes Other    Cancer  Other    Colon cancer Neg Hx    Social History   Socioeconomic History   Marital status: Married    Spouse name: Not on file   Number of children: Not on file   Years of education: Not on file   Highest education level: Not on file  Occupational History   Occupation: retired    Fish farm manager: RETIRED  Tobacco Use   Smoking status: Former    Packs/day: 1.00    Years: 25.00    Total pack years: 25.00    Types: Cigarettes    Quit date: 01/21/1973    Years since quitting: 48.9   Smokeless tobacco: Never   Tobacco comments:    began smoking in high school, quit age 64  Vaping Use   Vaping Use: Never used  Substance and Sexual Activity   Alcohol use: Yes    Alcohol/week: 0.0 standard drinks of alcohol    Comment: wine occ   Drug use: No   Sexual activity: Never  Other Topics Concern   Not on file  Social History Narrative   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  Remarried 1998.     3 sons   Social Determinants of Health   Financial Resource Strain: Low Risk  (12/15/2021)   Overall Financial Resource Strain (CARDIA)    Difficulty of Paying Living Expenses: Not hard at all  Food Insecurity: No Food Insecurity (12/15/2021)   Hunger Vital Sign    Worried About Running Out of Food in the Last Year: Never true    Ran Out of Food in the Last Year: Never true  Transportation Needs: No Transportation Needs (12/15/2021)   PRAPARE - Hydrologist (Medical): No    Lack of Transportation (Non-Medical): No  Physical Activity: Inactive (12/15/2021)   Exercise Vital Sign    Days of Exercise per Week: 0 days     Minutes of Exercise per Session: 0 min  Stress: No Stress Concern Present (12/15/2021)   [place]    Feeling of Stress : Not at all  Social Connections: Not on file    Tobacco Counseling Counseling given: Not Answered Tobacco comments: began smoking in high school, quit age 68   Clinical Intake:  Pre-visit preparation completed: Yes  Pain : 0-10 Pain Score: 8  Pain Type: Acute pain Pain Location: Sacrum Pain Descriptors / Indicators: Aching, Dull Pain Onset: 1 to 4 weeks ago Pain Frequency: Constant     Nutritional Status: BMI 25 -29 Overweight Nutritional Risks: None Diabetes: Yes  How often do you need to have someone help you when you read instructions, pamphlets, or other written materials from your doctor or pharmacy?: 1 - Never What is the last grade level you completed in school?: 12th grade  Diabetic? Yes Nutrition Risk Assessment:  Has the patient had any N/V/D within the last 2 months?  No  Does the patient have any non-healing wounds?  No  Has the patient had any unintentional weight loss or weight gain?  No   Diabetes:  Is the patient diabetic?  Yes  If diabetic, was a CBG obtained today?  No  Did the patient bring in their glucometer from home?  No  How often do you monitor your CBG's? daily.   Financial Strains and Diabetes Management:  Are you having any financial strains with the device, your supplies or your  medication? No .  Does the patient want to be seen by Chronic Care Management for management of their diabetes?  No  Would the patient like to be referred to a Nutritionist or for Diabetic Management?  No   Diabetic Exams:  Diabetic Eye Exam: Completed 06/02/2021 Diabetic Foot Exam: Completed 12/17/2020   Interpreter Needed?: No  Information entered by :: NAllen LPN   Activities of Daily Living    12/15/2021   10:51 AM  In your present state of health, do you  have any difficulty performing the following activities:  Hearing? 0  Vision? 0  Difficulty concentrating or making decisions? 0  Walking or climbing stairs? 1  Dressing or bathing? 0  Doing errands, shopping? 0  Preparing Food and eating ? N  Using the Toilet? N  In the past six months, have you accidently leaked urine? N  Do you have problems with loss of bowel control? N  Managing your Medications? N  Managing your Finances? N  Housekeeping or managing your Housekeeping? N    Patient Care Team: Tonia Ghent, MD as PCP - General (Family Medicine) Dorothy Spark, MD as PCP - Cardiology (Cardiology) Evans Lance, MD as PCP - Electrophysiology (Cardiology) Murlean Iba, MD (Internal Medicine) Katy Apo, MD as Consulting Physician (Ophthalmology) Dorothy Spark, MD as Consulting Physician (Cardiology) Eula Listen, DDS as Referring Physician (Dentistry) Danella Sensing, MD as Consulting Physician (Dermatology) Rod Can, MD as Consulting Physician (Orthopedic Surgery) Evans Lance, MD as Consulting Physician (Cardiology) Michel Bickers, MD as Consulting Physician (Infectious Diseases)  Indicate any recent Medical Services you may have received from other than [facility] providers in the past year (date may be approximate).     Assessment:   This is a routine wellness examination for Rose.  Hearing/Vision screen Vision Screening - Comments:: Regular eye exams, [facility], Dr. Prudencio Burly  Dietary issues and exercise activities discussed: Current Exercise Habits: The patient does not participate in regular exercise at present   Goals Addressed             This Visit's Progress    Patient Stated       12/15/2021, no goals       Depression Screen    12/15/2021   10:50 AM 12/12/2020    2:57 PM 08/13/2020    8:35 AM 07/12/2019    9:09 AM 06/28/2019    9:37 AM 07/07/2018    8:07 AM 06/10/2017    9:16 AM  PHQ 2/9 Scores  PHQ - 2 Score 0 0 0 0 0 0  0  PHQ- 9 Score  0  0  0     Fall Risk    12/15/2021   10:49 AM 12/12/2020    2:57 PM 08/13/2020    8:35 AM 07/12/2019    9:07 AM 06/28/2019    9:37 AM  Fall Risk   Falls in the past year? 1 1 0 1 1  Comment lost balance   tripped and fell   Number falls in past yr: 0 0 0 0 1  Injury with Fall? 1 1 0 1 1  Comment hurt sacrum   broke 2 ribs   Risk for fall due to : Medication side effect History of fall(s);Impaired balance/gait;Medication side effect History of fall(s) Medication side effect History of fall(s)  Follow up Falls evaluation completed;Education provided;Falls prevention discussed Falls evaluation completed;Falls prevention discussed Falls evaluation completed Falls evaluation completed;Falls prevention discussed Falls evaluation completed  FALL RISK PREVENTION PERTAINING TO THE HOME:  Any stairs in or around the home? Yes  If so, are there any without handrails? No  Home free of loose throw rugs in walkways, pet beds, electrical cords, etc? Yes  Adequate lighting in your home to reduce risk of falls? Yes   ASSISTIVE DEVICES UTILIZED TO PREVENT FALLS:  Life alert? No  Use of a cane, walker or w/c? No  Grab bars in the bathroom? Yes  Shower chair or bench in shower? No  Elevated toilet seat or a handicapped toilet? No   TIMED UP AND GO:  Was the test performed? No .       Cognitive Function:    12/12/2020    3:02 PM 07/12/2019    9:11 AM 07/07/2018    8:07 AM 06/04/2016    1:29 PM  MMSE - Mini Mental State Exam  Orientation to time '5 4 5 5  '$ Orientation to Place '5 5 5 5  '$ Registration '3 3 3 3  '$ Attention/ Calculation 5 0 0 0  Recall '3 3 3 3  '$ Language- name 2 objects   0 0  Language- repeat '1 1 1 1  '$ Language- follow 3 step command   3 3  Language- read & follow direction   0 0  Write a sentence   0 0  Copy design   0 0  Total score   20 20        12/15/2021   10:52 AM  6CIT Screen  What Year? 0 points  What month? 0 points  What time? 0 points   Count back from 20 0 points  Months in reverse 4 points  Repeat phrase 2 points  Total Score 6 points    Immunizations Immunization History  Administered Date(s) Administered   Fluad Quad(high Dose 65+) 01/20/2019   Influenza Split 02/03/2012   Influenza Whole 03/16/2007, 01/23/2010   Influenza, High Dose Seasonal PF 03/27/2013, 02/17/2018, 03/08/2020, 03/05/2021   Influenza,inj,Quad PF,6+ Mos 03/15/2014, 03/15/2015, 02/27/2016   Influenza-Unspecified 03/19/2017, 02/17/2018   Moderna SARS-COV2 Booster Vaccination 04/26/2020   Moderna Sars-Covid-2 Vaccination 06/07/2019, 07/05/2019   Pfizer Covid-19 Vaccine Bivalent Booster 38yr & up 04/23/2021   Pneumococcal Conjugate-13 06/24/2015   Pneumococcal Polysaccharide-23 09/24/2009   Td 07/26/2008   Zoster Recombinat (Shingrix) 01/06/2021, 04/14/2021    TDAP status: Due, Education has been provided regarding the importance of this vaccine. Advised may receive this vaccine at local pharmacy or Health Dept. Aware to provide a copy of the vaccination record if obtained from local pharmacy or Health Dept. Verbalized acceptance and understanding.  Flu Vaccine status: Up to date  Pneumococcal vaccine status: Up to date  Covid-19 vaccine status: Completed vaccines  Qualifies for Shingles Vaccine? Yes   Zostavax completed Yes   Shingrix Completed?: Yes  Screening Tests Health Maintenance  Topic Date Due   TETANUS/TDAP  07/27/2018   COVID-19 Vaccine (4 - Moderna series) 08/21/2021   FOOT EXAM  12/17/2021   HEMOGLOBIN A1C  12/17/2021   INFLUENZA VACCINE  12/23/2021   OPHTHALMOLOGY EXAM  06/02/2022   Pneumonia Vaccine 88 Years old  Completed   Zoster Vaccines- Shingrix  Completed   HPV VACCINES  Aged Out    Health Maintenance  Health Maintenance Due  Topic Date Due   TETANUS/TDAP  07/27/2018   COVID-19 Vaccine (4 - Moderna series) 08/21/2021    Colorectal cancer screening: No longer required.   Lung Cancer Screening:  (Low Dose CT Chest recommended if Age  55-80 years, 30 pack-year currently smoking OR have quit w/in 15years.) does not qualify.   Lung Cancer Screening Referral: no  Additional Screening:  Hepatitis C Screening: does not qualify;  Vision Screening: Recommended annual ophthalmology exams for early detection of glaucoma and other disorders of the eye. Is the patient up to date with their annual eye exam?  Yes  Who is the provider or what is the name of the office in which the patient attends annual eye exams? Dr. Prudencio Burly If pt is not established with a provider, would they like to be referred to a provider to establish care? No .   Dental Screening: Recommended annual dental exams for proper oral hygiene  Community Resource Referral / Chronic Care Management: CRR required this visit?  No   CCM required this visit?  No      Plan:     I have personally reviewed and noted the following in the patient's chart:   Medical and social history Use of alcohol, tobacco or illicit drugs  Current medications and supplements including opioid prescriptions. Patient is not currently taking opioid prescriptions. Functional ability and status Nutritional status Physical activity Advanced directives List of other physicians Hospitalizations, surgeries, and ER visits in previous 12 months Vitals Screenings to include cognitive, depression, and falls Referrals and appointments  In addition, I have reviewed and discussed with patient certain preventive protocols, quality metrics, and best practice recommendations. A written personalized care plan for preventive services as well as general preventive health recommendations were provided to patient.     Kellie Simmering, LPN   [id number]   Nurse Notes: none  Due to this being a virtual visit, the after visit summary with patients personalized plan was offered to patient via mail or my-chart.  to pick up at office at next visit

## 2021-12-15 NOTE — Patient Instructions (Signed)
[name] , Thank you for taking time to come for your Medicare Wellness Visit. I appreciate your ongoing commitment to your health goals. Please review the following plan we discussed and let me know if I can assist you in the future.   Screening recommendations/referrals: Colonoscopy: not required Recommended yearly ophthalmology/optometry visit for glaucoma screening and checkup Recommended yearly dental visit for hygiene and checkup  Vaccinations: Influenza vaccine: due 12/23/2021 Pneumococcal vaccine: completed 06/24/2015 Tdap vaccine: due Shingles vaccine: completed   Covid-19:  04/23/2021, 04/26/2020, 07/05/2019, 06/07/2019  Advanced directives: Advance directive discussed with you today.   Conditions/risks identified: none  Next appointment: Follow up in one year for your annual wellness visit.   Preventive Care 86 Years and Older, Male Preventive care refers to lifestyle choices and visits with your health care provider that can promote health and wellness. What does preventive care include? A yearly physical exam. This is also called an annual well check. Dental exams once or twice a year. Routine eye exams. Ask your health care provider how often you should have your eyes checked. Personal lifestyle choices, including: Daily care of your teeth and gums. Regular physical activity. Eating a healthy diet. Avoiding tobacco and drug use. Limiting alcohol use. Practicing safe sex. Taking low doses of aspirin every day. Taking vitamin and mineral supplements as recommended by your health care provider. What happens during an annual well check? The services and screenings done by your health care provider during your annual well check will depend on your age, overall health, lifestyle risk factors, and family history of disease. Counseling  Your health care provider may ask you questions about your: Alcohol use. Tobacco use. Drug use. Emotional well-being. Home and  relationship well-being. Sexual activity. Eating habits. History of falls. Memory and ability to understand (cognition). Work and work Statistician. Screening  You may have the following tests or measurements: Height, weight, and BMI. Blood pressure. Lipid and cholesterol levels. These may be checked every 5 years, or more frequently if you are over 73 years old. Skin check. Lung cancer screening. You may have this screening every year starting at age 85 if you have a 30-pack-year history of smoking and currently smoke or have quit within the past 15 years. Fecal occult blood test (FOBT) of the stool. You may have this test every year starting at age 76. Flexible sigmoidoscopy or colonoscopy. You may have a sigmoidoscopy every 5 years or a colonoscopy every 10 years starting at age 61. Prostate cancer screening. Recommendations will vary depending on your family history and other risks. Hepatitis C blood test. Hepatitis B blood test. Sexually transmitted disease (STD) testing. Diabetes screening. This is done by checking your blood sugar (glucose) after you have not eaten for a while (fasting). You may have this done every 1-3 years. Abdominal aortic aneurysm (AAA) screening. You may need this if you are a current or former smoker. Osteoporosis. You may be screened starting at age 31 if you are at high risk. Talk with your health care provider about your test results, treatment options, and if necessary, the need for more tests. Vaccines  Your health care provider may recommend certain vaccines, such as: Influenza vaccine. This is recommended every year. Tetanus, diphtheria, and acellular pertussis (Tdap, Td) vaccine. You may need a Td booster every 10 years. Zoster vaccine. You may need this after age 21. Pneumococcal 13-valent conjugate (PCV13) vaccine. One dose is recommended after age 60. Pneumococcal polysaccharide (PPSV23) vaccine. One dose is recommended after age  64. Talk to your  health care provider about which screenings and vaccines you need and how often you need them. This information is not intended to replace advice given to you by your health care provider. Make sure you discuss any questions you have with your health care provider. Document Released: 06/07/2015 Document Revised: 01/29/2016 Document Reviewed: 03/12/2015 Elsevier Interactive Patient Education  2017 [place] Prevention in the Home Falls can cause injuries. They can happen to people of all ages. There are many things you can do to make your home safe and to help prevent falls. What can I do on the outside of my home? Regularly fix the edges of walkways and driveways and fix any cracks. Remove anything that might make you trip as you walk through a door, such as a raised step or threshold. Trim any bushes or trees on the path to your home. Use bright outdoor lighting. Clear any walking paths of anything that might make someone trip, such as rocks or tools. Regularly check to see if handrails are loose or broken. Make sure that both sides of any steps have handrails. Any raised decks and porches should have guardrails on the edges. Have any leaves, snow, or ice cleared regularly. Use sand or salt on walking paths during winter. Clean up any spills in your garage right away. This includes oil or grease spills. What can I do in the bathroom? Use night lights. Install grab bars by the toilet and in the tub and shower. Do not use towel bars as grab bars. Use non-skid mats or decals in the tub or shower. If you need to sit down in the shower, use a plastic, non-slip stool. Keep the floor dry. Clean up any water that spills on the floor as soon as it happens. Remove soap buildup in the tub or shower regularly. Attach bath mats securely with double-sided non-slip rug tape. Do not have throw rugs and other things on the floor that can make you trip. What can I do in the bedroom? Use night  lights. Make sure that you have a light by your bed that is easy to reach. Do not use any sheets or blankets that are too big for your bed. They should not hang down onto the floor. Have a firm chair that has side arms. You can use this for support while you get dressed. Do not have throw rugs and other things on the floor that can make you trip. What can I do in the kitchen? Clean up any spills right away. Avoid walking on wet floors. Keep items that you use a lot in easy-to-reach places. If you need to reach something above you, use a strong step stool that has a grab bar. Keep electrical cords out of the way. Do not use floor polish or wax that makes floors slippery. If you must use wax, use non-skid floor wax. Do not have throw rugs and other things on the floor that can make you trip. What can I do with my stairs? Do not leave any items on the stairs. Make sure that there are handrails on both sides of the stairs and use them. Fix handrails that are broken or loose. Make sure that handrails are as long as the stairways. Check any carpeting to make sure that it is firmly attached to the stairs. Fix any carpet that is loose or worn. Avoid having throw rugs at the top or bottom of the stairs. If you do have  throw rugs, attach them to the floor with carpet tape. Make sure that you have a light switch at the top of the stairs and the bottom of the stairs. If you do not have them, ask someone to add them for you. What else can I do to help prevent falls? Wear shoes that: Do not have high heels. Have rubber bottoms. Are comfortable and fit you well. Are closed at the toe. Do not wear sandals. If you use a stepladder: Make sure that it is fully opened. Do not climb a closed stepladder. Make sure that both sides of the stepladder are locked into place. Ask someone to hold it for you, if possible. Clearly mark and make sure that you can see: Any grab bars or handrails. First and last  steps. Where the edge of each step is. Use tools that help you move around (mobility aids) if they are needed. These include: Canes. Walkers. Scooters. Crutches. Turn on the lights when you go into a dark area. Replace any light bulbs as soon as they burn out. Set up your furniture so you have a clear path. Avoid moving your furniture around. If any of your floors are uneven, fix them. If there are any pets around you, be aware of where they are. Review your medicines with your doctor. Some medicines can make you feel dizzy. This can increase your chance of falling. Ask your doctor what other things that you can do to help prevent falls. This information is not intended to replace advice given to you by your health care provider. Make sure you discuss any questions you have with your health care provider. Document Released: 03/07/2009 Document Revised: 10/17/2015 Document Reviewed: 06/15/2014 Elsevier Interactive Patient Education  2017 [employer].

## 2021-12-18 ENCOUNTER — Ambulatory Visit (INDEPENDENT_AMBULATORY_CARE_PROVIDER_SITE_OTHER)
Admission: RE | Admit: 2021-12-18 | Discharge: 2021-12-18 | Disposition: A | Discharge status: Home / Self Care | Payer: Medicare Other | Source: Ambulatory Visit | Attending: Family Medicine | Admitting: Family Medicine

## 2021-12-18 ENCOUNTER — Ambulatory Visit: Payer: Medicare Other | Admitting: Family Medicine

## 2021-12-18 ENCOUNTER — Encounter: Payer: Self-pay | Admitting: Family Medicine

## 2021-12-18 VITALS — BP 122/66 | HR 69 | Temp 97.7°F | Ht 71.0 in | Wt 188.0 lb

## 2021-12-18 DIAGNOSIS — Z7189 Other specified counseling: Secondary | ICD-10-CM

## 2021-12-18 DIAGNOSIS — L089 Local infection of the skin and subcutaneous tissue, unspecified: Secondary | ICD-10-CM

## 2021-12-18 DIAGNOSIS — Z0000 Encounter for general adult medical examination without abnormal findings: Secondary | ICD-10-CM

## 2021-12-18 DIAGNOSIS — E1122 Type 2 diabetes mellitus with diabetic chronic kidney disease: Secondary | ICD-10-CM

## 2021-12-18 DIAGNOSIS — T8454XD Infection and inflammatory reaction due to internal left knee prosthesis, subsequent encounter: Secondary | ICD-10-CM

## 2021-12-18 DIAGNOSIS — M545 Low back pain, unspecified: Secondary | ICD-10-CM

## 2021-12-18 DIAGNOSIS — N183 Chronic kidney disease, stage 3 unspecified: Secondary | ICD-10-CM

## 2021-12-18 DIAGNOSIS — E785 Hyperlipidemia, unspecified: Secondary | ICD-10-CM

## 2021-12-18 DIAGNOSIS — D508 Other iron deficiency anemias: Secondary | ICD-10-CM

## 2021-12-18 DIAGNOSIS — W19XXXA Unspecified fall, initial encounter: Secondary | ICD-10-CM

## 2021-12-18 DIAGNOSIS — E119 Type 2 diabetes mellitus without complications: Secondary | ICD-10-CM

## 2021-12-18 DIAGNOSIS — I10 Essential (primary) hypertension: Secondary | ICD-10-CM

## 2021-12-18 LAB — COMPREHENSIVE METABOLIC PANEL
ALT: 65 U/L — ABNORMAL HIGH (ref 0–53)
AST: 93 U/L — ABNORMAL HIGH (ref 0–37)
Albumin: 3.8 g/dL (ref 3.5–5.2)
Alkaline Phosphatase: 581 U/L — ABNORMAL HIGH (ref 39–117)
BUN: 35 mg/dL — ABNORMAL HIGH (ref 6–23)
CO2: 26 mEq/L (ref 19–32)
Calcium: 9.3 mg/dL (ref 8.4–10.5)
Chloride: 112 mEq/L (ref 96–112)
Creatinine, Ser: 2.68 mg/dL — ABNORMAL HIGH (ref 0.40–1.50)
GFR: 19.99 mL/min — ABNORMAL LOW (ref >60.00)
Glucose, Bld: 107 mg/dL — ABNORMAL HIGH (ref 70–99)
Potassium: 4.9 mEq/L (ref 3.5–5.1)
Sodium: 148 mEq/L — ABNORMAL HIGH (ref 135–145)
Total Bilirubin: 1.1 mg/dL (ref 0.2–1.2)
Total Protein: 6.5 g/dL (ref 6.0–8.3)

## 2021-12-18 LAB — CBC WITH DIFFERENTIAL/PLATELET
Basophils Absolute: 0.1 10*3/uL (ref 0.0–0.1)
Basophils Relative: 1.1 % (ref 0.0–3.0)
Eosinophils Absolute: 0.3 10*3/uL (ref 0.0–0.7)
Eosinophils Relative: 3.7 % (ref 0.0–5.0)
HCT: 40.7 % (ref 39.0–52.0)
Hemoglobin: 13.4 g/dL (ref 13.0–17.0)
Lymphocytes Relative: 24.8 % (ref 12.0–46.0)
Lymphs Abs: 1.9 10*3/uL (ref 0.7–4.0)
MCHC: 32.8 g/dL (ref 30.0–36.0)
MCV: 92.9 fl (ref 78.0–100.0)
Monocytes Absolute: 0.5 10*3/uL (ref 0.1–1.0)
Monocytes Relative: 6.6 % (ref 3.0–12.0)
Neutro Abs: 5 10*3/uL (ref 1.4–7.7)
Neutrophils Relative %: 63.8 % (ref 43.0–77.0)
Platelets: 129 10*3/uL — ABNORMAL LOW (ref 150.0–400.0)
RBC: 4.38 Mil/uL (ref 4.22–5.81)
RDW: 15.4 % (ref 11.5–15.5)
WBC: 7.8 10*3/uL (ref 4.0–10.5)

## 2021-12-18 LAB — HEMOGLOBIN A1C: Hgb A1c MFr Bld: 8 % — ABNORMAL HIGH (ref 4.6–6.5)

## 2021-12-18 LAB — IRON: Iron: 89 ug/dL (ref 42–165)

## 2021-12-18 LAB — TSH: TSH: 3.5 u[IU]/mL (ref 0.35–5.50)

## 2021-12-18 LAB — URIC ACID: Uric Acid, Serum: 4.9 mg/dL (ref 4.0–7.8)

## 2021-12-18 MED ORDER — TORSEMIDE 10 MG PO TABS: 10.0000 mg | ORAL_TABLET | ORAL | Status: DC

## 2021-12-18 MED ORDER — LANTUS SOLOSTAR 100 UNIT/ML ~~LOC~~ SOPN: 34.0000 [IU] | PEN_INJECTOR | Freq: Every day | SUBCUTANEOUS | Status: DC

## 2021-12-18 NOTE — Patient Instructions (Addendum)
Go to the lab on the way out.   If you have mychart we'll likely use that to update you.    Take care.  Glad to see you. Update me as needed.  Plan on recheck in 4 months.    Let me know if want to start PT.  I think that is a good idea.    I would move tripping hazards out of the way.    Think about getting a fall button.  I think it would be a good idea.   Call [phone] for details about the services offered by [facility].  Hold your eliquis today and tomorrow come back tomorrow at 3pm for me to work on your back.    Try tylenol '500mg'$  tabs.  2 tabs 3 times a day.  I wouldn't use ibuprofen.  You can try using diclofenac gel on your lower back.  Don't use it on the irritated cyst on your back.

## 2021-12-18 NOTE — Progress Notes (Signed)
[place] about 3 weeks ago.  Turned and then fell.  Was at home.  He didn't pass out and wasn't lightheaded prior.  Had been taking advil in the meantime.  D/w pt about avoiding oral nsaids.  Still on eliquis.  No bleeding.  He thought his foot got caught on a mat at home, ie he tripped.  Balance changes d/w pt.  No foot drop. Still having tailbone pain.  Discussed removing obstacles and fall hazards and getting a fall button.  Information given to patient and son.  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  No knee pain, no fevers.  Compliant.  On long term suppression.  Doing well from that.  Diabetes:  Using medications without difficulties: yes Hypoglycemic episodes:no Hyperglycemic episodes:no Feet problems: no Blood Sugars averaging: ~140-150, rarely down to 120.   eye exam within last year: yes Labs pending.   2cm irritated seb cyst on the back.  On eliquis.  See exam.  See following clinic note.  H/o anemia, f/u labs pending.  See notes on labs.  Elevated Cholesterol: Using medications without problems: yes Muscle aches: not from statin Diet compliance: d/w pt.  Exercise: limited by balance changes.    Hypertension:    Using medication without problems or lightheadedness: yes Chest pain with exertion:no- he had occ discomfort ie once a year that isn't exertional.   Edema:occ/rare.  Short of breath:no He has been taking torsemide Monday and Friday.    Wife designated if patient were incapacitated.   Vaccines d/w pt.   covid vaccine done shingrix prev done.   Defer colon and prostate cancer screening at this point, patient agrees.    PMH and SH reviewed  Meds, vitals, and allergies reviewed.   ROS: Per HPI unless specifically indicated in ROS section   GEN: nad, alert and oriented HEENT: ncat NECK: supple w/o LA CV: rrr. PULM: ctab, no inc wob ABD: soft, +bs EXT: no edema SKIN: no acute rash but 2 cm infected sebaceous cyst noted on the  back.  Diabetic foot exam: Normal inspection No skin breakdown No calluses  Normal DP pulses Normal sensation to light touch and monofilament Nails slightly thickened.    40 minutes were devoted to patient care in this encounter (this includes time spent reviewing the patient's file/history, interviewing and examining the patient, counseling/reviewing plan with patient).

## 2021-12-19 ENCOUNTER — Encounter: Payer: Self-pay | Admitting: Family Medicine

## 2021-12-19 ENCOUNTER — Ambulatory Visit: Payer: Medicare Other | Admitting: Family Medicine

## 2021-12-19 VITALS — BP 134/58 | HR 68 | Temp 97.3°F | Ht 71.0 in | Wt 189.0 lb

## 2021-12-19 DIAGNOSIS — L089 Local infection of the skin and subcutaneous tissue, unspecified: Secondary | ICD-10-CM

## 2021-12-19 DIAGNOSIS — L723 Sebaceous cyst: Secondary | ICD-10-CM | POA: Diagnosis not present

## 2021-12-19 DIAGNOSIS — R748 Abnormal levels of other serum enzymes: Secondary | ICD-10-CM

## 2021-12-19 DIAGNOSIS — S32010D Wedge compression fracture of first lumbar vertebra, subsequent encounter for fracture with routine healing: Secondary | ICD-10-CM

## 2021-12-19 LAB — PTH, INTACT AND CALCIUM
Calcium: 9.3 mg/dL (ref 8.6–10.3)
PTH: 91 pg/mL — ABNORMAL HIGH (ref 16–77)

## 2021-12-19 MED ORDER — HYDROCODONE-ACETAMINOPHEN 5-325 MG PO TABS: 0.5000 | ORAL_TABLET | Freq: Three times a day (TID) | ORAL | 0 refills | Status: DC | PRN

## 2021-12-19 MED ORDER — POLYETHYLENE GLYCOL 3350 17 GM/SCOOP PO POWD: 17.0000 g | Freq: Every day | ORAL | Status: DC | PRN

## 2021-12-19 NOTE — Progress Notes (Unsigned)
Discussed recent events.  He has an alkaline phosphatase elevation, greater than his AST and ALT elevation.  He has concurrent vertebral fracture noted which could contribute to his alkaline phosphatase elevation.  We talked about pain control and managing constipation.  It is reasonable to avoid NSAIDs.  Imaging and labs reviewed with patient.  We agreed to check his alkaline phosphatase with a fractionated level and also check his vitamin D.  We talked about using hydrocodone as needed for pain with sedation caution and constipation precautions.  He can use MiraLAX if needed.  If his alkaline phosphatase shows a hepatic predominance we can consider right upper quadrant ultrasound.  Otherwise we can consider CT L-spine to evaluate the fracture.  We can also recheck his LFTs later on.  I told the patient I would update Dr. Candiss Norse about his most recent labs.  Also here for incision and drainage of sebaceous cyst.  I&D  Meds, vitals, and allergies reviewed.   Indication: suspect abscess  Pt complaints of: erythema, pain, swelling  Location: Mid back.  Size: 2 cm  Informed consent obtained.  Pt aware of risks not limited to but including infection, bleeding, damage to near by organs.  Prep: etoh/betadine  Anesthesia: 1%lidocaine with epi, good effect  Incision made with #11 blade  Wound explored and loculations removed  Wound packed with iodoform gauze  Tolerated well  Routine postprocedure instructions d/w pt- remove packing Sunday morning, keep area clean and bandaged, follow up if concerns/spreading erythema/pain.

## 2021-12-19 NOTE — Patient Instructions (Addendum)
Go to the lab on the way out.   If you have mychart we'll likely use that to update you.    Hydrocodone if needed for pain.  Sedation caution.  Use miralax to deal with constipation.   Pull the packing Sunday AM and keep covered with a bandage.  Wash with soapy water after the packing is out.   Restart eliquis today.    Take care.  Glad to see you.

## 2021-12-20 LAB — VITAMIN D 25 HYDROXY (VIT D DEFICIENCY, FRACTURES): Vit D, 25-Hydroxy: 49 ng/mL (ref 30–100)

## 2021-12-21 DIAGNOSIS — L089 Local infection of the skin and subcutaneous tissue, unspecified: Secondary | ICD-10-CM | POA: Insufficient documentation

## 2021-12-21 DIAGNOSIS — R748 Abnormal levels of other serum enzymes: Secondary | ICD-10-CM | POA: Insufficient documentation

## 2021-12-21 DIAGNOSIS — S32010A Wedge compression fracture of first lumbar vertebra, initial encounter for closed fracture: Secondary | ICD-10-CM | POA: Insufficient documentation

## 2021-12-21 NOTE — Assessment & Plan Note (Signed)
Continue Amoxil.

## 2021-12-21 NOTE — Assessment & Plan Note (Signed)
Hydrocodone as needed for pain with sedation caution.  Constipation cautions.  Use MiraLAX as needed.  Update me as needed.  See above.

## 2021-12-21 NOTE — Assessment & Plan Note (Addendum)
He had held his Eliquis prior to the visit today.  Tolerated well with minimal oozing.  No persistent bleeding.  No complications.  Routine cautions given to patient.  Can restart Eliquis today.

## 2021-12-21 NOTE — Assessment & Plan Note (Signed)
Recheck fractionated alk phos level with likely either RUQ [place] vs CT L spine after that.   We can recheck LFTs later on.

## 2021-12-21 NOTE — Assessment & Plan Note (Signed)
Continue losartan.  He is taking torsemide Monday and Friday.  See notes on labs.

## 2021-12-21 NOTE — Assessment & Plan Note (Signed)
History of.  See notes on labs.  Continue iron.

## 2021-12-21 NOTE — Assessment & Plan Note (Signed)
Hold Eliquis and return for incision and drainage tomorrow.

## 2021-12-21 NOTE — Assessment & Plan Note (Signed)
Discussed removing obstacles and fall hazards and getting a fall button.  Information given to patient and son.

## 2021-12-21 NOTE — Assessment & Plan Note (Signed)
Wife designated if patient were incapacitated.  

## 2021-12-21 NOTE — Assessment & Plan Note (Signed)
Continue Crestor.  See notes on labs. 

## 2021-12-21 NOTE — Assessment & Plan Note (Signed)
See notes on labs.  Continue Januvia glipizide and insulin.

## 2021-12-21 NOTE — Assessment & Plan Note (Signed)
See notes on plain films given recent fall.

## 2021-12-21 NOTE — Assessment & Plan Note (Signed)
Wife designated if patient were incapacitated.   Vaccines d/w pt.   covid vaccine done shingrix prev done.   Defer colon and prostate cancer screening at this point, patient agrees.

## 2021-12-23 LAB — ALKALINE PHOSPHATASE, ISOENZYMES
Alkaline Phosphatase: 650 IU/L — ABNORMAL HIGH (ref 44–121)
BONE FRACTION: 37 % (ref 12–68)
INTESTINAL FRAC.: 7 % (ref 0–18)
LIVER FRACTION: 56 % (ref 13–88)

## 2021-12-26 ENCOUNTER — Other Ambulatory Visit: Payer: Self-pay | Admitting: Family Medicine

## 2021-12-26 DIAGNOSIS — S32010D Wedge compression fracture of first lumbar vertebra, subsequent encounter for fracture with routine healing: Secondary | ICD-10-CM

## 2021-12-26 DIAGNOSIS — R748 Abnormal levels of other serum enzymes: Secondary | ICD-10-CM

## 2021-12-27 ENCOUNTER — Encounter: Payer: Self-pay | Admitting: Family Medicine

## 2022-01-02 ENCOUNTER — Ambulatory Visit
Admission: RE | Admit: 2022-01-02 | Discharge: 2022-01-02 | Disposition: A | Discharge status: Home / Self Care | Payer: Medicare Other | Source: Ambulatory Visit | Attending: Family Medicine | Admitting: Family Medicine

## 2022-01-02 DIAGNOSIS — R748 Abnormal levels of other serum enzymes: Secondary | ICD-10-CM | POA: Diagnosis present

## 2022-01-02 DIAGNOSIS — S32010D Wedge compression fracture of first lumbar vertebra, subsequent encounter for fracture with routine healing: Secondary | ICD-10-CM | POA: Diagnosis present

## 2022-01-04 ENCOUNTER — Other Ambulatory Visit: Payer: Self-pay | Admitting: Family Medicine

## 2022-01-04 DIAGNOSIS — R7989 Other specified abnormal findings of blood chemistry: Secondary | ICD-10-CM

## 2022-01-04 DIAGNOSIS — R748 Abnormal levels of other serum enzymes: Secondary | ICD-10-CM

## 2022-01-05 ENCOUNTER — Encounter: Payer: Self-pay | Admitting: Gastroenterology

## 2022-01-06 ENCOUNTER — Other Ambulatory Visit (INDEPENDENT_AMBULATORY_CARE_PROVIDER_SITE_OTHER): Payer: Medicare Other

## 2022-01-06 DIAGNOSIS — S32010D Wedge compression fracture of first lumbar vertebra, subsequent encounter for fracture with routine healing: Secondary | ICD-10-CM

## 2022-01-06 DIAGNOSIS — R748 Abnormal levels of other serum enzymes: Secondary | ICD-10-CM

## 2022-01-06 LAB — COMPREHENSIVE METABOLIC PANEL
ALT: 56 U/L — ABNORMAL HIGH (ref 0–53)
AST: 59 U/L — ABNORMAL HIGH (ref 0–37)
Albumin: 3.5 g/dL (ref 3.5–5.2)
Alkaline Phosphatase: 397 U/L — ABNORMAL HIGH (ref 39–117)
BUN: 32 mg/dL — ABNORMAL HIGH (ref 6–23)
CO2: 27 mEq/L (ref 19–32)
Calcium: 9.3 mg/dL (ref 8.4–10.5)
Chloride: 108 mEq/L (ref 96–112)
Creatinine, Ser: 2.57 mg/dL — ABNORMAL HIGH (ref 0.40–1.50)
GFR: 21.01 mL/min — ABNORMAL LOW (ref >60.00)
Glucose, Bld: 130 mg/dL — ABNORMAL HIGH (ref 70–99)
Potassium: 5.6 mEq/L — ABNORMAL HIGH (ref 3.5–5.1)
Sodium: 147 mEq/L — ABNORMAL HIGH (ref 135–145)
Total Bilirubin: 0.7 mg/dL (ref 0.2–1.2)
Total Protein: 6.3 g/dL (ref 6.0–8.3)

## 2022-01-08 ENCOUNTER — Other Ambulatory Visit: Payer: Self-pay | Admitting: Family Medicine

## 2022-01-08 DIAGNOSIS — R7989 Other specified abnormal findings of blood chemistry: Secondary | ICD-10-CM

## 2022-01-08 MED ORDER — LOKELMA 5 G PO PACK
5.0000 g | PACK | Freq: Two times a day (BID) | ORAL | 0 refills | Status: DC
Start: 2022-01-08 — End: 2022-01-12

## 2022-01-12 ENCOUNTER — Telehealth: Payer: Self-pay | Admitting: Family Medicine

## 2022-01-12 NOTE — Telephone Encounter (Signed)
Patient called in stating that medication he was prescribed was making him sick and he stopped taking it. Stated he would like for someone to give him a call back as soon as possible. Thank you!

## 2022-01-12 NOTE — Addendum Note (Signed)
Addended by: Tonia Ghent on: 01/12/2022 04:41 PM   Modules accepted: Orders

## 2022-01-12 NOTE — Telephone Encounter (Addendum)
Noted.  I wasn't expecting that and I agree about stopping the med.  I updated his allergy list.  I would get the labs and then go from there.  Thanks.

## 2022-01-12 NOTE — Telephone Encounter (Signed)
Called patient and he stated the [facility] that was prescribed last week made him sick. He took 2 doses and still has two other doses to take but he states he's not taking them. He was throwing up and had chills Saturday after taking a dose Friday night and Saturday morning. He's okay now and states he does not want to go thru that again so hes not finishing the doses. He will return here tomorrow for his lab work.

## 2022-01-13 ENCOUNTER — Other Ambulatory Visit (INDEPENDENT_AMBULATORY_CARE_PROVIDER_SITE_OTHER): Payer: Medicare Other

## 2022-01-13 DIAGNOSIS — R7989 Other specified abnormal findings of blood chemistry: Secondary | ICD-10-CM | POA: Diagnosis not present

## 2022-01-13 LAB — COMPREHENSIVE METABOLIC PANEL
ALT: 112 U/L — ABNORMAL HIGH (ref 0–53)
AST: 116 U/L — ABNORMAL HIGH (ref 0–37)
Albumin: 3.3 g/dL — ABNORMAL LOW (ref 3.5–5.2)
Alkaline Phosphatase: 473 U/L — ABNORMAL HIGH (ref 39–117)
BUN: 33 mg/dL — ABNORMAL HIGH (ref 6–23)
CO2: 26 mEq/L (ref 19–32)
Calcium: 8.6 mg/dL (ref 8.4–10.5)
Chloride: 107 mEq/L (ref 96–112)
Creatinine, Ser: 2.49 mg/dL — ABNORMAL HIGH (ref 0.40–1.50)
GFR: 21.82 mL/min — ABNORMAL LOW (ref >60.00)
Glucose, Bld: 56 mg/dL — ABNORMAL LOW (ref 70–99)
Potassium: 4.5 mEq/L (ref 3.5–5.1)
Sodium: 141 mEq/L (ref 135–145)
Total Bilirubin: 2.9 mg/dL — ABNORMAL HIGH (ref 0.2–1.2)
Total Protein: 6.2 g/dL (ref 6.0–8.3)

## 2022-01-15 ENCOUNTER — Telehealth: Payer: Self-pay | Admitting: *Deleted

## 2022-01-15 ENCOUNTER — Encounter: Payer: Self-pay | Admitting: *Deleted

## 2022-01-15 NOTE — Telephone Encounter (Signed)
Patient is scheduled for US Abdomen on Monday 01/19/22  Nothing further needed.

## 2022-01-15 NOTE — Telephone Encounter (Signed)
-----   Message from Tonia Ghent, MD sent at 01/14/2022 10:55 PM EDT ----- Please see the result note on his liver test.  He had an ultrasound of his liver ordered on 12/26/21.  I do not see where it scheduled yet.  When is this going to happen?  Please let me know.  If it is not scheduled for the next day or two then I wanted to get change it to a stat.  I routed the result note to [place].  Thanks.  Brigitte Pulse

## 2022-01-15 NOTE — Telephone Encounter (Signed)
After review of the chart, I am not the referral coordinator that worked on scheduling this scan. It looks like a MyChart message was sent to the patient on 12/26/2021 to call and schedule both these appts, patient son read the Mychart message on 01/03/2022. I spoke with the patient and he states that his Son called and scheduled only the CT and must not have scheduled the [place]. He stated that when they went to get the CT done they mentioned to the Scanning tech that he also needed an US Abdomen and they told him they could see the order in the computer but there was no appt scheduled. I am not sure why no one reached out to Dr [facility] about that while the patient was there.   The patient requested that I send a MyChart message to his account so that his son would get it and could call to schedule. I sent the MyChart message. The appt should be scheduled ASAP. The patient will follow up if he has any further questions or concerns.

## 2022-01-16 NOTE — Telephone Encounter (Signed)
Noted. Thanks.

## 2022-01-19 ENCOUNTER — Other Ambulatory Visit: Payer: Self-pay | Admitting: Family Medicine

## 2022-01-19 ENCOUNTER — Ambulatory Visit
Admission: RE | Admit: 2022-01-19 | Discharge: 2022-01-19 | Disposition: A | Discharge status: Home / Self Care | Payer: Medicare Other | Source: Ambulatory Visit | Attending: Family Medicine | Admitting: Family Medicine

## 2022-01-19 ENCOUNTER — Telehealth: Payer: Self-pay | Admitting: Gastroenterology

## 2022-01-19 DIAGNOSIS — R748 Abnormal levels of other serum enzymes: Secondary | ICD-10-CM | POA: Insufficient documentation

## 2022-01-19 DIAGNOSIS — R7989 Other specified abnormal findings of blood chemistry: Secondary | ICD-10-CM

## 2022-01-19 DIAGNOSIS — K8020 Calculus of gallbladder without cholecystitis without obstruction: Secondary | ICD-10-CM

## 2022-01-19 DIAGNOSIS — S32010D Wedge compression fracture of first lumbar vertebra, subsequent encounter for fracture with routine healing: Secondary | ICD-10-CM | POA: Diagnosis present

## 2022-01-19 NOTE — Telephone Encounter (Signed)
Left detailed message for patient to call back. Rescheduled for tomorrow 01/20/22 at 1:30 pm with Nevin Bloodgood, NP. He has acknowledged appointment alert in mychart.

## 2022-01-19 NOTE — Telephone Encounter (Signed)
Spoke with PCP office & made them aware of appointment that has been made.

## 2022-01-19 NOTE — Telephone Encounter (Signed)
Inbound call from [place] at [place] stating patient has an urgent referral in for elevated LFT and gall stones. States provider would like for patient to be seen as soon as possible due to age. Patient scheduled for upcoming apt sept 13 with a PA. There is no sooner apt on the schedule. Please give a call to further advise.  Thank you

## 2022-01-20 ENCOUNTER — Other Ambulatory Visit (INDEPENDENT_AMBULATORY_CARE_PROVIDER_SITE_OTHER): Payer: Medicare Other

## 2022-01-20 ENCOUNTER — Ambulatory Visit: Payer: Medicare Other | Admitting: Nurse Practitioner

## 2022-01-20 ENCOUNTER — Telehealth: Payer: Self-pay

## 2022-01-20 ENCOUNTER — Encounter: Payer: Self-pay | Admitting: Nurse Practitioner

## 2022-01-20 VITALS — BP 140/68 | HR 71 | Ht 71.0 in | Wt 186.4 lb

## 2022-01-20 DIAGNOSIS — K831 Obstruction of bile duct: Secondary | ICD-10-CM

## 2022-01-20 DIAGNOSIS — I639 Cerebral infarction, unspecified: Secondary | ICD-10-CM

## 2022-01-20 DIAGNOSIS — I480 Paroxysmal atrial fibrillation: Secondary | ICD-10-CM

## 2022-01-20 DIAGNOSIS — K8050 Calculus of bile duct without cholangitis or cholecystitis without obstruction: Secondary | ICD-10-CM | POA: Diagnosis not present

## 2022-01-20 LAB — CBC
HCT: 40.5 % (ref 39.0–52.0)
Hemoglobin: 13.6 g/dL (ref 13.0–17.0)
MCHC: 33.6 g/dL (ref 30.0–36.0)
MCV: 92.4 fl (ref 78.0–100.0)
Platelets: 146 10*3/uL — ABNORMAL LOW (ref 150.0–400.0)
RBC: 4.38 Mil/uL (ref 4.22–5.81)
RDW: 14.9 % (ref 11.5–15.5)
WBC: 7.9 10*3/uL (ref 4.0–10.5)

## 2022-01-20 NOTE — Telephone Encounter (Addendum)
[facility] Pre-operative Risk Assessment     Request for surgical clearance:     Endoscopy Procedure  What type of surgery is being performed?     ERCP  When is this surgery scheduled?   01-27-2022  What type of clearance is required ?   Pharmacy  Are there any medications that need to be held prior to surgery and how long? Eliquis 4 day hold due to renal failure  Practice name and name of physician performing surgery?      [facility]  What is your office phone and fax number?      Phone- [phone]  [phone]  Anesthesia type (None, local, MAC, general) ?       MAC

## 2022-01-20 NOTE — Patient Instructions (Addendum)
_______________________________________________________  If you are age 86 or older, your body mass index should be between 23-30. Your Body mass index is 25.99 kg/m. If this is out of the aforementioned range listed, please consider follow up with your Primary Care Provider.  If you are age 3 or younger, your body mass index should be between 19-25. Your Body mass index is 25.99 kg/m. If this is out of the aformentioned range listed, please consider follow up with your Primary Care Provider.   ________________________________________________________  The [place] providers would like to encourage you to use [facility] to communicate with providers for non-urgent requests or questions.  Due to long hold times on the telephone, sending your provider a message by [facility] may be a faster and more efficient way to get a response.  Please allow 48 business hours for a response.  Please remember that this is for non-urgent requests.  _______________________________________________________  ERCP scheduled for 01-27-2022.  Please call Thursday morning 01-22-2022 regarding the Eliquis if you haven't heard anything from the office.  Please call with any questions or concerns.  It was a pleasure to see you today!  Thank you for trusting me with your gastrointestinal care!

## 2022-01-20 NOTE — Addendum Note (Signed)
Addended by: Villa Herb on: 01/20/2022 03:39 PM   Modules accepted: Orders

## 2022-01-20 NOTE — Telephone Encounter (Signed)
Will route to PharmD for rec's re: holding anticoagulation. Richardson Dopp, PA-C    01/20/2022 4:39 PM

## 2022-01-20 NOTE — Progress Notes (Signed)
  Chief Complaint:  abnormal liver tests   Assessment & Plan   # 88-year-old male multiple co-morbidities with choledocholithiasis.  He is s/p remote cholecystectomy. He has elevated liver chemistries and marked CBD dilation on US.  CBD stones visualized on recent lumbar spine CT.  He will need an ERCP. The benefits and risks of ERCP with possible sphincterotomy not limited to cardiopulmonary complications of sedation, bleeding, infection, perforation,and pancreatitis were discussed with the patient who agrees to proceed.   Non-toxic appearing. No fevers / chills at home. Will get CBC today  # Atrial fibrillation, on Eliquis.  Hold Eliquis for 4 days before procedure ( based on calculated creatinine clearance of 23 ml / min).  Will instruct when and how to resume after procedure. Patient understands that there is a low but real risk of cardiovascular event such as heart attack, stroke, or embolism /  thrombosis while off blood thinner. The patient consents to proceed. Will communicate by phone or EMR with patient's prescribing provider to confirm that holding Eliquis is reasonable in this case.   # Infected knee replacement, on chronic Amoxicillin   # Thrombocytopenia, chronic intermittent. Platelets stable at 146.   HPI:    Dean Murphy is a 88 y.o. year old male known to Dr. Stark with a past medical history of CKD 3b, DM2, PVD, CVA, CAD / CABG, cardiac pacemaker, Afib on Eliquis, GERD, colon polyps, glaucoma, L1 and L2 compression fractures. . See PMH / PSH for additional history.   Patient was last seen in 2016.  He has been referred by PCP for possible common bile duct stones / elevated liver tests.  He is here with his sons  He has been seeing PCP for recent fall and vertebral fracture. Labs in July were notable for abnormal liver chemistries. They have since been repeat a few times and have remain abnormal. RUQ >> dilated CBD, suggestion of CBD stones. A CT of lumbar spine on 8/11  showed CBD stones. He hasn't been having any upper abdominal pain. He had some recent nausea / vomiting. He has been struggling with intermittent generalized lower abdominal pain. He has intermittent constipation. He takes Miralax as needed. He hasn't tried to take it on a daily basis. He is seeing his PCP next week about a possible inguinal hernia.   12/18/21 Alkaline phosphatase 581 , total bilirubin normal, AST 93 / ALT 65  01/06/22 Alk phos 397, AST 59 /ALT 56, Tbili 0.7  01/13/22 Alkaline phosphatase 473, AST 116 / ALT 112. Tbili 2.9    Previous Labs / Imaging::    Latest Ref Rng & Units 12/18/2021    9:37 AM 08/07/2021    9:51 AM 12/17/2020   11:49 AM  CBC  WBC 4.0 - 10.5 K/uL 7.8  7.6  8.6   Hemoglobin 13.0 - 17.0 g/dL 13.4  14.0  13.5   Hematocrit 39.0 - 52.0 % 40.7  42.5  40.0   Platelets 150.0 - 400.0 K/uL 129.0  167  139.0     No results found for: "LIPASE"    Latest Ref Rng & Units 01/13/2022    9:13 AM 01/06/2022    9:52 AM 12/19/2021    4:12 PM  CMP  Glucose 70 - 99 mg/dL 56  130    BUN 6 - 23 mg/dL 33  32    Creatinine 0.40 - 1.50 mg/dL 2.49  2.57    Sodium 135 - 145 mEq/L 141  147      Potassium 3.5 - 5.1 mEq/L 4.5  5.6    Chloride 96 - 112 mEq/L 107  108    CO2 19 - 32 mEq/L 26  27    Calcium 8.4 - 10.5 mg/dL 8.6  9.3    Total Protein 6.0 - 8.3 g/dL 6.2  6.3    Total Bilirubin 0.2 - 1.2 mg/dL 2.9  0.7    Alkaline Phos 39 - 117 U/L 473  397  650   AST 0 - 37 U/L 116  59    ALT 0 - 53 U/L 112  56      Previous GI Evaluation   Colonoscopy May 2016 Six sessile polyps in the descending, sigmoid, ascending and transverse colon; polypectomies performed with a cold Snare. Sessile polyp in the ascending colon; polypectomy performed using snare cautery. Moderate diverticulosis noted in the sigmoid colon and descending colon. External hemorrhoids  Surgical [P], ascending x 3, transverse x 1, descending x 1, sigmoid x 2, polyp (7) - TUBULAR ADENOMA(S). - HIGH GRADE  DYSPLASIA IS NOT IDENTIFIED.  **No recall due to age.   Imaging:  US ABDOMEN LIMITED RUQ (LIVER/GB) CLINICAL DATA:  Elevated LFTs.  EXAM: ULTRASOUND ABDOMEN LIMITED RIGHT UPPER QUADRANT  COMPARISON:  CT abdomen pelvis 06/26/2009  FINDINGS: Gallbladder:  Surgically absent  Common bile duct:  Diameter: Markedly distended measuring 17 mm. Suggestion of multiple stones within the distal common bile duct.  Liver:  Increased echogenicity. No focal lesion. Portal vein is patent on color Doppler imaging with normal direction of blood flow towards the liver.  Other: None.  IMPRESSION: Common bile duct is markedly distended measuring 17 mm with suggestion of possible distal common bile duct stones. In the setting of abnormal LFTs, recommend dedicated evaluation with MRI/MRCP.  Increased hepatic parenchymal echogenicity suggestive of steatosis.  These results will be called to the ordering clinician or representative by the Radiologist Assistant, and communication documented in the PACS or [facility].  Electronically Signed   By: Drew  Davis M.D.   On: 01/19/2022 10:02    Past Medical History:  Diagnosis Date   Anemia    Arthritis    CAD (coronary artery disease)    a. CABG 1989, b. Myoview low risk 2012.   Carotid artery occlusion    a. Duplex 10/2014: patent R/L CEA with mild hyperplasia in right surgical bulb - followed by vascular.   CHB (complete heart block) (HCC) March 2016   a. s/p STJ dual chamber pacemaker 07/2014.   Chronic diastolic CHF (congestive heart failure) (HCC)    a. Dx 07/2014 - acute diastolic CHF in the setting of CHB.   Chronic kidney disease, stage IV (severe) (HCC)    stage III/IV as of 2015, Dr Sing Nephologist   Colon polyps    Complication of anesthesia    Constipation    CVA (cerebral infarction)    Diabetes mellitus    type II   Diverticulosis    Dupuytren's disease    finger right hand contracted   Essential hypertension     GERD (gastroesophageal reflux disease)    Hemorrhoids    Hyperlipidemia    Myocardial infarction (HCC) 1976  and Mar. 19, 2016   Orthostasis    Osteoporosis    PAF (paroxysmal atrial fibrillation) (HCC) March 2016   PONV (postoperative nausea and vomiting)    Presence of permanent cardiac pacemaker    Shortness of breath dyspnea    due to pain   Past Surgical History:  Procedure   Laterality Date   CARDIAC CATHETERIZATION  08/11/2014   Procedure: TEMPORARY PACEMAKER;  Surgeon: Jonathan J Berry, MD;  Location: [facility]ATH [facility];  Service: Cardiovascular;;   CAROTID ENDARTERECTOMY  12/31/10   Right   CAROTID ENDARTERECTOMY  02/09/11   left   CHOLECYSTECTOMY     CORONARY ARTERY BYPASS GRAFT  1989   EYE SURGERY Bilateral    Cataract with implants   I & D KNEE WITH POLY EXCHANGE Left 07/29/2015   Procedure: IRRIGATION AND DEBRIDEMENT LEFT KNEE WITH POLY EXCHANGE;  Surgeon: Brian Swinteck, MD;  Location: [facility] OR;  Service: Orthopedics;  Laterality: Left;   IR GENERIC HISTORICAL  03/04/2016   IR US GUIDE VASC ACCESS RIGHT 03/04/2016 Arthur Hoss, MD [facility]   IR GENERIC HISTORICAL  03/04/2016   IR FLUORO GUIDE CV LINE RIGHT 03/04/2016 Arthur Hoss, MD [facility]   JOINT REPLACEMENT     bilat. knees   PERMANENT PACEMAKER INSERTION N/A 08/13/2014   STJ dual chamber pacemaker implanted by Dr Taylor for CHB   TONSILLECTOMY     TOTAL KNEE ARTHROPLASTY     bilateral   Family History  Problem Relation Age of Onset   Heart disease Mother        Before age 60   Diabetes Mother    Varicose Veins Mother    Heart attack Mother    Heart attack Father    Heart disease Father        After age 60   Hypertension Father    Heart disease Brother        Before age 60   Hypertension Brother    Heart attack Brother    Diabetes Son    Hypertension Son    Hypertension Son    Diabetes Son    Diabetes Other    Cancer Other    Colon cancer Neg Hx    Social History   Tobacco Use   Smoking  status: Former    Packs/day: 1.00    Years: 25.00    Total pack years: 25.00    Types: Cigarettes    Quit date: 01/21/1973    Years since quitting: 49.0   Smokeless tobacco: Never   Tobacco comments:    began smoking in high school, quit age 46  Vaping Use   Vaping Use: Never used  Substance Use Topics   Alcohol use: Yes    Alcohol/week: 0.0 standard drinks of alcohol    Comment: wine occ   Drug use: No   Current Outpatient Medications  Medication Sig Dispense Refill   amoxicillin (AMOXIL) 500 MG capsule Take 1 capsule (500 mg total) by mouth 2 (two) times daily. 180 capsule 3   B-D UF III MINI PEN NEEDLES 31G X 5 MM MISC USE DAILY WITH INSULIN PEN 100 each 4   clotrimazole-betamethasone (LOTRISONE) cream APPLY TO AFFECTED AREA TWICE A DAY 30 g 0   dorzolamide-timolol (COSOPT) 22.3-6.8 MG/ML ophthalmic solution 1 drop daily in the afternoon. In both eye     ELIQUIS 2.5 MG TABS tablet TAKE 1 TABLET BY MOUTH TWICE A DAY 180 tablet 1   ferrous sulfate 325 (65 FE) MG tablet TAKE 1 TABLET BY MOUTH ON FRIDAY     glipiZIDE (GLUCOTROL) 5 MG tablet TAKE 2 TABLETS BY MOUTH EVERY MORNING BEFORE BREAKFAST AND 1 TABLET BEFORE SUPPER 270 tablet 3   insulin glargine (LANTUS SOLOSTAR) 100 UNIT/ML Solostar Pen Inject 34 Units into the skin daily.     JANUVIA 50   MG tablet TAKE 1 TABLET BY MOUTH EVERY DAY 90 tablet 2   losartan (COZAAR) 100 MG tablet Take 1 tablet (100 mg total) by mouth daily.     lubiprostone (AMITIZA) 8 MCG capsule TAKE ONE CAPSULE BY MOUTH EACH MORNING 90 capsule 1   metoprolol succinate (TOPROL XL) 25 MG 24 hr tablet Take 1 tablet (25 mg total) by mouth at bedtime. 90 tablet 3   omeprazole (PRILOSEC) 40 MG capsule Take 1 capsule (40 mg total) by mouth every Monday, Wednesday, and Friday.     rosuvastatin (CRESTOR) 20 MG tablet Take 1 tablet (20 mg total) by mouth daily. 90 tablet 3   torsemide (DEMADEX) 10 MG tablet Take 1 tablet (10 mg total) by mouth 2 (two) times a week. Monday  and Friday     HYDROcodone-acetaminophen (NORCO/VICODIN) 5-325 MG tablet Take 0.5-1 tablets by mouth 3 (three) times daily as needed (for back pain). (Patient not taking: Reported on 01/20/2022) 15 tablet 0   polyethylene glycol powder (GLYCOLAX/MIRALAX) 17 GM/SCOOP powder Take 17 g by mouth daily as needed for mild constipation or moderate constipation. (Patient not taking: Reported on 01/20/2022)     No current facility-administered medications for this visit.   Allergies  Allergen Reactions   Zoledronic Acid Other (See Comments)    Stopped 2015 due to Creatine  Other reaction(s): Unknown Stopped 2015 due to Cr   Lokelma [Sodium Zirconium Cyclosilicate]     Nausea/vomiting   Nsaids Other (See Comments)    Held due to creatinine elevation.       Review of Systems: Positive for back pain post fall, hearing problems, urine leakage.  All other systems reviewed and negative except where noted in HPI.   Wt Readings from Last 3 Encounters:  01/20/22 186 lb 6 oz (84.5 kg)  12/19/21 189 lb (85.7 kg)  12/18/21 188 lb (85.3 kg)    Physical Exam   Ht 5' 11" (1.803 m)   Wt 186 lb 6 oz (84.5 kg)   BMI 25.99 kg/m  Constitutional:  Generally well appearing elderly male in no acute distress. Psychiatric: Pleasant. Normal mood and affect. Behavior is normal. EENT: HOH. Pupils pinpoint.   Conjunctivae are normal. No scleral icterus. Neck supple.  Cardiovascular: Normal rate, regular rhythm. No edema Pulmonary/chest: Effort normal and breath sounds normal. No wheezing, rales or rhonchi. Abdominal: Soft, nondistended, nontender. Bowel sounds active throughout. There are no masses palpable. No hepatomegaly. Neurological: Alert and oriented to person place and time. Skin: Skin is warm and dry. Scattered ecchymosis  Tye Savoy, NP  01/20/2022, 1:15 PM  Cc:  Referring Provider Tonia Ghent, MD

## 2022-01-20 NOTE — H&P (View-Only) (Signed)
Chief Complaint:  abnormal liver tests   Assessment & Plan   # 88 year old male multiple co-morbidities with choledocholithiasis.  He is s/p remote cholecystectomy. He has elevated liver chemistries and marked CBD dilation on [place].  CBD stones visualized on recent lumbar spine CT.  He will need an ERCP. The benefits and risks of ERCP with possible sphincterotomy not limited to cardiopulmonary complications of sedation, bleeding, infection, perforation,and pancreatitis were discussed with the patient who agrees to proceed.   Non-toxic appearing. No fevers / chills at home. Will get CBC today  # Atrial fibrillation, on Eliquis.  Hold Eliquis for 4 days before procedure ( based on calculated creatinine clearance of 23 ml / min).  Will instruct when and how to resume after procedure. Patient understands that there is a low but real risk of cardiovascular event such as heart attack, stroke, or embolism /  thrombosis while off blood thinner. The patient consents to proceed. Will communicate by phone or EMR with patient's prescribing provider to confirm that holding Eliquis is reasonable in this case.   # Infected knee replacement, on chronic Amoxicillin   # Thrombocytopenia, chronic intermittent. Platelets stable at 146.   HPI:    Dean Murphy is a 88 y.o. year old male known to Dr. Fuller Plan with a past medical history of CKD 3b, DM2, PVD, CVA, CAD / CABG, cardiac pacemaker, Afib on Eliquis, GERD, colon polyps, glaucoma, L1 and L2 compression fractures. . See PMH / [place] for additional history.   Patient was last seen in 2016.  He has been referred by PCP for possible common bile duct stones / elevated liver tests.  He is here with his sons  He has been seeing PCP for recent fall and vertebral fracture. Labs in July were notable for abnormal liver chemistries. They have since been repeat a few times and have remain abnormal. RUQ >> dilated CBD, suggestion of CBD stones. A CT of lumbar spine on 8/11  showed CBD stones. He hasn't been having any upper abdominal pain. He had some recent nausea / vomiting. He has been struggling with intermittent generalized lower abdominal pain. He has intermittent constipation. He takes Miralax as needed. He hasn't tried to take it on a daily basis. He is seeing his PCP next week about a possible inguinal hernia.   12/18/21 Alkaline phosphatase 581 , total bilirubin normal, AST 93 / ALT 65  01/06/22 Alk phos 397, AST 59 /ALT 56, Tbili 0.7  01/13/22 Alkaline phosphatase 473, AST 116 / ALT 112. Tbili 2.9    Previous Labs / Imaging::    Latest Ref Rng & Units 12/18/2021    9:37 AM 08/07/2021    9:51 AM 12/17/2020   11:49 AM  CBC  WBC 4.0 - 10.5 K/uL 7.8  7.6  8.6   Hemoglobin 13.0 - 17.0 g/dL 13.4  14.0  13.5   Hematocrit 39.0 - 52.0 % 40.7  42.5  40.0   Platelets 150.0 - 400.0 K/uL 129.0  167  139.0     No results found for: "LIPASE"    Latest Ref Rng & Units 01/13/2022    9:13 AM 01/06/2022    9:52 AM 12/19/2021    4:12 PM  CMP  Glucose 70 - 99 mg/dL 56  130    BUN 6 - 23 mg/dL 33  32    Creatinine 0.40 - 1.50 mg/dL 2.49  2.57    Sodium 135 - 145 mEq/L 141  147  Potassium 3.5 - 5.1 mEq/L 4.5  5.6    Chloride 96 - 112 mEq/L 107  108    CO2 19 - 32 mEq/L 26  27    Calcium 8.4 - 10.5 mg/dL 8.6  9.3    Total Protein 6.0 - 8.3 g/dL 6.2  6.3    Total Bilirubin 0.2 - 1.2 mg/dL 2.9  0.7    Alkaline Phos 39 - 117 U/L 473  397  650   AST 0 - 37 U/L 116  59    ALT 0 - 53 U/L 112  56      Previous GI Evaluation   Colonoscopy May 2016 Six sessile polyps in the descending, sigmoid, ascending and transverse colon; polypectomies performed with a cold Snare. Sessile polyp in the ascending colon; polypectomy performed using snare cautery. Moderate diverticulosis noted in the sigmoid colon and descending colon. External hemorrhoids  Surgical [P], ascending x 3, transverse x 1, descending x 1, sigmoid x 2, polyp (7) - TUBULAR ADENOMA(S). - HIGH GRADE  DYSPLASIA IS NOT IDENTIFIED.  **No recall due to age.   Imaging:  US ABDOMEN LIMITED RUQ (LIVER/GB) CLINICAL DATA:  Elevated LFTs.  EXAM: ULTRASOUND ABDOMEN LIMITED RIGHT UPPER QUADRANT  COMPARISON:  CT abdomen pelvis 06/26/2009  FINDINGS: Gallbladder:  Surgically absent  Common bile duct:  Diameter: Markedly distended measuring 17 mm. Suggestion of multiple stones within the distal common bile duct.  Liver:  Increased echogenicity. No focal lesion. Portal vein is patent on color Doppler imaging with normal direction of blood flow towards the liver.  Other: None.  IMPRESSION: Common bile duct is markedly distended measuring 17 mm with suggestion of possible distal common bile duct stones. In the setting of abnormal LFTs, recommend dedicated evaluation with MRI/MRCP.  Increased hepatic parenchymal echogenicity suggestive of steatosis.  These results will be called to the ordering clinician or representative by the Radiologist Assistant, and communication documented in the PACS or [employer].  Electronically Signed   By: Lovey Newcomer M.D.   On: 01/19/2022 10:02    Past Medical History:  Diagnosis Date   Anemia    Arthritis    CAD (coronary artery disease)    a. CABG 1989, b. Myoview low risk 2012.   Carotid artery occlusion    a. Duplex 10/2014: patent R/L CEA with mild hyperplasia in right surgical bulb - followed by vascular.   CHB (complete heart block) [facility]) March 2016   a. s/p STJ dual chamber pacemaker 07/2014.   Chronic diastolic CHF (congestive heart failure) ([place])    a. Dx 10/6061 - acute diastolic CHF in the setting of CHB.   Chronic kidney disease, stage IV (severe) (HCC)    stage III/IV as of 2015, [name]   Colon polyps    Complication of anesthesia    Constipation    CVA (cerebral infarction)    Diabetes mellitus    type II   Diverticulosis    Dupuytren's disease    finger right hand contracted   Essential hypertension     GERD (gastroesophageal reflux disease)    Hemorrhoids    Hyperlipidemia    Myocardial infarction ([place]) 1976  and Mar. 19, 2016   Orthostasis    Osteoporosis    PAF (paroxysmal atrial fibrillation) ([place]) March 2016   PONV (postoperative nausea and vomiting)    Presence of permanent cardiac pacemaker    Shortness of breath dyspnea    due to pain   Past Surgical History:  Procedure  Laterality Date   CARDIAC CATHETERIZATION  08/11/2014   Procedure: TEMPORARY PACEMAKER;  Surgeon: [name], MD;  Location: [facility] CATH LAB;  Service: Cardiovascular;;   CAROTID ENDARTERECTOMY  12/31/10   Right   CAROTID ENDARTERECTOMY  02/09/11   left   CHOLECYSTECTOMY     CORONARY ARTERY BYPASS GRAFT  1989   EYE SURGERY Bilateral    Cataract with implants   I & D KNEE WITH POLY EXCHANGE Left 07/29/2015   Procedure: IRRIGATION AND DEBRIDEMENT LEFT KNEE WITH POLY EXCHANGE;  Surgeon: Rod Can, MD;  Location: [facility];  Service: Orthopedics;  Laterality: Left;   IR GENERIC HISTORICAL  03/04/2016   IR US GUIDE VASC ACCESS RIGHT 03/04/2016 Marybelle Killings, MD [facility]   IR GENERIC HISTORICAL  03/04/2016   IR FLUORO GUIDE CV LINE RIGHT 03/04/2016 Marybelle Killings, MD [facility]   JOINT REPLACEMENT     bilat. knees   PERMANENT PACEMAKER INSERTION N/A 08/13/2014   STJ dual chamber pacemaker implanted by [name] for CHB   TONSILLECTOMY     TOTAL KNEE ARTHROPLASTY     bilateral   Family History  Problem Relation Age of Onset   Heart disease Mother        Before age 16   Diabetes Mother    Varicose Veins Mother    Heart attack Mother    Heart attack Father    Heart disease Father        After age 20   Hypertension Father    Heart disease Brother        Before age 8   Hypertension Brother    Heart attack Brother    Diabetes Son    Hypertension Son    Hypertension Son    Diabetes Son    Diabetes Other    Cancer Other    Colon cancer Neg Hx    Social History   Tobacco Use   Smoking  status: Former    Packs/day: 1.00    Years: 25.00    Total pack years: 25.00    Types: Cigarettes    Quit date: 01/21/1973    Years since quitting: 49.0   Smokeless tobacco: Never   Tobacco comments:    began smoking in high school, quit age 62  Vaping Use   Vaping Use: Never used  Substance Use Topics   Alcohol use: Yes    Alcohol/week: 0.0 standard drinks of alcohol    Comment: wine occ   Drug use: No   Current Outpatient Medications  Medication Sig Dispense Refill   amoxicillin (AMOXIL) 500 MG capsule Take 1 capsule (500 mg total) by mouth 2 (two) times daily. 180 capsule 3   B-D UF III MINI PEN NEEDLES 31G X 5 MM MISC USE DAILY WITH INSULIN PEN 100 each 4   clotrimazole-betamethasone (LOTRISONE) cream APPLY TO AFFECTED AREA TWICE A DAY 30 g 0   dorzolamide-timolol (COSOPT) 22.3-6.8 MG/ML ophthalmic solution 1 drop daily in the afternoon. In both eye     ELIQUIS 2.5 MG TABS tablet TAKE 1 TABLET BY MOUTH TWICE A DAY 180 tablet 1   ferrous sulfate 325 (65 FE) MG tablet TAKE 1 TABLET BY MOUTH ON FRIDAY     glipiZIDE (GLUCOTROL) 5 MG tablet TAKE 2 TABLETS BY MOUTH EVERY MORNING BEFORE BREAKFAST AND 1 TABLET BEFORE SUPPER 270 tablet 3   insulin glargine (LANTUS SOLOSTAR) 100 UNIT/ML Solostar Pen Inject 34 Units into the skin daily.     JANUVIA 50  MG tablet TAKE 1 TABLET BY MOUTH EVERY DAY 90 tablet 2   losartan (COZAAR) 100 MG tablet Take 1 tablet (100 mg total) by mouth daily.     lubiprostone (AMITIZA) 8 MCG capsule TAKE ONE CAPSULE BY MOUTH EACH MORNING 90 capsule 1   metoprolol succinate (TOPROL XL) 25 MG 24 hr tablet Take 1 tablet (25 mg total) by mouth at bedtime. 90 tablet 3   omeprazole (PRILOSEC) 40 MG capsule Take 1 capsule (40 mg total) by mouth every Monday, Wednesday, and Friday.     rosuvastatin (CRESTOR) 20 MG tablet Take 1 tablet (20 mg total) by mouth daily. 90 tablet 3   torsemide (DEMADEX) 10 MG tablet Take 1 tablet (10 mg total) by mouth 2 (two) times a week. Monday  and Friday     HYDROcodone-acetaminophen (NORCO/VICODIN) 5-325 MG tablet Take 0.5-1 tablets by mouth 3 (three) times daily as needed (for back pain). (Patient not taking: Reported on 01/20/2022) 15 tablet 0   polyethylene glycol powder (GLYCOLAX/MIRALAX) 17 GM/SCOOP powder Take 17 g by mouth daily as needed for mild constipation or moderate constipation. (Patient not taking: Reported on 01/20/2022)     No current facility-administered medications for this visit.   Allergies  Allergen Reactions   Zoledronic Acid Other (See Comments)    Stopped 2015 due to Creatine  Other reaction(s): Unknown Stopped 2015 due to Cr   Lokelma [Sodium Zirconium Cyclosilicate]     Nausea/vomiting   Nsaids Other (See Comments)    Held due to creatinine elevation.       Review of Systems: Positive for back pain post fall, hearing problems, urine leakage.  All other systems reviewed and negative except where noted in HPI.   Wt Readings from Last 3 Encounters:  01/20/22 186 lb 6 oz (84.5 kg)  12/19/21 189 lb (85.7 kg)  12/18/21 188 lb (85.3 kg)    Physical Exam   Ht 5' 11" (1.803 m)   Wt 186 lb 6 oz (84.5 kg)   BMI 25.99 kg/m  Constitutional:  Generally well appearing elderly male in no acute distress. Psychiatric: Pleasant. Normal mood and affect. Behavior is normal. EENT: HOH. Pupils pinpoint.   Conjunctivae are normal. No scleral icterus. Neck supple.  Cardiovascular: Normal rate, regular rhythm. No edema Pulmonary/chest: Effort normal and breath sounds normal. No wheezing, rales or rhonchi. Abdominal: Soft, nondistended, nontender. Bowel sounds active throughout. There are no masses palpable. No hepatomegaly. Neurological: Alert and oriented to person place and time. Skin: Skin is warm and dry. Scattered ecchymosis  Dean Savoy, NP  01/20/2022, 1:15 PM  Cc:  Referring Provider Tonia Ghent, MD

## 2022-01-21 MED ORDER — ENOXAPARIN SODIUM 80 MG/0.8ML IJ SOSY: PREFILLED_SYRINGE | INTRAMUSCULAR | 0 refills | Status: DC

## 2022-01-21 NOTE — Telephone Encounter (Signed)
Agree. Would bridge him for the procedure given his CHADs-vasc 8 and stroke history.

## 2022-01-21 NOTE — Telephone Encounter (Addendum)
8/31 PM: Last dose of Eliquis in PM.  9/1: No Eliquis or enoxaparin (Lovenox).  9/2 AM : Inject enoxaparin 80 mg in the fatty abdominal tissue at least 2 inches from the belly button daily in AM. NO Eliquis.   9/3 AM: Inject enoxaparin in the fatty tissue daily in AM. NO Eliquis.   9/4 AM: Inject enoxaparin in the fatty tissue daily in AM. NO Eliquis.   9/5: No Lovenox. Restart Eliquis in PM or at discretion of surgeon.

## 2022-01-21 NOTE — Telephone Encounter (Addendum)
Patient with diagnosis of atrial fibrillation on Eliquis for anticoagulation.    Procedure: ERCP Date of procedure: 01/27/22   CHA2DS2-VASc Score = 8   This indicates a 10.8% annual risk of stroke. The patient's score is based upon: CHF History: 1 HTN History: 1 Diabetes History: 1 Stroke History: 2 Vascular Disease History: 1 Age Score: 2 Gender Score: 0   Remote small left cerebellar infarct noted on CT head 12/24/2014   CrCl 23 mL/min (Scr 2.49 01/13/22)  Platelet count 146K   Surgeon's office requesting 4 day Eliquis hold. Patient is high risk off anticoagulation due to elevated CHADS-VASc and hx of stroke.  Will route to Dr Johney Frame for input   **This guidance is not considered finalized until pre-operative APP has relayed final recommendations.**

## 2022-01-21 NOTE — Telephone Encounter (Signed)
   See notes. Pt will be bridged with Lovenox while off of Eliquis. Please resume Eliquis ASAP post op when safe. Richardson Dopp, PA-C    01/21/2022 4:11 PM

## 2022-01-21 NOTE — Addendum Note (Signed)
Addended by: Rollen Sox on: 01/21/2022 01:31 PM   Modules accepted: Orders

## 2022-01-21 NOTE — Telephone Encounter (Signed)
Called and spoke with patient and went over Lovenox Bridge directions.  Patient voiced understanding.  Will send Lovenox to pharmacy

## 2022-01-22 ENCOUNTER — Encounter (HOSPITAL_COMMUNITY): Payer: Self-pay | Admitting: Gastroenterology

## 2022-01-22 NOTE — Telephone Encounter (Signed)
Patient made aware and he knows about his instructions for meds and for prep of the procedure

## 2022-01-23 ENCOUNTER — Other Ambulatory Visit: Payer: Self-pay | Admitting: Family Medicine

## 2022-01-23 ENCOUNTER — Ambulatory Visit: Payer: Medicare Other | Admitting: Family Medicine

## 2022-01-23 ENCOUNTER — Encounter: Payer: Self-pay | Admitting: Family Medicine

## 2022-01-23 DIAGNOSIS — K4090 Unilateral inguinal hernia, without obstruction or gangrene, not specified as recurrent: Secondary | ICD-10-CM

## 2022-01-23 MED ORDER — LANTUS SOLOSTAR 100 UNIT/ML ~~LOC~~ SOPN: 34.0000 [IU] | PEN_INJECTOR | Freq: Every day | SUBCUTANEOUS | Status: DC

## 2022-01-23 NOTE — Progress Notes (Unsigned)
He'll change to lovenox bridge with ERCP pending, d/w pt.  Not jaundiced.  Previous GI note/plan discussed with patient.  R inguinal area, feels a mass.  Noted for about 6 months.  Not painful.  Variable size, can enlarge, then get smaller.  Never goes away.  Not ttp.    Prev CT 2011 with a small right inguinal hernia is seen containing only fat. Seen 2011.    Nad Ncat Neck supple, no LA RRR Ctab Abd soft, not ttp No right upper quadrant tenderness. R inguinal area, puffier than L side, no movement with cough.  Could feel pressure on palpation.  No testicle pain.

## 2022-01-23 NOTE — Patient Instructions (Signed)
I would get through the scope next week.   Then get through the follow up labs.   If significant pain/fever/troubles from the hernia, then go to the ER.   Take care.  Glad to see you.

## 2022-01-26 DIAGNOSIS — K4090 Unilateral inguinal hernia, without obstruction or gangrene, not specified as recurrent: Secondary | ICD-10-CM | POA: Insufficient documentation

## 2022-01-26 NOTE — Assessment & Plan Note (Signed)
He is likely feeling the hernia that was noted on CT in 2011.  Given his overall situation, I would get through the scope next week for biliary stones. Then it would make sense to get through the follow up labs, to make sure his liver tests normalized. If significant pain/fever/troubles from the hernia, then go to the ER.  Routine cautions given to patient.  He agrees with plan.

## 2022-01-27 ENCOUNTER — Ambulatory Visit (HOSPITAL_COMMUNITY): Payer: Medicare Other | Admitting: Certified Registered Nurse Anesthetist

## 2022-01-27 ENCOUNTER — Ambulatory Visit (HOSPITAL_COMMUNITY)
Admission: RE | Admit: 2022-01-27 | Discharge: 2022-01-27 | Disposition: A | Discharge status: Home / Self Care | Payer: Medicare Other | Attending: Gastroenterology | Admitting: Gastroenterology

## 2022-01-27 ENCOUNTER — Ambulatory Visit (HOSPITAL_COMMUNITY): Payer: Medicare Other

## 2022-01-27 ENCOUNTER — Encounter (HOSPITAL_COMMUNITY)
Admission: RE | Disposition: A | Discharge status: Home / Self Care | Payer: Self-pay | Source: Home / Self Care | Attending: Gastroenterology

## 2022-01-27 ENCOUNTER — Encounter (HOSPITAL_COMMUNITY): Payer: Self-pay | Admitting: Gastroenterology

## 2022-01-27 ENCOUNTER — Ambulatory Visit (HOSPITAL_BASED_OUTPATIENT_CLINIC_OR_DEPARTMENT_OTHER): Payer: Medicare Other | Admitting: Certified Registered Nurse Anesthetist

## 2022-01-27 ENCOUNTER — Ambulatory Visit (INDEPENDENT_AMBULATORY_CARE_PROVIDER_SITE_OTHER): Payer: Medicare Other

## 2022-01-27 ENCOUNTER — Other Ambulatory Visit: Payer: Self-pay

## 2022-01-27 DIAGNOSIS — Z8673 Personal history of transient ischemic attack (TIA), and cerebral infarction without residual deficits: Secondary | ICD-10-CM | POA: Diagnosis not present

## 2022-01-27 DIAGNOSIS — K8020 Calculus of gallbladder without cholecystitis without obstruction: Secondary | ICD-10-CM | POA: Diagnosis not present

## 2022-01-27 DIAGNOSIS — K5900 Constipation, unspecified: Secondary | ICD-10-CM | POA: Insufficient documentation

## 2022-01-27 DIAGNOSIS — K831 Obstruction of bile duct: Secondary | ICD-10-CM

## 2022-01-27 DIAGNOSIS — I442 Atrioventricular block, complete: Secondary | ICD-10-CM | POA: Diagnosis not present

## 2022-01-27 DIAGNOSIS — Y838 Other surgical procedures as the cause of abnormal reaction of the patient, or of later complication, without mention of misadventure at the time of the procedure: Secondary | ICD-10-CM | POA: Insufficient documentation

## 2022-01-27 DIAGNOSIS — K9186 Retained cholelithiasis following cholecystectomy: Secondary | ICD-10-CM | POA: Diagnosis present

## 2022-01-27 DIAGNOSIS — I2510 Atherosclerotic heart disease of native coronary artery without angina pectoris: Secondary | ICD-10-CM | POA: Insufficient documentation

## 2022-01-27 DIAGNOSIS — I129 Hypertensive chronic kidney disease with stage 1 through stage 4 chronic kidney disease, or unspecified chronic kidney disease: Secondary | ICD-10-CM | POA: Insufficient documentation

## 2022-01-27 DIAGNOSIS — I10 Essential (primary) hypertension: Secondary | ICD-10-CM

## 2022-01-27 DIAGNOSIS — Z7901 Long term (current) use of anticoagulants: Secondary | ICD-10-CM | POA: Insufficient documentation

## 2022-01-27 DIAGNOSIS — E1139 Type 2 diabetes mellitus with other diabetic ophthalmic complication: Secondary | ICD-10-CM | POA: Insufficient documentation

## 2022-01-27 DIAGNOSIS — Z950 Presence of cardiac pacemaker: Secondary | ICD-10-CM | POA: Insufficient documentation

## 2022-01-27 DIAGNOSIS — Z87891 Personal history of nicotine dependence: Secondary | ICD-10-CM | POA: Diagnosis not present

## 2022-01-27 DIAGNOSIS — Z951 Presence of aortocoronary bypass graft: Secondary | ICD-10-CM | POA: Diagnosis not present

## 2022-01-27 DIAGNOSIS — T8454XS Infection and inflammatory reaction due to internal left knee prosthesis, sequela: Secondary | ICD-10-CM | POA: Insufficient documentation

## 2022-01-27 DIAGNOSIS — E1122 Type 2 diabetes mellitus with diabetic chronic kidney disease: Secondary | ICD-10-CM | POA: Insufficient documentation

## 2022-01-27 DIAGNOSIS — H42 Glaucoma in diseases classified elsewhere: Secondary | ICD-10-CM | POA: Insufficient documentation

## 2022-01-27 DIAGNOSIS — Z792 Long term (current) use of antibiotics: Secondary | ICD-10-CM | POA: Insufficient documentation

## 2022-01-27 DIAGNOSIS — I4891 Unspecified atrial fibrillation: Secondary | ICD-10-CM | POA: Insufficient documentation

## 2022-01-27 DIAGNOSIS — Z8719 Personal history of other diseases of the digestive system: Secondary | ICD-10-CM | POA: Insufficient documentation

## 2022-01-27 DIAGNOSIS — Y793 Surgical instruments, materials and orthopedic devices (including sutures) associated with adverse incidents: Secondary | ICD-10-CM | POA: Insufficient documentation

## 2022-01-27 DIAGNOSIS — K219 Gastro-esophageal reflux disease without esophagitis: Secondary | ICD-10-CM | POA: Insufficient documentation

## 2022-01-27 DIAGNOSIS — E1151 Type 2 diabetes mellitus with diabetic peripheral angiopathy without gangrene: Secondary | ICD-10-CM | POA: Diagnosis not present

## 2022-01-27 DIAGNOSIS — K8050 Calculus of bile duct without cholangitis or cholecystitis without obstruction: Secondary | ICD-10-CM

## 2022-01-27 DIAGNOSIS — R935 Abnormal findings on diagnostic imaging of other abdominal regions, including retroperitoneum: Secondary | ICD-10-CM

## 2022-01-27 DIAGNOSIS — D696 Thrombocytopenia, unspecified: Secondary | ICD-10-CM | POA: Diagnosis not present

## 2022-01-27 DIAGNOSIS — N1832 Chronic kidney disease, stage 3b: Secondary | ICD-10-CM | POA: Diagnosis not present

## 2022-01-27 DIAGNOSIS — Z9049 Acquired absence of other specified parts of digestive tract: Secondary | ICD-10-CM | POA: Insufficient documentation

## 2022-01-27 DIAGNOSIS — R7989 Other specified abnormal findings of blood chemistry: Secondary | ICD-10-CM

## 2022-01-27 HISTORY — PX: ENDOSCOPIC RETROGRADE CHOLANGIOPANCREATOGRAPHY (ERCP) WITH PROPOFOL: SHX5810

## 2022-01-27 HISTORY — PX: REMOVAL OF STONES: SHX5545

## 2022-01-27 HISTORY — PX: SPHINCTEROTOMY: SHX5544

## 2022-01-27 LAB — CUP PACEART REMOTE DEVICE CHECK
Battery Remaining Longevity: 20 mo
Battery Remaining Percentage: 25 %
Battery Voltage: 2.95 V
Brady Statistic AP VP Percent: 19 %
Brady Statistic AP VS Percent: 1 %
Brady Statistic AS VP Percent: 81 %
Brady Statistic AS VS Percent: 1 %
Brady Statistic RA Percent Paced: 18 %
Brady Statistic RV Percent Paced: 99 %
Date Time Interrogation Session: 20230905020012
Implantable Lead Implant Date: 20160321
Implantable Lead Implant Date: 20160321
Implantable Lead Location: 753859
Implantable Lead Location: 753860
Implantable Pulse Generator Implant Date: 20160321
Lead Channel Impedance Value: 360 Ohm
Lead Channel Impedance Value: 360 Ohm
Lead Channel Pacing Threshold Amplitude: 0.75 V
Lead Channel Pacing Threshold Amplitude: 1.25 V
Lead Channel Pacing Threshold Pulse Width: 0.5 ms
Lead Channel Pacing Threshold Pulse Width: 0.8 ms
Lead Channel Sensing Intrinsic Amplitude: 12 mV
Lead Channel Sensing Intrinsic Amplitude: 2.7 mV
Lead Channel Setting Pacing Amplitude: 2.5 V
Lead Channel Setting Pacing Amplitude: 3 V
Lead Channel Setting Pacing Pulse Width: 0.5 ms
Lead Channel Setting Sensing Sensitivity: 8 mV
Pulse Gen Model: 2240
Pulse Gen Serial Number: 7734710

## 2022-01-27 LAB — GLUCOSE, CAPILLARY: Glucose-Capillary: 77 mg/dL (ref 70–99)

## 2022-01-27 SURGERY — ENDOSCOPIC RETROGRADE CHOLANGIOPANCREATOGRAPHY (ERCP) WITH PROPOFOL
Anesthesia: General

## 2022-01-27 MED ORDER — SODIUM CHLORIDE 0.9 % IV SOLN
INTRAVENOUS | Status: DC | PRN
  Administered 2022-01-27: 22 mL

## 2022-01-27 MED ORDER — PROPOFOL 10 MG/ML IV BOLUS
INTRAVENOUS | Status: AC
  Filled 2022-01-27: qty 20

## 2022-01-27 MED ORDER — SODIUM CHLORIDE 0.9 % IV SOLN
INTRAVENOUS | Status: DC
  Administered 2022-01-27: 1000 mL via INTRAVENOUS

## 2022-01-27 MED ORDER — PHENYLEPHRINE 80 MCG/ML (10ML) SYRINGE FOR IV PUSH (FOR BLOOD PRESSURE SUPPORT)
PREFILLED_SYRINGE | INTRAVENOUS | Status: DC | PRN
  Administered 2022-01-27: 40 ug via INTRAVENOUS
  Administered 2022-01-27 (×4): 80 ug via INTRAVENOUS

## 2022-01-27 MED ORDER — EPHEDRINE SULFATE (PRESSORS) 50 MG/ML IJ SOLN
INTRAMUSCULAR | Status: DC | PRN
  Administered 2022-01-27 (×2): 10 mg via INTRAVENOUS
  Administered 2022-01-27 (×2): 5 mg via INTRAVENOUS

## 2022-01-27 MED ORDER — PROPOFOL 10 MG/ML IV BOLUS
INTRAVENOUS | Status: DC | PRN
  Administered 2022-01-27: 80 mg via INTRAVENOUS

## 2022-01-27 MED ORDER — LIDOCAINE 2% (20 MG/ML) 5 ML SYRINGE
INTRAMUSCULAR | Status: DC | PRN
  Administered 2022-01-27: 100 mg via INTRAVENOUS

## 2022-01-27 MED ORDER — FENTANYL CITRATE (PF) 100 MCG/2ML IJ SOLN
INTRAMUSCULAR | Status: DC | PRN
  Administered 2022-01-27 (×2): 50 ug via INTRAVENOUS

## 2022-01-27 MED ORDER — DEXAMETHASONE SODIUM PHOSPHATE 4 MG/ML IJ SOLN
INTRAMUSCULAR | Status: DC | PRN
  Administered 2022-01-27: 8 mg via INTRAVENOUS

## 2022-01-27 MED ORDER — LACTATED RINGERS IV SOLN: INTRAVENOUS | Status: DC | PRN

## 2022-01-27 MED ORDER — SUCCINYLCHOLINE CHLORIDE 200 MG/10ML IV SOSY
PREFILLED_SYRINGE | INTRAVENOUS | Status: DC | PRN
  Administered 2022-01-27: 100 mg via INTRAVENOUS

## 2022-01-27 MED ORDER — GLUCAGON HCL RDNA (DIAGNOSTIC) 1 MG IJ SOLR
INTRAMUSCULAR | Status: AC
  Filled 2022-01-27: qty 2

## 2022-01-27 MED ORDER — ONDANSETRON HCL 4 MG/2ML IJ SOLN
INTRAMUSCULAR | Status: DC | PRN
  Administered 2022-01-27: 4 mg via INTRAVENOUS

## 2022-01-27 MED ORDER — DICLOFENAC SUPPOSITORY 100 MG
RECTAL | Status: AC
  Filled 2022-01-27: qty 1

## 2022-01-27 MED ORDER — CIPROFLOXACIN IN D5W 400 MG/200ML IV SOLN
INTRAVENOUS | Status: AC
  Filled 2022-01-27: qty 200

## 2022-01-27 MED ORDER — CIPROFLOXACIN IN D5W 400 MG/200ML IV SOLN
INTRAVENOUS | Status: DC | PRN
  Administered 2022-01-27: 400 mg via INTRAVENOUS

## 2022-01-27 MED ORDER — FENTANYL CITRATE (PF) 100 MCG/2ML IJ SOLN
INTRAMUSCULAR | Status: AC
  Filled 2022-01-27: qty 2

## 2022-01-27 MED ORDER — GLUCAGON HCL RDNA (DIAGNOSTIC) 1 MG IJ SOLR
INTRAMUSCULAR | Status: DC | PRN
  Administered 2022-01-27 (×2): .25 mL via INTRAVENOUS

## 2022-01-27 MED ORDER — DICLOFENAC SUPPOSITORY 100 MG
RECTAL | Status: DC | PRN
  Administered 2022-01-27: 100 mg via RECTAL

## 2022-01-27 NOTE — Anesthesia Procedure Notes (Signed)
Procedure Name: Intubation Date/Time: 01/27/2022 1:46 PM  Performed by: Deliah Boston, CRNAPre-anesthesia Checklist: Patient identified, Emergency Drugs available, Suction available and Patient being monitored Patient Re-evaluated:Patient Re-evaluated prior to induction Oxygen Delivery Method: Circle system utilized Preoxygenation: Pre-oxygenation with 100% oxygen Induction Type: IV induction Ventilation: Mask ventilation without difficulty Laryngoscope Size: 4 and Mac Grade View: Grade I Tube type: Oral Tube size: 7.5 mm Number of attempts: 1 Airway Equipment and Method: Stylet and Oral airway Placement Confirmation: ETT inserted through vocal cords under direct vision, positive ETCO2 and breath sounds checked- equal and bilateral Secured at: 21 cm Tube secured with: Tape Dental Injury: Teeth and Oropharynx as per pre-operative assessment

## 2022-01-27 NOTE — Op Note (Addendum)
[facility] Patient Name: Dean Murphy Procedure Date: 01/27/2022 MRN: [id number] Attending MD: Ladene Artist , MD Date of Birth: 11-03-1926 CSN: [id number] Age: 88 Admit Type: Outpatient Procedure:                ERCP Indications:              Bile duct stone(s), Biliary dilation on Computed                            Tomogram Scan, Bile duct stones on Computed                            Tomogram Scan, Elevated liver enzymes Providers:                Pricilla Riffle. Fuller Plan, MD, Burtis Junes, RN, Jaci Carrel, RN, William Dalton, Technician Referring MD:             Elveria Rising. Damita Dunnings, MD Medicines:                General Anesthesia Complications:            No immediate complications. Estimated blood loss:                            None Estimated Blood Loss:     Estimated blood loss was minimal. Procedure:                Pre-Anesthesia Assessment:                           - Prior to the procedure, a History and Physical                            was performed, and patient medications and                            allergies were reviewed. The patient's tolerance of                            previous anesthesia was also reviewed. The risks                            and benefits of the procedure and the sedation                            options and risks were discussed with the patient.                            All questions were answered, and informed consent                            was obtained. Prior Anticoagulants: The patient has  taken Eliquis (apixaban), last dose was 4 days                            prior to procedure. The patiebnt has taken Lovenox,                            last dose 1 day prior to the procedure. ASA Grade                            Assessment: III - A patient with severe systemic                            disease. After reviewing the risks and benefits,                             the patient was deemed in satisfactory condition to                            undergo the procedure.                           After obtaining informed consent, the scope was                            passed under direct vision. Throughout the                            procedure, the patient's blood pressure, pulse, and                            oxygen saturations were monitored continuously. The                            TJF-Q190V ([id number]) Olympus duodenoscope was                            introduced through the mouth, and used to inject                            contrast into and used to inject contrast into the                            bile duct. The ERCP was accomplished without                            difficulty. The patient tolerated the procedure                            well. Scope In: Scope Out: Findings:      A scout film of the abdomen was obtained. Surgical clips, consistent       with a previous cholecystectomy, were seen in the area of the right       upper quadrant of the abdomen. The scope was advanced  to a normal major       papilla in the descending duodenum. Very limited examination of the       pharynx, larynx and associated structures, and upper GI tract appeared       normal. A straight Roadrunner wire was passed into the biliary tree. The       traction (standard) sphincterotome was passed over the guidewire and the       bile duct was then deeply cannulated. Contrast was injected. I       personally interpreted the bile duct images. There was appropriate flow       of contrast through the ducts. The common bile duct was diffusely       dilated, with stones causing an obstruction. The largest diameter was 15       mm. A cholecystectomy had been performed. The intrahepatic ducts       appeared normal. The common bile duct contained multiple stones (~12),       the largest of which was 8 mm in diameter. An 8 mm biliary       sphincterotomy was made  with a traction (standard) sphincterotome using       ERBE electrocautery. There was self limited oozing from the       sphincterotomy which did not require treatment. There was no oozing       noted at the end of the procedure. The biliary tree was swept several       times with a 12-15 mm balloon starting at the bifurcation. All stones       were removed. No stones remained. The final occulsion cholangiogram was       free of filling defects. Air then refluxed into the CBD and very good       biliary drainage was noted. The PD was not cannulated or injected by       intention. Impression:               - The common bile duct was dilated, with multiple                            stones causing an obstruction.                           - Prior cholecystectomy.                           - Choledocholithiasis was found. Complete removal                            was accomplished by biliary sphincterotomy and                            balloon extraction. Moderate Sedation:      Not Applicable - Patient had care per Anesthesia. Recommendation:           - Avoid aspirin and nonsteroidal anti-inflammatory                            medicines for 2 weeks.                           - Clear liquid  diet for 2 hours, then advance as                            tolerated to soft, low fat diet later today.                           - Return to GI office in 6 weeks.                           - Observe patient's clinical course following                            today's ERCP with therapeutic intervention.                           - OK to resume Lovenox on 01/29/2022 and Eliquis on                            01/31/2022. Managing MD to follow. Procedure Code(s):        --- Professional ---                           [phone], Endoscopic retrograde                            cholangiopancreatography (ERCP); with removal of                            calculi/debris from biliary/pancreatic duct(s)                            43262, Endoscopic retrograde                            cholangiopancreatography (ERCP); with                            sphincterotomy/papillotomy Diagnosis Code(s):        --- Professional ---                           K80.51, Calculus of bile duct without cholangitis                            or cholecystitis with obstruction                           Z90.49, Acquired absence of other specified parts                            of digestive tract                           R74.8, Abnormal levels of other serum enzymes                           K83.8, Other  specified diseases of biliary tract CPT copyright 2019 [employer]. All rights reserved. The codes documented in this report are preliminary and upon coder review may  be revised to meet current compliance requirements. Ladene Artist, MD 01/27/2022 2:36:14 PM This report has been signed electronically. Number of Addenda: 0

## 2022-01-27 NOTE — Discharge Instructions (Signed)
YOU HAD AN ENDOSCOPIC PROCEDURE TODAY: Refer to the procedure report and other information in the discharge instructions given to you for any specific questions about what was found during the examination. If this information does not answer your questions, please call [facility] office at [phone] to clarify.  ° °YOU SHOULD EXPECT: Some feelings of bloating in the abdomen. Passage of more gas than usual. Walking can help get rid of the air that was put into your GI tract during the procedure and reduce the bloating. If you had a lower endoscopy (such as a colonoscopy or flexible sigmoidoscopy) you may notice spotting of blood in your stool or on the toilet paper. Some abdominal soreness may be present for a day or two, also. ° °DIET: Your first meal following the procedure should be a light meal and then it is ok to progress to your normal diet. A half-sandwich or bowl of soup is an example of a good first meal. Heavy or fried foods are harder to digest and may make you feel nauseous or bloated. Drink plenty of fluids but you should avoid alcoholic beverages for 24 hours. If you had a esophageal dilation, please see attached instructions for diet.   ° °ACTIVITY: Your care partner should take you home directly after the procedure. You should plan to take it easy, moving slowly for the rest of the day. You can resume normal activity the day after the procedure however YOU SHOULD NOT DRIVE, use power tools, machinery or perform tasks that involve climbing or major physical exertion for 24 hours (because of the sedation medicines used during the test).  ° °SYMPTOMS TO REPORT IMMEDIATELY: °A gastroenterologist can be reached at any hour. Please call [phone]  for any of the following symptoms:  °Following lower endoscopy (colonoscopy, flexible sigmoidoscopy) °Excessive amounts of blood in the stool  °Significant tenderness, worsening of abdominal pains  °Swelling of the abdomen that is new, acute  °Fever of 100° or  higher  °Following upper endoscopy (EGD, EUS, ERCP, esophageal dilation) °Vomiting of blood or coffee ground material  °New, significant abdominal pain  °New, significant chest pain or pain under the shoulder blades  °Painful or persistently difficult swallowing  °New shortness of breath  °Black, tarry-looking or red, bloody stools ° °FOLLOW UP:  °If any biopsies were taken you will be contacted by phone or by letter within the next 1-3 weeks. Call [phone]  if you have not heard about the biopsies in 3 weeks.  °Please also call with any specific questions about appointments or follow up tests. ° °

## 2022-01-27 NOTE — Transfer of Care (Signed)
Immediate Anesthesia Transfer of Care Note  Patient: Dean Murphy  Procedure(s) Performed: Procedure(s): ENDOSCOPIC RETROGRADE CHOLANGIOPANCREATOGRAPHY (ERCP) WITH PROPOFOL (N/A) SPHINCTEROTOMY REMOVAL OF STONES  Patient Location: PACU  Anesthesia Type:General  Level of Consciousness: Patient easily awoken, sedated, comfortable, cooperative, following commands, responds to stimulation.   Airway & Oxygen Therapy: Patient spontaneously breathing, ventilating well, oxygen via simple oxygen mask.  Post-op Assessment: Report given to PACU RN, vital signs reviewed and stable, moving all extremities.   Post vital signs: Reviewed and stable.  Complications: No apparent anesthesia complications Last Vitals:  Vitals Value Taken Time  BP 145/33 01/27/22 1440  Temp    Pulse 60 01/27/22 1442  Resp 13 01/27/22 1442  SpO2 100 % 01/27/22 1442  Vitals shown include unvalidated device data.  Last Pain:  Vitals:   01/27/22 1230  TempSrc: Temporal  PainSc: 0-No pain         Complications: No notable events documented.

## 2022-01-27 NOTE — Interval H&P Note (Signed)
History and Physical Interval Note:  01/27/2022 1:17 PM  Dean Murphy  has presented today for surgery, with the diagnosis of CBD stone.  The various methods of treatment have been discussed with the patient and family. After consideration of risks, benefits and other options for treatment, the patient has consented to  Procedure(s): ENDOSCOPIC RETROGRADE CHOLANGIOPANCREATOGRAPHY (ERCP) WITH PROPOFOL (N/A) as a surgical intervention.  The patient's history has been reviewed, patient examined, no change in status, stable for surgery.  I have reviewed the patient's chart and labs.  Questions were answered to the patient's satisfaction.     Pricilla Riffle. Fuller Plan

## 2022-01-27 NOTE — Anesthesia Preprocedure Evaluation (Signed)
Anesthesia Evaluation  Patient identified by MRN, date of birth, ID band Patient awake    Reviewed: Allergy & Precautions, NPO status , Patient's Chart, lab work & pertinent test results  Airway Mallampati: II  TM Distance: >3 FB Neck ROM: Full    Dental no notable dental hx.    Pulmonary neg pulmonary ROS, former smoker,    Pulmonary exam normal breath sounds clear to auscultation       Cardiovascular hypertension, + CAD, + Past MI, + CABG and + Peripheral Vascular Disease  Normal cardiovascular exam+ pacemaker  Rhythm:Regular Rate:Normal     Neuro/Psych CVA negative psych ROS   GI/Hepatic Neg liver ROS, GERD  ,  Endo/Other  diabetes  Renal/GU Renal InsufficiencyRenal disease  negative genitourinary   Musculoskeletal negative musculoskeletal ROS (+)   Abdominal   Peds negative pediatric ROS (+)  Hematology negative hematology ROS (+)   Anesthesia Other Findings   Reproductive/Obstetrics negative OB ROS                             Anesthesia Physical Anesthesia Plan  ASA: 3  Anesthesia Plan: General   Post-op Pain Management: Minimal or no pain anticipated   Induction: Intravenous  PONV Risk Score and Plan: 2 and Ondansetron and Treatment may vary due to age or medical condition  Airway Management Planned: Oral ETT  Additional Equipment:   Intra-op Plan:   Post-operative Plan: Extubation in OR  Informed Consent: I have reviewed the patients History and Physical, chart, labs and discussed the procedure including the risks, benefits and alternatives for the proposed anesthesia with the patient or authorized representative who has indicated his/her understanding and acceptance.     Dental advisory given  Plan Discussed with: CRNA and Surgeon  Anesthesia Plan Comments:         Anesthesia Quick Evaluation

## 2022-01-28 ENCOUNTER — Encounter: Payer: Self-pay | Admitting: Family Medicine

## 2022-01-28 ENCOUNTER — Other Ambulatory Visit: Payer: Self-pay | Admitting: Family Medicine

## 2022-01-28 DIAGNOSIS — R7989 Other specified abnormal findings of blood chemistry: Secondary | ICD-10-CM

## 2022-01-28 NOTE — Telephone Encounter (Signed)
Refill request Lantus solostar Request shows 30 units daily Medication list shows 34 units  Last office visit 01/23/22 Please confirm units

## 2022-01-28 NOTE — Telephone Encounter (Signed)
Would continue as is, ie 34. Sent.

## 2022-01-28 NOTE — Anesthesia Postprocedure Evaluation (Signed)
Anesthesia Post Note  Patient: TAMON PARKERSON  Procedure(s) Performed: ENDOSCOPIC RETROGRADE CHOLANGIOPANCREATOGRAPHY (ERCP) WITH PROPOFOL SPHINCTEROTOMY REMOVAL OF STONES     Patient location during evaluation: PACU Anesthesia Type: General Level of consciousness: awake and alert Pain management: pain level controlled Vital Signs Assessment: post-procedure vital signs reviewed and stable Respiratory status: spontaneous breathing, nonlabored ventilation, respiratory function stable and patient connected to nasal cannula oxygen Cardiovascular status: blood pressure returned to baseline and stable Postop Assessment: no apparent nausea or vomiting Anesthetic complications: no   No notable events documented.  Last Vitals:  Vitals:   01/27/22 1510 01/27/22 1512  BP: 108/83 (!) 150/55  Pulse: 63 61  Resp: (!) 25 12  Temp:    SpO2: 100% 99%    Last Pain:  Vitals:   01/27/22 1512  TempSrc:   PainSc: 0-No pain                 Kayon Dozier S

## 2022-01-29 ENCOUNTER — Encounter (HOSPITAL_COMMUNITY): Payer: Self-pay | Admitting: Gastroenterology

## 2022-02-04 ENCOUNTER — Ambulatory Visit: Payer: Medicare Other | Admitting: Gastroenterology

## 2022-02-06 ENCOUNTER — Ambulatory Visit: Payer: Medicare Other | Admitting: Family Medicine

## 2022-02-06 ENCOUNTER — Encounter: Payer: Self-pay | Admitting: Family Medicine

## 2022-02-06 VITALS — BP 138/70 | HR 64 | Temp 97.4°F | Ht 71.0 in | Wt 185.0 lb

## 2022-02-06 DIAGNOSIS — R7989 Other specified abnormal findings of blood chemistry: Secondary | ICD-10-CM | POA: Diagnosis not present

## 2022-02-06 DIAGNOSIS — K4090 Unilateral inguinal hernia, without obstruction or gangrene, not specified as recurrent: Secondary | ICD-10-CM

## 2022-02-06 DIAGNOSIS — Z23 Encounter for immunization: Secondary | ICD-10-CM | POA: Diagnosis not present

## 2022-02-06 LAB — COMPREHENSIVE METABOLIC PANEL
ALT: 41 U/L (ref 0–53)
AST: 53 U/L — ABNORMAL HIGH (ref 0–37)
Albumin: 3.4 g/dL — ABNORMAL LOW (ref 3.5–5.2)
Alkaline Phosphatase: 329 U/L — ABNORMAL HIGH (ref 39–117)
BUN: 27 mg/dL — ABNORMAL HIGH (ref 6–23)
CO2: 29 mEq/L (ref 19–32)
Calcium: 9.2 mg/dL (ref 8.4–10.5)
Chloride: 111 mEq/L (ref 96–112)
Creatinine, Ser: 2.34 mg/dL — ABNORMAL HIGH (ref 0.40–1.50)
GFR: 23.5 mL/min — ABNORMAL LOW (ref >60.00)
Glucose, Bld: 92 mg/dL (ref 70–99)
Potassium: 5.3 mEq/L — ABNORMAL HIGH (ref 3.5–5.1)
Sodium: 145 mEq/L (ref 135–145)
Total Bilirubin: 0.9 mg/dL (ref 0.2–1.2)
Total Protein: 6 g/dL (ref 6.0–8.3)

## 2022-02-06 LAB — CBC WITH DIFFERENTIAL/PLATELET
Basophils Absolute: 0 10*3/uL (ref 0.0–0.1)
Basophils Relative: 0.8 % (ref 0.0–3.0)
Eosinophils Absolute: 0.2 10*3/uL (ref 0.0–0.7)
Eosinophils Relative: 3 % (ref 0.0–5.0)
HCT: 39.9 % (ref 39.0–52.0)
Hemoglobin: 13.2 g/dL (ref 13.0–17.0)
Lymphocytes Relative: 24.6 % (ref 12.0–46.0)
Lymphs Abs: 1.4 10*3/uL (ref 0.7–4.0)
MCHC: 33.2 g/dL (ref 30.0–36.0)
MCV: 92.4 fl (ref 78.0–100.0)
Monocytes Absolute: 0.5 10*3/uL (ref 0.1–1.0)
Monocytes Relative: 8.7 % (ref 3.0–12.0)
Neutro Abs: 3.6 10*3/uL (ref 1.4–7.7)
Neutrophils Relative %: 62.9 % (ref 43.0–77.0)
Platelets: 82 10*3/uL — ABNORMAL LOW (ref 150.0–400.0)
RBC: 4.32 Mil/uL (ref 4.22–5.81)
RDW: 14.3 % (ref 11.5–15.5)
WBC: 5.8 10*3/uL (ref 4.0–10.5)

## 2022-02-06 NOTE — Progress Notes (Unsigned)
No pain in his back unless up and active for a period of time.  Fortunately that has improved.  Due for recheck LFTs.  Prev stones removed.  Previous scope discussed with patient.  Not bothered by hernia.  D/w pt about options.  We can refer him to gen surgery but I think it makes sense to get his labs done first.  He agrees.  He can push the mass back in but it bulges out.  D/w pt about seeing Dr. Dahlia Byes.   D/w pt about yard work and mowing.  He and his family think he can do it safely, cautions d/w pt, especially getting on and off the mower.  Fall button information given to patient/family.  Meds, vitals, and allergies reviewed.   ROS: Per HPI unless specifically indicated in ROS section   GEN: nad, alert and oriented HEENT: ncat NECK: supple w/o LA CV: rrr.   PULM: ctab, no inc wob ABD: soft, +bs EXT: no edema SKIN: Well-perfused.  No jaundice.

## 2022-02-06 NOTE — Patient Instructions (Addendum)
Go to the lab on the way out.   If you have mychart we'll likely use that to update you.    Take care.  Glad to see you. I put in the referral to general surgery but I want to make sure your labs are fine first.    Call [phone] for details about the services offered by [facility].

## 2022-02-08 ENCOUNTER — Other Ambulatory Visit: Payer: Self-pay | Admitting: Family Medicine

## 2022-02-08 DIAGNOSIS — R7989 Other specified abnormal findings of blood chemistry: Secondary | ICD-10-CM

## 2022-02-08 NOTE — Assessment & Plan Note (Signed)
Refer to general surgery.

## 2022-02-08 NOTE — Assessment & Plan Note (Signed)
See notes on labs. 

## 2022-02-16 ENCOUNTER — Ambulatory Visit: Payer: Medicare Other | Admitting: Surgery

## 2022-02-18 NOTE — Progress Notes (Signed)
Remote pacemaker transmission.   

## 2022-02-19 ENCOUNTER — Other Ambulatory Visit (INDEPENDENT_AMBULATORY_CARE_PROVIDER_SITE_OTHER): Payer: Medicare Other

## 2022-02-19 DIAGNOSIS — R7989 Other specified abnormal findings of blood chemistry: Secondary | ICD-10-CM

## 2022-02-19 LAB — COMPREHENSIVE METABOLIC PANEL
ALT: 27 U/L (ref 0–53)
AST: 38 U/L — ABNORMAL HIGH (ref 0–37)
Albumin: 3.5 g/dL (ref 3.5–5.2)
Alkaline Phosphatase: 209 U/L — ABNORMAL HIGH (ref 39–117)
BUN: 29 mg/dL — ABNORMAL HIGH (ref 6–23)
CO2: 27 mEq/L (ref 19–32)
Calcium: 9.2 mg/dL (ref 8.4–10.5)
Chloride: 109 mEq/L (ref 96–112)
Creatinine, Ser: 2.43 mg/dL — ABNORMAL HIGH (ref 0.40–1.50)
GFR: 22.45 mL/min — ABNORMAL LOW (ref >60.00)
Glucose, Bld: 158 mg/dL — ABNORMAL HIGH (ref 70–99)
Potassium: 5.4 mEq/L — ABNORMAL HIGH (ref 3.5–5.1)
Sodium: 143 mEq/L (ref 135–145)
Total Bilirubin: 0.7 mg/dL (ref 0.2–1.2)
Total Protein: 6.3 g/dL (ref 6.0–8.3)

## 2022-02-19 LAB — CBC WITH DIFFERENTIAL/PLATELET
Basophils Absolute: 0 10*3/uL (ref 0.0–0.1)
Basophils Relative: 0.4 % (ref 0.0–3.0)
Eosinophils Absolute: 0.1 10*3/uL (ref 0.0–0.7)
Eosinophils Relative: 2.3 % (ref 0.0–5.0)
HCT: 40 % (ref 39.0–52.0)
Hemoglobin: 13.2 g/dL (ref 13.0–17.0)
Lymphocytes Relative: 40.6 % (ref 12.0–46.0)
Lymphs Abs: 1.6 10*3/uL (ref 0.7–4.0)
MCHC: 33 g/dL (ref 30.0–36.0)
MCV: 93 fl (ref 78.0–100.0)
Monocytes Absolute: 0.3 10*3/uL (ref 0.1–1.0)
Monocytes Relative: 6.7 % (ref 3.0–12.0)
Neutro Abs: 2 10*3/uL (ref 1.4–7.7)
Neutrophils Relative %: 50 % (ref 43.0–77.0)
Platelets: 89 10*3/uL — ABNORMAL LOW (ref 150.0–400.0)
RBC: 4.31 Mil/uL (ref 4.22–5.81)
RDW: 14.1 % (ref 11.5–15.5)
WBC: 4 10*3/uL (ref 4.0–10.5)

## 2022-02-24 ENCOUNTER — Other Ambulatory Visit: Payer: Self-pay | Admitting: Family Medicine

## 2022-02-24 DIAGNOSIS — R7989 Other specified abnormal findings of blood chemistry: Secondary | ICD-10-CM

## 2022-03-04 ENCOUNTER — Encounter: Payer: Self-pay | Admitting: Surgery

## 2022-03-04 ENCOUNTER — Ambulatory Visit: Payer: Medicare Other | Admitting: Surgery

## 2022-03-04 VITALS — BP 168/78 | HR 70 | Temp 98.0°F | Ht 71.0 in | Wt 186.0 lb

## 2022-03-04 DIAGNOSIS — K4090 Unilateral inguinal hernia, without obstruction or gangrene, not specified as recurrent: Secondary | ICD-10-CM

## 2022-03-04 NOTE — Patient Instructions (Signed)
If your hernia starts to bother you more then we would like to see you back in the office to speak about having this repaired.  You would need to be off of your Eliquis prior to surgery.   Inguinal Hernia, Adult An inguinal hernia develops when fat or the intestines push through a weak spot in a muscle where the leg meets the lower abdomen (groin). This creates a bulge. This kind of hernia could also be: In the scrotum, if you are male. In folds of skin around the vagina, if you are male. There are three types of inguinal hernias: Hernias that can be pushed back into the abdomen (are reducible). This type rarely causes pain. Hernias that are not reducible (are incarcerated). Hernias that are not reducible and lose their blood supply (are strangulated). This type of hernia requires emergency surgery. What are the causes? This condition is caused by having a weak spot in the muscles or tissues in your groin. This develops over time. The hernia may poke through the weak spot when you suddenly strain your lower abdominal muscles, such as when you: Lift a heavy object. Strain to have a bowel movement. Constipation can lead to straining. Cough. What increases the risk? This condition is more likely to develop in: Males. Pregnant females. People who: Are overweight. Work in jobs that require long periods of standing or heavy lifting. Have had an inguinal hernia before. Smoke or have lung disease. These factors can lead to long-term (chronic) coughing. What are the signs or symptoms? Symptoms may depend on the size of the hernia. Often, a small inguinal hernia has no symptoms. Symptoms of a larger hernia may include: A bulge in the groin area. This is easier to see when standing. It might not be visible when lying down. Pain or burning in the groin. This may get worse when lifting, straining, or coughing. A dull ache or a feeling of pressure in the groin. An unusual bulge in the scrotum, in  males. Symptoms of a strangulated inguinal hernia may include: A bulge in your groin that is very painful and tender to the touch. A bulge that turns red or purple. Fever, nausea, and vomiting. Inability to have a bowel movement or to pass gas. How is this diagnosed? This condition is diagnosed based on your symptoms, your medical history, and a physical exam. Your health care provider may feel your groin area and ask you to cough. How is this treated? Treatment depends on the size of your hernia and whether you have symptoms. If you do not have symptoms, your health care provider may have you watch your hernia carefully and have you come in for follow-up visits. If your hernia is large or if you have symptoms, you may need surgery to repair the hernia. Follow these instructions at home: Lifestyle Avoid lifting heavy objects. Avoid standing for long periods of time. Do not use any products that contain nicotine or tobacco. These products include cigarettes, chewing tobacco, and vaping devices, such as e-cigarettes. If you need help quitting, ask your health care provider. Maintain a healthy weight. Preventing constipation You may need to take these actions to prevent or treat constipation: Drink enough fluid to keep your urine pale yellow. Take over-the-counter or prescription medicines. Eat foods that are high in fiber, such as beans, whole grains, and fresh fruits and vegetables. Limit foods that are high in fat and processed sugars, such as fried or sweet foods. General instructions You may try to push  the hernia back in place by very gently pressing on it while lying down. Do not try to force the bulge back in if it will not push in easily. Watch your hernia for any changes in shape, size, or color. Get help right away if you notice any changes. Take over-the-counter and prescription medicines only as told by your health care provider. Keep all follow-up visits. This is  important. Contact a health care provider if: You have a fever or chills. You develop new symptoms. Your symptoms get worse. Get help right away if: You have pain in your groin that suddenly gets worse. You have a bulge in your groin that: Suddenly gets bigger and does not get smaller. Becomes red or purple or painful to the touch. You are a man and you have a sudden pain in your scrotum, or the size of your scrotum suddenly changes. You cannot push the hernia back in place by very gently pressing on it when you are lying down. You have nausea or vomiting that does not go away. You have a fast heartbeat. You cannot have a bowel movement or pass gas. These symptoms may represent a serious problem that is an emergency. Do not wait to see if the symptoms will go away. Get medical help right away. Call your local emergency services (911 in the [place] Summary An inguinal hernia develops when fat or the intestines push through a weak spot in a muscle where your leg meets your lower abdomen (groin). This condition is caused by having a weak spot in muscles or tissues in your groin. Symptoms may depend on the size of the hernia, and they may include pain or swelling in your groin. A small inguinal hernia often has no symptoms. Treatment may not be needed if you do not have symptoms. If you have symptoms or a large hernia, you may need surgery to repair the hernia. Avoid lifting heavy objects. Also, avoid standing for long periods of time. This information is not intended to replace advice given to you by your health care provider. Make sure you discuss any questions you have with your health care provider. Document Revised: 01/09/2020 Document Reviewed: 01/09/2020 Elsevier Patient Education  [employer].

## 2022-03-08 ENCOUNTER — Encounter: Payer: Self-pay | Admitting: Surgery

## 2022-03-08 NOTE — Progress Notes (Signed)
Patient ID: Dean Murphy, male   DOB: 1926/12/26, 88 y.o.   MRN: [id number]  HPI Dean Murphy is a 88 y.o. male seen in consultation at the request of Dr. Damita Dunnings for a right inguinal hernia.  He reports that he has this hernia for few years now.  He expresses that he is able to reduce the hernia.  Some occasional sharp pains are intermittent and last for few seconds.  No fevers no chills no prior inguinal hernia operations. Of note he did have a history of remote cholecystectomy and more recently history of choledocholithiasis status post ERCP.  He does have a significant heart history to include coronary artery disease with prior MI.  Status post CABG.  He does have a pacemaker and is currently anticoagulated ( Eliuis) with prior strokes.  He does have some degree of thrombocytopenia with platelets of 89,000 otherwise his CBC is unremarkable.  CMP shows chronic renal insufficiency with a baseline creatinine of 2.34.  He did have an ultrasound that I personally reviewed showing evidence of choledocholithiasis.  HPI  Past Medical History:  Diagnosis Date   Anemia    Arthritis    CAD (coronary artery disease)    a. CABG 1989, b. Myoview low risk 2012.   Carotid artery occlusion    a. Duplex 10/2014: patent R/L CEA with mild hyperplasia in right surgical bulb - followed by vascular.   CHB (complete heart block) [facility]) March 2016   a. s/p STJ dual chamber pacemaker 07/2014.   Chronic diastolic CHF (congestive heart failure) ([place])    a. Dx 08/2351 - acute diastolic CHF in the setting of CHB.   Chronic kidney disease, stage IV (severe) (HCC)    stage III/IV as of 2015, [name]   Colon polyps    Complication of anesthesia    Constipation    CVA (cerebral infarction)    Diabetes mellitus    type II   Diverticulosis    Dupuytren's disease    finger right hand contracted   Essential hypertension    GERD (gastroesophageal reflux disease)    Hemorrhoids    Hyperlipidemia    Myocardial  infarction ([place]) 1976  and Mar. 19, 2016   Orthostasis    Osteoporosis    PAF (paroxysmal atrial fibrillation) ([place]) March 2016   PONV (postoperative nausea and vomiting)    Presence of permanent cardiac pacemaker    Shortness of breath dyspnea    due to pain    Past Surgical History:  Procedure Laterality Date   CARDIAC CATHETERIZATION  08/11/2014   Procedure: TEMPORARY PACEMAKER;  Surgeon: [name], MD;  Location: [facility] CATH LAB;  Service: Cardiovascular;;   CAROTID ENDARTERECTOMY  12/31/10   Right   CAROTID ENDARTERECTOMY  02/09/11   left   CHOLECYSTECTOMY     CORONARY ARTERY BYPASS GRAFT  1989   ENDOSCOPIC RETROGRADE CHOLANGIOPANCREATOGRAPHY (ERCP) WITH PROPOFOL N/A 01/27/2022   Procedure: ENDOSCOPIC RETROGRADE CHOLANGIOPANCREATOGRAPHY (ERCP) WITH PROPOFOL;  Surgeon: Ladene Artist, MD;  Location: [facility] ENDOSCOPY;  Service: Gastroenterology;  Laterality: N/A;   EYE SURGERY Bilateral    Cataract with implants   I & D KNEE WITH POLY EXCHANGE Left 07/29/2015   Procedure: IRRIGATION AND DEBRIDEMENT LEFT KNEE WITH POLY EXCHANGE;  Surgeon: Rod Can, MD;  Location: [facility];  Service: Orthopedics;  Laterality: Left;   IR GENERIC HISTORICAL  03/04/2016   IR US GUIDE VASC ACCESS RIGHT 03/04/2016 Marybelle Killings, MD [facility]   IR GENERIC HISTORICAL  03/04/2016   IR FLUORO GUIDE CV LINE RIGHT 03/04/2016 Marybelle Killings, MD [facility]   JOINT REPLACEMENT     bilat. knees   PERMANENT PACEMAKER INSERTION N/A 08/13/2014   STJ dual chamber pacemaker implanted by [name] for CHB   REMOVAL OF STONES  01/27/2022   Procedure: REMOVAL OF STONES;  Surgeon: Ladene Artist, MD;  Location: [facility];  Service: Gastroenterology;;   Joan Mayans  01/27/2022   Procedure: Joan Mayans;  Surgeon: Ladene Artist, MD;  Location: [facility];  Service: Gastroenterology;;   TONSILLECTOMY     TOTAL KNEE ARTHROPLASTY     bilateral    Family History  Problem Relation Age of Onset   Heart disease  Mother        Before age 56   Diabetes Mother    Varicose Veins Mother    Heart attack Mother    Heart attack Father    Heart disease Father        After age 97   Hypertension Father    Heart disease Brother        Before age 57   Hypertension Brother    Heart attack Brother    Diabetes Son    Hypertension Son    Hypertension Son    Diabetes Son    Diabetes Other    Cancer Other    Colon cancer Neg Hx     Social History Social History   Tobacco Use   Smoking status: Former    Packs/day: 1.00    Years: 25.00    Total pack years: 25.00    Types: Cigarettes    Quit date: 01/21/1973    Years since quitting: 49.1    Passive exposure: Past   Smokeless tobacco: Never   Tobacco comments:    began smoking in high school, quit age 40  Vaping Use   Vaping Use: Never used  Substance Use Topics   Alcohol use: Yes    Alcohol/week: 0.0 standard drinks of alcohol    Comment: wine occ   Drug use: No    Allergies  Allergen Reactions   Zoledronic Acid Other (See Comments)    Stopped 2015 due to Creatine  Other reaction(s): Unknown Stopped 2015 due to Cr   Lokelma [Sodium Zirconium Cyclosilicate]     Nausea/vomiting   Nsaids Other (See Comments)    Held due to creatinine elevation.      Current Outpatient Medications  Medication Sig Dispense Refill   amoxicillin (AMOXIL) 500 MG capsule Take 1 capsule (500 mg total) by mouth 2 (two) times daily. 180 capsule 3   B-D UF III MINI PEN NEEDLES 31G X 5 MM MISC USE DAILY WITH INSULIN PEN 100 each 4   cholecalciferol (VITAMIN D3) 25 MCG (1000 UNIT) tablet Take 1,000 Units by mouth daily.     clotrimazole-betamethasone (LOTRISONE) cream APPLY TO AFFECTED AREA TWICE A DAY 30 g 0   dorzolamide-timolol (COSOPT) 22.3-6.8 MG/ML ophthalmic solution 1 drop daily in the afternoon. In both eye     ELIQUIS 2.5 MG TABS tablet TAKE 1 TABLET BY MOUTH TWICE A DAY 180 tablet 1   ferrous sulfate 325 (65 FE) MG tablet TAKE 1 TABLET BY MOUTH ON  FRIDAY     glipiZIDE (GLUCOTROL) 5 MG tablet TAKE 2 TABLETS BY MOUTH EVERY MORNING BEFORE BREAKFAST AND 1 TABLET BEFORE SUPPER 270 tablet 3   HYDROcodone-acetaminophen (NORCO/VICODIN) 5-325 MG tablet Take 0.5-1 tablets by mouth 3 (three) times daily as needed (for back  pain). 15 tablet 0   insulin glargine (LANTUS SOLOSTAR) 100 UNIT/ML Solostar Pen Inject 34 Units into the skin daily. 15 mL 3   JANUVIA 50 MG tablet TAKE 1 TABLET BY MOUTH EVERY DAY 90 tablet 2   losartan (COZAAR) 100 MG tablet Take 1 tablet (100 mg total) by mouth daily.     lubiprostone (AMITIZA) 8 MCG capsule TAKE ONE CAPSULE BY MOUTH EACH MORNING 90 capsule 1   metoprolol succinate (TOPROL XL) 25 MG 24 hr tablet Take 1 tablet (25 mg total) by mouth at bedtime. 90 tablet 3   omeprazole (PRILOSEC) 40 MG capsule TAKE 1 CAPSULE BY MOUTH EVERY DAY 90 capsule 1   polyethylene glycol powder (GLYCOLAX/MIRALAX) 17 GM/SCOOP powder Take 17 g by mouth daily as needed for mild constipation or moderate constipation.     rosuvastatin (CRESTOR) 20 MG tablet Take 1 tablet (20 mg total) by mouth daily. 90 tablet 3   torsemide (DEMADEX) 10 MG tablet Take 1 tablet (10 mg total) by mouth 2 (two) times a week. Monday and Friday     No current facility-administered medications for this visit.     Review of Systems Full ROS  was asked and was negative except for the information on the HPI  Physical Exam Blood pressure (!) 168/78, pulse 70, temperature 98 F (36.7 C), height '5\' 11"'$  (1.803 m), weight 186 lb (84.4 kg), SpO2 98 %. CONSTITUTIONAL: NAD. EYES: Pupils are equal, round, and , Sclera are non-icteric. EARS, NOSE, MOUTH AND THROAT: The oropharynx is clear. The oral mucosa is pink and moist. Hearing is intact to voice. LYMPH NODES:  Lymph nodes in the neck are normal. RESPIRATORY:  Lungs are clear. There is normal respiratory effort, with equal breath sounds bilaterally, and without pathologic use of accessory muscles. CARDIOVASCULAR:  Heart is regular without murmurs, gallops, or rubs. GI: The abdomen is  soft, nontender, and nondistended. There are no palpable masses. There is no hepatosplenomegaly. There are normal bowel sounds. Evidence of reducible right inguinal hernia. GU: Rectal deferred.   MUSCULOSKELETAL: Normal muscle strength and tone. No cyanosis or edema.   SKIN: Turgor is good and there are no pathologic skin lesions or ulcers. NEUROLOGIC: Motor and sensation is grossly normal. Cranial nerves are grossly intact. PSYCH:  Oriented to person, place and time. Affect is normal.  Data Reviewed  I have personally reviewed the patient's imaging, laboratory findings and medical records.    Assessment/Plan Mr. Caiazzo is a 88 year old male with right inguinal hernia that he is currently minimally symptomatic.  I had an extensive discussion with the patient and his son.  Given his advanced age and comorbidity I want to be a little bit more conservative about offering him surgical intervention.  Chronic this time his symptoms seems to be minimal and the patient is able to reduce the hernia.  I did have a frank discussion about potential risk of not repairing the hernia to include incarceration.  I do think that the best intervention for right now is to do watchful waiting.  I gave him specific instructions that if he were to have crescendo symptoms I do think that I will be a sign that we will need to proceed to repair the hernia.  We went ahead also and discussed different options of repair to include robotic approach versus open.  The risks, the benefits and the possible complications including bleeding, infection, chronic pain, cardiovascular complications as well as wound complications.  They seem comfortable with this approach  and they will call us back in a few months to have a follow-up.  Please note that I spent 60 minutes in this encounter including personally reviewing imaging studies, reviewing extensive medical records,  counseling the patient, placing orders and performing appropriate documentation  Caroleen Hamman, MD FACS General Surgeon 03/08/2022, 8:01 AM

## 2022-03-12 ENCOUNTER — Other Ambulatory Visit (INDEPENDENT_AMBULATORY_CARE_PROVIDER_SITE_OTHER): Payer: Medicare Other

## 2022-03-12 DIAGNOSIS — R7989 Other specified abnormal findings of blood chemistry: Secondary | ICD-10-CM | POA: Diagnosis not present

## 2022-03-12 LAB — COMPREHENSIVE METABOLIC PANEL
ALT: 32 U/L (ref 0–53)
AST: 38 U/L — ABNORMAL HIGH (ref 0–37)
Albumin: 3.6 g/dL (ref 3.5–5.2)
Alkaline Phosphatase: 153 U/L — ABNORMAL HIGH (ref 39–117)
BUN: 30 mg/dL — ABNORMAL HIGH (ref 6–23)
CO2: 27 mEq/L (ref 19–32)
Calcium: 9.1 mg/dL (ref 8.4–10.5)
Chloride: 109 mEq/L (ref 96–112)
Creatinine, Ser: 2.34 mg/dL — ABNORMAL HIGH (ref 0.40–1.50)
GFR: 23.48 mL/min — ABNORMAL LOW (ref >60.00)
Glucose, Bld: 234 mg/dL — ABNORMAL HIGH (ref 70–99)
Potassium: 5.6 mEq/L — ABNORMAL HIGH (ref 3.5–5.1)
Sodium: 141 mEq/L (ref 135–145)
Total Bilirubin: 0.7 mg/dL (ref 0.2–1.2)
Total Protein: 6.1 g/dL (ref 6.0–8.3)

## 2022-03-12 LAB — CBC WITH DIFFERENTIAL/PLATELET
Basophils Absolute: 0 10*3/uL (ref 0.0–0.1)
Basophils Relative: 0.7 % (ref 0.0–3.0)
Eosinophils Absolute: 0.2 10*3/uL (ref 0.0–0.7)
Eosinophils Relative: 3.5 % (ref 0.0–5.0)
HCT: 39.7 % (ref 39.0–52.0)
Hemoglobin: 13 g/dL (ref 13.0–17.0)
Lymphocytes Relative: 26.5 % (ref 12.0–46.0)
Lymphs Abs: 1.4 10*3/uL (ref 0.7–4.0)
MCHC: 32.8 g/dL (ref 30.0–36.0)
MCV: 92.1 fl (ref 78.0–100.0)
Monocytes Absolute: 0.4 10*3/uL (ref 0.1–1.0)
Monocytes Relative: 7.4 % (ref 3.0–12.0)
Neutro Abs: 3.3 10*3/uL (ref 1.4–7.7)
Neutrophils Relative %: 61.9 % (ref 43.0–77.0)
Platelets: 78 10*3/uL — ABNORMAL LOW (ref 150.0–400.0)
RBC: 4.31 Mil/uL (ref 4.22–5.81)
RDW: 14.1 % (ref 11.5–15.5)
WBC: 5.4 10*3/uL (ref 4.0–10.5)

## 2022-03-15 ENCOUNTER — Other Ambulatory Visit: Payer: Self-pay | Admitting: Family Medicine

## 2022-03-15 ENCOUNTER — Other Ambulatory Visit: Payer: Self-pay | Admitting: Internal Medicine

## 2022-03-15 DIAGNOSIS — I480 Paroxysmal atrial fibrillation: Secondary | ICD-10-CM

## 2022-03-16 ENCOUNTER — Other Ambulatory Visit: Payer: Self-pay

## 2022-03-16 MED ORDER — METOPROLOL SUCCINATE ER 25 MG PO TB24: 25.0000 mg | ORAL_TABLET | Freq: Every day | ORAL | 0 refills | Status: DC

## 2022-03-16 NOTE — Telephone Encounter (Signed)
Prescription refill request for Eliquis received. Indication:Afib Last office visit:3/23 Scr:2.3 Age: 88 Weight:84.4 kg  Prescription refilled

## 2022-03-19 ENCOUNTER — Other Ambulatory Visit: Payer: Self-pay | Admitting: Family Medicine

## 2022-03-19 DIAGNOSIS — E875 Hyperkalemia: Secondary | ICD-10-CM

## 2022-03-19 MED ORDER — SODIUM POLYSTYRENE SULFONATE 15 GM/60ML PO SUSP: 15.0000 g | Freq: Every day | ORAL | 0 refills | Status: DC

## 2022-03-22 ENCOUNTER — Other Ambulatory Visit: Payer: Self-pay | Admitting: Family Medicine

## 2022-03-22 DIAGNOSIS — D696 Thrombocytopenia, unspecified: Secondary | ICD-10-CM

## 2022-03-23 ENCOUNTER — Ambulatory Visit: Payer: Medicare Other | Admitting: Physician Assistant

## 2022-03-23 ENCOUNTER — Other Ambulatory Visit (INDEPENDENT_AMBULATORY_CARE_PROVIDER_SITE_OTHER): Payer: Medicare Other

## 2022-03-23 DIAGNOSIS — D696 Thrombocytopenia, unspecified: Secondary | ICD-10-CM | POA: Diagnosis not present

## 2022-03-23 DIAGNOSIS — E875 Hyperkalemia: Secondary | ICD-10-CM | POA: Diagnosis not present

## 2022-03-23 LAB — CBC WITH DIFFERENTIAL/PLATELET
Basophils Absolute: 0 10*3/uL (ref 0.0–0.1)
Basophils Relative: 0.9 % (ref 0.0–3.0)
Eosinophils Absolute: 0.2 10*3/uL (ref 0.0–0.7)
Eosinophils Relative: 4 % (ref 0.0–5.0)
HCT: 37.1 % — ABNORMAL LOW (ref 39.0–52.0)
Hemoglobin: 12.4 g/dL — ABNORMAL LOW (ref 13.0–17.0)
Lymphocytes Relative: 24.6 % (ref 12.0–46.0)
Lymphs Abs: 1.2 10*3/uL (ref 0.7–4.0)
MCHC: 33.3 g/dL (ref 30.0–36.0)
MCV: 91.6 fl (ref 78.0–100.0)
Monocytes Absolute: 0.4 10*3/uL (ref 0.1–1.0)
Monocytes Relative: 7.5 % (ref 3.0–12.0)
Neutro Abs: 3.2 10*3/uL (ref 1.4–7.7)
Neutrophils Relative %: 63 % (ref 43.0–77.0)
Platelets: 92 10*3/uL — ABNORMAL LOW (ref 150.0–400.0)
RBC: 4.06 Mil/uL — ABNORMAL LOW (ref 4.22–5.81)
RDW: 13.9 % (ref 11.5–15.5)
WBC: 5.1 10*3/uL (ref 4.0–10.5)

## 2022-03-23 LAB — BASIC METABOLIC PANEL
BUN: 39 mg/dL — ABNORMAL HIGH (ref 6–23)
CO2: 26 mEq/L (ref 19–32)
Calcium: 8.8 mg/dL (ref 8.4–10.5)
Chloride: 110 mEq/L (ref 96–112)
Creatinine, Ser: 2.58 mg/dL — ABNORMAL HIGH (ref 0.40–1.50)
GFR: 20.88 mL/min — ABNORMAL LOW (ref >60.00)
Glucose, Bld: 203 mg/dL — ABNORMAL HIGH (ref 70–99)
Potassium: 4.1 mEq/L (ref 3.5–5.1)
Sodium: 145 mEq/L (ref 135–145)

## 2022-03-31 ENCOUNTER — Ambulatory Visit: Payer: Medicare Other | Admitting: Gastroenterology

## 2022-04-20 ENCOUNTER — Ambulatory Visit: Payer: Medicare Other | Admitting: Family Medicine

## 2022-04-20 ENCOUNTER — Encounter: Payer: Self-pay | Admitting: Family Medicine

## 2022-04-20 VITALS — BP 122/62 | HR 69 | Temp 97.1°F | Ht 71.0 in | Wt 193.0 lb

## 2022-04-20 DIAGNOSIS — E119 Type 2 diabetes mellitus without complications: Secondary | ICD-10-CM | POA: Diagnosis not present

## 2022-04-20 DIAGNOSIS — R7989 Other specified abnormal findings of blood chemistry: Secondary | ICD-10-CM

## 2022-04-20 DIAGNOSIS — E1122 Type 2 diabetes mellitus with diabetic chronic kidney disease: Secondary | ICD-10-CM

## 2022-04-20 LAB — COMPREHENSIVE METABOLIC PANEL
ALT: 25 U/L (ref 0–53)
AST: 35 U/L (ref 0–37)
Albumin: 3.8 g/dL (ref 3.5–5.2)
Alkaline Phosphatase: 120 U/L — ABNORMAL HIGH (ref 39–117)
BUN: 40 mg/dL — ABNORMAL HIGH (ref 6–23)
CO2: 28 mEq/L (ref 19–32)
Calcium: 8.8 mg/dL (ref 8.4–10.5)
Chloride: 110 mEq/L (ref 96–112)
Creatinine, Ser: 2.73 mg/dL — ABNORMAL HIGH (ref 0.40–1.50)
GFR: 19.5 mL/min — ABNORMAL LOW (ref >60.00)
Glucose, Bld: 91 mg/dL (ref 70–99)
Potassium: 4.7 mEq/L (ref 3.5–5.1)
Sodium: 144 mEq/L (ref 135–145)
Total Bilirubin: 0.5 mg/dL (ref 0.2–1.2)
Total Protein: 6.4 g/dL (ref 6.0–8.3)

## 2022-04-20 LAB — CBC WITH DIFFERENTIAL/PLATELET
Basophils Absolute: 0 10*3/uL (ref 0.0–0.1)
Basophils Relative: 0.8 % (ref 0.0–3.0)
Eosinophils Absolute: 0.2 10*3/uL (ref 0.0–0.7)
Eosinophils Relative: 3.6 % (ref 0.0–5.0)
HCT: 36.8 % — ABNORMAL LOW (ref 39.0–52.0)
Hemoglobin: 12.4 g/dL — ABNORMAL LOW (ref 13.0–17.0)
Lymphocytes Relative: 26.8 % (ref 12.0–46.0)
Lymphs Abs: 1.4 10*3/uL (ref 0.7–4.0)
MCHC: 33.7 g/dL (ref 30.0–36.0)
MCV: 91.9 fl (ref 78.0–100.0)
Monocytes Absolute: 0.4 10*3/uL (ref 0.1–1.0)
Monocytes Relative: 7.2 % (ref 3.0–12.0)
Neutro Abs: 3.2 10*3/uL (ref 1.4–7.7)
Neutrophils Relative %: 61.6 % (ref 43.0–77.0)
Platelets: 86 10*3/uL — ABNORMAL LOW (ref 150.0–400.0)
RBC: 4.01 Mil/uL — ABNORMAL LOW (ref 4.22–5.81)
RDW: 14.6 % (ref 11.5–15.5)
WBC: 5.2 10*3/uL (ref 4.0–10.5)

## 2022-04-20 LAB — HEMOGLOBIN A1C: Hgb A1c MFr Bld: 7.1 % — ABNORMAL HIGH (ref 4.6–6.5)

## 2022-04-20 MED ORDER — POLYETHYLENE GLYCOL 3350 17 GM/SCOOP PO POWD: 17.0000 g | Freq: Every day | ORAL | 1 refills | Status: DC | PRN

## 2022-04-20 NOTE — Progress Notes (Unsigned)
Diabetes:  Using medications without difficulties: yes Hypoglycemic episodes: no Hyperglycemic episodes: no Feet problems: some chronic L ankle swelling at baseline,  Blood Sugars averaging: not checked often eye exam within last year: yes A1c pending.    He is having to strain for BMs.    CKD, K and low platelet level d/w pt.  Recheck labs pending.   He had surgery eval re: hernia w/o plan for surgery at this point.    Meds, vitals, and allergies reviewed.   ROS: Per HPI unless specifically indicated in ROS section   GEN: nad, alert and oriented HEENT: ncat NECK: supple w/o LA CV: rrr. PULM: ctab, no inc wob ABD: soft, +bs EXT: trace L ankle edema SKIN: well perfused.

## 2022-04-20 NOTE — Patient Instructions (Addendum)
Go to the lab on the way out.   If you have mychart we'll likely use that to update you.    Take care.  Glad to see you. Plan on recheck in about 4 months with labs at the visit.   Please call about seeing Dr. Megan Salon- [facility] [phone].  Ask for appointment in Feb or March 2024.  [name]

## 2022-04-22 NOTE — Assessment & Plan Note (Signed)
History of.  Recheck pending.  History of thrombocytopenia.  See notes on labs.  No bleeding.

## 2022-04-22 NOTE — Assessment & Plan Note (Signed)
Continue glipizide and insulin as is for now.  Continue Januvia.  Recheck periodically.  See notes on labs.

## 2022-04-23 ENCOUNTER — Encounter: Payer: Self-pay | Admitting: Nurse Practitioner

## 2022-04-23 ENCOUNTER — Ambulatory Visit: Payer: Medicare Other | Admitting: Nurse Practitioner

## 2022-04-23 VITALS — BP 136/60 | HR 57 | Ht 71.0 in | Wt 195.0 lb

## 2022-04-23 DIAGNOSIS — D649 Anemia, unspecified: Secondary | ICD-10-CM | POA: Diagnosis not present

## 2022-04-23 DIAGNOSIS — K5900 Constipation, unspecified: Secondary | ICD-10-CM

## 2022-04-23 DIAGNOSIS — K8050 Calculus of bile duct without cholangitis or cholecystitis without obstruction: Secondary | ICD-10-CM | POA: Diagnosis not present

## 2022-04-23 NOTE — Progress Notes (Signed)
Chief Complaint:  follow up after ERCP   Assessment    Patient profile:  Dean Murphy is a 88 y.o. male known to [place] with a past medical history of  CKD 3b, DM2, PVD, CVA, CAD / CABG, cardiac pacemaker, Afib on Eliquis, remote IDA ( 2016), GERD, colon polyps, glaucoma, L1 and L2 compression. See PMH /PSH for additional history  # Abnormal liver chemistries / choledocholithiasis, s/p ERCP with sphincterotomy and stone extraction in September 2023. Liver chemistries have normalized except for trivial elevation of alk phos.   # Constipation.   #  Mild [place] anemia, possibly related to CKD. No overt Gi bleeding. He is on oral iron which is likely contributing to constipation   # Afib, on Eliquis   Plan:    Last ferritin was back in 2016 and it was low at 13.6. PCP can update iron studies at some point. If normal then consider seeing how he does off oral iron.  Follow up as needed Continue miralax as needed.  HPI   [name] was last seen in the office 01/12/2022 please refer to that office note for details.  In summary, he was seen for elevated liver chemistries and marked CBD dilation on ultrasound.  Prior lumbar spine CT had demonstrated CBD stones. He underwent ERCP with sphincterotomy and stone extraction on 01/27/22. Liver chemistries as of 04/20/2022 were normal except for minimal elevation in alk phos of 120 ( it was as high as 650 prior to CBD stone extraction) .   Interval History:  [name] is here with his son. He feels well. No abdominal pain. His appetite is good. Weight is stable. He does have problems with constipation.  He has a BM everyday but first stool is generally hard followed by 2-3 soft to loose stools later in day.  Miralax prescribed last week by PCP but he has only taken one dose. No blood in stool  Previous GI Evaluation   Colonoscopy May 2016 Six sessile polyps in the descending, sigmoid, ascending and transverse colon; polypectomies performed with a  cold Snare. Sessile polyp in the ascending colon; polypectomy performed using snare cautery. Moderate diverticulosis noted in the sigmoid colon and descending colon. External hemorrhoids  Surgical [P], ascending x 3, transverse x 1, descending x 1, sigmoid x 2, polyp (7) - TUBULAR ADENOMA(S). - HIGH GRADE DYSPLASIA IS NOT IDENTIFIED.  **No recall due to age   ERCP 01/27/22 for choledocholithiasis -The CBD was dilated with multiple stones causing.  Complete removal of stones accomplished by biliary sphincterotomy and balloon extraction.  Patient advised to follow-up in GI clinic in 6 weeks.    Labs:     Latest Ref Rng & Units 04/20/2022    9:45 AM 03/23/2022    8:25 AM 03/12/2022   10:26 AM  CBC  WBC 4.0 - 10.5 K/uL 5.2  5.1  5.4   Hemoglobin 13.0 - 17.0 g/dL 12.4  12.4  13.0   Hematocrit 39.0 - 52.0 % 36.8  37.1  39.7   Platelets 150.0 - 400.0 K/uL 86.0  92.0  78.0        Latest Ref Rng & Units 04/20/2022    9:45 AM 03/12/2022   10:26 AM 02/19/2022    9:02 AM  Hepatic Function  Total Protein 6.0 - 8.3 g/dL 6.4  6.1  6.3   Albumin 3.5 - 5.2 g/dL 3.8  3.6  3.5   AST 0 - 37 U/L 35  38  38  ALT 0 - 53 U/L 25  32  27   Alk Phosphatase 39 - 117 U/L 120  153  209   Total Bilirubin 0.2 - 1.2 mg/dL 0.5  0.7  0.7      Past Medical History:  Diagnosis Date   Anemia    Arthritis    CAD (coronary artery disease)    a. CABG 1989, b. Myoview low risk 2012.   Carotid artery occlusion    a. Duplex 10/2014: patent R/L CEA with mild hyperplasia in right surgical bulb - followed by vascular.   CHB (complete heart block) [facility]) March 2016   a. s/p STJ dual chamber pacemaker 07/2014.   Chronic diastolic CHF (congestive heart failure) ([place])    a. Dx 06/7060 - acute diastolic CHF in the setting of CHB.   Chronic kidney disease, stage IV (severe) (HCC)    stage III/IV as of 2015, [name]   Colon polyps    Complication of anesthesia    Constipation    CVA (cerebral infarction)     Diabetes mellitus    type II   Diverticulosis    Dupuytren's disease    finger right hand contracted   Essential hypertension    GERD (gastroesophageal reflux disease)    Hemorrhoids    Hyperlipidemia    Myocardial infarction ([place]) 1976  and Mar. 19, 2016   Orthostasis    Osteoporosis    PAF (paroxysmal atrial fibrillation) ([place]) March 2016   PONV (postoperative nausea and vomiting)    Presence of permanent cardiac pacemaker    Shortness of breath dyspnea    due to pain    Past Surgical History:  Procedure Laterality Date   CARDIAC CATHETERIZATION  08/11/2014   Procedure: TEMPORARY PACEMAKER;  Surgeon: [name], MD;  Location: [facility] CATH LAB;  Service: Cardiovascular;;   CAROTID ENDARTERECTOMY  12/31/10   Right   CAROTID ENDARTERECTOMY  02/09/11   left   CHOLECYSTECTOMY     CORONARY ARTERY BYPASS GRAFT  1989   ENDOSCOPIC RETROGRADE CHOLANGIOPANCREATOGRAPHY (ERCP) WITH PROPOFOL N/A 01/27/2022   Procedure: ENDOSCOPIC RETROGRADE CHOLANGIOPANCREATOGRAPHY (ERCP) WITH PROPOFOL;  Surgeon: Ladene Artist, MD;  Location: [facility] ENDOSCOPY;  Service: Gastroenterology;  Laterality: N/A;   EYE SURGERY Bilateral    Cataract with implants   I & D KNEE WITH POLY EXCHANGE Left 07/29/2015   Procedure: IRRIGATION AND DEBRIDEMENT LEFT KNEE WITH POLY EXCHANGE;  Surgeon: Rod Can, MD;  Location: [place];  Service: Orthopedics;  Laterality: Left;   IR GENERIC HISTORICAL  03/04/2016   IR US GUIDE VASC ACCESS RIGHT 03/04/2016 Marybelle Killings, MD [facility]   IR GENERIC HISTORICAL  03/04/2016   IR FLUORO GUIDE CV LINE RIGHT 03/04/2016 Marybelle Killings, MD [facility]   JOINT REPLACEMENT     bilat. knees   PERMANENT PACEMAKER INSERTION N/A 08/13/2014   STJ dual chamber pacemaker implanted by [name] for [place] OF STONES  01/27/2022   Procedure: REMOVAL OF STONES;  Surgeon: Ladene Artist, MD;  Location: [facility];  Service: Gastroenterology;;   Joan Mayans  01/27/2022   Procedure:  Joan Mayans;  Surgeon: Ladene Artist, MD;  Location: [facility];  Service: Gastroenterology;;   TONSILLECTOMY     TOTAL KNEE ARTHROPLASTY     bilateral    Current Medications, Allergies, Family History and Social History were reviewed in [place] record.     Current Outpatient Medications  Medication Sig Dispense Refill   amoxicillin (AMOXIL)  500 MG capsule Take 1 capsule (500 mg total) by mouth 2 (two) times daily. 180 capsule 3   B-D UF III MINI PEN NEEDLES 31G X 5 MM MISC USE DAILY WITH INSULIN PEN 100 each 4   cholecalciferol (VITAMIN D3) 25 MCG (1000 UNIT) tablet Take 1,000 Units by mouth daily.     clotrimazole-betamethasone (LOTRISONE) cream APPLY TO AFFECTED AREA TWICE A DAY 30 g 0   dorzolamide-timolol (COSOPT) 22.3-6.8 MG/ML ophthalmic solution 1 drop daily in the afternoon. In both eye     ELIQUIS 2.5 MG TABS tablet TAKE 1 TABLET BY MOUTH TWICE A DAY 180 tablet 1   ferrous sulfate 325 (65 FE) MG tablet TAKE 1 TABLET BY MOUTH ON FRIDAY     glipiZIDE (GLUCOTROL) 5 MG tablet TAKE 2 TABLETS BY MOUTH EVERY MORNING BEFORE BREAKFAST AND 1 TABLET BEFORE SUPPER 270 tablet 3   insulin glargine (LANTUS SOLOSTAR) 100 UNIT/ML Solostar Pen Inject 34 Units into the skin daily. 15 mL 3   JANUVIA 50 MG tablet TAKE 1 TABLET BY MOUTH EVERY DAY 90 tablet 2   losartan (COZAAR) 100 MG tablet Take 1 tablet (100 mg total) by mouth daily.     lubiprostone (AMITIZA) 8 MCG capsule TAKE ONE CAPSULE BY MOUTH EACH MORNING 90 capsule 1   metoprolol succinate (TOPROL XL) 25 MG 24 hr tablet Take 1 tablet (25 mg total) by mouth at bedtime. 90 tablet 0   omeprazole (PRILOSEC) 40 MG capsule TAKE 1 CAPSULE BY MOUTH EVERY DAY 90 capsule 1   polyethylene glycol powder (GLYCOLAX/MIRALAX) 17 GM/SCOOP powder Take 17 g by mouth daily as needed. 3350 g 1   rosuvastatin (CRESTOR) 20 MG tablet Take 1 tablet (20 mg total) by mouth daily. 90 tablet 3   torsemide (DEMADEX) 10 MG tablet Take 1  tablet (10 mg total) by mouth 2 (two) times a week. Monday and Friday     No current facility-administered medications for this visit.    Review of Systems: No chest pain. No shortness of breath. No urinary complaints.   Physical Exam  Wt Readings from Last 3 Encounters:  04/20/22 193 lb (87.5 kg)  03/04/22 186 lb (84.4 kg)  02/06/22 185 lb (83.9 kg)    BP 136/60   Pulse (!) 57   Ht _0  (1.803 m)   Wt 195 lb (88.5 kg)   BMI 27.20 kg/m  Constitutional:  Generally well appearing male in no acute distress. Psychiatric: Pleasant. Normal mood and affect. Behavior is normal. EENT: Pupils normal.  Conjunctivae are normal. No scleral icterus. Neck supple.  Cardiovascular: Normal rate, regular rhythm. No edema Pulmonary/chest: Effort normal and breath sounds normal. No wheezing, rales or rhonchi. Abdominal: Soft, nondistended, nontender. Bowel sounds active throughout. There are no masses palpable. No hepatomegaly. Neurological: Alert and oriented to person place and time. Skin: Skin is warm and dry. No rashes noted.  Tye Savoy, NP  04/23/2022, 8:55 AM  Cc:  Tonia Ghent, MD

## 2022-04-23 NOTE — Patient Instructions (Addendum)
Continue Miralax as needed.  Follow up as needed.  It was a pleasure to see you today!  Thank you for trusting me with your gastrointestinal care!

## 2022-04-28 ENCOUNTER — Ambulatory Visit (INDEPENDENT_AMBULATORY_CARE_PROVIDER_SITE_OTHER): Payer: Medicare Other

## 2022-04-28 DIAGNOSIS — I442 Atrioventricular block, complete: Secondary | ICD-10-CM

## 2022-04-28 LAB — CUP PACEART REMOTE DEVICE CHECK
Battery Remaining Longevity: 19 mo
Battery Remaining Percentage: 22 %
Battery Voltage: 2.92 V
Brady Statistic AP VP Percent: 16 %
Brady Statistic AP VS Percent: 1 %
Brady Statistic AS VP Percent: 84 %
Brady Statistic AS VS Percent: 1 %
Brady Statistic RA Percent Paced: 16 %
Brady Statistic RV Percent Paced: 99 %
Date Time Interrogation Session: 20231205020015
Implantable Lead Connection Status: 753985
Implantable Lead Connection Status: 753985
Implantable Lead Implant Date: 20160321
Implantable Lead Implant Date: 20160321
Implantable Lead Location: 753859
Implantable Lead Location: 753860
Implantable Pulse Generator Implant Date: 20160321
Lead Channel Impedance Value: 350 Ohm
Lead Channel Impedance Value: 410 Ohm
Lead Channel Pacing Threshold Amplitude: 0.75 V
Lead Channel Pacing Threshold Amplitude: 1.25 V
Lead Channel Pacing Threshold Pulse Width: 0.5 ms
Lead Channel Pacing Threshold Pulse Width: 0.8 ms
Lead Channel Sensing Intrinsic Amplitude: 12 mV
Lead Channel Sensing Intrinsic Amplitude: 3.3 mV
Lead Channel Setting Pacing Amplitude: 2.5 V
Lead Channel Setting Pacing Amplitude: 3 V
Lead Channel Setting Pacing Pulse Width: 0.5 ms
Lead Channel Setting Sensing Sensitivity: 8 mV
Pulse Gen Model: 2240
Pulse Gen Serial Number: 7734710

## 2022-05-27 NOTE — Progress Notes (Signed)
Remote pacemaker transmission.   

## 2022-06-01 NOTE — Progress Notes (Unsigned)
Cardiology Office Note:    Date:  06/01/2022   ID:  Dean Murphy, DOB Nov 22, 1926, MRN [id number]  PCP:  Tonia Ghent, MD   [facility] Providers Cardiologist:  Ena Dawley, MD Electrophysiologist:  Cristopher Peru, MD  {    Referring MD: Tonia Ghent, MD    History of Present Illness:    Dean Murphy is a 89 y.o. male with a hx of CAD s/p CABG in 1989, CHB s/p PPM placement, carotid artery disease s/p CEA, prior CVA, pAfib on apixaba, HLD, CKD III-IV, DMII, and HFpEF who was previously followed by Dr. Meda Coffee who now returns to clinic for follow-up.  Last seen on 04/2021 where he was doing well without anginal symptoms.   Today, ***  Past Medical History:  Diagnosis Date   Anemia    Arthritis    CAD (coronary artery disease)    a. CABG 1989, b. Myoview low risk 2012.   Carotid artery occlusion    a. Duplex 10/2014: patent R/L CEA with mild hyperplasia in right surgical bulb - followed by vascular.   CHB (complete heart block) [facility]) March 2016   a. s/p STJ dual chamber pacemaker 07/2014.   Chronic diastolic CHF (congestive heart failure) ([place])    a. Dx 01/1637 - acute diastolic CHF in the setting of CHB.   Chronic kidney disease, stage IV (severe) (HCC)    stage III/IV as of 2015, [name]   Colon polyps    Complication of anesthesia    Constipation    CVA (cerebral infarction)    Diabetes mellitus    type II   Diverticulosis    Dupuytren's disease    finger right hand contracted   Essential hypertension    GERD (gastroesophageal reflux disease)    Hemorrhoids    Hyperlipidemia    Myocardial infarction ([place]) 1976  and Mar. 19, 2016   Orthostasis    Osteoporosis    PAF (paroxysmal atrial fibrillation) ([place]) March 2016   PONV (postoperative nausea and vomiting)    Presence of permanent cardiac pacemaker    Shortness of breath dyspnea    due to pain    Past Surgical History:  Procedure Laterality Date   CARDIAC CATHETERIZATION  08/11/2014    Procedure: TEMPORARY PACEMAKER;  Surgeon: [name], MD;  Location: [facility] CATH LAB;  Service: Cardiovascular;;   CAROTID ENDARTERECTOMY  12/31/10   Right   CAROTID ENDARTERECTOMY  02/09/11   left   CHOLECYSTECTOMY     CORONARY ARTERY BYPASS GRAFT  1989   ENDOSCOPIC RETROGRADE CHOLANGIOPANCREATOGRAPHY (ERCP) WITH PROPOFOL N/A 01/27/2022   Procedure: ENDOSCOPIC RETROGRADE CHOLANGIOPANCREATOGRAPHY (ERCP) WITH PROPOFOL;  Surgeon: Ladene Artist, MD;  [facility];  Service: Gastroenterology;  Laterality: N/A;   EYE SURGERY Bilateral    Cataract with implants   I & D KNEE WITH POLY EXCHANGE Left 07/29/2015   Procedure: IRRIGATION AND DEBRIDEMENT LEFT KNEE WITH POLY EXCHANGE;  Surgeon: Rod Can, MD;  Location: [place];  Service: Orthopedics;  Laterality: Left;   IR GENERIC HISTORICAL  03/04/2016   IR US GUIDE VASC ACCESS RIGHT 03/04/2016 Marybelle Killings, MD [facility]   IR GENERIC HISTORICAL  03/04/2016   IR FLUORO GUIDE CV LINE RIGHT 03/04/2016 Marybelle Killings, MD [facility]   JOINT REPLACEMENT     bilat. knees   PERMANENT PACEMAKER INSERTION N/A 08/13/2014   STJ dual chamber pacemaker implanted by [name] for CHB   REMOVAL OF STONES  01/27/2022  Procedure: REMOVAL OF STONES;  Surgeon: Ladene Artist, MD;  Location: [facility];  Service: Gastroenterology;;   Joan Mayans  01/27/2022   Procedure: Joan Mayans;  Surgeon: Ladene Artist, MD;  [facility];  Service: Gastroenterology;;   TONSILLECTOMY     TOTAL KNEE ARTHROPLASTY     bilateral    Current Medications: No outpatient medications have been marked as taking for the 06/04/22 encounter (Appointment) with Freada Bergeron, MD.     Allergies:   Zoledronic acid, Lokelma [sodium zirconium cyclosilicate], and Nsaids   Social History   Socioeconomic History   Marital status: Married    Spouse name: Not on file   Number of children: Not on file   Years of education: Not on file   Highest education  level: Not on file  Occupational History   Occupation: retired    Fish farm manager: RETIRED  Tobacco Use   Smoking status: Former    Packs/day: 1.00    Years: 25.00    Total pack years: 25.00    Types: Cigarettes    Quit date: 01/21/1973    Years since quitting: 49.3    Passive exposure: Past   Smokeless tobacco: Never   Tobacco comments:    began smoking in high school, quit age 89  Vaping Use   Vaping Use: Never used  Substance and Sexual Activity   Alcohol use: Yes    Alcohol/week: 0.0 standard drinks of alcohol    Comment: wine occ   Drug use: No   Sexual activity: Never  Other Topics Concern   Not on file  Social History Narrative   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  Remarried 1998.     3 sons   Social Determinants of Health   Financial Resource Strain: Low Risk  (12/15/2021)   Overall Financial Resource Strain (CARDIA)    Difficulty of Paying Living Expenses: Not hard at all  Food Insecurity: No Food Insecurity (12/15/2021)   Hunger Vital Sign    Worried About Running Out of Food in the Last Year: Never true    Ran Out of Food in the Last Year: Never true  Transportation Needs: No Transportation Needs (12/15/2021)   PRAPARE - Hydrologist (Medical): No    Lack of Transportation (Non-Medical): No  Physical Activity: Inactive (12/15/2021)   Exercise Vital Sign    Days of Exercise per Week: 0 days    Minutes of Exercise per Session: 0 min  Stress: No Stress Concern Present (12/15/2021)   [place]    Feeling of Stress : Not at all  Social Connections: Not on file     Family History: The patient's family history includes Cancer in an other family member; Diabetes in his mother, son, son, and another family member; Heart attack in his brother, father, and mother; Heart disease in his brother, father, and mother; Hypertension in his  brother, father, son, and son; Varicose Veins in his mother. There is no history of Colon cancer.  ROS:   Please see the history of present illness.     Review of Systems  Constitutional:  Negative for chills and fever.  Eyes:  Negative for blurred vision.  Respiratory:  Negative for shortness of breath.   Cardiovascular:  Positive for chest pain. Negative for palpitations, orthopnea, claudication, leg swelling and PND.  Gastrointestinal:  Negative for nausea and vomiting.  Genitourinary:  Negative for flank pain.  Musculoskeletal:  Negative for falls.  Neurological:  Negative for dizziness and loss of consciousness.     EKGs/Labs/Other Studies Reviewed:    The following studies were reviewed today: 2D echo 08/2017 Study Conclusions   - Left ventricle: The cavity size was normal. There was moderate    concentric hypertrophy. Systolic function was normal. The    estimated ejection fraction was in the range of 55% to 60%.    Hypokinesis of the apical septal myocardium. Doppler parameters    are consistent with abnormal left ventricular relaxation (grade 1    diastolic dysfunction). Doppler parameters are consistent with    indetermiante ventricular filling pressure.  - Aortic valve: Transvalvular velocity was within the normal range.    There was no stenosis. There was no regurgitation.  - Mitral valve: Transvalvular velocity was within the normal range.    There was no evidence for stenosis. There was mild regurgitation.  - Left atrium: The atrium was severely dilated.  - Right ventricle: The cavity size was normal. Wall thickness was    normal. Systolic function was normal.  - Atrial septum: No defect or patent foramen ovale was identified    by color flow Doppler.  - Tricuspid valve: There was trivial regurgitation.  - Pulmonary arteries: Systolic pressure was mildly increased. PA    peak pressure: 41 mm Hg (S).   EKG:  EKG 07/2020: NSR, V-pacing   Recent Labs: 12/18/2021:  TSH 3.50 04/20/2022: ALT 25; BUN 40; Creatinine, Ser 2.73; Hemoglobin 12.4; Platelets 86.0; Potassium 4.7; Sodium 144  Recent Lipid Panel    Component Value Date/Time   CHOL 148 12/17/2020 1149   TRIG 160.0 (H) 12/17/2020 1149   TRIG 62 04/28/2006 0917   HDL 43.00 12/17/2020 1149   CHOLHDL 3 12/17/2020 1149   VLDL 32.0 12/17/2020 1149   LDLCALC 73 12/17/2020 1149   LDLCALC 71 04/12/2020 1705   LDLDIRECT 135.1 03/12/2014 0812     Risk Assessment/Calculations:    CHA2DS2-VASc Score = 8  This indicates a 10.8% annual risk of stroke. The patient's score is based upon: CHF History: 1 HTN History: 1 Diabetes History: 1 Stroke History: 2 Vascular Disease History: 1 Age Score: 2 Gender Score: 0          Physical Exam:    VS:  There were no vitals taken for this visit.    Wt Readings from Last 3 Encounters:  04/23/22 195 lb (88.5 kg)  04/20/22 193 lb (87.5 kg)  03/04/22 186 lb (84.4 kg)     GEN: Elderly, very well appearing, NAD HEENT: Normal NECK: No JVD; No carotid bruits CARDIAC: RRR, soft systolic murmur. No rubs or gallops RESPIRATORY:  Clear to auscultation without rales, wheezing or rhonchi  ABDOMEN: Soft, non-tender, non-distended MUSCULOSKELETAL:  No edema; No deformity  SKIN: Warm and dry NEUROLOGIC:  Alert and oriented x 3 PSYCHIATRIC:  Normal affect   ASSESSMENT:    No diagnosis found.  PLAN:    In order of problems listed above:  #CAD s/p CABG in 1989: Doing very well with minimal anginal symptoms. Able to mow his yard, rake leaves and remain active without issues. Has very rare episodes of chest pressure that resolves with taking 2 aspirin (2-3 episodes over the last year) -Continue crestor '20mg'$  daily -Not on ASA due to need for apixaban -Continue losartan '100mg'$  daily -Continue metoprolol '25mg'$  XL daily  #HFpEF: LVEF 55-60% with G1DD. Currently euvolemic with NYHA class I  symptoms. -Continue torsemide '10mg'$  daily -Continue losartan '100mg'$   daily -Continue metoprolol '25mg'$  XL daily -Given advanced age and desire to limit medications, will defer MRA/SGLT2i for now -Monitor daily weights  #Carotid Artery Disease s/p bilateral CEA: Stable on carotid ultrasound in 2019. No further screening needed unless bruit or symptoms. -Continue crestor '20mg'$  daily -Not on ASA due to need for apixaban  #CHB s/p PPM placement: Followed by Dr. Lovena Le. Doing very well. -Follow-up with Dr. Lovena Le as scheduled  #pAfib: CHADs-vasc 5. Doing well with no palpitations. Tolerating apixaban without issues.  -Continue apixaban 2.'5mg'$  BID -Continue metop '25mg'$  XLdaily  #HLD: LDL 73. Ideally would be <55, however, given age and minimal additional benefit will continue current regimen. -Continue crestor '20mg'$  daily  #CKD III-IV: -Followed by Dr. Candiss Norse      Medication Adjustments/Labs and Tests Ordered: Current medicines are reviewed at length with the patient today.  Concerns regarding medicines are outlined above.  No orders of the defined types were placed in this encounter.  No orders of the defined types were placed in this encounter.    There are no Patient Instructions on file for this visit.   Signed, Freada Bergeron, MD  06/01/2022 1:49 PM    [facility]

## 2022-06-04 ENCOUNTER — Ambulatory Visit: Payer: Medicare Other | Attending: Cardiology | Admitting: Cardiology

## 2022-06-04 ENCOUNTER — Encounter: Payer: Self-pay | Admitting: Cardiology

## 2022-06-04 VITALS — BP 162/87 | HR 61 | Ht 71.0 in | Wt 193.6 lb

## 2022-06-04 DIAGNOSIS — Z951 Presence of aortocoronary bypass graft: Secondary | ICD-10-CM | POA: Diagnosis not present

## 2022-06-04 DIAGNOSIS — I480 Paroxysmal atrial fibrillation: Secondary | ICD-10-CM

## 2022-06-04 DIAGNOSIS — I10 Essential (primary) hypertension: Secondary | ICD-10-CM | POA: Diagnosis not present

## 2022-06-04 DIAGNOSIS — I442 Atrioventricular block, complete: Secondary | ICD-10-CM

## 2022-06-04 DIAGNOSIS — I25810 Atherosclerosis of coronary artery bypass graft(s) without angina pectoris: Secondary | ICD-10-CM

## 2022-06-04 DIAGNOSIS — I5032 Chronic diastolic (congestive) heart failure: Secondary | ICD-10-CM

## 2022-06-04 DIAGNOSIS — E7800 Pure hypercholesterolemia, unspecified: Secondary | ICD-10-CM

## 2022-06-04 DIAGNOSIS — Z950 Presence of cardiac pacemaker: Secondary | ICD-10-CM

## 2022-06-04 DIAGNOSIS — E785 Hyperlipidemia, unspecified: Secondary | ICD-10-CM

## 2022-06-04 NOTE — Progress Notes (Signed)
Cardiology Office Note:    Date:  06/04/2022   ID:  Dean Murphy, DOB 10-08-1926, MRN [id number]  PCP:  Tonia Ghent, MD   [facility] Providers Cardiologist:  Ena Dawley, MD Electrophysiologist:  Cristopher Peru, MD  {    Referring MD: Tonia Ghent, MD    History of Present Illness:    Dean Murphy is a 89 y.o. male with a hx of CAD s/p CABG in 1989, CHB s/p PPM placement, carotid artery disease s/p CEA, prior CVA, pAfib on apixaba, HLD, CKD III-IV, DMII, and HFpEF who was previously followed by Dr. Meda Coffee who now returns to clinic for follow-up.  Last seen on 04/2021 where he was doing well without anginal symptoms.   Today, he reports that he is doing well. He has been tolerating his medications well and has not had chest pain, lightheadedness or dizziness. His blood pressure was high in office but he could not recall what it was when he monitoring it at home. He acknowledged that he has not been monitoring it closely at home lately. On retake it was 162/87 (initially 150/82)  He mentioned that he thought he would need to replace the pacemaker soon. We will arrange follow-up with Dr. Lovena Le.  He denies any palpitations, chest pain, shortness of breath, or peripheral edema. No lightheadedness, headaches, syncope, orthopnea, or PND.   Past Medical History:  Diagnosis Date   Anemia    Arthritis    CAD (coronary artery disease)    a. CABG 1989, b. Myoview low risk 2012.   Carotid artery occlusion    a. Duplex 10/2014: patent R/L CEA with mild hyperplasia in right surgical bulb - followed by vascular.   CHB (complete heart block) [facility]) March 2016   a. s/p STJ dual chamber pacemaker 07/2014.   Chronic diastolic CHF (congestive heart failure) ([place])    a. Dx 10/2692 - acute diastolic CHF in the setting of CHB.   Chronic kidney disease, stage IV (severe) (HCC)    stage III/IV as of 2015, [name]   Colon polyps    Complication of anesthesia    Constipation     CVA (cerebral infarction)    Diabetes mellitus    type II   Diverticulosis    Dupuytren's disease    finger right hand contracted   Essential hypertension    GERD (gastroesophageal reflux disease)    Hemorrhoids    Hyperlipidemia    Myocardial infarction ([place]) 1976  and Mar. 19, 2016   Orthostasis    Osteoporosis    PAF (paroxysmal atrial fibrillation) ([place]) March 2016   PONV (postoperative nausea and vomiting)    Presence of permanent cardiac pacemaker    Shortness of breath dyspnea    due to pain    Past Surgical History:  Procedure Laterality Date   CARDIAC CATHETERIZATION  08/11/2014   Procedure: TEMPORARY PACEMAKER;  Surgeon: [name], MD;  Location: [facility] CATH LAB;  Service: Cardiovascular;;   CAROTID ENDARTERECTOMY  12/31/10   Right   CAROTID ENDARTERECTOMY  02/09/11   left   CHOLECYSTECTOMY     CORONARY ARTERY BYPASS GRAFT  1989   ENDOSCOPIC RETROGRADE CHOLANGIOPANCREATOGRAPHY (ERCP) WITH PROPOFOL N/A 01/27/2022   Procedure: ENDOSCOPIC RETROGRADE CHOLANGIOPANCREATOGRAPHY (ERCP) WITH PROPOFOL;  Surgeon: Ladene Artist, MD;  Location: [facility] END[facility];  Service: Gastroenterology;  Laterality: N/A;   EYE SURGERY Bilateral    Cataract with implants   I & D KNEE WITH POLY EXCHANGE Left 07/29/2015  Procedure: IRRIGATION AND DEBRIDEMENT LEFT KNEE WITH POLY EXCHANGE;  Surgeon: Rod Can, MD;  Location: [place];  Service: Orthopedics;  Laterality: Left;   IR GENERIC HISTORICAL  03/04/2016   IR US GUIDE VASC ACCESS RIGHT 03/04/2016 Marybelle Killings, MD [facility]   IR GENERIC HISTORICAL  03/04/2016   IR FLUORO GUIDE CV LINE RIGHT 03/04/2016 Marybelle Killings, MD [facility]   JOINT REPLACEMENT     bilat. knees   PERMANENT PACEMAKER INSERTION N/A 08/13/2014   STJ dual chamber pacemaker implanted by [name] for [place]  01/27/2022   Procedure: REMOVAL OF STONES;  Surgeon: Ladene Artist, MD;  Location: [facility];  Service: Gastroenterology;;   Joan Mayans   01/27/2022   Procedure: Joan Mayans;  Surgeon: Ladene Artist, MD;  Location: [facility];  Service: Gastroenterology;;   TONSILLECTOMY     TOTAL KNEE ARTHROPLASTY     bilateral    Current Medications: Current Meds  Medication Sig   amoxicillin (AMOXIL) 500 MG capsule Take 1 capsule (500 mg total) by mouth 2 (two) times daily.   B-D UF III MINI PEN NEEDLES 31G X 5 MM MISC USE DAILY WITH INSULIN PEN   cholecalciferol (VITAMIN D3) 25 MCG (1000 UNIT) tablet Take 1,000 Units by mouth daily.   clotrimazole-betamethasone (LOTRISONE) cream APPLY TO AFFECTED AREA TWICE A DAY   dorzolamide-timolol (COSOPT) 22.3-6.8 MG/ML ophthalmic solution 1 drop daily in the afternoon. In both eye   ELIQUIS 2.5 MG TABS tablet TAKE 1 TABLET BY MOUTH TWICE A DAY   ferrous sulfate 325 (65 FE) MG tablet TAKE 1 TABLET BY MOUTH ON FRIDAY   glipiZIDE (GLUCOTROL) 5 MG tablet TAKE 2 TABLETS BY MOUTH EVERY MORNING BEFORE BREAKFAST AND 1 TABLET BEFORE SUPPER   insulin glargine (LANTUS SOLOSTAR) 100 UNIT/ML Solostar Pen Inject 34 Units into the skin daily.   JANUVIA 50 MG tablet TAKE 1 TABLET BY MOUTH EVERY DAY   losartan (COZAAR) 100 MG tablet Take 1 tablet (100 mg total) by mouth daily.   lubiprostone (AMITIZA) 8 MCG capsule TAKE ONE CAPSULE BY MOUTH EACH MORNING   metoprolol succinate (TOPROL XL) 25 MG 24 hr tablet Take 1 tablet (25 mg total) by mouth at bedtime.   omeprazole (PRILOSEC) 40 MG capsule TAKE 1 CAPSULE BY MOUTH EVERY DAY   polyethylene glycol powder (GLYCOLAX/MIRALAX) 17 GM/SCOOP powder Take 17 g by mouth daily as needed.   rosuvastatin (CRESTOR) 20 MG tablet Take 1 tablet (20 mg total) by mouth daily.   torsemide (DEMADEX) 10 MG tablet Take 1 tablet (10 mg total) by mouth 2 (two) times a week. Monday and Friday     Allergies:   Zoledronic acid, Lokelma [sodium zirconium cyclosilicate], and Nsaids   Social History   Socioeconomic History   Marital status: Married    Spouse name: Not on file    Number of children: Not on file   Years of education: Not on file   Highest education level: Not on file  Occupational History   Occupation: retired    Fish farm manager: RETIRED  Tobacco Use   Smoking status: Former    Packs/day: 1.00    Years: 25.00    Total pack years: 25.00    Types: Cigarettes    Quit date: 01/21/1973    Years since quitting: 49.4    Passive exposure: Past   Smokeless tobacco: Never   Tobacco comments:    began smoking in high school, quit age 55  Vaping Use  Vaping Use: Never used  Substance and Sexual Activity   Alcohol use: Yes    Alcohol/week: 0.0 standard drinks of alcohol    Comment: wine occ   Drug use: No   Sexual activity: Never  Other Topics Concern   Not on file  Social History Narrative   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  Remarried 1998.     3 sons   Social Determinants of Health   Financial Resource Strain: Low Risk  (12/15/2021)   Overall Financial Resource Strain (CARDIA)    Difficulty of Paying Living Expenses: Not hard at all  Food Insecurity: No Food Insecurity (12/15/2021)   Hunger Vital Sign    Worried About Running Out of Food in the Last Year: Never true    Ran Out of Food in the Last Year: Never true  Transportation Needs: No Transportation Needs (12/15/2021)   PRAPARE - Hydrologist (Medical): No    Lack of Transportation (Non-Medical): No  Physical Activity: Inactive (12/15/2021)   Exercise Vital Sign    Days of Exercise per Week: 0 days    Minutes of Exercise per Session: 0 min  Stress: No Stress Concern Present (12/15/2021)   [place]    Feeling of Stress : Not at all  Social Connections: Not on file     Family History: The patient's family history includes Cancer in an other family member; Diabetes in his mother, son, son, and another family member; Heart attack in his brother, father,  and mother; Heart disease in his brother, father, and mother; Hypertension in his brother, father, son, and son; Varicose Veins in his mother. There is no history of Colon cancer.  ROS:   Please see the history of present illness.     Review of Systems  Constitutional:  Negative for chills and fever.  Eyes:  Negative for blurred vision.  Respiratory:  Negative for shortness of breath.   Cardiovascular:  Negative for chest pain, palpitations, orthopnea, claudication, leg swelling and PND.  Gastrointestinal:  Negative for nausea and vomiting.  Genitourinary:  Negative for flank pain.  Musculoskeletal:  Negative for falls.  Neurological:  Negative for dizziness and loss of consciousness.     EKGs/Labs/Other Studies Reviewed:    The following studies were reviewed today: 2D echo 08/2017 Study Conclusions   - Left ventricle: The cavity size was normal. There was moderate    concentric hypertrophy. Systolic function was normal. The    estimated ejection fraction was in the range of 55% to 60%.    Hypokinesis of the apical septal myocardium. Doppler parameters    are consistent with abnormal left ventricular relaxation (grade 1    diastolic dysfunction). Doppler parameters are consistent with    indetermiante ventricular filling pressure.  - Aortic valve: Transvalvular velocity was within the normal range.    There was no stenosis. There was no regurgitation.  - Mitral valve: Transvalvular velocity was within the normal range.    There was no evidence for stenosis. There was mild regurgitation.  - Left atrium: The atrium was severely dilated.  - Right ventricle: The cavity size was normal. Wall thickness was    normal. Systolic function was normal.  - Atrial septum: No defect or patent foramen ovale was identified    by color flow Doppler.  - Tricuspid valve: There was trivial regurgitation.  - Pulmonary arteries:  Systolic pressure was mildly increased. PA    peak pressure: 41 mm Hg  (S).   EKG:   06/04/22: A-sensed, v-paced 61 bpm EKG 07/2020: NSR, V-pacing   Recent Labs: 12/18/2021: TSH 3.50 04/20/2022: ALT 25; BUN 40; Creatinine, Ser 2.73; Hemoglobin 12.4; Platelets 86.0; Potassium 4.7; Sodium 144  Recent Lipid Panel    Component Value Date/Time   CHOL 148 12/17/2020 1149   TRIG 160.0 (H) 12/17/2020 1149   TRIG 62 04/28/2006 0917   HDL 43.00 12/17/2020 1149   CHOLHDL 3 12/17/2020 1149   VLDL 32.0 12/17/2020 1149   LDLCALC 73 12/17/2020 1149   LDLCALC 71 04/12/2020 1705   LDLDIRECT 135.1 03/12/2014 0812     Risk Assessment/Calculations:    CHA2DS2-VASc Score = 8  This indicates a 10.8% annual risk of stroke. The patient's score is based upon: CHF History: 1 HTN History: 1 Diabetes History: 1 Stroke History: 2 Vascular Disease History: 1 Age Score: 2 Gender Score: 0          Physical Exam:    VS:  BP (!) 162/87 (BP Location: Right Arm, Patient Position: Sitting, Cuff Size: Normal)   Pulse 61   Ht '5\' 11"'$  (1.803 m)   Wt 193 lb 9.6 oz (87.8 kg)   SpO2 98%   BMI 27.00 kg/m     Wt Readings from Last 3 Encounters:  06/04/22 193 lb 9.6 oz (87.8 kg)  04/23/22 195 lb (88.5 kg)  04/20/22 193 lb (87.5 kg)     GEN: Elderly, very well appearing, NAD HEENT: Normal NECK: No JVD; No carotid bruits CARDIAC: RRR, 2/6 systolic murmur. No rubs or gallops RESPIRATORY:  Clear to auscultation without rales, wheezing or rhonchi  ABDOMEN: Soft, non-tender, non-distended MUSCULOSKELETAL:  No edema; No deformity  SKIN: Warm and dry NEUROLOGIC:  Alert and oriented x 3 PSYCHIATRIC:  Normal affect   ASSESSMENT:    1. Coronary artery disease involving coronary bypass graft of native heart without angina pectoris   2. Complete heart block ([place])   3. Essential hypertension   4. S/P CABG x 5 1989. Low risk Myoview 2012   5. Paroxysmal atrial fibrillation (HCC)   6. Hypercholesterolemia   7. Chronic diastolic CHF (congestive heart failure) ([place])   8.  Hyperlipidemia, unspecified hyperlipidemia type   9. Pacemaker    PLAN:    In order of problems listed above:  #CAD s/p CABG in 1989: Doing very well with minimal anginal symptoms. Able to mow his yard, rake leaves and remain active without issues. -Continue crestor '20mg'$  daily -Not on ASA due to need for apixaban -Continue losartan '100mg'$  daily -Continue metoprolol '25mg'$  XL daily  #HTN: Elevated to 160s on exam today. Does not watch regularly at home but states he was always within normal range in the past. Will keep 2 week BP log and arrange for follow-up with PA to review. Told him if his numbers were normal at home, he can cancel that visit. Will keep meds as is for now pending home readings.  -Continue losartan '100mg'$  daily -Continue metoprolol '25mg'$  XL daily  #HFpEF: LVEF 55-60% with G1DD. Currently euvolemic with NYHA class I-II symptoms. -Continue torsemide '10mg'$  daily -Continue losartan '100mg'$  daily -Continue metoprolol '25mg'$  XL daily -Given advanced age and desire to limit medications, will defer MRA/SGLT2i for now -Monitor daily weights  #Carotid Artery Disease s/p bilateral CEA: Stable on carotid ultrasound in 2019. No further screening needed unless bruit or symptoms. -Continue crestor '20mg'$  daily -Not on ASA due  to need for apixaban  #CHB s/p PPM placement: Followed by Dr. Lovena Le. Doing very well. -Follow-up with Dr. Lovena Le as scheduled; will arrange follow-up for him today  #pAfib: CHADs-vasc 5. Doing well with no palpitations. Tolerating apixaban without issues.  -Continue apixaban 2.'5mg'$  BID -Continue metop '25mg'$  XLdaily  #HLD: LDL 73. Ideally would be <55, however, given age and minimal additional benefit will continue current regimen. -Continue crestor '20mg'$  daily  #CKD III-IV: -Followed by Dr. Candiss Norse    Follow Up: 2 weeks with PA to check blood pressure log. 6 months follow up with me.   Medication Adjustments/Labs and Tests Ordered: Current medicines are  reviewed at length with the patient today.  Concerns regarding medicines are outlined above.  Orders Placed This Encounter  Procedures   EKG 12-Lead   No orders of the defined types were placed in this encounter.    Patient Instructions  Medication Instructions:  Your physician recommends that you continue on your current medications as directed. Please refer to the Current Medication list given to you today.  *If you need a refill on your cardiac medications before your next appointment, please call your pharmacy*   Lab Work: None ordered.  If you have labs (blood work) drawn today and your tests are completely normal, you will receive your results only by: [place] (if you have MyChart) OR A paper copy in the mail If you have any lab test that is abnormal or we need to change your treatment, we will call you to review the results.   Testing/Procedures: None ordered.    Follow-Up: At [facility], you and your health needs are our priority.  As part of our continuing mission to provide you with exceptional heart care, we have created designated Provider Care Teams.  These Care Teams include your primary Cardiologist (physician) and Advanced Practice Providers (APPs -  Physician Assistants and Nurse Practitioners) who all work together to provide you with the care you need, when you need it.  We recommend signing up for the patient portal called "MyChart".  Sign up information is provided on this After Visit Summary.  MyChart is used to connect with patients for Virtual Visits (Telemedicine).  Patients are able to view lab/test results, encounter notes, upcoming appointments, etc.  Non-urgent messages can be sent to your provider as well.   To learn more about what you can do with MyChart, go to [email or web address].    Your next appointment:   [name] - March 2024  2 weeks with APP  6 months with Dr Johney Frame  Please keep a log of your blood pressures  for the next 2 weeks and bring with you to your appointment    I,Jessica Ford,acting as a scribe for Freada Bergeron, MD.,have documented all relevant documentation on the behalf of Freada Bergeron, MD,as directed by  Freada Bergeron, MD while in the presence of Freada Bergeron, MD.   I, Freada Bergeron, MD, have reviewed all documentation for this visit. The documentation on 06/04/22 for the exam, diagnosis, procedures, and orders are all accurate and complete.   Signed, Freada Bergeron, MD  06/04/2022 8:56 AM    [place]

## 2022-06-04 NOTE — Patient Instructions (Addendum)
Medication Instructions:  Your physician recommends that you continue on your current medications as directed. Please refer to the Current Medication list given to you today.  *If you need a refill on your cardiac medications before your next appointment, please call your pharmacy*   Lab Work: None ordered.  If you have labs (blood work) drawn today and your tests are completely normal, you will receive your results only by: [place] (if you have MyChart) OR A paper copy in the mail If you have any lab test that is abnormal or we need to change your treatment, we will call you to review the results.   Testing/Procedures: None ordered.    Follow-Up: At [facility], you and your health needs are our priority.  As part of our continuing mission to provide you with exceptional heart care, we have created designated Provider Care Teams.  These Care Teams include your primary Cardiologist (physician) and Advanced Practice Providers (APPs -  Physician Assistants and Nurse Practitioners) who all work together to provide you with the care you need, when you need it.  We recommend signing up for the patient portal called "MyChart".  Sign up information is provided on this After Visit Summary.  MyChart is used to connect with patients for Virtual Visits (Telemedicine).  Patients are able to view lab/test results, encounter notes, upcoming appointments, etc.  Non-urgent messages can be sent to your provider as well.   To learn more about what you can do with MyChart, go to [email or web address].    Your next appointment:   [name] - March 2024  2 weeks with APP  6 months with Dr Johney Frame  Please keep a log of your blood pressures for the next 2 weeks and bring with you to your appointment

## 2022-06-17 ENCOUNTER — Other Ambulatory Visit: Payer: Self-pay | Admitting: Family Medicine

## 2022-06-18 ENCOUNTER — Other Ambulatory Visit: Payer: Self-pay | Admitting: *Deleted

## 2022-06-18 MED ORDER — METOPROLOL SUCCINATE ER 25 MG PO TB24: 25.0000 mg | ORAL_TABLET | Freq: Every day | ORAL | 3 refills | Status: DC

## 2022-06-19 ENCOUNTER — Ambulatory Visit: Payer: Medicare Other | Admitting: Nurse Practitioner

## 2022-07-22 ENCOUNTER — Other Ambulatory Visit: Payer: Self-pay

## 2022-07-22 ENCOUNTER — Ambulatory Visit: Payer: Medicare Other | Admitting: Internal Medicine

## 2022-07-22 DIAGNOSIS — T8454XD Infection and inflammatory reaction due to internal left knee prosthesis, subsequent encounter: Secondary | ICD-10-CM | POA: Diagnosis not present

## 2022-07-22 MED ORDER — AMOXICILLIN 500 MG PO CAPS: 500.0000 mg | ORAL_CAPSULE | Freq: Every day | ORAL | 3 refills | Status: DC

## 2022-07-22 NOTE — Progress Notes (Signed)
[facility]  Patient Active Problem List   Diagnosis Date Noted   Infection of prosthetic left knee joint ([place]) 07/29/2015    Priority: High   Abnormal CT of the abdomen    Biliary obstruction    Elevated LFTs    Inguinal hernia 01/26/2022   Alkaline phosphatase elevation 12/21/2021   Compression fracture of L1 lumbar vertebra ([place]) 12/21/2021   Recurrent falls 07/17/2021   Chronic diastolic heart failure () 08/05/2020   Fall at home 04/14/2020   Healthcare maintenance 07/21/2018   Advance care planning 07/21/2018   Constipation    Low back pain 02/02/2018   Coronary artery disease 07/29/2015   Stroke ([place]) 12/24/2014   Double vision 12/23/2014   Pacemaker 11/16/2014   Anemia 09/27/2014   Gastrointestinal hemorrhage associated with peptic ulcer 09/07/2014   Paroxysmal atrial fibrillation ([place]) 08/13/2014   Chest pain 08/11/2014   Complete heart block ([place]) 08/11/2014   S/P CABG x 5 1989. Low risk Myoview 2012 08/11/2014   Syncope 08/11/2014   Cough 06/14/2014   Left foot pain 02/23/2014   Gouty arthropathy 08/09/2013   Chronic radicular lumbar pain 12/15/2012   PVD- s/p bilat CEA- CA dopplers OK Jan 2015 10/06/2012   Insomnia 09/30/2011   OTHER SPECIFIED SITES OF SPRAINS AND STRAINS 04/25/2010   DIVERTICULITIS OF COLON 02/21/2010   TEMPOROMANDIBULAR JOINT DISORDER 11/22/2009   DM (diabetes mellitus), type 2 with renal complications ([place]) [id number]   Chronic kidney disease, stage III (moderate) ([place]) 07/26/2008   CHRONIC PROSTATITIS 07/26/2008   ADVEF, DRUG/MEDICINAL/BIOLOGICAL SUBST NOS 03/16/2007   Dyslipidemia 11/15/2006   Essential hypertension 11/15/2006   GERD 11/15/2006   Osteoarthritis 11/15/2006    Patient's Medications  New Prescriptions   No medications on file  Previous Medications   B-D UF III MINI PEN NEEDLES 31G X 5 MM MISC    USE DAILY WITH INSULIN PEN   CHOLECALCIFEROL (VITAMIN D3) 25 MCG (1000 UNIT)  TABLET    Take 1,000 Units by mouth daily.   CLOTRIMAZOLE-BETAMETHASONE (LOTRISONE) CREAM    APPLY TO AFFECTED AREA TWICE A DAY   DORZOLAMIDE-TIMOLOL (COSOPT) 22.3-6.8 MG/ML OPHTHALMIC SOLUTION    1 drop daily in the afternoon. In both eye   ELIQUIS 2.5 MG TABS TABLET    TAKE 1 TABLET BY MOUTH TWICE A DAY   FERROUS SULFATE 325 (65 FE) MG TABLET    TAKE 1 TABLET BY MOUTH ON FRIDAY   GLIPIZIDE (GLUCOTROL) 5 MG TABLET    TAKE 2 TABLETS BY MOUTH EVERY MORNING BEFORE BREAKFAST AND 1 TABLET BEFORE SUPPER   INSULIN GLARGINE (LANTUS SOLOSTAR) 100 UNIT/ML SOLOSTAR PEN    INJECT 34 UNITS INTO THE SKIN DAILY   JANUVIA 50 MG TABLET    TAKE 1 TABLET BY MOUTH EVERY DAY   LOSARTAN (COZAAR) 100 MG TABLET    Take 1 tablet (100 mg total) by mouth daily.   LUBIPROSTONE (AMITIZA) 8 MCG CAPSULE    TAKE ONE CAPSULE BY MOUTH EACH MORNING   METOPROLOL SUCCINATE (TOPROL XL) 25 MG 24 HR TABLET    Take 1 tablet (25 mg total) by mouth at bedtime.   OMEPRAZOLE (PRILOSEC) 40 MG CAPSULE    TAKE 1 CAPSULE BY MOUTH EVERY DAY   POLYETHYLENE GLYCOL POWDER (GLYCOLAX/MIRALAX) 17 GM/SCOOP POWDER    Take 17 g by mouth daily as needed.   ROSUVASTATIN (CRESTOR) 20 MG TABLET    Take 1 tablet (20 mg total) by mouth daily.  TORSEMIDE (DEMADEX) 10 MG TABLET    Take 1 tablet (10 mg total) by mouth 2 (two) times a week. Monday and Friday  Modified Medications   Modified Medication Previous Medication   AMOXICILLIN (AMOXIL) 500 MG CAPSULE amoxicillin (AMOXIL) 500 MG capsule      Take 1 capsule (500 mg total) by mouth daily.    Take 1 capsule (500 mg total) by mouth 2 (two) times daily.  Discontinued Medications   No medications on file    Subjective: Dean Murphy is in for his routine follow-up visit.  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  He completed 6 months of antibiotic therapy but had an early relapse of his infection and has been on amoxicillin ever since that time.  He has never had any problems  tolerating amoxicillin.  He is not having any significant pain in his left knee.  There is no change in the slight, chronic swelling.  He tells me that he had another fall last month when he tripped on the corner of the plastic mat that since under his desk chair.  He says that his head bounced off the floor like a "rubber ball".  He had a routine follow-up visit with his ophthalmologist shortly after the fall and realized that he could not read any of the letters on the chart on the wall.  He was referred to another eye doctor who noticed some fluid behind his cornea in his left eye.  He was put on some over-the-counter medication and improvement was noted over the next few weeks.  He is currently not having any problems with his vision.  He has not had any other falls recently.    Review of Systems: Review of Systems  Constitutional:  Negative for fever.  Gastrointestinal:  Negative for abdominal pain, diarrhea, nausea and vomiting.  Musculoskeletal:  Negative for joint pain.    Past Medical History:  Diagnosis Date   Anemia    Arthritis    CAD (coronary artery disease)    a. CABG 1989, b. Myoview low risk 2012.   Carotid artery occlusion    a. Duplex 10/2014: patent R/L CEA with mild hyperplasia in right surgical bulb - followed by vascular.   CHB (complete heart block) [facility]) March 2016   a. s/p STJ dual chamber pacemaker 07/2014.   Chronic diastolic CHF (congestive heart failure) ([place])    a. Dx [id number] - acute diastolic CHF in the setting of CHB.   Chronic kidney disease, stage IV (severe) (HCC)    stage III/IV as of 2015, [name]   Colon polyps    Complication of anesthesia    Constipation    CVA (cerebral infarction)    Diabetes mellitus    type II   Diverticulosis    Dupuytren's disease    finger right hand contracted   Essential hypertension    GERD (gastroesophageal reflux disease)    Hemorrhoids    Hyperlipidemia    Myocardial infarction ([place]) 1976  and Mar.  19, 2016   Orthostasis    Osteoporosis    PAF (paroxysmal atrial fibrillation) ([place]) March 2016   PONV (postoperative nausea and vomiting)    Presence of permanent cardiac pacemaker    Shortness of breath dyspnea    due to pain    Social History   Tobacco Use   Smoking status: Former    Packs/day: 1.00    Years: 25.00    Total pack years: 25.00  Types: Cigarettes    Quit date: 01/21/1973    Years since quitting: 49.5    Passive exposure: Past   Smokeless tobacco: Never   Tobacco comments:    began smoking in high school, quit age 69  Vaping Use   Vaping Use: Never used  Substance Use Topics   Alcohol use: Yes    Alcohol/week: 0.0 standard drinks of alcohol    Comment: wine occ   Drug use: No    Family History  Problem Relation Age of Onset   Heart disease Mother        Before age 43   Diabetes Mother    Varicose Veins Mother    Heart attack Mother    Heart attack Father    Heart disease Father        After age 71   Hypertension Father    Heart disease Brother        Before age 53   Hypertension Brother    Heart attack Brother    Diabetes Son    Hypertension Son    Hypertension Son    Diabetes Son    Diabetes Other    Cancer Other    Colon cancer Neg Hx     Allergies  Allergen Reactions   Zoledronic Acid Other (See Comments)    Stopped 2015 due to Creatine  Other reaction(s): Unknown Stopped 2015 due to Cr   Lokelma [Sodium Zirconium Cyclosilicate]     Nausea/vomiting   Nsaids Other (See Comments)    Held due to creatinine elevation.      Objective: Vitals:   07/22/22 0910  BP: (!) 167/63  Pulse: 71  Resp: 16  Temp: 97.8 F (36.6 C)  TempSrc: Oral  SpO2: 97%  Weight: 193 lb 12.8 oz (87.9 kg)   Body mass index is 27.03 kg/m.  Physical Exam Constitutional:      Comments: He is in good spirits.    Cardiovascular:     Rate and Rhythm: Normal rate.  Pulmonary:     Effort: Pulmonary effort is normal.  Musculoskeletal:     Comments:  His left knee incision remains fully healed.  There is no unusual swelling, warmth or redness of his left knee.  He has good range of motion.  Skin:    Findings: No rash.  Psychiatric:        Mood and Affect: Mood normal.     Lab Results ESR and CRP normal 06/2020   Problem List Items Addressed This Visit       High   Infection of prosthetic left knee joint ([place])    He is aware that his enterococcal left prosthetic knee infection may be cured.  However, I have reminded him that the only true test of cure would be to stop amoxicillin and wait and see what happens.  Since he has had no problems tolerating it so far he prefers to stay on amoxicillin for now.  Given his current renal function I will decrease his dose to 500 mg daily.  I will repeat his inflammatory markers today and have him follow-up in 1 year.      Relevant Medications   amoxicillin (AMOXIL) 500 MG capsule   Other Relevant Orders   C-reactive protein   Sedimentation rate    Michel Bickers, MD [facility] [place] [phone] pager   [phone] cell 07/22/2022, 9:35 AM

## 2022-07-22 NOTE — Assessment & Plan Note (Signed)
He is aware that his enterococcal left prosthetic knee infection may be cured.  However, I have reminded him that the only true test of cure would be to stop amoxicillin and wait and see what happens.  Since he has had no problems tolerating it so far he prefers to stay on amoxicillin for now.  Given his current renal function I will decrease his dose to 500 mg daily.  I will repeat his inflammatory markers today and have him follow-up in 1 year.

## 2022-07-23 LAB — SEDIMENTATION RATE: Sed Rate: 11 mm/h (ref 0–20)

## 2022-07-23 LAB — C-REACTIVE PROTEIN: CRP: 1.6 mg/L (ref <8.0)

## 2022-07-28 ENCOUNTER — Ambulatory Visit (INDEPENDENT_AMBULATORY_CARE_PROVIDER_SITE_OTHER): Payer: Medicare Other

## 2022-07-28 DIAGNOSIS — I442 Atrioventricular block, complete: Secondary | ICD-10-CM | POA: Diagnosis not present

## 2022-07-29 LAB — CUP PACEART REMOTE DEVICE CHECK
Battery Remaining Longevity: 16 mo
Battery Remaining Percentage: 19 %
Battery Voltage: 2.9 V
Brady Statistic AP VP Percent: 16 %
Brady Statistic AP VS Percent: 1 %
Brady Statistic AS VP Percent: 84 %
Brady Statistic AS VS Percent: 1 %
Brady Statistic RA Percent Paced: 15 %
Brady Statistic RV Percent Paced: 99 %
Date Time Interrogation Session: 20240305020014
Implantable Lead Connection Status: 753985
Implantable Lead Connection Status: 753985
Implantable Lead Implant Date: 20160321
Implantable Lead Implant Date: 20160321
Implantable Lead Location: 753859
Implantable Lead Location: 753860
Implantable Pulse Generator Implant Date: 20160321
Lead Channel Impedance Value: 310 Ohm
Lead Channel Impedance Value: 310 Ohm
Lead Channel Pacing Threshold Amplitude: 0.75 V
Lead Channel Pacing Threshold Amplitude: 1.25 V
Lead Channel Pacing Threshold Pulse Width: 0.5 ms
Lead Channel Pacing Threshold Pulse Width: 0.8 ms
Lead Channel Sensing Intrinsic Amplitude: 12 mV
Lead Channel Sensing Intrinsic Amplitude: 3 mV
Lead Channel Setting Pacing Amplitude: 2.5 V
Lead Channel Setting Pacing Amplitude: 3 V
Lead Channel Setting Pacing Pulse Width: 0.5 ms
Lead Channel Setting Sensing Sensitivity: 8 mV
Pulse Gen Model: 2240
Pulse Gen Serial Number: 7734710

## 2022-08-04 ENCOUNTER — Encounter: Payer: Self-pay | Admitting: Family Medicine

## 2022-08-04 ENCOUNTER — Ambulatory Visit: Payer: Medicare Other | Admitting: Family Medicine

## 2022-08-04 ENCOUNTER — Ambulatory Visit (INDEPENDENT_AMBULATORY_CARE_PROVIDER_SITE_OTHER)
Admission: RE | Admit: 2022-08-04 | Discharge: 2022-08-04 | Disposition: A | Discharge status: Home / Self Care | Payer: Medicare Other | Source: Ambulatory Visit | Attending: Family Medicine | Admitting: Family Medicine

## 2022-08-04 VITALS — BP 130/50 | HR 69 | Temp 97.9°F | Ht 71.0 in | Wt 197.0 lb

## 2022-08-04 DIAGNOSIS — M545 Low back pain, unspecified: Secondary | ICD-10-CM | POA: Diagnosis not present

## 2022-08-04 DIAGNOSIS — E119 Type 2 diabetes mellitus without complications: Secondary | ICD-10-CM

## 2022-08-04 DIAGNOSIS — I480 Paroxysmal atrial fibrillation: Secondary | ICD-10-CM

## 2022-08-04 DIAGNOSIS — K921 Melena: Secondary | ICD-10-CM

## 2022-08-04 DIAGNOSIS — E1122 Type 2 diabetes mellitus with diabetic chronic kidney disease: Secondary | ICD-10-CM

## 2022-08-04 DIAGNOSIS — R0602 Shortness of breath: Secondary | ICD-10-CM

## 2022-08-04 LAB — CBC WITH DIFFERENTIAL/PLATELET
Basophils Absolute: 0.1 10*3/uL (ref 0.0–0.1)
Basophils Relative: 0.9 % (ref 0.0–3.0)
Eosinophils Absolute: 0.3 10*3/uL (ref 0.0–0.7)
Eosinophils Relative: 3.4 % (ref 0.0–5.0)
HCT: 28.4 % — ABNORMAL LOW (ref 39.0–52.0)
Hemoglobin: 9.3 g/dL — ABNORMAL LOW (ref 13.0–17.0)
Lymphocytes Relative: 23.7 % (ref 12.0–46.0)
Lymphs Abs: 2 10*3/uL (ref 0.7–4.0)
MCHC: 32.7 g/dL (ref 30.0–36.0)
MCV: 96.2 fl (ref 78.0–100.0)
Monocytes Absolute: 0.5 10*3/uL (ref 0.1–1.0)
Monocytes Relative: 6.1 % (ref 3.0–12.0)
Neutro Abs: 5.5 10*3/uL (ref 1.4–7.7)
Neutrophils Relative %: 65.9 % (ref 43.0–77.0)
Platelets: 112 10*3/uL — ABNORMAL LOW (ref 150.0–400.0)
RBC: 2.95 Mil/uL — ABNORMAL LOW (ref 4.22–5.81)
RDW: 15.5 % (ref 11.5–15.5)
WBC: 8.3 10*3/uL (ref 4.0–10.5)

## 2022-08-04 LAB — COMPREHENSIVE METABOLIC PANEL
ALT: 22 U/L (ref 0–53)
AST: 23 U/L (ref 0–37)
Albumin: 3.6 g/dL (ref 3.5–5.2)
Alkaline Phosphatase: 98 U/L (ref 39–117)
BUN: 52 mg/dL — ABNORMAL HIGH (ref 6–23)
CO2: 19 mEq/L (ref 19–32)
Calcium: 9.2 mg/dL (ref 8.4–10.5)
Chloride: 118 mEq/L — ABNORMAL HIGH (ref 96–112)
Creatinine, Ser: 2.69 mg/dL — ABNORMAL HIGH (ref 0.40–1.50)
GFR: 19.81 mL/min — ABNORMAL LOW (ref >60.00)
Glucose, Bld: 56 mg/dL — ABNORMAL LOW (ref 70–99)
Potassium: 5.5 mEq/L — ABNORMAL HIGH (ref 3.5–5.1)
Sodium: 144 mEq/L (ref 135–145)
Total Bilirubin: 0.4 mg/dL (ref 0.2–1.2)
Total Protein: 6 g/dL (ref 6.0–8.3)

## 2022-08-04 LAB — TSH: TSH: 3.69 u[IU]/mL (ref 0.35–5.50)

## 2022-08-04 LAB — FERRITIN: Ferritin: 20.6 ng/mL — ABNORMAL LOW (ref 22.0–322.0)

## 2022-08-04 LAB — IRON: Iron: 148 ug/dL (ref 42–165)

## 2022-08-04 LAB — HEMOGLOBIN A1C: Hgb A1c MFr Bld: 6.3 % (ref 4.6–6.5)

## 2022-08-04 LAB — BRAIN NATRIURETIC PEPTIDE: Pro B Natriuretic peptide (BNP): 446 pg/mL — ABNORMAL HIGH (ref 0.0–100.0)

## 2022-08-04 NOTE — Patient Instructions (Addendum)
Don't change your meds yet except for holding losartan for 2 days.   Go to the lab on the way out.   If you have mychart we'll likely use that to update you.    Take care.  Glad to see you. Take tylenol as needed for pain.

## 2022-08-04 NOTE — Progress Notes (Unsigned)
Since January after falling. Patient states he gets relief when sitting. Sometimes can get out of breath when walking, this has been ongoing for a few weeks.  Not SOB supine.  Fatigued.    He had black stools over the last few weeks, unclear if from iron.  Still on eliquis.    Minimally lightheaded on standing.   Lower mildline back pain is clearly better upon sitting.  Pain can radiate through the legs on standing, better on sitting.   His stomach feels bloated and he had some constipation.    H/o DM2.  Recheck A1c pending.   Meds, vitals, and allergies reviewed.   ROS: Per HPI unless specifically indicated in ROS section   L leg mildly puffy at baseline.     Don't change your meds yet except for holding losartan for 2 days.

## 2022-08-05 ENCOUNTER — Telehealth: Payer: Self-pay | Admitting: Family Medicine

## 2022-08-05 DIAGNOSIS — K921 Melena: Secondary | ICD-10-CM | POA: Insufficient documentation

## 2022-08-05 MED ORDER — APIXABAN 2.5 MG PO TABS: ORAL_TABLET | ORAL | Status: DC

## 2022-08-05 MED ORDER — LOSARTAN POTASSIUM 100 MG PO TABS: ORAL_TABLET | ORAL | Status: DC

## 2022-08-05 NOTE — Assessment & Plan Note (Signed)
Reasonable to recheck A1c in the meantime given that we are checking other labs.

## 2022-08-05 NOTE — Assessment & Plan Note (Signed)
He has relief upon sitting.  Check plain films today but discussed with patient about the possibility of spinal stenosis which may not be visible on x-ray.  At this point still okay for outpatient follow-up.

## 2022-08-05 NOTE — Assessment & Plan Note (Addendum)
Noted over the past few weeks with some fatigue and gradual onset shortness of breath on exertion.  See notes on labs.  Hold losartan for 2 doses in the meantime since he is mildly lightheaded.  At this point it appears that he is still okay for outpatient follow-up but we will check his labs and follow-up with the patient.  Routine cautions given to patient.  Possibility of slow GI bleed discussed with patient.  See notes on labs.

## 2022-08-05 NOTE — Telephone Encounter (Signed)
Patient returned call regarding labs,would like a cb. 

## 2022-08-06 NOTE — Telephone Encounter (Signed)
Labs have been given. See results note.

## 2022-08-07 ENCOUNTER — Inpatient Hospital Stay (HOSPITAL_COMMUNITY)
Admission: EM | Admit: 2022-08-07 | Discharge: 2022-08-13 | DRG: 982 | Disposition: A | Discharge status: Home / Self Care | Payer: Medicare Other | Source: Ambulatory Visit | Attending: Internal Medicine | Admitting: Internal Medicine

## 2022-08-07 ENCOUNTER — Other Ambulatory Visit: Payer: Self-pay

## 2022-08-07 ENCOUNTER — Encounter: Payer: Self-pay | Admitting: Family Medicine

## 2022-08-07 ENCOUNTER — Ambulatory Visit: Payer: Medicare Other | Admitting: Family Medicine

## 2022-08-07 ENCOUNTER — Encounter (HOSPITAL_COMMUNITY): Payer: Self-pay

## 2022-08-07 VITALS — BP 130/52 | HR 86 | Temp 97.0°F | Ht 71.0 in | Wt 198.0 lb

## 2022-08-07 DIAGNOSIS — I2510 Atherosclerotic heart disease of native coronary artery without angina pectoris: Secondary | ICD-10-CM | POA: Diagnosis present

## 2022-08-07 DIAGNOSIS — K5521 Angiodysplasia of colon with hemorrhage: Secondary | ICD-10-CM | POA: Diagnosis not present

## 2022-08-07 DIAGNOSIS — M4856XA Collapsed vertebra, not elsewhere classified, lumbar region, initial encounter for fracture: Secondary | ICD-10-CM | POA: Diagnosis present

## 2022-08-07 DIAGNOSIS — T39395A Adverse effect of other nonsteroidal anti-inflammatory drugs [NSAID], initial encounter: Secondary | ICD-10-CM | POA: Diagnosis present

## 2022-08-07 DIAGNOSIS — D649 Anemia, unspecified: Secondary | ICD-10-CM | POA: Diagnosis not present

## 2022-08-07 DIAGNOSIS — Z79899 Other long term (current) drug therapy: Secondary | ICD-10-CM

## 2022-08-07 DIAGNOSIS — E875 Hyperkalemia: Secondary | ICD-10-CM | POA: Diagnosis present

## 2022-08-07 DIAGNOSIS — E8722 Chronic metabolic acidosis: Secondary | ICD-10-CM | POA: Diagnosis present

## 2022-08-07 DIAGNOSIS — Z96653 Presence of artificial knee joint, bilateral: Secondary | ICD-10-CM | POA: Diagnosis present

## 2022-08-07 DIAGNOSIS — E1151 Type 2 diabetes mellitus with diabetic peripheral angiopathy without gangrene: Secondary | ICD-10-CM | POA: Diagnosis present

## 2022-08-07 DIAGNOSIS — K449 Diaphragmatic hernia without obstruction or gangrene: Secondary | ICD-10-CM | POA: Diagnosis present

## 2022-08-07 DIAGNOSIS — K279 Peptic ulcer, site unspecified, unspecified as acute or chronic, without hemorrhage or perforation: Secondary | ICD-10-CM | POA: Diagnosis present

## 2022-08-07 DIAGNOSIS — N183 Chronic kidney disease, stage 3 unspecified: Secondary | ICD-10-CM | POA: Diagnosis not present

## 2022-08-07 DIAGNOSIS — K922 Gastrointestinal hemorrhage, unspecified: Secondary | ICD-10-CM | POA: Diagnosis not present

## 2022-08-07 DIAGNOSIS — Z86010 Personal history of colonic polyps: Secondary | ICD-10-CM

## 2022-08-07 DIAGNOSIS — D509 Iron deficiency anemia, unspecified: Secondary | ICD-10-CM | POA: Diagnosis present

## 2022-08-07 DIAGNOSIS — Z950 Presence of cardiac pacemaker: Secondary | ICD-10-CM

## 2022-08-07 DIAGNOSIS — I480 Paroxysmal atrial fibrillation: Secondary | ICD-10-CM | POA: Diagnosis present

## 2022-08-07 DIAGNOSIS — K219 Gastro-esophageal reflux disease without esophagitis: Secondary | ICD-10-CM | POA: Diagnosis present

## 2022-08-07 DIAGNOSIS — K5520 Angiodysplasia of colon without hemorrhage: Secondary | ICD-10-CM

## 2022-08-07 DIAGNOSIS — K5731 Diverticulosis of large intestine without perforation or abscess with bleeding: Secondary | ICD-10-CM | POA: Diagnosis present

## 2022-08-07 DIAGNOSIS — Z7901 Long term (current) use of anticoagulants: Secondary | ICD-10-CM | POA: Diagnosis not present

## 2022-08-07 DIAGNOSIS — R195 Other fecal abnormalities: Secondary | ICD-10-CM

## 2022-08-07 DIAGNOSIS — K921 Melena: Secondary | ICD-10-CM

## 2022-08-07 DIAGNOSIS — E876 Hypokalemia: Secondary | ICD-10-CM | POA: Diagnosis not present

## 2022-08-07 DIAGNOSIS — I5032 Chronic diastolic (congestive) heart failure: Secondary | ICD-10-CM | POA: Diagnosis not present

## 2022-08-07 DIAGNOSIS — Z9181 History of falling: Secondary | ICD-10-CM

## 2022-08-07 DIAGNOSIS — K317 Polyp of stomach and duodenum: Secondary | ICD-10-CM | POA: Diagnosis present

## 2022-08-07 DIAGNOSIS — K644 Residual hemorrhoidal skin tags: Secondary | ICD-10-CM | POA: Diagnosis present

## 2022-08-07 DIAGNOSIS — D125 Benign neoplasm of sigmoid colon: Secondary | ICD-10-CM

## 2022-08-07 DIAGNOSIS — E785 Hyperlipidemia, unspecified: Secondary | ICD-10-CM | POA: Diagnosis present

## 2022-08-07 DIAGNOSIS — N184 Chronic kidney disease, stage 4 (severe): Secondary | ICD-10-CM | POA: Diagnosis present

## 2022-08-07 DIAGNOSIS — Z87891 Personal history of nicotine dependence: Secondary | ICD-10-CM

## 2022-08-07 DIAGNOSIS — D500 Iron deficiency anemia secondary to blood loss (chronic): Secondary | ICD-10-CM

## 2022-08-07 DIAGNOSIS — E1129 Type 2 diabetes mellitus with other diabetic kidney complication: Secondary | ICD-10-CM | POA: Diagnosis present

## 2022-08-07 DIAGNOSIS — D122 Benign neoplasm of ascending colon: Secondary | ICD-10-CM

## 2022-08-07 DIAGNOSIS — E1122 Type 2 diabetes mellitus with diabetic chronic kidney disease: Secondary | ICD-10-CM | POA: Diagnosis present

## 2022-08-07 DIAGNOSIS — Z951 Presence of aortocoronary bypass graft: Secondary | ICD-10-CM

## 2022-08-07 DIAGNOSIS — Z8673 Personal history of transient ischemic attack (TIA), and cerebral infarction without residual deficits: Secondary | ICD-10-CM

## 2022-08-07 DIAGNOSIS — I442 Atrioventricular block, complete: Secondary | ICD-10-CM | POA: Diagnosis present

## 2022-08-07 DIAGNOSIS — K648 Other hemorrhoids: Secondary | ICD-10-CM | POA: Diagnosis present

## 2022-08-07 DIAGNOSIS — Z7984 Long term (current) use of oral hypoglycemic drugs: Secondary | ICD-10-CM

## 2022-08-07 DIAGNOSIS — G8929 Other chronic pain: Secondary | ICD-10-CM | POA: Diagnosis present

## 2022-08-07 DIAGNOSIS — I10 Essential (primary) hypertension: Secondary | ICD-10-CM | POA: Diagnosis present

## 2022-08-07 DIAGNOSIS — M810 Age-related osteoporosis without current pathological fracture: Secondary | ICD-10-CM | POA: Diagnosis present

## 2022-08-07 DIAGNOSIS — I130 Hypertensive heart and chronic kidney disease with heart failure and stage 1 through stage 4 chronic kidney disease, or unspecified chronic kidney disease: Secondary | ICD-10-CM | POA: Diagnosis present

## 2022-08-07 DIAGNOSIS — D696 Thrombocytopenia, unspecified: Secondary | ICD-10-CM | POA: Diagnosis present

## 2022-08-07 DIAGNOSIS — Z8249 Family history of ischemic heart disease and other diseases of the circulatory system: Secondary | ICD-10-CM

## 2022-08-07 DIAGNOSIS — D62 Acute posthemorrhagic anemia: Secondary | ICD-10-CM | POA: Diagnosis present

## 2022-08-07 DIAGNOSIS — Z833 Family history of diabetes mellitus: Secondary | ICD-10-CM

## 2022-08-07 DIAGNOSIS — I739 Peripheral vascular disease, unspecified: Secondary | ICD-10-CM | POA: Diagnosis present

## 2022-08-07 DIAGNOSIS — I252 Old myocardial infarction: Secondary | ICD-10-CM

## 2022-08-07 LAB — CBC
HCT: 23.9 % — ABNORMAL LOW (ref 39.0–52.0)
Hemoglobin: 7.4 g/dL — ABNORMAL LOW (ref 13.0–17.0)
MCH: 31.1 pg (ref 26.0–34.0)
MCHC: 31 g/dL (ref 30.0–36.0)
MCV: 100.4 fL — ABNORMAL HIGH (ref 80.0–100.0)
Platelets: 72 10*3/uL — ABNORMAL LOW (ref 150–400)
RBC: 2.38 MIL/uL — ABNORMAL LOW (ref 4.22–5.81)
RDW: 15.3 % (ref 11.5–15.5)
WBC: 3.9 10*3/uL — ABNORMAL LOW (ref 4.0–10.5)
nRBC: 0 % (ref 0.0–0.2)

## 2022-08-07 LAB — COMPREHENSIVE METABOLIC PANEL
ALT: 27 U/L (ref 0–44)
AST: 27 U/L (ref 15–41)
Albumin: 3.4 g/dL — ABNORMAL LOW (ref 3.5–5.0)
Alkaline Phosphatase: 96 U/L (ref 38–126)
Anion gap: 8 (ref 5–15)
BUN: 47 mg/dL — ABNORMAL HIGH (ref 8–23)
CO2: 16 mmol/L — ABNORMAL LOW (ref 22–32)
Calcium: 8.8 mg/dL — ABNORMAL LOW (ref 8.9–10.3)
Chloride: 117 mmol/L — ABNORMAL HIGH (ref 98–111)
Creatinine, Ser: 2.78 mg/dL — ABNORMAL HIGH (ref 0.61–1.24)
GFR, Estimated: 21 mL/min — ABNORMAL LOW (ref >60)
Glucose, Bld: 204 mg/dL — ABNORMAL HIGH (ref 70–99)
Potassium: 6.1 mmol/L — ABNORMAL HIGH (ref 3.5–5.1)
Sodium: 141 mmol/L (ref 135–145)
Total Bilirubin: 0.8 mg/dL (ref 0.3–1.2)
Total Protein: 5.9 g/dL — ABNORMAL LOW (ref 6.5–8.1)

## 2022-08-07 LAB — CBC WITH DIFFERENTIAL/PLATELET
Abs Immature Granulocytes: 0.02 10*3/uL (ref 0.00–0.07)
Basophils Absolute: 0.1 10*3/uL (ref 0.0–0.1)
Basophils Relative: 1 %
Eosinophils Absolute: 0.1 10*3/uL (ref 0.0–0.5)
Eosinophils Relative: 2 %
HCT: 27.6 % — ABNORMAL LOW (ref 39.0–52.0)
Hemoglobin: 8.7 g/dL — ABNORMAL LOW (ref 13.0–17.0)
Immature Granulocytes: 0 %
Lymphocytes Relative: 21 %
Lymphs Abs: 1.5 10*3/uL (ref 0.7–4.0)
MCH: 32.2 pg (ref 26.0–34.0)
MCHC: 31.5 g/dL (ref 30.0–36.0)
MCV: 102.2 fL — ABNORMAL HIGH (ref 80.0–100.0)
Monocytes Absolute: 0.5 10*3/uL (ref 0.1–1.0)
Monocytes Relative: 6 %
Neutro Abs: 5.1 10*3/uL (ref 1.7–7.7)
Neutrophils Relative %: 70 %
Platelets: 88 10*3/uL — ABNORMAL LOW (ref 150–400)
RBC: 2.7 MIL/uL — ABNORMAL LOW (ref 4.22–5.81)
RDW: 15.1 % (ref 11.5–15.5)
WBC: 7.2 10*3/uL (ref 4.0–10.5)
nRBC: 0 % (ref 0.0–0.2)

## 2022-08-07 LAB — TYPE AND SCREEN
ABO/RH(D): AB POS
Antibody Screen: NEGATIVE

## 2022-08-07 LAB — BASIC METABOLIC PANEL
Anion gap: 7 (ref 5–15)
BUN: 44 mg/dL — ABNORMAL HIGH (ref 8–23)
CO2: 18 mmol/L — ABNORMAL LOW (ref 22–32)
Calcium: 9.1 mg/dL (ref 8.9–10.3)
Chloride: 117 mmol/L — ABNORMAL HIGH (ref 98–111)
Creatinine, Ser: 2.63 mg/dL — ABNORMAL HIGH (ref 0.61–1.24)
GFR, Estimated: 22 mL/min — ABNORMAL LOW (ref >60)
Glucose, Bld: 200 mg/dL — ABNORMAL HIGH (ref 70–99)
Potassium: 6.4 mmol/L (ref 3.5–5.1)
Sodium: 142 mmol/L (ref 135–145)

## 2022-08-07 LAB — POC OCCULT BLOOD, ED: Fecal Occult Bld: POSITIVE — AB

## 2022-08-07 LAB — GLUCOSE, CAPILLARY
Glucose-Capillary: 96 mg/dL (ref 70–99)
Glucose-Capillary: 134 mg/dL — ABNORMAL HIGH (ref 70–99)
Glucose-Capillary: 201 mg/dL — ABNORMAL HIGH (ref 70–99)

## 2022-08-07 LAB — CBG MONITORING, ED: Glucose-Capillary: 121 mg/dL — ABNORMAL HIGH (ref 70–99)

## 2022-08-07 MED ORDER — INSULIN ASPART 100 UNIT/ML IV SOLN
10.0000 [IU] | Freq: Once | INTRAVENOUS | Status: AC
  Administered 2022-08-07: 10 [IU] via INTRAVENOUS

## 2022-08-07 MED ORDER — INSULIN ASPART 100 UNIT/ML IV SOLN
5.0000 [IU] | Freq: Once | INTRAVENOUS | Status: AC
  Administered 2022-08-07: 5 [IU] via INTRAVENOUS

## 2022-08-07 MED ORDER — PANTOPRAZOLE SODIUM 40 MG IV SOLR
40.0000 mg | Freq: Once | INTRAVENOUS | Status: AC
  Administered 2022-08-07: 40 mg via INTRAVENOUS
  Filled 2022-08-07: qty 10

## 2022-08-07 MED ORDER — SODIUM CHLORIDE 0.9% FLUSH
3.0000 mL | Freq: Two times a day (BID) | INTRAVENOUS | Status: DC
  Administered 2022-08-07 – 2022-08-13 (×10): 3 mL via INTRAVENOUS

## 2022-08-07 MED ORDER — POLYETHYLENE GLYCOL 3350 17 G PO PACK: 17.0000 g | PACK | Freq: Every day | ORAL | Status: DC | PRN

## 2022-08-07 MED ORDER — METOPROLOL SUCCINATE ER 25 MG PO TB24
25.0000 mg | ORAL_TABLET | Freq: Every day | ORAL | Status: DC
  Administered 2022-08-08 – 2022-08-12 (×5): 25 mg via ORAL
  Filled 2022-08-07 (×6): qty 1

## 2022-08-07 MED ORDER — ROSUVASTATIN CALCIUM 20 MG PO TABS
20.0000 mg | ORAL_TABLET | Freq: Every day | ORAL | Status: DC
  Administered 2022-08-08 – 2022-08-13 (×6): 20 mg via ORAL
  Filled 2022-08-07 (×6): qty 1

## 2022-08-07 MED ORDER — INSULIN GLARGINE-YFGN 100 UNIT/ML ~~LOC~~ SOLN
15.0000 [IU] | Freq: Every day | SUBCUTANEOUS | Status: DC
  Filled 2022-08-07: qty 0.15

## 2022-08-07 MED ORDER — DEXTROSE 50 % IV SOLN
1.0000 | Freq: Once | INTRAVENOUS | Status: AC
  Administered 2022-08-07: 50 mL via INTRAVENOUS
  Filled 2022-08-07: qty 50

## 2022-08-07 MED ORDER — PANTOPRAZOLE SODIUM 40 MG IV SOLR: 40.0000 mg | INTRAVENOUS | Status: DC

## 2022-08-07 MED ORDER — ACETAMINOPHEN 650 MG RE SUPP: 650.0000 mg | Freq: Four times a day (QID) | RECTAL | Status: DC | PRN

## 2022-08-07 MED ORDER — PATIROMER SORBITEX CALCIUM 8.4 G PO PACK
25.2000 g | PACK | Freq: Once | ORAL | Status: AC
  Administered 2022-08-07: 25.2 g via ORAL
  Filled 2022-08-07: qty 3

## 2022-08-07 MED ORDER — ACETAMINOPHEN 325 MG PO TABS
650.0000 mg | ORAL_TABLET | Freq: Four times a day (QID) | ORAL | Status: DC | PRN
  Filled 2022-08-07: qty 2

## 2022-08-07 MED ORDER — PANTOPRAZOLE SODIUM 40 MG IV SOLR
40.0000 mg | Freq: Two times a day (BID) | INTRAVENOUS | Status: DC
  Administered 2022-08-07 – 2022-08-10 (×7): 40 mg via INTRAVENOUS
  Filled 2022-08-07 (×7): qty 10

## 2022-08-07 MED ORDER — INSULIN ASPART 100 UNIT/ML IJ SOLN
0.0000 [IU] | Freq: Three times a day (TID) | INTRAMUSCULAR | Status: DC
  Administered 2022-08-08 (×2): 1 [IU] via SUBCUTANEOUS
  Administered 2022-08-10 – 2022-08-12 (×4): 2 [IU] via SUBCUTANEOUS
  Administered 2022-08-12: 1 [IU] via SUBCUTANEOUS

## 2022-08-07 NOTE — ED Triage Notes (Signed)
Patient bib [facility] from doctors office. He was seen last week for a GI bleed and told he had a hgb of 9 they took him off his Eliquis. He went back in today for a follow up when he got there he was SOB and having chest pain. They did place him on oxygen for the sob. VSS.

## 2022-08-07 NOTE — ED Notes (Signed)
ED TO INPATIENT HANDOFF REPORT  ED Nurse Name and Phone #:  Mycah Formica   S Name/Age/Gender Dean Murphy 89 y.o. male Room/Bed: TRAAC/TRAAC  Code Status   Code Status: Full Code  Home/SNF/Other Home Patient oriented to: self, place, time, and situation Is this baseline? Yes   Triage Complete: Triage complete  Chief Complaint GI bleed [K92.2]  Triage Note Patient bib [facility] from doctors office. He was seen last week for a GI bleed and told he had a hgb of 9 they took him off his Eliquis. He went back in today for a follow up when he got there he was SOB and having chest pain. They did place him on oxygen for the sob. VSS.     Allergies Allergies  Allergen Reactions   Zoledronic Acid Other (See Comments)    Stopped 2015 due to Creatine  Other reaction(s): Unknown Stopped 2015 due to Cr   Lokelma [Sodium Zirconium Cyclosilicate]     Nausea/vomiting   Nsaids Other (See Comments)    Held due to creatinine elevation.      Level of Care/Admitting Diagnosis ED Disposition     ED Disposition  Admit   Condition  --   Comment  Hospital Area: [place] [[id number]]  Level of Care: Telemetry Medical [104]  May place patient in observation at [facility] or [place] if equivalent level of care is available:: No  Covid Evaluation: Asymptomatic - no recent exposure (last 10 days) testing not required  Diagnosis: GI bleed [id number]  Admitting Physician: Marcelyn Bruins [id number]  Attending Physician: Marcelyn Bruins [id number]          B Medical/Surgery History Past Medical History:  Diagnosis Date   Anemia    Arthritis    CAD (coronary artery disease)    a. CABG 1989, b. Myoview low risk 2012.   Carotid artery occlusion    a. Duplex 10/2014: patent R/L CEA with mild hyperplasia in right surgical bulb - followed by vascular.   CHB (complete heart block) [facility]) March 2016   a. s/p STJ dual chamber pacemaker 07/2014.   Chronic diastolic CHF  (congestive heart failure) ([place])    a. Dx [id number] - acute diastolic CHF in the setting of CHB.   Chronic kidney disease, stage IV (severe) (HCC)    stage III/IV as of 2015, [name]   Colon polyps    Complication of anesthesia    Constipation    CVA (cerebral infarction)    Diabetes mellitus    type II   Diverticulosis    Dupuytren's disease    finger right hand contracted   Essential hypertension    GERD (gastroesophageal reflux disease)    Hemorrhoids    Hyperlipidemia    Myocardial infarction ([place]) 1976  and Mar. 19, 2016   Orthostasis    Osteoporosis    PAF (paroxysmal atrial fibrillation) ([place]) March 2016   PONV (postoperative nausea and vomiting)    Presence of permanent cardiac pacemaker    Shortness of breath dyspnea    due to pain   Past Surgical History:  Procedure Laterality Date   CARDIAC CATHETERIZATION  08/11/2014   Procedure: TEMPORARY PACEMAKER;  Surgeon: [name], MD;  Location: [facility] CATH LAB;  Service: Cardiovascular;;   CAROTID ENDARTERECTOMY  12/31/10   Right   CAROTID ENDARTERECTOMY  02/09/11   left   CHOLECYSTECTOMY     CORONARY ARTERY BYPASS GRAFT  1989   ENDOSCOPIC RETROGRADE CHOLANGIOPANCREATOGRAPHY (ERCP)  WITH PROPOFOL N/A 01/27/2022   Procedure: ENDOSCOPIC RETROGRADE CHOLANGIOPANCREATOGRAPHY (ERCP) WITH PROPOFOL;  Surgeon: Ladene Artist, MD;  Location: [facility] ENDOSCOPY;  Service: Gastroenterology;  Laterality: N/A;   EYE SURGERY Bilateral    Cataract with implants   I & D KNEE WITH POLY EXCHANGE Left 07/29/2015   Procedure: IRRIGATION AND DEBRIDEMENT LEFT KNEE WITH POLY EXCHANGE;  Surgeon: Rod Can, MD;  Location: [facility];  Service: Orthopedics;  Laterality: Left;   IR GENERIC HISTORICAL  03/04/2016   IR US GUIDE VASC ACCESS RIGHT 03/04/2016 Marybelle Killings, MD [facility]INTER[facility] RAD   IR GENERIC HISTORICAL  03/04/2016   IR FLUORO GUIDE CV LINE RIGHT 03/04/2016 Marybelle Killings, MD [facility]   JOINT REPLACEMENT     bilat. knees   PERMANENT  PACEMAKER INSERTION N/A 08/13/2014   STJ dual chamber pacemaker implanted by [name] for [place] OF STONES  01/27/2022   Procedure: REMOVAL OF STONES;  Surgeon: Ladene Artist, MD;  Location: [facility] ENDOSCOPY;  Service: Gastroenterology;;   Joan Mayans  01/27/2022   Procedure: SPHINCTEROTOMY;  Surgeon: Ladene Artist, MD;  Location: [facility] ENDOSCOPY;  Service: Gastroenterology;;   TONSILLECTOMY     TOTAL KNEE ARTHROPLASTY     bilateral     A IV Location/Drains/Wounds Patient Lines/Drains/Airways Status     Active Line/Drains/Airways     Name Placement date Placement time Site Days   Peripheral IV 08/07/22 20 G Anterior;Distal;Right;Upper Arm 08/07/22  1343  Arm  less than 1            Intake/Output Last 24 hours No intake or output data in the 24 hours ending 08/07/22 1610  Labs/Imaging Results for orders placed or performed during the hospital encounter of 08/07/22 (from the past 48 hour(s))  Comprehensive metabolic panel     Status: Abnormal   Collection Time: 08/07/22 12:38 PM  Result Value Ref Range   Sodium 141 135 - 145 mmol/L   Potassium 6.1 (H) 3.5 - 5.1 mmol/L   Chloride 117 (H) 98 - 111 mmol/L   CO2 16 (L) 22 - 32 mmol/L   Glucose, Bld 204 (H) 70 - 99 mg/dL    Comment: Glucose reference range applies only to samples taken after fasting for at least 8 hours.   BUN 47 (H) 8 - 23 mg/dL   Creatinine, Ser 2.78 (H) 0.61 - 1.24 mg/dL   Calcium 8.8 (L) 8.9 - 10.3 mg/dL   Total Protein 5.9 (L) 6.5 - 8.1 g/dL   Albumin 3.4 (L) 3.5 - 5.0 g/dL   AST 27 15 - 41 U/L   ALT 27 0 - 44 U/L   Alkaline Phosphatase 96 38 - 126 U/L   Total Bilirubin 0.8 0.3 - 1.2 mg/dL   GFR, Estimated 21 (L) >60 mL/min    Comment: (NOTE) Calculated using the CKD-EPI Creatinine Equation (2021)    Anion gap 8 5 - 15    Comment: Performed at [facility], [place] [address] [place], [place] [address]  CBC with Differential     Status: Abnormal   Collection Time: 08/07/22 12:38 PM   Result Value Ref Range   WBC 7.2 4.0 - 10.5 K/uL   RBC 2.70 (L) 4.22 - 5.81 MIL/uL   Hemoglobin 8.7 (L) 13.0 - 17.0 g/dL   HCT 27.6 (L) 39.0 - 52.0 %   MCV 102.2 (H) 80.0 - 100.0 fL   MCH 32.2 26.0 - 34.0 pg   MCHC 31.5 30.0 - 36.0 g/dL  RDW 15.1 11.5 - 15.5 %   Platelets 88 (L) 150 - 400 K/uL    Comment: Immature Platelet Fraction may be clinically indicated, consider ordering this additional test [id number] REPEATED TO VERIFY    nRBC 0.0 0.0 - 0.2 %   Neutrophils Relative % 70 %   Neutro Abs 5.1 1.7 - 7.7 K/uL   Lymphocytes Relative 21 %   Lymphs Abs 1.5 0.7 - 4.0 K/uL   Monocytes Relative 6 %   Monocytes Absolute 0.5 0.1 - 1.0 K/uL   Eosinophils Relative 2 %   Eosinophils Absolute 0.1 0.0 - 0.5 K/uL   Basophils Relative 1 %   Basophils Absolute 0.1 0.0 - 0.1 K/uL   Immature Granulocytes 0 %   Abs Immature Granulocytes 0.02 0.00 - 0.07 K/uL    Comment: Performed at [facility] [address], [place], [place] [address]  Type and screen [place]     Status: None   Collection Time: 08/07/22 12:38 PM  Result Value Ref Range   ABO/RH(D) AB POS    Antibody Screen NEG    Sample Expiration      08/10/2022,2359 Performed at [facility], [place] [address], [place], [place] [address]   POC occult blood, ED     Status: Abnormal   Collection Time: 08/07/22  2:52 PM  Result Value Ref Range   Fecal Occult Bld POSITIVE (A) NEGATIVE  CBG monitoring, ED     Status: Abnormal   Collection Time: 08/07/22  3:36 PM  Result Value Ref Range   Glucose-Capillary 121 (H) 70 - 99 mg/dL    Comment: Glucose reference range applies only to samples taken after fasting for at least 8 hours.   Comment 1 Document in Chart    No results found.  Pending Labs Unresulted Labs (From admission, onward)     Start     Ordered   08/08/22 0500  Comprehensive metabolic panel  Tomorrow morning,   R        08/07/22 1606   08/08/22 0500  CBC  Tomorrow morning,   R         08/07/22 1606   08/07/22 2000  CBC  Once,   STAT        08/07/22 1602   08/07/22 [id number]  Basic metabolic panel  Once,   STAT        08/07/22 1602            Vitals/Pain Today's Vitals   08/07/22 1400 08/07/22 1500 08/07/22 1545 08/07/22 1600  BP: (!) 127/52 106/88 (!) 122/42 (!) 120/97  Pulse: (!) 56 62 (!) 53 65  Resp: 12 19 14 15   Temp:      TempSrc:      SpO2: 100% 100% 98% 97%  PainSc:        Isolation Precautions No active isolations  Medications Medications  patiromer (VELTASSA) packet 25.2 g (has no administration in time range)  metoprolol succinate (TOPROL-XL) 24 hr tablet 25 mg (has no administration in time range)  rosuvastatin (CRESTOR) tablet 20 mg (has no administration in time range)  sodium chloride flush (NS) 0.9 % injection 3 mL (has no administration in time range)  acetaminophen (TYLENOL) tablet 650 mg (has no administration in time range)    Or  acetaminophen (TYLENOL) suppository 650 mg (has no administration in time range)  polyethylene glycol (MIRALAX / GLYCOLAX) packet 17 g (has no administration in time range)  pantoprazole (PROTONIX) injection  40 mg (has no administration in time range)  pantoprazole (PROTONIX) injection 40 mg (40 mg Intravenous Given 08/07/22 1343)  insulin aspart (novoLOG) injection 5 Units (5 Units Intravenous Given 08/07/22 1534)    And  dextrose 50 % solution 50 mL (50 mLs Intravenous Given 08/07/22 1536)    Mobility walks     R Recommendations: See Admitting Provider Note  Report given to:   Additional Notes: hgb last week 9, K here 6.1, given IV insulin, dextrose, and veltassa for hyperK, A&Ox4, history GI bleed, hemoccult +

## 2022-08-07 NOTE — ED Notes (Signed)
Called lab for processing.

## 2022-08-07 NOTE — Assessment & Plan Note (Signed)
With recent anemia noted, with continued black stools in spite of stopping anticoagulation.  Concern for ongoing loss with potential need for transfusion.  D/w pt about ER eval, 911 called, placed on O2 and awaiting EMS transport.  CP free and not SOB at this point.

## 2022-08-07 NOTE — Consult Note (Signed)
Referring Provider: Dr. Trilby Drummer Primary Care Physician:  Tonia Ghent, MD Primary Gastroenterologist:  Dr. Fuller Plan  Reason for Consultation:  Black stool, anemia  HPI: Dean Murphy is a 89 y.o. male with medical history significant of stroke on Eliquis, CAD status post CABG, PVD status post bilateral CEA, A-fib, complete heart block status post pacemaker, GERD, GI bleed, hypertension, hyperlipidemia, diabetes, CKD 3, diastolic CHF presenting with weakness and black stools.   Patient sent from PCP. Has had 3 to 4 weeks of black stools and saw his PCP 3 days ago noted to have hemoglobin drop from 12. 4 grams three months ago to 9.3 grams. Total stop anticoagulations so last dose of Eliquis was 3/13 evening.  Had also been using some ibuprofen for his back pain so was told to discontinue that as well.  He has continued to have dark stools, 5 this AM and spoke with his PCP again who recommended he proceed to the ED.   Here hemoglobin is 8.7 g.  He is Hemoccult positive.  Denies any red blood in his stool.  No nausea or vomiting.  No abdominal pain.  Complains of a lot of back pain from some fractures after having a fall back in October.  Has been placed on pantoprazole IV.  Colonoscopy 09/2014:  1. Six sessile polyps in the descending, sigmoid, ascending and transverse colon; polypectomies performed with a cold snare 2. Sessile polyp in the ascending colon; polypectomy performed using snare cautery 3. Moderate diverticulosis noted in the sigmoid colon and descending colon 4. External hemorrhoids   Tubular adenomas.  EGD 09/2014:  Normal.   Past Medical History:  Diagnosis Date   Anemia    Arthritis    CAD (coronary artery disease)    a. CABG 1989, b. Myoview low risk 2012.   Carotid artery occlusion    a. Duplex 10/2014: patent R/L CEA with mild hyperplasia in right surgical bulb - followed by vascular.   CHB (complete heart block) [facility]) March 2016   a. s/p STJ dual chamber pacemaker  07/2014.   Chronic diastolic CHF (congestive heart failure) ([place])    a. Dx [id number] - acute diastolic CHF in the setting of CHB.   Chronic kidney disease, stage IV (severe) (HCC)    stage III/IV as of 2015, [name]   Colon polyps    Complication of anesthesia    Constipation    CVA (cerebral infarction)    Diabetes mellitus    type II   Diverticulosis    Dupuytren's disease    finger right hand contracted   Essential hypertension    GERD (gastroesophageal reflux disease)    Hemorrhoids    Hyperlipidemia    Myocardial infarction ([place]) 1976  and Mar. 19, 2016   Orthostasis    Osteoporosis    PAF (paroxysmal atrial fibrillation) ([place]) March 2016   PONV (postoperative nausea and vomiting)    Presence of permanent cardiac pacemaker    Shortness of breath dyspnea    due to pain    Past Surgical History:  Procedure Laterality Date   CARDIAC CATHETERIZATION  08/11/2014   Procedure: TEMPORARY PACEMAKER;  Surgeon: [name], MD;  Location: [facility] CATH LAB;  Service: Cardiovascular;;   CAROTID ENDARTERECTOMY  12/31/10   Right   CAROTID ENDARTERECTOMY  02/09/11   left   CHOLECYSTECTOMY     CORONARY ARTERY BYPASS GRAFT  1989   ENDOSCOPIC RETROGRADE CHOLANGIOPANCREATOGRAPHY (ERCP) WITH PROPOFOL N/A 01/27/2022   Procedure: ENDOSCOPIC  RETROGRADE CHOLANGIOPANCREATOGRAPHY (ERCP) WITH PROPOFOL;  Surgeon: Ladene Artist, MD;  Location: [facility];  Service: Gastroenterology;  Laterality: N/A;   EYE SURGERY Bilateral    Cataract with implants   I & D KNEE WITH POLY EXCHANGE Left 07/29/2015   Procedure: IRRIGATION AND DEBRIDEMENT LEFT KNEE WITH POLY EXCHANGE;  Surgeon: Rod Can, MD;  Location: [facility];  Service: Orthopedics;  Laterality: Left;   IR GENERIC HISTORICAL  03/04/2016   IR US GUIDE VASC ACCESS RIGHT 03/04/2016 Marybelle Killings, MD [facility]   IR GENERIC HISTORICAL  03/04/2016   IR FLUORO GUIDE CV LINE RIGHT 03/04/2016 Marybelle Killings, MD [facility]   JOINT REPLACEMENT      bilat. knees   PERMANENT PACEMAKER INSERTION N/A 08/13/2014   STJ dual chamber pacemaker implanted by [name] for [place] OF STONES  01/27/2022   Procedure: REMOVAL OF STONES;  Surgeon: Ladene Artist, MD;  Location: [facility];  Service: Gastroenterology;;   Joan Mayans  01/27/2022   Procedure: Joan Mayans;  Surgeon: Ladene Artist, MD;  Location: [facility];  Service: Gastroenterology;;   TONSILLECTOMY     TOTAL KNEE ARTHROPLASTY     bilateral    Prior to Admission medications   Medication Sig Start Date End Date Taking? Authorizing Provider  amoxicillin (AMOXIL) 500 MG capsule Take 1 capsule (500 mg total) by mouth daily. 07/22/22   Michel Bickers, MD  apixaban Arne [place]) 2.5 MG TABS tablet Held as of 08/05/22 08/05/22   Tonia Ghent, MD  B-D UF III MINI PEN NEEDLES 31G X 5 MM MISC USE DAILY WITH INSULIN PEN 08/18/21   Tonia Ghent, MD  cholecalciferol (VITAMIN D3) 25 MCG (1000 UNIT) tablet Take 1,000 Units by mouth daily.    [provider]  clotrimazole-betamethasone (LOTRISONE) cream APPLY TO AFFECTED AREA TWICE A DAY 07/09/21   Tonia Ghent, MD  dorzolamide-timolol (COSOPT) 22.3-6.8 MG/ML ophthalmic solution 1 drop daily in the afternoon. In both eye 01/16/22   [provider]  ferrous sulfate 325 (65 FE) MG tablet TAKE 1 TABLET BY MOUTH ON FRIDAY 12/17/20   Tonia Ghent, MD  glipiZIDE (GLUCOTROL) 5 MG tablet TAKE 2 TABLETS BY MOUTH EVERY MORNING BEFORE BREAKFAST AND 1 TABLET BEFORE SUPPER 09/22/21   Tonia Ghent, MD  insulin glargine (LANTUS SOLOSTAR) 100 UNIT/ML Solostar Pen INJECT 34 UNITS INTO THE SKIN DAILY 06/18/22   Tonia Ghent, MD  JANUVIA 50 MG tablet TAKE 1 TABLET BY MOUTH EVERY DAY 03/16/22   Tonia Ghent, MD  losartan (COZAAR) 100 MG tablet Held as of 08/05/22 08/05/22   Tonia Ghent, MD  lubiprostone (AMITIZA) 8 MCG capsule TAKE ONE CAPSULE BY MOUTH EACH MORNING 01/27/22   Tonia Ghent, MD  metoprolol  succinate (TOPROL XL) 25 MG 24 hr tablet Take 1 tablet (25 mg total) by mouth at bedtime. 06/18/22   Freada Bergeron, MD  omeprazole (PRILOSEC) 40 MG capsule TAKE 1 CAPSULE BY MOUTH EVERY DAY 06/18/22   Tonia Ghent, MD  polyethylene glycol powder (GLYCOLAX/MIRALAX) 17 GM/SCOOP powder Take 17 g by mouth daily as needed. 04/20/22   Tonia Ghent, MD  rosuvastatin (CRESTOR) 20 MG tablet Take 1 tablet (20 mg total) by mouth daily. 08/18/21   Freada Bergeron, MD  torsemide (DEMADEX) 10 MG tablet Take 1 tablet (10 mg total) by mouth 2 (two) times a week. Monday and Friday 12/18/21   Tonia Ghent, MD    Current  Facility-Administered Medications  Medication Dose Route Frequency Provider Last Rate Last Admin   acetaminophen (TYLENOL) tablet 650 mg  650 mg Oral Q6H PRN Marcelyn Bruins, MD       Or   acetaminophen (TYLENOL) suppository 650 mg  650 mg Rectal Q6H PRN Marcelyn Bruins, MD       insulin aspart (novoLOG) injection 0-9 Units  0-9 Units Subcutaneous TID WC Marcelyn Bruins, MD       [START ON 08/08/2022] insulin glargine-yfgn [facility]) injection 15 Units  15 Units Subcutaneous Daily Marcelyn Bruins, MD       metoprolol succinate (TOPROL-XL) 24 hr tablet 25 mg  25 mg Oral QHS Marcelyn Bruins, MD       [START ON 08/08/2022] pantoprazole (PROTONIX) injection 40 mg  40 mg Intravenous Q24H Marcelyn Bruins, MD       polyethylene glycol (MIRALAX / GLYCOLAX) packet 17 g  17 g Oral Daily PRN Marcelyn Bruins, MD       [START ON 08/08/2022] rosuvastatin (CRESTOR) tablet 20 mg  20 mg Oral Daily Marcelyn Bruins, MD       sodium chloride flush (NS) 0.9 % injection 3 mL  3 mL Intravenous Q12H Marcelyn Bruins, MD       Current Outpatient Medications  Medication Sig Dispense Refill   amoxicillin (AMOXIL) 500 MG capsule Take 1 capsule (500 mg total) by mouth daily. 90 capsule 3   apixaban (ELIQUIS) 2.5 MG TABS tablet Held as of 08/05/22     B-D UF III MINI PEN  NEEDLES 31G X 5 MM MISC USE DAILY WITH INSULIN PEN 100 each 4   cholecalciferol (VITAMIN D3) 25 MCG (1000 UNIT) tablet Take 1,000 Units by mouth daily.     clotrimazole-betamethasone (LOTRISONE) cream APPLY TO AFFECTED AREA TWICE A DAY 30 g 0   dorzolamide-timolol (COSOPT) 22.3-6.8 MG/ML ophthalmic solution 1 drop daily in the afternoon. In both eye     ferrous sulfate 325 (65 FE) MG tablet TAKE 1 TABLET BY MOUTH ON FRIDAY     glipiZIDE (GLUCOTROL) 5 MG tablet TAKE 2 TABLETS BY MOUTH EVERY MORNING BEFORE BREAKFAST AND 1 TABLET BEFORE SUPPER 270 tablet 3   insulin glargine (LANTUS SOLOSTAR) 100 UNIT/ML Solostar Pen INJECT 34 UNITS INTO THE SKIN DAILY 15 mL 3   JANUVIA 50 MG tablet TAKE 1 TABLET BY MOUTH EVERY DAY 90 tablet 2   losartan (COZAAR) 100 MG tablet Held as of 08/05/22     lubiprostone (AMITIZA) 8 MCG capsule TAKE ONE CAPSULE BY MOUTH EACH MORNING 90 capsule 1   metoprolol succinate (TOPROL XL) 25 MG 24 hr tablet Take 1 tablet (25 mg total) by mouth at bedtime. 90 tablet 3   omeprazole (PRILOSEC) 40 MG capsule TAKE 1 CAPSULE BY MOUTH EVERY DAY 90 capsule 1   polyethylene glycol powder (GLYCOLAX/MIRALAX) 17 GM/SCOOP powder Take 17 g by mouth daily as needed. 3350 g 1   rosuvastatin (CRESTOR) 20 MG tablet Take 1 tablet (20 mg total) by mouth daily. 90 tablet 3   torsemide (DEMADEX) 10 MG tablet Take 1 tablet (10 mg total) by mouth 2 (two) times a week. Monday and Friday      Allergies as of 08/07/2022 - Review Complete 08/07/2022  Allergen Reaction Noted   Zoledronic acid Other (See Comments) 12/04/2013   Lokelma [sodium zirconium cyclosilicate]  99991111   Nsaids Other (See Comments) 10/18/2018    Family History  Problem Relation Age of Onset  Heart disease Mother        Before age 26   Diabetes Mother    Varicose Veins Mother    Heart attack Mother    Heart attack Father    Heart disease Father        After age 66   Hypertension Father    Heart disease Brother         Before age 37   Hypertension Brother    Heart attack Brother    Diabetes Son    Hypertension Son    Hypertension Son    Diabetes Son    Diabetes Other    Cancer Other    Colon cancer Neg Hx     Social History   Socioeconomic History   Marital status: Married    Spouse name: Not on file   Number of children: Not on file   Years of education: Not on file   Highest education level: Not on file  Occupational History   Occupation: retired    Fish farm manager: RETIRED  Tobacco Use   Smoking status: Former    Packs/day: 1.00    Years: 25.00    Additional pack years: 0.00    Total pack years: 25.00    Types: Cigarettes    Quit date: 01/21/1973    Years since quitting: 49.5    Passive exposure: Past   Smokeless tobacco: Never   Tobacco comments:    began smoking in high school, quit age 59  Vaping Use   Vaping Use: Never used  Substance and Sexual Activity   Alcohol use: Yes    Alcohol/week: 0.0 standard drinks of alcohol    Comment: wine occ   Drug use: No   Sexual activity: Never  Other Topics Concern   Not on file  Social History Narrative   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  Remarried 1998.     3 sons   Social Determinants of Health   Financial Resource Strain: Low Risk  (12/15/2021)   Overall Financial Resource Strain (CARDIA)    Difficulty of Paying Living Expenses: Not hard at all  Food Insecurity: No Food Insecurity (12/15/2021)   Hunger Vital Sign    Worried About Running Out of Food in the Last Year: Never true    Ran Out of Food in the Last Year: Never true  Transportation Needs: No Transportation Needs (12/15/2021)   PRAPARE - Hydrologist (Medical): No    Lack of Transportation (Non-Medical): No  Physical Activity: Inactive (12/15/2021)   Exercise Vital Sign    Days of Exercise per Week: 0 days    Minutes of Exercise per Session: 0 min  Stress: No Stress Concern Present (12/15/2021)    [place]    Feeling of Stress : Not at all  Social Connections: Not on file  Intimate Partner Violence: Not At Risk (12/12/2020)   Humiliation, Afraid, Rape, and Kick questionnaire    Fear of Current or Ex-Partner: No    Emotionally Abused: No    Physically Abused: No    Sexually Abused: No    Review of Systems: ROS is O/W negative except as mentioned in HPI.  Physical Exam: Vital signs in last 24 hours: Temp:  [97 F (36.1 C)-97.9 F (36.6 C)] 97.9 F (36.6 C) (03/15 1624) Pulse Rate:  [53-86] 65 (03/15 1600) Resp:  [12-19] 15 (03/15 1600)  BP: (106-147)/(42-97) 120/97 (03/15 1600) SpO2:  [95 %-100 %] 97 % (03/15 1600) Weight:  [89.8 kg] 89.8 kg (03/15 1051)   General:  Alert, Well-developed, well-nourished, pleasant and cooperative in NAD Head:  Normocephalic and atraumatic. Eyes:  Sclera clear, no icterus.  Conjunctiva pink. Ears:  Normal auditory acuity. Mouth:  No deformity or lesions.   Lungs:  Clear throughout to auscultation.  No wheezes, crackles, or rhonchi.  Heart:  Regular rate and rhythm; no murmurs, clicks, rubs, or gallops. Abdomen:  Soft, non-distended.  BS present.  Non-tender.   Rectal:  Deferred.  Hemoccult positive.  Msk:  Symmetrical without gross deformities. Pulses:  Normal pulses noted. Extremities:  Without clubbing or edema. Neurologic:  Alert and oriented x 4;  grossly normal neurologically. Skin:  Intact without significant lesions or rashes. Psych:  Alert and cooperative. Normal mood and affect.  Lab Results: Recent Labs    08/07/22 1238  WBC 7.2  HGB 8.7*  HCT 27.6*  PLT 88*   BMET Recent Labs    08/07/22 1238  NA 141  K 6.1*  CL 117*  CO2 16*  GLUCOSE 204*  BUN 47*  CREATININE 2.78*  CALCIUM 8.8*   LFT Recent Labs    08/07/22 1238  PROT 5.9*  ALBUMIN 3.4*  AST 27  ALT 27  ALKPHOS 96  BILITOT 0.8   IMPRESSION:  *Anemia normal with hemoglobin of  8.7 g.  Down from 12.4 g about 3 months ago.  Reports black stools and was Hemoccult positive.  Has been using some ibuprofen for back pain. *Chronic kidney disease stage IIIb *Chronic anticoagulation with Eliquis due to atrial fibrillation: Last dose 3/13 evening. *History of colon polyps: Had several tubular adenomas removed on colonoscopy in 2016. *Back pain due to fractures from a fall in October   PLAN: -Will increase pantoprazole to 40 mg BID for now. -Monitor Hgb and transfuse prn. -Patient agreeable to EGD or even colonoscopy as well if we think necessary.  Likely neither of these would occur until Sunday, but I did place him n.p.o. after midnight just in case we have opening for EGD tomorrow.  Laban Emperor. Rayne Loiseau  08/07/2022, 4:49 PM

## 2022-08-07 NOTE — H&P (View-Only) (Signed)
Referring Provider: Dr. Trilby Drummer Primary Care Physician:  Tonia Ghent, MD Primary Gastroenterologist:  Dr. Fuller Plan  Reason for Consultation:  Black stool, anemia  HPI: Dean Murphy is a 89 y.o. male with medical history significant of stroke on Eliquis, CAD status post CABG, PVD status post bilateral CEA, A-fib, complete heart block status post pacemaker, GERD, GI bleed, hypertension, hyperlipidemia, diabetes, CKD 3, diastolic CHF presenting with weakness and black stools.   Patient sent from PCP. Has had 3 to 4 weeks of black stools and saw his PCP 3 days ago noted to have hemoglobin drop from 12. 4 grams three months ago to 9.3 grams. Total stop anticoagulations so last dose of Eliquis was 3/13 evening.  Had also been using some ibuprofen for his back pain so was told to discontinue that as well.  He has continued to have dark stools, 5 this AM and spoke with his PCP again who recommended he proceed to the ED.   Here hemoglobin is 8.7 g.  He is Hemoccult positive.  Denies any red blood in his stool.  No nausea or vomiting.  No abdominal pain.  Complains of a lot of back pain from some fractures after having a fall back in October.  Has been placed on pantoprazole IV.  Colonoscopy 09/2014:  1. Six sessile polyps in the descending, sigmoid, ascending and transverse colon; polypectomies performed with a cold snare 2. Sessile polyp in the ascending colon; polypectomy performed using snare cautery 3. Moderate diverticulosis noted in the sigmoid colon and descending colon 4. External hemorrhoids   Tubular adenomas.  EGD 09/2014:  Normal.   Past Medical History:  Diagnosis Date   Anemia    Arthritis    CAD (coronary artery disease)    a. CABG 1989, b. Myoview low risk 2012.   Carotid artery occlusion    a. Duplex 10/2014: patent R/L CEA with mild hyperplasia in right surgical bulb - followed by vascular.   CHB (complete heart block) [facility]) March 2016   a. s/p STJ dual chamber pacemaker  07/2014.   Chronic diastolic CHF (congestive heart failure) ([place])    a. Dx [id number] - acute diastolic CHF in the setting of CHB.   Chronic kidney disease, stage IV (severe) (HCC)    stage III/IV as of 2015, [name]   Colon polyps    Complication of anesthesia    Constipation    CVA (cerebral infarction)    Diabetes mellitus    type II   Diverticulosis    Dupuytren's disease    finger right hand contracted   Essential hypertension    GERD (gastroesophageal reflux disease)    Hemorrhoids    Hyperlipidemia    Myocardial infarction ([place]) 1976  and Mar. 19, 2016   Orthostasis    Osteoporosis    PAF (paroxysmal atrial fibrillation) ([place]) March 2016   PONV (postoperative nausea and vomiting)    Presence of permanent cardiac pacemaker    Shortness of breath dyspnea    due to pain    Past Surgical History:  Procedure Laterality Date   CARDIAC CATHETERIZATION  08/11/2014   Procedure: TEMPORARY PACEMAKER;  Surgeon: [name], MD;  Location: [facility] CATH LAB;  Service: Cardiovascular;;   CAROTID ENDARTERECTOMY  12/31/10   Right   CAROTID ENDARTERECTOMY  02/09/11   left   CHOLECYSTECTOMY     CORONARY ARTERY BYPASS GRAFT  1989   ENDOSCOPIC RETROGRADE CHOLANGIOPANCREATOGRAPHY (ERCP) WITH PROPOFOL N/A 01/27/2022   Procedure: ENDOSCOPIC  RETROGRADE CHOLANGIOPANCREATOGRAPHY (ERCP) WITH PROPOFOL;  Surgeon: Ladene Artist, MD;  Location: [facility];  Service: Gastroenterology;  Laterality: N/A;   EYE SURGERY Bilateral    Cataract with implants   I & D KNEE WITH POLY EXCHANGE Left 07/29/2015   Procedure: IRRIGATION AND DEBRIDEMENT LEFT KNEE WITH POLY EXCHANGE;  Surgeon: Rod Can, MD;  Location: [place];  Service: Orthopedics;  Laterality: Left;   IR GENERIC HISTORICAL  03/04/2016   IR US GUIDE VASC ACCESS RIGHT 03/04/2016 Marybelle Killings, MD [facility]   IR GENERIC HISTORICAL  03/04/2016   IR FLUORO GUIDE CV LINE RIGHT 03/04/2016 Marybelle Killings, MD [facility]   JOINT REPLACEMENT      bilat. knees   PERMANENT PACEMAKER INSERTION N/A 08/13/2014   STJ dual chamber pacemaker implanted by [name] for [place] OF STONES  01/27/2022   Procedure: REMOVAL OF STONES;  Surgeon: Ladene Artist, MD;  Location: [facility];  Service: Gastroenterology;;   Joan Mayans  01/27/2022   Procedure: Joan Mayans;  Surgeon: Ladene Artist, MD;  Location: [facility];  Service: Gastroenterology;;   TONSILLECTOMY     TOTAL KNEE ARTHROPLASTY     bilateral    Prior to Admission medications   Medication Sig Start Date End Date Taking? Authorizing Provider  amoxicillin (AMOXIL) 500 MG capsule Take 1 capsule (500 mg total) by mouth daily. 07/22/22   Michel Bickers, MD  apixaban Arne Cleveland) 2.5 MG TABS tablet Held as of 08/05/22 08/05/22   Tonia Ghent, MD  B-D UF III MINI PEN NEEDLES 31G X 5 MM MISC USE DAILY WITH INSULIN PEN 08/18/21   Tonia Ghent, MD  cholecalciferol (VITAMIN D3) 25 MCG (1000 UNIT) tablet Take 1,000 Units by mouth daily.    [provider]  clotrimazole-betamethasone (LOTRISONE) cream APPLY TO AFFECTED AREA TWICE A DAY 07/09/21   Tonia Ghent, MD  dorzolamide-timolol (COSOPT) 22.3-6.8 MG/ML ophthalmic solution 1 drop daily in the afternoon. In both eye 01/16/22   [provider]  ferrous sulfate 325 (65 FE) MG tablet TAKE 1 TABLET BY MOUTH ON FRIDAY 12/17/20   Tonia Ghent, MD  glipiZIDE (GLUCOTROL) 5 MG tablet TAKE 2 TABLETS BY MOUTH EVERY MORNING BEFORE BREAKFAST AND 1 TABLET BEFORE SUPPER 09/22/21   Tonia Ghent, MD  insulin glargine (LANTUS SOLOSTAR) 100 UNIT/ML Solostar Pen INJECT 34 UNITS INTO THE SKIN DAILY 06/18/22   Tonia Ghent, MD  JANUVIA 50 MG tablet TAKE 1 TABLET BY MOUTH EVERY DAY 03/16/22   Tonia Ghent, MD  losartan (COZAAR) 100 MG tablet Held as of 08/05/22 08/05/22   Tonia Ghent, MD  lubiprostone (AMITIZA) 8 MCG capsule TAKE ONE CAPSULE BY MOUTH EACH MORNING 01/27/22   Tonia Ghent, MD  metoprolol  succinate (TOPROL XL) 25 MG 24 hr tablet Take 1 tablet (25 mg total) by mouth at bedtime. 06/18/22   Freada Bergeron, MD  omeprazole (PRILOSEC) 40 MG capsule TAKE 1 CAPSULE BY MOUTH EVERY DAY 06/18/22   Tonia Ghent, MD  polyethylene glycol powder (GLYCOLAX/MIRALAX) 17 GM/SCOOP powder Take 17 g by mouth daily as needed. 04/20/22   Tonia Ghent, MD  rosuvastatin (CRESTOR) 20 MG tablet Take 1 tablet (20 mg total) by mouth daily. 08/18/21   Freada Bergeron, MD  torsemide (DEMADEX) 10 MG tablet Take 1 tablet (10 mg total) by mouth 2 (two) times a week. Monday and Friday 12/18/21   Tonia Ghent, MD    Current  Facility-Administered Medications  Medication Dose Route Frequency Provider Last Rate Last Admin   acetaminophen (TYLENOL) tablet 650 mg  650 mg Oral Q6H PRN Marcelyn Bruins, MD       Or   acetaminophen (TYLENOL) suppository 650 mg  650 mg Rectal Q6H PRN Marcelyn Bruins, MD       insulin aspart (novoLOG) injection 0-9 Units  0-9 Units Subcutaneous TID WC Marcelyn Bruins, MD       [START ON 08/08/2022] insulin glargine-y[facility]gn [facility]) injection 15 Units  15 Units Subcutaneous Daily Marcelyn Bruins, MD       metoprolol succinate (TOPROL-XL) 24 hr tablet 25 mg  25 mg Oral QHS Marcelyn Bruins, MD       [START ON 08/08/2022] pantoprazole (PROTONIX) injection 40 mg  40 mg Intravenous Q24H Marcelyn Bruins, MD       polyethylene glycol (MIRALAX / GLYCOLAX) packet 17 g  17 g Oral Daily PRN Marcelyn Bruins, MD       [START ON 08/08/2022] rosuvastatin (CRESTOR) tablet 20 mg  20 mg Oral Daily Marcelyn Bruins, MD       sodium chloride flush (NS) 0.9 % injection 3 mL  3 mL Intravenous Q12H Marcelyn Bruins, MD       Current Outpatient Medications  Medication Sig Dispense Refill   amoxicillin (AMOXIL) 500 MG capsule Take 1 capsule (500 mg total) by mouth daily. 90 capsule 3   apixaban (ELIQUIS) 2.5 MG TABS tablet Held as of 08/05/22     B-D UF III MINI PEN  NEEDLES 31G X 5 MM MISC USE DAILY WITH INSULIN PEN 100 each 4   cholecalciferol (VITAMIN D3) 25 MCG (1000 UNIT) tablet Take 1,000 Units by mouth daily.     clotrimazole-betamethasone (LOTRISONE) cream APPLY TO AFFECTED AREA TWICE A DAY 30 g 0   dorzolamide-timolol (COSOPT) 22.3-6.8 MG/ML ophthalmic solution 1 drop daily in the afternoon. In both eye     ferrous sulfate 325 (65 FE) MG tablet TAKE 1 TABLET BY MOUTH ON FRIDAY     glipiZIDE (GLUCOTROL) 5 MG tablet TAKE 2 TABLETS BY MOUTH EVERY MORNING BEFORE BREAKFAST AND 1 TABLET BEFORE SUPPER 270 tablet 3   insulin glargine (LANTUS SOLOSTAR) 100 UNIT/ML Solostar Pen INJECT 34 UNITS INTO THE SKIN DAILY 15 mL 3   JANUVIA 50 MG tablet TAKE 1 TABLET BY MOUTH EVERY DAY 90 tablet 2   losartan (COZAAR) 100 MG tablet Held as of 08/05/22     lubiprostone (AMITIZA) 8 MCG capsule TAKE ONE CAPSULE BY MOUTH EACH MORNING 90 capsule 1   metoprolol succinate (TOPROL XL) 25 MG 24 hr tablet Take 1 tablet (25 mg total) by mouth at bedtime. 90 tablet 3   omeprazole (PRILOSEC) 40 MG capsule TAKE 1 CAPSULE BY MOUTH EVERY DAY 90 capsule 1   polyethylene glycol powder (GLYCOLAX/MIRALAX) 17 GM/SCOOP powder Take 17 g by mouth daily as needed. 3350 g 1   rosuvastatin (CRESTOR) 20 MG tablet Take 1 tablet (20 mg total) by mouth daily. 90 tablet 3   torsemide (DEMADEX) 10 MG tablet Take 1 tablet (10 mg total) by mouth 2 (two) times a week. Monday and Friday      Allergies as of 08/07/2022 - Review Complete 08/07/2022  Allergen Reaction Noted   Zoledronic acid Other (See Comments) 12/04/2013   Lokelma [sodium zirconium cyclosilicate]  99991111   Nsaids Other (See Comments) 10/18/2018    Family History  Problem Relation Age of Onset  Heart disease Mother        Before age 53   Diabetes Mother    Varicose Veins Mother    Heart attack Mother    Heart attack Father    Heart disease Father        After age 72   Hypertension Father    Heart disease Brother         Before age 18   Hypertension Brother    Heart attack Brother    Diabetes Son    Hypertension Son    Hypertension Son    Diabetes Son    Diabetes Other    Cancer Other    Colon cancer Neg Hx     Social History   Socioeconomic History   Marital status: Married    Spouse name: Not on file   Number of children: Not on file   Years of education: Not on file   Highest education level: Not on file  Occupational History   Occupation: retired    Fish farm manager: RETIRED  Tobacco Use   Smoking status: Former    Packs/day: 1.00    Years: 25.00    Additional pack years: 0.00    Total pack years: 25.00    Types: Cigarettes    Quit date: 01/21/1973    Years since quitting: 49.5    Passive exposure: Past   Smokeless tobacco: Never   Tobacco comments:    began smoking in high school, quit age 65  Vaping Use   Vaping Use: Never used  Substance and Sexual Activity   Alcohol use: Yes    Alcohol/week: 0.0 standard drinks of alcohol    Comment: wine occ   Drug use: No   Sexual activity: Never  Other Topics Concern   Not on file  Social History Narrative   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  Remarried 1998.     3 sons   Social Determinants of Health   Financial Resource Strain: Low Risk  (12/15/2021)   Overall Financial Resource Strain (CARDIA)    Difficulty of Paying Living Expenses: Not hard at all  Food Insecurity: No Food Insecurity (12/15/2021)   Hunger Vital Sign    Worried About Running Out of Food in the Last Year: Never true    Ran Out of Food in the Last Year: Never true  Transportation Needs: No Transportation Needs (12/15/2021)   PRAPARE - Hydrologist (Medical): No    Lack of Transportation (Non-Medical): No  Physical Activity: Inactive (12/15/2021)   Exercise Vital Sign    Days of Exercise per Week: 0 days    Minutes of Exercise per Session: 0 min  Stress: No Stress Concern Present (12/15/2021)    [place]    Feeling of Stress : Not at all  Social Connections: Not on file  Intimate Partner Violence: Not At Risk (12/12/2020)   Humiliation, Afraid, Rape, and Kick questionnaire    Fear of Current or Ex-Partner: No    Emotionally Abused: No    Physically Abused: No    Sexually Abused: No    Review of Systems: ROS is O/W negative except as mentioned in HPI.  Physical Exam: Vital signs in last 24 hours: Temp:  [97 F (36.1 C)-97.9 F (36.6 C)] 97.9 F (36.6 C) (03/15 1624) Pulse Rate:  [53-86] 65 (03/15 1600) Resp:  [12-19] 15 (03/15 1600)  BP: (106-147)/(42-97) 120/97 (03/15 1600) SpO2:  [95 %-100 %] 97 % (03/15 1600) Weight:  [89.8 kg] 89.8 kg (03/15 1051)   General:  Alert, Well-developed, well-nourished, pleasant and cooperative in NAD Head:  Normocephalic and atraumatic. Eyes:  Sclera clear, no icterus.  Conjunctiva pink. Ears:  Normal auditory acuity. Mouth:  No deformity or lesions.   Lungs:  Clear throughout to auscultation.  No wheezes, crackles, or rhonchi.  Heart:  Regular rate and rhythm; no murmurs, clicks, rubs, or gallops. Abdomen:  Soft, non-distended.  BS present.  Non-tender.   Rectal:  Deferred.  Hemoccult positive.  Msk:  Symmetrical without gross deformities. Pulses:  Normal pulses noted. Extremities:  Without clubbing or edema. Neurologic:  Alert and oriented x 4;  grossly normal neurologically. Skin:  Intact without significant lesions or rashes. Psych:  Alert and cooperative. Normal mood and affect.  Lab Results: Recent Labs    08/07/22 1238  WBC 7.2  HGB 8.7*  HCT 27.6*  PLT 88*   BMET Recent Labs    08/07/22 1238  NA 141  K 6.1*  CL 117*  CO2 16*  GLUCOSE 204*  BUN 47*  CREATININE 2.78*  CALCIUM 8.8*   LFT Recent Labs    08/07/22 1238  PROT 5.9*  ALBUMIN 3.4*  AST 27  ALT 27  ALKPHOS 96  BILITOT 0.8   IMPRESSION:  *Anemia normal with hemoglobin of  8.7 g.  Down from 12.4 g about 3 months ago.  Reports black stools and was Hemoccult positive.  Has been using some ibuprofen for back pain. *Chronic kidney disease stage IIIb *Chronic anticoagulation with Eliquis due to atrial fibrillation: Last dose 3/13 evening. *History of colon polyps: Had several tubular adenomas removed on colonoscopy in 2016. *Back pain due to fractures from a fall in October   PLAN: -Will increase pantoprazole to 40 mg BID for now. -Monitor Hgb and transfuse prn. -Patient agreeable to EGD or even colonoscopy as well if we think necessary.  Likely neither of these would occur until Sunday, but I did place him n.p.o. after midnight just in case we have opening for EGD tomorrow.  Laban Emperor. Deadra Diggins  08/07/2022, 4:49 PM

## 2022-08-07 NOTE — Progress Notes (Unsigned)
Off losartan and eliquis.  Still with black stools, multiple times this Am.  Having exertional chest pain but not at rest.  More SOB with exertion.  No CP or SOB at rest.  Abd not ttp at time of exam.    Prev labs d/w pt.  D/w pt about options.    Meds, vitals, and allergies reviewed.   ROS: Per HPI unless specifically indicated in ROS section   Nad Ncat Neck supple, no LA Rrr Ctab Abd soft, not ttp  Skin well perfused.

## 2022-08-07 NOTE — ED Provider Notes (Signed)
[facility] Provider Note   CSN: [id number] Arrival date & time: 08/07/22  1231     History  Chief Complaint  Patient presents with   Weakness    Dean Murphy is a 89 y.o. male.  HPI 89 year old male presents with black stools.  He was sent over via EMS from his PCPs office.  He has been dealing with black stools for about 3-4 weeks.  Saw his PCP a couple days ago where they checked blood and told him to stop his anticoagulation.  Today he is continue to have black stools and was asked to be rechecked and due to this they called 911 and sent him here.  He denies chest pain, shortness of breath or increased fatigue.  He denies any abdominal pain.  He has been dealing with back pain since a fall in September and has leg pain when he walks but no weakness in his legs.  Home Medications Prior to Admission medications   Medication Sig Start Date End Date Taking? Authorizing Provider  amoxicillin (AMOXIL) 500 MG capsule Take 1 capsule (500 mg total) by mouth daily. 07/22/22   Michel Bickers, MD  apixaban Arne Cleveland) 2.5 MG TABS tablet Held as of 08/05/22 08/05/22   Tonia Ghent, MD  B-D UF III MINI PEN NEEDLES 31G X 5 MM MISC USE DAILY WITH INSULIN PEN 08/18/21   Tonia Ghent, MD  cholecalciferol (VITAMIN D3) 25 MCG (1000 UNIT) tablet Take 1,000 Units by mouth daily.    [provider]  clotrimazole-betamethasone (LOTRISONE) cream APPLY TO AFFECTED AREA TWICE A DAY 07/09/21   Tonia Ghent, MD  dorzolamide-timolol (COSOPT) 22.3-6.8 MG/ML ophthalmic solution 1 drop daily in the afternoon. In both eye 01/16/22   [provider]  ferrous sulfate 325 (65 FE) MG tablet TAKE 1 TABLET BY MOUTH ON FRIDAY 12/17/20   Tonia Ghent, MD  glipiZIDE (GLUCOTROL) 5 MG tablet TAKE 2 TABLETS BY MOUTH EVERY MORNING BEFORE BREAKFAST AND 1 TABLET BEFORE SUPPER 09/22/21   Tonia Ghent, MD  insulin glargine (LANTUS SOLOSTAR) 100 UNIT/ML  Solostar Pen INJECT 34 UNITS INTO THE SKIN DAILY 06/18/22   Tonia Ghent, MD  JANUVIA 50 MG tablet TAKE 1 TABLET BY MOUTH EVERY DAY 03/16/22   Tonia Ghent, MD  losartan (COZAAR) 100 MG tablet Held as of 08/05/22 08/05/22   Tonia Ghent, MD  lubiprostone (AMITIZA) 8 MCG capsule TAKE ONE CAPSULE BY MOUTH EACH MORNING 01/27/22   Tonia Ghent, MD  metoprolol succinate (TOPROL XL) 25 MG 24 hr tablet Take 1 tablet (25 mg total) by mouth at bedtime. 06/18/22   Freada Bergeron, MD  omeprazole (PRILOSEC) 40 MG capsule TAKE 1 CAPSULE BY MOUTH EVERY DAY 06/18/22   Tonia Ghent, MD  polyethylene glycol powder (GLYCOLAX/MIRALAX) 17 GM/SCOOP powder Take 17 g by mouth daily as needed. 04/20/22   Tonia Ghent, MD  rosuvastatin (CRESTOR) 20 MG tablet Take 1 tablet (20 mg total) by mouth daily. 08/18/21   Freada Bergeron, MD  torsemide (DEMADEX) 10 MG tablet Take 1 tablet (10 mg total) by mouth 2 (two) times a week. Monday and Friday 12/18/21   Tonia Ghent, MD      Allergies    Zoledronic acid, Lokelma [sodium zirconium cyclosilicate], and Nsaids    Review of Systems   Review of Systems  Constitutional:  Negative for fatigue.  Respiratory:  Negative for shortness of breath.  Cardiovascular:  Negative for chest pain.  Gastrointestinal:  Positive for blood in stool. Negative for abdominal pain.  Neurological:  Negative for weakness.    Physical Exam Updated Vital Signs BP 106/88   Pulse 62   Temp (!) 97.5 F (36.4 C) (Oral)   Resp 19   SpO2 100%  Physical Exam Vitals and nursing note reviewed.  Constitutional:      Appearance: He is well-developed.  HENT:     Head: Normocephalic and atraumatic.  Cardiovascular:     Rate and Rhythm: Normal rate and regular rhythm.     Heart sounds: Murmur heard.  Pulmonary:     Effort: Pulmonary effort is normal.     Breath sounds: Normal breath sounds.  Abdominal:     General: There is no distension.     Palpations: Abdomen  is soft.     Tenderness: There is no abdominal tenderness.  Skin:    General: Skin is warm and dry.  Neurological:     Mental Status: He is alert.     Comments: 5/5 strength in BLE.     ED Results / Procedures / Treatments   Labs (all labs ordered are listed, but only abnormal results are displayed) Labs Reviewed  COMPREHENSIVE METABOLIC PANEL - Abnormal; Notable for the following components:      Result Value   Potassium 6.1 (*)    Chloride 117 (*)    CO2 16 (*)    Glucose, Bld 204 (*)    BUN 47 (*)    Creatinine, Ser 2.78 (*)    Calcium 8.8 (*)    Total Protein 5.9 (*)    Albumin 3.4 (*)    GFR, Estimated 21 (*)    All other components within normal limits  CBC WITH DIFFERENTIAL/PLATELET - Abnormal; Notable for the following components:   RBC 2.70 (*)    Hemoglobin 8.7 (*)    HCT 27.6 (*)    MCV 102.2 (*)    Platelets 88 (*)    All other components within normal limits  POC OCCULT BLOOD, ED - Abnormal; Notable for the following components:   Fecal Occult Bld POSITIVE (*)    All other components within normal limits  CBG MONITORING, ED - Abnormal; Notable for the following components:   Glucose-Capillary 121 (*)    All other components within normal limits  TYPE AND SCREEN    EKG EKG Interpretation  Date/Time:  Friday August 07 2022 12:35:08 EDT Ventricular Rate:  70 PR Interval:  231 QRS Duration: 177 QT Interval:  405 QTC Calculation: 437 R Axis:   -73 Text Interpretation: Sinus rhythm Prolonged PR interval Nonspecific IVCD with LAD LVH with secondary repolarization abnormality  no significant change since 2016 Confirmed by Sherwood Gambler [phone]) on 08/07/2022 12:50:36 PM  Radiology No results found.  Procedures .Critical Care  Performed by: Sherwood Gambler, MD Authorized by: Sherwood Gambler, MD   Critical care provider statement:    Critical care time (minutes):  30   Critical care time was exclusive of:  Separately billable procedures and treating  other patients   Critical care was necessary to treat or prevent imminent or life-threatening deterioration of the following conditions:  Metabolic crisis   Critical care was time spent personally by me on the following activities:  Development of treatment plan with patient or surrogate, discussions with consultants, evaluation of patient's response to treatment, examination of patient, ordering and review of laboratory studies, ordering and review of radiographic studies, ordering and  performing treatments and interventions, pulse oximetry, re-evaluation of patient's condition and review of old charts     Medications Ordered in ED Medications  patiromer Daryll Drown) packet 25.2 g (has no administration in time range)  pantoprazole (PROTONIX) injection 40 mg (40 mg Intravenous Given 08/07/22 1343)  insulin aspart (novoLOG) injection 5 Units (5 Units Intravenous Given 08/07/22 1534)    And  dextrose 50 % solution 50 mL (50 mLs Intravenous Given 08/07/22 1536)    ED Course/ Medical Decision Making/ A&P                             Medical Decision Making Amount and/or Complexity of Data Reviewed External Data Reviewed: notes.    Details: PCP notes from today Labs: ordered.    Details: Hemoglobin mildly lower at 8.7.  Potassium of 6.1.  Chronic kidney disease unchanged. ECG/medicine tests: independent interpretation performed.    Details: Chronic bundle branch block, no change from baseline.  Risk OTC drugs. Prescription drug management.   Patient presents with continued dark stools.  Has had a mild drop in his hemoglobin from a couple days ago.  He is not hypotensive or in distress but I think you will need admission given continued GI bleeding/blood loss.  Also found to have some mild hyperkalemia.  No overt EKG changes though hard to tell with his bundle branch block.  He was discussed with nephrology, Dr. Marval Regal, will give Veltassa 25 g and insulin/glucose after discussion.   Consulted hospitalist for admission.        Final Clinical Impression(s) / ED Diagnoses Final diagnoses:  Gastrointestinal hemorrhage, unspecified gastrointestinal hemorrhage type  Hyperkalemia    Rx / DC Orders ED Discharge Orders     None         Sherwood Gambler, MD 08/07/22 1557

## 2022-08-07 NOTE — H&P (Signed)
History and Physical   Dean Murphy [id number] DOB: June 12, 1926 DOA: 08/07/2022  PCP: Tonia Ghent, MD   Patient coming from: Home/PCP  Chief Complaint: Black Stools  HPI: Dean Murphy is a 89 y.o. male with medical history significant of stroke, CAD status post CABG, PVD status post bilateral CEA, A-fib, complete heart block status post pacemaker, GERD, GI bleed, hypertension, hyperlipidemia, diabetes, CKD 3, diastolic CHF presenting with weakness and black stools.  Patient sent from PCP.  Has had 3 to 4 weeks of black stools and saw his PCP 3 days ago noted to have hemoglobin drop from 12 3 months ago to 9.3.  Total stop anticoagulations.  He has continued to have dark stools and spoke with his PCP again who recommended he proceed to the ED.    He denies fevers, chills, chest pain, shortness of breath, abdominal pain, constipation, diarrhea, nausea, vomiting.   ED Course: Vital signs in the ED significant for blood pressure in the [id number] to [id number] systolic.  Lab workup included CMP with potassium 6.1, chloride 117, bicarb 16, glucose 204, BUN 97, creatinine stable 2.78, calcium 8.8, protein 5.9, albumin 3.4.  CBC with hemoglobin of 8.7 down from 9.33 days ago and 12 3 months ago as above, platelets 88.  FOBT positive.  Patient typed and screened in the ED.  Case was discussed with nephrology in the ED who recommended insulin/dextrose and Veltassa as well as following potassium trend, reconsult as needed.  Message sent to GI for consultation in ED as well.  Review of Systems: As per HPI otherwise all other systems reviewed and are negative.  Past Medical History:  Diagnosis Date   Anemia    Arthritis    CAD (coronary artery disease)    a. CABG 1989, b. Myoview low risk 2012.   Carotid artery occlusion    a. Duplex 10/2014: patent R/L CEA with mild hyperplasia in right surgical bulb - followed by vascular.   CHB (complete heart block) [facility]) March 2016   a. s/p STJ dual chamber pacemaker  07/2014.   Chronic diastolic CHF (congestive heart failure) ([place])    a. Dx [id number] - acute diastolic CHF in the setting of CHB.   Chronic kidney disease, stage IV (severe) (HCC)    stage III/IV as of 2015, [name]   Colon polyps    Complication of anesthesia    Constipation    CVA (cerebral infarction)    Diabetes mellitus    type II   Diverticulosis    Dupuytren's disease    finger right hand contracted   Essential hypertension    GERD (gastroesophageal reflux disease)    Hemorrhoids    Hyperlipidemia    Myocardial infarction ([employer]) 1976  and Mar. 19, 2016   Orthostasis    Osteoporosis    PAF (paroxysmal atrial fibrillation) ([place]) March 2016   PONV (postoperative nausea and vomiting)    Presence of permanent cardiac pacemaker    Shortness of breath dyspnea    due to pain    Past Surgical History:  Procedure Laterality Date   CARDIAC CATHETERIZATION  08/11/2014   Procedure: TEMPORARY PACEMAKER;  Surgeon: [name], MD;  Location: [facility] CATH LAB;  Service: Cardiovascular;;   CAROTID ENDARTERECTOMY  12/31/10   Right   CAROTID ENDARTERECTOMY  02/09/11   left   CHOLECYSTECTOMY     CORONARY ARTERY BYPASS GRAFT  1989   ENDOSCOPIC RETROGRADE CHOLANGIOPANCREATOGRAPHY (ERCP) WITH PROPOFOL N/A 01/27/2022   Procedure: ENDOSCOPIC  RETROGRADE CHOLANGIOPANCREATOGRAPHY (ERCP) WITH PROPOFOL;  Surgeon: Ladene Artist, MD;  Location: [facility];  Service: Gastroenterology;  Laterality: N/A;   EYE SURGERY Bilateral    Cataract with implants   I & D KNEE WITH POLY EXCHANGE Left 07/29/2015   Procedure: IRRIGATION AND DEBRIDEMENT LEFT KNEE WITH POLY EXCHANGE;  Surgeon: Rod Can, MD;  Location: [place];  Service: Orthopedics;  Laterality: Left;   IR GENERIC HISTORICAL  03/04/2016   IR US GUIDE VASC ACCESS RIGHT 03/04/2016 Marybelle Killings, MD [facility]   IR GENERIC HISTORICAL  03/04/2016   IR FLUORO GUIDE CV LINE RIGHT 03/04/2016 Marybelle Killings, MD [facility]   JOINT REPLACEMENT      bilat. knees   PERMANENT PACEMAKER INSERTION N/A 08/13/2014   STJ dual chamber pacemaker implanted by [name] for [place] OF STONES  01/27/2022   Procedure: REMOVAL OF STONES;  Surgeon: Ladene Artist, MD;  Location: [facility];  Service: Gastroenterology;;   Joan Mayans  01/27/2022   Procedure: Joan Mayans;  Surgeon: Ladene Artist, MD;  Location: [facility];  Service: Gastroenterology;;   TONSILLECTOMY     TOTAL KNEE ARTHROPLASTY     bilateral    Social History  reports that he quit smoking about 49 years ago. His smoking use included cigarettes. He has a 25.00 pack-year smoking history. He has been exposed to tobacco smoke. He has never used smokeless tobacco. He reports current alcohol use. He reports that he does not use drugs.  Allergies  Allergen Reactions   Zoledronic Acid Other (See Comments)    Stopped 2015 due to Creatine  Other reaction(s): Unknown Stopped 2015 due to Cr   Lokelma [Sodium Zirconium Cyclosilicate]     Nausea/vomiting   Nsaids Other (See Comments)    Held due to creatinine elevation.      Family History  Problem Relation Age of Onset   Heart disease Mother        Before age 12   Diabetes Mother    Varicose Veins Mother    Heart attack Mother    Heart attack Father    Heart disease Father        After age 36   Hypertension Father    Heart disease Brother        Before age 39   Hypertension Brother    Heart attack Brother    Diabetes Son    Hypertension Son    Hypertension Son    Diabetes Son    Diabetes Other    Cancer Other    Colon cancer Neg Hx   Reviewed on admission  Prior to Admission medications   Medication Sig Start Date End Date Taking? Authorizing Provider  amoxicillin (AMOXIL) 500 MG capsule Take 1 capsule (500 mg total) by mouth daily. 07/22/22   Michel Bickers, MD  apixaban Arne [place]) 2.5 MG TABS tablet Held as of 08/05/22 08/05/22   Tonia Ghent, MD  B-D UF III MINI PEN NEEDLES 31G X 5 MM MISC USE  DAILY WITH INSULIN PEN 08/18/21   Tonia Ghent, MD  cholecalciferol (VITAMIN D3) 25 MCG (1000 UNIT) tablet Take 1,000 Units by mouth daily.    [provider]  clotrimazole-betamethasone (LOTRISONE) cream APPLY TO AFFECTED AREA TWICE A DAY 07/09/21   Tonia Ghent, MD  dorzolamide-timolol (COSOPT) 22.3-6.8 MG/ML ophthalmic solution 1 drop daily in the afternoon. In both eye 01/16/22   [provider]  ferrous sulfate 325 (65 FE) MG tablet TAKE 1  TABLET BY MOUTH ON FRIDAY 12/17/20   Tonia Ghent, MD  glipiZIDE (GLUCOTROL) 5 MG tablet TAKE 2 TABLETS BY MOUTH EVERY MORNING BEFORE BREAKFAST AND 1 TABLET BEFORE SUPPER 09/22/21   Tonia Ghent, MD  insulin glargine (LANTUS SOLOSTAR) 100 UNIT/ML Solostar Pen INJECT 34 UNITS INTO THE SKIN DAILY 06/18/22   Tonia Ghent, MD  JANUVIA 50 MG tablet TAKE 1 TABLET BY MOUTH EVERY DAY 03/16/22   Tonia Ghent, MD  losartan (COZAAR) 100 MG tablet Held as of 08/05/22 08/05/22   Tonia Ghent, MD  lubiprostone (AMITIZA) 8 MCG capsule TAKE ONE CAPSULE BY MOUTH EACH MORNING 01/27/22   Tonia Ghent, MD  metoprolol succinate (TOPROL XL) 25 MG 24 hr tablet Take 1 tablet (25 mg total) by mouth at bedtime. 06/18/22   Freada Bergeron, MD  omeprazole (PRILOSEC) 40 MG capsule TAKE 1 CAPSULE BY MOUTH EVERY DAY 06/18/22   Tonia Ghent, MD  polyethylene glycol powder (GLYCOLAX/MIRALAX) 17 GM/SCOOP powder Take 17 g by mouth daily as needed. 04/20/22   Tonia Ghent, MD  rosuvastatin (CRESTOR) 20 MG tablet Take 1 tablet (20 mg total) by mouth daily. 08/18/21   Freada Bergeron, MD  torsemide (DEMADEX) 10 MG tablet Take 1 tablet (10 mg total) by mouth 2 (two) times a week. Monday and Friday 12/18/21   Tonia Ghent, MD    Physical Exam: Vitals:   08/07/22 1234 08/07/22 1400 08/07/22 1500  BP: (!) 147/71 (!) 127/52 106/88  Pulse: 75 (!) 56 62  Resp: 16 12 19   Temp: (!) 97.5 F (36.4 C)    TempSrc: Oral    SpO2: 99% 100% 100%     Physical Exam Constitutional:      General: He is not in acute distress.    Appearance: Normal appearance.  HENT:     Head: Normocephalic and atraumatic.     Mouth/Throat:     Mouth: Mucous membranes are moist.     Pharynx: Oropharynx is clear.  Eyes:     Extraocular Movements: Extraocular movements intact.     Pupils: Pupils are equal, round, and reactive to light.  Cardiovascular:     Rate and Rhythm: Normal rate and regular rhythm.     Pulses: Normal pulses.     Heart sounds: Normal heart sounds.  Pulmonary:     Effort: Pulmonary effort is normal. No respiratory distress.     Breath sounds: Normal breath sounds.  Abdominal:     General: Bowel sounds are normal. There is no distension.     Palpations: Abdomen is soft.     Tenderness: There is no abdominal tenderness.  Musculoskeletal:        General: No swelling or deformity.  Skin:    General: Skin is warm and dry.  Neurological:     General: No focal deficit present.     Mental Status: Mental status is at baseline.    Labs on Admission: I have personally reviewed following labs and imaging studies  CBC: Recent Labs  Lab 08/04/22 1142 08/07/22 1238  WBC 8.3 7.2  NEUTROABS 5.5 5.1  HGB 9.3* 8.7*  HCT 28.4* 27.6*  MCV 96.2 102.2*  PLT 112.0* 88*    Basic Metabolic Panel: Recent Labs  Lab 08/04/22 1142 08/07/22 1238  NA 144 141  K 5.5* 6.1*  CL 118* 117*  CO2 19 16*  GLUCOSE 56* 204*  BUN 52* 47*  CREATININE 2.69* 2.78*  CALCIUM 9.2 8.8*  GFR: Estimated Creatinine Clearance: 17.7 mL/min (A) (by C-G formula based on SCr of 2.78 mg/dL (H)).  Liver Function Tests: Recent Labs  Lab 08/04/22 1142 08/07/22 1238  AST 23 27  ALT 22 27  ALKPHOS 98 96  BILITOT 0.4 0.8  PROT 6.0 5.9*  ALBUMIN 3.6 3.4*    Urine analysis:    Component Value Date/Time   COLORURINE YELLOW 07/29/2015 1106   APPEARANCEUR CLOUDY (A) 07/29/2015 1106   LABSPEC 1.025 07/29/2015 1106   PHURINE 5.0 07/29/2015 1106    GLUCOSEU NEGATIVE 07/29/2015 1106   HGBUR MODERATE (A) 07/29/2015 1106   HGBUR negative 09/16/2009 1053   BILIRUBINUR NEGATIVE 07/29/2015 1106   BILIRUBINUR n 01/30/2011 1104   KETONESUR NEGATIVE 07/29/2015 1106   PROTEINUR 100 (A) 07/29/2015 1106   UROBILINOGEN 0.2 02/05/2011 1008   NITRITE NEGATIVE 07/29/2015 1106   LEUKOCYTESUR NEGATIVE 07/29/2015 1106    Radiological Exams on Admission: No results found.  EKG: Independently reviewed. Paced rhythm at 70 bpm.  Similar to previous.  Assessment/Plan Active Problems:   Paroxysmal atrial fibrillation (HCC)   DM (diabetes mellitus), type 2 with renal complications ([place])   Dyslipidemia   Essential hypertension   GERD   Chronic kidney disease, stage III (moderate) (HCC)   PVD- s/p bilat CEA- CA dopplers OK Jan 2015   Complete heart block (HCC)   S/P CABG x 5 1989. Low risk Myoview 2012   History of CVA (cerebrovascular accident)   Coronary artery disease   Chronic diastolic heart failure (HCC)   GI bleed Weakness > Patient presenting with some mild general weakness as well as black stools that been ongoing.  > Evaluated by PCP a few days ago and noted to have hemoglobin drop from 12-9.3.  Has continued to have dark stools since that time and hemoglobin in the ED 8.7. > FOBT positive.  GI consulted by EDP.  Received IV PPI. Hx of ERCP for Choledocholithiasis w/o comment of gastritis or ulcers. - Monitor on telemetry - Appreciate GI recommendations - IV PPI - Clear liquid diet - Trend hemoglobin, transfuse for hemoglobin less than 8 given history of CHF/CAD  Hyperkalemia CKD 3B > Noted to have potassium of 6.1 in the ED.  Creatinine stable at 2.78.  Received insulin/dextrose as well as a dose of Veltassa.  Continue to trend overnight. - Trend renal function and electrolytes  Fall Back Pain > Reports significant pain on standing since fall in September. Not yet referred to ortho.  History of CVA - Continue home  rosuvastatin  CAD > Status post CABG - Continue home metoprolol, rosuvastatin - Holding home losartan  PVD > Status post bilateral CEA - Continue home rosuvastatin  Paroxysmal A-fib - Continue home metoprolol - Holding home Eliquis  Complete heart block > Status post pacemaker - Noted.  Paced rhythm in ED.  GERD - IV PPI as above  Hypertension - Holding home losartan - Is on torsemide twice a week - Continue home metoprolol  Hyperlipidemia - Continue home rosuvastatin  Diabetes > 34 units daily at home - 10 units daily - SSI  Chronic diastolic CHF > Last echo in 2019 with EF 55-60%, G1 DD, normal RV function. > Takes torsemide twice a week, holding for now. - Continue home metoprolol - Holding home losartan as above  DVT prophylaxis: SCDs Code Status:   Full Family Communication:  Updated at bedside  Disposition Plan:   Patient is from:  Home  Anticipated DC to:  Home  Anticipated  DC date:  1 to 3 days  Anticipated DC barriers: None  Consults called:  EDP spoke with nephrology but they are not following, reconsult as needed.  EDP has consulted gastroenterology as well. Admission status:  Observation, telemetry  Severity of Illness: The appropriate patient status for this patient is OBSERVATION. Observation status is judged to be reasonable and necessary in order to provide the required intensity of service to ensure the patient's safety. The patient's presenting symptoms, physical exam findings, and initial radiographic and laboratory data in the context of their medical condition is felt to place them at decreased risk for further clinical deterioration. Furthermore, it is anticipated that the patient will be medically stable for discharge from the hospital within 2 midnights of admission.    Marcelyn Bruins MD [facility]  How to contact the [facility] Attending or Consulting provider [place] or covering provider during after hours [place], for this  patient?   Check the care team in [facility] and look for a) attending/consulting [facility] provider listed and b) the [facility] team listed Log into [email or web address] and use [place] universal password to access. If you do not have the password, please contact the hospital operator. Locate the [facility] provider you are looking for under [facility] and page to a number that you can be directly reached. If you still have difficulty reaching the provider, please page the [facility] (Director on Call) for the Hospitalists listed on amion for assistance.  08/07/2022, 4:00 PM

## 2022-08-08 DIAGNOSIS — K633 Ulcer of intestine: Secondary | ICD-10-CM | POA: Diagnosis not present

## 2022-08-08 DIAGNOSIS — K921 Melena: Secondary | ICD-10-CM | POA: Diagnosis not present

## 2022-08-08 DIAGNOSIS — K279 Peptic ulcer, site unspecified, unspecified as acute or chronic, without hemorrhage or perforation: Secondary | ICD-10-CM | POA: Diagnosis present

## 2022-08-08 DIAGNOSIS — R195 Other fecal abnormalities: Secondary | ICD-10-CM | POA: Diagnosis not present

## 2022-08-08 DIAGNOSIS — I442 Atrioventricular block, complete: Secondary | ICD-10-CM | POA: Diagnosis present

## 2022-08-08 DIAGNOSIS — K449 Diaphragmatic hernia without obstruction or gangrene: Secondary | ICD-10-CM | POA: Diagnosis present

## 2022-08-08 DIAGNOSIS — Z951 Presence of aortocoronary bypass graft: Secondary | ICD-10-CM | POA: Diagnosis not present

## 2022-08-08 DIAGNOSIS — K922 Gastrointestinal hemorrhage, unspecified: Secondary | ICD-10-CM | POA: Diagnosis present

## 2022-08-08 DIAGNOSIS — E872 Acidosis, unspecified: Secondary | ICD-10-CM

## 2022-08-08 DIAGNOSIS — E785 Hyperlipidemia, unspecified: Secondary | ICD-10-CM | POA: Diagnosis present

## 2022-08-08 DIAGNOSIS — K5140 Inflammatory polyps of colon without complications: Secondary | ICD-10-CM | POA: Diagnosis not present

## 2022-08-08 DIAGNOSIS — K5731 Diverticulosis of large intestine without perforation or abscess with bleeding: Secondary | ICD-10-CM | POA: Diagnosis present

## 2022-08-08 DIAGNOSIS — E8722 Chronic metabolic acidosis: Secondary | ICD-10-CM | POA: Diagnosis present

## 2022-08-08 DIAGNOSIS — K5521 Angiodysplasia of colon with hemorrhage: Secondary | ICD-10-CM | POA: Diagnosis present

## 2022-08-08 DIAGNOSIS — E875 Hyperkalemia: Secondary | ICD-10-CM | POA: Diagnosis present

## 2022-08-08 DIAGNOSIS — D126 Benign neoplasm of colon, unspecified: Secondary | ICD-10-CM | POA: Diagnosis not present

## 2022-08-08 DIAGNOSIS — I480 Paroxysmal atrial fibrillation: Secondary | ICD-10-CM | POA: Diagnosis present

## 2022-08-08 DIAGNOSIS — N184 Chronic kidney disease, stage 4 (severe): Secondary | ICD-10-CM | POA: Diagnosis present

## 2022-08-08 DIAGNOSIS — D125 Benign neoplasm of sigmoid colon: Secondary | ICD-10-CM | POA: Diagnosis not present

## 2022-08-08 DIAGNOSIS — D62 Acute posthemorrhagic anemia: Secondary | ICD-10-CM

## 2022-08-08 DIAGNOSIS — E876 Hypokalemia: Secondary | ICD-10-CM | POA: Diagnosis not present

## 2022-08-08 DIAGNOSIS — K317 Polyp of stomach and duodenum: Secondary | ICD-10-CM | POA: Diagnosis not present

## 2022-08-08 DIAGNOSIS — K5520 Angiodysplasia of colon without hemorrhage: Secondary | ICD-10-CM | POA: Diagnosis not present

## 2022-08-08 DIAGNOSIS — I2510 Atherosclerotic heart disease of native coronary artery without angina pectoris: Secondary | ICD-10-CM | POA: Diagnosis not present

## 2022-08-08 DIAGNOSIS — Z79899 Other long term (current) drug therapy: Secondary | ICD-10-CM | POA: Diagnosis not present

## 2022-08-08 DIAGNOSIS — M4856XA Collapsed vertebra, not elsewhere classified, lumbar region, initial encounter for fracture: Secondary | ICD-10-CM | POA: Diagnosis present

## 2022-08-08 DIAGNOSIS — K648 Other hemorrhoids: Secondary | ICD-10-CM | POA: Diagnosis present

## 2022-08-08 DIAGNOSIS — D696 Thrombocytopenia, unspecified: Secondary | ICD-10-CM | POA: Diagnosis present

## 2022-08-08 DIAGNOSIS — Z87891 Personal history of nicotine dependence: Secondary | ICD-10-CM | POA: Diagnosis not present

## 2022-08-08 DIAGNOSIS — D122 Benign neoplasm of ascending colon: Secondary | ICD-10-CM | POA: Diagnosis present

## 2022-08-08 DIAGNOSIS — I5032 Chronic diastolic (congestive) heart failure: Secondary | ICD-10-CM | POA: Diagnosis present

## 2022-08-08 DIAGNOSIS — I130 Hypertensive heart and chronic kidney disease with heart failure and stage 1 through stage 4 chronic kidney disease, or unspecified chronic kidney disease: Secondary | ICD-10-CM | POA: Diagnosis present

## 2022-08-08 DIAGNOSIS — D509 Iron deficiency anemia, unspecified: Secondary | ICD-10-CM | POA: Diagnosis present

## 2022-08-08 DIAGNOSIS — D500 Iron deficiency anemia secondary to blood loss (chronic): Secondary | ICD-10-CM | POA: Diagnosis not present

## 2022-08-08 DIAGNOSIS — E1122 Type 2 diabetes mellitus with diabetic chronic kidney disease: Secondary | ICD-10-CM | POA: Diagnosis present

## 2022-08-08 DIAGNOSIS — E1151 Type 2 diabetes mellitus with diabetic peripheral angiopathy without gangrene: Secondary | ICD-10-CM | POA: Diagnosis present

## 2022-08-08 LAB — BASIC METABOLIC PANEL
Anion gap: 6 (ref 5–15)
Anion gap: 8 (ref 5–15)
Anion gap: 8 (ref 5–15)
BUN: 40 mg/dL — ABNORMAL HIGH (ref 8–23)
BUN: 39 mg/dL — ABNORMAL HIGH (ref 8–23)
BUN: 36 mg/dL — ABNORMAL HIGH (ref 8–23)
CO2: 18 mmol/L — ABNORMAL LOW (ref 22–32)
CO2: 17 mmol/L — ABNORMAL LOW (ref 22–32)
CO2: 19 mmol/L — ABNORMAL LOW (ref 22–32)
Calcium: 9 mg/dL (ref 8.9–10.3)
Calcium: 8.9 mg/dL (ref 8.9–10.3)
Calcium: 8.7 mg/dL — ABNORMAL LOW (ref 8.9–10.3)
Chloride: 118 mmol/L — ABNORMAL HIGH (ref 98–111)
Chloride: 117 mmol/L — ABNORMAL HIGH (ref 98–111)
Chloride: 112 mmol/L — ABNORMAL HIGH (ref 98–111)
Creatinine, Ser: 2.61 mg/dL — ABNORMAL HIGH (ref 0.61–1.24)
Creatinine, Ser: 2.46 mg/dL — ABNORMAL HIGH (ref 0.61–1.24)
Creatinine, Ser: 2.39 mg/dL — ABNORMAL HIGH (ref 0.61–1.24)
GFR, Estimated: 22 mL/min — ABNORMAL LOW (ref >60)
GFR, Estimated: 24 mL/min — ABNORMAL LOW (ref >60)
GFR, Estimated: 25 mL/min — ABNORMAL LOW (ref >60)
Glucose, Bld: 108 mg/dL — ABNORMAL HIGH (ref 70–99)
Glucose, Bld: 109 mg/dL — ABNORMAL HIGH (ref 70–99)
Glucose, Bld: 273 mg/dL — ABNORMAL HIGH (ref 70–99)
Potassium: 6 mmol/L — ABNORMAL HIGH (ref 3.5–5.1)
Potassium: 6 mmol/L — ABNORMAL HIGH (ref 3.5–5.1)
Potassium: 5.6 mmol/L — ABNORMAL HIGH (ref 3.5–5.1)
Sodium: 142 mmol/L (ref 135–145)
Sodium: 142 mmol/L (ref 135–145)
Sodium: 139 mmol/L (ref 135–145)

## 2022-08-08 LAB — COMPREHENSIVE METABOLIC PANEL
ALT: 25 U/L (ref 0–44)
AST: 23 U/L (ref 15–41)
Albumin: 2.8 g/dL — ABNORMAL LOW (ref 3.5–5.0)
Alkaline Phosphatase: 78 U/L (ref 38–126)
Anion gap: 4 — ABNORMAL LOW (ref 5–15)
BUN: 42 mg/dL — ABNORMAL HIGH (ref 8–23)
CO2: 18 mmol/L — ABNORMAL LOW (ref 22–32)
Calcium: 8.8 mg/dL — ABNORMAL LOW (ref 8.9–10.3)
Chloride: 118 mmol/L — ABNORMAL HIGH (ref 98–111)
Creatinine, Ser: 2.53 mg/dL — ABNORMAL HIGH (ref 0.61–1.24)
GFR, Estimated: 23 mL/min — ABNORMAL LOW (ref >60)
Glucose, Bld: 99 mg/dL (ref 70–99)
Potassium: 5.8 mmol/L — ABNORMAL HIGH (ref 3.5–5.1)
Sodium: 140 mmol/L (ref 135–145)
Total Bilirubin: 0.6 mg/dL (ref 0.3–1.2)
Total Protein: 5 g/dL — ABNORMAL LOW (ref 6.5–8.1)

## 2022-08-08 LAB — CBC
HCT: 22 % — ABNORMAL LOW (ref 39.0–52.0)
HCT: 24.4 % — ABNORMAL LOW (ref 39.0–52.0)
HCT: 26 % — ABNORMAL LOW (ref 39.0–52.0)
Hemoglobin: 7 g/dL — ABNORMAL LOW (ref 13.0–17.0)
Hemoglobin: 7.6 g/dL — ABNORMAL LOW (ref 13.0–17.0)
Hemoglobin: 8.2 g/dL — ABNORMAL LOW (ref 13.0–17.0)
MCH: 31.5 pg (ref 26.0–34.0)
MCH: 31.4 pg (ref 26.0–34.0)
MCH: 31.5 pg (ref 26.0–34.0)
MCHC: 31.8 g/dL (ref 30.0–36.0)
MCHC: 31.1 g/dL (ref 30.0–36.0)
MCHC: 31.5 g/dL (ref 30.0–36.0)
MCV: 99.1 fL (ref 80.0–100.0)
MCV: 100.8 fL — ABNORMAL HIGH (ref 80.0–100.0)
MCV: 100 fL (ref 80.0–100.0)
Platelets: 66 10*3/uL — ABNORMAL LOW (ref 150–400)
Platelets: 66 10*3/uL — ABNORMAL LOW (ref 150–400)
Platelets: 70 10*3/uL — ABNORMAL LOW (ref 150–400)
RBC: 2.22 MIL/uL — ABNORMAL LOW (ref 4.22–5.81)
RBC: 2.42 MIL/uL — ABNORMAL LOW (ref 4.22–5.81)
RBC: 2.6 MIL/uL — ABNORMAL LOW (ref 4.22–5.81)
RDW: 15.3 % (ref 11.5–15.5)
RDW: 15.2 % (ref 11.5–15.5)
RDW: 15.2 % (ref 11.5–15.5)
WBC: 4 10*3/uL (ref 4.0–10.5)
WBC: 3.5 10*3/uL — ABNORMAL LOW (ref 4.0–10.5)
WBC: 3.6 10*3/uL — ABNORMAL LOW (ref 4.0–10.5)
nRBC: 0 % (ref 0.0–0.2)
nRBC: 0 % (ref 0.0–0.2)
nRBC: 0 % (ref 0.0–0.2)

## 2022-08-08 LAB — GLUCOSE, CAPILLARY
Glucose-Capillary: 94 mg/dL (ref 70–99)
Glucose-Capillary: 135 mg/dL — ABNORMAL HIGH (ref 70–99)
Glucose-Capillary: 248 mg/dL — ABNORMAL HIGH (ref 70–99)

## 2022-08-08 LAB — MAGNESIUM: Magnesium: 2.4 mg/dL (ref 1.7–2.4)

## 2022-08-08 MED ORDER — SODIUM CHLORIDE 0.9 % IV SOLN: INTRAVENOUS | Status: DC

## 2022-08-08 MED ORDER — LUBIPROSTONE 8 MCG PO CAPS
8.0000 ug | ORAL_CAPSULE | Freq: Every day | ORAL | Status: DC
  Administered 2022-08-09 – 2022-08-13 (×4): 8 ug via ORAL
  Filled 2022-08-08 (×6): qty 1

## 2022-08-08 MED ORDER — INSULIN GLARGINE-YFGN 100 UNIT/ML ~~LOC~~ SOLN: 15.0000 [IU] | Freq: Every day | SUBCUTANEOUS | Status: DC

## 2022-08-08 MED ORDER — INSULIN GLARGINE-YFGN 100 UNIT/ML ~~LOC~~ SOLN
10.0000 [IU] | Freq: Once | SUBCUTANEOUS | Status: AC
  Administered 2022-08-08: 10 [IU] via SUBCUTANEOUS
  Filled 2022-08-08: qty 0.1

## 2022-08-08 MED ORDER — FUROSEMIDE 10 MG/ML IJ SOLN
60.0000 mg | Freq: Once | INTRAMUSCULAR | Status: AC
  Administered 2022-08-08: 60 mg via INTRAVENOUS
  Filled 2022-08-08: qty 6

## 2022-08-08 MED ORDER — PATIROMER SORBITEX CALCIUM 8.4 G PO PACK
16.8000 g | PACK | Freq: Every day | ORAL | Status: AC
  Administered 2022-08-08 – 2022-08-09 (×2): 16.8 g via ORAL
  Filled 2022-08-08 (×2): qty 2

## 2022-08-08 MED ORDER — SODIUM BICARBONATE 8.4 % IV SOLN
50.0000 meq | Freq: Once | INTRAVENOUS | Status: AC
  Administered 2022-08-08: 50 meq via INTRAVENOUS
  Filled 2022-08-08: qty 50

## 2022-08-08 MED ORDER — INSULIN GLARGINE-YFGN 100 UNIT/ML ~~LOC~~ SOLN
15.0000 [IU] | Freq: Every day | SUBCUTANEOUS | Status: DC
  Administered 2022-08-11 – 2022-08-13 (×3): 15 [IU] via SUBCUTANEOUS
  Filled 2022-08-08 (×4): qty 0.15

## 2022-08-08 MED ORDER — DEXTROSE 50 % IV SOLN
25.0000 mL | Freq: Once | INTRAVENOUS | Status: AC
  Administered 2022-08-08: 25 mL via INTRAVENOUS
  Filled 2022-08-08: qty 50

## 2022-08-08 MED ORDER — INSULIN ASPART 100 UNIT/ML IJ SOLN
10.0000 [IU] | Freq: Once | INTRAMUSCULAR | Status: AC
  Administered 2022-08-08: 10 [IU] via INTRAVENOUS

## 2022-08-08 MED ORDER — STERILE WATER FOR INJECTION IV SOLN
INTRAVENOUS | Status: AC
  Filled 2022-08-08 (×2): qty 1000

## 2022-08-08 MED ORDER — SODIUM CHLORIDE 0.9 % IV BOLUS
1000.0000 mL | Freq: Once | INTRAVENOUS | Status: AC
  Administered 2022-08-08: 1000 mL via INTRAVENOUS

## 2022-08-08 NOTE — H&P (View-Only) (Signed)
     [facility] Progress Note  CC:  Black stool, anemia   Subjective:  No BM since about 7 PM last night, it was dark but not as dark/black as it had been at home.  Objective:  Vital signs in last 24 hours: Temp:  [97 F (36.1 C)-97.9 F (36.6 C)] 97.5 F (36.4 C) (03/16 0807) Pulse Rate:  [53-91] 65 (03/16 0807) Resp:  [12-20] 18 (03/16 0807) BP: (106-158)/(42-97) 140/50 (03/16 0807) SpO2:  [95 %-100 %] 98 % (03/16 0807) Weight:  [89.8 kg-90 kg] 90 kg (03/16 0500) Last BM Date : 08/07/22 General:  Alert, Well-developed, in NAD Heart:  Regular rate and rhythm; no murmurs Pulm:  CTAB.  No W/R/R. Abdomen:  Soft, non-distended.  BS present.  Non-tender. Extremities:  Without edema. Neurologic:  Alert and oriented x 4;  grossly normal neurologically. Psych:  Alert and cooperative. Normal mood and affect.  Intake/Output from previous day: 03/15 0701 - 03/16 0700 In: 360 [P.O.:360] Out: -   Lab Results: Recent Labs    08/07/22 1238 08/07/22 2025 08/08/22 0218  WBC 7.2 3.9* 4.0  HGB 8.7* 7.4* 7.0*  HCT 27.6* 23.9* 22.0*  PLT 88* 72* 66*   BMET Recent Labs    08/07/22 2025 08/08/22 0218 08/08/22 0633  NA 142 140 142  K 6.4* 5.8* 6.0*  CL 117* 118* 118*  CO2 18* 18* 18*  GLUCOSE 200* 99 108*  BUN 44* 42* 40*  CREATININE 2.63* 2.53* 2.61*  CALCIUM 9.1 8.8* 9.0   LFT Recent Labs    08/08/22 0218  PROT 5.0*  ALBUMIN 2.8*  AST 23  ALT 25  ALKPHOS 78  BILITOT 0.6   Assessment / Plan: *Anemia normal with hemoglobin of 8.7 g-->7.4 g-->7.0 g this AM.  Down from 12.4 g about 3 months ago.  Reports black stools and was Hemoccult positive.  Has been using some ibuprofen for back pain. *Chronic kidney disease stage IIIb *Chronic anticoagulation with Eliquis due to atrial fibrillation: Last dose 3/13 evening. *History of colon polyps: Had several tubular adenomas removed on colonoscopy in 2016. *Back pain due to fractures from a fall in  October *Hyperkalemia:  K+ is 6.0 this AM  -Pantoprazole to 40 mg BID for now. -Monitor Hgb and transfuse prn. -EGD to start on 3/17.  We were going to try to attempt today but K+ is too high for anesthesia.  [place] for full liquids today then NPO after midnight.     LOS: 0 days   Laban Emperor. Harsimran Westman  08/08/2022, 9:35 AM

## 2022-08-08 NOTE — Progress Notes (Addendum)
PROGRESS NOTE    Dean Murphy  [id number] DOB: [birth year] DOA: 08/07/2022 PCP: Tonia Ghent, MD     Brief Narrative:   History of IDDM2, HLD, PVD status post bilateral CEA,CAD status post CABG,   A-fib, complete heart block status post pacemaker placement in 2016, History of CVA on eliquis, h/o dCHF on Demadex, chronic thrombocytopenia Presented with dark stools, anemia, also has hypokalemia  Subjective:  potassium persistently elevated around 6 despite patiromer/ bicarb /insulin History of Lokelma intolerance  Hgb /plt down trending, no more dark stool since admitted per RN  He denies discomfort, sitting up at the edge of the bed eating breakfast, is no plan for EGD today  Family at bedside  Assessment & Plan:  Principal Problem:   GI bleed Active Problems:   Paroxysmal atrial fibrillation ([place])   DM (diabetes mellitus), type 2 with renal complications ([place])   Dyslipidemia   Essential hypertension   GERD   Chronic kidney disease, stage III (moderate) (HCC)   PVD- s/p bilat CEA- CA dopplers OK Jan 2015   Complete heart block (HCC)   S/P CABG x 5 1989. Low risk Myoview 2012   History of CVA (cerebrovascular accident)   Coronary artery disease   Chronic diastolic heart failure (HCC)    Assessment and Plan:  GI bleed with melena/acute blood loss anemia -IV PPI -Supportive transfusion if hgb less  than 7 -Will follow GI recommendation  Acute on chronic thrombocytopenia Acute drop of platelet likely due to consumption Due to active bleeding May need platelet transfusion  Hyperkalemia/metabolic acidosis -potassium persistently elevated around 6 despite patiromer/ bicarb /insulin, no significant EKG changes -History of Lokelma intolerance -Hold losartan -Will give hydration, sodium bicarb push x 1, D50 insulin x 1, start sodium bicarb drip -Repeat bmp   IDDM2 blood glucose low normal, hold long-acting insulin today, continue SSI     PVD status post  bilateral CEA, continue statin, avoid antiplatelet and anticoagulation due to active bleeding CAD status post CABG, no chest pain    A-fib, complete heart block status post pacemaker placement in 2016, hold Eliquis in the setting of GI bleed, continue Toprol XL h/o dCHF , home meds Demadex held, appear euvolemic History of CVA , continue statin, hold Eliquis     I have Reviewed nursing notes, Vitals, pain scores, I/o's, Lab results and  imaging results since pt's last encounter, details please see discussion above  I ordered the following labs:  Unresulted Labs (From admission, onward)     Start     Ordered   08/08/22 0919  CBC  5A & 5P,   R      08/08/22 0918   08/08/22 [id number]  Basic metabolic panel  5A & 5P,   R      08/08/22 0918             DVT prophylaxis: scd's   Code Status:   Code Status: Full Code  Family Communication: Family at bedside Disposition:   Dispo: The patient is from: Home              Anticipated d/c is to: Home              Anticipated d/c date is: TBD, > 24hrs, needs EGD, monitor k/hgb/plt  Antimicrobials:    Anti-infectives (From admission, onward)    None          Objective: Vitals:   08/08/22 0428 08/08/22 0500 08/08/22 0509 08/08/22 0807  BP: [name])  129/44  122/62 (!) 140/50  Pulse: 63   65  Resp: 18   18  Temp: 97.7 F (36.5 C)   (!) 97.5 F (36.4 C)  TempSrc: Oral   Oral  SpO2: 96%   98%  Weight:  90 kg    Height:        Intake/Output Summary (Last 24 hours) at 08/08/2022 1003 Last data filed at 08/07/2022 2053 Gross per 24 hour  Intake 360 ml  Output --  Net 360 ml   Filed Weights   08/07/22 1734 08/08/22 0500  Weight: 89.8 kg 90 kg    Examination:  General exam: alert, awake, communicative,calm, NAD, very pleasant Respiratory system: Clear to auscultation. Respiratory effort normal. Cardiovascular system:  RRR.  Gastrointestinal system: Abdomen is nondistended, soft and nontender.  Normal bowel sounds  heard. Central nervous system: Alert and oriented. No focal neurological deficits. Extremities:  mild left ankle edema, reports chronic  Skin: No rashes, lesions or ulcers Psychiatry: Judgement and insight appear normal. Mood & affect appropriate.     Data Reviewed: I have personally reviewed  labs and visualized  imaging studies since the last encounter and formulate the plan        Scheduled Meds:  dextrose  25 mL Intravenous Once   insulin aspart  0-9 Units Subcutaneous TID WC   insulin aspart  10 Units Intravenous Once   [START ON 08/09/2022] insulin glargine-yfgn  15 Units Subcutaneous Daily   metoprolol succinate  25 mg Oral QHS   pantoprazole (PROTONIX) IV  40 mg Intravenous Q12H   rosuvastatin  20 mg Oral Daily   sodium bicarbonate  50 mEq Intravenous Once   sodium chloride flush  3 mL Intravenous Q12H   Continuous Infusions:  sodium bicarbonate 150 mEq in sterile water 1,150 mL infusion     sodium chloride       LOS: 0 days   Time spent:  32mins  Florencia Reasons, MD PhD FACP [facility]  Available via Epic secure chat 7am-7pm for nonurgent issues Please page for urgent issues To page the attending provider between 7A-7P or the covering provider during after hours 7P-7A, please log into the web site [email or web address] and access using universal [place] password for that web site. If you do not have the password, please call the hospital operator.    08/08/2022, 10:03 AM

## 2022-08-08 NOTE — Progress Notes (Signed)
    Patient Name: Dean Murphy           DOB: 06/28/1926  MRN: [id number]      Admission Date: 08/07/2022  Attending Provider: Florencia Reasons, MD  Primary Diagnosis: GI bleed   Level of care: Telemetry Medical    CROSS COVER NOTE   Date of Service   08/08/2022   Dean Murphy, 89 y.o. male, was admitted on 08/07/2022 for GI bleed.    HPI/Events of Note   2200-RN reports critical lab, potassium 6.4.  Orders placed for IV insulin and D50.  RN aware labs will need to be rechecked early this morning.  0600- stat BMP and EKG ordered for hyperkalemia.  No acute changes reported by RN.  Lab pending.   Interventions/ Plan   Above        Raenette Rover, DNP, [place]

## 2022-08-08 NOTE — Progress Notes (Signed)
     [facility] Progress Note  CC:  Black stool, anemia   Subjective:  No BM since about 7 PM last night, it was dark but not as dark/black as it had been at home.  Objective:  Vital signs in last 24 hours: Temp:  [97 F (36.1 C)-97.9 F (36.6 C)] 97.5 F (36.4 C) (03/16 0807) Pulse Rate:  [53-91] 65 (03/16 0807) Resp:  [12-20] 18 (03/16 0807) BP: (106-158)/(42-97) 140/50 (03/16 0807) SpO2:  [95 %-100 %] 98 % (03/16 0807) Weight:  [89.8 kg-90 kg] 90 kg (03/16 0500) Last BM Date : 08/07/22 General:  Alert, Well-developed, in NAD Heart:  Regular rate and rhythm; no murmurs Pulm:  CTAB.  No W/R/R. Abdomen:  Soft, non-distended.  BS present.  Non-tender. Extremities:  Without edema. Neurologic:  Alert and oriented x 4;  grossly normal neurologically. Psych:  Alert and cooperative. Normal mood and affect.  Intake/Output from previous day: 03/15 0701 - 03/16 0700 In: 360 [P.O.:360] Out: -   Lab Results: Recent Labs    08/07/22 1238 08/07/22 2025 08/08/22 0218  WBC 7.2 3.9* 4.0  HGB 8.7* 7.4* 7.0*  HCT 27.6* 23.9* 22.0*  PLT 88* 72* 66*   BMET Recent Labs    08/07/22 2025 08/08/22 0218 08/08/22 0633  NA 142 140 142  K 6.4* 5.8* 6.0*  CL 117* 118* 118*  CO2 18* 18* 18*  GLUCOSE 200* 99 108*  BUN 44* 42* 40*  CREATININE 2.63* 2.53* 2.61*  CALCIUM 9.1 8.8* 9.0   LFT Recent Labs    08/08/22 0218  PROT 5.0*  ALBUMIN 2.8*  AST 23  ALT 25  ALKPHOS 78  BILITOT 0.6   Assessment / Plan: *Anemia normal with hemoglobin of 8.7 g-->7.4 g-->7.0 g this AM.  Down from 12.4 g about 3 months ago.  Reports black stools and was Hemoccult positive.  Has been using some ibuprofen for back pain. *Chronic kidney disease stage IIIb *Chronic anticoagulation with Eliquis due to atrial fibrillation: Last dose 3/13 evening. *History of colon polyps: Had several tubular adenomas removed on colonoscopy in 2016. *Back pain due to fractures from a fall in  October *Hyperkalemia:  K+ is 6.0 this AM  -Pantoprazole to 40 mg BID for now. -Monitor Hgb and transfuse prn. -EGD to start on 3/17.  We were going to try to attempt today but K+ is too high for anesthesia.  [place] for full liquids today then NPO after midnight.     LOS: 0 days   Laban Emperor. Daphnie Venturini  08/08/2022, 9:35 AM

## 2022-08-09 ENCOUNTER — Inpatient Hospital Stay (HOSPITAL_COMMUNITY): Payer: Medicare Other | Admitting: Anesthesiology

## 2022-08-09 ENCOUNTER — Encounter: Payer: Self-pay | Admitting: Family Medicine

## 2022-08-09 ENCOUNTER — Encounter (HOSPITAL_COMMUNITY)
Admission: EM | Disposition: A | Discharge status: Home / Self Care | Payer: Self-pay | Source: Ambulatory Visit | Attending: Internal Medicine

## 2022-08-09 DIAGNOSIS — R195 Other fecal abnormalities: Secondary | ICD-10-CM | POA: Diagnosis not present

## 2022-08-09 DIAGNOSIS — E1122 Type 2 diabetes mellitus with diabetic chronic kidney disease: Secondary | ICD-10-CM

## 2022-08-09 DIAGNOSIS — K317 Polyp of stomach and duodenum: Secondary | ICD-10-CM | POA: Diagnosis not present

## 2022-08-09 DIAGNOSIS — D500 Iron deficiency anemia secondary to blood loss (chronic): Secondary | ICD-10-CM | POA: Diagnosis not present

## 2022-08-09 DIAGNOSIS — K449 Diaphragmatic hernia without obstruction or gangrene: Secondary | ICD-10-CM

## 2022-08-09 DIAGNOSIS — I130 Hypertensive heart and chronic kidney disease with heart failure and stage 1 through stage 4 chronic kidney disease, or unspecified chronic kidney disease: Secondary | ICD-10-CM

## 2022-08-09 DIAGNOSIS — K921 Melena: Secondary | ICD-10-CM | POA: Diagnosis not present

## 2022-08-09 DIAGNOSIS — E875 Hyperkalemia: Secondary | ICD-10-CM | POA: Diagnosis not present

## 2022-08-09 DIAGNOSIS — D631 Anemia in chronic kidney disease: Secondary | ICD-10-CM

## 2022-08-09 DIAGNOSIS — I2510 Atherosclerotic heart disease of native coronary artery without angina pectoris: Secondary | ICD-10-CM

## 2022-08-09 DIAGNOSIS — I480 Paroxysmal atrial fibrillation: Secondary | ICD-10-CM | POA: Diagnosis not present

## 2022-08-09 DIAGNOSIS — I509 Heart failure, unspecified: Secondary | ICD-10-CM

## 2022-08-09 DIAGNOSIS — E872 Acidosis, unspecified: Secondary | ICD-10-CM | POA: Diagnosis not present

## 2022-08-09 DIAGNOSIS — N189 Chronic kidney disease, unspecified: Secondary | ICD-10-CM

## 2022-08-09 HISTORY — PX: ESOPHAGOGASTRODUODENOSCOPY: SHX5428

## 2022-08-09 LAB — GLUCOSE, CAPILLARY
Glucose-Capillary: 126 mg/dL — ABNORMAL HIGH (ref 70–99)
Glucose-Capillary: 147 mg/dL — ABNORMAL HIGH (ref 70–99)
Glucose-Capillary: 139 mg/dL — ABNORMAL HIGH (ref 70–99)
Glucose-Capillary: 127 mg/dL — ABNORMAL HIGH (ref 70–99)
Glucose-Capillary: 215 mg/dL — ABNORMAL HIGH (ref 70–99)

## 2022-08-09 LAB — BASIC METABOLIC PANEL
Anion gap: 7 (ref 5–15)
Anion gap: 7 (ref 5–15)
Anion gap: 13 (ref 5–15)
BUN: 35 mg/dL — ABNORMAL HIGH (ref 8–23)
BUN: 33 mg/dL — ABNORMAL HIGH (ref 8–23)
BUN: 32 mg/dL — ABNORMAL HIGH (ref 8–23)
CO2: 20 mmol/L — ABNORMAL LOW (ref 22–32)
CO2: 23 mmol/L (ref 22–32)
CO2: 21 mmol/L — ABNORMAL LOW (ref 22–32)
Calcium: 9.1 mg/dL (ref 8.9–10.3)
Calcium: 8.6 mg/dL — ABNORMAL LOW (ref 8.9–10.3)
Calcium: 9.4 mg/dL (ref 8.9–10.3)
Chloride: 113 mmol/L — ABNORMAL HIGH (ref 98–111)
Chloride: 110 mmol/L (ref 98–111)
Chloride: 107 mmol/L (ref 98–111)
Creatinine, Ser: 2.42 mg/dL — ABNORMAL HIGH (ref 0.61–1.24)
Creatinine, Ser: 2.37 mg/dL — ABNORMAL HIGH (ref 0.61–1.24)
Creatinine, Ser: 2.48 mg/dL — ABNORMAL HIGH (ref 0.61–1.24)
GFR, Estimated: 24 mL/min — ABNORMAL LOW (ref >60)
GFR, Estimated: 25 mL/min — ABNORMAL LOW (ref >60)
GFR, Estimated: 24 mL/min — ABNORMAL LOW (ref >60)
Glucose, Bld: 152 mg/dL — ABNORMAL HIGH (ref 70–99)
Glucose, Bld: 266 mg/dL — ABNORMAL HIGH (ref 70–99)
Glucose, Bld: 147 mg/dL — ABNORMAL HIGH (ref 70–99)
Potassium: 5.5 mmol/L — ABNORMAL HIGH (ref 3.5–5.1)
Potassium: 5.2 mmol/L — ABNORMAL HIGH (ref 3.5–5.1)
Potassium: 4.8 mmol/L (ref 3.5–5.1)
Sodium: 140 mmol/L (ref 135–145)
Sodium: 140 mmol/L (ref 135–145)
Sodium: 141 mmol/L (ref 135–145)

## 2022-08-09 LAB — CBC
HCT: 23.1 % — ABNORMAL LOW (ref 39.0–52.0)
HCT: 21.7 % — ABNORMAL LOW (ref 39.0–52.0)
HCT: 26.1 % — ABNORMAL LOW (ref 39.0–52.0)
Hemoglobin: 7.5 g/dL — ABNORMAL LOW (ref 13.0–17.0)
Hemoglobin: 7.3 g/dL — ABNORMAL LOW (ref 13.0–17.0)
Hemoglobin: 8.6 g/dL — ABNORMAL LOW (ref 13.0–17.0)
MCH: 31.5 pg (ref 26.0–34.0)
MCH: 32.2 pg (ref 26.0–34.0)
MCH: 31.9 pg (ref 26.0–34.0)
MCHC: 32.5 g/dL (ref 30.0–36.0)
MCHC: 33.6 g/dL (ref 30.0–36.0)
MCHC: 33 g/dL (ref 30.0–36.0)
MCV: 97.1 fL (ref 80.0–100.0)
MCV: 95.6 fL (ref 80.0–100.0)
MCV: 96.7 fL (ref 80.0–100.0)
Platelets: 70 10*3/uL — ABNORMAL LOW (ref 150–400)
Platelets: 63 10*3/uL — ABNORMAL LOW (ref 150–400)
Platelets: 78 10*3/uL — ABNORMAL LOW (ref 150–400)
RBC: 2.38 MIL/uL — ABNORMAL LOW (ref 4.22–5.81)
RBC: 2.27 MIL/uL — ABNORMAL LOW (ref 4.22–5.81)
RBC: 2.7 MIL/uL — ABNORMAL LOW (ref 4.22–5.81)
RDW: 15.1 % (ref 11.5–15.5)
RDW: 14.9 % (ref 11.5–15.5)
RDW: 14.8 % (ref 11.5–15.5)
WBC: 3.4 10*3/uL — ABNORMAL LOW (ref 4.0–10.5)
WBC: 2.9 10*3/uL — ABNORMAL LOW (ref 4.0–10.5)
WBC: 5 10*3/uL (ref 4.0–10.5)
nRBC: 0 % (ref 0.0–0.2)
nRBC: 0 % (ref 0.0–0.2)
nRBC: 0 % (ref 0.0–0.2)

## 2022-08-09 SURGERY — EGD (ESOPHAGOGASTRODUODENOSCOPY)
Anesthesia: Monitor Anesthesia Care

## 2022-08-09 MED ORDER — LIDOCAINE 5 % EX PTCH
1.0000 | MEDICATED_PATCH | CUTANEOUS | Status: DC
  Administered 2022-08-09 – 2022-08-12 (×4): 1 via TRANSDERMAL
  Filled 2022-08-09 (×5): qty 1

## 2022-08-09 MED ORDER — PEG-KCL-NACL-NASULF-NA ASC-C 100 G PO SOLR
0.5000 | Freq: Once | ORAL | Status: DC
  Filled 2022-08-09: qty 1

## 2022-08-09 MED ORDER — SODIUM BICARBONATE 650 MG PO TABS
650.0000 mg | ORAL_TABLET | Freq: Two times a day (BID) | ORAL | Status: DC
  Administered 2022-08-09 – 2022-08-13 (×8): 650 mg via ORAL
  Filled 2022-08-09 (×8): qty 1

## 2022-08-09 MED ORDER — PEG-KCL-NACL-NASULF-NA ASC-C 100 G PO SOLR
0.5000 | Freq: Once | ORAL | Status: AC
  Administered 2022-08-09: 100 g via ORAL
  Filled 2022-08-09: qty 1

## 2022-08-09 MED ORDER — PROPOFOL 10 MG/ML IV BOLUS
INTRAVENOUS | Status: DC | PRN
  Administered 2022-08-09: 20 mg via INTRAVENOUS

## 2022-08-09 MED ORDER — PEG-KCL-NACL-NASULF-NA ASC-C 100 G PO SOLR: 1.0000 | Freq: Once | ORAL | Status: DC

## 2022-08-09 MED ORDER — LIDOCAINE 2% (20 MG/ML) 5 ML SYRINGE
INTRAMUSCULAR | Status: DC | PRN
  Administered 2022-08-09: 40 mg via INTRAVENOUS

## 2022-08-09 MED ORDER — PROPOFOL 500 MG/50ML IV EMUL
INTRAVENOUS | Status: DC | PRN
  Administered 2022-08-09: 180 ug/kg/min via INTRAVENOUS

## 2022-08-09 MED ORDER — SODIUM CHLORIDE 0.9 % IV SOLN: INTRAVENOUS | Status: DC

## 2022-08-09 MED ORDER — SODIUM BICARBONATE 8.4 % IV SOLN
50.0000 meq | Freq: Once | INTRAVENOUS | Status: AC
  Administered 2022-08-09: 50 meq via INTRAVENOUS
  Filled 2022-08-09: qty 50

## 2022-08-09 MED ORDER — INSULIN ASPART 100 UNIT/ML IJ SOLN
10.0000 [IU] | Freq: Once | INTRAMUSCULAR | Status: AC
  Administered 2022-08-09: 10 [IU] via SUBCUTANEOUS

## 2022-08-09 MED ORDER — DEXTROSE 50 % IV SOLN
25.0000 mL | Freq: Once | INTRAVENOUS | Status: AC
  Administered 2022-08-09: 25 mL via INTRAVENOUS
  Filled 2022-08-09: qty 50

## 2022-08-09 NOTE — Transfer of Care (Signed)
Immediate Anesthesia Transfer of Care Note  Patient: Dean Murphy  Procedure(s) Performed: ESOPHAGOGASTRODUODENOSCOPY (EGD)  Patient Location: PACU  Anesthesia Type:MAC  Level of Consciousness: sedated  Airway & Oxygen Therapy: Patient Spontanous Breathing and Patient connected to nasal cannula oxygen  Post-op Assessment: Report given to RN and Post -op Vital signs reviewed and stable  Post vital signs: Reviewed and stable  Last Vitals:  Vitals Value Taken Time  BP 117/42 08/09/22 1152  Temp 36.7 C 08/09/22 1146  Pulse 54 08/09/22 1153  Resp 15 08/09/22 1153  SpO2 93 % 08/09/22 1153  Vitals shown include unvalidated device data.  Last Pain:  Vitals:   08/09/22 1032  TempSrc: Temporal  PainSc: 0-No pain         Complications: No notable events documented.

## 2022-08-09 NOTE — Progress Notes (Signed)
PROGRESS NOTE    Dean Murphy  [id number] DOB: [birth year] DOA: 08/07/2022 PCP: Tonia Ghent, MD     Brief Narrative:   History of IDDM2, HLD, PVD status post bilateral CEA,CAD status post CABG,   A-fib, complete heart block status post pacemaker placement in 2016, History of CVA on eliquis, h/o dCHF on Demadex, chronic thrombocytopenia Presented with dark stools, anemia, also has hypokalemia  Subjective:  Last bm was brown, k down to 5.5  Hgb 7.5 this am, plt 70  He denies discomfort,  EGD today  Reports back pain since fall in 12/2021  Family at bedside  Assessment & Plan:  Principal Problem:   GI bleed Active Problems:   Paroxysmal atrial fibrillation ([facility])   DM (diabetes mellitus), type 2 with renal complications ([facility])   Dyslipidemia   Essential hypertension   GERD   Chronic kidney disease, stage III (moderate) (HCC)   PVD- s/p bilat CEA- CA dopplers OK Jan 2015   Complete heart block (HCC)   S/P CABG x 5 1989. Low risk Myoview 2012   History of CVA (cerebrovascular accident)   Coronary artery disease   Chronic diastolic heart failure (HCC)    Assessment and Plan:  GI bleed with melena/acute blood loss anemia -IV PPI -Supportive transfusion if hgb less  than 7 -Will follow GI recommendation  Acute on chronic thrombocytopenia Acute drop of platelet likely due to consumption Due to active bleeding May need platelet transfusion if plt drop less than 50 in the setting of gi bleed  Hyperkalemia/metabolic acidosis -K 6.4 on presentation -History of Lokelma intolerance -Hold losartan -now corrected  -Repeat bmp   IDDM2 A1c 6.3 deceased long-acting insulin dose due to npo status for gi procedure,  continue SSI, adjust prn     PVD status post bilateral CEA, History of CVA ,continue statin, avoid antiplatelet and anticoagulation due to active bleeding CAD status post CABG, no chest pain    A-fib, complete heart block status post pacemaker placement  in 2016, hold Eliquis in the setting of GI bleed, continue Toprol XL  dCHF , home meds Demadex held, appear euvolemic  low back pain after a fall in 12/2021 L1 and L2 compression fractures with mild height loss seen on CT 12/2021  Repeat lumbar x ray by pcp on 3/14  topical lidocaine patch provided  F/u with pcp and neurosurgery outpatient  I have Reviewed nursing notes, Vitals, pain scores, I/o's, Lab results and  imaging results since pt's last encounter, details please see discussion above  I ordered the following labs:  Unresulted Labs (From admission, onward)     Start     Ordered   08/09/22 0909  CBC  5A & 5P,   R (with TIMED occurrences)      08/09/22 0908   08/09/22 0000000  Basic metabolic panel  5A & 5P,   R (with TIMED occurrences)      08/09/22 0908             DVT prophylaxis: Place and maintain sequential compression device Start: 08/08/22 1005scd's   Code Status:   Code Status: Full Code  Family Communication: Family at bedside Disposition:   Dispo: The patient is from: Home              Anticipated d/c is to: Home              Anticipated d/c date is: TBD > 24hrs, colonoscopy on 3/18  Antimicrobials:    Anti-infectives (From  admission, onward)    None          Objective: Vitals:   08/08/22 2026 08/09/22 0453 08/09/22 0500 08/09/22 0740  BP: (!) 155/62 (!) 150/60  (!) 145/58  Pulse: 78 (!) 58  68  Resp: 17 18  17   Temp: 97.6 F (36.4 C) 97.9 F (36.6 C)  97.7 F (36.5 C)  TempSrc: Oral Oral  Oral  SpO2: 99% 99%  98%  Weight:   88.2 kg   Height:        Intake/Output Summary (Last 24 hours) at 08/09/2022 0909 Last data filed at 08/09/2022 0742 Gross per 24 hour  Intake 2428.06 ml  Output 2500 ml  Net -71.94 ml   Filed Weights   08/07/22 1734 08/08/22 0500 08/09/22 0500  Weight: 89.8 kg 90 kg 88.2 kg    Examination:  General exam:  NAD, very pleasant Respiratory system: Clear to auscultation. Respiratory effort  normal. Cardiovascular system:  RRR.  Gastrointestinal system: Abdomen is nondistended, soft and nontender.  Normal bowel sounds heard. Central nervous system: Alert and oriented. No focal neurological deficits. Extremities:  mild left ankle edema, reports chronic  Skin: No rashes, lesions or ulcers Psychiatry: Judgement and insight appear normal. Mood & affect appropriate.     Data Reviewed: I have personally reviewed  labs and visualized  imaging studies since the last encounter and formulate the plan        Scheduled Meds:  dextrose  25 mL Intravenous Once   insulin aspart  0-9 Units Subcutaneous TID WC   insulin aspart  10 Units Subcutaneous Once   [START ON 08/10/2022] insulin glargine-yfgn  15 Units Subcutaneous Daily   lubiprostone  8 mcg Oral Q breakfast   metoprolol succinate  25 mg Oral QHS   pantoprazole (PROTONIX) IV  40 mg Intravenous Q12H   rosuvastatin  20 mg Oral Daily   sodium bicarbonate  50 mEq Intravenous Once   sodium chloride flush  3 mL Intravenous Q12H   Continuous Infusions:  sodium chloride 20 mL/hr at 08/09/22 0608   sodium bicarbonate 150 mEq in sterile water 1,150 mL infusion 50 mL/hr at 08/09/22 0814     LOS: 1 day   Time spent:  58mins  Florencia Reasons, MD PhD FACP [facility]  Available via Epic secure chat 7am-7pm for nonurgent issues Please page for urgent issues To page the attending provider between 7A-7P or the covering provider during after hours 7P-7A, please log into the web site [email or web address] and access using universal [place] password for that web site. If you do not have the password, please call the hospital operator.    08/09/2022, 9:09 AM

## 2022-08-09 NOTE — Anesthesia Preprocedure Evaluation (Addendum)
Anesthesia Evaluation  Patient identified by MRN, date of birth, ID band Patient awake    Reviewed: Allergy & Precautions, NPO status , Patient's Chart, lab work & pertinent test results  History of Anesthesia Complications (+) PONV and history of anesthetic complications  Airway Mallampati: III  TM Distance: >3 FB Neck ROM: Full    Dental  (+) Dental Advisory Given, Chipped,    Pulmonary neg shortness of breath, neg sleep apnea, neg COPD, neg recent URI, former smoker   breath sounds clear to auscultation       Cardiovascular hypertension, Pt. on medications and Pt. on home beta blockers + CAD, + Past MI, + Peripheral Vascular Disease and +CHF  + dysrhythmias + pacemaker  Rhythm:Regular  Left ventricle: The cavity size was normal. There was moderate    concentric hypertrophy. Systolic function was normal. The    estimated ejection fraction was in the range of 55% to 60%.    Hypokinesis of the apical septal myocardium. Doppler parameters    are consistent with abnormal left ventricular relaxation (grade 1    diastolic dysfunction). Doppler parameters are consistent with    indetermiante ventricular filling pressure.  - Aortic valve: Transvalvular velocity was within the normal range.    There was no stenosis. There was no regurgitation.  - Mitral valve: Transvalvular velocity was within the normal range.    There was no evidence for stenosis. There was mild regurgitation.  - Left atrium: The atrium was severely dilated.  - Right ventricle: The cavity size was normal. Wall thickness was    normal. Systolic function was normal.  - Atrial septum: No defect or patent foramen ovale was identified    by color flow Doppler.  - Tricuspid valve: There was trivial regurgitation.  - Pulmonary arteries: Systolic pressure was mildly increased. PA    peak pressure: 41 mm Hg (S).     Neuro/Psych    GI/Hepatic PUD,GERD  ,,  Endo/Other   diabetes, Insulin Dependent    Renal/GU CRFRenal diseaseLab Results      Component                Value               Date                      CREATININE               2.37 (H)            08/09/2022                Musculoskeletal  (+) Arthritis ,    Abdominal   Peds  Hematology  (+) Blood dyscrasia, anemia Lab Results      Component                Value               Date                      WBC                      2.9 (L)             08/09/2022                HGB  7.3 (L)             08/09/2022                HCT                      21.7 (L)            08/09/2022                MCV                      95.6                08/09/2022                PLT                      63 (L)              08/09/2022              Anesthesia Other Findings   Reproductive/Obstetrics                             Anesthesia Physical Anesthesia Plan  ASA: 3  Anesthesia Plan: MAC   Post-op Pain Management: Minimal or no pain anticipated   Induction: Intravenous  PONV Risk Score and Plan: 2 and Propofol infusion  Airway Management Planned: Nasal Cannula and Natural Airway  Additional Equipment: None  Intra-op Plan:   Post-operative Plan:   Informed Consent: I have reviewed the patients History and Physical, chart, labs and discussed the procedure including the risks, benefits and alternatives for the proposed anesthesia with the patient or authorized representative who has indicated his/her understanding and acceptance.     Dental advisory given  Plan Discussed with: CRNA  Anesthesia Plan Comments:        Anesthesia Quick Evaluation

## 2022-08-09 NOTE — Plan of Care (Signed)
  Problem: Tissue Perfusion: Goal: Adequacy of tissue perfusion will improve Outcome: Not Progressing   Problem: Clinical Measurements: Goal: Diagnostic test results will improve Outcome: Not Progressing

## 2022-08-09 NOTE — Plan of Care (Signed)

## 2022-08-09 NOTE — Op Note (Signed)
[facility] Patient Name: Dean Murphy Procedure Date : 08/09/2022 MRN: [id number] Attending MD: Estill Cotta. Loletha Carrow , MD, [id number] Date of Birth: Dec 14, 1926 CSN: [id number] Age: 89 Admit Type: Inpatient Procedure:                Upper GI endoscopy Indications:              Iron deficiency anemia secondary to chronic blood                            loss, Heme positive stool, Melena Providers:                Vladimir Crofts, RN, Darliss Cheney, Technician, Estill Cotta.                            Loletha Carrow, MD Referring MD:             [facility] Medicines:                Monitored Anesthesia Care Complications:            No immediate complications. Estimated Blood Loss:     Estimated blood loss: none. Procedure:                Pre-Anesthesia Assessment:                           - Prior to the procedure, a History and Physical                            was performed, and patient medications and                            allergies were reviewed. The patient's tolerance of                            previous anesthesia was also reviewed. The risks                            and benefits of the procedure and the sedation                            options and risks were discussed with the patient.                            All questions were answered, and informed consent                            was obtained. Prior Anticoagulants: The patient has                            taken Eliquis (apixaban), last dose was 3 days                            prior to procedure. ASA Grade Assessment: III - A  patient with severe systemic disease. After                            reviewing the risks and benefits, the patient was                            deemed in satisfactory condition to undergo the                            procedure.                           After obtaining informed consent, the endoscope was                            passed under direct  vision. Throughout the                            procedure, the patient's blood pressure, pulse, and                            oxygen saturations were monitored continuously. The                            [id number]) Olympus endoscope was introduced                            through the mouth, and advanced to the second part                            of duodenum. The upper GI endoscopy was                            accomplished without difficulty. The patient                            tolerated the procedure well. Scope In: Scope Out: Findings:      The esophagus was normal other than a small sliding hiatal hernia.      A single small hyperplastic-appearing sessile polyp was found on the       anterior wall of the gastric body.      The exam of the stomach was otherwise normal.      The cardia and gastric fundus were normal on retroflexion.      The examined duodenum was normal.      There was no fresh or old blood seen in the upper GI tract. Impression:               - Normal esophagus except for small sliding hiatal                            hernia.                           - A single gastric polyp.                           -  Normal examined duodenum.                           - No specimens collected. Recommendation:           - Return patient to hospital ward for ongoing care.                           - Clear liquid diet.                           - Perform a colonoscopy tomorrow. Procedure Code(s):        --- Professional ---                           [phone], Esophagogastroduodenoscopy, flexible,                            transoral; diagnostic, including collection of                            specimen(s) by brushing or washing, when performed                            (separate procedure) Diagnosis Code(s):        --- Professional ---                           K31.7, Polyp of stomach and duodenum                           D50.0, Iron deficiency anemia  secondary to blood                            loss (chronic)                           R19.5, Other fecal abnormalities                           K92.1, Melena (includes Hematochezia) CPT copyright 2022 American Medical Association. All rights reserved. The codes documented in this report are preliminary and upon coder review may  be revised to meet current compliance requirements. Caroll Weinheimer L. Loletha Carrow, MD 08/09/2022 11:48:46 AM This report has been signed electronically. Number of Addenda: 0

## 2022-08-09 NOTE — Interval H&P Note (Signed)
History and Physical Interval Note:  08/09/2022 11:25 AM  Dean Murphy  has presented today for surgery, with the diagnosis of Melena, Anemia, Dark stools, chronic anticoagulation.  The various methods of treatment have been discussed with the patient and family. After consideration of risks, benefits and other options for treatment, the patient has consented to  Procedure(s): ESOPHAGOGASTRODUODENOSCOPY (EGD) (N/A) as a surgical intervention.  The patient's history has been reviewed, patient examined, no change in status, stable for surgery.  I have reviewed the patient's chart and labs.  Questions were answered to the patient's satisfaction.    Hgb 7.3 today  [name]

## 2022-08-10 ENCOUNTER — Encounter (HOSPITAL_COMMUNITY): Payer: Self-pay | Admitting: Internal Medicine

## 2022-08-10 ENCOUNTER — Encounter (HOSPITAL_COMMUNITY)
Admission: EM | Disposition: A | Discharge status: Home / Self Care | Payer: Self-pay | Source: Ambulatory Visit | Attending: Internal Medicine

## 2022-08-10 ENCOUNTER — Inpatient Hospital Stay (HOSPITAL_COMMUNITY): Payer: Medicare Other | Admitting: Anesthesiology

## 2022-08-10 ENCOUNTER — Ambulatory Visit: Payer: Medicare Other | Admitting: Gastroenterology

## 2022-08-10 ENCOUNTER — Inpatient Hospital Stay (HOSPITAL_COMMUNITY): Payer: Medicare Other

## 2022-08-10 DIAGNOSIS — D126 Benign neoplasm of colon, unspecified: Secondary | ICD-10-CM | POA: Diagnosis not present

## 2022-08-10 DIAGNOSIS — D125 Benign neoplasm of sigmoid colon: Secondary | ICD-10-CM

## 2022-08-10 DIAGNOSIS — K633 Ulcer of intestine: Secondary | ICD-10-CM

## 2022-08-10 DIAGNOSIS — D122 Benign neoplasm of ascending colon: Secondary | ICD-10-CM

## 2022-08-10 DIAGNOSIS — K5730 Diverticulosis of large intestine without perforation or abscess without bleeding: Secondary | ICD-10-CM

## 2022-08-10 DIAGNOSIS — K648 Other hemorrhoids: Secondary | ICD-10-CM

## 2022-08-10 DIAGNOSIS — M1990 Unspecified osteoarthritis, unspecified site: Secondary | ICD-10-CM

## 2022-08-10 DIAGNOSIS — K5521 Angiodysplasia of colon with hemorrhage: Secondary | ICD-10-CM

## 2022-08-10 DIAGNOSIS — K921 Melena: Secondary | ICD-10-CM | POA: Diagnosis not present

## 2022-08-10 DIAGNOSIS — K5140 Inflammatory polyps of colon without complications: Secondary | ICD-10-CM | POA: Diagnosis not present

## 2022-08-10 DIAGNOSIS — D509 Iron deficiency anemia, unspecified: Secondary | ICD-10-CM

## 2022-08-10 DIAGNOSIS — K5520 Angiodysplasia of colon without hemorrhage: Secondary | ICD-10-CM

## 2022-08-10 HISTORY — PX: COLONOSCOPY WITH PROPOFOL: SHX5780

## 2022-08-10 HISTORY — PX: HOT HEMOSTASIS: SHX5433

## 2022-08-10 LAB — BASIC METABOLIC PANEL
Anion gap: 6 (ref 5–15)
BUN: 30 mg/dL — ABNORMAL HIGH (ref 8–23)
CO2: 23 mmol/L (ref 22–32)
Calcium: 8.6 mg/dL — ABNORMAL LOW (ref 8.9–10.3)
Chloride: 112 mmol/L — ABNORMAL HIGH (ref 98–111)
Creatinine, Ser: 2.39 mg/dL — ABNORMAL HIGH (ref 0.61–1.24)
GFR, Estimated: 25 mL/min — ABNORMAL LOW (ref >60)
Glucose, Bld: 124 mg/dL — ABNORMAL HIGH (ref 70–99)
Potassium: 5.2 mmol/L — ABNORMAL HIGH (ref 3.5–5.1)
Sodium: 141 mmol/L (ref 135–145)

## 2022-08-10 LAB — CBC
HCT: 22.4 % — ABNORMAL LOW (ref 39.0–52.0)
Hemoglobin: 7.2 g/dL — ABNORMAL LOW (ref 13.0–17.0)
MCH: 31.3 pg (ref 26.0–34.0)
MCHC: 32.1 g/dL (ref 30.0–36.0)
MCV: 97.4 fL (ref 80.0–100.0)
Platelets: 61 10*3/uL — ABNORMAL LOW (ref 150–400)
RBC: 2.3 MIL/uL — ABNORMAL LOW (ref 4.22–5.81)
RDW: 14.8 % (ref 11.5–15.5)
WBC: 3 10*3/uL — ABNORMAL LOW (ref 4.0–10.5)
nRBC: 0 % (ref 0.0–0.2)

## 2022-08-10 LAB — GLUCOSE, CAPILLARY
Glucose-Capillary: 112 mg/dL — ABNORMAL HIGH (ref 70–99)
Glucose-Capillary: 116 mg/dL — ABNORMAL HIGH (ref 70–99)
Glucose-Capillary: 153 mg/dL — ABNORMAL HIGH (ref 70–99)
Glucose-Capillary: 175 mg/dL — ABNORMAL HIGH (ref 70–99)
Glucose-Capillary: 144 mg/dL — ABNORMAL HIGH (ref 70–99)

## 2022-08-10 SURGERY — COLONOSCOPY WITH PROPOFOL
Anesthesia: Monitor Anesthesia Care

## 2022-08-10 MED ORDER — SODIUM CHLORIDE 0.9 % IV SOLN: 200.0000 mg | Freq: Once | INTRAVENOUS | Status: DC

## 2022-08-10 MED ORDER — SODIUM POLYSTYRENE SULFONATE 15 GM/60ML PO SUSP
30.0000 g | Freq: Once | ORAL | Status: DC
  Filled 2022-08-10: qty 120

## 2022-08-10 MED ORDER — FERROUS SULFATE 325 (65 FE) MG PO TABS
325.0000 mg | ORAL_TABLET | Freq: Every day | ORAL | Status: DC
  Administered 2022-08-11 – 2022-08-13 (×3): 325 mg via ORAL
  Filled 2022-08-10 (×3): qty 1

## 2022-08-10 MED ORDER — PROPOFOL 10 MG/ML IV BOLUS
INTRAVENOUS | Status: DC | PRN
  Administered 2022-08-10 (×2): 20 mg via INTRAVENOUS

## 2022-08-10 MED ORDER — EPHEDRINE SULFATE-NACL 50-0.9 MG/10ML-% IV SOSY
PREFILLED_SYRINGE | INTRAVENOUS | Status: DC | PRN
  Administered 2022-08-10 (×2): 5 mg via INTRAVENOUS

## 2022-08-10 MED ORDER — LACTATED RINGERS IV SOLN: INTRAVENOUS | Status: DC

## 2022-08-10 MED ORDER — PHENYLEPHRINE 80 MCG/ML (10ML) SYRINGE FOR IV PUSH (FOR BLOOD PRESSURE SUPPORT)
PREFILLED_SYRINGE | INTRAVENOUS | Status: DC | PRN
  Administered 2022-08-10: 80 ug via INTRAVENOUS
  Administered 2022-08-10: 160 ug via INTRAVENOUS
  Administered 2022-08-10: 80 ug via INTRAVENOUS

## 2022-08-10 MED ORDER — SODIUM ZIRCONIUM CYCLOSILICATE 10 G PO PACK
10.0000 g | PACK | Freq: Once | ORAL | Status: AC
  Administered 2022-08-10: 10 g via ORAL
  Filled 2022-08-10: qty 1

## 2022-08-10 MED ORDER — PROPOFOL 500 MG/50ML IV EMUL
INTRAVENOUS | Status: DC | PRN
  Administered 2022-08-10: 100 ug/kg/min via INTRAVENOUS

## 2022-08-10 MED ORDER — SODIUM CHLORIDE 0.9 % IV SOLN
250.0000 mg | Freq: Once | INTRAVENOUS | Status: DC
  Filled 2022-08-10: qty 20

## 2022-08-10 SURGICAL SUPPLY — 22 items

## 2022-08-10 NOTE — Plan of Care (Signed)
  Problem: Safety: Goal: Ability to remain free from injury will improve Outcome: Not Progressing   Problem: Elimination: Goal: Will not experience complications related to bowel motility Outcome: Not Progressing   Problem: Clinical Measurements: Goal: Diagnostic test results will improve Outcome: Not Progressing

## 2022-08-10 NOTE — Progress Notes (Signed)
Informed Gershon Cull, NP that patient stated he doesn't want to take the 2nd dose of Moviprep as he had been having clear stool since dayshift and that he has been on liquid diet the past days, this nurse have personally seen 2 incidences of his bowel movement and they were yellow and clear without any residue. Olena Heckle, NP noted per response.

## 2022-08-10 NOTE — Anesthesia Preprocedure Evaluation (Addendum)
Anesthesia Evaluation  Patient identified by MRN, date of birth, ID band Patient awake    Reviewed: Allergy & Precautions, NPO status , Patient's Chart, lab work & pertinent test results  History of Anesthesia Complications (+) PONV and history of anesthetic complications  Airway Mallampati: I  TM Distance: >3 FB Neck ROM: Full    Dental  (+) Dental Advisory Given, Teeth Intact   Pulmonary former smoker   Pulmonary exam normal        Cardiovascular hypertension, Pt. on medications and Pt. on home beta blockers + CAD, + Past MI, + CABG and + Peripheral Vascular Disease  Normal cardiovascular exam+ dysrhythmias Atrial Fibrillation + pacemaker    '19 TTE - moderate concentric hypertrophy. EF 55% to 60%. Hypokinesis of the apical septal myocardium. Grade 1 diastolic dysfunction. Mild MR. LA was severely dilated. Trivial TR. PASP was mildly increased. PA peak pressure: 41 mm Hg      Neuro/Psych CVA, No Residual Symptoms  negative psych ROS   GI/Hepatic Neg liver ROS, PUD,GERD  Medicated and Controlled,,  Endo/Other  diabetes, Type 2, Insulin Dependent, Oral Hypoglycemic Agents    Renal/GU CRFRenal disease     Musculoskeletal  (+) Arthritis ,    Abdominal   Peds  Hematology  (+) Blood dyscrasia, anemia  On eliquis    Anesthesia Other Findings   Reproductive/Obstetrics                             Anesthesia Physical Anesthesia Plan  ASA: 3  Anesthesia Plan: MAC   Post-op Pain Management: Minimal or no pain anticipated   Induction:   PONV Risk Score and Plan: 2 and Propofol infusion and Treatment may vary due to age or medical condition  Airway Management Planned: Natural Airway and Simple Face Mask  Additional Equipment: None  Intra-op Plan:   Post-operative Plan:   Informed Consent: I have reviewed the patients History and Physical, chart, labs and discussed the procedure  including the risks, benefits and alternatives for the proposed anesthesia with the patient or authorized representative who has indicated his/her understanding and acceptance.       Plan Discussed with: CRNA and Anesthesiologist  Anesthesia Plan Comments:        Anesthesia Quick Evaluation

## 2022-08-10 NOTE — Progress Notes (Signed)
PROGRESS NOTE  [name]  DOB: [birth year]  PCP: Tonia Ghent, MD [id number]  DOA: 08/07/2022  LOS: 2 days  Hospital Day: 4  Brief narrative: Dean Murphy is a 89 y.o. male with PMH significant for DM2, HTN, HLD, PAD s/p bilateral CEA, CAD/CABG, A-fib, CHB s/p PPM 2016, CVA on Eliquis, CHF, chronic thrombocytopenia 3/15, patient presented to the ED with complaint of dark stool, and progressive weakness Patient was seen by his PCP few days prior for 3 to 4 weeks history of black stool and a drop in hemoglobin to 9.3 from a baseline of 12.4.  He was recommended to hold Eliquis and stop ibuprofen despite which dark stool continued and hence sent to the ED.  On initial workup, hemoglobin was low at 8.7, FOBT positive Started on IV Protonix Admitted to [facility]. GI consulted  Subjective: Patient was seen and examined this afternoon. Pleasant elderly Caucasian male.  Lying down in bed.  Underwent colonoscopy earlier today. Wife and daughter at bedside. Chart reviewed In the last 24 hours, afebrile, hemodynamically stable Last set of labs from this morning with potassium elevated at 5.2, BUN/creatinine 38/2.39, hemoglobin down at 7.2 from 8.6 yesterday Underwent EGD yesterday.  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  Biopsies taken. Recommended to avoid NSAIDs.  Continue PPI twice daily. Given low ferritin, patient probably had slow insidious GI bleeding in the span of the last several months. IV iron 1 dose ordered per GI recommendation, start on iron sulfate from tomorrow Resume Eliquis after 5 days of colonoscopy, 3/23 Recent Labs    12/18/21 0937 01/20/22 1354  08/04/22 1142 08/07/22 1238 08/08/22 1751 08/09/22 0323 08/09/22 0930 08/09/22 1742 08/10/22 0301  HGB 13.4   < > 9.3*   < > 8.2* 7.5* 7.3* 8.6* 7.2*  MCV 92.9   < > 96.2   < > 100.0 97.1 95.6 96.7 97.4  FERRITIN  --   --  20.6*  --   --   --   --   --   --   IRON 89  --  148  --   --   --   --   --   --    < > = values in this interval not displayed.   Acute on chronic thrombocytopenia Acute drop of platelet likely due to consumption due to active bleeding Low but stable platelet count. Recent Labs  Lab 08/04/22 1142 08/07/22 1238 08/07/22 2025 08/08/22 0218 08/08/22 1018 08/08/22 1751 08/09/22 0323 08/09/22 0930 08/09/22 1742 08/10/22 0301  PLT 112.0* 88* 72* 66* 66* 70* 70* 63* 78* 61*   Hyperkalemia K 6.4 on presentation.  Patient reportedly had intolerance to [facility] in the past. We'll try Kayexalate. Losartan on hold.  Potassium level gradually improving. Continue to monitor. Recent Labs  Lab 08/08/22 0218 08/08/22 0633 08/08/22 1725 08/09/22 0323 08/09/22 0930 08/09/22 1742 08/10/22 0301  K 5.8*   < > 5.6* 5.5* 5.2* 4.8 5.2*  MG 2.4  --   --   --   --   --   --    < > = values in this interval not displayed.   CKD 4 Chronic metabolic acidosis Baseline creatinine close to 2.5.  Currently at  baseline Serum bicarb level improving on sodium bicarb supplement.  Recent Labs    08/07/22 1238 08/07/22 2025 08/08/22 0218 08/08/22 0633 08/08/22 1018 08/08/22 1725 08/09/22 0323 08/09/22 0930 08/09/22 1742 08/10/22 0301  BUN 47* 44* 42* 40* 39* 36* 35* 33* 32* 30*  CREATININE 2.78* 2.63* 2.53* 2.61* 2.46* 2.39* 2.42* 2.37* 2.48* 2.39*  CO2 16* 18* 18* 18* 17* 19* 20* 23 21* 23   Type 2 diabetes mellitus A1c 6.3 on 08/04/2022 PTA on glipizide twice daily, Lantus 34 units daily, Januvia 50 mg daily Currently on Lantus 15 units daily and sliding scale insulin with Accu-Cheks. Recent Labs  Lab 08/09/22 1655 08/09/22 2005 08/10/22 0801 08/10/22 0944  08/10/22 1144  GLUCAP 127* 215* 112* 116* 153*   Chronic diastolic CHF Essential hypertension Currently euvolemic. PTA on Toprol, Demadex Continue Toprol 25 mg nightly.  Diuretics on hold.  Cerebrovascular diseases HLD, PAD s/p bilateral CEA, CAD/CABG, CVA on Eliquis No anginal symptoms at this time Continue Crestor.  Avoid antiplatelet or anticoagulation given active bleeding  A-fib, CHB s/p PPM 2016 Currently on Toprol 25 mg nightly Eliquis on hold given GI bleeding   Chronic low back pain  Apparently had a fall in August 2023 leading to L1 and L2 compression fractures with mild height loss as evidenced on CT 12/2021  Currently on topical lidocaine patch provided  On my evaluation this morning, patient states that his quality of life has been significantly affected by the back pain.  He is open to the idea of kyphoplasty.  IR consulted. F/u with pcp and neurosurgery outpatient   Mobility:   Goals of care   Code Status: Full Code     DVT prophylaxis:  Place and maintain sequential compression device Start: 08/08/22 1005   Antimicrobials: None currently Fluid: None Consultants: GI, IR Family Communication: Daughter and wife at bedside  Status: Inpatient Level of care:  Telemetry   Needs to continue in-hospital care:  Needs hemoglobin monitoring, IR to consider for kyphoplasty  Prognosis:  Guarded given muscle deconditioning and impaired mobility due to back pain  Patient from: Home Anticipated d/c to: Pending kyphoplasty and mobility after that     Scheduled Meds:  [START ON 08/11/2022] ferrous sulfate  325 mg Oral Q breakfast   insulin aspart  0-9 Units Subcutaneous TID WC   insulin glargine-yfgn  15 Units Subcutaneous Daily   lidocaine  1 patch Transdermal Q24H   lubiprostone  8 mcg Oral Q breakfast   metoprolol succinate  25 mg Oral QHS   pantoprazole (PROTONIX) IV  40 mg Intravenous Q12H   peg 3350 powder  0.5 kit Oral Once   rosuvastatin  20 mg Oral  Daily   sodium bicarbonate  650 mg Oral BID   sodium chloride flush  3 mL Intravenous Q12H   sodium polystyrene  30 g Rectal Once    PRN meds: acetaminophen **OR** acetaminophen, polyethylene glycol   Infusions:   ferric gluconate (FERRLECIT) IVPB     lactated ringers 10 mL/hr at 08/10/22 [id number]    Diet:  Diet Order             DIET SOFT Room service appropriate? Yes; Fluid consistency: Thin  Diet effective now                   Antimicrobials: Anti-infectives (From admission, onward)    None       Skin assessment:       Nutritional status:  Body mass index is  27.21 kg/m.          Objective: Vitals:   08/10/22 1015 08/10/22 1040  BP: 137/64 138/61  Pulse: (!) 59 (!) 55  Resp: 13 18  Temp: (!) 97.5 F (36.4 C) (!) 97.5 F (36.4 C)  SpO2: 99% 100%    Intake/Output Summary (Last 24 hours) at 08/10/2022 1425 Last data filed at 08/10/2022 0946 Gross per 24 hour  Intake 794 ml  Output --  Net 794 ml   Filed Weights   08/08/22 0500 08/09/22 0500 08/10/22 0420  Weight: 90 kg 88.2 kg 88.5 kg   Weight change: 0.3 kg Body mass index is 27.21 kg/m.   Physical Exam: General exam: Pleasant, elderly male.  Lying down in bed Skin: No rashes, lesions or ulcers. HEENT: Atraumatic, normocephalic, no obvious bleeding Lungs: Clear to auscultation bilaterally CVS: Regular rate and rhythm, no murmur GI/Abd: soft, nontender, nondistended, bowel sound present CNS: Alert, awake, oriented x 3 Psychiatry: Mood appropriate Extremities: No pedal edema, no calf tenderness  Data Review: I have personally reviewed the laboratory data and studies available.  F/u labs ordered Unresulted Labs (From admission, onward)     Start     Ordered   08/11/22 0500  CBC with Differential/Platelet  Tomorrow morning,   R        08/10/22 1425   08/11/22 [id number]  Basic metabolic panel  Tomorrow morning,   R        08/10/22 1425            Total time spent in review of  labs and imaging, patient evaluation, formulation of plan, documentation and communication with family: 42 minutes  Signed, Terrilee Croak, MD [facility] 08/10/2022

## 2022-08-10 NOTE — Interval H&P Note (Signed)
History and Physical Interval Note:  08/10/2022 8:44 AM  Dean Murphy  has presented today for surgery, with the diagnosis of anemia and GI bleeding.  The various methods of treatment have been discussed with the patient and family. After consideration of risks, benefits and other options for treatment, the patient has consented to  Procedure(s): COLONOSCOPY WITH PROPOFOL (N/A) as a surgical intervention.  The patient's history has been reviewed, patient examined, no change in status, stable for surgery.  I have reviewed the patient's chart and labs.  Questions were answered to the patient's satisfaction.     [name]

## 2022-08-10 NOTE — Transfer of Care (Signed)
Immediate Anesthesia Transfer of Care Note  Patient: Dean Murphy  Procedure(s) Performed: COLONOSCOPY WITH PROPOFOL HOT HEMOSTASIS (ARGON PLASMA COAGULATION/BICAP)  Patient Location: PACU  Anesthesia Type:MAC  Level of Consciousness: awake, alert , and oriented  Airway & Oxygen Therapy: Patient Spontanous Breathing and Patient connected to nasal cannula oxygen  Post-op Assessment: Report given to RN and Post -op Vital signs reviewed and stable  Post vital signs: Reviewed and stable  Last Vitals:  Vitals Value Taken Time  BP 115/43 08/10/22 0945  Temp    Pulse 65 08/10/22 0946  Resp 14 08/10/22 0946  SpO2 98 % 08/10/22 0946  Vitals shown include unvalidated device data.  Last Pain:  Vitals:   08/10/22 0820  TempSrc: Temporal  PainSc: 0-No pain         Complications: No notable events documented.

## 2022-08-10 NOTE — Anesthesia Postprocedure Evaluation (Signed)
Anesthesia Post Note  Patient: Dean Murphy  Procedure(s) Performed: COLONOSCOPY WITH PROPOFOL HOT HEMOSTASIS (ARGON PLASMA COAGULATION/BICAP)     Patient location during evaluation: PACU Anesthesia Type: MAC Level of consciousness: awake and alert Pain management: pain level controlled Vital Signs Assessment: post-procedure vital signs reviewed and stable Respiratory status: spontaneous breathing, nonlabored ventilation and respiratory function stable Cardiovascular status: stable and blood pressure returned to baseline Anesthetic complications: no   No notable events documented.  Last Vitals:  Vitals:   08/10/22 1015 08/10/22 1040  BP: 137/64 138/61  Pulse: (!) 59 (!) 55  Resp: 13 18  Temp: (!) 36.4 C (!) 36.4 C  SpO2: 99% 100%    Last Pain:  Vitals:   08/10/22 1040  TempSrc: Oral  PainSc:                  Audry Pili

## 2022-08-10 NOTE — Anesthesia Postprocedure Evaluation (Signed)
Anesthesia Post Note  Patient: Dean Murphy  Procedure(s) Performed: ESOPHAGOGASTRODUODENOSCOPY (EGD)     Patient location during evaluation: PACU Anesthesia Type: MAC Level of consciousness: awake and alert Pain management: pain level controlled Vital Signs Assessment: post-procedure vital signs reviewed and stable Respiratory status: spontaneous breathing, nonlabored ventilation, respiratory function stable and patient connected to nasal cannula oxygen Cardiovascular status: stable and blood pressure returned to baseline Postop Assessment: no apparent nausea or vomiting Anesthetic complications: no  No notable events documented.  Last Vitals:  Vitals:   08/10/22 1040 08/10/22 1655  BP: 138/61 (!) 149/54  Pulse: (!) 55 71  Resp: 18 18  Temp: (!) 36.4 C 36.7 C  SpO2: 100% 99%    Last Pain:  Vitals:   08/10/22 1655  TempSrc: Oral  PainSc:                  Beda Dula

## 2022-08-10 NOTE — Anesthesia Procedure Notes (Signed)
Procedure Name: MAC Date/Time: 08/10/2022 9:06 AM  Performed by: Dorann Lodge, CRNAPre-anesthesia Checklist: Emergency Drugs available, Patient identified, Suction available and Patient being monitored Oxygen Delivery Method: Nasal cannula

## 2022-08-10 NOTE — Op Note (Signed)
[facility] Patient Name: Dean Murphy Procedure Date : 08/10/2022 MRN: [id number] Attending MD: Mauri Pole , MD, [id number] Date of Birth: [birth year] CSN: [id number] Age: 89 Admit Type: Inpatient Procedure:                Colonoscopy Indications:              Evaluation of unexplained GI bleeding presenting                            with Hematochezia, Evaluation of unexplained GI                            bleeding presenting with Melena (upper GI source                            has been excluded), Evaluation of unexplained GI                            bleeding presenting with fecal occult blood,                            Unexplained iron deficiency anemia Providers:                Mauri Pole, MD, Dulcy Fanny, Janee Morn, Technician Referring MD:              Medicines:                Monitored Anesthesia Care Complications:            No immediate complications. Estimated Blood Loss:     Estimated blood loss was minimal. Procedure:                Pre-Anesthesia Assessment:                           - Prior to the procedure, a History and Physical                            was performed, and patient medications and                            allergies were reviewed. The patient's tolerance of                            previous anesthesia was also reviewed. The risks                            and benefits of the procedure and the sedation                            options and risks were discussed with the patient.                            All  questions were answered, and informed consent                            was obtained. Prior Anticoagulants: The patient                            last took Eliquis (apixaban) 4 days prior to the                            procedure. ASA Grade Assessment: III - A patient                            with severe systemic disease. After reviewing the                             risks and benefits, the patient was deemed in                            satisfactory condition to undergo the procedure.                           After obtaining informed consent, the colonoscope                            was passed under direct vision. Throughout the                            procedure, the patient's blood pressure, pulse, and                            oxygen saturations were monitored continuously. The                            [id number]) Olympus peds colonscope was                            introduced through the anus and advanced to the the                            cecum, identified by appendiceal orifice and                            ileocecal valve. The colonoscopy was performed                            without difficulty. The patient tolerated the                            procedure well. The quality of the bowel                            preparation was adequate to identify polyps greater  than 5 mm in size. The ileocecal valve, appendiceal                            orifice, and rectum were photographed. Scope In: 9:12:24 AM Scope Out: 9:36:51 AM Scope Withdrawal Time: 0 hours 22 minutes 21 seconds  Total Procedure Duration: 0 hours 24 minutes 27 seconds  Findings:      The perianal and digital rectal examinations were normal.      Two small angioectasias with typical arborization were found in the       cecum. Coagulation for bleeding prevention using argon plasma was       successful.      A 20 mm polyp was found in the ascending colon. The polyp was sessile.       The polyp was removed with a piecemeal technique using a hot snare.       Resection and retrieval were complete.      Two sessile polyps were found in the sigmoid colon. The polyps were 8 to       9 mm in size. These polyps were removed with a hot snare. Resection and       retrieval were complete.      Two patchy non-bleeding erosions were  found in the sigmoid colon.      Scattered small-mouthed diverticula were found in the sigmoid colon.      Non-bleeding internal hemorrhoids were found during retroflexion. The       hemorrhoids were medium-sized. Impression:               - Two colonic angioectasias. Treated with argon                            plasma coagulation (APC).                           - One 20 mm polyp in the ascending colon, removed                            piecemeal using a hot snare. Resected and retrieved.                           - Two 8 to 9 mm polyps in the sigmoid colon,                            removed with a hot snare. Resected and retrieved.                           - Two erosions in the sigmoid colon.                           - Diverticulosis in the sigmoid colon.                           - Non-bleeding internal hemorrhoids. Recommendation:           - Resume previous diet.                           -  Continue present medications.                           - Await pathology results.                           - No repeat colonoscopy due to age.                           - IV Iron infusion (Ferraheme X 2)                           - Avoid NSAID's                           - Resume Eliquis (apixaban) at prior dose in 5                            days. Refer to managing physician for further                            adjustment of therapy.                           - Follow up with PCP as outpatient. Monitor Hgb and                            transfuse as needed                           - GI will sign off, please call with any questions Procedure Code(s):        --- Professional ---                           [phone], 59, Colonoscopy, flexible; with control of                            bleeding, any method                           [id number], Colonoscopy, flexible; with removal of                            tumor(s), polyp(s), or other lesion(s) by snare                             technique Diagnosis Code(s):        --- Professional ---                           [id number], Angiodysplasia of colon without hemorrhage                           D12.2, Benign neoplasm of ascending colon  D12.5, Benign neoplasm of sigmoid colon                           K63.3, Ulcer of intestine                           K64.8, Other hemorrhoids                           K92.1, Melena (includes Hematochezia)                           R19.5, Other fecal abnormalities                           D50.9, Iron deficiency anemia, unspecified                           K57.30, Diverticulosis of large intestine without                            perforation or abscess without bleeding CPT copyright 2022 [employer]. All rights reserved. The codes documented in this report are preliminary and upon coder review may  be revised to meet current compliance requirements. Mauri Pole, MD 08/10/2022 10:00:59 AM This report has been signed electronically. Number of Addenda: 0

## 2022-08-11 ENCOUNTER — Encounter (HOSPITAL_COMMUNITY): Payer: Self-pay | Admitting: Gastroenterology

## 2022-08-11 DIAGNOSIS — K921 Melena: Secondary | ICD-10-CM | POA: Diagnosis not present

## 2022-08-11 LAB — BASIC METABOLIC PANEL
Anion gap: 5 (ref 5–15)
BUN: 27 mg/dL — ABNORMAL HIGH (ref 8–23)
CO2: 24 mmol/L (ref 22–32)
Calcium: 8.4 mg/dL — ABNORMAL LOW (ref 8.9–10.3)
Chloride: 112 mmol/L — ABNORMAL HIGH (ref 98–111)
Creatinine, Ser: 2.52 mg/dL — ABNORMAL HIGH (ref 0.61–1.24)
GFR, Estimated: 23 mL/min — ABNORMAL LOW (ref >60)
Glucose, Bld: 139 mg/dL — ABNORMAL HIGH (ref 70–99)
Potassium: 5.1 mmol/L (ref 3.5–5.1)
Sodium: 141 mmol/L (ref 135–145)

## 2022-08-11 LAB — CBC WITH DIFFERENTIAL/PLATELET
Abs Immature Granulocytes: 0 10*3/uL (ref 0.00–0.07)
Basophils Absolute: 0 10*3/uL (ref 0.0–0.1)
Basophils Relative: 1 %
Eosinophils Absolute: 0.1 10*3/uL (ref 0.0–0.5)
Eosinophils Relative: 3 %
HCT: 21.2 % — ABNORMAL LOW (ref 39.0–52.0)
Hemoglobin: 7 g/dL — ABNORMAL LOW (ref 13.0–17.0)
Immature Granulocytes: 0 %
Lymphocytes Relative: 24 %
Lymphs Abs: 0.8 10*3/uL (ref 0.7–4.0)
MCH: 32 pg (ref 26.0–34.0)
MCHC: 33 g/dL (ref 30.0–36.0)
MCV: 96.8 fL (ref 80.0–100.0)
Monocytes Absolute: 0.3 10*3/uL (ref 0.1–1.0)
Monocytes Relative: 9 %
Neutro Abs: 2.3 10*3/uL (ref 1.7–7.7)
Neutrophils Relative %: 63 %
Platelets: 65 10*3/uL — ABNORMAL LOW (ref 150–400)
RBC: 2.19 MIL/uL — ABNORMAL LOW (ref 4.22–5.81)
RDW: 14.6 % (ref 11.5–15.5)
WBC: 3.5 10*3/uL — ABNORMAL LOW (ref 4.0–10.5)
nRBC: 0 % (ref 0.0–0.2)

## 2022-08-11 LAB — GLUCOSE, CAPILLARY
Glucose-Capillary: 104 mg/dL — ABNORMAL HIGH (ref 70–99)
Glucose-Capillary: 148 mg/dL — ABNORMAL HIGH (ref 70–99)
Glucose-Capillary: 110 mg/dL — ABNORMAL HIGH (ref 70–99)
Glucose-Capillary: 215 mg/dL — ABNORMAL HIGH (ref 70–99)

## 2022-08-11 LAB — PREPARE RBC (CROSSMATCH)

## 2022-08-11 LAB — SURGICAL PATHOLOGY

## 2022-08-11 MED ORDER — MORPHINE SULFATE (PF) 2 MG/ML IV SOLN: 1.0000 mg | INTRAVENOUS | Status: DC | PRN

## 2022-08-11 MED ORDER — SODIUM CHLORIDE 0.9% IV SOLUTION
Freq: Once | INTRAVENOUS | Status: AC
  Administered 2022-08-11: 10 mL/h via INTRAVENOUS

## 2022-08-11 MED ORDER — PANTOPRAZOLE SODIUM 40 MG PO TBEC
40.0000 mg | DELAYED_RELEASE_TABLET | Freq: Two times a day (BID) | ORAL | Status: DC
  Administered 2022-08-11 – 2022-08-13 (×5): 40 mg via ORAL
  Filled 2022-08-11 (×5): qty 1

## 2022-08-11 MED ORDER — OXYCODONE HCL 5 MG PO TABS
5.0000 mg | ORAL_TABLET | Freq: Four times a day (QID) | ORAL | Status: DC | PRN
  Administered 2022-08-11: 5 mg via ORAL
  Filled 2022-08-11: qty 1

## 2022-08-11 MED ORDER — ACETAMINOPHEN 500 MG PO TABS
1000.0000 mg | ORAL_TABLET | Freq: Three times a day (TID) | ORAL | Status: DC
  Administered 2022-08-11 – 2022-08-13 (×8): 1000 mg via ORAL
  Filled 2022-08-11 (×8): qty 2

## 2022-08-11 MED ORDER — MELATONIN 3 MG PO TABS
3.0000 mg | ORAL_TABLET | Freq: Every day | ORAL | Status: DC
  Administered 2022-08-11 – 2022-08-12 (×2): 3 mg via ORAL
  Filled 2022-08-11 (×2): qty 1

## 2022-08-11 NOTE — TOC CM/SW Note (Signed)
  Transition of Care [facility]) Screening Note   Patient Details  Name: Dean Murphy Date of Birth: June 08, 1926   Transition of Care [facility]) CM/SW Contact:    Marilu Favre, RN Phone Number: 08/11/2022, 9:57 AM   IR consulted for possible   kyphoplasty.  From home with wife .   Transition of Care Department [facility]) has reviewed patient and no TOC needs have been identified at this time. We will continue to monitor patient advancement through interdisciplinary progression rounds. If new patient transition needs arise, please place a TOC consult.

## 2022-08-11 NOTE — Plan of Care (Signed)
  Problem: Education: Goal: Ability to describe self-care measures that may prevent or decrease complications (Diabetes Survival Skills Education) will improve Outcome: Progressing   Problem: Coping: Goal: Ability to adjust to condition or change in health will improve Outcome: Progressing   Problem: Fluid Volume: Goal: Ability to maintain a balanced intake and output will improve Outcome: Progressing   Problem: Nutritional: Goal: Maintenance of adequate nutrition will improve Outcome: Progressing Goal: Progress toward achieving an optimal weight will improve Outcome: Progressing   Problem: Education: Goal: Knowledge of General Education information will improve Description: Including pain rating scale, medication(s)/side effects and non-pharmacologic comfort measures Outcome: Progressing   Problem: Clinical Measurements: Goal: Diagnostic test results will improve Outcome: Progressing   Problem: Pain Managment: Goal: General experience of comfort will improve Outcome: Progressing

## 2022-08-11 NOTE — Progress Notes (Signed)
PROGRESS NOTE  Dean Murphy  DOB: May 24, 1927  PCP: Tonia Ghent, MD GJ:[id number]  DOA: 08/07/2022  LOS: 3 days  Hospital Day: 5  Brief narrative: Dean Murphy is a 89 y.o. male with PMH significant for DM2, HTN, HLD, PAD s/p bilateral CEA, CAD/CABG, A-fib, CHB s/p PPM 2016, CVA on Eliquis, CHF, chronic thrombocytopenia 3/15, patient presented to the ED with complaint of dark stool, and progressive weakness Patient was seen by his PCP few days prior for 3 to 4 weeks history of black stool and a drop in hemoglobin to 9.3 from a baseline of 12.4.  He was recommended to hold Eliquis and stop ibuprofen despite which dark stool continued and hence sent to the ED.  On initial workup, hemoglobin was low at 8.7, FOBT positive Started on IV Protonix Admitted to [facility]. GI consulted  Subjective: Patient was seen and examined this morning.  Pleasant elderly Caucasian male.  Propped up in bed. Walked with PT earlier but had to cut short because of severe back pain.[name] Son Dean Murphy at bedside at the time of my evaluation Pending evaluation by IR for kyphoplasty. Labs from this morning showed hemoglobin low at 7.  We discussed about blood transfusion.  Consent received  Assessment and plan: Acute on chronic GI bleeding Presented with dark stools, weakness and drop in hemoglobin GI consulted 3/17, EGD was normal except for small sliding hiatal hernia 3/18, colonoscopy showed 2 colonic angiectasia treated with APC, ascending colon and sigmoid colon polyp, sigmoid colon diverticulosis and nonbleeding internal hemorrhoids.  Biopsies taken. Recommended to avoid NSAIDs.  Continue PPI twice daily. Plan to resume Eliquis after 5 days of colonoscopy, 3/23  Acute on chronic blood loss anemia Iron deficiency anemia Hemoglobin at baseline 13.4 from July 2023.  Presented with a low hemoglobin of 7.5 secondary to GI bleeding.  Anemia panel showed low ferritin level.  Patient probably had slow insidious GI  bleeding in the span of the last several months. IV iron 1 dose was given.  Oral iron supplement started. Hemoglobin this morning is low at 7.  Given his significant history of CAD and complaint of easy fatigability, he would benefit from 1 unit of PRBC transfusion today.  Discussed with patient and his son.  Consent received.  Transfusion ordered. Recent Labs    12/18/21 0937 01/20/22 1354 08/04/22 1142 08/07/22 1238 08/09/22 0323 08/09/22 0930 08/09/22 1742 08/10/22 0301 08/11/22 0110  HGB 13.4   < > 9.3*   < > 7.5* 7.3* 8.6* 7.2* 7.0*  MCV 92.9   < > 96.2   < > 97.1 95.6 96.7 97.4 96.8  FERRITIN  --   --  20.6*  --   --   --   --   --   --   IRON 89  --  148  --   --   --   --   --   --    < > = values in this interval not displayed.   Intractable back pain  Apparently had a fall in August 2023 leading to L1 and L2 compression fractures with mild height loss as evidenced on CT 12/2021  Currently on topical lidocaine patch provided  On my evaluation, patient states that his quality of life has been significantly affected by the back pain.  He is open to the idea of kyphoplasty.  IR consulted.  Acute on chronic thrombocytopenia Acute drop of platelet likely due to consumption due to active bleeding Low but stable  platelet count.  No need of platelet transfusion at this time. Recent Labs  Lab 08/07/22 1238 08/07/22 2025 08/08/22 0218 08/08/22 1018 08/08/22 1751 08/09/22 0323 08/09/22 0930 08/09/22 1742 08/10/22 0301 08/11/22 0110  PLT 88* 72* 66* 66* 70* 70* 63* 78* 61* 65*    Hyperkalemia K 6.4 on presentation.   Losartan on hold.  1 dose of Lokelma was given yesterday.  Potassium level gradually improving. Continue to monitor. Recent Labs  Lab 08/08/22 0218 08/08/22 0633 08/09/22 0323 08/09/22 0930 08/09/22 1742 08/10/22 0301 08/11/22 0110  K 5.8*   < > 5.5* 5.2* 4.8 5.2* 5.1  MG 2.4  --   --   --   --   --   --    < > = values in this interval not  displayed.    CKD 4 Chronic metabolic acidosis Baseline creatinine close to 2.5.  Currently at baseline Serum bicarb level improving on sodium bicarb supplement.  Recent Labs    08/07/22 2025 08/08/22 0218 08/08/22 0633 08/08/22 1018 08/08/22 1725 08/09/22 0323 08/09/22 0930 08/09/22 1742 08/10/22 0301 08/11/22 0110  BUN 44* 42* 40* 39* 36* 35* 33* 32* 30* 27*  CREATININE 2.63* 2.53* 2.61* 2.46* 2.39* 2.42* 2.37* 2.48* 2.39* 2.52*  CO2 18* 18* 18* 17* 19* 20* 23 21* 23 24    Type 2 diabetes mellitus A1c 6.3 on 08/04/2022 PTA on glipizide twice daily, Lantus 34 units daily, Januvia 50 mg daily Currently blood sugar level stable on Semglee 15 units daily and sliding scale insulin with Accu-Cheks. Recent Labs  Lab 08/10/22 0944 08/10/22 1144 08/10/22 1653 08/10/22 2115 08/11/22 0854  GLUCAP 116* 153* 175* 144* 104*    Chronic diastolic CHF Essential hypertension Currently euvolemic. PTA on Toprol, Demadex Continue Toprol 25 mg nightly.  Diuretics on hold.  Continue to hold for today.  Cerebrovascular diseases HLD, PAD s/p bilateral CEA, CAD/CABG, CVA on Eliquis No anginal symptoms at this time Continue Crestor.   Eliquis plan as above  A-fib, CHB s/p PPM 2016 Currently on Toprol 25 mg nightly Eliquis plan as above    Mobility: PT eval appreciated  Goals of care   Code Status: Full Code     DVT prophylaxis:  Place and maintain sequential compression device Start: 08/08/22 1005   Antimicrobials: None currently Fluid: None Consultants: GI, IR Family Communication: Son at bedside  Status: Inpatient Level of care:  Telemetry   Needs to continue in-hospital care:  1 unit PRBC present today.  Needs hemoglobin monitoring, IR to consider for kyphoplasty  Prognosis:  Guarded given muscle deconditioning and impaired mobility due to back pain  Patient from: Home Anticipated d/c to: Pending kyphoplasty and mobility after that     Scheduled Meds:   sodium chloride   Intravenous Once   acetaminophen  1,000 mg Oral TID   ferrous sulfate  325 mg Oral Q breakfast   insulin aspart  0-9 Units Subcutaneous TID WC   insulin glargine-yfgn  15 Units Subcutaneous Daily   lidocaine  1 patch Transdermal Q24H   lubiprostone  8 mcg Oral Q breakfast   metoprolol succinate  25 mg Oral QHS   pantoprazole  40 mg Oral BID   rosuvastatin  20 mg Oral Daily   sodium bicarbonate  650 mg Oral BID   sodium chloride flush  3 mL Intravenous Q12H    PRN meds: morphine injection, oxyCODONE, polyethylene glycol   Infusions:   ferric gluconate (FERRLECIT) IVPB     lactated ringers  10 mL/hr at 08/10/22 2200    Diet:  Diet Order             Diet regular Room service appropriate? Yes; Fluid consistency: Thin  Diet effective now                   Antimicrobials: Anti-infectives (From admission, onward)    None       Skin assessment:       Nutritional status:  Body mass index is 27.37 kg/m.          Objective: Vitals:   08/11/22 0504 08/11/22 0849  BP: (!) 111/46 (!) 118/55  Pulse: 70 70  Resp: 17 18  Temp: 98.3 F (36.8 C) 97.7 F (36.5 C)  SpO2: 100% 98%   No intake or output data in the 24 hours ending 08/11/22 1119  Filed Weights   08/09/22 0500 08/10/22 0420 08/11/22 0500  Weight: 88.2 kg 88.5 kg 89 kg   Weight change: 0.5 kg Body mass index is 27.37 kg/m.   Physical Exam: General exam: Pleasant, elderly male.  Lying down in bed.  Mild distress because of back pain with ambulation. Skin: No rashes, lesions or ulcers. HEENT: Atraumatic, normocephalic, no obvious bleeding Lungs: Clear to auscultation bilaterally CVS: Regular rate and rhythm, no murmur GI/Abd: soft, nontender, nondistended, bowel sound present CNS: Alert, awake, oriented x 3 Psychiatry: Mood appropriate Extremities: No pedal edema, no calf tenderness  Data Review: I have personally reviewed the laboratory data and studies available.  F/u  labs ordered Unresulted Labs (From admission, onward)     Start     Ordered   08/11/22 1037  Prepare RBC (crossmatch)  (Blood Administration Adult)  Once,   R       Question Answer Comment  # of Units 1 unit   Transfusion Indications Hemoglobin 8 gm/dL or less and orthopedic or cardiac surgery or pre-existing cardiac condition   Number of Units to Keep Ahead NO units ahead   If emergent release call blood bank Not emergent release      08/11/22 1036   08/11/22 1036  Type and screen [place]  (Blood Administration Adult)  Once,   R       Comments: [place]    08/11/22 1036            Total time spent in review of labs and imaging, patient evaluation, formulation of plan, documentation and communication with family: 25 minutes  Signed, Terrilee Croak, MD [facility] 08/11/2022

## 2022-08-11 NOTE — Care Management Important Message (Signed)
Important Message  Patient Details  Name: Dean Murphy MRN: [id number] Date of Birth: 1927/02/07   Medicare Important Message Given:  Yes     [name] 08/11/2022, 12:03 PM

## 2022-08-11 NOTE — Plan of Care (Signed)
Request for possible L1 kyphoplasty - request for CT thoracic/lumbar spine made yesterday for further evaluation by NIR.   Dr. Estanislado Pandy reviewed the CT from yesterday on this patient and he is concerned that the fracture is already healed which would mean that it would not be amenable to kyphoplasty. He is recommending a nuclear medicine bone scan and if there is uptake then we could proceed with KP, but if there is no uptake then it is healed and we would not be able to offer KP.  Nuclear medicine scan ordered today.  NIR will follow for results and discuss if fracture is amenable to KP.  Candiss Norse, PA-C

## 2022-08-11 NOTE — Progress Notes (Signed)
   08/11/22 1000  Mobility  Activity Ambulated with assistance in hallway  Level of Assistance Contact guard assist, steadying assist  Assistive Device Front wheel walker  Distance Ambulated (ft) 80 ft  Activity Response Tolerated well  Mobility Referral Yes  $Mobility charge 1 Mobility   Mobility Specialist Progress Note  Pt was in bed and agreeable. X2 standing breaks d/t c/o back pain. Returned to bed w/ all needs met and call bell in reach  [place] Specialist  Please contact via Solicitor or Rehab office at [phone]

## 2022-08-12 ENCOUNTER — Inpatient Hospital Stay (HOSPITAL_COMMUNITY): Payer: Medicare Other

## 2022-08-12 DIAGNOSIS — K921 Melena: Secondary | ICD-10-CM | POA: Diagnosis not present

## 2022-08-12 LAB — TYPE AND SCREEN
ABO/RH(D): AB POS
Antibody Screen: NEGATIVE
Unit division: 0

## 2022-08-12 LAB — BPAM RBC
Blood Product Expiration Date: 202404092359
ISSUE DATE / TIME: 202403191235
Unit Type and Rh: 6200

## 2022-08-12 LAB — BASIC METABOLIC PANEL
Anion gap: 7 (ref 5–15)
BUN: 31 mg/dL — ABNORMAL HIGH (ref 8–23)
CO2: 22 mmol/L (ref 22–32)
Calcium: 8.1 mg/dL — ABNORMAL LOW (ref 8.9–10.3)
Chloride: 112 mmol/L — ABNORMAL HIGH (ref 98–111)
Creatinine, Ser: 2.67 mg/dL — ABNORMAL HIGH (ref 0.61–1.24)
GFR, Estimated: 22 mL/min — ABNORMAL LOW (ref >60)
Glucose, Bld: 113 mg/dL — ABNORMAL HIGH (ref 70–99)
Potassium: 4.3 mmol/L (ref 3.5–5.1)
Sodium: 141 mmol/L (ref 135–145)

## 2022-08-12 LAB — CBC
HCT: 24.6 % — ABNORMAL LOW (ref 39.0–52.0)
Hemoglobin: 8.1 g/dL — ABNORMAL LOW (ref 13.0–17.0)
MCH: 31.5 pg (ref 26.0–34.0)
MCHC: 32.9 g/dL (ref 30.0–36.0)
MCV: 95.7 fL (ref 80.0–100.0)
Platelets: 65 10*3/uL — ABNORMAL LOW (ref 150–400)
RBC: 2.57 MIL/uL — ABNORMAL LOW (ref 4.22–5.81)
RDW: 15.4 % (ref 11.5–15.5)
WBC: 3 10*3/uL — ABNORMAL LOW (ref 4.0–10.5)
nRBC: 0 % (ref 0.0–0.2)

## 2022-08-12 LAB — GLUCOSE, CAPILLARY
Glucose-Capillary: 124 mg/dL — ABNORMAL HIGH (ref 70–99)
Glucose-Capillary: 154 mg/dL — ABNORMAL HIGH (ref 70–99)
Glucose-Capillary: 119 mg/dL — ABNORMAL HIGH (ref 70–99)
Glucose-Capillary: 190 mg/dL — ABNORMAL HIGH (ref 70–99)

## 2022-08-12 MED ORDER — TECHNETIUM TC 99M MEDRONATE IV KIT
20.0000 | PACK | Freq: Once | INTRAVENOUS | Status: AC | PRN
  Administered 2022-08-12: 21.3 via INTRAVENOUS

## 2022-08-12 NOTE — Progress Notes (Signed)
PROGRESS NOTE  Dean Murphy  DOB: 29-May-1926  PCP: Dean Ghent, MD GJ:[id number]  DOA: 08/07/2022  LOS: 4 days  Hospital Day: 6  Brief narrative: Dean Murphy is a 89 y.o. male with PMH significant for DM2, HTN, HLD, PAD s/p bilateral CEA, CAD/CABG, A-fib, CHB s/p PPM 2016, CVA on Eliquis, CHF, chronic thrombocytopenia 3/15, patient presented to the ED with complaint of dark stool, and progressive weakness Patient was seen by his PCP few days prior for 3 to 4 weeks history of black stool and a drop in hemoglobin to 9.3 from a baseline of 12.4.  He was recommended to hold Eliquis and stop ibuprofen despite which dark stool continued and hence sent to the ED.  On initial workup, hemoglobin was low at 8.7, FOBT positive Started on IV Protonix Admitted to [facility]. GI consulted  Subjective: Patient was seen and examined this morning.   Sitting up at the edge of the bed.  Not in distress.  Son at bedside.  Pending bone scan today.  Labs from this morning showed improvement in hemoglobin after blood transfusion yesterday.    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  Biopsies taken. Recommended to avoid NSAIDs.  Continue PPI twice daily. Plan to resume Eliquis after 5 days of colonoscopy, 3/23  Acute on chronic blood loss anemia Iron deficiency anemia Hemoglobin at baseline 13.4 from July 2023.  Presented with a low hemoglobin of 7.5 secondary to GI bleeding.  Anemia panel showed low ferritin level.  Patient probably had slow insidious GI bleeding in the span of the last several months. IV iron 1 dose was given.  Oral iron supplement started. Hemoglobin was lowest at 7 on 3/19.   1 unit PRBC transfused.  Improved to 8.1 today.   Recent Labs    12/18/21 0937 01/20/22 1354 08/04/22 1142 08/07/22 1238 08/09/22 0930 08/09/22 1742 08/10/22 0301 08/11/22 0110 08/12/22 0839  HGB 13.4   < > 9.3*   < > 7.3* 8.6* 7.2* 7.0* 8.1*  MCV 92.9   < > 96.2   < > 95.6 96.7 97.4 96.8 95.7  FERRITIN  --   --  20.6*  --   --   --   --   --   --   IRON 89  --  148  --   --   --   --   --   --    < > = values in this interval not displayed.    Intractable back pain  Apparently had a fall in August 2023 leading to L1 and L2 compression fractures with mild height loss as evidenced on CT 12/2021  Currently on scheduled Tylenol, as needed Roxicodone and topical lidocaine patch PRN On my evaluation, patient states that his quality of life has been significantly affected by the back pain.  He is open to the idea of kyphoplasty.  IR consulted.  Pending bone scan today.  Acute on chronic thrombocytopenia Acute drop of platelet likely due to consumption due to active bleeding Low but stable platelet count.  No need of platelet transfusion at this time. Recent Labs  Lab 08/07/22 2025 08/08/22 0218 08/08/22 1018 08/08/22 1751 08/09/22 0323 08/09/22 0930 08/09/22 1742 08/10/22 0301 08/11/22  0110 08/12/22 0839  PLT 72* 66* 66* 70* 70* 63* 78* 61* 65* 65*    Hyperkalemia K 6.4 on presentation.   Losartan on hold.  1.  Improved with Lokelma. Continue to monitor. Recent Labs  Lab 08/08/22 0218 08/08/22 0633 08/09/22 0930 08/09/22 1742 08/10/22 0301 08/11/22 0110 08/12/22 0839  K 5.8*   < > 5.2* 4.8 5.2* 5.1 4.3  MG 2.4  --   --   --   --   --   --    < > = values in this interval not displayed.    CKD 4 Chronic metabolic acidosis Baseline creatinine close to 2.5.  Slightly worse than baseline today.  Continue to monitor. Serum bicarb level improving on sodium bicarb supplement.  Recent Labs    08/08/22 0218 08/08/22 0633 08/08/22 1018 08/08/22 1725  08/09/22 0323 08/09/22 0930 08/09/22 1742 08/10/22 0301 08/11/22 0110 08/12/22 0839  BUN 42* 40* 39* 36* 35* 33* 32* 30* 27* 31*  CREATININE 2.53* 2.61* 2.46* 2.39* 2.42* 2.37* 2.48* 2.39* 2.52* 2.67*  CO2 18* 18* 17* 19* 20* 23 21* 23 24 22     Type 2 diabetes mellitus A1c 6.3 on 08/04/2022 PTA on glipizide twice daily, Lantus 34 units daily, Januvia 50 mg daily Currently blood sugar level is stable on Semglee 15 units daily and sliding scale insulin with Accu-Cheks. Recent Labs  Lab 08/11/22 1146 08/11/22 1631 08/11/22 2146 08/12/22 0913 08/12/22 1158  GLUCAP 148* 110* 215* 124* 154*    Chronic diastolic CHF Essential hypertension Currently euvolemic. PTA on Toprol, Demadex Continue Toprol 25 mg nightly.  Diuretics on hold.  Continue to hold for now given rising creatinine..  Cerebrovascular diseases HLD, PAD s/p bilateral CEA, CAD/CABG, CVA on Eliquis No anginal symptoms at this time Continue Crestor.   Eliquis plan as above  A-fib, CHB s/p PPM 2016 Currently on Toprol 25 mg nightly Eliquis plan as above   Mobility: PT eval appreciated  Goals of care   Code Status: Full Code     DVT prophylaxis:  Place and maintain sequential compression device Start: 08/08/22 1005   Antimicrobials: None currently Fluid: None Consultants: GI, IR Family Communication: Son at bedside  Status: Inpatient Level of care:  Telemetry   Needs to continue in-hospital care:  Pending bone scan today with possible need of kyphoplasty  Prognosis:  Guarded given muscle deconditioning and impaired mobility due to back pain  Patient from: Home Anticipated d/c to: Pending possible need of kyphoplasty    Scheduled Meds:  acetaminophen  1,000 mg Oral TID   ferrous sulfate  325 mg Oral Q breakfast   insulin aspart  0-9 Units Subcutaneous TID WC   insulin glargine-yfgn  15 Units Subcutaneous Daily   lidocaine  1 patch Transdermal Q24H   lubiprostone  8 mcg Oral Q breakfast    melatonin  3 mg Oral QHS   metoprolol succinate  25 mg Oral QHS   pantoprazole  40 mg Oral BID   rosuvastatin  20 mg Oral Daily   sodium bicarbonate  650 mg Oral BID   sodium chloride flush  3 mL Intravenous Q12H    PRN meds: morphine injection, oxyCODONE, polyethylene glycol   Infusions:   ferric gluconate (FERRLECIT) IVPB     lactated ringers 10 mL/hr at 08/11/22 2112    Diet:  Diet Order             Diet regular Room service appropriate? Yes; Fluid consistency: Thin  Diet effective now  Antimicrobials: Anti-infectives (From admission, onward)    None       Skin assessment:       Nutritional status:  Body mass index is 27.33 kg/m.          Objective: Vitals:   08/12/22 0318 08/12/22 0912  BP: (!) 119/51 136/63  Pulse: (!) 58 62  Resp: 16 18  Temp: 98 F (36.7 C) 97.9 F (36.6 C)  SpO2: 97% 99%    Intake/Output Summary (Last 24 hours) at 08/12/2022 1309 Last data filed at 08/12/2022 1016 Gross per 24 hour  Intake 1071.36 ml  Output --  Net 1071.36 ml    Filed Weights   08/10/22 0420 08/11/22 0500 08/12/22 0435  Weight: 88.5 kg 89 kg 88.9 kg   Weight change: -0.1 kg Body mass index is 27.33 kg/m.   Physical Exam: General exam: Pleasant, elderly male.  Lying down in bed.  Mild distress because of back pain with ambulation. Skin: No rashes, lesions or ulcers. HEENT: Atraumatic, normocephalic, no obvious bleeding Lungs: Clear to auscultation bilaterally CVS: Regular rate and rhythm, no murmur GI/Abd: soft, nontender, nondistended, bowel sound present CNS: Alert, awake, oriented x 3 Psychiatry: Mood appropriate Extremities: No pedal edema, no calf tenderness  Data Review: I have personally reviewed the laboratory data and studies available.  F/u labs ordered Unresulted Labs (From admission, onward)     Start     Ordered   08/13/22 0500  CBC with Differential/Platelet  Daily,   R      08/12/22 0815   08/13/22  [id number]  Basic metabolic panel  Daily,   R      08/12/22 0815            Total time spent in review of labs and imaging, patient evaluation, formulation of plan, documentation and communication with family: 75 minutes  Signed, Terrilee Croak, MD [facility] 08/12/2022

## 2022-08-12 NOTE — Consult Note (Signed)
Chief Complaint: Patient was seen in consultation today for L1 compression fracture.  Referring Physician(s):Dahal, Marlowe Aschoff, MD  Supervising Physician: Luanne Bras  Patient Status: [facility] - In-pt  History of Present Illness: Dean Murphy is a 89 y.o. male with a past medical history significant for anemia, CAD s/p CABG, PVD, CVA, a.fib on Eliquis, complete heart block s/p pacemaker, HTN, HLD, CHF, GI bleed, GERD, DM, CKD III who presented to [facility] ED on 08/07/22 with complaints of dark stool and weakness. He was found to have hgb 8.7 and FOBT (+). He was admitted and started on IV Protonix, GI was consulted and patient underwent EGD/colonscopy which showed 2 colonic angiectasia treated with APC as well as several polyps and non bleeding internal hemorrhoids. He was also noted to have intractable back pain stemming from a fall in August of 2023. He was found to have compression fractures at L1 and L2 with mild height loss on CT following this fall and conservative management was recommended at that time. He has tried multiple oral medications, topical medications and bracing without relief. IR has been consulted for possible L1/L2 kyphoplasty.  Patient seen in his room this afternoon, wife and son present during discussion. He tells me that his back pain as severely affected his quality of life on a daily basis since August and has progressively worsened causing him to require assistance with most ADLs. 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 He states the pain is in his lower back and when he moves around too much "spreads from my head to my toes." He has a hard time sleeping due to pain. He denies any weakness or numbness in his legs but does note that it feels like his legs will give out very quickly due to pain and he has to sit down frequently when he did not have to do this before. He has not found relief with rest or conservative management and is interested in kyphoplasty.  Past Medical History:   Diagnosis Date   Anemia    Arthritis    CAD (coronary artery disease)    a. CABG 1989, b. Myoview low risk 2012.   Carotid artery occlusion    a. Duplex 10/2014: patent R/L CEA with mild hyperplasia in right surgical bulb - followed by vascular.   CHB (complete heart block) [facility]) March 2016   a. s/p STJ dual chamber pacemaker 07/2014.   Chronic diastolic CHF (congestive heart failure) ([place])    a. Dx [id number] - acute diastolic CHF in the setting of CHB.   Chronic kidney disease, stage IV (severe) (HCC)    stage III/IV as of 2015, [name]   Colon polyps    Complication of anesthesia    Constipation    CVA (cerebral infarction)    Diabetes mellitus    type II   Diverticulosis    Dupuytren's disease    finger right hand contracted   Essential hypertension    GERD (gastroesophageal reflux disease)    Hemorrhoids    Hyperlipidemia    Myocardial infarction ([place]) 1976  and Mar. 19, 2016   Orthostasis    Osteoporosis    PAF (paroxysmal atrial fibrillation) ([place]) March 2016   PONV (postoperative nausea and vomiting)    Presence of permanent cardiac pacemaker    Shortness of breath dyspnea    due to pain    Past Surgical History:  Procedure Laterality Date   CARDIAC CATHETERIZATION  08/11/2014   Procedure: TEMPORARY PACEMAKER;  Surgeon: Roderic Palau  Adora Fridge, MD;  Location: [place] CATH LAB;  Service: Cardiovascular;;   CAROTID ENDARTERECTOMY  12/31/10   Right   CAROTID ENDARTERECTOMY  02/09/11   left   CHOLECYSTECTOMY     COLONOSCOPY WITH PROPOFOL N/A 08/10/2022   Procedure: COLONOSCOPY WITH PROPOFOL;  Surgeon: Mauri Pole, MD;  Location: [place];  Service: Gastroenterology;  Laterality: N/A;   CORONARY ARTERY BYPASS GRAFT  1989   ENDOSCOPIC RETROGRADE CHOLANGIOPANCREATOGRAPHY (ERCP) WITH PROPOFOL N/A 01/27/2022   Procedure: ENDOSCOPIC RETROGRADE CHOLANGIOPANCREATOGRAPHY (ERCP) WITH PROPOFOL;  Surgeon: Ladene Artist, MD;  Location: [facility];  Service:  Gastroenterology;  Laterality: N/A;   ESOPHAGOGASTRODUODENOSCOPY N/A 08/09/2022   Procedure: ESOPHAGOGASTRODUODENOSCOPY (EGD);  Surgeon: Doran Stabler, MD;  Location: [place];  Service: Gastroenterology;  Laterality: N/A;   EYE SURGERY Bilateral    Cataract with implants   HOT HEMOSTASIS N/A 08/10/2022   Procedure: HOT HEMOSTASIS (ARGON PLASMA COAGULATION/BICAP);  Surgeon: Mauri Pole, MD;  Location: [place];  Service: Gastroenterology;  Laterality: N/A;   I & D KNEE WITH POLY EXCHANGE Left 07/29/2015   Procedure: IRRIGATION AND DEBRIDEMENT LEFT KNEE WITH POLY EXCHANGE;  Surgeon: Rod Can, MD;  Location: [place];  Service: Orthopedics;  Laterality: Left;   IR GENERIC HISTORICAL  03/04/2016   IR US GUIDE VASC ACCESS RIGHT 03/04/2016 Marybelle Killings, MD [facility]-INTER[facility] RAD   IR GENERIC HISTORICAL  03/04/2016   IR FLUORO GUIDE CV LINE RIGHT 03/04/2016 Marybelle Killings, MD [facility]   JOINT REPLACEMENT     bilat. knees   PERMANENT PACEMAKER INSERTION N/A 08/13/2014   STJ dual chamber pacemaker implanted by [name] for [place]  01/27/2022   Procedure: REMOVAL OF STONES;  Surgeon: Ladene Artist, MD;  Location: [facility];  Service: Gastroenterology;;   Joan Mayans  01/27/2022   Procedure: Joan Mayans;  Surgeon: Ladene Artist, MD;  Location: [facility];  Service: Gastroenterology;;   TONSILLECTOMY     TOTAL KNEE ARTHROPLASTY     bilateral    Allergies: Zoledronic acid, Lokelma [sodium zirconium cyclosilicate], and Nsaids  Medications: Prior to Admission medications   Medication Sig Start Date End Date Taking? Authorizing Provider  amoxicillin (AMOXIL) 500 MG capsule Take 1 capsule (500 mg total) by mouth daily. 07/22/22  Yes Michel Bickers, MD  apixaban Arne [place]) 2.5 MG TABS tablet Held as of 08/05/22 Patient taking differently: Take 2.5 mg by mouth 2 (two) times daily. Held as of 08/05/22 08/05/22  Yes Tonia Ghent, MD  cholecalciferol (VITAMIN  D3) 25 MCG (1000 UNIT) tablet Take 1,000 Units by mouth daily.   Yes [provider]  clotrimazole-betamethasone (LOTRISONE) cream APPLY TO AFFECTED AREA TWICE A DAY Patient taking differently: Apply 1 Application topically 2 (two) times daily. 07/09/21  Yes Tonia Ghent, MD  dorzolamide-timolol (COSOPT) 22.3-6.8 MG/ML ophthalmic solution Place 1 drop into both eyes daily in the afternoon. In both eye 01/16/22  Yes [provider]  ferrous sulfate 325 (65 FE) MG tablet TAKE 1 TABLET BY MOUTH ON FRIDAY Patient taking differently: Take 325 mg by mouth once a week. 12/17/20  Yes Tonia Ghent, MD  glipiZIDE (GLUCOTROL) 5 MG tablet TAKE 2 TABLETS BY MOUTH EVERY MORNING BEFORE BREAKFAST AND 1 TABLET BEFORE SUPPER Patient taking differently: Take 5 mg by mouth daily before breakfast. 09/22/21  Yes Tonia Ghent, MD  insulin glargine (LANTUS SOLOSTAR) 100 UNIT/ML Solostar Pen INJECT 34 UNITS INTO THE SKIN DAILY 06/18/22  Yes Tonia Ghent,  MD  losartan (COZAAR) 100 MG tablet Held as of 08/05/22 Patient taking differently: Take 100 mg by mouth daily. 08/05/22  Yes Tonia Ghent, MD  lubiprostone (AMITIZA) 8 MCG capsule TAKE ONE CAPSULE BY MOUTH [facility] MORNING Patient taking differently: Take 8 mcg by mouth daily with breakfast. 01/27/22  Yes Tonia Ghent, MD  metoprolol succinate (TOPROL XL) 25 MG 24 hr tablet Take 1 tablet (25 mg total) by mouth at bedtime. 06/18/22  Yes Freada Bergeron, MD  omeprazole (PRILOSEC) 40 MG capsule TAKE 1 CAPSULE BY MOUTH EVERY DAY Patient taking differently: Take 40 mg by mouth daily. 06/18/22  Yes Tonia Ghent, MD  polyethylene glycol powder (GLYCOLAX/MIRALAX) 17 GM/SCOOP powder Take 17 g by mouth daily as needed. Patient taking differently: Take 17 g by mouth daily as needed for mild constipation. 04/20/22  Yes Tonia Ghent, MD  rosuvastatin (CRESTOR) 20 MG tablet Take 1 tablet (20 mg total) by mouth daily. 08/18/21  Yes Freada Bergeron, MD  torsemide (DEMADEX) 10 MG tablet Take 1 tablet (10 mg total) by mouth 2 (two) times a week. Monday and Friday 12/18/21  Yes Tonia Ghent, MD  B-D UF III MINI PEN NEEDLES 31G X 5 MM MISC USE DAILY WITH INSULIN PEN 08/18/21   Tonia Ghent, MD  JANUVIA 50 MG tablet TAKE 1 TABLET BY MOUTH EVERY DAY Patient taking differently: Take 50 mg by mouth daily. 03/16/22   Tonia Ghent, MD     Family History  Problem Relation Age of Onset   Heart disease Mother        Before age 33   Diabetes Mother    Varicose Veins Mother    Heart attack Mother    Heart attack Father    Heart disease Father        After age 66   Hypertension Father    Heart disease Brother        Before age 49   Hypertension Brother    Heart attack Brother    Diabetes Son    Hypertension Son    Hypertension Son    Diabetes Son    Diabetes Other    Cancer Other    Colon cancer Neg Hx     Social History   Socioeconomic History   Marital status: Married    Spouse name: Not on file   Number of children: Not on file   Years of education: Not on file   Highest education level: Not on file  Occupational History   Occupation: retired    Fish farm manager: RETIRED  Tobacco Use   Smoking status: Former    Packs/day: 1.00    Years: 25.00    Additional pack years: 0.00    Total pack years: 25.00    Types: Cigarettes    Quit date: 01/21/1973    Years since quitting: 49.5    Passive exposure: Past   Smokeless tobacco: Never   Tobacco comments:    began smoking in high school, quit age 85  Vaping Use   Vaping Use: Never used  Substance and Sexual Activity   Alcohol use: Yes    Alcohol/week: 0.0 standard drinks of alcohol    Comment: wine occ   Drug use: No   Sexual activity: Never  Other Topics Concern   Not on file  Social History Narrative   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  Remarried 1998.  3 sons   Social Determinants of Health   Financial  Resource Strain: Low Risk  (12/15/2021)   Overall Financial Resource Strain (CARDIA)    Difficulty of Paying Living Expenses: Not hard at all  Food Insecurity: No Food Insecurity (08/07/2022)   Hunger Vital Sign    Worried About Running Out of Food in the Last Year: Never true    Ran Out of Food in the Last Year: Never true  Transportation Needs: No Transportation Needs (08/07/2022)   PRAPARE - Hydrologist (Medical): No    Lack of Transportation (Non-Medical): No  Physical Activity: Inactive (12/15/2021)   Exercise Vital Sign    Days of Exercise per Week: 0 days    Minutes of Exercise per Session: 0 min  Stress: No Stress Concern Present (12/15/2021)   [place]    Feeling of Stress : Not at all  Social Connections: Not on file     Review of Systems: A 12 point ROS discussed and pertinent positives are indicated in the HPI above.  All other systems are negative.  Review of Systems  Constitutional:  Negative for chills and fever.  Respiratory:  Negative for cough and shortness of breath.   Cardiovascular:  Negative for chest pain.  Gastrointestinal:  Negative for abdominal pain, diarrhea, nausea and vomiting.  Musculoskeletal:  Positive for back pain, gait problem and neck pain.  Neurological:  Negative for dizziness, syncope, weakness and numbness.    Vital Signs: BP 136/63 (BP Location: Right Arm)   Pulse 62   Temp 97.9 F (36.6 C) (Oral)   Resp 18   Ht 5\' 11"  (1.803 m)   Wt 195 lb 15.8 oz (88.9 kg)   SpO2 99%   BMI 27.33 kg/m   Physical Exam Vitals and nursing note reviewed.  Constitutional:      General: He is not in acute distress. HENT:     Head: Normocephalic.     Mouth/Throat:     Mouth: Mucous membranes are moist.     Pharynx: Oropharynx is clear. No oropharyngeal exudate or posterior oropharyngeal erythema.  Cardiovascular:     Rate and Rhythm: Normal rate and  regular rhythm.  Pulmonary:     Effort: Pulmonary effort is normal.     Breath sounds: Normal breath sounds.  Abdominal:     General: There is no distension.     Palpations: Abdomen is soft.     Tenderness: There is no abdominal tenderness.  Musculoskeletal:     Comments: Able to sit up in bed with minimal assistance, clearly in pain with movement Point tenderness noted in low back at approximate level of L1 with palpable firmness at this area and extending down the lower back to just above sacrum with decreasing intensity. Significantly tender at expected approximate L1 level.  Skin:    General: Skin is warm and dry.  Neurological:     Mental Status: He is alert and oriented to person, place, and time.  Psychiatric:        Mood and Affect: Mood normal.        Behavior: Behavior normal.        Thought Content: Thought content normal.        Judgment: Judgment normal.      MD Evaluation Airway: WNL Heart: WNL Abdomen: WNL Chest/ Lungs: WNL ASA  Classification: 3 Mallampati/Airway Score: Two   Imaging: NM Bone Scan Whole Body  Result  Date: 08/12/2022 CLINICAL DATA:  Evaluate acuity of L1 and L2 compression fractures. EXAM: NUCLEAR MEDICINE WHOLE BODY BONE SCAN TECHNIQUE: Whole body anterior and posterior images were obtained approximately 3 hours after intravenous injection of radiopharmaceutical. RADIOPHARMACEUTICALS:  21.3 mCi Technetium-26m MDP IV COMPARISON:  Lumbar spine CT-08/10/2022; 01/02/2022 FINDINGS: Abnormal radiotracer uptake is seen within the superior endplate of the L1 vertebral body indicative of an acute on chronic process and residual edema. There is no definitive abnormal radiotracer activity involving the L2 vertebral body, findings suggestive of healing. Radiotracer activity is noted at the level of the right wrist, presumably degenerative in etiology. No additional abnormal areas of radiotracer uptake. Photopenic defects compatible with history of bilateral  total knee replacements. IMPRESSION: 1. Uptake within the superior endplate of the L1 vertebral body suggestive of an acute on chronic compression fracture with residual edema. This finding suggests L1 may be amenable to attempted vertebral body cement augmentation as indicated. 2. No definitive abnormal radiotracer uptake involving the L2 vertebral body, findings suggestive of complete healing. Electronically Signed   By: Sandi Mariscal M.D.   On: 08/12/2022 14:34   CT LUMBAR SPINE WO CONTRAST  Result Date: 08/11/2022 CLINICAL DATA:  Initial evaluation for compression fracture. EXAM: CT LUMBAR SPINE WITHOUT CONTRAST TECHNIQUE: Multidetector CT imaging of the lumbar spine was performed without intravenous contrast administration. Multiplanar CT image reconstructions were also generated. RADIATION DOSE REDUCTION: This exam was performed according to the departmental dose-optimization program which includes automated exposure control, adjustment of the mA and/or kV according to patient size and/or use of iterative reconstruction technique. COMPARISON:  Prior radiograph from 08/04/2022 as well as prior CT from 01/02/2022. FINDINGS: Segmentation: Standard. Lowest well-formed disc space labeled the L5-S1 level. Alignment: Chronic bilateral pars defects at L5 with associated 4 mm spondylolisthesis, stable. Alignment otherwise normal preservation of the normal lumbar lordosis. Vertebrae: Mild compression deformities involving the L1 and L2 vertebral bodies, relatively stable as compared to prior CT from 01/02/2022, consistent with chronic compression fractures. Vertebral body height otherwise maintained with no other acute or recent fracture. Visualized sacrum and pelvis intact. No worrisome osseous lesions. Paraspinal and other soft tissues: Paraspinous soft tissues demonstrate no acute finding. Advanced aorto bi-iliac atherosclerotic disease. Colonic diverticulosis noted. 1.5 cm left adrenal myelolipoma noted as well,  benign with no follow-up imaging recommended. Disc levels: T12-L1: Disc desiccation with minimal bulge. Trace bony retropulsion related to the chronic L1 compression fracture. Bilateral facet hypertrophy. No significant stenosis. L1-2: Disc desiccation without significant disc bulge. Mild facet hypertrophy. No stenosis. L2-3: Mild disc bulge with endplate spurring. Mild to moderate facet hypertrophy. Resultant mild spinal stenosis. Mild bilateral foraminal narrowing. L3-4: Mild disc bulge. Moderate facet and ligament flavum hypertrophy. Resultant mild-to-moderate spinal stenosis. Mild bilateral foraminal narrowing. L4-5: Mild diffuse disc bulge. Superimposed left subarticular disc protrusion. Moderate bilateral facet hypertrophy. Resultant moderate to severe left with mild right lateral recess stenosis. Central canal remains patent. Mild to moderate right with moderate left L4 foraminal narrowing. L5-S1: Chronic bilateral pars defects with associated 4 mm spondylolisthesis. Mild disc bulge with disc desiccation, asymmetric to the right. Moderate facet hypertrophy. No significant spinal stenosis. Severe right with moderate left L5 foraminal narrowing. IMPRESSION: 1. No acute osseous abnormality within the lumbar spine. 2. Mild chronic compression deformities involving the L1 and L2 vertebral bodies, relatively stable as compared to most recent CT from 01/02/2022. 3. Chronic bilateral pars defects at L5 with associated 4 mm spondylolisthesis, with resultant severe right and moderate left L5 foraminal  stenosis. 4. Left subarticular disc protrusion at L4-5 with resultant moderate to severe left lateral recess stenosis. Aortic Atherosclerosis (ICD10-I70.0). Electronically Signed   By: Jeannine Boga M.D.   On: 08/11/2022 05:00   CT THORACIC SPINE WO CONTRAST  Result Date: 08/11/2022 CLINICAL DATA:  Initial evaluation for compression fracture. EXAM: CT THORACIC SPINE WITHOUT CONTRAST TECHNIQUE: Multidetector CT  images of the thoracic were obtained using the standard protocol without intravenous contrast. RADIATION DOSE REDUCTION: This exam was performed according to the departmental dose-optimization program which includes automated exposure control, adjustment of the mA and/or kV according to patient size and/or use of iterative reconstruction technique. COMPARISON:  None Available. FINDINGS: Alignment: Physiologic with preservation of the normal thoracic kyphosis. No listhesis. Vertebrae: Vertebral body height maintained without acute or chronic fracture. Visualized ribs intact. No worrisome osseous lesions. Paraspinal and other soft tissues: Paraspinous soft tissues demonstrate no acute finding. Aortic atherosclerosis. Pacemaker/AICD partially visualized. Mild dependent atelectatic changes noted within the visualized lungs. Small hiatal hernia noted. Disc levels: Diffusely flowing bridging osteophytic spurring seen throughout the thoracic spine, compatible with DISH. No significant disc bulge or focal disc herniation visible by CT. No spinal stenosis. IMPRESSION: No acute or recent osseous injury within the thoracic spine. Aortic Atherosclerosis (ICD10-I70.0). Electronically Signed   By: Jeannine Boga M.D.   On: 08/11/2022 04:47   DG Lumbar Spine 2-3 Views  Result Date: 08/06/2022 CLINICAL DATA:  Midline lower back pain, history of fall EXAM: LUMBAR SPINE - 2-3 VIEW COMPARISON:  12/18/2021, 01/02/2022 FINDINGS: Frontal and lateral views of the lumbar spine are obtained. There are 5 non-rib-bearing lumbar type vertebral bodies, with stable grade 1 anterolisthesis of L5 on S1. Mild anterior wedging of the L1 and L2 vertebral bodies again noted unchanged. No acute lumbar spine fractures. Stable multilevel spondylosis and facet hypertrophy greatest at L4-5 and L5-S1. Diffuse atherosclerosis. Sacroiliac joints are normal. IMPRESSION: 1. Stable mild L1 and L2 anterior wedge compression deformities. 2. Stable lower  lumbar degenerative changes, with stable grade 1 anterolisthesis of L5 on S1. Electronically Signed   By: Randa Ngo M.D.   On: 08/06/2022 23:58   CUP PACEART REMOTE DEVICE CHECK  Result Date: 07/29/2022 Scheduled remote reviewed. Normal device function.  Hx of PAF, controlled rates, burden <1%, Eliquis Next remote 91 days. LA   Labs:  CBC: Recent Labs    08/09/22 1742 08/10/22 0301 08/11/22 0110 08/12/22 0839  WBC 5.0 3.0* 3.5* 3.0*  HGB 8.6* 7.2* 7.0* 8.1*  HCT 26.1* 22.4* 21.2* 24.6*  PLT 78* 61* 65* 65*    COAGS: No results for input(s): "INR", "APTT" in the last 8760 hours.  BMP: Recent Labs    08/09/22 1742 08/10/22 0301 08/11/22 0110 08/12/22 0839  NA 141 141 141 141  K 4.8 5.2* 5.1 4.3  CL 107 112* 112* 112*  CO2 21* 23 24 22   GLUCOSE 147* 124* 139* 113*  BUN 32* 30* 27* 31*  CALCIUM 9.4 8.6* 8.4* 8.1*  CREATININE 2.48* 2.39* 2.52* 2.67*  GFRNONAA 24* 25* 23* 22*    LIVER FUNCTION TESTS: Recent Labs    04/20/22 0945 08/04/22 1142 08/07/22 1238 08/08/22 0218  BILITOT 0.5 0.4 0.8 0.6  AST 35 23 27 23   ALT 25 22 27 25   ALKPHOS 120* 98 96 78  PROT 6.4 6.0 5.9* 5.0*  ALBUMIN 3.8 3.6 3.4* 2.8*    TUMOR MARKERS: No results for input(s): "AFPTM", "CEA", "CA199", "CHROMGRNA" in the last 8760 hours.  Assessment and Plan:  89 y/o M admitted 08/07/22 with GI bleeding (now resolved) found to have intractable back pain stemming from fall and resultant L1 and L2 compression fractures in August 2023. He has tried conservative management without improvement in symptoms and has had worsening pain which affects his ADLs and overall quality of life. IR has been consulted for possible kyphoplasty.  Bone scan reviewed by Dr. Estanislado Pandy today who notes uptake in the L1 vertebral body making this amenable to kyphoplasty. There is no uptake in L2 making this not amenable to kyphoplasty as this indicates the fracture has already healed.   Discussed bone scan results  as well as L1 kyphoplasty with patient and family today in detail -- they understand and are agreeable to proceed.  Plan: - L1 KP tenatively planned for 3/21 pending any emergent procedures. The patient and his family are aware that the procedure could potentially be delayed into early next week if there are emergent NIR procedures. They are very understanding of this and still wish to proceed. - NPO at midnight - CBC/INR AM of 3/21 - Hold anticoagulation until post procedure (none currently, LD Eliquis 3/14) - Ancef 2g signed and held, to be given in IR. Please do not give on the floor - IR will call for patient when ready - If procedure is delayed IR APP will communicate with primary team/patient as soon as possible  Risks and benefits of L1 kyphoplasty/vertebroplasty were discussed with the patient including, but not limited to education regarding the natural healing process of compression fractures without intervention, bleeding, infection, cement migration which may cause spinal cord damage, paralysis, pulmonary embolism or even death.  This interventional procedure involves the use of X-rays and because of the nature of the planned procedure, it is possible that we will have prolonged use of X-ray fluoroscopy.  Potential radiation risks to you include (but are not limited to) the following: - A slightly elevated risk for cancer  several years later in life. This risk is typically less than 0.5% percent. This risk is low in comparison to the normal incidence of human cancer, which is 33% for women and 50% for men according to the [place]. - Radiation induced injury can include skin redness, resembling a rash, tissue breakdown / ulcers and hair loss (which can be temporary or permanent).  The likelihood of either of these occurring depends on the difficulty of the procedure and whether you are sensitive to radiation due to previous procedures, disease, or genetic conditions.   IF your procedure requires a prolonged use of radiation, you will be notified and given written instructions for further action.  It is your responsibility to monitor the irradiated area for the 2 weeks following the procedure and to notify your physician if you are concerned that you have suffered a radiation induced injury.    All of the patient's questions were answered, patient is agreeable to proceed.  Consent signed and in chart.  Thank you for this interesting consult.  I greatly enjoyed meeting ENEDINO CORIO and look forward to participating in their care.  A copy of this report was sent to the requesting provider on this date.  Electronically Signed: Joaquim Nam, PA-C 08/12/2022, 4:05 PM   I spent a total of 25 Miinutes in face to face in clinical consultation, greater than 50% of which was counseling/coordinating care for L1 compression fracture.

## 2022-08-12 NOTE — Plan of Care (Signed)
  Problem: Education: Goal: Ability to describe self-care measures that may prevent or decrease complications (Diabetes Survival Skills Education) will improve Outcome: Progressing   Problem: Coping: Goal: Ability to adjust to condition or change in health will improve Outcome: Progressing   Problem: Health Behavior/Discharge Planning: Goal: Ability to identify and utilize available resources and services will improve Outcome: Progressing Goal: Ability to manage health-related needs will improve Outcome: Progressing   Problem: Nutritional: Goal: Maintenance of adequate nutrition will improve Outcome: Progressing Goal: Progress toward achieving an optimal weight will improve Outcome: Progressing   Problem: Tissue Perfusion: Goal: Adequacy of tissue perfusion will improve Outcome: Progressing   Problem: Education: Goal: Knowledge of General Education information will improve Description: Including pain rating scale, medication(s)/side effects and non-pharmacologic comfort measures Outcome: Progressing   Problem: Elimination: Goal: Will not experience complications related to bowel motility Outcome: Progressing Goal: Will not experience complications related to urinary retention Outcome: Progressing   Problem: Pain Managment: Goal: General experience of comfort will improve Outcome: Progressing

## 2022-08-13 ENCOUNTER — Inpatient Hospital Stay (HOSPITAL_COMMUNITY): Payer: Medicare Other

## 2022-08-13 HISTORY — PX: IR KYPHO LUMBAR INC FX REDUCE BONE BX UNI/BIL CANNULATION INC/IMAGING: IMG5519

## 2022-08-13 LAB — CBC WITH DIFFERENTIAL/PLATELET
Abs Immature Granulocytes: 0.01 10*3/uL (ref 0.00–0.07)
Basophils Absolute: 0 10*3/uL (ref 0.0–0.1)
Basophils Relative: 1 %
Eosinophils Absolute: 0.1 10*3/uL (ref 0.0–0.5)
Eosinophils Relative: 4 %
HCT: 24.2 % — ABNORMAL LOW (ref 39.0–52.0)
Hemoglobin: 7.9 g/dL — ABNORMAL LOW (ref 13.0–17.0)
Immature Granulocytes: 0 %
Lymphocytes Relative: 31 %
Lymphs Abs: 0.9 10*3/uL (ref 0.7–4.0)
MCH: 31.2 pg (ref 26.0–34.0)
MCHC: 32.6 g/dL (ref 30.0–36.0)
MCV: 95.7 fL (ref 80.0–100.0)
Monocytes Absolute: 0.2 10*3/uL (ref 0.1–1.0)
Monocytes Relative: 9 %
Neutro Abs: 1.6 10*3/uL — ABNORMAL LOW (ref 1.7–7.7)
Neutrophils Relative %: 55 %
Platelets: 67 10*3/uL — ABNORMAL LOW (ref 150–400)
RBC: 2.53 MIL/uL — ABNORMAL LOW (ref 4.22–5.81)
RDW: 14.7 % (ref 11.5–15.5)
WBC: 2.8 10*3/uL — ABNORMAL LOW (ref 4.0–10.5)
nRBC: 0 % (ref 0.0–0.2)

## 2022-08-13 LAB — BASIC METABOLIC PANEL
Anion gap: 6 (ref 5–15)
BUN: 32 mg/dL — ABNORMAL HIGH (ref 8–23)
CO2: 23 mmol/L (ref 22–32)
Calcium: 8.1 mg/dL — ABNORMAL LOW (ref 8.9–10.3)
Chloride: 113 mmol/L — ABNORMAL HIGH (ref 98–111)
Creatinine, Ser: 2.77 mg/dL — ABNORMAL HIGH (ref 0.61–1.24)
GFR, Estimated: 21 mL/min — ABNORMAL LOW (ref >60)
Glucose, Bld: 119 mg/dL — ABNORMAL HIGH (ref 70–99)
Potassium: 4.6 mmol/L (ref 3.5–5.1)
Sodium: 142 mmol/L (ref 135–145)

## 2022-08-13 LAB — GLUCOSE, CAPILLARY
Glucose-Capillary: 100 mg/dL — ABNORMAL HIGH (ref 70–99)
Glucose-Capillary: 116 mg/dL — ABNORMAL HIGH (ref 70–99)

## 2022-08-13 LAB — PROTIME-INR
INR: 1.3 — ABNORMAL HIGH (ref 0.8–1.2)
Prothrombin Time: 15.6 seconds — ABNORMAL HIGH (ref 11.4–15.2)

## 2022-08-13 MED ORDER — APIXABAN 2.5 MG PO TABS: 2.5000 mg | ORAL_TABLET | Freq: Two times a day (BID) | ORAL | Status: DC

## 2022-08-13 MED ORDER — SODIUM BICARBONATE 650 MG PO TABS: 650.0000 mg | ORAL_TABLET | Freq: Two times a day (BID) | ORAL | 0 refills | Status: AC

## 2022-08-13 MED ORDER — CEFAZOLIN SODIUM-DEXTROSE 2-4 GM/100ML-% IV SOLN
INTRAVENOUS | Status: AC | PRN
  Administered 2022-08-13: 2 g via INTRAVENOUS

## 2022-08-13 MED ORDER — ONDANSETRON HCL 4 MG/2ML IJ SOLN: 4.0000 mg | Freq: Four times a day (QID) | INTRAMUSCULAR | Status: DC | PRN

## 2022-08-13 MED ORDER — AMLODIPINE BESYLATE 5 MG PO TABS
5.0000 mg | ORAL_TABLET | Freq: Every day | ORAL | Status: DC
  Administered 2022-08-13: 5 mg via ORAL
  Filled 2022-08-13: qty 1

## 2022-08-13 MED ORDER — MIDAZOLAM HCL 2 MG/2ML IJ SOLN
INTRAMUSCULAR | Status: AC
  Filled 2022-08-13: qty 2

## 2022-08-13 MED ORDER — TOBRAMYCIN SULFATE 1.2 G IJ SOLR
INTRAMUSCULAR | Status: AC
  Filled 2022-08-13: qty 1.2

## 2022-08-13 MED ORDER — FENTANYL CITRATE (PF) 100 MCG/2ML IJ SOLN
INTRAMUSCULAR | Status: AC | PRN
  Administered 2022-08-13: 25 ug via INTRAVENOUS

## 2022-08-13 MED ORDER — PANTOPRAZOLE SODIUM 40 MG PO TBEC: 40.0000 mg | DELAYED_RELEASE_TABLET | Freq: Two times a day (BID) | ORAL | 0 refills | Status: DC

## 2022-08-13 MED ORDER — OXYCODONE HCL 5 MG PO TABS: 5.0000 mg | ORAL_TABLET | Freq: Four times a day (QID) | ORAL | 0 refills | Status: DC | PRN

## 2022-08-13 MED ORDER — FERROUS SULFATE 325 (65 FE) MG PO TABS: 325.0000 mg | ORAL_TABLET | Freq: Every day | ORAL | 0 refills | Status: DC

## 2022-08-13 MED ORDER — FENTANYL CITRATE (PF) 100 MCG/2ML IJ SOLN
INTRAMUSCULAR | Status: AC
  Filled 2022-08-13: qty 2

## 2022-08-13 MED ORDER — LIDOCAINE 4 % EX PTCH: 1.0000 | MEDICATED_PATCH | Freq: Every day | CUTANEOUS | 0 refills | Status: AC | PRN

## 2022-08-13 MED ORDER — SODIUM CHLORIDE 0.9 % IV SOLN: INTRAVENOUS | Status: DC

## 2022-08-13 MED ORDER — MIDAZOLAM HCL 2 MG/2ML IJ SOLN
INTRAMUSCULAR | Status: AC | PRN
  Administered 2022-08-13: 1 mg via INTRAVENOUS

## 2022-08-13 MED ORDER — LANTUS SOLOSTAR 100 UNIT/ML ~~LOC~~ SOPN: 15.0000 [IU] | PEN_INJECTOR | Freq: Every day | SUBCUTANEOUS | 3 refills | Status: DC

## 2022-08-13 MED ORDER — CEFAZOLIN SODIUM-DEXTROSE 2-4 GM/100ML-% IV SOLN
INTRAVENOUS | Status: AC
  Filled 2022-08-13: qty 100

## 2022-08-13 MED ORDER — BUPIVACAINE HCL (PF) 0.5 % IJ SOLN
INTRAMUSCULAR | Status: AC
  Filled 2022-08-13: qty 30

## 2022-08-13 MED ORDER — CEFAZOLIN SODIUM-DEXTROSE 1-4 GM/50ML-% IV SOLN: 1.0000 g | Freq: Once | INTRAVENOUS | Status: DC

## 2022-08-13 MED ORDER — ACETAMINOPHEN 500 MG PO TABS: 1000.0000 mg | ORAL_TABLET | Freq: Three times a day (TID) | ORAL | 0 refills | Status: DC

## 2022-08-13 MED ORDER — AMLODIPINE BESYLATE 5 MG PO TABS: 5.0000 mg | ORAL_TABLET | Freq: Every day | ORAL | 0 refills | Status: DC

## 2022-08-13 NOTE — Progress Notes (Addendum)
PROGRESS NOTE  Dean Murphy  DOB: August 16, 1926  PCP: Tonia Ghent, MD ZA:[id number]  DOA: 08/07/2022  LOS: 5 days  Hospital Day: 7  Brief narrative: Dean Murphy is a 89 y.o. male with PMH significant for DM2, HTN, HLD, PAD s/p bilateral CEA, CAD/CABG, A-fib, CHB s/p PPM 2016, CVA on Eliquis, CHF, chronic thrombocytopenia 3/15, patient presented to the ED with complaint of dark stool, and progressive weakness Patient was seen by his PCP few days prior for 3 to 4 weeks history of black stool and a drop in hemoglobin to 9.3 from a baseline of 12.4.  He was recommended to hold Eliquis and stop ibuprofen despite which dark stool continued and hence sent to the ED.  On initial workup, hemoglobin was low at 8.7, FOBT positive Started on IV Protonix Admitted to [facility] ([facility] GI consulted  Subjective: Patient was seen and examined this morning.   Propped up in bed.  Not in distress.  Multiple family members at bedside.  Patient is hoping he can get kyphoplasty done today.  Assessment and plan: Acute on chronic GI bleeding Presented with dark stools, weakness and drop in hemoglobin GI was consulted 3/17, EGD was normal except for small sliding hiatal hernia 3/18, colonoscopy showed 2 colonic angiectasia treated with APC, ascending colon and sigmoid colon polyp, sigmoid colon diverticulosis and nonbleeding internal hemorrhoids.  Biopsies taken. Recommended to avoid NSAIDs.  Continue PPI twice daily. Plan to resume Eliquis after 5 days of colonoscopy, 3/23.  It may need to be held longer if kyphoplasty gets postponed.  Acute on chronic blood loss anemia Iron deficiency anemia Hemoglobin at baseline 13.4 from July 2023.  Presented with a low hemoglobin of 7.5 secondary to GI bleeding.  Anemia panel showed low ferritin level.  Patient probably had slow insidious GI bleeding in the span of the last several months. IV iron 1 dose was given.  Oral iron supplement started. Hemoglobin was lowest at 7 on  3/19.  1 unit PRBC transfused.  Improved and stable after that. Recent Labs    12/18/21 0937 01/20/22 1354 08/04/22 1142 08/07/22 1238 08/09/22 1742 08/10/22 0301 08/11/22 0110 08/12/22 0839 08/13/22 0317  HGB 13.4   < > 9.3*   < > 8.6* 7.2* 7.0* 8.1* 7.9*  MCV 92.9   < > 96.2   < > 96.7 97.4 96.8 95.7 95.7  FERRITIN  --   --  20.6*  --   --   --   --   --   --   IRON 89  --  148  --   --   --   --   --   --    < > = values in this interval not displayed.   Intractable back pain  Apparently had a fall in August 2023 leading to L1 and L2 compression fractures with mild height loss as evidenced on CT 12/2021  Currently on scheduled Tylenol, as needed Roxicodone and topical lidocaine patch PRN On my evaluation, patient states that his quality of life has been significantly affected by the back pain.  He is open to the idea of kyphoplasty.  IR consulted.  Bone scan obtained. Kyphoplasty planned today by Dr. Floyce Stakes.  Acute on chronic thrombocytopenia Acute drop of platelet likely due to consumption due to active bleeding Low but stable platelet count.  No need of platelet transfusion at this time. Recent Labs  Lab 08/08/22 0218 08/08/22 1018 08/08/22 1751 08/09/22 0323 08/09/22 0930 08/09/22 1742 08/10/22  0301 08/11/22 0110 08/12/22 0839 08/13/22 0317  PLT 66* 66* 70* 70* 63* 78* 61* 65* 65* 67*   Hyperkalemia K was 6.4 on presentation.   Losartan was kept on hold.  Potassium level improved with Lokelma. Continue to monitor. Recent Labs  Lab 08/08/22 0218 08/08/22 0633 08/09/22 1742 08/10/22 0301 08/11/22 0110 08/12/22 0839 08/13/22 0317  K 5.8*   < > 4.8 5.2* 5.1 4.3 4.6  MG 2.4  --   --   --   --   --   --    < > = values in this interval not displayed.   Chronic diastolic CHF Essential hypertension Currently euvolemic. PTA on Toprol 25 mg daily,, losartan 100 mg daily, Demadex 10 mg twice a week Continue Toprol 25 mg nightly.  Losartan Demadex on hold.   Remains euvolemic. Blood pressure running elevated to 170s.  Will add amlodipine 5 mg daily.   CKD 4 Chronic metabolic acidosis Baseline creatinine close to 2.5.  Creatinine worsening for last 2 days.  Losartan and Demadex remain on hold. Serum bicarb level improving on sodium bicarb supplement.  Recent Labs    08/08/22 0633 08/08/22 1018 08/08/22 1725 08/09/22 0323 08/09/22 0930 08/09/22 1742 08/10/22 0301 08/11/22 0110 08/12/22 0839 08/13/22 0317  BUN 40* 39* 36* 35* 33* 32* 30* 27* 31* 32*  CREATININE 2.61* 2.46* 2.39* 2.42* 2.37* 2.48* 2.39* 2.52* 2.67* 2.77*  CO2 18* 17* 19* 20* 23 21* 23 24 22 23    Type 2 diabetes mellitus A1c 6.3 on 08/04/2022 PTA on glipizide twice daily, Lantus 34 units daily, Januvia 50 mg daily Currently blood sugar level is stable on Semglee 15 units daily and sliding scale insulin with Accu-Cheks. Recent Labs  Lab 08/12/22 0913 08/12/22 1158 08/12/22 1634 08/12/22 2004 08/13/22 0820  GLUCAP 124* 154* 119* 190* 100*   Cerebrovascular diseases HLD, PAD s/p bilateral CEA, CAD/CABG, CVA on Eliquis No anginal symptoms at this time Continue Crestor.   Eliquis plan as above  A-fib, CHB s/p PPM 2016 Currently on Toprol 25 mg nightly Eliquis plan as above   Mobility: PT eval appreciated  Goals of care   Code Status: Full Code     DVT prophylaxis:  Place and maintain sequential compression device Start: 08/08/22 1005   Antimicrobials: None currently Fluid: None Consultants: GI, IR Family Communication: Son at bedside  Status: Inpatient Level of care:  Telemetry   Needs to continue in-hospital care:  Pending kyphoplasty today.  Prognosis:  Guarded given muscle deconditioning and impaired mobility due to back pain  Patient from: Home Anticipated d/c to: This afternoon versus tomorrow    Scheduled Meds:  acetaminophen  1,000 mg Oral TID   amLODipine  5 mg Oral Daily   bupivacaine(PF)       ferrous sulfate  325 mg Oral Q  breakfast   insulin aspart  0-9 Units Subcutaneous TID WC   insulin glargine-yfgn  15 Units Subcutaneous Daily   lidocaine  1 patch Transdermal Q24H   lubiprostone  8 mcg Oral Q breakfast   melatonin  3 mg Oral QHS   metoprolol succinate  25 mg Oral QHS   pantoprazole  40 mg Oral BID   rosuvastatin  20 mg Oral Daily   sodium bicarbonate  650 mg Oral BID   sodium chloride flush  3 mL Intravenous Q12H   tobramycin        PRN meds: bupivacaine(PF), ceFAZolin, fentaNYL, midazolam, morphine injection, ondansetron (ZOFRAN) IV, oxyCODONE, polyethylene glycol, tobramycin  Infusions:   ceFAZolin     ferric gluconate (FERRLECIT) IVPB     lactated ringers 10 mL/hr at 08/11/22 2112    Diet:  Diet Order             Diet NPO time specified Except for: Sips with Meds  Diet effective midnight                   Antimicrobials: Anti-infectives (From admission, onward)    Start     Dose/Rate Route Frequency Ordered Stop   08/13/22 1228  ceFAZolin (ANCEF) IVPB 2g/100 mL premix        over 30 Minutes Intravenous Continuous PRN 08/13/22 1228     08/13/22 1205  tobramycin (NEBCIN) 1.2 g powder       Note to Pharmacy: [place]: cabinet override      08/13/22 1205 08/14/22 0014       Skin assessment:       Nutritional status:  Body mass index is 27.64 kg/m.          Objective: Vitals:   08/13/22 1300 08/13/22 1306  BP: (!) 172/65 (!) 179/65  Pulse: 69 72  Resp: 18 20  Temp:    SpO2: 99% 100%    Intake/Output Summary (Last 24 hours) at 08/13/2022 1323 Last data filed at 08/12/2022 1745 Gross per 24 hour  Intake 300 ml  Output --  Net 300 ml   Filed Weights   08/11/22 0500 08/12/22 0435 08/13/22 0500  Weight: 89 kg 88.9 kg 89.9 kg   Weight change: 1 kg Body mass index is 27.64 kg/m.   Physical Exam: General exam: Pleasant, elderly male.  Lying down in bed.  Mild distress because of back pain with ambulation. Skin: No rashes, lesions or  ulcers. HEENT: Atraumatic, normocephalic, no obvious bleeding Lungs: Clear to auscultation bilaterally CVS: Regular rate and rhythm, no murmur GI/Abd: soft, nontender, nondistended, bowel sound present CNS: Alert, awake, oriented x 3 Psychiatry: Mood appropriate Extremities: No pedal edema, no calf tenderness  Data Review: I have personally reviewed the laboratory data and studies available.  F/u labs ordered Unresulted Labs (From admission, onward)     Start     Ordered   08/13/22 0500  CBC with Differential/Platelet  Daily,   R      08/12/22 0815   08/13/22 [id number]  Basic metabolic panel  Daily,   R      08/12/22 0815            Total time spent in review of labs and imaging, patient evaluation, formulation of plan, documentation and communication with family: 65 minutes  Signed, Terrilee Croak, MD [facility] 08/13/2022

## 2022-08-13 NOTE — Procedures (Signed)
INR.  Status post L1  balloon kyphoplasty using biplane fluoroscopy.  No acute complications.  Patient tolerated the procedure well.  Arlean Hopping MD

## 2022-08-13 NOTE — Discharge Instructions (Signed)
INR.  1.  No stooping, bending, or lifting weights above 10 pounds for 2 weeks.  2.  Use a walker to ambulate for 2 weeks.  3.  Refrain from driving for 2 weeks.  4.  Follow up with referring MD in 2 weeks as necessary.  Arlean Hopping MD

## 2022-08-13 NOTE — Discharge Summary (Signed)
Physician Discharge Summary  Dean Murphy [id number] DOB: 02-Mar-1927 DOA: 08/07/2022  PCP: Tonia Ghent, MD  Admit date: 08/07/2022 Discharge date: 08/13/2022  Admitted From: Home Discharge disposition: Home  Recommendations at discharge:  Continue Protonix twice a day.  Avoid ibuprofen and other NSAIDs. Resume Eliquis on 3/23. Continue pain control with scheduled Tylenol, as needed Roxicodone and topical lidocaine patch PRN Because of impaired renal function, losartan and Demadex have been held.  There have been replaced by amlodipine for blood pressure control.  Continue metoprolol Follow-up with primary care provider in 1 week.  Need repeat CBC and BMP. Continue Lantus at a lower dose of 15 units daily.  Keep glipizide and Januvia on hold for now.  Continue to monitor blood glucose level at home and resume meds gradually under the supervision of primary care provider   Brief narrative: Dean Murphy is a 89 y.o. male with PMH significant for DM2, HTN, HLD, PAD s/p bilateral CEA, CAD/CABG, A-fib, CHB s/p PPM 2016, CVA on Eliquis, CHF, chronic thrombocytopenia 3/15, patient presented to the ED with complaint of dark stool, and progressive weakness Patient was seen by his PCP few days prior for 3 to 4 weeks history of black stool and a drop in hemoglobin to 9.3 from a baseline of 12.4.  He was recommended to hold Eliquis and stop ibuprofen despite which dark stool continued and hence sent to the ED.  On initial workup, hemoglobin was low at 8.7, FOBT positive Started on IV Protonix Admitted to [facility] GI consulted  Subjective: Patient was seen and examined this afternoon.  Kyphoplasty done.  Feels better on walking. multiple family members at bedside.    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  Biopsies taken. Recommended to avoid NSAIDs.  Continue PPI twice daily. Plan to resume Eliquis after 5 days of colonoscopy, 3/23.  Acute on chronic blood loss anemia Iron deficiency anemia Hemoglobin at baseline 13.4 from July 2023.  Presented with a low hemoglobin of 7.5 secondary to GI bleeding.  Anemia panel showed low ferritin level.  Patient probably had slow insidious GI bleeding in the span of the last several months. IV iron 1 dose was given.  Oral iron supplement started. Hemoglobin was lowest at 7 on 3/19.  1 unit PRBC transfused.  Improved and stable after that. Recent Labs    12/18/21 0937 01/20/22 1354 08/04/22 1142 08/07/22 1238 08/09/22 1742 08/10/22 0301 08/11/22 0110 08/12/22 0839 08/13/22 0317  HGB 13.4   < > 9.3*   < > 8.6* 7.2* 7.0* 8.1* 7.9*  MCV 92.9   < > 96.2   < > 96.7 97.4 96.8 95.7 95.7  FERRITIN  --   --  20.6*  --   --   --   --   --   --   IRON 89  --  148  --   --   --   --   --   --    < > = values in this interval not displayed.   Intractable back pain  Apparently had a fall in August 2023 leading to L1 and L2 compression fractures with mild height loss as evidenced on CT 12/2021  Currently on scheduled Tylenol, as  needed Roxicodone and topical lidocaine patch PRN On my evaluation, patient states that his quality of life has been significantly affected by the back pain.  IR did kyphoplasty this afternoon 3/21.  Feels better.  Acute on chronic thrombocytopenia Acute drop of platelet likely due to consumption due to active bleeding Low but stable platelet count.  No need of platelet transfusion at this time. Recent Labs  Lab 08/08/22 0218 08/08/22 1018 08/08/22 1751 08/09/22 0323 08/09/22 0930 08/09/22 1742 08/10/22 0301 08/11/22 0110 08/12/22 0839 08/13/22 0317  PLT 66* 66* 70* 70* 63* 78* 61* 65* 65*  67*   Hyperkalemia K was 6.4 on presentation.   Losartan was kept on hold.  Potassium level improved with Lokelma. Continue to monitor. Recent Labs  Lab 08/08/22 0218 08/08/22 0633 08/09/22 1742 08/10/22 0301 08/11/22 0110 08/12/22 0839 08/13/22 0317  K 5.8*   < > 4.8 5.2* 5.1 4.3 4.6  MG 2.4  --   --   --   --   --   --    < > = values in this interval not displayed.   Chronic diastolic CHF Essential hypertension Currently euvolemic. PTA on Toprol 25 mg daily,, losartan 100 mg daily, Demadex 10 mg twice a week Continue Toprol 25 mg nightly.  Losartan Demadex on hold.  Remains euvolemic. Blood pressure running elevated to 170s.  Will add amlodipine 5 mg daily.   CKD 4 Chronic metabolic acidosis Baseline creatinine close to 2.5.  Creatinine worsening for last 2 days.  Losartan and Demadex remain on hold. Serum bicarb level improving on sodium bicarb supplement.  Recent Labs    08/08/22 0633 08/08/22 1018 08/08/22 1725 08/09/22 0323 08/09/22 0930 08/09/22 1742 08/10/22 0301 08/11/22 0110 08/12/22 0839 08/13/22 0317  BUN 40* 39* 36* 35* 33* 32* 30* 27* 31* 32*  CREATININE 2.61* 2.46* 2.39* 2.42* 2.37* 2.48* 2.39* 2.52* 2.67* 2.77*  CO2 18* 17* 19* 20* 23 21* 23 24 22 23    Type 2 diabetes mellitus A1c 6.3 on 08/04/2022 PTA on glipizide twice daily, Lantus 34 units daily, Januvia 50 mg daily Currently blood sugar level is stable on Semglee 15 units daily. I have advised the patient to continue Lantus at a lower dose of 15 units daily.  Keep glipizide and Januvia on hold for now.  Continue to monitor blood glucose level at home and resume meds gradually under the supervision of primary care provider Recent Labs  Lab 08/12/22 1158 08/12/22 1634 08/12/22 2004 08/13/22 0820 08/13/22 1611  GLUCAP 154* 119* 190* 100* 116*   Cerebrovascular diseases HLD, PAD s/p bilateral CEA, CAD/CABG, CVA on Eliquis No anginal symptoms at this time Continue Crestor.   Eliquis  plan as above  A-fib, CHB s/p PPM 2016 Currently on Toprol 25 mg nightly Eliquis plan as above   Mobility: PT eval appreciated  Goals of care   Code Status: Full Code   Wounds:  - Wound / Incision (Open or Dehisced) 08/09/22 Skin tear Arm Lower;Posterior;Right (Active)  Date First Assessed/Time First Assessed: 08/09/22 1532   Wound Type: Skin tear  Location: Arm  Location Orientation: Lower;Posterior;Right  Present on Admission: No    Assessments 08/09/2022  3:33 PM 08/13/2022  8:00 AM  Dressing Type -- Foam - Lift dressing to assess site every shift  Wound Length (cm) 3 cm --  Wound Width (cm) 0 cm --  Wound Depth (cm) 0 cm --  Wound Volume (cm^3) 0 cm^3 --  Wound Surface Area (cm^2) 0 cm^2 --  No associated orders.    Discharge Exam:   Vitals:   08/13/22 1255 08/13/22 1300 08/13/22 1306 08/13/22 1607  BP: (!) 170/64 (!) 172/65 (!) 179/65 (!) 192/65  Pulse: 67 69 72 66  Resp: 16 18 20 17   Temp:    (!) 97.3 F (36.3 C)  TempSrc:    Oral  SpO2: 98% 99% 100% 97%  Weight:      Height:        Body mass index is 27.64 kg/m.   General exam: Pleasant, elderly male.  Lying down in bed.  Mild distress because of back pain with ambulation. Skin: No rashes, lesions or ulcers. HEENT: Atraumatic, normocephalic, no obvious bleeding Lungs: Clear to auscultation bilaterally CVS: Regular rate and rhythm, no murmur GI/Abd: soft, nontender, nondistended, bowel sound present CNS: Alert, awake, oriented x 3 Psychiatry: Mood appropriate Extremities: No pedal edema, no calf tenderness  Follow ups:    Follow-up Information     Tonia Ghent, MD Follow up.   Specialty: Family Medicine Contact information: [place] [address] [place], [place] [facility] Follow up.   Specialty: Neurosurgery Contact information: [place] [address] [phone]         Tonia Ghent,  MD Follow up.   Specialty: Family Medicine Contact information: [place] [address] [phone]                 Discharge Instructions:   Discharge Instructions     Call MD for:  difficulty breathing, headache or visual disturbances   Complete by: As directed    Call MD for:  extreme fatigue   Complete by: As directed    Call MD for:  hives   Complete by: As directed    Call MD for:  persistant dizziness or light-headedness   Complete by: As directed    Call MD for:  persistant nausea and vomiting   Complete by: As directed    Call MD for:  severe uncontrolled pain   Complete by: As directed    Call MD for:  temperature >100.4   Complete by: As directed    Diet general   Complete by: As directed    Discharge instructions   Complete by: As directed    Recommendations at discharge:   Continue Protonix twice a day.  Avoid ibuprofen and other NSAIDs.  Resume Eliquis on 3/23.  Continue pain control with scheduled Tylenol, as needed Roxicodone and topical lidocaine patch PRN  Because of impaired renal function, losartan and Demadex have been held.  There have been replaced by amlodipine for blood pressure control.  Continue metoprolol  Follow-up with primary care provider in 1 week.  Need repeat CBC and BMP.  Continue Lantus at a lower dose of 15 units daily.  Keep glipizide and Januvia on hold for now.  Continue to monitor blood glucose level at home and resume meds gradually under the supervision of primary care provider   Discharge instructions for diabetes mellitus: Check blood sugar 3 times a day and bedtime at home. If blood sugar running above 200 or less than 70 please call your MD to adjust insulin. If you notice signs and symptoms of hypoglycemia (low blood sugar) like jitteriness, confusion, thirst, tremor and sweating, please check blood sugar, drink sugary drink/biscuits/sweets to increase sugar level and call MD or return to  ER.    General discharge instructions: Follow with Primary MD Tonia Ghent, MD in 7 days  Please request your PCP  to go over your hospital tests, procedures, radiology results at the follow up. Please get your medicines reviewed and adjusted.  Your PCP may decide to repeat certain labs or tests as needed. Do not drive, operate heavy machinery, perform activities at heights, swimming or participation in water activities or provide baby sitting services if your were admitted for syncope or siezures until you have seen by Primary MD or a Neurologist and advised to do so again. [place] Controlled Substance Reporting System database was reviewed. Do not drive, operate heavy machinery, perform activities at heights, swim, participate in water activities or provide baby-sitting services while on medications for pain, sleep and mood until your outpatient physician has reevaluated you and advised to do so again.  You are strongly recommended to comply with the dose, frequency and duration of prescribed medications. Activity: As tolerated with Full fall precautions use walker/cane & assistance as needed Avoid using any recreational substances like cigarette, tobacco, alcohol, or non-prescribed drug. If you experience worsening of your admission symptoms, develop shortness of breath, life threatening emergency, suicidal or homicidal thoughts you must seek medical attention immediately by calling 911 or calling your MD immediately  if symptoms less severe. You must read complete instructions/literature along with all the possible adverse reactions/side effects for all the medicines you take and that have been prescribed to you. Take any new medicine only after you have completely understood and accepted all the possible adverse reactions/side effects.  Wear Seat belts while driving. You were cared for by a hospitalist during your hospital stay. If you have any questions about your discharge medications  or the care you received while you were in the hospital after you are discharged, you can call the unit and ask to speak with the hospitalist or the covering physician. Once you are discharged, your primary care physician will handle any further medical issues. Please note that NO REFILLS for any discharge medications will be authorized once you are discharged, as it is imperative that you return to your primary care physician (or establish a relationship with a primary care physician if you do not have one).   Discharge wound care:   Complete by: As directed    Increase activity slowly   Complete by: As directed        Discharge Medications:   Allergies as of 08/13/2022       Reactions   Zoledronic Acid Other (See Comments)   Stopped 2015 due to Creatine  Other reaction(s): Unknown Stopped 2015 due to Cr   Lokelma [sodium Zirconium Cyclosilicate]    Nausea/vomiting   Nsaids Other (See Comments)   Held due to creatinine elevation.          Medication List     STOP taking these medications    glipiZIDE 5 MG tablet Commonly known as: GLUCOTROL   Januvia 50 MG tablet Generic drug: sitaGLIPtin   losartan 100 MG tablet Commonly known as: COZAAR   omeprazole 40 MG capsule Commonly known as: PRILOSEC       TAKE these medications    acetaminophen 500 MG tablet Commonly known as: TYLENOL Take 2 tablets (1,000 mg total) by mouth 3 (three) times daily.   amLODipine 5 MG tablet Commonly known as: NORVASC Take 1 tablet (5 mg total) by mouth daily.   amoxicillin 500 MG capsule Commonly known as: AMOXIL  Take 1 capsule (500 mg total) by mouth daily.   apixaban 2.5 MG Tabs tablet Commonly known as: Eliquis Take 1 tablet (2.5 mg total) by mouth 2 (two) times daily. Held as of 08/05/22 Start taking on: August 15, 2022 What changed:  how much to take how to take this when to take this These instructions start on August 15, 2022. If you are unsure what to do until then,  ask your doctor or other care provider.   B-D UF III MINI PEN NEEDLES 31G X 5 MM Misc Generic drug: Insulin Pen Needle USE DAILY WITH INSULIN PEN   cholecalciferol 25 MCG (1000 UT) tablet Take 1,000 Units by mouth daily.   clotrimazole-betamethasone cream Commonly known as: LOTRISONE APPLY TO AFFECTED AREA TWICE A DAY What changed: See the new instructions.   dorzolamide-timolol 2-0.5 % ophthalmic solution Commonly known as: COSOPT Place 1 drop into both eyes daily in the afternoon. In both eye   ferrous sulfate 325 (65 FE) MG tablet Take 1 tablet (325 mg total) by mouth daily with breakfast. Start taking on: August 14, 2022 What changed:  how much to take how to take this when to take this additional instructions   Lantus SoloStar 100 UNIT/ML Solostar Pen Generic drug: insulin glargine Inject 15 Units into the skin daily. What changed: how much to take   lidocaine 4 % Place 1 patch onto the skin daily as needed for up to 15 days (pain).   lubiprostone 8 MCG capsule Commonly known as: AMITIZA TAKE ONE CAPSULE BY MOUTH EACH MORNING What changed: See the new instructions.   metoprolol succinate 25 MG 24 hr tablet Commonly known as: Toprol XL Take 1 tablet (25 mg total) by mouth at bedtime.   oxyCODONE 5 MG immediate release tablet Commonly known as: Oxy IR/ROXICODONE Take 1 tablet (5 mg total) by mouth every 6 (six) hours as needed for up to 7 days for moderate pain.   pantoprazole 40 MG tablet Commonly known as: PROTONIX Take 1 tablet (40 mg total) by mouth 2 (two) times daily.   polyethylene glycol powder 17 GM/SCOOP powder Commonly known as: GLYCOLAX/MIRALAX Take 17 g by mouth daily as needed. What changed: reasons to take this   rosuvastatin 20 MG tablet Commonly known as: CRESTOR Take 1 tablet (20 mg total) by mouth daily.   sodium bicarbonate 650 MG tablet Take 1 tablet (650 mg total) by mouth 2 (two) times daily.   torsemide 10 MG tablet Commonly  known as: DEMADEX Take 1 tablet (10 mg total) by mouth 2 (two) times a week. Monday and Friday               Discharge Care Instructions  (From admission, onward)           Start     Ordered   08/13/22 0000  Discharge wound care:        08/13/22 1623             The results of significant diagnostics from this hospitalization (including imaging, microbiology, ancillary and laboratory) are listed below for reference.    Procedures and Diagnostic Studies:   No results found.   Labs:   Basic Metabolic Panel: Recent Labs  Lab 08/08/22 0218 08/08/22 [id number] 08/09/22 1742 08/10/22 0301 08/11/22 0110 08/12/22 0839 08/13/22 0317  NA 140   < > 141 141 141 141 142  K 5.8*   < > 4.8 5.2* 5.1 4.3 4.6  CL 118*   < >  107 112* 112* 112* 113*  CO2 18*   < > 21* 23 24 22 23   GLUCOSE 99   < > 147* 124* 139* 113* 119*  BUN 42*   < > 32* 30* 27* 31* 32*  CREATININE 2.53*   < > 2.48* 2.39* 2.52* 2.67* 2.77*  CALCIUM 8.8*   < > 9.4 8.6* 8.4* 8.1* 8.1*  MG 2.4  --   --   --   --   --   --    < > = values in this interval not displayed.   GFR Estimated Creatinine Clearance: 17.7 mL/min (A) (by C-G formula based on SCr of 2.77 mg/dL (H)). Liver Function Tests: Recent Labs  Lab 08/07/22 1238 08/08/22 0218  AST 27 23  ALT 27 25  ALKPHOS 96 78  BILITOT 0.8 0.6  PROT 5.9* 5.0*  ALBUMIN 3.4* 2.8*   No results for input(s): "LIPASE", "AMYLASE" in the last 168 hours. No results for input(s): "AMMONIA" in the last 168 hours. Coagulation profile Recent Labs  Lab 08/13/22 0317  INR 1.3*    CBC: Recent Labs  Lab 08/07/22 1238 08/07/22 2025 08/09/22 1742 08/10/22 0301 08/11/22 0110 08/12/22 0839 08/13/22 0317  WBC 7.2   < > 5.0 3.0* 3.5* 3.0* 2.8*  NEUTROABS 5.1  --   --   --  2.3  --  1.6*  HGB 8.7*   < > 8.6* 7.2* 7.0* 8.1* 7.9*  HCT 27.6*   < > 26.1* 22.4* 21.2* 24.6* 24.2*  MCV 102.2*   < > 96.7 97.4 96.8 95.7 95.7  PLT 88*   < > 78* 61* 65* 65* 67*   < >  = values in this interval not displayed.   Cardiac Enzymes: No results for input(s): "CKTOTAL", "CKMB", "CKMBINDEX", "TROPONINI" in the last 168 hours. BNP: Invalid input(s): "POCBNP" CBG: Recent Labs  Lab 08/12/22 1158 08/12/22 1634 08/12/22 2004 08/13/22 0820 08/13/22 1611  GLUCAP 154* 119* 190* 100* 116*   D-Dimer No results for input(s): "DDIMER" in the last 72 hours. Hgb A1c No results for input(s): "HGBA1C" in the last 72 hours. Lipid Profile No results for input(s): "CHOL", "HDL", "LDLCALC", "TRIG", "CHOLHDL", "LDLDIRECT" in the last 72 hours. Thyroid function studies No results for input(s): "TSH", "T4TOTAL", "T3FREE", "THYROIDAB" in the last 72 hours.  Invalid input(s): "FREET3" Anemia work up No results for input(s): "VITAMINB12", "FOLATE", "FERRITIN", "TIBC", "IRON", "RETICCTPCT" in the last 72 hours. Microbiology No results found for this or any previous visit (from the past 240 hour(s)).  Time coordinating discharge: 55 minutes  Signed: Tiffine Henigan  [facility] 08/13/2022, 4:24 PM

## 2022-08-14 ENCOUNTER — Encounter: Payer: Self-pay | Admitting: *Deleted

## 2022-08-14 NOTE — Telephone Encounter (Signed)
This encounter was created in error - please disregard.

## 2022-08-18 ENCOUNTER — Encounter: Payer: Self-pay | Admitting: Gastroenterology

## 2022-08-20 ENCOUNTER — Encounter: Payer: Self-pay | Admitting: Family Medicine

## 2022-08-20 ENCOUNTER — Ambulatory Visit: Payer: Medicare Other | Admitting: Family Medicine

## 2022-08-20 ENCOUNTER — Encounter: Payer: Medicare Other | Admitting: Internal Medicine

## 2022-08-20 VITALS — BP 124/62 | HR 70 | Temp 97.7°F | Ht 71.0 in | Wt 193.0 lb

## 2022-08-20 DIAGNOSIS — K922 Gastrointestinal hemorrhage, unspecified: Secondary | ICD-10-CM

## 2022-08-20 DIAGNOSIS — K921 Melena: Secondary | ICD-10-CM

## 2022-08-20 DIAGNOSIS — R159 Full incontinence of feces: Secondary | ICD-10-CM | POA: Diagnosis not present

## 2022-08-20 DIAGNOSIS — E1122 Type 2 diabetes mellitus with diabetic chronic kidney disease: Secondary | ICD-10-CM | POA: Diagnosis not present

## 2022-08-20 DIAGNOSIS — L989 Disorder of the skin and subcutaneous tissue, unspecified: Secondary | ICD-10-CM

## 2022-08-20 LAB — CBC WITH DIFFERENTIAL/PLATELET
Basophils Absolute: 0 10*3/uL (ref 0.0–0.1)
Basophils Relative: 0.7 % (ref 0.0–3.0)
Eosinophils Absolute: 0.2 10*3/uL (ref 0.0–0.7)
Eosinophils Relative: 3 % (ref 0.0–5.0)
HCT: 30.6 % — ABNORMAL LOW (ref 39.0–52.0)
Hemoglobin: 10 g/dL — ABNORMAL LOW (ref 13.0–17.0)
Lymphocytes Relative: 21.3 % (ref 12.0–46.0)
Lymphs Abs: 1.1 10*3/uL (ref 0.7–4.0)
MCHC: 32.8 g/dL (ref 30.0–36.0)
MCV: 94.5 fl (ref 78.0–100.0)
Monocytes Absolute: 0.4 10*3/uL (ref 0.1–1.0)
Monocytes Relative: 7 % (ref 3.0–12.0)
Neutro Abs: 3.5 10*3/uL (ref 1.4–7.7)
Neutrophils Relative %: 68 % (ref 43.0–77.0)
Platelets: 94 10*3/uL — ABNORMAL LOW (ref 150.0–400.0)
RBC: 3.23 Mil/uL — ABNORMAL LOW (ref 4.22–5.81)
RDW: 15.6 % — ABNORMAL HIGH (ref 11.5–15.5)
WBC: 5.2 10*3/uL (ref 4.0–10.5)

## 2022-08-20 LAB — COMPREHENSIVE METABOLIC PANEL
ALT: 17 U/L (ref 0–53)
AST: 21 U/L (ref 0–37)
Albumin: 3.6 g/dL (ref 3.5–5.2)
Alkaline Phosphatase: 108 U/L (ref 39–117)
BUN: 36 mg/dL — ABNORMAL HIGH (ref 6–23)
CO2: 25 mEq/L (ref 19–32)
Calcium: 9 mg/dL (ref 8.4–10.5)
Chloride: 110 mEq/L (ref 96–112)
Creatinine, Ser: 2.53 mg/dL — ABNORMAL HIGH (ref 0.40–1.50)
GFR: 21.32 mL/min — ABNORMAL LOW (ref >60.00)
Glucose, Bld: 352 mg/dL — ABNORMAL HIGH (ref 70–99)
Potassium: 4.9 mEq/L (ref 3.5–5.1)
Sodium: 141 mEq/L (ref 135–145)
Total Bilirubin: 0.4 mg/dL (ref 0.2–1.2)
Total Protein: 6 g/dL (ref 6.0–8.3)

## 2022-08-20 MED ORDER — LANTUS SOLOSTAR 100 UNIT/ML ~~LOC~~ SOPN: 17.0000 [IU] | PEN_INJECTOR | Freq: Every day | SUBCUTANEOUS | 3 refills | Status: DC

## 2022-08-20 MED ORDER — FERROUS SULFATE 325 (65 FE) MG PO TABS: 325.0000 mg | ORAL_TABLET | ORAL | 0 refills | Status: DC

## 2022-08-20 NOTE — Patient Instructions (Addendum)
Go to the lab on the way out.   If you have mychart we'll likely use that to update you.    Take care.  Glad to see you. Update me if your sugar is increasing.   Stop lubiprostone (AMITIZA) 8 MCG capsule.  See if that helps with bowel control.   Call about seeing Dr. Ronnald Ramp.  If you have any trouble getting an appointment then let me know.

## 2022-08-20 NOTE — Progress Notes (Signed)
Inpatient f/u .   Recommendations at discharge:  Continue Protonix twice a day.  Avoid ibuprofen and other NSAIDs. Resume Eliquis on 3/23. Continue pain control with scheduled Tylenol, as needed Roxicodone and topical lidocaine patch PRN Because of impaired renal function, losartan and Demadex have been held.  There have been replaced by amlodipine for blood pressure control.  Continue metoprolol Follow-up with primary care provider in 1 week.  Need repeat CBC and BMP. Continue Lantus at a lower dose of 15 units daily.  Keep glipizide and Januvia on hold for now.  Continue to monitor blood glucose level at home and resume meds gradually under the supervision of primary care provider   Hospital course: Acute on chronic GI bleeding Presented with dark stools, weakness and drop in hemoglobin GI was consulted 3/17, EGD was normal except for small sliding hiatal hernia 3/18, colonoscopy showed 2 colonic angiectasia treated with APC, ascending colon and sigmoid colon polyp, sigmoid colon diverticulosis and nonbleeding internal hemorrhoids.  Biopsies taken. Recommended to avoid NSAIDs.  Continue PPI twice daily. Plan to resume Eliquis after 5 days of colonoscopy, 3/23.   Acute on chronic blood loss anemia Iron deficiency anemia Hemoglobin at baseline 13.4 from July 2023.  Presented with a low hemoglobin of 7.5 secondary to GI bleeding.  Anemia panel showed low ferritin level.  Patient probably had slow insidious GI bleeding in the span of the last several months. IV iron 1 dose was given.  Oral iron supplement started. Hemoglobin was lowest at 7 on 3/19.  1 unit PRBC transfused.  Improved and stable after that. Recent Labs (within last 365 days)   Intractable back pain  Apparently had a fall in August 2023 leading to L1 and L2 compression fractures with mild height loss as evidenced on CT 12/2021  Currently on scheduled Tylenol, as needed Roxicodone and topical lidocaine patch PRN On my  evaluation, patient states that his quality of life has been significantly affected by the back pain.  IR did kyphoplasty this afternoon 3/21.  Feels better.   Acute on chronic thrombocytopenia Acute drop of platelet likely due to consumption due to active bleeding Low but stable platelet count.  No need of platelet transfusion at this time. Last Labs   Hyperkalemia K was 6.4 on presentation.   Losartan was kept on hold.  Potassium level improved with Lokelma. Continue to monitor. Last Labs   Chronic diastolic CHF Essential hypertension Currently euvolemic. PTA on Toprol 25 mg daily,, losartan 100 mg daily, Demadex 10 mg twice a week Continue Toprol 25 mg nightly.  Losartan Demadex on hold.  Remains euvolemic. Blood pressure running elevated to 170s.  Will add amlodipine 5 mg daily.      CKD 4 Chronic metabolic acidosis Baseline creatinine close to 2.5.  Creatinine worsening for last 2 days.  Losartan and Demadex remain on hold. Serum bicarb level improving on sodium bicarb supplement.      Recent Labs (within last 365 days)   Type 2 diabetes mellitus A1c 6.3 on 08/04/2022  ========================================== PTA on glipizide twice daily, Lantus 34 units daily, Januvia 50 mg daily Currently blood sugar level is stable on Semglee 15 units daily. I have advised the patient to continue Lantus at a lower dose of 15 units daily.  Keep glipizide and Januvia on hold for now.  Continue to monitor blood glucose level at home and resume meds gradually under the supervision of primary care provider Last Labs   Cerebrovascular diseases HLD, PAD s/p bilateral  CEA, CAD/CABG, CVA on Eliquis No anginal symptoms at this time Continue Crestor.   Eliquis plan as above   A-fib, CHB s/p PPM 2016 Currently on Toprol 25 mg nightly Eliquis plan as above    ========================================== Inpatient follow-up.  Admitted with GI bleed.  Inpatient course discussed with  patient.  GI procedures reviewed with patient, colonoscopy and endoscopy. No black stools.  He clearly feels better in the meantime.  Bowel control d/w pt.  Discussed stopping amitiza.  No abdominal pain.  See notes on follow-up labs.  Diabetes.  He is off [place] and glipizide for now.   Sugar yesterday AM 126.    Skin lesion noted on the back.  He wanted that evaluated.  See exam.  Back pain is clearly improved in the meantime.  Meds, vitals, and allergies reviewed.   ROS: Per HPI unless specifically indicated in ROS section   GEN: nad, alert and oriented HEENT: ncat NECK: supple w/o LA CV: rrr.  PULM: ctab, no inc wob ABD: soft, +bs EXT: no edema SKIN: no acute rash 12x44mm raised lesion on the R midback.

## 2022-08-21 ENCOUNTER — Ambulatory Visit: Payer: Medicare Other | Admitting: Internal Medicine

## 2022-08-23 ENCOUNTER — Telehealth: Payer: Self-pay | Admitting: Family Medicine

## 2022-08-23 ENCOUNTER — Other Ambulatory Visit: Payer: Self-pay | Admitting: Family Medicine

## 2022-08-23 DIAGNOSIS — L989 Disorder of the skin and subcutaneous tissue, unspecified: Secondary | ICD-10-CM | POA: Insufficient documentation

## 2022-08-23 DIAGNOSIS — R159 Full incontinence of feces: Secondary | ICD-10-CM | POA: Insufficient documentation

## 2022-08-23 DIAGNOSIS — D649 Anemia, unspecified: Secondary | ICD-10-CM

## 2022-08-23 MED ORDER — PANTOPRAZOLE SODIUM 40 MG PO TBEC: 40.0000 mg | DELAYED_RELEASE_TABLET | Freq: Two times a day (BID) | ORAL | 3 refills | Status: DC

## 2022-08-23 MED ORDER — AMLODIPINE BESYLATE 5 MG PO TABS: 5.0000 mg | ORAL_TABLET | Freq: Every day | ORAL | 0 refills | Status: DC

## 2022-08-23 MED ORDER — AMLODIPINE BESYLATE 5 MG PO TABS: 5.0000 mg | ORAL_TABLET | Freq: Every day | ORAL | 3 refills | Status: DC

## 2022-08-23 MED ORDER — PANTOPRAZOLE SODIUM 40 MG PO TBEC: 40.0000 mg | DELAYED_RELEASE_TABLET | Freq: Two times a day (BID) | ORAL | 0 refills | Status: DC

## 2022-08-23 NOTE — Assessment & Plan Note (Signed)
It looks like he is going to need potential excision per dermatology, referral placed.

## 2022-08-23 NOTE — Telephone Encounter (Signed)
Office visit note is now closed.  Please see if his dermatology appointment can be moved sooner.  Please see MyChart message.  Thanks.

## 2022-08-23 NOTE — Assessment & Plan Note (Signed)
He is off Januvia and glipizide in the meantime.  His sugar yesterday was 126.  I would continue as is for now and we can recheck periodically.  If he has sugar readings that are gradually increasing he can update me in the meantime.

## 2022-08-23 NOTE — Assessment & Plan Note (Signed)
Clearly improved in the meantime.  Would continue anticoagulation for now unless he had another bleed.  See notes on follow-up labs.  Okay for outpatient follow-up.

## 2022-08-23 NOTE — Assessment & Plan Note (Signed)
Stop lubiprostone (AMITIZA) 8 MCG capsule for now and he'll see if that helps with bowel control.  He can update me as needed.

## 2022-08-28 NOTE — Telephone Encounter (Signed)
Please check with patient.  I thought he was seen in the meantime.  Thanks.

## 2022-08-28 NOTE — Telephone Encounter (Signed)
First available appt is May 9th -- Pt and Provider want this done sooner. Requesting that I try to get this moved sooner.   Will call on Monday 4/8  See referral for updates.

## 2022-09-01 NOTE — Telephone Encounter (Signed)
Pt seen 08/25/2022 by Dr Arminda Resides with [facility].  Notes in Epic (media)

## 2022-09-01 NOTE — Telephone Encounter (Signed)
Thanks

## 2022-09-02 NOTE — Progress Notes (Signed)
Remote pacemaker transmission.   

## 2022-09-21 ENCOUNTER — Other Ambulatory Visit: Payer: Self-pay | Admitting: Family Medicine

## 2022-10-20 ENCOUNTER — Other Ambulatory Visit: Payer: Self-pay | Admitting: *Deleted

## 2022-10-20 DIAGNOSIS — I10 Essential (primary) hypertension: Secondary | ICD-10-CM

## 2022-10-20 DIAGNOSIS — E785 Hyperlipidemia, unspecified: Secondary | ICD-10-CM

## 2022-10-20 MED ORDER — ROSUVASTATIN CALCIUM 20 MG PO TABS: 20.0000 mg | ORAL_TABLET | Freq: Every day | ORAL | 2 refills | Status: DC

## 2022-10-27 ENCOUNTER — Ambulatory Visit (INDEPENDENT_AMBULATORY_CARE_PROVIDER_SITE_OTHER): Payer: Medicare Other

## 2022-10-27 DIAGNOSIS — I442 Atrioventricular block, complete: Secondary | ICD-10-CM | POA: Diagnosis not present

## 2022-10-27 LAB — CUP PACEART REMOTE DEVICE CHECK
Battery Remaining Longevity: 13 mo
Battery Remaining Percentage: 17 %
Battery Voltage: 2.89 V
Brady Statistic AP VP Percent: 15 %
Brady Statistic AP VS Percent: 1 %
Brady Statistic AS VP Percent: 85 %
Brady Statistic AS VS Percent: 1 %
Brady Statistic RA Percent Paced: 14 %
Brady Statistic RV Percent Paced: 99 %
Date Time Interrogation Session: 20240604020019
Implantable Lead Connection Status: 753985
Implantable Lead Connection Status: 753985
Implantable Lead Implant Date: 20160321
Implantable Lead Implant Date: 20160321
Implantable Lead Location: 753859
Implantable Lead Location: 753860
Implantable Pulse Generator Implant Date: 20160321
Lead Channel Impedance Value: 300 Ohm
Lead Channel Impedance Value: 340 Ohm
Lead Channel Pacing Threshold Amplitude: 0.75 V
Lead Channel Pacing Threshold Amplitude: 1.25 V
Lead Channel Pacing Threshold Pulse Width: 0.5 ms
Lead Channel Pacing Threshold Pulse Width: 0.8 ms
Lead Channel Sensing Intrinsic Amplitude: 3.4 mV
Lead Channel Sensing Intrinsic Amplitude: 9.4 mV
Lead Channel Setting Pacing Amplitude: 2.5 V
Lead Channel Setting Pacing Amplitude: 3 V
Lead Channel Setting Pacing Pulse Width: 0.5 ms
Lead Channel Setting Sensing Sensitivity: 8 mV
Pulse Gen Model: 2240
Pulse Gen Serial Number: 7734710

## 2022-11-05 ENCOUNTER — Other Ambulatory Visit: Payer: Self-pay | Admitting: Family Medicine

## 2022-11-19 NOTE — Progress Notes (Signed)
Remote pacemaker transmission.   

## 2022-11-30 NOTE — Progress Notes (Unsigned)
Office Visit    Patient Name: QUEVIN HUESMAN Date of Encounter: 11/30/2022  Primary Care Provider:  Joaquim Nam, MD Primary Cardiologist:  Tobias Alexander, MD Primary Electrophysiologist: Lewayne Bunting, MD   Past Medical History    Past Medical History:  Diagnosis Date   Anemia    Arthritis    CAD (coronary artery disease)    a. CABG 1989, b. Myoview low risk 2012.   Carotid artery occlusion    a. Duplex 10/2014: patent R/L CEA with mild hyperplasia in right surgical bulb - followed by vascular.   CHB (complete heart block) [facility]) March 2016   a. s/p STJ dual chamber pacemaker 07/2014.   Chronic diastolic CHF (congestive heart failure) (HCC)    a. Dx 07/2014 - acute diastolic CHF in the setting of CHB.   Chronic kidney disease, stage IV (severe) (HCC)    stage III/IV as of 2015, Dr Ella Jubilee Nephologist   Colon polyps    Complication of anesthesia    Constipation    CVA (cerebral infarction)    Diabetes mellitus    type II   Diverticulosis    Dupuytren's disease    finger right hand contracted   Essential hypertension    GERD (gastroesophageal reflux disease)    Hemorrhoids    Hyperlipidemia    Myocardial infarction (HCC) 1976  and Mar. 19, 2016   Orthostasis    Osteoporosis    PAF (paroxysmal atrial fibrillation) (HCC) March 2016   PONV (postoperative nausea and vomiting)    Presence of permanent cardiac pacemaker    Shortness of breath dyspnea    due to pain   Past Surgical History:  Procedure Laterality Date   CARDIAC CATHETERIZATION  08/11/2014   Procedure: TEMPORARY PACEMAKER;  Surgeon: Runell Gess, MD;  Location: [facility] CATH LAB;  Service: Cardiovascular;;   CAROTID ENDARTERECTOMY  12/31/10   Right   CAROTID ENDARTERECTOMY  02/09/11   left   CHOLECYSTECTOMY     COLONOSCOPY WITH PROPOFOL N/A 08/10/2022   Procedure: COLONOSCOPY WITH PROPOFOL;  Surgeon: Napoleon Form, MD;  Location: [facility] ENDOSCOPY;  Service: Gastroenterology;  Laterality: N/A;   CORONARY  ARTERY BYPASS GRAFT  1989   ENDOSCOPIC RETROGRADE CHOLANGIOPANCREATOGRAPHY (ERCP) WITH PROPOFOL N/A 01/27/2022   Procedure: ENDOSCOPIC RETROGRADE CHOLANGIOPANCREATOGRAPHY (ERCP) WITH PROPOFOL;  Surgeon: Meryl Dare, MD;  Location: [facility] ENDOSCOPY;  Service: Gastroenterology;  Laterality: N/A;   ESOPHAGOGASTRODUODENOSCOPY N/A 08/09/2022   Procedure: ESOPHAGOGASTRODUODENOSCOPY (EGD);  Surgeon: Sherrilyn Rist, MD;  Location: [facility] ENDOSCOPY;  Service: Gastroenterology;  Laterality: N/A;   EYE SURGERY Bilateral    Cataract with implants   HOT HEMOSTASIS N/A 08/10/2022   Procedure: HOT HEMOSTASIS (ARGON PLASMA COAGULATION/BICAP);  Surgeon: Napoleon Form, MD;  Location: [facility] ENDOSCOPY;  Service: Gastroenterology;  Laterality: N/A;   I & D KNEE WITH POLY EXCHANGE Left 07/29/2015   Procedure: IRRIGATION AND DEBRIDEMENT LEFT KNEE WITH POLY EXCHANGE;  Surgeon: Samson Frederic, MD;  Location: [facility] OR;  Service: Orthopedics;  Laterality: Left;   IR GENERIC HISTORICAL  03/04/2016   IR US GUIDE VASC ACCESS RIGHT 03/04/2016 Jolaine Click, MD [facility]-INTERV RAD   IR GENERIC HISTORICAL  03/04/2016   IR FLUORO GUIDE CV LINE RIGHT 03/04/2016 Jolaine Click, MD [facility]   IR KYPHO LUMBAR INC FX REDUCE BONE BX UNI/BIL CANNULATION INC/IMAGING  08/13/2022   JOINT REPLACEMENT     bilat. knees   PERMANENT PACEMAKER INSERTION N/A 08/13/2014   STJ dual chamber pacemaker implanted by  Dr Ladona Ridgel for CHB   REMOVAL OF STONES  01/27/2022   Procedure: REMOVAL OF STONES;  Surgeon: Meryl Dare, MD;  Location: [facility] ENDOSCOPY;  Service: Gastroenterology;;   Dennison Mascot  01/27/2022   Procedure: Dennison Mascot;  Surgeon: Meryl Dare, MD;  Location: [facility] ENDOSCOPY;  Service: Gastroenterology;;   TONSILLECTOMY     TOTAL KNEE ARTHROPLASTY     bilateral    Allergies  Allergies  Allergen Reactions   Zoledronic Acid Other (See Comments)    Stopped 2015 due to Creatine  Other reaction(s): Unknown Stopped 2015 due to Cr   Lokelma  [Sodium Zirconium Cyclosilicate]     Nausea/vomiting   Nsaids Other (See Comments)    Held due to creatinine elevation.       History of Present Illness    Dean Murphy  is a 89 year old male with a PMH of CAD s/p CABG times 10/11/1987, DM type II, HTN, HLD, right artery disease s/p CEA, CHB s/p PPM placed 07/2014, HFpEF, stage IV CKD, PAF, prior CVA (on Eliquis) who presents today for 25-month follow-up.  [name] was initially followed by Dr. Delton See and is currently followed by Dr. Shari Prows for management of CAD.  He was seen in 12/2010 with complaint of chest pain and underwent Myoview that was negative for ischemia.  He was seen 07/2014 with complaint of CP along with SOB.  EKG was completed showing A-V dissociation and troponins were minimally elevated.  Patient was also found to be in acute CHF and temp wire was placed and patient underwent PPM placement with [facility] device on 07/2014.  Patient was noted to also develop AF prior to his device in place and was started on Eliquis.  He was seen last in follow-up on 06/04/2022 by Dr. Shari Prows.  During visit patient reported doing well and tolerating his medications.  He was noted to be able to do yard work such as mowing grass and raking leaves without angina.  Blood pressures were noted to be elevated and patient advised to keep BP log.   Since last being seen in the office patient reports***.  Patient denies chest pain, palpitations, dyspnea, PND, orthopnea, nausea, vomiting, dizziness, syncope, edema, weight gain, or early satiety.     ***Notes:  Home Medications    Current Outpatient Medications  Medication Sig Dispense Refill   acetaminophen (TYLENOL) 500 MG tablet Take 2 tablets (1,000 mg total) by mouth 3 (three) times daily. 30 tablet 0   amLODipine (NORVASC) 5 MG tablet Take 1 tablet (5 mg total) by mouth daily. 90 tablet 3   amoxicillin (AMOXIL) 500 MG capsule Take 1 capsule (500 mg total) by mouth daily. 90 capsule 3    apixaban (ELIQUIS) 2.5 MG TABS tablet Take 1 tablet (2.5 mg total) by mouth 2 (two) times daily. Held as of 08/05/22     B-D UF III MINI PEN NEEDLES 31G X 5 MM MISC USE DAILY WITH INSULIN PEN 100 each 4   cholecalciferol (VITAMIN D3) 25 MCG (1000 UNIT) tablet Take 1,000 Units by mouth daily.     clotrimazole-betamethasone (LOTRISONE) cream APPLY TO AFFECTED AREA TWICE A DAY 30 g 0   dorzolamide-timolol (COSOPT) 22.3-6.8 MG/ML ophthalmic solution Place 1 drop into both eyes daily in the afternoon. In both eye     ferrous sulfate 325 (65 FE) MG tablet Take 1 tablet (325 mg total) by mouth once a week. 4 tablet 0   insulin glargine (LANTUS SOLOSTAR) 100 UNIT/ML Solostar Pen  Inject 17 Units into the skin daily. 15 mL 3   metoprolol succinate (TOPROL XL) 25 MG 24 hr tablet Take 1 tablet (25 mg total) by mouth at bedtime. 90 tablet 3   pantoprazole (PROTONIX) 40 MG tablet Take 1 tablet (40 mg total) by mouth 2 (two) times daily. 180 tablet 3   polyethylene glycol powder (GLYCOLAX/MIRALAX) 17 GM/SCOOP powder Take 17 g by mouth daily as needed. 3350 g 1   rosuvastatin (CRESTOR) 20 MG tablet Take 1 tablet (20 mg total) by mouth daily. 90 tablet 2   torsemide (DEMADEX) 10 MG tablet Take 1 tablet (10 mg total) by mouth 2 (two) times a week. Monday and Friday     No current facility-administered medications for this visit.     Review of Systems  Please see the history of present illness.    (+)*** (+)***  All other systems reviewed and are otherwise negative except as noted above.  Physical Exam    Wt Readings from Last 3 Encounters:  08/20/22 193 lb (87.5 kg)  08/13/22 198 lb 3.1 oz (89.9 kg)  08/07/22 198 lb (89.8 kg)   ZO:XWRUE were no vitals filed for this visit.,There is no height or weight on file to calculate BMI.  Constitutional:      Appearance: Healthy appearance. Not in distress.  Neck:     Vascular: JVD normal.  Pulmonary:     Effort: Pulmonary effort is normal.     Breath  sounds: No wheezing. No rales. Diminished in the bases Cardiovascular:     Normal rate. Regular rhythm. Normal S1. Normal S2.      Murmurs: There is no murmur.  Edema:    Peripheral edema absent.  Abdominal:     Palpations: Abdomen is soft non tender. There is no hepatomegaly.  Skin:    General: Skin is warm and dry.  Neurological:     General: No focal deficit present.     Mental Status: Alert and oriented to person, place and time.     Cranial Nerves: Cranial nerves are intact.  EKG/LABS/ Recent Cardiac Studies    ECG personally reviewed by me today - ***   Risk Assessment/Calculations:   {Does this patient have ATRIAL FIBRILLATION?:[phone]}        Lab Results  Component Value Date   WBC 5.2 08/20/2022   HGB 10.0 (L) 08/20/2022   HCT 30.6 (L) 08/20/2022   MCV 94.5 08/20/2022   PLT 94.0 (L) 08/20/2022   Lab Results  Component Value Date   CREATININE 2.53 (H) 08/20/2022   BUN 36 (H) 08/20/2022   NA 141 08/20/2022   K 4.9 08/20/2022   CL 110 08/20/2022   CO2 25 08/20/2022   Lab Results  Component Value Date   ALT 17 08/20/2022   AST 21 08/20/2022   ALKPHOS 108 08/20/2022   BILITOT 0.4 08/20/2022   Lab Results  Component Value Date   CHOL 148 12/17/2020   HDL 43.00 12/17/2020   LDLCALC 73 12/17/2020   LDLDIRECT 135.1 03/12/2014   TRIG 160.0 (H) 12/17/2020   CHOLHDL 3 12/17/2020    Lab Results  Component Value Date   HGBA1C 6.3 08/04/2022     Assessment & Plan    1.  Coronary artery disease: -s/p CABG times 10/11/1987 with last ischemic evaluation completed in 2012 by Myoview that was normal.  2.  HFpEF:  3.  Paroxysmal AF:  4.  History of CHB:  5.  Hyperlipidemia:  Disposition: Follow-up with Tobias Alexander, MD or APP in *** months {Are you ordering a CV Procedure (e.g. stress test, cath, DCCV, TEE, etc)?   Press F2        :[id number]}   Medication Adjustments/Labs and Tests Ordered: Current medicines are reviewed at length with  the patient today.  Concerns regarding medicines are outlined above.   Signed, Napoleon Form, Leodis Rains, NP 11/30/2022, 7:45 AM [facility]

## 2022-12-01 ENCOUNTER — Ambulatory Visit: Payer: Medicare Other | Attending: Nurse Practitioner | Admitting: Nurse Practitioner

## 2022-12-01 ENCOUNTER — Other Ambulatory Visit: Payer: Self-pay | Admitting: Family Medicine

## 2022-12-01 ENCOUNTER — Encounter: Payer: Self-pay | Admitting: Nurse Practitioner

## 2022-12-01 VITALS — BP 132/52 | HR 63 | Ht 71.0 in | Wt 182.8 lb

## 2022-12-01 DIAGNOSIS — I2510 Atherosclerotic heart disease of native coronary artery without angina pectoris: Secondary | ICD-10-CM | POA: Diagnosis not present

## 2022-12-01 DIAGNOSIS — I480 Paroxysmal atrial fibrillation: Secondary | ICD-10-CM | POA: Diagnosis not present

## 2022-12-01 DIAGNOSIS — I10 Essential (primary) hypertension: Secondary | ICD-10-CM | POA: Diagnosis not present

## 2022-12-01 DIAGNOSIS — E785 Hyperlipidemia, unspecified: Secondary | ICD-10-CM | POA: Diagnosis not present

## 2022-12-01 DIAGNOSIS — Z950 Presence of cardiac pacemaker: Secondary | ICD-10-CM

## 2022-12-01 DIAGNOSIS — I5032 Chronic diastolic (congestive) heart failure: Secondary | ICD-10-CM

## 2022-12-01 NOTE — Patient Instructions (Signed)
Medication Instructions:  Your physician recommends that you continue on your current medications as directed. Please refer to the Current Medication list given to you today. *If you need a refill on your cardiac medications before your next appointment, please call your pharmacy*   Lab Work: None ordered   Testing/Procedures: Your physician has requested that you have an echocardiogram. Echocardiography is a painless test that uses sound waves to create images of your heart. It provides your doctor with information about the size and shape of your heart and how well your heart's chambers and valves are working. This procedure takes approximately one hour. There are no restrictions for this procedure. Please do NOT wear cologne, perfume, aftershave, or lotions (deodorant is allowed). Please arrive 15 minutes prior to your appointment time.   Follow-Up: At [facility], you and your health needs are our priority.  As part of our continuing mission to provide you with exceptional heart care, we have created designated Provider Care Teams.  These Care Teams include your primary Cardiologist (physician) and Advanced Practice Providers (APPs -  Physician Assistants and Nurse Practitioners) who all work together to provide you with the care you need, when you need it.  We recommend signing up for the patient portal called "MyChart".  Sign up information is provided on this After Visit Summary.  MyChart is used to connect with patients for Virtual Visits (Telemedicine).  Patients are able to view lab/test results, encounter notes, upcoming appointments, etc.  Non-urgent messages can be sent to your provider as well.   To learn more about what you can do with MyChart, go to [email or web address].    Your next appointment:   4 month(s)  Provider:   Thurmon Fair, MD   Other Instructions Check your blood pressure daily for 2 weeks, then contact the office with your readings.  Make sure  to check 2 hours after your medications.   AVOID these things for 30 minutes before checking your blood pressure: No Drinking caffeine. No Drinking alcohol. No Eating. No Smoking. No Exercising.  Five minutes before checking your blood pressure: Pee. Sit in a dining chair. Avoid sitting in a soft couch or armchair. Be quiet. Do not talk.

## 2022-12-02 ENCOUNTER — Encounter: Payer: Self-pay | Admitting: Family Medicine

## 2022-12-02 NOTE — Telephone Encounter (Signed)
I spoke with Dean Murphy (DPR signed) who said last 2 - 3 wks can see some decline to Dean Murphy in Dean Murphy being more tired and winded when he walks any distance and soreness in his back.Dean Murphy just saw card on 12/01/22 and these symptoms were mentioned. Dean Murphy said Dean Murphy was scheduled for Echo on 12/25/22.Dean Murphy said Dean Murphy almost fell this morning and Dean Murphy thinks Dean Murphy might have tripped on something but Dean Murphy caught himself and Dean Murphy did not fall.. Today BP 141/61 P60 and card is monitoring BP. No H/A, no CP and Dean Murphy has equilibrium problems for approx 1 yr. Dean Murphy has not been monitoring BS. No UTI symptoms and normal BMs. Dean Murphy takes eliquis . Dean Murphy said I could call Dean Murphy and speak with him; Gabriel Rung said when he scheduled appt with Dr Ermalene Searing on 12/03/22 at 2 pm I spoke with Dean Murphy and he said he is 89 years old and he does have to rest sometimes when he is walking but the back soreness has been going on for awhile since Dean Murphy had back surgery. Dean Murphy said he has not had CP and no SOB when resting., Dean Murphy said has some swelling in lt ankle but no worse than usual. Dean Murphy said overall he feels OK for 89 yo.I asked Dean Murphy when last time cked BS and Dean Murphy said he has not cked BS in awhile. Dean Murphy checked BS while I was on phone and BS 406. At 1pm Dean Murphy ate quarter lb hamburger with fries and Cheewine from [employer]. Dean Murphy said he will drink more water this afternoon and will watch his diet more closely. Dean Murphy said no hyperglycemic  symptoms.Dean Murphy will ck his BS more often. Dean Murphy was made aware of pts BS.UC & ED precautions given again and Dean Murphy and Dean Murphy voiced understanding. Dean Murphy declined going to UC or ED now.Dr Para March has available appt on 12/03/22 at 12:30 and Dean Murphy said to change appt for Dean Murphy to see Dr Para March and Gabriel Rung will let Dean Murphy now also.sending note to Dr Para March and Allayne Gitelman NP.

## 2022-12-03 ENCOUNTER — Encounter: Payer: Self-pay | Admitting: Family Medicine

## 2022-12-03 ENCOUNTER — Ambulatory Visit: Payer: Medicare Other | Admitting: Family Medicine

## 2022-12-03 VITALS — BP 122/74 | HR 72 | Temp 97.4°F | Ht 71.0 in | Wt 182.0 lb

## 2022-12-03 DIAGNOSIS — Z794 Long term (current) use of insulin: Secondary | ICD-10-CM

## 2022-12-03 DIAGNOSIS — E1122 Type 2 diabetes mellitus with diabetic chronic kidney disease: Secondary | ICD-10-CM

## 2022-12-03 DIAGNOSIS — R0609 Other forms of dyspnea: Secondary | ICD-10-CM | POA: Diagnosis not present

## 2022-12-03 DIAGNOSIS — I480 Paroxysmal atrial fibrillation: Secondary | ICD-10-CM

## 2022-12-03 LAB — COMPREHENSIVE METABOLIC PANEL
ALT: 29 U/L (ref 0–53)
AST: 28 U/L (ref 0–37)
Albumin: 3.8 g/dL (ref 3.5–5.2)
Alkaline Phosphatase: 130 U/L — ABNORMAL HIGH (ref 39–117)
BUN: 54 mg/dL — ABNORMAL HIGH (ref 6–23)
CO2: 23 mEq/L (ref 19–32)
Calcium: 9.3 mg/dL (ref 8.4–10.5)
Chloride: 111 mEq/L (ref 96–112)
Creatinine, Ser: 2.93 mg/dL — ABNORMAL HIGH (ref 0.40–1.50)
GFR: 17.84 mL/min — ABNORMAL LOW (ref >60.00)
Glucose, Bld: 403 mg/dL — ABNORMAL HIGH (ref 70–99)
Potassium: 5.6 mEq/L — ABNORMAL HIGH (ref 3.5–5.1)
Sodium: 141 mEq/L (ref 135–145)
Total Bilirubin: 0.5 mg/dL (ref 0.2–1.2)
Total Protein: 5.8 g/dL — ABNORMAL LOW (ref 6.0–8.3)

## 2022-12-03 LAB — CBC WITH DIFFERENTIAL/PLATELET
Basophils Absolute: 0 10*3/uL (ref 0.0–0.1)
Basophils Relative: 0.8 % (ref 0.0–3.0)
Eosinophils Absolute: 0.1 10*3/uL (ref 0.0–0.7)
Eosinophils Relative: 2.9 % (ref 0.0–5.0)
HCT: 31.6 % — ABNORMAL LOW (ref 39.0–52.0)
Hemoglobin: 10.1 g/dL — ABNORMAL LOW (ref 13.0–17.0)
Lymphocytes Relative: 24.9 % (ref 12.0–46.0)
Lymphs Abs: 1.1 10*3/uL (ref 0.7–4.0)
MCHC: 32.1 g/dL (ref 30.0–36.0)
MCV: 90.2 fl (ref 78.0–100.0)
Monocytes Absolute: 0.3 10*3/uL (ref 0.1–1.0)
Monocytes Relative: 6.8 % (ref 3.0–12.0)
Neutro Abs: 2.9 10*3/uL (ref 1.4–7.7)
Neutrophils Relative %: 64.6 % (ref 43.0–77.0)
Platelets: 92 10*3/uL — ABNORMAL LOW (ref 150.0–400.0)
RBC: 3.5 Mil/uL — ABNORMAL LOW (ref 4.22–5.81)
RDW: 16.8 % — ABNORMAL HIGH (ref 11.5–15.5)
WBC: 4.5 10*3/uL (ref 4.0–10.5)

## 2022-12-03 LAB — TSH: TSH: 2.54 u[IU]/mL (ref 0.35–5.50)

## 2022-12-03 LAB — HM DIABETES EYE EXAM

## 2022-12-03 LAB — HEMOGLOBIN A1C: Hgb A1c MFr Bld: 13.4 % — ABNORMAL HIGH (ref 4.6–6.5)

## 2022-12-03 LAB — BRAIN NATRIURETIC PEPTIDE: Pro B Natriuretic peptide (BNP): 224 pg/mL — ABNORMAL HIGH (ref 0.0–100.0)

## 2022-12-03 MED ORDER — LANTUS SOLOSTAR 100 UNIT/ML ~~LOC~~ SOPN: 38.0000 [IU] | PEN_INJECTOR | Freq: Every day | SUBCUTANEOUS | Status: DC

## 2022-12-03 MED ORDER — APIXABAN 2.5 MG PO TABS: 2.5000 mg | ORAL_TABLET | Freq: Two times a day (BID) | ORAL | Status: DC

## 2022-12-03 NOTE — Patient Instructions (Signed)
Don't change your meds for now.  Go to the lab on the way out.   If you have mychart we'll likely use that to update you.   Take care.  Glad to see you. 

## 2022-12-03 NOTE — Progress Notes (Signed)
His wife needed a pacemaker this week.  He has been at the hospital with her.  "I'm slowing down."  He has DOE compared to prev baseline, ie rolling the garbage can to the street.  He had more back pain with prolonged walking in the hospital.    D/w pt about diet and carb intake.  Med list updated re: insulin.  Higher readings at home recently.   No blood in stool per patient report.  He feels better today than yesterday but not back to baseline.    Meds, vitals, and allergies reviewed.   ROS: Per HPI unless specifically indicated in ROS section   Nad Ncat Neck supple, no LA Rrr Ctab No BLE edema.  Abdomen soft. Skin well-perfused.

## 2022-12-05 DIAGNOSIS — R0609 Other forms of dyspnea: Secondary | ICD-10-CM | POA: Insufficient documentation

## 2022-12-05 NOTE — Assessment & Plan Note (Signed)
Of unclear source.  Broad differential discussed with patient.  See notes on labs.  At this point still okay for outpatient follow-up.  Discussed diet and carbohydrate intake, as this could be affecting his symptoms.  Routine cautions given to patient and son.  They agree with plan.

## 2022-12-06 LAB — FRUCTOSAMINE: Fructosamine: 569 umol/L — ABNORMAL HIGH (ref 205–285)

## 2022-12-07 ENCOUNTER — Encounter: Payer: Self-pay | Admitting: Family Medicine

## 2022-12-07 ENCOUNTER — Other Ambulatory Visit: Payer: Self-pay | Admitting: Family Medicine

## 2022-12-07 DIAGNOSIS — E1122 Type 2 diabetes mellitus with diabetic chronic kidney disease: Secondary | ICD-10-CM

## 2022-12-09 NOTE — Telephone Encounter (Signed)
Noted, prev addressed by cardiology.  I'll defer.

## 2022-12-10 NOTE — Progress Notes (Signed)
  Electrophysiology Office Note:   ID:  Garo, [place] [birth year], MRN [id number]  Primary Cardiologist: None Electrophysiologist: Lewayne Bunting, MD      History of Present Illness:   Dean Murphy is a 89 y.o. male with h/o CAD s/p CABG, CHB s/p PPM, carotid disease, CKD IV, and DM2 seen today for routine electrophysiology followup.   Since last being seen in our clinic the patient reports doing well overall.  he denies chest pain, palpitations, dyspnea, PND, orthopnea, nausea, vomiting, dizziness, syncope, edema, weight gain, or early satiety.   Review of systems complete and found to be negative unless listed in HPI.    EP Information / Studies Reviewed:    EKG is not ordered today. EKG from 08/08/2022 reviewed which showed asensed V paced 64 bpm   PPM Interrogation-  reviewed in detail today,  See PACEART report.  Device History: St. Jude Dual Chamber PPM implanted 07/2014 for CHB       Physical Exam:   VS:  BP (!) 142/60   Pulse 70   Ht 5\' 11"  (1.803 m)   Wt 184 lb 3.2 oz (83.6 kg)   SpO2 93%   BMI 25.69 kg/m    Wt Readings from Last 3 Encounters:  12/11/22 184 lb 3.2 oz (83.6 kg)  12/03/22 182 lb (82.6 kg)  12/01/22 182 lb 12.8 oz (82.9 kg)     GEN: Well nourished, well developed in no acute distress NECK: No JVD; No carotid bruits CARDIAC: Regular rate and rhythm, no murmurs, rubs, gallops RESPIRATORY:  Clear to auscultation without rales, wheezing or rhonchi  ABDOMEN: Soft, non-tender, non-distended EXTREMITIES:  No edema; No deformity   ASSESSMENT AND PLAN:    CHB s/p Abbott PPM  Normal PPM function See Pace Art report (scanned) No changes today Chronic RV (and RA) lead noise reversion. Not reproducible with isometric movements or pocket manipulation.   HTN Stable on current regimen   H/o knee infection On chronic suppressive ABx  CAD s/p CABG Denies s/s ischemia   Disposition:   Follow up with Dr. Ladona Ridgel in 6 months  Signed, Graciella Freer, PA-C

## 2022-12-11 ENCOUNTER — Ambulatory Visit: Payer: Medicare Other | Attending: Student | Admitting: Student

## 2022-12-11 ENCOUNTER — Encounter: Payer: Self-pay | Admitting: Student

## 2022-12-11 VITALS — BP 142/60 | HR 70 | Ht 71.0 in | Wt 184.2 lb

## 2022-12-11 DIAGNOSIS — I10 Essential (primary) hypertension: Secondary | ICD-10-CM | POA: Diagnosis not present

## 2022-12-11 DIAGNOSIS — I480 Paroxysmal atrial fibrillation: Secondary | ICD-10-CM

## 2022-12-11 DIAGNOSIS — I442 Atrioventricular block, complete: Secondary | ICD-10-CM

## 2022-12-11 DIAGNOSIS — I2510 Atherosclerotic heart disease of native coronary artery without angina pectoris: Secondary | ICD-10-CM

## 2022-12-11 DIAGNOSIS — Z951 Presence of aortocoronary bypass graft: Secondary | ICD-10-CM

## 2022-12-11 DIAGNOSIS — Z950 Presence of cardiac pacemaker: Secondary | ICD-10-CM

## 2022-12-11 NOTE — Patient Instructions (Signed)
Medication Instructions:  Your physician recommends that you continue on your current medications as directed. Please refer to the Current Medication list given to you today.  *If you need a refill on your cardiac medications before your next appointment, please call your pharmacy*  Lab Work: None ordered If you have labs (blood work) drawn today and your tests are completely normal, you will receive your results only by: MyChart Message (if you have MyChart) OR A paper copy in the mail If you have any lab test that is abnormal or we need to change your treatment, we will call you to review the results  Follow-Up: At [facility], you and your health needs are our priority.  As part of our continuing mission to provide you with exceptional heart care, we have created designated Provider Care Teams.  These Care Teams include your primary Cardiologist (physician) and Advanced Practice Providers (APPs -  Physician Assistants and Nurse Practitioners) who all work together to provide you with the care you need, when you need it.  Your next appointment:   6 month(s)  Provider:   Michael "Andy" Tillery, PA-C  

## 2022-12-15 ENCOUNTER — Other Ambulatory Visit (INDEPENDENT_AMBULATORY_CARE_PROVIDER_SITE_OTHER): Payer: Medicare Other

## 2022-12-15 DIAGNOSIS — E1122 Type 2 diabetes mellitus with diabetic chronic kidney disease: Secondary | ICD-10-CM | POA: Diagnosis not present

## 2022-12-15 LAB — BASIC METABOLIC PANEL
BUN: 42 mg/dL — ABNORMAL HIGH (ref 6–23)
CO2: 24 mEq/L (ref 19–32)
Calcium: 9.2 mg/dL (ref 8.4–10.5)
Chloride: 113 mEq/L — ABNORMAL HIGH (ref 96–112)
Creatinine, Ser: 2.65 mg/dL — ABNORMAL HIGH (ref 0.40–1.50)
GFR: 20.12 mL/min — ABNORMAL LOW (ref >60.00)
Glucose, Bld: 131 mg/dL — ABNORMAL HIGH (ref 70–99)
Potassium: 5.2 mEq/L — ABNORMAL HIGH (ref 3.5–5.1)
Sodium: 143 mEq/L (ref 135–145)

## 2022-12-25 ENCOUNTER — Ambulatory Visit (HOSPITAL_COMMUNITY): Payer: Medicare Other | Attending: Cardiology

## 2022-12-25 DIAGNOSIS — I10 Essential (primary) hypertension: Secondary | ICD-10-CM | POA: Insufficient documentation

## 2022-12-25 DIAGNOSIS — E785 Hyperlipidemia, unspecified: Secondary | ICD-10-CM | POA: Diagnosis not present

## 2022-12-25 DIAGNOSIS — Z950 Presence of cardiac pacemaker: Secondary | ICD-10-CM | POA: Insufficient documentation

## 2022-12-25 DIAGNOSIS — I5032 Chronic diastolic (congestive) heart failure: Secondary | ICD-10-CM | POA: Insufficient documentation

## 2022-12-25 DIAGNOSIS — I480 Paroxysmal atrial fibrillation: Secondary | ICD-10-CM | POA: Insufficient documentation

## 2022-12-25 DIAGNOSIS — I2510 Atherosclerotic heart disease of native coronary artery without angina pectoris: Secondary | ICD-10-CM | POA: Diagnosis not present

## 2022-12-25 LAB — ECHOCARDIOGRAM COMPLETE
Area-P 1/2: 2.42 cm2
S' Lateral: 4 cm

## 2022-12-28 ENCOUNTER — Other Ambulatory Visit: Payer: Self-pay | Admitting: Family Medicine

## 2023-01-11 ENCOUNTER — Ambulatory Visit (INDEPENDENT_AMBULATORY_CARE_PROVIDER_SITE_OTHER): Payer: Medicare Other

## 2023-01-11 VITALS — BP 142/88 | HR 62 | Ht 71.0 in | Wt 183.2 lb

## 2023-01-11 DIAGNOSIS — Z0000 Encounter for general adult medical examination without abnormal findings: Secondary | ICD-10-CM | POA: Diagnosis not present

## 2023-01-11 NOTE — Progress Notes (Signed)
Subjective:   Dean Murphy is a 89 y.o. male who presents for Medicare Annual/Subsequent preventive examination.  Visit Complete: In person   Review of Systems      Cardiac Risk Factors include: advanced age (>46men, >25 women);male gender;hypertension;dyslipidemia;diabetes mellitus;sedentary lifestyle     Objective:    Today's Vitals   01/11/23 1035  BP: (!) 142/88  Pulse: 62  SpO2: 94%  Weight: 183 lb 3.2 oz (83.1 kg)  Height: 5\' 11"  (1.803 m)   Body mass index is 25.55 kg/m.     01/11/2023   10:47 AM 08/10/2022    8:15 AM 08/07/2022    3:27 PM 01/27/2022   12:32 PM 12/15/2021   10:49 AM 12/12/2020    2:56 PM 07/12/2019    9:06 AM  Advanced Directives  Does Patient Have a Medical Advance Directive? Yes No No No No No No  Type of Estate agent of [place];Living will        Copy of Healthcare Power of Attorney in Chart? No - copy requested        Would patient like information on creating a medical advance directive?  No - Patient declined No - Patient declined No - Patient declined  No - Patient declined No - Patient declined    Current Medications (verified) Outpatient Encounter Medications as of 01/11/2023  Medication Sig   acetaminophen (TYLENOL) 500 MG tablet Take 2 tablets (1,000 mg total) by mouth 3 (three) times daily.   amLODipine (NORVASC) 5 MG tablet Take 1 tablet (5 mg total) by mouth daily.   amoxicillin (AMOXIL) 500 MG capsule Take 1 capsule (500 mg total) by mouth daily.   apixaban (ELIQUIS) 2.5 MG TABS tablet Take 1 tablet (2.5 mg total) by mouth 2 (two) times daily.   B-D UF III MINI PEN NEEDLES 31G X 5 MM MISC USE DAILY WITH INSULIN PEN   cholecalciferol (VITAMIN D3) 25 MCG (1000 UNIT) tablet Take 1,000 Units by mouth daily.   clotrimazole-betamethasone (LOTRISONE) cream APPLY TO AFFECTED AREA TWICE A DAY   dorzolamide-timolol (COSOPT) 22.3-6.8 MG/ML ophthalmic solution Place 1 drop into both eyes daily in the afternoon. In both eye    ferrous sulfate 325 (65 FE) MG tablet Take 1 tablet (325 mg total) by mouth once a week.   insulin glargine (LANTUS SOLOSTAR) 100 UNIT/ML Solostar Pen Inject 38 Units into the skin daily.   metoprolol succinate (TOPROL XL) 25 MG 24 hr tablet Take 1 tablet (25 mg total) by mouth at bedtime.   pantoprazole (PROTONIX) 40 MG tablet Take 1 tablet (40 mg total) by mouth 2 (two) times daily.   polyethylene glycol powder (GLYCOLAX/MIRALAX) 17 GM/SCOOP powder Take 17 g by mouth daily as needed.   rosuvastatin (CRESTOR) 20 MG tablet Take 1 tablet (20 mg total) by mouth daily.   torsemide (DEMADEX) 10 MG tablet Take 1 tablet (10 mg total) by mouth 2 (two) times a week. Monday and Friday   No facility-administered encounter medications on file as of 01/11/2023.    Allergies (verified) Zoledronic acid, Lokelma [sodium zirconium cyclosilicate], and Nsaids   History: Past Medical History:  Diagnosis Date   Anemia    Arthritis    CAD (coronary artery disease)    a. CABG 1989, b. Myoview low risk 2012.   Carotid artery occlusion    a. Duplex 10/2014: patent R/L CEA with mild hyperplasia in right surgical bulb - followed by vascular.   CHB (complete heart block) [facility]) March 2016  a. s/p STJ dual chamber pacemaker 07/2014.   Chronic diastolic CHF (congestive heart failure) (HCC)    a. Dx 07/2014 - acute diastolic CHF in the setting of CHB.   Chronic kidney disease, stage IV (severe) (HCC)    stage III/IV as of 2015, Dr Ella Jubilee Nephologist   Colon polyps    Complication of anesthesia    Constipation    CVA (cerebral infarction)    Diabetes mellitus    type II   Diverticulosis    Dupuytren's disease    finger right hand contracted   Essential hypertension    GERD (gastroesophageal reflux disease)    Hemorrhoids    Hyperlipidemia    Myocardial infarction (HCC) 1976  and Mar. 19, 2016   Orthostasis    Osteoporosis    PAF (paroxysmal atrial fibrillation) (HCC) March 2016   PONV (postoperative  nausea and vomiting)    Presence of permanent cardiac pacemaker    Shortness of breath dyspnea    due to pain   Past Surgical History:  Procedure Laterality Date   CARDIAC CATHETERIZATION  08/11/2014   Procedure: TEMPORARY PACEMAKER;  Surgeon: Runell Gess, MD;  Location: [facility] CATH LAB;  Service: Cardiovascular;;   CAROTID ENDARTERECTOMY  12/31/10   Right   CAROTID ENDARTERECTOMY  02/09/11   left   CHOLECYSTECTOMY     COLONOSCOPY WITH PROPOFOL N/A 08/10/2022   Procedure: COLONOSCOPY WITH PROPOFOL;  Surgeon: Napoleon Form, MD;  Location: [facility] ENDOSCOPY;  Service: Gastroenterology;  Laterality: N/A;   CORONARY ARTERY BYPASS GRAFT  1989   ENDOSCOPIC RETROGRADE CHOLANGIOPANCREATOGRAPHY (ERCP) WITH PROPOFOL N/A 01/27/2022   Procedure: ENDOSCOPIC RETROGRADE CHOLANGIOPANCREATOGRAPHY (ERCP) WITH PROPOFOL;  Surgeon: Meryl Dare, MD;  Location: [facility] ENDOSCOPY;  Service: Gastroenterology;  Laterality: N/A;   ESOPHAGOGASTRODUODENOSCOPY N/A 08/09/2022   Procedure: ESOPHAGOGASTRODUODENOSCOPY (EGD);  Surgeon: Sherrilyn Rist, MD;  Location: [facility] ENDOSCOPY;  Service: Gastroenterology;  Laterality: N/A;   EYE SURGERY Bilateral    Cataract with implants   HOT HEMOSTASIS N/A 08/10/2022   Procedure: HOT HEMOSTASIS (ARGON PLASMA COAGULATION/BICAP);  Surgeon: Napoleon Form, MD;  Location: [facility] ENDOSCOPY;  Service: Gastroenterology;  Laterality: N/A;   I & D KNEE WITH POLY EXCHANGE Left 07/29/2015   Procedure: IRRIGATION AND DEBRIDEMENT LEFT KNEE WITH POLY EXCHANGE;  Surgeon: Samson Frederic, MD;  Location: [facility] OR;  Service: Orthopedics;  Laterality: Left;   IR GENERIC HISTORICAL  03/04/2016   IR US GUIDE VASC ACCESS RIGHT 03/04/2016 Jolaine Click, MD [facility]INTER[facility]   IR GENERIC HISTORICAL  03/04/2016   IR FLUORO GUIDE CV LINE RIGHT 03/04/2016 Jolaine Click, MD [facility]   IR KYPHO LUMBAR INC FX REDUCE BONE BX UNI/BIL CANNULATION INC/IMAGING  08/13/2022   JOINT REPLACEMENT     bilat. knees   PERMANENT  PACEMAKER INSERTION N/A 08/13/2014   STJ dual chamber pacemaker implanted by Dr Ladona Ridgel for CHB   REMOVAL OF STONES  01/27/2022   Procedure: REMOVAL OF STONES;  Surgeon: Meryl Dare, MD;  Location: [facility] ENDOSCOPY;  Service: Gastroenterology;;   Dennison Mascot  01/27/2022   Procedure: Dennison Mascot;  Surgeon: Meryl Dare, MD;  Location: [facility] ENDOSCOPY;  Service: Gastroenterology;;   TONSILLECTOMY     TOTAL KNEE ARTHROPLASTY     bilateral   Family History  Problem Relation Age of Onset   Heart disease Mother        Before age 31   Diabetes Mother    Varicose Veins Mother    Heart attack Mother  Heart attack Father    Heart disease Father        After age 27   Hypertension Father    Heart disease Brother        Before age 60   Hypertension Brother    Heart attack Brother    Diabetes Son    Hypertension Son    Hypertension Son    Diabetes Son    Diabetes Other    Cancer Other    Colon cancer Neg Hx    Social History   Socioeconomic History   Marital status: Married    Spouse name: Not on file   Number of children: Not on file   Years of education: Not on file   Highest education level: Not on file  Occupational History   Occupation: retired    Associate Professor: RETIRED  Tobacco Use   Smoking status: Former    Current packs/day: 0.00    Average packs/day: 1 pack/day for 25.0 years (25.0 ttl pk-yrs)    Types: Cigarettes    Start date: 01/22/1948    Quit date: 01/21/1973    Years since quitting: 50.0    Passive exposure: Past   Smokeless tobacco: Never   Tobacco comments:    began smoking in high school, quit age 19  Vaping Use   Vaping status: Never Used  Substance and Sexual Activity   Alcohol use: Yes    Alcohol/week: 0.0 standard drinks of alcohol    Comment: wine occ   Drug use: No   Sexual activity: Never  Other Topics Concern   Not on file  Social History Narrative   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  Remarried  1998.     3 sons   Social Determinants of Health   Financial Resource Strain: Low Risk  (01/11/2023)   Overall Financial Resource Strain (CARDIA)    Difficulty of Paying Living Expenses: Not hard at all  Food Insecurity: No Food Insecurity (01/11/2023)   Hunger Vital Sign    Worried About Running Out of Food in the Last Year: Never true    Ran Out of Food in the Last Year: Never true  Transportation Needs: No Transportation Needs (01/11/2023)   PRAPARE - Administrator, Civil Service (Medical): No    Lack of Transportation (Non-Medical): No  Physical Activity: Inactive (01/11/2023)   Exercise Vital Sign    Days of Exercise per Week: 0 days    Minutes of Exercise per Session: 0 min  Stress: No Stress Concern Present (01/11/2023)   [employer] of Occupational Health - Occupational Stress Questionnaire    Feeling of Stress : Not at all  Social Connections: Moderately Integrated (01/11/2023)   Social Connection and Isolation Panel [NHANES]    Frequency of Communication with Friends and Family: More than three times a week    Frequency of Social Gatherings with Friends and Family: More than three times a week    Attends Religious Services: More than 4 times per year    Active Member of [employer] or Organizations: No    Attends Banker Meetings: Never    Marital Status: Married    Tobacco Counseling Counseling given: Not Answered Tobacco comments: began smoking in high school, quit age 70   Clinical Intake:  Pre-visit preparation completed: Yes  Pain : No/denies pain     BMI - recorded: 25.55 Nutritional Status: BMI 25 -29 Overweight Nutritional Risks: None  Diabetes: Yes CBG done?: Yes (pt does not remember) CBG resulted in Enter/ Edit results?: No Did pt. bring in CBG monitor from home?: No  How often do you need to have someone help you when you read instructions, pamphlets, or other written materials from your doctor or pharmacy?: 1 -  Never  Interpreter Needed?: No  Information entered by :: C.Drue Camera LPN   Activities of Daily Living    01/11/2023   10:49 AM 08/07/2022    5:41 PM  In your present state of health, do you have any difficulty performing the following activities:  Hearing? 0   Vision? 0   Difficulty concentrating or making decisions? 0   Walking or climbing stairs? 1   Comment uses cane   Dressing or bathing? 0   Doing errands, shopping? 0 0  Preparing Food and eating ? N   Using the Toilet? N   In the past six months, have you accidently leaked urine? Y   Comment a little bit   Do you have problems with loss of bowel control? N   Managing your Medications? N   Managing your Finances? N   Housekeeping or managing your Housekeeping? N     Patient Care Team: Joaquim Nam, MD as PCP - General (Family Medicine) Marinus Maw, MD as PCP - Electrophysiology (Cardiology) Mosetta Pigeon, MD (Internal Medicine) Antony Contras, MD as Consulting Physician (Ophthalmology) Breck Coons, DDS as Referring Physician (Dentistry) Arminda Resides, MD as Consulting Physician (Dermatology) Samson Frederic, MD as Consulting Physician (Orthopedic Surgery) Marinus Maw, MD as Consulting Physician (Cardiology) Cliffton Asters, MD (Inactive) as Consulting Physician (Infectious Diseases) Joaquim Nam, MD as Consulting Physician (Family Medicine) Croitoru, Rachelle Hora, MD as Consulting Physician (Cardiology)  Indicate any recent Medical Services you may have received from other than [facility] providers in the past year (date may be approximate).     Assessment:   This is a routine wellness examination for Dean Murphy.  Hearing/Vision screen Vision Screening - Comments:: Glasses - Dr.Lyles - UTD with eye exams  Dietary issues and exercise activities discussed:     Goals Addressed             This Visit's Progress    Patient Stated       Stay active       Depression Screen    01/11/2023   10:38 AM  12/03/2022   12:22 PM 08/20/2022    9:35 AM 08/07/2022   10:58 AM 08/04/2022   10:46 AM 07/22/2022    9:12 AM 12/15/2021   10:50 AM  PHQ 2/9 Scores  PHQ - 2 Score 0 0 0 1 1 0 0  PHQ- 9 Score 0 1 4 3 5       Fall Risk    01/11/2023   10:41 AM 12/03/2022   12:22 PM 08/20/2022    9:35 AM 08/07/2022   10:57 AM 08/04/2022   10:46 AM  Fall Risk   Falls in the past year? 1 0 1 1 1   Number falls in past yr: 0 0 0 0 0  Injury with Fall? 1 0 1 1 1   Comment bruised hand      Risk for fall due to : Impaired balance/gait No Fall Risks Impaired balance/gait Impaired balance/gait History of fall(s)  Risk for fall due to: Comment uses cane      Follow up Falls prevention discussed;Falls evaluation completed;Education provided Falls evaluation completed Falls evaluation completed;Falls prevention discussed Falls evaluation completed Falls evaluation completed  MEDICARE RISK AT HOME: Medicare Risk at Home Any stairs in or around the home?: No If so, are there any without handrails?: No Home free of loose throw rugs in walkways, pet beds, electrical cords, etc?: Yes Adequate lighting in your home to reduce risk of falls?: Yes Life alert?: Yes Use of a cane, walker or w/c?: Yes (cane) Grab bars in the bathroom?: Yes Shower chair or bench in shower?: Yes Elevated toilet seat or a handicapped toilet?: Yes  TIMED UP AND GO:  Was the test performed?  Yes  Length of time to ambulate 10 feet: 12 sec Gait steady and fast with assistive device    Cognitive Function:    12/12/2020    3:02 PM 07/12/2019    9:11 AM 07/07/2018    8:07 AM 06/04/2016    1:29 PM  MMSE - Mini Mental State Exam  Orientation to time 5 4 5 5   Orientation to Place 5 5 5 5   Registration 3 3 3 3   Attention/ Calculation 5 0 0 0  Recall 3 3 3 3   Language- name 2 objects   0 0  Language- repeat 1 1 1 1   Language- follow 3 step command   3 3  Language- read & follow direction   0 0  Write a sentence   0 0  Copy design   0 0   Total score   20 20        01/11/2023   10:51 AM 12/15/2021   10:52 AM  6CIT Screen  What Year? 0 points 0 points  What month? 0 points 0 points  What time? 0 points 0 points  Count back from 20 0 points 0 points  Months in reverse 0 points 4 points  Repeat phrase 0 points 2 points  Total Score 0 points 6 points    Immunizations Immunization History  Administered Date(s) Administered   Fluad Quad(high Dose 65+) 01/20/2019, 02/06/2022   Influenza Split 02/03/2012   Influenza Whole 03/16/2007, 01/23/2010   Influenza, High Dose Seasonal PF 03/27/2013, 02/17/2018, 03/08/2020, 03/05/2021   Influenza,inj,Quad PF,6+ Mos 03/15/2014, 03/15/2015, 02/27/2016   Influenza-Unspecified 03/19/2017, 02/17/2018   Moderna SARS-COV2 Booster Vaccination 04/26/2020   Moderna Sars-Covid-2 Vaccination 06/07/2019, 07/05/2019   Pfizer Covid-19 Vaccine Bivalent Booster 19yrs & up 04/23/2021   Pneumococcal Conjugate-13 06/24/2015   Pneumococcal Polysaccharide-23 09/24/2009   Td 07/26/2008   Zoster Recombinant(Shingrix) 01/06/2021, 04/14/2021    TDAP status: Due, Education has been provided regarding the importance of this vaccine. Advised may receive this vaccine at local pharmacy or Health Dept. Aware to provide a copy of the vaccination record if obtained from local pharmacy or Health Dept. Verbalized acceptance and understanding.  Flu Vaccine status: Due, Education has been provided regarding the importance of this vaccine. Advised may receive this vaccine at local pharmacy or Health Dept. Aware to provide a copy of the vaccination record if obtained from local pharmacy or Health Dept. Verbalized acceptance and understanding.  Pneumococcal vaccine status: Up to date  Covid-19 vaccine status: Declined, Education has been provided regarding the importance of this vaccine but patient still declined. Advised may receive this vaccine at local pharmacy or Health Dept.or vaccine clinic. Aware to provide a  copy of the vaccination record if obtained from local pharmacy or Health Dept. Verbalized acceptance and understanding.  Qualifies for Shingles Vaccine? Yes   Zostavax completed  unknown   Shingrix Completed?: Yes  Screening Tests Health Maintenance  Topic Date Due   DTaP/Tdap/Td (2 -  Tdap) 07/27/2018   COVID-19 Vaccine (5 - 2023-24 season) 01/23/2022   FOOT EXAM  12/19/2022   INFLUENZA VACCINE  12/24/2022   HEMOGLOBIN A1C  06/05/2023   OPHTHALMOLOGY EXAM  12/03/2023   Medicare Annual Wellness (AWV)  01/11/2024   Pneumonia Vaccine 32+ Years old  Completed   Zoster Vaccines- Shingrix  Completed   HPV VACCINES  Aged Out    Health Maintenance  Health Maintenance Due  Topic Date Due   DTaP/Tdap/Td (2 - Tdap) 07/27/2018   COVID-19 Vaccine (5 - 2023-24 season) 01/23/2022   FOOT EXAM  12/19/2022   INFLUENZA VACCINE  12/24/2022    Colorectal cancer screening: No longer required.   Lung Cancer Screening: (Low Dose CT Chest recommended if Age 23-80 years, 20 pack-year currently smoking OR have quit w/in 15years.) does not qualify.   Lung Cancer Screening Referral:    Additional Screening:  Hepatitis C Screening: does not qualify; Completed    Vision Screening: Recommended annual ophthalmology exams for early detection of glaucoma and other disorders of the eye. Is the patient up to date with their annual eye exam?  Yes  Who is the provider or what is the name of the office in which the patient attends annual eye exams? Dr.Lyles If pt is not established with a provider, would they like to be referred to a provider to establish care? Yes .   Dental Screening: Recommended annual dental exams for proper oral hygiene  Diabetic Foot Exam: Diabetic Foot Exam: Completed 12/18/21 DUE  Community Resource Referral / Chronic Care Management: CRR required this visit?  No   CCM required this visit?  No     Plan:     I have personally reviewed and noted the following in the  patient's chart:   Medical and social history Use of alcohol, tobacco or illicit drugs  Current medications and supplements including opioid prescriptions. Patient is not currently taking opioid prescriptions. Functional ability and status Nutritional status Physical activity Advanced directives List of other physicians Hospitalizations, surgeries, and ER visits in previous 12 months Vitals Screenings to include cognitive, depression, and falls Referrals and appointments  In addition, I have reviewed and discussed with patient certain preventive protocols, quality metrics, and best practice recommendations. A written personalized care plan for preventive services as well as general preventive health recommendations were provided to patient.     Maryan Puls, LPN   2/84/1324   After Visit Summary: Declined, Copy available in MyChart  Nurse Notes: none

## 2023-01-11 NOTE — Patient Instructions (Signed)
[name] , Thank you for taking time to come for your Medicare Wellness Visit. I appreciate your ongoing commitment to your health goals. Please review the following plan we discussed and let me know if I can assist you in the future.   Referrals/Orders/Follow-Ups/Clinician Recommendations: Aim for 30 minutes of exercise or brisk walking, 6-8 glasses of water, and 5 servings of fruits and vegetables each day.   This is a list of the screening recommended for you and due dates:  Health Maintenance  Topic Date Due   DTaP/Tdap/Td vaccine (2 - Tdap) 07/27/2018   COVID-19 Vaccine (5 - 2023-24 season) 01/23/2022   Medicare Annual Wellness Visit  12/16/2022   Complete foot exam   12/19/2022   Flu Shot  12/24/2022   Hemoglobin A1C  06/05/2023   Eye exam for diabetics  12/03/2023   Pneumonia Vaccine  Completed   Zoster (Shingles) Vaccine  Completed   HPV Vaccine  Aged Out    Advanced directives: (Copy Requested) Please bring a copy of your health care power of attorney and living will to the office to be added to your chart at your convenience.  Next Medicare Annual Wellness Visit scheduled for next year: Yes  Preventive Care 43 Years and Older, Male  Preventive care refers to lifestyle choices and visits with your health care provider that can promote health and wellness. What does preventive care include? A yearly physical exam. This is also called an annual well check. Dental exams once or twice a year. Routine eye exams. Ask your health care provider how often you should have your eyes checked. Personal lifestyle choices, including: Daily care of your teeth and gums. Regular physical activity. Eating a healthy diet. Avoiding tobacco and drug use. Limiting alcohol use. Practicing safe sex. Taking low doses of aspirin every day. Taking vitamin and mineral supplements as recommended by your health care provider. What happens during an annual well check? The services and screenings  done by your health care provider during your annual well check will depend on your age, overall health, lifestyle risk factors, and family history of disease. Counseling  Your health care provider may ask you questions about your: Alcohol use. Tobacco use. Drug use. Emotional well-being. Home and relationship well-being. Sexual activity. Eating habits. History of falls. Memory and ability to understand (cognition). Work and work Astronomer. Screening  You may have the following tests or measurements: Height, weight, and BMI. Blood pressure. Lipid and cholesterol levels. These may be checked every 5 years, or more frequently if you are over 25 years old. Skin check. Lung cancer screening. You may have this screening every year starting at age 74 if you have a 30-pack-year history of smoking and currently smoke or have quit within the past 15 years. Fecal occult blood test (FOBT) of the stool. You may have this test every year starting at age 8. Flexible sigmoidoscopy or colonoscopy. You may have a sigmoidoscopy every 5 years or a colonoscopy every 10 years starting at age 41. Prostate cancer screening. Recommendations will vary depending on your family history and other risks. Hepatitis C blood test. Hepatitis B blood test. Sexually transmitted disease (STD) testing. Diabetes screening. This is done by checking your blood sugar (glucose) after you have not eaten for a while (fasting). You may have this done every 1-3 years. Abdominal aortic aneurysm (AAA) screening. You may need this if you are a current or former smoker. Osteoporosis. You may be screened starting at age 20 if you are  at high risk. Talk with your health care provider about your test results, treatment options, and if necessary, the need for more tests. Vaccines  Your health care provider may recommend certain vaccines, such as: Influenza vaccine. This is recommended every year. Tetanus, diphtheria, and acellular  pertussis (Tdap, Td) vaccine. You may need a Td booster every 10 years. Zoster vaccine. You may need this after age 5. Pneumococcal 13-valent conjugate (PCV13) vaccine. One dose is recommended after age 71. Pneumococcal polysaccharide (PPSV23) vaccine. One dose is recommended after age 66. Talk to your health care provider about which screenings and vaccines you need and how often you need them. This information is not intended to replace advice given to you by your health care provider. Make sure you discuss any questions you have with your health care provider. Document Released: 06/07/2015 Document Revised: 01/29/2016 Document Reviewed: 03/12/2015 Elsevier Interactive Patient Education  2017 [employer].  Fall Prevention in the Home Falls can cause injuries. They can happen to people of all ages. There are many things you can do to make your home safe and to help prevent falls. What can I do on the outside of my home? Regularly fix the edges of walkways and driveways and fix any cracks. Remove anything that might make you trip as you walk through a door, such as a raised step or threshold. Trim any bushes or trees on the path to your home. Use bright outdoor lighting. Clear any walking paths of anything that might make someone trip, such as rocks or tools. Regularly check to see if handrails are loose or broken. Make sure that both sides of any steps have handrails. Any raised decks and porches should have guardrails on the edges. Have any leaves, snow, or ice cleared regularly. Use sand or salt on walking paths during winter. Clean up any spills in your garage right away. This includes oil or grease spills. What can I do in the bathroom? Use night lights. Install grab bars by the toilet and in the tub and shower. Do not use towel bars as grab bars. Use non-skid mats or decals in the tub or shower. If you need to sit down in the shower, use a plastic, non-slip stool. Keep the floor  dry. Clean up any water that spills on the floor as soon as it happens. Remove soap buildup in the tub or shower regularly. Attach bath mats securely with double-sided non-slip rug tape. Do not have throw rugs and other things on the floor that can make you trip. What can I do in the bedroom? Use night lights. Make sure that you have a light by your bed that is easy to reach. Do not use any sheets or blankets that are too big for your bed. They should not hang down onto the floor. Have a firm chair that has side arms. You can use this for support while you get dressed. Do not have throw rugs and other things on the floor that can make you trip. What can I do in the kitchen? Clean up any spills right away. Avoid walking on wet floors. Keep items that you use a lot in easy-to-reach places. If you need to reach something above you, use a strong step stool that has a grab bar. Keep electrical cords out of the way. Do not use floor polish or wax that makes floors slippery. If you must use wax, use non-skid floor wax. Do not have throw rugs and other things on the floor  that can make you trip. What can I do with my stairs? Do not leave any items on the stairs. Make sure that there are handrails on both sides of the stairs and use them. Fix handrails that are broken or loose. Make sure that handrails are as long as the stairways. Check any carpeting to make sure that it is firmly attached to the stairs. Fix any carpet that is loose or worn. Avoid having throw rugs at the top or bottom of the stairs. If you do have throw rugs, attach them to the floor with carpet tape. Make sure that you have a light switch at the top of the stairs and the bottom of the stairs. If you do not have them, ask someone to add them for you. What else can I do to help prevent falls? Wear shoes that: Do not have high heels. Have rubber bottoms. Are comfortable and fit you well. Are closed at the toe. Do not wear  sandals. If you use a stepladder: Make sure that it is fully opened. Do not climb a closed stepladder. Make sure that both sides of the stepladder are locked into place. Ask someone to hold it for you, if possible. Clearly mark and make sure that you can see: Any grab bars or handrails. First and last steps. Where the edge of each step is. Use tools that help you move around (mobility aids) if they are needed. These include: Canes. Walkers. Scooters. Crutches. Turn on the lights when you go into a dark area. Replace any light bulbs as soon as they burn out. Set up your furniture so you have a clear path. Avoid moving your furniture around. If any of your floors are uneven, fix them. If there are any pets around you, be aware of where they are. Review your medicines with your doctor. Some medicines can make you feel dizzy. This can increase your chance of falling. Ask your doctor what other things that you can do to help prevent falls. This information is not intended to replace advice given to you by your health care provider. Make sure you discuss any questions you have with your health care provider. Document Released: 03/07/2009 Document Revised: 10/17/2015 Document Reviewed: 06/15/2014 Elsevier Interactive Patient Education  2017 [employer].

## 2023-01-25 ENCOUNTER — Other Ambulatory Visit: Payer: Self-pay | Admitting: Family Medicine

## 2023-01-26 ENCOUNTER — Ambulatory Visit (INDEPENDENT_AMBULATORY_CARE_PROVIDER_SITE_OTHER): Payer: Medicare Other

## 2023-01-26 DIAGNOSIS — I442 Atrioventricular block, complete: Secondary | ICD-10-CM

## 2023-01-26 LAB — CUP PACEART REMOTE DEVICE CHECK
Battery Remaining Longevity: 11 mo
Battery Remaining Percentage: 14 %
Battery Voltage: 2.87 V
Brady Statistic AP VP Percent: 23 %
Brady Statistic AP VS Percent: 1 %
Brady Statistic AS VP Percent: 77 %
Brady Statistic AS VS Percent: 1 %
Brady Statistic RA Percent Paced: 22 %
Brady Statistic RV Percent Paced: 99 %
Date Time Interrogation Session: 20240903020016
Implantable Lead Connection Status: 753985
Implantable Lead Connection Status: 753985
Implantable Lead Implant Date: 20160321
Implantable Lead Implant Date: 20160321
Implantable Lead Location: 753859
Implantable Lead Location: 753860
Implantable Pulse Generator Implant Date: 20160321
Lead Channel Impedance Value: 350 Ohm
Lead Channel Impedance Value: 360 Ohm
Lead Channel Pacing Threshold Amplitude: 1 V
Lead Channel Pacing Threshold Amplitude: 1 V
Lead Channel Pacing Threshold Pulse Width: 0.5 ms
Lead Channel Pacing Threshold Pulse Width: 0.8 ms
Lead Channel Sensing Intrinsic Amplitude: 3.4 mV
Lead Channel Sensing Intrinsic Amplitude: 9.4 mV
Lead Channel Setting Pacing Amplitude: 2.5 V
Lead Channel Setting Pacing Amplitude: 3 V
Lead Channel Setting Pacing Pulse Width: 0.5 ms
Lead Channel Setting Sensing Sensitivity: 8 mV
Pulse Gen Model: 2240
Pulse Gen Serial Number: 7734710

## 2023-02-03 NOTE — Progress Notes (Signed)
Remote pacemaker transmission.   

## 2023-02-28 ENCOUNTER — Other Ambulatory Visit: Payer: Self-pay | Admitting: Family Medicine

## 2023-03-23 ENCOUNTER — Other Ambulatory Visit: Payer: Self-pay | Admitting: Family Medicine

## 2023-03-23 NOTE — Telephone Encounter (Signed)
Prescription Request  03/23/2023  LOV: 12/03/2022  What is the name of the medication or equipment? clotrimazole-betamethasone (LOTRISONE) cream   Have you contacted your pharmacy to request a refill? Yes   Which pharmacy would you like this sent to?   [id number] [place], [place] - [address] [place] [address] Phone: [phone] Fax: [phone]    Patient notified that their request is being sent to the clinical staff for review and that they should receive a response within 2 business days.   Please advise at [facility] [phone]

## 2023-03-24 MED ORDER — CLOTRIMAZOLE-BETAMETHASONE 1-0.05 % EX CREA: TOPICAL_CREAM | Freq: Two times a day (BID) | CUTANEOUS | 1 refills | Status: DC | PRN

## 2023-03-24 NOTE — Telephone Encounter (Signed)
LOV: 12/03/2022 NV: Nothing scheduled LAST REFILL: 07/09/2021

## 2023-04-09 ENCOUNTER — Encounter: Payer: Self-pay | Admitting: Family Medicine

## 2023-04-11 ENCOUNTER — Telehealth: Payer: Self-pay | Admitting: Family Medicine

## 2023-04-11 NOTE — Telephone Encounter (Signed)
Please triage patient about recent fatigue, history of diabetes.  See MyChart message.  He likely needs labs at an office visit in the near future.  Thanks.

## 2023-04-12 NOTE — Telephone Encounter (Signed)
Dean Murphy (DPR signed) called in Dean Murphy got busy and will see pt on 04/13/23 and will ck FBS then. Dean Murphy said pt is progressively getting weaker and more tired last couple of weeks and pt thinks due to his age.Dean Murphy said pt does not need to go to [facility] or ED and  does not want to schedule appt at this time with Dr Para March. Pt has appt with kidney doctor on 04/14/23 and is to have blood work done there. Dean Murphy will keep Dr Para March posted on  how pt is doing and if needed will schedule appt with Dr Para March.sending note to Dr Para March and Para March pool.

## 2023-04-12 NOTE — Telephone Encounter (Signed)
Unable to reach Dean Murphy (DPR signed) by phone and left v/m for Dean Murphy to cb. Will also leave my chart note for Dean Murphy to cb. Sending note toi lsc triage.

## 2023-04-13 NOTE — Telephone Encounter (Signed)
Noted. Thanks.

## 2023-04-21 ENCOUNTER — Other Ambulatory Visit: Payer: Self-pay | Admitting: Internal Medicine

## 2023-04-21 DIAGNOSIS — I480 Paroxysmal atrial fibrillation: Secondary | ICD-10-CM

## 2023-04-25 NOTE — Telephone Encounter (Signed)
Prescription refill request for Eliquis received. Indication: a fib Last office visit: 12/11/22 Scr: 2.65 12/15/22 epic Age: 89 Weight: 83 kg

## 2023-04-27 ENCOUNTER — Ambulatory Visit (INDEPENDENT_AMBULATORY_CARE_PROVIDER_SITE_OTHER): Payer: Medicare Other

## 2023-04-27 DIAGNOSIS — I442 Atrioventricular block, complete: Secondary | ICD-10-CM

## 2023-04-27 LAB — CUP PACEART REMOTE DEVICE CHECK
Battery Remaining Longevity: 10 mo
Battery Remaining Percentage: 12 %
Battery Voltage: 2.86 V
Brady Statistic AP VP Percent: 17 %
Brady Statistic AP VS Percent: 1 %
Brady Statistic AS VP Percent: 83 %
Brady Statistic AS VS Percent: 1 %
Brady Statistic RA Percent Paced: 17 %
Brady Statistic RV Percent Paced: 99 %
Date Time Interrogation Session: 20241203020015
Implantable Lead Connection Status: 753985
Implantable Lead Connection Status: 753985
Implantable Lead Implant Date: 20160321
Implantable Lead Implant Date: 20160321
Implantable Lead Location: 753859
Implantable Lead Location: 753860
Implantable Pulse Generator Implant Date: 20160321
Lead Channel Impedance Value: 310 Ohm
Lead Channel Impedance Value: 350 Ohm
Lead Channel Pacing Threshold Amplitude: 1 V
Lead Channel Pacing Threshold Amplitude: 1 V
Lead Channel Pacing Threshold Pulse Width: 0.5 ms
Lead Channel Pacing Threshold Pulse Width: 0.8 ms
Lead Channel Sensing Intrinsic Amplitude: 11 mV
Lead Channel Sensing Intrinsic Amplitude: 2.8 mV
Lead Channel Setting Pacing Amplitude: 2.5 V
Lead Channel Setting Pacing Amplitude: 3 V
Lead Channel Setting Pacing Pulse Width: 0.5 ms
Lead Channel Setting Sensing Sensitivity: 8 mV
Pulse Gen Model: 2240
Pulse Gen Serial Number: 7734710

## 2023-05-11 ENCOUNTER — Ambulatory Visit: Payer: Medicare Other | Attending: Cardiovascular Disease | Admitting: Cardiovascular Disease

## 2023-05-11 VITALS — BP 138/60 | HR 65 | Ht 71.0 in | Wt 189.0 lb

## 2023-05-11 DIAGNOSIS — I480 Paroxysmal atrial fibrillation: Secondary | ICD-10-CM

## 2023-05-11 DIAGNOSIS — D6869 Other thrombophilia: Secondary | ICD-10-CM

## 2023-05-11 DIAGNOSIS — I5032 Chronic diastolic (congestive) heart failure: Secondary | ICD-10-CM | POA: Diagnosis not present

## 2023-05-11 DIAGNOSIS — E782 Mixed hyperlipidemia: Secondary | ICD-10-CM

## 2023-05-11 DIAGNOSIS — E1121 Type 2 diabetes mellitus with diabetic nephropathy: Secondary | ICD-10-CM

## 2023-05-11 DIAGNOSIS — I10 Essential (primary) hypertension: Secondary | ICD-10-CM

## 2023-05-11 DIAGNOSIS — N184 Chronic kidney disease, stage 4 (severe): Secondary | ICD-10-CM

## 2023-05-11 DIAGNOSIS — Z950 Presence of cardiac pacemaker: Secondary | ICD-10-CM

## 2023-05-11 DIAGNOSIS — E1122 Type 2 diabetes mellitus with diabetic chronic kidney disease: Secondary | ICD-10-CM

## 2023-05-11 DIAGNOSIS — D508 Other iron deficiency anemias: Secondary | ICD-10-CM

## 2023-05-11 DIAGNOSIS — N183 Chronic kidney disease, stage 3 unspecified: Secondary | ICD-10-CM

## 2023-05-11 DIAGNOSIS — I25810 Atherosclerosis of coronary artery bypass graft(s) without angina pectoris: Secondary | ICD-10-CM | POA: Diagnosis not present

## 2023-05-11 DIAGNOSIS — I441 Atrioventricular block, second degree: Secondary | ICD-10-CM

## 2023-05-11 DIAGNOSIS — Z9889 Other specified postprocedural states: Secondary | ICD-10-CM

## 2023-05-11 DIAGNOSIS — I739 Peripheral vascular disease, unspecified: Secondary | ICD-10-CM

## 2023-05-11 DIAGNOSIS — R0989 Other specified symptoms and signs involving the circulatory and respiratory systems: Secondary | ICD-10-CM

## 2023-05-11 LAB — CBC

## 2023-05-11 NOTE — Progress Notes (Signed)
Cardiology Office Note:    Date:  05/14/2023   ID:  Dean Murphy, DOB January 06, 1927, MRN [id number]  PCP:  Dean Nam, MD   [facility] Providers Cardiologist:  None Electrophysiologist:  Dean Bunting, MD     Referring MD: Dean Nam, MD   No chief complaint on file.   History of Present Illness:    Dean Murphy is a 89 y.o. male with a hx of multiple cardiovascular problems including coronary artery disease (previous MI, CABG 1989), history of stroke and bilateral carotid stenosis status post endarterectomy (2012), complete heart block s/p St Jude dual-chamber permanent pacemaker (Dean. Ladona Murphy, 2016), paroxysmal atrial fibrillation, HFpEF (primarily manifest in the setting of heart block), as well as CKD stage IV (most recent creatinine 2.7, Dean. Thedore Murphy), DM type II requiring insulin, HTN, HLP, GERD, history of prosthetic left knee infection on chronic antibiotic suppression, here to establish general cardiology care.  He is accompanied by his son.  Yesterday also met with his wife Dean Murphy who is to be my patient going forward as well.  Despite his multiple problems he feels quite well.  His only complaint is that of easy fatigue.  He feels tired, but denies dyspnea, angina, palpitations, dizziness or syncope.  Interrogation of his pacemaker Management consultant Assurity dual-chamber implanted 2016) shows estimated longevity of 8-12 months and all lead parameters are generally in optimal range except for slightly high atrial pacing threshold.  He has only required 17% atrial pacing, but has 100% ventricular pacing.  He has not had any true atrial fibrillation since March 2024 (overall prevalence less than 1%) , although he has had a few episodes of paroxysmal atrial tachycardia.  Most of these are nonsustained in the <10 s range.  Did have 1 longer episode lasting for just under 3 minutes.  The overall heart rate histogram distribution is good.   His most recent echocardiogram from  12/25/2022 showed normal left ventricular systolic function with EF 60 to 65% and without wall motion abnormalities (an older echo has suggested apical septal hypokinesis).  He has grade 1 diastolic dysfunction and mild aortic sclerosis without stenosis.  Past Medical History:  Diagnosis Date   Anemia    Arthritis    CAD (coronary artery disease)    a. CABG 1989, b. Myoview low risk 2012.   Carotid artery occlusion    a. Duplex 10/2014: patent R/L CEA with mild hyperplasia in right surgical bulb - followed by vascular.   CHB (complete heart block) [facility]) March 2016   a. s/p STJ dual chamber pacemaker 07/2014.   Chronic diastolic CHF (congestive heart failure) (HCC)    a. Dx 07/2014 - acute diastolic CHF in the setting of CHB.   Chronic kidney disease, stage IV (severe) (HCC)    stage III/IV as of 2015, Dean Murphy Nephologist   Colon polyps    Complication of anesthesia    Constipation    CVA (cerebral infarction)    Diabetes mellitus    type II   Diverticulosis    Dupuytren's disease    finger right hand contracted   Essential hypertension    GERD (gastroesophageal reflux disease)    Hemorrhoids    Hyperlipidemia    Myocardial infarction (HCC) 1976  and Mar. 19, 2016   Orthostasis    Osteoporosis    PAF (paroxysmal atrial fibrillation) (HCC) March 2016   PONV (postoperative nausea and vomiting)    Presence of permanent cardiac pacemaker    Shortness  of breath dyspnea    due to pain    Past Surgical History:  Procedure Laterality Date   CARDIAC CATHETERIZATION  08/11/2014   Procedure: TEMPORARY PACEMAKER;  Surgeon: Dean Gess, MD;  Location: [facility] CATH LAB;  Service: Cardiovascular;;   CAROTID ENDARTERECTOMY  12/31/10   Right   CAROTID ENDARTERECTOMY  02/09/11   left   CHOLECYSTECTOMY     COLONOSCOPY WITH PROPOFOL N/A 08/10/2022   Procedure: COLONOSCOPY WITH PROPOFOL;  Surgeon: Dean Form, MD;  Location: [facility] ENDOSCOPY;  Service: Gastroenterology;  Laterality: N/A;    CORONARY ARTERY BYPASS GRAFT  1989   ENDOSCOPIC RETROGRADE CHOLANGIOPANCREATOGRAPHY (ERCP) WITH PROPOFOL N/A 01/27/2022   Procedure: ENDOSCOPIC RETROGRADE CHOLANGIOPANCREATOGRAPHY (ERCP) WITH PROPOFOL;  Surgeon: Dean Dare, MD;  Location: [facility] ENDOSCOPY;  Service: Gastroenterology;  Laterality: N/A;   ESOPHAGOGASTRODUODENOSCOPY N/A 08/09/2022   Procedure: ESOPHAGOGASTRODUODENOSCOPY (EGD);  Surgeon: Dean Rist, MD;  Location: [facility] ENDOSCOPY;  Service: Gastroenterology;  Laterality: N/A;   EYE SURGERY Bilateral    Cataract with implants   HOT HEMOSTASIS N/A 08/10/2022   Procedure: HOT HEMOSTASIS (ARGON PLASMA COAGULATION/BICAP);  Surgeon: Dean Form, MD;  Location: [facility] ENDOSCOPY;  Service: Gastroenterology;  Laterality: N/A;   I & D KNEE WITH POLY EXCHANGE Left 07/29/2015   Procedure: IRRIGATION AND DEBRIDEMENT LEFT KNEE WITH POLY EXCHANGE;  Surgeon: Dean Frederic, MD;  Location: [facility] OR;  Service: Orthopedics;  Laterality: Left;   IR GENERIC HISTORICAL  03/04/2016   IR US GUIDE VASC ACCESS RIGHT 03/04/2016 Dean Click, MD [facility]INTER[facility]   IR GENERIC HISTORICAL  03/04/2016   IR FLUORO GUIDE CV LINE RIGHT 03/04/2016 Dean Click, MD [facility]   IR KYPHO LUMBAR INC FX REDUCE BONE BX UNI/BIL CANNULATION INC/IMAGING  08/13/2022   JOINT REPLACEMENT     bilat. knees   PERMANENT PACEMAKER INSERTION N/A 08/13/2014   STJ dual chamber pacemaker implanted by Dean Dean Murphy for CHB   REMOVAL OF STONES  01/27/2022   Procedure: REMOVAL OF STONES;  Surgeon: Dean Dare, MD;  Location: [facility] ENDOSCOPY;  Service: Gastroenterology;;   Dean Murphy  01/27/2022   Procedure: Dean Murphy;  Surgeon: Dean Dare, MD;  Location: [facility] ENDOSCOPY;  Service: Gastroenterology;;   TONSILLECTOMY     TOTAL KNEE ARTHROPLASTY     bilateral    Current Medications: Current Meds  Medication Sig   acetaminophen (TYLENOL) 500 MG tablet Take 2 tablets (1,000 mg total) by mouth 3 (three) times daily.   amLODipine  (NORVASC) 5 MG tablet Take 1 tablet (5 mg total) by mouth daily.   amoxicillin (AMOXIL) 500 MG capsule Take 1 capsule (500 mg total) by mouth daily.   B-D UF III MINI PEN NEEDLES 31G X 5 MM MISC USE DAILY WITH INSULIN PEN   cholecalciferol (VITAMIN D3) 25 MCG (1000 UNIT) tablet Take 1,000 Units by mouth daily.   clotrimazole-betamethasone (LOTRISONE) cream Apply topically 2 (two) times daily as needed.   dorzolamide-timolol (COSOPT) 22.3-6.8 MG/ML ophthalmic solution Place 1 drop into both eyes daily in the afternoon. In both eye   ELIQUIS 2.5 MG TABS tablet TAKE 1 TABLET BY MOUTH TWICE A DAY   insulin glargine (LANTUS SOLOSTAR) 100 UNIT/ML Solostar Pen Inject 38 Units into the skin daily.   metoprolol succinate (TOPROL XL) 25 MG 24 hr tablet Take 1 tablet (25 mg total) by mouth at bedtime.   pantoprazole (PROTONIX) 40 MG tablet Take 1 tablet (40 mg total) by mouth 2 (two) times daily.   polyethylene glycol  powder (GLYCOLAX/MIRALAX) 17 GM/SCOOP powder Take 17 g by mouth daily as needed.   rosuvastatin (CRESTOR) 20 MG tablet Take 1 tablet (20 mg total) by mouth daily.   torsemide (DEMADEX) 10 MG tablet Take 1 tablet (10 mg total) by mouth 2 (two) times a week. Monday and Friday     Allergies:   Zoledronic acid, Lokelma [sodium zirconium cyclosilicate], and Nsaids   Social History   Socioeconomic History   Marital status: Married    Spouse name: Not on file   Number of children: Not on file   Years of education: Not on file   Highest education level: Not on file  Occupational History   Occupation: retired    Associate Professor: RETIRED  Tobacco Use   Smoking status: Former    Current packs/day: 0.00    Average packs/day: 1 pack/day for 25.0 years (25.0 ttl pk-yrs)    Types: Cigarettes    Start date: 01/22/1948    Quit date: 01/21/1973    Years since quitting: 50.3    Passive exposure: Past   Smokeless tobacco: Never   Tobacco comments:    began smoking in high school, quit age 35  Vaping  Use   Vaping status: Never Used  Substance and Sexual Activity   Alcohol use: Yes    Alcohol/week: 0.0 standard drinks of alcohol    Comment: wine occ   Drug use: No   Sexual activity: Never  Other Topics Concern   Not on file  Social History Narrative   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  Remarried 1998.     3 sons   Social Drivers of Corporate investment banker Strain: Low Risk  (01/11/2023)   Overall Financial Resource Strain (CARDIA)    Difficulty of Paying Living Expenses: Not hard at all  Food Insecurity: No Food Insecurity (01/11/2023)   Hunger Vital Sign    Worried About Running Out of Food in the Last Year: Never true    Ran Out of Food in the Last Year: Never true  Transportation Needs: No Transportation Needs (01/11/2023)   PRAPARE - Administrator, Civil Service (Medical): No    Lack of Transportation (Non-Medical): No  Physical Activity: Inactive (01/11/2023)   Exercise Vital Sign    Days of Exercise per Week: 0 days    Minutes of Exercise per Session: 0 min  Stress: No Stress Concern Present (01/11/2023)   [employer] of Occupational Health - Occupational Stress Questionnaire    Feeling of Stress : Not at all  Social Connections: Moderately Integrated (01/11/2023)   Social Connection and Isolation Panel [NHANES]    Frequency of Communication with Friends and Family: More than three times a week    Frequency of Social Gatherings with Friends and Family: More than three times a week    Attends Religious Services: More than 4 times per year    Active Member of [employer] or Organizations: No    Attends Engineer, structural: Never    Marital Status: Married     Family History: The patient's family history includes Cancer in an other family member; Diabetes in his mother, son, son, and another family member; Heart attack in his brother, father, and mother; Heart disease in his brother, father, and mother;  Hypertension in his brother, father, son, and son; Varicose Veins in his mother. There is no history of Colon cancer.  ROS:   Please see  the history of present illness.     All other systems reviewed and are negative.  EKGs/Labs/Other Studies Reviewed:    The following studies were reviewed today:  EKG Interpretation Date/Time:  Tuesday May 11 2023 08:56:47 EST Ventricular Rate:  65 PR Interval:  202 QRS Duration:  186 QT Interval:  452 QTC Calculation: 470 R Axis:   -84  Text Interpretation: Atrial-sensed ventricular-paced rhythm No previous ECGs available Confirmed by Sandee Bernath ([id number]) on 05/11/2023 5:17:27 PM     Echocardiogram 12/25/2022   1. Left ventricular ejection fraction, by estimation, is 60 to 65%. The  left ventricle has normal function. The left ventricle has no regional  wall motion abnormalities. There is mild left ventricular hypertrophy.  Left ventricular diastolic parameters  are consistent with Grade I diastolic dysfunction (impaired relaxation).   2. Right ventricular systolic function is normal. The right ventricular  size is normal.   3. The mitral valve is normal in structure. Mild mitral valve  regurgitation. No evidence of mitral stenosis.   4. The aortic valve is tricuspid. There is mild calcification of the  aortic valve. There is mild thickening of the aortic valve. Aortic valve  regurgitation is not visualized. Aortic valve sclerosis is present, with  no evidence of aortic valve stenosis.   5. The inferior vena cava is normal in size with greater than 50%  respiratory variability, suggesting right atrial pressure of 3 mmHg.    Recent Labs: 08/08/2022: Magnesium 2.4 12/03/2022: ALT 29; Pro B Natriuretic peptide (BNP) 224.0; TSH 2.54 05/11/2023: BUN 43; Creatinine, Ser 2.62; Hemoglobin 9.3; Platelets 85; Potassium 5.4; Sodium 142  Recent Lipid Panel    Component Value Date/Time   CHOL 148 12/17/2020 1149   TRIG 160.0 (H) 12/17/2020  1149   TRIG 62 04/28/2006 0917   HDL 43.00 12/17/2020 1149   CHOLHDL 3 12/17/2020 1149   VLDL 32.0 12/17/2020 1149   LDLCALC 73 12/17/2020 1149   LDLCALC 71 04/12/2020 1705   LDLDIRECT 135.1 03/12/2014 0812    Risk Assessment/Calculations:    CHA2DS2-VASc Score = 6   This indicates a 9.7% annual risk of stroke. The patient's score is based upon: CHF History: 1 HTN History: 1 Diabetes History: 1 Stroke History: 0 Vascular Disease History: 1 Age Score: 2 Gender Score: 0            Physical Exam:    VS:  BP 138/60 (BP Location: Left Arm, Patient Position: Sitting, Cuff Size: Normal)   Pulse 65   Ht 5\' 11"  (1.803 m)   Wt 189 lb (85.7 kg)   SpO2 94%   BMI 26.36 kg/m     Wt Readings from Last 3 Encounters:  05/11/23 189 lb (85.7 kg)  01/11/23 183 lb 3.2 oz (83.1 kg)  12/11/22 184 lb 3.2 oz (83.6 kg)     GEN:  Well nourished, well developed in no acute distress, healthy subclavian pacemaker site HEENT: Normal NECK: No JVD; He has loud bilateral carotid bruits and subclavian bruits LYMPHATICS: No lymphadenopathy CARDIAC: Normal S1 and paradoxical S2 RRR, no murmurs, rubs, gallops RESPIRATORY:  Clear to auscultation without rales, wheezing or rhonchi  ABDOMEN: Soft, non-tender, non-distended MUSCULOSKELETAL:  No edema; No deformity  SKIN: Warm and dry NEUROLOGIC:  Alert and oriented x 3 PSYCHIATRIC:  Normal affect   ASSESSMENT:    1. Chronic diastolic heart failure (HCC)   2. Type 2 diabetes mellitus with stage 4 chronic kidney disease, without long-term current use of insulin (HCC)  3. Stage 3 chronic kidney disease, unspecified whether stage 3a or 3b CKD (HCC)   4. Coronary artery disease involving coronary bypass graft of native heart without angina pectoris   5. Paroxysmal atrial fibrillation (HCC)   6. Acquired thrombophilia (HCC)   7. History of bilateral carotid endarterectomy   8. Bilateral carotid bruits   9. PAD (peripheral artery disease) (HCC)    10. Mixed hyperlipidemia   11. Essential hypertension   12. Other iron deficiency anemia   13. Second degree atrioventricular block   14. Pacemaker    PLAN:    In order of problems listed above:  CHF: Clinically euvolemic, good functional status, normal LVEF.  He does not require daily diuretic therapy. CAD: He does not have angina pectoris.  On statin and beta-blocker.  Not on aspirin due to full anticoagulation. Parox AFib: Very low burden of arrhythmia.  Asymptomatic.  On appropriate anticoagulation. Anticoagulation: Well-tolerated without serious bleeding problems.  Mild anemia with hemoglobin 9.3, but normocytic normochromic and also with thrombocytopenia suggesting a bone marrow production disorder rather than iron deficiency.  Did have a lower ferritin earlier this year however.  No overt bleeding. Carotid stenosis status post bilateral endarterectomy: This has not been reevaluated in years.  Scheduled for carotid ultrasound (this shows bilateral 1-39% stenosis, no severe obstructive lesions).  Both vertebral arteries have antegrade flow.  There is a suggestion that he may have right subclavian artery stenosis. HLP: All lipid parameters are close to target range. HTN: Well-controlled BP on current medications. DM2:  hemoglobin A1c shows excellent control in March (6.3%), but very poorly controlled diabetes in July at 13.4%, but with improvement down to 10.1% currently.  I am not sure for the reason for his wide variation, but he seems to be on the right track towards improvement. CKD4: Creatinine has been very stable in the 2.4-2.9 range over the last year.  GFR is approximately 20-22. Anemia: Likely multifactorial.  Due to renal insufficiency, may be some degree of iron deficiency.  Probably contributing to his complaints of fatigue to some degree.  Appears to be stable. Second-degree AV block: 100% ventricular pacing.  I am not sure whether he is not pacemaker dependent.  He may have  progressed to complete heart block. PPM: Normal device function.  Followed by Dean. Ladona Murphy.           Medication Adjustments/Labs and Tests Ordered: Current medicines are reviewed at length with the patient today.  Concerns regarding medicines are outlined above.  Orders Placed This Encounter  Procedures   Basic metabolic panel   CBC   Hemoglobin A1c   EKG 12-Lead   VAS US CAROTID   No orders of the defined types were placed in this encounter.   Patient Instructions  Medication Instructions:  No changes *If you need a refill on your cardiac medications before your next appointment, please call your pharmacy*   Lab Work: CBC, BMP, AIC If you have labs (blood work) drawn today and your tests are completely normal, you will receive your results only by: MyChart Message (if you have MyChart) OR A paper copy in the mail If you have any lab test that is abnormal or we need to change your treatment, we will call you to review the results.   Testing/Procedures: Your physician has requested that you have a carotid duplex. This test is an ultrasound of the carotid arteries in your neck. It looks at blood flow through these arteries that supply the brain with  blood. Allow one hour for this exam. There are no restrictions or special instructions.    Follow-Up: At [facility], you and your health needs are our priority.  As part of our continuing mission to provide you with exceptional heart care, we have created designated Provider Care Teams.  These Care Teams include your primary Cardiologist (physician) and Advanced Practice Providers (APPs -  Physician Assistants and Nurse Practitioners) who all work together to provide you with the care you need, when you need it.  We recommend signing up for the patient portal called "MyChart".  Sign up information is provided on this After Visit Summary.  MyChart is used to connect with patients for Virtual Visits (Telemedicine).   Patients are able to view lab/test results, encounter notes, upcoming appointments, etc.  Non-urgent messages can be sent to your provider as well.   To learn more about what you can do with MyChart, go to [email or web address].    Your next appointment:   6 month(s)  Provider:   Dr Royann Shivers         Signed, Thurmon Fair, MD  05/14/2023 8:30 PM    [facility]

## 2023-05-11 NOTE — Patient Instructions (Signed)
Medication Instructions:  No changes *If you need a refill on your cardiac medications before your next appointment, please call your pharmacy*   Lab Work: CBC, BMP, AIC If you have labs (blood work) drawn today and your tests are completely normal, you will receive your results only by: MyChart Message (if you have MyChart) OR A paper copy in the mail If you have any lab test that is abnormal or we need to change your treatment, we will call you to review the results.   Testing/Procedures: Your physician has requested that you have a carotid duplex. This test is an ultrasound of the carotid arteries in your neck. It looks at blood flow through these arteries that supply the brain with blood. Allow one hour for this exam. There are no restrictions or special instructions.    Follow-Up: At [facility], you and your health needs are our priority.  As part of our continuing mission to provide you with exceptional heart care, we have created designated Provider Care Teams.  These Care Teams include your primary Cardiologist (physician) and Advanced Practice Providers (APPs -  Physician Assistants and Nurse Practitioners) who all work together to provide you with the care you need, when you need it.  We recommend signing up for the patient portal called "MyChart".  Sign up information is provided on this After Visit Summary.  MyChart is used to connect with patients for Virtual Visits (Telemedicine).  Patients are able to view lab/test results, encounter notes, upcoming appointments, etc.  Non-urgent messages can be sent to your provider as well.   To learn more about what you can do with MyChart, go to [email or web address].    Your next appointment:   6 month(s)  Provider:   Dr Royann Shivers

## 2023-05-12 LAB — CBC
Hematocrit: 28.8 % — ABNORMAL LOW (ref 37.5–51.0)
Hemoglobin: 9.3 g/dL — ABNORMAL LOW (ref 13.0–17.7)
MCH: 30.1 pg (ref 26.6–33.0)
MCHC: 32.3 g/dL (ref 31.5–35.7)
MCV: 93 fL (ref 79–97)
Platelets: 85 10*3/uL — CL (ref 150–450)
RBC: 3.09 x10E6/uL — ABNORMAL LOW (ref 4.14–5.80)
RDW: 13.7 % (ref 11.6–15.4)
WBC: 5.6 10*3/uL (ref 3.4–10.8)

## 2023-05-12 LAB — HEMOGLOBIN A1C
Est. average glucose Bld gHb Est-mCnc: 243 mg/dL
Hgb A1c MFr Bld: 10.1 % — ABNORMAL HIGH (ref 4.8–5.6)

## 2023-05-12 LAB — BASIC METABOLIC PANEL
BUN/Creatinine Ratio: 16 (ref 10–24)
BUN: 43 mg/dL — ABNORMAL HIGH (ref 10–36)
CO2: 19 mmol/L — ABNORMAL LOW (ref 20–29)
Calcium: 8.8 mg/dL (ref 8.6–10.2)
Chloride: 112 mmol/L — ABNORMAL HIGH (ref 96–106)
Creatinine, Ser: 2.62 mg/dL — ABNORMAL HIGH (ref 0.76–1.27)
Glucose: 271 mg/dL — ABNORMAL HIGH (ref 70–99)
Potassium: 5.4 mmol/L — ABNORMAL HIGH (ref 3.5–5.2)
Sodium: 142 mmol/L (ref 134–144)
eGFR: 22 mL/min/{1.73_m2} — ABNORMAL LOW (ref >59)

## 2023-05-13 ENCOUNTER — Ambulatory Visit (HOSPITAL_COMMUNITY)
Admission: RE | Admit: 2023-05-13 | Discharge: 2023-05-13 | Disposition: A | Discharge status: Home / Self Care | Payer: Medicare Other | Source: Ambulatory Visit | Attending: Internal Medicine | Admitting: Internal Medicine

## 2023-05-13 DIAGNOSIS — R0989 Other specified symptoms and signs involving the circulatory and respiratory systems: Secondary | ICD-10-CM | POA: Diagnosis present

## 2023-05-28 ENCOUNTER — Encounter: Payer: Self-pay | Admitting: Family Medicine

## 2023-05-28 ENCOUNTER — Ambulatory Visit: Payer: Medicare Other | Admitting: Family Medicine

## 2023-05-28 VITALS — BP 132/58 | HR 65 | Temp 97.5°F | Ht 71.0 in | Wt 190.6 lb

## 2023-05-28 DIAGNOSIS — R0602 Shortness of breath: Secondary | ICD-10-CM

## 2023-05-28 DIAGNOSIS — E1122 Type 2 diabetes mellitus with diabetic chronic kidney disease: Secondary | ICD-10-CM

## 2023-05-28 DIAGNOSIS — Z794 Long term (current) use of insulin: Secondary | ICD-10-CM | POA: Diagnosis not present

## 2023-05-28 DIAGNOSIS — D649 Anemia, unspecified: Secondary | ICD-10-CM

## 2023-05-28 LAB — COMPREHENSIVE METABOLIC PANEL
ALT: 24 U/L (ref 0–53)
AST: 20 U/L (ref 0–37)
Albumin: 3.7 g/dL (ref 3.5–5.2)
Alkaline Phosphatase: 113 U/L (ref 39–117)
BUN: 52 mg/dL — ABNORMAL HIGH (ref 6–23)
CO2: 20 meq/L (ref 19–32)
Calcium: 8.9 mg/dL (ref 8.4–10.5)
Chloride: 117 meq/L — ABNORMAL HIGH (ref 96–112)
Creatinine, Ser: 2.73 mg/dL — ABNORMAL HIGH (ref 0.40–1.50)
GFR: 19.35 mL/min — ABNORMAL LOW (ref >60.00)
Glucose, Bld: 268 mg/dL — ABNORMAL HIGH (ref 70–99)
Potassium: 5.9 meq/L — ABNORMAL HIGH (ref 3.5–5.1)
Sodium: 143 meq/L (ref 135–145)
Total Bilirubin: 0.4 mg/dL (ref 0.2–1.2)
Total Protein: 5.6 g/dL — ABNORMAL LOW (ref 6.0–8.3)

## 2023-05-28 LAB — IRON: Iron: 41 ug/dL — ABNORMAL LOW (ref 42–165)

## 2023-05-28 LAB — CBC WITH DIFFERENTIAL/PLATELET
Basophils Absolute: 0 10*3/uL (ref 0.0–0.1)
Basophils Relative: 0.7 % (ref 0.0–3.0)
Eosinophils Absolute: 0.2 10*3/uL (ref 0.0–0.7)
Eosinophils Relative: 2.6 % (ref 0.0–5.0)
HCT: 25.1 % — ABNORMAL LOW (ref 39.0–52.0)
Hemoglobin: 8.2 g/dL — ABNORMAL LOW (ref 13.0–17.0)
Lymphocytes Relative: 15 % (ref 12.0–46.0)
Lymphs Abs: 0.9 10*3/uL (ref 0.7–4.0)
MCHC: 32.7 g/dL (ref 30.0–36.0)
MCV: 92.3 fL (ref 78.0–100.0)
Monocytes Absolute: 0.3 10*3/uL (ref 0.1–1.0)
Monocytes Relative: 5.1 % (ref 3.0–12.0)
Neutro Abs: 4.6 10*3/uL (ref 1.4–7.7)
Neutrophils Relative %: 76.6 % (ref 43.0–77.0)
Platelets: 85 10*3/uL — ABNORMAL LOW (ref 150.0–400.0)
RBC: 2.72 Mil/uL — ABNORMAL LOW (ref 4.22–5.81)
RDW: 15.9 % — ABNORMAL HIGH (ref 11.5–15.5)
WBC: 6 10*3/uL (ref 4.0–10.5)

## 2023-05-28 LAB — BRAIN NATRIURETIC PEPTIDE: Pro B Natriuretic peptide (BNP): 348 pg/mL — ABNORMAL HIGH (ref 0.0–100.0)

## 2023-05-28 LAB — FERRITIN: Ferritin: 10.7 ng/mL — ABNORMAL LOW (ref 22.0–322.0)

## 2023-05-28 LAB — VITAMIN B12: Vitamin B-12: 213 pg/mL (ref 211–911)

## 2023-05-28 MED ORDER — LANTUS SOLOSTAR 100 UNIT/ML ~~LOC~~ SOPN: 46.0000 [IU] | PEN_INJECTOR | Freq: Every day | SUBCUTANEOUS | Status: DC

## 2023-05-28 NOTE — Patient Instructions (Signed)
 Don't change your meds yet.  Go to the lab on the way out.   If you have mychart we'll likely use that to update you.    You may need to stop your blood thinner but I wouldn't change yet.  Take care.  Glad to see you.

## 2023-05-28 NOTE — Progress Notes (Signed)
 Diabetes:  Using medications without difficulties: yes Hypoglycemic episodes:no Hyperglycemic episodes: not recently.   Sugar was 200 this AM.  He forgot PM dose last night so he took a dose of insulin  this AM.  Discussed.  He is off januvia  glipizide  amitiza  and omeprazole  in the meantime.  Med list updated.  BP has generally been 130-160/60-70s.  Sugar has generally been between 100 and 200 recently.  Fatigue with SOBOE, since his back procedure.  His back pain is better but not resolved.  Not SOB supine.  Sleeping on 1 pillow on a flat bed.    He had darker stools chronically.    PMH and SH reviewed  Meds, vitals, and allergies reviewed.   ROS: Per HPI unless specifically indicated in ROS section   GEN: nad, alert and oriented HEENT: mucous membranes moist NECK: supple w/o LA CV: RRR PULM: ctab, no inc wob ABD: soft, +bs EXT: trace BLE edema SKIN: well perfused.   31 minutes were devoted to patient care in this encounter (this includes time spent reviewing the patient's file/history, interviewing and examining the patient, counseling/reviewing plan with patient).

## 2023-05-30 ENCOUNTER — Other Ambulatory Visit: Payer: Self-pay | Admitting: Family Medicine

## 2023-05-30 DIAGNOSIS — D649 Anemia, unspecified: Secondary | ICD-10-CM

## 2023-05-30 MED ORDER — IRON (FERROUS SULFATE) 325 (65 FE) MG PO TABS: 325.0000 mg | ORAL_TABLET | Freq: Every day | ORAL | Status: DC

## 2023-05-30 MED ORDER — VITAMIN B-12 1000 MCG PO TABS: 1000.0000 ug | ORAL_TABLET | Freq: Every day | ORAL | Status: AC

## 2023-05-30 NOTE — Assessment & Plan Note (Signed)
 His sugar is not yet at goal but it is improving and not emergently high recently.  His A1c has improved some recently, though it is not at goal.  Continue insulin as is with routine cautions discussed with patient.

## 2023-05-30 NOTE — Assessment & Plan Note (Signed)
 History of, with shortness of breath on exertion that is chronic.  Not short of breath supine.  He has had dark stools chronically.  See notes on labs.  Currently anticoagulated but if he has progressive anemia we will likely have to stop that.  Discussed.  At this point still okay for outpatient follow-up.

## 2023-06-02 ENCOUNTER — Other Ambulatory Visit: Payer: Self-pay | Admitting: Family Medicine

## 2023-06-02 ENCOUNTER — Telehealth: Payer: Self-pay | Admitting: Family Medicine

## 2023-06-02 ENCOUNTER — Other Ambulatory Visit (INDEPENDENT_AMBULATORY_CARE_PROVIDER_SITE_OTHER): Payer: Medicare Other

## 2023-06-02 DIAGNOSIS — D649 Anemia, unspecified: Secondary | ICD-10-CM

## 2023-06-02 LAB — CBC WITH DIFFERENTIAL/PLATELET
Basophils Absolute: 0 10*3/uL (ref 0.0–0.1)
Basophils Relative: 0.7 % (ref 0.0–3.0)
Eosinophils Absolute: 0.2 10*3/uL (ref 0.0–0.7)
Eosinophils Relative: 2.7 % (ref 0.0–5.0)
HCT: 25.9 % — ABNORMAL LOW (ref 39.0–52.0)
Hemoglobin: 8.2 g/dL — ABNORMAL LOW (ref 13.0–17.0)
Lymphocytes Relative: 25.3 % (ref 12.0–46.0)
Lymphs Abs: 1.7 10*3/uL (ref 0.7–4.0)
MCHC: 31.6 g/dL (ref 30.0–36.0)
MCV: 93.9 fL (ref 78.0–100.0)
Monocytes Absolute: 0.4 10*3/uL (ref 0.1–1.0)
Monocytes Relative: 6.2 % (ref 3.0–12.0)
Neutro Abs: 4.5 10*3/uL (ref 1.4–7.7)
Neutrophils Relative %: 65.1 % (ref 43.0–77.0)
Platelets: 92 10*3/uL — ABNORMAL LOW (ref 150.0–400.0)
RBC: 2.76 Mil/uL — ABNORMAL LOW (ref 4.22–5.81)
RDW: 16.8 % — ABNORMAL HIGH (ref 11.5–15.5)
WBC: 6.9 10*3/uL (ref 4.0–10.5)

## 2023-06-02 LAB — BASIC METABOLIC PANEL
BUN: 52 mg/dL — ABNORMAL HIGH (ref 6–23)
CO2: 20 meq/L (ref 19–32)
Calcium: 9 mg/dL (ref 8.4–10.5)
Chloride: 118 meq/L — ABNORMAL HIGH (ref 96–112)
Creatinine, Ser: 2.9 mg/dL — ABNORMAL HIGH (ref 0.40–1.50)
GFR: 18 mL/min — ABNORMAL LOW (ref >60.00)
Glucose, Bld: 243 mg/dL — ABNORMAL HIGH (ref 70–99)
Potassium: 5.9 meq/L — ABNORMAL HIGH (ref 3.5–5.1)
Sodium: 146 meq/L — ABNORMAL HIGH (ref 135–145)

## 2023-06-02 NOTE — Telephone Encounter (Signed)
 Noted.  See notes on labs.  Thanks.

## 2023-06-02 NOTE — Telephone Encounter (Signed)
 Awaiting labs.  Please call pt re: back pain and update me.  Thanks.

## 2023-06-02 NOTE — Telephone Encounter (Signed)
 Copied from CRM [id number]. Topic: Clinical - Medical Advice >> Jun 02, 2023  4:29 PM Dean Murphy wrote: Reason for CRM: patient returned call to AV per dr cleatus  to give update on back pain.Patient stated that he's  still having back pain.He said that as long as he's sitting down he's okay,but as soon as he stands up his back starts to hurt.

## 2023-06-02 NOTE — Telephone Encounter (Signed)
 Left voicemail for patient to return call to office.

## 2023-06-03 ENCOUNTER — Telehealth: Payer: Self-pay | Admitting: Family Medicine

## 2023-06-03 NOTE — Telephone Encounter (Signed)
 Returned call to son who is on the [facility] and when over what the patient has been advised as well as what Dr. Cleatus suggested. But I did let son know that if the patient is in unbearable pain that Dr. Cleatus would like for him to go to the ED.  But once he comes in for lab work next then Dr. Cleatus can address the back as well. Patient son understood.

## 2023-06-03 NOTE — Telephone Encounter (Signed)
 Copied from CRM [id number]. Topic: General - Other >> Jun 03, 2023  8:41 AM Donita Brooks wrote: Reason for CRM: pt son Jerris Fleer would like for Dr. Para March nurse to give him a call at [phone] regarding his dad back and possible admitted him to hospital

## 2023-06-10 ENCOUNTER — Other Ambulatory Visit (INDEPENDENT_AMBULATORY_CARE_PROVIDER_SITE_OTHER): Payer: Medicare Other

## 2023-06-10 DIAGNOSIS — D649 Anemia, unspecified: Secondary | ICD-10-CM | POA: Diagnosis not present

## 2023-06-10 LAB — CBC WITH DIFFERENTIAL/PLATELET
Basophils Absolute: 0.1 10*3/uL (ref 0.0–0.1)
Basophils Relative: 0.9 % (ref 0.0–3.0)
Eosinophils Absolute: 0.2 10*3/uL (ref 0.0–0.7)
Eosinophils Relative: 2.2 % (ref 0.0–5.0)
HCT: 27.6 % — ABNORMAL LOW (ref 39.0–52.0)
Hemoglobin: 8.8 g/dL — ABNORMAL LOW (ref 13.0–17.0)
Lymphocytes Relative: 25.5 % (ref 12.0–46.0)
Lymphs Abs: 1.8 10*3/uL (ref 0.7–4.0)
MCHC: 31.8 g/dL (ref 30.0–36.0)
MCV: 94.6 fL (ref 78.0–100.0)
Monocytes Absolute: 0.4 10*3/uL (ref 0.1–1.0)
Monocytes Relative: 5.4 % (ref 3.0–12.0)
Neutro Abs: 4.6 10*3/uL (ref 1.4–7.7)
Neutrophils Relative %: 66 % (ref 43.0–77.0)
Platelets: 102 10*3/uL — ABNORMAL LOW (ref 150.0–400.0)
RBC: 2.91 Mil/uL — ABNORMAL LOW (ref 4.22–5.81)
RDW: 18.6 % — ABNORMAL HIGH (ref 11.5–15.5)
WBC: 7 10*3/uL (ref 4.0–10.5)

## 2023-06-10 LAB — BASIC METABOLIC PANEL
BUN: 46 mg/dL — ABNORMAL HIGH (ref 6–23)
CO2: 20 meq/L (ref 19–32)
Calcium: 8.8 mg/dL (ref 8.4–10.5)
Chloride: 116 meq/L — ABNORMAL HIGH (ref 96–112)
Creatinine, Ser: 3.04 mg/dL — ABNORMAL HIGH (ref 0.40–1.50)
GFR: 17 mL/min — ABNORMAL LOW (ref >60.00)
Glucose, Bld: 216 mg/dL — ABNORMAL HIGH (ref 70–99)
Potassium: 5.2 meq/L — ABNORMAL HIGH (ref 3.5–5.1)
Sodium: 145 meq/L (ref 135–145)

## 2023-06-13 ENCOUNTER — Other Ambulatory Visit: Payer: Self-pay | Admitting: Family Medicine

## 2023-06-13 DIAGNOSIS — D649 Anemia, unspecified: Secondary | ICD-10-CM

## 2023-06-14 ENCOUNTER — Telehealth: Payer: Self-pay | Admitting: Family Medicine

## 2023-06-14 NOTE — Telephone Encounter (Signed)
I spoke with Jomarie Longs (DPR signed) Jomarie Longs said that pt cannot walk without walker now; pt has tingling in both arms and legs and has severe back pain when trying to walk.so Jomarie Longs said pt is worse. Jomarie Longs said pt thinks he cannot go to Ed unless Dr Para March says to go. Jomarie Longs who is in [facility] asked me to call pt and I did and pt said he does need walker when walking and pt does have tingling in arms and legs and sitting pt does not have pain but when up walking has severe back pain. Advised pt if worse Dr Para March does advise for pt to go to ED. Pt said he does not want to go to ED and if pt condition worsens he will call [facility] to call ems to come pick him up and take to ED.I did call Jomarie Longs and advise what pt said and oseph voiced understanding and appreciative. and  Sending note to Dr Para March.

## 2023-06-14 NOTE — Telephone Encounter (Signed)
See notes on labs and mychart message.  If he is clearly feeling worse, then I recommend ER eval.  Thanks.

## 2023-06-14 NOTE — Telephone Encounter (Signed)
Noted. Thanks.

## 2023-06-14 NOTE — Telephone Encounter (Signed)
Called and advised patient of Dr. Armanda Heritage message. Scheduled patient OV with Para March 06/17/23.

## 2023-06-14 NOTE — Telephone Encounter (Signed)
If not seen in the ER in the near future, then I think it needs OV here when possible.

## 2023-06-14 NOTE — Telephone Encounter (Signed)
Called patient and reviewed all information. Patient verbalized understanding. Copy of labs routed to renal clinic. Patient states he is not feeling worse, he states he is about the same as he was in office. He states he is in no pain when he is sitting, however when he stands up or walks around he is in severe pain. Advised patient per Dr. Para March if he starts feeling worse he needs to be seen at ED for evaluation. Patient verbalized understanding, but stated he did not want to go to ED.

## 2023-06-17 ENCOUNTER — Ambulatory Visit: Payer: Medicare Other | Admitting: Family Medicine

## 2023-06-17 ENCOUNTER — Encounter: Payer: Self-pay | Admitting: Family Medicine

## 2023-06-17 VITALS — BP 138/64 | HR 69 | Temp 97.8°F | Ht 71.0 in | Wt 193.0 lb

## 2023-06-17 DIAGNOSIS — R202 Paresthesia of skin: Secondary | ICD-10-CM

## 2023-06-17 DIAGNOSIS — D649 Anemia, unspecified: Secondary | ICD-10-CM

## 2023-06-17 DIAGNOSIS — N183 Chronic kidney disease, stage 3 unspecified: Secondary | ICD-10-CM

## 2023-06-17 DIAGNOSIS — M545 Low back pain, unspecified: Secondary | ICD-10-CM | POA: Diagnosis not present

## 2023-06-17 LAB — BASIC METABOLIC PANEL
BUN: 39 mg/dL — ABNORMAL HIGH (ref 6–23)
CO2: 23 meq/L (ref 19–32)
Calcium: 9.3 mg/dL (ref 8.4–10.5)
Chloride: 116 meq/L — ABNORMAL HIGH (ref 96–112)
Creatinine, Ser: 2.59 mg/dL — ABNORMAL HIGH (ref 0.40–1.50)
GFR: 20.61 mL/min — ABNORMAL LOW (ref >60.00)
Glucose, Bld: 104 mg/dL — ABNORMAL HIGH (ref 70–99)
Potassium: 5.9 meq/L — ABNORMAL HIGH (ref 3.5–5.1)
Sodium: 145 meq/L (ref 135–145)

## 2023-06-17 LAB — CBC WITH DIFFERENTIAL/PLATELET
Basophils Absolute: 0 10*3/uL (ref 0.0–0.1)
Basophils Relative: 0.6 % (ref 0.0–3.0)
Eosinophils Absolute: 0.2 10*3/uL (ref 0.0–0.7)
Eosinophils Relative: 3.7 % (ref 0.0–5.0)
HCT: 28.5 % — ABNORMAL LOW (ref 39.0–52.0)
Hemoglobin: 9.1 g/dL — ABNORMAL LOW (ref 13.0–17.0)
Lymphocytes Relative: 16.6 % (ref 12.0–46.0)
Lymphs Abs: 0.9 10*3/uL (ref 0.7–4.0)
MCHC: 31.9 g/dL (ref 30.0–36.0)
MCV: 93.2 fL (ref 78.0–100.0)
Monocytes Absolute: 0.4 10*3/uL (ref 0.1–1.0)
Monocytes Relative: 7.7 % (ref 3.0–12.0)
Neutro Abs: 3.8 10*3/uL (ref 1.4–7.7)
Neutrophils Relative %: 71.4 % (ref 43.0–77.0)
Platelets: 95 10*3/uL — ABNORMAL LOW (ref 150.0–400.0)
RBC: 3.06 Mil/uL — ABNORMAL LOW (ref 4.22–5.81)
RDW: 17 % — ABNORMAL HIGH (ref 11.5–15.5)
WBC: 5.4 10*3/uL (ref 4.0–10.5)

## 2023-06-17 MED ORDER — CYANOCOBALAMIN 1000 MCG/ML IJ SOLN
1000.0000 ug | Freq: Once | INTRAMUSCULAR | Status: AC
  Administered 2023-06-17: 1000 ug via INTRAMUSCULAR

## 2023-06-17 MED ORDER — IRON (FERROUS SULFATE) 325 (65 FE) MG PO TABS: 325.0000 mg | ORAL_TABLET | ORAL | Status: DC

## 2023-06-17 NOTE — Progress Notes (Signed)
CKD noted. Allergy list updated.  He was able to tolerate lokelma.  Discussed.  See notes on labs.   He is off iron in the meantime.  He is off eliquis.  He prev had black stools but that resolved.   He had hand paresthesias, tingling.  Similar in the legs.  Constant. B12 low normal at 213 on recent check.  Sx noted over the last few months.    No CP but he feels weak with exertion.  Anemia noted.    He has lower back pain with standing, clearly better sitting.  Using a walker helps his back.  Using a cane at the OV today.  His back pain is better compared to a few weeks ago.    Meds, vitals, and allergies reviewed.   ROS: Per HPI unless specifically indicated in ROS section   Nad Ncat Neck supple no LA Rrr Ctab Abd soft, not ttp Back not ttp in midline.   L>R BLE edema.

## 2023-06-17 NOTE — Progress Notes (Signed)
Per orders of Dr. Crawford Givens, injection of B-12 given by Leonor Liv in left deltoid. Patient tolerated injection well.

## 2023-06-17 NOTE — Patient Instructions (Addendum)
Go to the lab on the way out.   If you have mychart we'll likely use that to update you.    B12 shot today.  Continue B12 by mouth.  Try taking iron 2-3 times per week.   Let me know if the tingling gets better in the next week or so.   Use the walker and take tylenol as needed for pain.

## 2023-06-18 ENCOUNTER — Other Ambulatory Visit: Payer: Self-pay | Admitting: Family Medicine

## 2023-06-18 DIAGNOSIS — D649 Anemia, unspecified: Secondary | ICD-10-CM

## 2023-06-18 DIAGNOSIS — R202 Paresthesia of skin: Secondary | ICD-10-CM | POA: Insufficient documentation

## 2023-06-18 MED ORDER — VITAMIN C 100 MG PO TABS: ORAL_TABLET | ORAL | Status: DC

## 2023-06-18 MED ORDER — LOKELMA 10 G PO PACK: 10.0000 g | PACK | Freq: Every day | ORAL | 0 refills | Status: DC

## 2023-06-18 MED ORDER — VITAMIN C 100 MG PO TABS: 100.0000 mg | ORAL_TABLET | Freq: Every day | ORAL | Status: DC

## 2023-06-18 NOTE — Assessment & Plan Note (Signed)
B12 low normal at 213 on recent check.  Sx noted over the last few months.  B12 dose IM today.  Continue B12 by mouth.  I asked him to update me about sx in the next week or so.

## 2023-06-18 NOTE — Assessment & Plan Note (Signed)
See notes on labs.  Likely contributes to lack tolerance for exertion.  He can try taking iron 2-3 times per week with vit C.

## 2023-06-18 NOTE — Assessment & Plan Note (Signed)
See notes on labs.  He can tolerate lokema if needed.

## 2023-06-18 NOTE — Assessment & Plan Note (Signed)
Concern for spinal stenosis (anatomy d/w pt) but better than a few weeks ago. Use walker and take tylenol as needed for pain.  No new weakness and okay for outpatient f/u.

## 2023-06-21 NOTE — Progress Notes (Signed)
Spoke with patient and advised of his lab results. I also let him know that the rx for lokelma has been sent to pharmacy. Patient has been scheduled for lab appt on 2/3/2

## 2023-06-25 ENCOUNTER — Telehealth: Payer: Self-pay

## 2023-06-25 NOTE — Telephone Encounter (Deleted)
Copied from CRM [id number]. Topic: Clinical - Medication Question >> Jun 25, 2023  1:35 PM Sonny Dandy B wrote: Reason for CRM: pt returning a call from [facility]. Pt is requesting a call back please call pt at [phone]

## 2023-06-25 NOTE — Telephone Encounter (Signed)
Copied from CRM [id number]. Topic: Clinical - Medication Question >> Jun 25, 2023  1:35 PM Sonny Dandy B wrote: Reason for CRM: pt returning a call from [facility]. Pt is requesting a call back please call pt at [phone]

## 2023-06-27 ENCOUNTER — Other Ambulatory Visit: Payer: Self-pay | Admitting: Family Medicine

## 2023-06-28 ENCOUNTER — Other Ambulatory Visit: Payer: Self-pay

## 2023-06-28 ENCOUNTER — Other Ambulatory Visit (INDEPENDENT_AMBULATORY_CARE_PROVIDER_SITE_OTHER): Payer: Medicare Other

## 2023-06-28 DIAGNOSIS — I10 Essential (primary) hypertension: Secondary | ICD-10-CM

## 2023-06-28 DIAGNOSIS — D649 Anemia, unspecified: Secondary | ICD-10-CM | POA: Diagnosis not present

## 2023-06-28 DIAGNOSIS — E785 Hyperlipidemia, unspecified: Secondary | ICD-10-CM

## 2023-06-28 LAB — CBC WITH DIFFERENTIAL/PLATELET
Basophils Absolute: 0 10*3/uL (ref 0.0–0.1)
Basophils Relative: 0.8 % (ref 0.0–3.0)
Eosinophils Absolute: 0.1 10*3/uL (ref 0.0–0.7)
Eosinophils Relative: 2.6 % (ref 0.0–5.0)
HCT: 28.6 % — ABNORMAL LOW (ref 39.0–52.0)
Hemoglobin: 9.3 g/dL — ABNORMAL LOW (ref 13.0–17.0)
Lymphocytes Relative: 23.5 % (ref 12.0–46.0)
Lymphs Abs: 0.8 10*3/uL (ref 0.7–4.0)
MCHC: 32.5 g/dL (ref 30.0–36.0)
MCV: 92.2 fL (ref 78.0–100.0)
Monocytes Absolute: 0.2 10*3/uL (ref 0.1–1.0)
Monocytes Relative: 5.7 % (ref 3.0–12.0)
Neutro Abs: 2.4 10*3/uL (ref 1.4–7.7)
Neutrophils Relative %: 67.4 % (ref 43.0–77.0)
Platelets: 79 10*3/uL — ABNORMAL LOW (ref 150.0–400.0)
RBC: 3.11 Mil/uL — ABNORMAL LOW (ref 4.22–5.81)
RDW: 17.7 % — ABNORMAL HIGH (ref 11.5–15.5)
WBC: 3.5 10*3/uL — ABNORMAL LOW (ref 4.0–10.5)

## 2023-06-28 LAB — BASIC METABOLIC PANEL
BUN: 30 mg/dL — ABNORMAL HIGH (ref 6–23)
CO2: 23 meq/L (ref 19–32)
Calcium: 8.6 mg/dL (ref 8.4–10.5)
Chloride: 113 meq/L — ABNORMAL HIGH (ref 96–112)
Creatinine, Ser: 2.36 mg/dL — ABNORMAL HIGH (ref 0.40–1.50)
GFR: 23.03 mL/min — ABNORMAL LOW (ref >60.00)
Glucose, Bld: 212 mg/dL — ABNORMAL HIGH (ref 70–99)
Potassium: 4.9 meq/L (ref 3.5–5.1)
Sodium: 145 meq/L (ref 135–145)

## 2023-06-28 MED ORDER — METOPROLOL SUCCINATE ER 25 MG PO TB24: 25.0000 mg | ORAL_TABLET | Freq: Every day | ORAL | 3 refills | Status: DC

## 2023-06-28 MED ORDER — ROSUVASTATIN CALCIUM 20 MG PO TABS: 20.0000 mg | ORAL_TABLET | Freq: Every day | ORAL | 3 refills | Status: DC

## 2023-06-28 NOTE — Telephone Encounter (Signed)
 Left voicemail for patient to return call to office.

## 2023-06-29 NOTE — Telephone Encounter (Signed)
Spoke with patient and did confirm that he is not taking either medication

## 2023-06-29 NOTE — Telephone Encounter (Signed)
 Noted. Thanks.

## 2023-06-29 NOTE — Telephone Encounter (Signed)
Insulin sent.  Please verify januvia and omeprazole use.  I thought he was off both. Please let me know.  Thanks.

## 2023-06-29 NOTE — Telephone Encounter (Signed)
Closing encounter. I spoke with patient to advise of his labs and to scheduled lab appointment. Now just waiting on provider to review lab results from 06/28/23

## 2023-06-30 ENCOUNTER — Ambulatory Visit: Payer: Medicare Other | Admitting: Surgery

## 2023-06-30 ENCOUNTER — Telehealth: Payer: Self-pay

## 2023-06-30 ENCOUNTER — Encounter: Payer: Self-pay | Admitting: Surgery

## 2023-06-30 VITALS — BP 112/59 | HR 114 | Temp 97.8°F | Ht 71.0 in | Wt 190.8 lb

## 2023-06-30 DIAGNOSIS — K4090 Unilateral inguinal hernia, without obstruction or gangrene, not specified as recurrent: Secondary | ICD-10-CM

## 2023-06-30 NOTE — Patient Instructions (Signed)
 You have chose to have your hernia repaired. This will be done by Dr. Everlene Farrier at [facility].  Please see your (blue) Pre-care information that you have been given today. Our surgery scheduler will call you to verify surgery date and to go over information.   You will need to arrange to be out of work for approximately 1-2 weeks and then you Burnstein return with a lifting restriction for 4 more weeks. If you have FMLA or Disability paperwork that needs to be filled out, please have your company fax your paperwork to [phone] or you Lowman drop this by either office. This paperwork will be filled out within 3 days after your surgery has been completed.  You Courville have a bruise in your groin and also swelling and brusing in your testicle area. You Bensen use ice 4-5 times daily for 15-20 minutes each time. Make sure that you place a barrier between you and the ice pack. To decrease the swelling, you Foglio roll up a bath towel and place it vertically in between your thighs with your testicles resting on the towel. You will want to keep this area elevated as much as possible for several days following surgery.    Inguinal Hernia, Adult Muscles help keep everything in the body in its proper place. But if a weak spot in the muscles develops, something can poke through. That is called a hernia. When this happens in the lower part of the belly (abdomen), it is called an inguinal hernia. (It takes its name from a part of the body in this region called the inguinal canal.) A weak spot in the wall of muscles lets some fat or part of the small intestine bulge through. An inguinal hernia can develop at any age. Men get them more often than women. CAUSES  In adults, an inguinal hernia develops over time. It can be triggered by: Suddenly straining the muscles of the lower abdomen. Lifting heavy objects. Straining to have a bowel movement. Difficult bowel movements (constipation) can lead to this. Constant coughing. This  Jun be caused by smoking or lung disease. Being overweight. Being pregnant. Working at a job that requires long periods of standing or heavy lifting. Having had an inguinal hernia before. One type can be an emergency situation. It is called a strangulated inguinal hernia. It develops if part of the small intestine slips through the weak spot and cannot get back into the abdomen. The blood supply can be cut off. If that happens, part of the intestine Cullen die. This situation requires emergency surgery. SYMPTOMS  Often, a small inguinal hernia has no symptoms. It is found when a healthcare provider does a physical exam. Larger hernias usually have symptoms.  In adults, symptoms Fabela include: A lump in the groin. This is easier to see when the person is standing. It might disappear when lying down. In men, a lump in the scrotum. Pain or burning in the groin. This occurs especially when lifting, straining or coughing. A dull ache or feeling of pressure in the groin. Signs of a strangulated hernia can include: A bulge in the groin that becomes very painful and tender to the touch. A bulge that turns red or purple. Fever, nausea and vomiting. Inability to have a bowel movement or to pass gas. DIAGNOSIS  To decide if you have an inguinal hernia, a healthcare provider will probably do a physical examination. This will include asking questions about any symptoms you have noticed. The healthcare provider might  feel the groin area and ask you to cough. If an inguinal hernia is felt, the healthcare provider Yamamoto try to slide it back into the abdomen. Usually no other tests are needed. TREATMENT  Treatments can vary. The size of the hernia makes a difference. Options include: Watchful waiting. This is often suggested if the hernia is small and you have had no symptoms. No medical procedure will be done unless symptoms develop. You will need to watch closely for symptoms. If any occur, contact your  healthcare provider right away. Surgery. This is used if the hernia is larger or you have symptoms. Open surgery. This is usually an outpatient procedure (you will not stay overnight in a hospital). An cut (incision) is made through the skin in the groin. The hernia is put back inside the abdomen. The weak area in the muscles is then repaired by herniorrhaphy or hernioplasty. Herniorrhaphy: in this type of surgery, the weak muscles are sewn back together. Hernioplasty: a patch or mesh is used to close the weak area in the abdominal wall. Laparoscopy. In this procedure, a surgeon makes small incisions. A thin tube with a tiny video camera (called a laparoscope) is put into the abdomen. The surgeon repairs the hernia with mesh by looking with the video camera and using two long instruments. HOME CARE INSTRUCTIONS  After surgery to repair an inguinal hernia: You will need to take pain medicine prescribed by your healthcare provider. Follow all directions carefully. You will need to take care of the wound from the incision. Your activity will be restricted for awhile. This will probably include no heavy lifting for several weeks. You also should not do anything too active for a few weeks. When you can return to work will depend on the type of job that you have. During "watchful waiting" periods, you should: Maintain a healthy weight. Eat a diet high in fiber (fruits, vegetables and whole grains). Drink plenty of fluids to avoid constipation. This means drinking enough water and other liquids to keep your urine clear or pale yellow. Do not lift heavy objects. Do not stand for long periods of time. Quit smoking. This should keep you from developing a frequent cough. SEEK MEDICAL CARE IF:  A bulge develops in your groin area. You feel pain, a burning sensation or pressure in the groin. This might be worse if you are lifting or straining. You develop a fever of more than 100.5 F (38.1 C). SEEK  IMMEDIATE MEDICAL CARE IF:  Pain in the groin increases suddenly. A bulge in the groin gets bigger suddenly and does not go down. For men, there is sudden pain in the scrotum. Or, the size of the scrotum increases. A bulge in the groin area becomes red or purple and is painful to touch. You have nausea or vomiting that does not go away. You feel your heart beating much faster than normal. You cannot have a bowel movement or pass gas. You develop a fever of more than 102.0 F (38.9 C).   This information is not intended to replace advice given to you by your health care provider. Make sure you discuss any questions you have with your health care provider.   Document Released: 09/27/2008 Document Revised: 08/03/2011 Document Reviewed: 11/12/2014 Elsevier Interactive Patient Education [employer]

## 2023-06-30 NOTE — H&P (View-Only) (Signed)
 Outpatient Surgical Follow Up  06/30/2023  Dean Murphy is an 89 y.o. male.   Chief Complaint  Patient presents with   Follow-up    Right inguinal pain    HPI: Dean Murphy is a 89 y.o. male seen for a right inguinal hernia.  He reports that he has this hernia for few years now.  He expresses that he is able to reduce the hernia.  Does report that over the last year or so his symptoms have worsened.  Please note that I saw him close to a year and a half ago and at that time we decided a lot of intervention. . Taken off anticoagulation.  He does have chronic thrombocytopenia  No fevers no chills no prior inguinal hernia operations. Of note he did have a history of remote cholecystectomy and more recently history of choledocholithiasis status post ERCP.  He does have a significant heart history to include coronary artery disease with prior MI.  Status post CABG.   He Uses a walker but is very functional and his mind is pristine.  Accompanied by his wife  .  He does have some degree of thrombocytopenia with platelets of 79,000 otherwise his CBC is unremarkable.  CMP shows chronic renal insufficiency with a baseline creatinine of 2.34.  He did have an ultrasound that I personally reviewed showing evidence of choledocholithiasis.  I did d/w Dr Christi ( Cards). And he reviewed his chart and opined that Mr. Hengel is  low-moderate CV risk.  He recommended that he receives his metoprolol  on the day of surgery and all postop days.  Past Medical History:  Diagnosis Date   Anemia    Arthritis    CAD (coronary artery disease)    a. CABG 1989, b. Myoview  low risk 2012.   Carotid artery occlusion    a. Duplex 10/2014: patent R/L CEA with mild hyperplasia in right surgical bulb - followed by vascular.   CHB (complete heart block) [facility]) March 2016   a. s/p STJ dual chamber pacemaker 07/2014.   Chronic diastolic CHF (congestive heart failure) (HCC)    a. Dx 07/2014 - acute diastolic CHF in the setting of  CHB.   Chronic kidney disease, stage IV (severe) (HCC)    stage III/IV as of 2015, Dr Francina Nephologist   Colon polyps    Complication of anesthesia    Constipation    CVA (cerebral infarction)    Diabetes mellitus    type II   Diverticulosis    Dupuytren's disease    finger right hand contracted   Essential hypertension    GERD (gastroesophageal reflux disease)    Hemorrhoids    Hyperlipidemia    Myocardial infarction (HCC) 1976  and Mar. 19, 2016   Orthostasis    Osteoporosis    PAF (paroxysmal atrial fibrillation) (HCC) March 2016   PONV (postoperative nausea and vomiting)    Presence of permanent cardiac pacemaker    Shortness of breath dyspnea    due to pain    Past Surgical History:  Procedure Laterality Date   CARDIAC CATHETERIZATION  08/11/2014   Procedure: TEMPORARY PACEMAKER;  Surgeon: Dorn JINNY Lesches, MD;  Location: [facility] CATH LAB;  Service: Cardiovascular;;   CAROTID ENDARTERECTOMY  12/31/10   Right   CAROTID ENDARTERECTOMY  02/09/11   left   CHOLECYSTECTOMY     COLONOSCOPY WITH PROPOFOL  N/A 08/10/2022   Procedure: COLONOSCOPY WITH PROPOFOL ;  Surgeon: Shila Gustav GAILS, MD;  Location: [facility] ENDOSCOPY;  Service:  Gastroenterology;  Laterality: N/A;   CORONARY ARTERY BYPASS GRAFT  1989   ENDOSCOPIC RETROGRADE CHOLANGIOPANCREATOGRAPHY (ERCP) WITH PROPOFOL  N/A 01/27/2022   Procedure: ENDOSCOPIC RETROGRADE CHOLANGIOPANCREATOGRAPHY (ERCP) WITH PROPOFOL ;  Surgeon: Aneita Gwendlyn DASEN, MD;  Location: [facility] ENDOSCOPY;  Service: Gastroenterology;  Laterality: N/A;   ESOPHAGOGASTRODUODENOSCOPY N/A 08/09/2022   Procedure: ESOPHAGOGASTRODUODENOSCOPY (EGD);  Surgeon: Legrand Victory LITTIE DOUGLAS, MD;  Location: [facility] ENDOSCOPY;  Service: Gastroenterology;  Laterality: N/A;   EYE SURGERY Bilateral    Cataract with implants   HOT HEMOSTASIS N/A 08/10/2022   Procedure: HOT HEMOSTASIS (ARGON PLASMA COAGULATION/BICAP);  Surgeon: Nandigam, Kavitha V, MD;  Location: [facility] ENDOSCOPY;  Service: Gastroenterology;   Laterality: N/A;   I & D KNEE WITH POLY EXCHANGE Left 07/29/2015   Procedure: IRRIGATION AND DEBRIDEMENT LEFT KNEE WITH POLY EXCHANGE;  Surgeon: Redell Shoals, MD;  Location: [facility] OR;  Service: Orthopedics;  Laterality: Left;   IR GENERIC HISTORICAL  03/04/2016   IR US  GUIDE VASC ACCESS RIGHT 03/04/2016 Rome Hall, MD [facility]INTER[facility]   IR GENERIC HISTORICAL  03/04/2016   IR FLUORO GUIDE CV LINE RIGHT 03/04/2016 Rome Hall, MD [facility]   IR KYPHO LUMBAR INC FX REDUCE BONE BX UNI/BIL CANNULATION INC/IMAGING  08/13/2022   JOINT REPLACEMENT     bilat. knees   PERMANENT PACEMAKER INSERTION N/A 08/13/2014   STJ dual chamber pacemaker implanted by Dr Waddell for CHB   REMOVAL OF STONES  01/27/2022   Procedure: REMOVAL OF STONES;  Surgeon: Aneita Gwendlyn DASEN, MD;  Location: [facility];  Service: Gastroenterology;;   ANNETT  01/27/2022   Procedure: ANNETT;  Surgeon: Aneita Gwendlyn DASEN, MD;  Location: [facility] ENDOSCOPY;  Service: Gastroenterology;;   TONSILLECTOMY     TOTAL KNEE ARTHROPLASTY     bilateral    Family History  Problem Relation Age of Onset   Heart disease Mother        Before age 5   Diabetes Mother    Varicose Veins Mother    Heart attack Mother    Heart attack Father    Heart disease Father        After age 67   Hypertension Father    Heart disease Brother        Before age 28   Hypertension Brother    Heart attack Brother    Diabetes Son    Hypertension Son    Hypertension Son    Diabetes Son    Diabetes Other    Cancer Other    Colon cancer Neg Hx     Social History:  reports that he quit smoking about 50 years ago. His smoking use included cigarettes. He started smoking about 75 years ago. He has a 25 pack-year smoking history. He has been exposed to tobacco smoke. He has never used smokeless tobacco. He reports current alcohol  use. He reports that he does not use drugs.  Allergies:  Allergies  Allergen Reactions   Zoledronic  Acid Other (See Comments)     Stopped 2015 due to Creatine  Other reaction(s): Unknown Stopped 2015 due to Cr   Nsaids Other (See Comments)    Held due to creatinine elevation.      Medications reviewed.    ROS Full ROS performed and is otherwise negative other than what is stated in HPI   BP (!) 112/59   Pulse (!) 114   Temp 97.8 F (36.6 C) (Oral)   Ht 5' 11 (1.803 m)   Wt 190 lb 12.8 oz (86.5 kg)  SpO2 94%   BMI 26.61 kg/m   Physical Exam EYES: Pupils are equal, round, and , Sclera are non-icteric. EARS, NOSE, MOUTH AND THROAT: The oropharynx is clear. The oral mucosa is pink and moist. Hearing is intact to voice. LYMPH NODES:  Lymph nodes in the neck are normal. RESPIRATORY:  Lungs are clear. There is normal respiratory effort, with equal breath sounds bilaterally, and without pathologic use of accessory muscles. CARDIOVASCULAR: Heart is regular without murmurs, gallops, or rubs. GI: The abdomen is  soft, nontender, and nondistended. There are no palpable masses. There is no hepatosplenomegaly. There are normal bowel sounds. Evidence of reducible right inguinal hernia. Small asymptomatic umbilical hernia GU: Rectal deferred.   MUSCULOSKELETAL: Normal muscle strength and tone. No cyanosis or edema.   SKIN: Turgor is good and there are no pathologic skin lesions or ulcers. NEUROLOGIC: Motor and sensation is grossly normal. Cranial nerves are grossly intact. PSYCH:  Oriented to person, place and time. Affect is normal.   Assessment/Plan: 89 year old male with symptomatic right inguinal hernia and crescendo symptoms.  I had an extensive discussion with the patient and his wife. We Have already given him the benefit of time and actually his symptoms have worsened. Unfortunately I do not think that this will resolve without surgical intervention.  We went ahead also and discussed different options of repair to include robotic approach versus open.  The risks, the benefits and the possible  complications including bleeding, infection, chronic pain, cardiovascular complications as well as wound complications.    Do think that given his thrombocytopenia and heart issues probably open approach will be the safest route.  They seem comfortable with this approach and they wish to move forward ASAP given his sxs.   Please note that I spent 40 minutes in this encounter including personally reviewing imaging studies, reviewing extensive medical records, counseling the patient, placing orders and performing appropriate documentation     Laneta Luna, MD [facility] General Surgeon

## 2023-06-30 NOTE — Telephone Encounter (Signed)
 Faxed cardiac clearance to Dr. Karyl Paget Croitoru at [phone].

## 2023-06-30 NOTE — Progress Notes (Signed)
 Outpatient Surgical Follow Up  06/30/2023  Dean Murphy is an 89 y.o. male.   Chief Complaint  Patient presents with   Follow-up    Right inguinal pain    HPI: Dean Murphy is a 89 y.o. male seen for a right inguinal hernia.  He reports that he has this hernia for few years now.  He expresses that he is able to reduce the hernia.  Does report that over the last year or so his symptoms have worsened.  Please note that I saw him close to a year and a half ago and at that time we decided a lot of intervention. . Taken off anticoagulation.  He does have chronic thrombocytopenia  No fevers no chills no prior inguinal hernia operations. Of note he did have a history of remote cholecystectomy and more recently history of choledocholithiasis status post ERCP.  He does have a significant heart history to include coronary artery disease with prior MI.  Status post CABG.   He Uses a walker but is very functional and his mind is pristine.  Accompanied by his wife  .  He does have some degree of thrombocytopenia with platelets of 79,000 otherwise his CBC is unremarkable.  CMP shows chronic renal insufficiency with a baseline creatinine of 2.34.  He did have an ultrasound that I personally reviewed showing evidence of choledocholithiasis.  I did d/w Dr Christi ( Cards). And he reviewed his chart and opined that Mr. Hengel is  low-moderate CV risk.  He recommended that he receives his metoprolol  on the day of surgery and all postop days.  Past Medical History:  Diagnosis Date   Anemia    Arthritis    CAD (coronary artery disease)    a. CABG 1989, b. Myoview  low risk 2012.   Carotid artery occlusion    a. Duplex 10/2014: patent R/L CEA with mild hyperplasia in right surgical bulb - followed by vascular.   CHB (complete heart block) [facility]) March 2016   a. s/p STJ dual chamber pacemaker 07/2014.   Chronic diastolic CHF (congestive heart failure) (HCC)    a. Dx 07/2014 - acute diastolic CHF in the setting of  CHB.   Chronic kidney disease, stage IV (severe) (HCC)    stage III/IV as of 2015, Dr Francina Nephologist   Colon polyps    Complication of anesthesia    Constipation    CVA (cerebral infarction)    Diabetes mellitus    type II   Diverticulosis    Dupuytren's disease    finger right hand contracted   Essential hypertension    GERD (gastroesophageal reflux disease)    Hemorrhoids    Hyperlipidemia    Myocardial infarction (HCC) 1976  and Mar. 19, 2016   Orthostasis    Osteoporosis    PAF (paroxysmal atrial fibrillation) (HCC) March 2016   PONV (postoperative nausea and vomiting)    Presence of permanent cardiac pacemaker    Shortness of breath dyspnea    due to pain    Past Surgical History:  Procedure Laterality Date   CARDIAC CATHETERIZATION  08/11/2014   Procedure: TEMPORARY PACEMAKER;  Surgeon: Dorn JINNY Lesches, MD;  Location: [facility] CATH LAB;  Service: Cardiovascular;;   CAROTID ENDARTERECTOMY  12/31/10   Right   CAROTID ENDARTERECTOMY  02/09/11   left   CHOLECYSTECTOMY     COLONOSCOPY WITH PROPOFOL  N/A 08/10/2022   Procedure: COLONOSCOPY WITH PROPOFOL ;  Surgeon: Shila Gustav GAILS, MD;  Location: [facility] ENDOSCOPY;  Service:  Gastroenterology;  Laterality: N/A;   CORONARY ARTERY BYPASS GRAFT  1989   ENDOSCOPIC RETROGRADE CHOLANGIOPANCREATOGRAPHY (ERCP) WITH PROPOFOL  N/A 01/27/2022   Procedure: ENDOSCOPIC RETROGRADE CHOLANGIOPANCREATOGRAPHY (ERCP) WITH PROPOFOL ;  Surgeon: Aneita Gwendlyn DASEN, MD;  Location: [facility] ENDOSCOPY;  Service: Gastroenterology;  Laterality: N/A;   ESOPHAGOGASTRODUODENOSCOPY N/A 08/09/2022   Procedure: ESOPHAGOGASTRODUODENOSCOPY (EGD);  Surgeon: Legrand Victory LITTIE DOUGLAS, MD;  Location: [facility] ENDOSCOPY;  Service: Gastroenterology;  Laterality: N/A;   EYE SURGERY Bilateral    Cataract with implants   HOT HEMOSTASIS N/A 08/10/2022   Procedure: HOT HEMOSTASIS (ARGON PLASMA COAGULATION/BICAP);  Surgeon: Nandigam, Kavitha V, MD;  Location: [facility] ENDOSCOPY;  Service: Gastroenterology;   Laterality: N/A;   I & D KNEE WITH POLY EXCHANGE Left 07/29/2015   Procedure: IRRIGATION AND DEBRIDEMENT LEFT KNEE WITH POLY EXCHANGE;  Surgeon: Redell Shoals, MD;  Location: [facility] OR;  Service: Orthopedics;  Laterality: Left;   IR GENERIC HISTORICAL  03/04/2016   IR US  GUIDE VASC ACCESS RIGHT 03/04/2016 Rome Hall, MD [facility]INTER[facility] RAD   IR GENERIC HISTORICAL  03/04/2016   IR FLUORO GUIDE CV LINE RIGHT 03/04/2016 Rome Hall, MD [facility]   IR KYPHO LUMBAR INC FX REDUCE BONE BX UNI/BIL CANNULATION INC/IMAGING  08/13/2022   JOINT REPLACEMENT     bilat. knees   PERMANENT PACEMAKER INSERTION N/A 08/13/2014   STJ dual chamber pacemaker implanted by Dr Waddell for CHB   REMOVAL OF STONES  01/27/2022   Procedure: REMOVAL OF STONES;  Surgeon: Aneita Gwendlyn DASEN, MD;  Location: [facility];  Service: Gastroenterology;;   ANNETT  01/27/2022   Procedure: ANNETT;  Surgeon: Aneita Gwendlyn DASEN, MD;  Location: [facility] ENDOSCOPY;  Service: Gastroenterology;;   TONSILLECTOMY     TOTAL KNEE ARTHROPLASTY     bilateral    Family History  Problem Relation Age of Onset   Heart disease Mother        Before age 5   Diabetes Mother    Varicose Veins Mother    Heart attack Mother    Heart attack Father    Heart disease Father        After age 67   Hypertension Father    Heart disease Brother        Before age 28   Hypertension Brother    Heart attack Brother    Diabetes Son    Hypertension Son    Hypertension Son    Diabetes Son    Diabetes Other    Cancer Other    Colon cancer Neg Hx     Social History:  reports that he quit smoking about 50 years ago. His smoking use included cigarettes. He started smoking about 75 years ago. He has a 25 pack-year smoking history. He has been exposed to tobacco smoke. He has never used smokeless tobacco. He reports current alcohol  use. He reports that he does not use drugs.  Allergies:  Allergies  Allergen Reactions   Zoledronic  Acid Other (See Comments)     Stopped 2015 due to Creatine  Other reaction(s): Unknown Stopped 2015 due to Cr   Nsaids Other (See Comments)    Held due to creatinine elevation.      Medications reviewed.    ROS Full ROS performed and is otherwise negative other than what is stated in HPI   BP (!) 112/59   Pulse (!) 114   Temp 97.8 F (36.6 C) (Oral)   Ht 5' 11 (1.803 m)   Wt 190 lb 12.8 oz (86.5 kg)  SpO2 94%   BMI 26.61 kg/m   Physical Exam EYES: Pupils are equal, round, and , Sclera are non-icteric. EARS, NOSE, MOUTH AND THROAT: The oropharynx is clear. The oral mucosa is pink and moist. Hearing is intact to voice. LYMPH NODES:  Lymph nodes in the neck are normal. RESPIRATORY:  Lungs are clear. There is normal respiratory effort, with equal breath sounds bilaterally, and without pathologic use of accessory muscles. CARDIOVASCULAR: Heart is regular without murmurs, gallops, or rubs. GI: The abdomen is  soft, nontender, and nondistended. There are no palpable masses. There is no hepatosplenomegaly. There are normal bowel sounds. Evidence of reducible right inguinal hernia. Small asymptomatic umbilical hernia GU: Rectal deferred.   MUSCULOSKELETAL: Normal muscle strength and tone. No cyanosis or edema.   SKIN: Turgor is good and there are no pathologic skin lesions or ulcers. NEUROLOGIC: Motor and sensation is grossly normal. Cranial nerves are grossly intact. PSYCH:  Oriented to person, place and time. Affect is normal.   Assessment/Plan: 89 year old male with symptomatic right inguinal hernia and crescendo symptoms.  I had an extensive discussion with the patient and his wife. We Have already given him the benefit of time and actually his symptoms have worsened. Unfortunately I do not think that this will resolve without surgical intervention.  We went ahead also and discussed different options of repair to include robotic approach versus open.  The risks, the benefits and the possible  complications including bleeding, infection, chronic pain, cardiovascular complications as well as wound complications.    Do think that given his thrombocytopenia and heart issues probably open approach will be the safest route.  They seem comfortable with this approach and they wish to move forward ASAP given his sxs.   Please note that I spent 40 minutes in this encounter including personally reviewing imaging studies, reviewing extensive medical records, counseling the patient, placing orders and performing appropriate documentation     Laneta Luna, MD [facility] General Surgeon

## 2023-07-01 ENCOUNTER — Encounter: Payer: Self-pay | Admitting: Surgery

## 2023-07-01 ENCOUNTER — Telehealth: Payer: Self-pay | Admitting: Surgery

## 2023-07-01 ENCOUNTER — Encounter: Payer: Self-pay | Admitting: Internal Medicine

## 2023-07-01 ENCOUNTER — Encounter: Payer: Self-pay | Admitting: Urgent Care

## 2023-07-01 ENCOUNTER — Encounter
Admission: RE | Admit: 2023-07-01 | Discharge: 2023-07-01 | Disposition: A | Discharge status: Home / Self Care | Payer: Medicare Other | Source: Ambulatory Visit | Attending: Surgery | Admitting: Surgery

## 2023-07-01 ENCOUNTER — Other Ambulatory Visit: Payer: Self-pay

## 2023-07-01 ENCOUNTER — Encounter: Payer: Self-pay | Admitting: Family Medicine

## 2023-07-01 VITALS — BP 163/59 | HR 68 | Temp 97.9°F | Resp 16 | Ht 71.0 in | Wt 191.0 lb

## 2023-07-01 DIAGNOSIS — I6523 Occlusion and stenosis of bilateral carotid arteries: Secondary | ICD-10-CM | POA: Diagnosis not present

## 2023-07-01 DIAGNOSIS — K219 Gastro-esophageal reflux disease without esophagitis: Secondary | ICD-10-CM | POA: Diagnosis not present

## 2023-07-01 DIAGNOSIS — Z9049 Acquired absence of other specified parts of digestive tract: Secondary | ICD-10-CM | POA: Diagnosis not present

## 2023-07-01 DIAGNOSIS — N184 Chronic kidney disease, stage 4 (severe): Secondary | ICD-10-CM | POA: Diagnosis not present

## 2023-07-01 DIAGNOSIS — Z79899 Other long term (current) drug therapy: Secondary | ICD-10-CM | POA: Diagnosis not present

## 2023-07-01 DIAGNOSIS — I252 Old myocardial infarction: Secondary | ICD-10-CM | POA: Diagnosis not present

## 2023-07-01 DIAGNOSIS — I480 Paroxysmal atrial fibrillation: Secondary | ICD-10-CM | POA: Diagnosis not present

## 2023-07-01 DIAGNOSIS — I5032 Chronic diastolic (congestive) heart failure: Secondary | ICD-10-CM | POA: Diagnosis not present

## 2023-07-01 DIAGNOSIS — E785 Hyperlipidemia, unspecified: Secondary | ICD-10-CM | POA: Diagnosis not present

## 2023-07-01 DIAGNOSIS — D500 Iron deficiency anemia secondary to blood loss (chronic): Secondary | ICD-10-CM | POA: Diagnosis not present

## 2023-07-01 DIAGNOSIS — Z8673 Personal history of transient ischemic attack (TIA), and cerebral infarction without residual deficits: Secondary | ICD-10-CM | POA: Diagnosis not present

## 2023-07-01 DIAGNOSIS — R54 Age-related physical debility: Secondary | ICD-10-CM | POA: Diagnosis not present

## 2023-07-01 DIAGNOSIS — Z794 Long term (current) use of insulin: Secondary | ICD-10-CM | POA: Diagnosis not present

## 2023-07-01 DIAGNOSIS — Z87891 Personal history of nicotine dependence: Secondary | ICD-10-CM | POA: Diagnosis not present

## 2023-07-01 DIAGNOSIS — Z951 Presence of aortocoronary bypass graft: Secondary | ICD-10-CM | POA: Diagnosis not present

## 2023-07-01 DIAGNOSIS — I130 Hypertensive heart and chronic kidney disease with heart failure and stage 1 through stage 4 chronic kidney disease, or unspecified chronic kidney disease: Secondary | ICD-10-CM | POA: Diagnosis not present

## 2023-07-01 DIAGNOSIS — I2510 Atherosclerotic heart disease of native coronary artery without angina pectoris: Secondary | ICD-10-CM | POA: Diagnosis not present

## 2023-07-01 DIAGNOSIS — I442 Atrioventricular block, complete: Secondary | ICD-10-CM | POA: Diagnosis not present

## 2023-07-01 DIAGNOSIS — Z01812 Encounter for preprocedural laboratory examination: Secondary | ICD-10-CM

## 2023-07-01 DIAGNOSIS — E1122 Type 2 diabetes mellitus with diabetic chronic kidney disease: Secondary | ICD-10-CM | POA: Diagnosis not present

## 2023-07-01 DIAGNOSIS — D696 Thrombocytopenia, unspecified: Secondary | ICD-10-CM | POA: Diagnosis not present

## 2023-07-01 DIAGNOSIS — E1151 Type 2 diabetes mellitus with diabetic peripheral angiopathy without gangrene: Secondary | ICD-10-CM | POA: Diagnosis not present

## 2023-07-01 DIAGNOSIS — K4030 Unilateral inguinal hernia, with obstruction, without gangrene, not specified as recurrent: Secondary | ICD-10-CM | POA: Diagnosis present

## 2023-07-01 LAB — TYPE AND SCREEN
ABO/RH(D): AB POS
Antibody Screen: NEGATIVE

## 2023-07-01 LAB — CBC WITH DIFFERENTIAL/PLATELET
Abs Immature Granulocytes: 0.01 10*3/uL (ref 0.00–0.07)
Basophils Absolute: 0 10*3/uL (ref 0.0–0.1)
Basophils Relative: 1 %
Eosinophils Absolute: 0.1 10*3/uL (ref 0.0–0.5)
Eosinophils Relative: 3 %
HCT: 29.1 % — ABNORMAL LOW (ref 39.0–52.0)
Hemoglobin: 9 g/dL — ABNORMAL LOW (ref 13.0–17.0)
Immature Granulocytes: 0 %
Lymphocytes Relative: 15 %
Lymphs Abs: 0.6 10*3/uL — ABNORMAL LOW (ref 0.7–4.0)
MCH: 28.7 pg (ref 26.0–34.0)
MCHC: 30.9 g/dL (ref 30.0–36.0)
MCV: 92.7 fL (ref 80.0–100.0)
Monocytes Absolute: 0.3 10*3/uL (ref 0.1–1.0)
Monocytes Relative: 6 %
Neutro Abs: 3.2 10*3/uL (ref 1.7–7.7)
Neutrophils Relative %: 75 %
Platelets: 90 10*3/uL — ABNORMAL LOW (ref 150–400)
RBC: 3.14 MIL/uL — ABNORMAL LOW (ref 4.22–5.81)
RDW: 15.8 % — ABNORMAL HIGH (ref 11.5–15.5)
WBC: 4.3 10*3/uL (ref 4.0–10.5)
nRBC: 0 % (ref 0.0–0.2)

## 2023-07-01 LAB — BASIC METABOLIC PANEL
Anion gap: 9 (ref 5–15)
BUN: 31 mg/dL — ABNORMAL HIGH (ref 8–23)
CO2: 22 mmol/L (ref 22–32)
Calcium: 9.1 mg/dL (ref 8.9–10.3)
Chloride: 113 mmol/L — ABNORMAL HIGH (ref 98–111)
Creatinine, Ser: 2.26 mg/dL — ABNORMAL HIGH (ref 0.61–1.24)
GFR, Estimated: 26 mL/min — ABNORMAL LOW (ref >60)
Glucose, Bld: 161 mg/dL — ABNORMAL HIGH (ref 70–99)
Potassium: 4.6 mmol/L (ref 3.5–5.1)
Sodium: 144 mmol/L (ref 135–145)

## 2023-07-01 NOTE — Progress Notes (Signed)
 PERIOPERATIVE PRESCRIPTION FOR IMPLANTED CARDIAC DEVICE PROGRAMMING  Patient Information: Name:  Dean Murphy  DOB:  May 21, 1927  MRN:  [id number]    Planned Procedure: HERNIA REPAIR INGUINAL ADULT   Surgeon:  Dr. Laneta Luna, MD  Requesting device clearance: Dorise Pereyra, FNP-C  Date of Procedure:  07/02/2023  Cautery will be used.   Please route documentation back me via [facility], or may fax report to [facility] PAT APP at [phone].  Device Information:  Clinic EP Physician:  Danelle Birmingham, MD   Device Type:  Pacemaker Manufacturer and Phone #:  [employer]: [phone] Pacemaker Dependent?:  Yes.   Date of Last Device Check:  04/27/2023 Normal Device Function?:  Yes.    Electrophysiologist's Recommendations:  Have magnet available. Provide continuous ECG monitoring when magnet is used or reprogramming is to be performed.  Procedure may interfere with device function.  Magnet should be placed over device during procedure.  Per Device Clinic Standing Orders, [name], RN  9:39 AM 07/01/2023

## 2023-07-01 NOTE — Patient Instructions (Signed)
 Your procedure is scheduled on: Friday, February 7 Report to the Registration Desk on the 1st floor of the [facility] To find out your arrival time, please call [phone] between 1PM - 3PM on: Thursday, February 6 If your arrival time is 6:00 am, do not arrive before that time as the [facility] entrance doors do not open until 6:00 am.  REMEMBER: Instructions that are not followed completely may result in serious medical risk, up to and including death; or upon the discretion of your surgeon and anesthesiologist your surgery may need to be rescheduled.  Do not eat or drink after midnight the night before surgery.  No gum chewing or hard candies.  One week prior to surgery:  Stop Anti-inflammatories (NSAIDS) such as Advil, Aleve, Ibuprofen, Motrin, Naproxen, Naprosyn and Aspirin  based products such as Excedrin, Goody's Powder, BC Powder. Stop ANY OVER THE COUNTER supplements until after surgery.  You may however, continue to take Tylenol  if needed for pain up until the day of surgery.  Continue taking all of your other prescription medications up until the day of surgery.  Lantus  - only take half of your regular nightly dose the night before surgery. Only take 23 units of Lantus  Thursday night, February 6.   ON THE DAY OF SURGERY ONLY TAKE THESE MEDICATIONS WITH SIPS OF WATER :  Amlodipine  Pantoprazole  Rosuvastatin   No Alcohol  for 24 hours before or after surgery.  No Smoking including e-cigarettes for 24 hours before surgery.  No chewable tobacco products for at least 6 hours before surgery.  No nicotine patches on the day of surgery.  Do not use any recreational drugs for at least a week (preferably 2 weeks) before your surgery.  Please be advised that the combination of cocaine and anesthesia may have negative outcomes, up to and including death. If you test positive for cocaine, your surgery will be cancelled.  On the morning of surgery brush your teeth with  toothpaste and water , you may rinse your mouth with mouthwash if you wish. Do not swallow any toothpaste or mouthwash.  Use CHG Soap as directed on instruction sheet.  Do not wear jewelry, make-up, hairpins, clips or nail polish.  For welded (permanent) jewelry: bracelets, anklets, waist bands, etc.  Please have this removed prior to surgery.  If it is not removed, there is a chance that hospital personnel will need to cut it off on the day of surgery.  Do not wear lotions, powders, or perfumes.   Do not shave body hair from the neck down 48 hours before surgery.  Contact lenses, hearing aids and dentures may not be worn into surgery.  Do not bring valuables to the hospital. [facility] is not responsible for any missing/lost belongings or valuables.   Notify your doctor if there is any change in your medical condition (cold, fever, infection).  Wear comfortable clothing (specific to your surgery type) to the hospital.  After surgery, you can help prevent lung complications by doing breathing exercises.  Take deep breaths and cough every 1-2 hours. Your doctor may order a device called an Incentive Spirometer to help you take deep breaths. When coughing or sneezing, hold a pillow firmly against your incision with both hands. This is called "splinting." Doing this helps protect your incision. It also decreases belly discomfort.  If you are being discharged the day of surgery, you will not be allowed to drive home. You will need a responsible individual to drive you home and stay with you for  24 hours after surgery.   If you are taking public transportation, you will need to have a responsible individual with you.  Please call the Pre-admissions Testing Dept. at [phone] if you have any questions about these instructions.  Surgery Visitation Policy:  Patients having surgery or a procedure may have two visitors.  Children under the age of 18 must have an adult with them who is  not the patient.  Temporary Visitor Restrictions Due to increasing cases of flu, RSV and COVID-19: Children ages 56 and under will not be able to visit patients in [facility] hospitals under most circumstances.      Preparing for Surgery with CHLORHEXIDINE  GLUCONATE (CHG) Soap  Chlorhexidine  Gluconate (CHG) Soap  o An antiseptic cleaner that kills germs and bonds with the skin to continue killing germs even after washing  o Used for showering the night before surgery and morning of surgery  Before surgery, you can play an important role by reducing the number of germs on your skin.  CHG (Chlorhexidine  gluconate) soap is an antiseptic cleanser which kills germs and bonds with the skin to continue killing germs even after washing.  Please do not use if you have an allergy to CHG or antibacterial soaps. If your skin becomes reddened/irritated stop using the CHG.  1. Shower the NIGHT BEFORE SURGERY and the MORNING OF SURGERY with CHG soap.  2. If you choose to wash your hair, wash your hair first as usual with your normal shampoo.  3. After shampooing, rinse your hair and body thoroughly to remove the shampoo.  4. Use CHG as you would any other liquid soap. You can apply CHG directly to the skin and wash gently with a scrungie or a clean washcloth.  5. Apply the CHG soap to your body only from the neck down. Do not use on open wounds or open sores. Avoid contact with your eyes, ears, mouth, and genitals (private parts). Wash face and genitals (private parts) with your normal soap.  6. Wash thoroughly, paying special attention to the area where your surgery will be performed.  7. Thoroughly rinse your body with warm water .  8. Do not shower/wash with your normal soap after using and rinsing off the CHG soap.  9. Pat yourself dry with a clean towel.  10. Wear clean pajamas to bed the night before surgery.  12. Place clean sheets on your bed the night of your first shower and do  not sleep with pets.  13. Shower again with the CHG soap on the day of surgery prior to arriving at the hospital.  14. Do not apply any deodorants/lotions/powders.  15. Please wear clean clothes to the hospital.

## 2023-07-01 NOTE — Progress Notes (Addendum)
 Perioperative / Anesthesia Services  Pre-Admission Testing Clinical Review / Pre-Operative Anesthesia Consult  Date: 07/01/23  Patient Demographics:  Name: Dean Murphy DOB: [birth year] MRN:   [id number]  Planned Surgical Procedure(s):    Case: [id number] Date/Time: 07/02/23 0845   Procedure: HERNIA REPAIR INGUINAL ADULT, open, RNFA to assist (Right)   Anesthesia type: General   Pre-op diagnosis: inguinal hernia initial reducible   Location: [facility] OR ROOM 09 / [facility]S FOR ANEST[facility]   Surgeons: Jordis Laneta JULIANNA, MD      NOTE: Available PAT nursing documentation and vital signs have been reviewed. Clinical nursing staff has updated patient's PMH/PSHx, current medication list, and drug allergies/intolerances to ensure comprehensive history available to assist in medical decision making as it pertains to the aforementioned surgical procedure and anticipated anesthetic course. Extensive review of available clinical information personally performed. [place] PMH and PSHx updated with any diagnoses/procedures that  may have been inadvertently omitted during his intake with the pre-admission testing department's nursing staff.  Clinical Discussion:  Dean Murphy is a 89 y.o. male who is submitted for pre-surgical anesthesia review and clearance prior to him undergoing the above procedure. Patient is a Former Smoker (25 pack years; quit 12/1972). Pertinent PMH includes: CAD (s/p CABG), MI x 2, symptomatic bradycardia secondary to complete heart block (s/p PPM placement), HFpEF, PAF, lacunar infarction (LEFT cerebellum and BILATERAL basal ganglia), cerebral microvascular disease, PVD, BILATERAL carotid artery disease (s/p BILATERAL carotid endarterectomies), aortic atherosclerosis, HTN, HLD, T2DM, CKD-IV, DOE, GERD (on daily PPI), hiatal hernia, intestinal AVM (s/p APC), TMJ, chronic blood loss anemia, thrombocytopenia, RIGHT inguinal hernia, OA, scoliosis, lumbar DDD, chronic BILATERAL L5 pars  defect, chronic lower back pain, insomnia.  Patient is followed by cardiology Ron, MD). He was last seen in the cardiology clinic on 05/11/2023; notes reviewed. At the time of his clinic visit, patient doing well overall from a cardiovascular perspective. Patient's only complaint was that it was easy for him to become fatigued. Patient denied any chest pain, shortness of breath, PND, orthopnea, palpitations, significant peripheral edema, weakness, fatigue, vertiginous symptoms, or presyncope/syncope. Patient with a past medical history significant for cardiovascular diagnoses. Documented physical exam was grossly benign, providing no evidence of acute exacerbation and/or decompensation of the patient's known cardiovascular conditions.  Of note, records regarding patient's complete cardiovascular history unavailable for review at time of consult.  Information gathered from interview with patient/spouse and from extensive review of available medical records.  Patient reported to have suffered an MI in 89. Type and details surrounding cardiovascular event unavailable for review.  Patient went on to have a 5 vessel revascularization procedure in 1989.  Again, information regarding this procedure was not available for review.  Bypassed vessels unknown.  MRI imaging of the brain performed on 01/01/2011 revealed evidence of remote lacunar infarcts of the LEFT cerebellum and basal ganglia BILATERALLY.  Patient unaware of any previous neurological events.  He has no significant neurological deficits following discovery of remote infarcts.  Patient underwent myocardial perfusion imaging study on 01/02/2011 revealing a normal left ventricular systolic function with a hyperdynamic LVEF of 75%.  There was no evidence of stress-induced myocardial ischemia or arrhythmia; no scintigraphic evidence of scar.  Study determined to be normal and low risk.  Patient with a history of severe BILATERAL carotid artery  disease.  Patient definitively treated with BILATERAL carotid endarterectomies on 12/31/2010 (RIGHT) and 02/09/2011 (LEFT). Most recent follow-up imaging of the carotids was performed on 05/13/2023 revealing a 1-39% stenosis  of the patient's BILATERAL internal carotid arteries.  Vertebrals demonstrated antegrade flow.  Flow from the RIGHT subclavian was disturbed, however hemodynamic seen in the LEFT subclavian artery were normal.  Patient suffered an NSTEMI on 08/11/2014. Troponins were trended: 0.35 --> 0.54 --> 0.54 ng/mL.  Surrounding the time of his NSTEMI, patient developed symptomatic bradycardia secondary to complete heart block.  Patient required temporary pacemaker placement and ICU admission.  Ultimately, patient underwent placement of a dual-chamber St. Jude Assurity PPM device to his LEFT infraclavicular deltopectoral region on 08/13/2014.  Device is regularly interrogated by patient's primary electrophysiology team.  Device last interrogated on 04/27/2023, at which time device was noted to be functioning properly.  Most recent TTE was performed on 12/25/2022 revealing a normal left ventricular systolic function with an EF of 60 to 65%. There were no regional wall motion abnormalities. There was mild LVH. Left ventricular diastolic Doppler parameters consistent with abnormal relaxation (G1DD). Right ventricular size and function normal. There was mild mitral valve regurgitation. Aortic valve with mild sclerosis noted. All transvalvular gradients were noted to be normal providing no evidence suggestive of valvular stenosis. Aorta normal in size with no evidence of ectasia or aneurysmal dilatation.  Patient with an atrial fibrillation diagnosis; CHA2DS2-VASc Score = 8 (age x 2, sex, HFpEF, HTN, CVA x 2, vascular disease, T2DM). His rate and rhythm are currently being maintained on oral metoprolol  succinate.  Patient not currently taking any type of oral anticoagulation therapy due to recent  history of black stools.  Blood pressure reasonably controlled at 138/60 mmHg on currently prescribed CCB (amlodipine ), ARB (losartan ), beta-blocker (metoprolol  succinate), and diuretic (torsemide ) therapies.  Patient is on rosuvastatin  for his HLD diagnosis and further ASCVD prevention.  T2DM poorly controlled on patient's current antidiabetic regimen; last HgbA1c was 10.1% when checked on 05/11/2023.  Patient does not have an OSAH diagnosis.  Functional capacity limited by patient's age related debility and multiple medical comorbidities.  With that said, patient able to complete his ADLs/IADLs without cardiovascular limitation.  Per the [facility]AS[facility], patient felt to be able to achieve at least 4 METS of physical activity without experiencing any significant degrees of angina/anginal equivalent symptoms.  No changes were made to his medication regimen.  Patient to follow-up with outpatient cardiology in 6 months or sooner if needed.  ROQUE SCHILL is scheduled for an elective RIGHT INGUINAL HERNIA REPAIR on 07/02/2023 with Dr. Laneta Luna, MD. Given patient's past medical history significant for cardiovascular diagnoses, presurgical clearances were sought from patient's PCP and cardiologist. Speciality clearances were obtained as follows:  Per internal/family medicine Mayer, MD), if this surgery needs to happen, and based on Dr. Dolph clear note it looks like it really does need to happen, then I would proceed as planned. I can't imagine that waiting would improve his situation.   Per cardiology, for a patient of 89 years old, patient is doing well overall from a cardiovascular perspective. Patient should be able to proceed at a LOW-MODERATE cardiovascular risk without the need for further cardiovascular testing. Please ensure that patient receives his metoprolol  on the day of surgery and all postop days.  In review of his medication reconciliation, the patient is not noted to be taking any type of  anticoagulation or antiplatelet therapies that would need to be held during his perioperative course.  Patient reports previous perioperative complications with anesthesia in the past. Patient has a PMH (+) for PONV. Symptoms and history of PONV will be discussed with patient by anesthesia  team on the day of her procedure. Interventions will be ordered as deemed necessary based on patient's individual care needs as determined by anesthesiologist. In review his EMR, it is noted that patient underwent a MAC anesthetic course at [facility] (ASA III) in 07/2022 without documented complications.      07/01/2023   10:40 AM 06/30/2023    4:07 PM 06/17/2023   11:42 AM  Vitals with BMI  Height 5' 11 5' 11 5' 11  Weight 191 lbs 190 lbs 13 oz 193 lbs  BMI 26.65 26.62 26.93  Systolic 163 112 861  Diastolic 59 59 64  Pulse 68 114 69   Providers/Specialists:  NOTE: Primary physician provider listed below. Patient may have been seen by APP or partner within same practice.   PROVIDER ROLE / SPECIALTY LAST SHERLEAN Jordis Laneta JULIANNA, MD General Surgery (Surgeon) 06/30/2023  Cleatus Arlyss RAMAN, MD Primary Care Provider 06/17/2023  Francyne Headland, MD  Cardiology 05/11/2023  Waddell Lusher, MD Electrophysiology 12/11/2022   Allergies:   Allergies  Allergen Reactions   Zoledronic  Acid Other (See Comments)    Discontinued in 2015 due to progressive decrease in renal function (CKD-III/IV)   Nsaids Other (See Comments)    Held due to creatinine elevation.     Current Home Medications:   No current facility-administered medications for this encounter.    acetaminophen  (TYLENOL ) 500 MG tablet   amLODipine  (NORVASC ) 5 MG tablet   amoxicillin  (AMOXIL ) 500 MG capsule   B-D UF III MINI PEN NEEDLES 31G X 5 MM MISC   cholecalciferol (VITAMIN D3) 25 MCG (1000 UNIT) tablet   clotrimazole -betamethasone  (LOTRISONE ) cream   cyanocobalamin  (VITAMIN B12) 1000 MCG tablet   dorzolamide-timolol (COSOPT) 22.3-6.8  MG/ML ophthalmic solution   insulin  glargine (LANTUS  SOLOSTAR) 100 UNIT/ML Solostar Pen   Iron , Ferrous Sulfate , 325 (65 Fe) MG TABS   losartan  (COZAAR ) 100 MG tablet   metoprolol  succinate (TOPROL  XL) 25 MG 24 hr tablet   Multiple Vitamins-Minerals (PRESERVISION AREDS 2) CAPS   pantoprazole  (PROTONIX ) 40 MG tablet   polyethylene glycol powder (GLYCOLAX /MIRALAX ) 17 GM/SCOOP powder   rosuvastatin  (CRESTOR ) 20 MG tablet   torsemide  (DEMADEX ) 10 MG tablet   vitamin C  (ASCORBIC ACID) 250 MG tablet   History:   Past Medical History:  Diagnosis Date   (HFpEF) heart failure with preserved ejection fraction (HCC) 07/2014   a.) initially Dx'd in 07/2014 in setting of CHB; b.) TTE 09/02/2017: EF 55-60%, apical septal HK, G1DD, sev LAE, mild MR, PASP 41; c.) TTE 12/25/2022: EF 60-65%, mild LVH, G1DD, mild AoV sclerosis with no stenosis   Anemia due to chronic blood loss    Aortic atherosclerosis (HCC)    Arthritis    AVM (arteriovenous malformation) of colon    a.)colonoscopy 08/10/2022: angioectasias (AVM) x 2 --> Tx'd with APC   Bilateral carotid artery disease (HCC)    a.) s/p BILATERAL carotid endarterectomies: RIGHT (12/31/2010) and LEFT (02/09/2011); b.) carotid doppler 05/13/2023: 1-39% BICA with disturbed flow from the RIGHT subclavian.   Blindness of right eye    CAD (coronary artery disease)    a.) s/p 5v CABG 1989; b.) MV 01/02/2011: EF 75%, no ischemia   Cerebral microvascular disease    CHB (complete heart block) (HCC) 07/2014   a.) s/p St. Jude Assurity dual chamber PPM 08/13/2014   Chronic BILATERAL L5 pars interarticularis defect    Chronic kidney disease, stage IV (severe) (HCC) 2015   Chronic prostatitis    Chronic radicular  lumbar pain    Colon polyps    Complication of anesthesia    a.) PONV   Constipation    DDD (degenerative disc disease), thoracolumbar    Diverticulosis    DOE (dyspnea on exertion)    Dupuytren's disease of finger and palm of right hand     Essential hypertension    GERD (gastroesophageal reflux disease)    Gout    Hemorrhoids    Hiatal hernia    History of bilateral cataract extraction    HTN (hypertension)    Hyperlipidemia    Insomnia    Lacunar infarction (CVA) 01/01/2011   a,) noted on MRI brain 01/01/2011: remote lacunar infarcts of LEFT cerebellum and basal ganglia BILATERALLY   Myelolipoma of left adrenal gland    Myocardial infarction (HCC) 1976   a.) type/details unknown   NSTEMI (non-ST elevated myocardial infarction) (HCC) 08/11/2014   a.) troponins were trended: 0.35 --> 0.54 --> 0.54 ng/mL   Orthostasis    Osteoporosis    PAF (paroxysmal atrial fibrillation) (HCC) 07/2014   a.) CHA2DS2-VASc = 8 (age x2, sex, HFpEF, HTN, CVA x2, vascular disease, T2DM) as of 07/01/2023; b.) cardiac rate/rhythm maintained on oral metoprolol  succinate; no OAC   PONV (postoperative nausea and vomiting)    Positive ANA (antinuclear antibody)    a.) workup with rheumatology (-) for SLE   Presence of permanent cardiac pacemaker 08/13/2014   a.) s/p St. Jude Assurity dual chamber PPM 08/13/2014 to LEFT infraclavicular deltopectoral region   PVD (peripheral vascular disease) (HCC)    Recurrent falls    Right inguinal hernia    S/P CABG x 5 1989   Scoliosis    Symptomatic bradycardia    a.) s/p St. Jude Assurity dual chamber PPM 08/13/2014   Thoracic compression fracture (L1-L2)    a.) s/p L1 balloon kyphoplasty 08/13/2022   Thrombocytopenia (HCC)    TMJ (dislocation of temporomandibular joint)    Type 2 diabetes mellitus treated with insulin  [facility])    Past Surgical History:  Procedure Laterality Date   CARDIAC CATHETERIZATION  08/11/2014   Procedure: TEMPORARY PACEMAKER;  Surgeon: Dorn JINNY Lesches, MD;  Location: [facility] CATH LAB;  Service: Cardiovascular;;   CAROTID ENDARTERECTOMY Right 12/31/2010   CAROTID ENDARTERECTOMY Left 02/09/2011   CATARACT EXTRACTION W/ INTRAOCULAR LENS IMPLANT Bilateral    CHOLECYSTECTOMY      COLONOSCOPY WITH PROPOFOL  N/A 08/10/2022   Procedure: COLONOSCOPY WITH PROPOFOL ;  Surgeon: Shila Gustav GAILS, MD;  Location: [facility] ENDOSCOPY;  Service: Gastroenterology;  Laterality: N/A;   CORONARY ARTERY BYPASS GRAFT  05/26/1987   x 5 vessels   ENDOSCOPIC RETROGRADE CHOLANGIOPANCREATOGRAPHY (ERCP) WITH PROPOFOL  N/A 01/27/2022   Procedure: ENDOSCOPIC RETROGRADE CHOLANGIOPANCREATOGRAPHY (ERCP) WITH PROPOFOL ;  Surgeon: Aneita Gwendlyn DASEN, MD;  Location: [facility] ENDOSCOPY;  Service: Gastroenterology;  Laterality: N/A;   ESOPHAGOGASTRODUODENOSCOPY N/A 08/09/2022   Procedure: ESOPHAGOGASTRODUODENOSCOPY (EGD);  Surgeon: Legrand Victory LITTIE DOUGLAS, MD;  Location: [facility] ENDOSCOPY;  Service: Gastroenterology;  Laterality: N/A;   HOT HEMOSTASIS N/A 08/10/2022   Procedure: HOT HEMOSTASIS (ARGON PLASMA COAGULATION/BICAP);  Surgeon: Nandigam, Kavitha V, MD;  Location: [facility] ENDOSCOPY;  Service: Gastroenterology;  Laterality: N/A;   I & D KNEE WITH POLY EXCHANGE Left 07/29/2015   Procedure: IRRIGATION AND DEBRIDEMENT LEFT KNEE WITH POLY EXCHANGE;  Surgeon: Redell Shoals, MD;  Location: [facility] OR;  Service: Orthopedics;  Laterality: Left;   IR GENERIC HISTORICAL  03/04/2016   IR US  GUIDE VASC ACCESS RIGHT 03/04/2016 Rome Hall, MD [facility]-INTER[facility] RAD   IR [facility]  HISTORICAL  03/04/2016   IR FLUORO GUIDE CV LINE RIGHT 03/04/2016 Rome Hall, MD [facility]   IR KYPHO LUMBAR INC FX REDUCE BONE BX UNI/BIL CANNULATION INC/IMAGING  08/13/2022   PERMANENT PACEMAKER INSERTION N/A 08/13/2014   STJ dual chamber pacemaker implanted by Dr Waddell for CHB   REMOVAL OF STONES  01/27/2022   Procedure: REMOVAL OF STONES;  Surgeon: Aneita Gwendlyn DASEN, MD;  Location: [facility] ENDOSCOPY;  Service: Gastroenterology;;   ANNETT  01/27/2022   Procedure: SPHINCTEROTOMY;  Surgeon: Aneita Gwendlyn DASEN, MD;  Location: [facility] ENDOSCOPY;  Service: Gastroenterology;;   TONSILLECTOMY     TOTAL KNEE ARTHROPLASTY Bilateral    Family History  Problem Relation Age of  Onset   Heart disease Mother        Before age 40   Diabetes Mother    Varicose Veins Mother    Heart attack Mother    Heart attack Father    Heart disease Father        After age 69   Hypertension Father    Heart disease Brother        Before age 58   Hypertension Brother    Heart attack Brother    Diabetes Son    Hypertension Son    Hypertension Son    Diabetes Son    Diabetes Other    Cancer Other    Colon cancer Neg Hx    Social History   Tobacco Use   Smoking status: Former    Current packs/day: 0.00    Average packs/day: 1 pack/day for 25.0 years (25.0 ttl pk-yrs)    Types: Cigarettes    Start date: 01/22/1948    Quit date: 01/21/1973    Years since quitting: 50.4    Passive exposure: Past   Smokeless tobacco: Never   Tobacco comments:    began smoking in high school, quit age 65  Substance Use Topics   Alcohol  use: Yes    Comment: wine occassional   Pertinent Clinical Results:  LABS:  Preadmission on 07/02/2023  Component Date Value Ref Range Status   Sodium 07/01/2023 144  135 - 145 mmol/L Final   Potassium 07/01/2023 4.6  3.5 - 5.1 mmol/L Final   Chloride 07/01/2023 113 (H)  98 - 111 mmol/L Final   CO2 07/01/2023 22  22 - 32 mmol/L Final   Glucose, Bld 07/01/2023 161 (H)  70 - 99 mg/dL Final   Glucose reference range applies only to samples taken after fasting for at least 8 hours.   BUN 07/01/2023 31 (H)  8 - 23 mg/dL Final   Creatinine, Ser 07/01/2023 2.26 (H)  0.61 - 1.24 mg/dL Final   Calcium  07/01/2023 9.1  8.9 - 10.3 mg/dL Final   GFR, Estimated 07/01/2023 26 (L)  >60 mL/min Final   Comment: (NOTE) Calculated using the CKD-EPI Creatinine Equation (2021)    Anion gap 07/01/2023 9  5 - 15 Final   Performed at [facility], [address] [place], [place] [address]   WBC 07/01/2023 4.3  4.0 - 10.5 K/uL Final   RBC 07/01/2023 3.14 (L)  4.22 - 5.81 MIL/uL Final   Hemoglobin 07/01/2023 9.0 (L)  13.0 - 17.0 g/dL Final   HCT 97/93/7974  29.1 (L)  39.0 - 52.0 % Final   MCV 07/01/2023 92.7  80.0 - 100.0 fL Final   MCH 07/01/2023 28.7  26.0 - 34.0 pg Final   MCHC 07/01/2023 30.9  30.0 - 36.0 g/dL Final  RDW 07/01/2023 15.8 (H)  11.5 - 15.5 % Final   Platelets 07/01/2023 90 (L)  150 - 400 K/uL Final   Comment: Immature Platelet Fraction may be clinically indicated, consider ordering this additional test [id number] REPEATED TO VERIFY    nRBC 07/01/2023 0.0  0.0 - 0.2 % Final   Neutrophils Relative % 07/01/2023 75  % Final   Neutro Abs 07/01/2023 3.2  1.7 - 7.7 K/uL Final   Lymphocytes Relative 07/01/2023 15  % Final   Lymphs Abs 07/01/2023 0.6 (L)  0.7 - 4.0 K/uL Final   Monocytes Relative 07/01/2023 6  % Final   Monocytes Absolute 07/01/2023 0.3  0.1 - 1.0 K/uL Final   Eosinophils Relative 07/01/2023 3  % Final   Eosinophils Absolute 07/01/2023 0.1  0.0 - 0.5 K/uL Final   Basophils Relative 07/01/2023 1  % Final   Basophils Absolute 07/01/2023 0.0  0.0 - 0.1 K/uL Final   RBC Morphology 07/01/2023 MORPHOLOGY UNREMARKABLE   Final   Immature Granulocytes 07/01/2023 0  % Final   Abs Immature Granulocytes 07/01/2023 0.01  0.00 - 0.07 K/uL Final   Performed at [facility], [address] [place], [place] [address]   ABO/RH(D) 07/01/2023 AB POS   Final   Antibody Screen 07/01/2023 NEG   Final   Sample Expiration 07/01/2023 07/15/2023,2359   Final   Extend sample reason 07/01/2023    Final                   Value:NO TRANSFUSIONS OR PREGNANCY IN THE PAST 3 MONTHS Performed at [facility], [address] [place], [place] [address]   Lab on 06/28/2023  Component Date Value Ref Range Status   Sodium 06/28/2023 145  135 - 145 mEq/L Final   Potassium 06/28/2023 4.9  3.5 - 5.1 mEq/L Final   Chloride 06/28/2023 113 (H)  96 - 112 mEq/L Final   CO2 06/28/2023 23  19 - 32 mEq/L Final   Glucose, Bld 06/28/2023 212 (H)  70 - 99 mg/dL Final   BUN 97/96/7974 30 (H)  6 - 23 mg/dL Final   Creatinine, Ser  06/28/2023 2.36 (H)  0.40 - 1.50 mg/dL Final   GFR 97/96/7974 23.03 (L)  >60.00 mL/min Final   Calculated using the CKD-EPI Creatinine Equation (2021)   Calcium  06/28/2023 8.6  8.4 - 10.5 mg/dL Final   WBC 97/96/7974 3.5 (L)  4.0 - 10.5 K/uL Final   RBC 06/28/2023 3.11 (L)  4.22 - 5.81 Mil/uL Final   Hemoglobin 06/28/2023 9.3 (L)  13.0 - 17.0 g/dL Final   HCT 97/96/7974 28.6 (L)  39.0 - 52.0 % Final   MCV 06/28/2023 92.2  78.0 - 100.0 fl Final   MCHC 06/28/2023 32.5  30.0 - 36.0 g/dL Final   RDW 97/96/7974 17.7 (H)  11.5 - 15.5 % Final   Platelets 06/28/2023 79.0 (L)  150.0 - 400.0 K/uL Final   Neutrophils Relative % 06/28/2023 67.4  43.0 - 77.0 % Final   Lymphocytes Relative 06/28/2023 23.5  12.0 - 46.0 % Final   Monocytes Relative 06/28/2023 5.7  3.0 - 12.0 % Final   Eosinophils Relative 06/28/2023 2.6  0.0 - 5.0 % Final   Basophils Relative 06/28/2023 0.8  0.0 - 3.0 % Final   Neutro Abs 06/28/2023 2.4  1.4 - 7.7 K/uL Final   Lymphs Abs 06/28/2023 0.8  0.7 - 4.0 K/uL Final   Monocytes Absolute 06/28/2023 0.2  0.1 - 1.0 K/uL Final  Eosinophils Absolute 06/28/2023 0.1  0.0 - 0.7 K/uL Final   Basophils Absolute 06/28/2023 0.0  0.0 - 0.1 K/uL Final  Office Visit on 06/17/2023  Component Date Value Ref Range Status   Sodium 06/17/2023 145  135 - 145 mEq/L Final   Potassium 06/17/2023 5.9 No hemolysis seen (H)  3.5 - 5.1 mEq/L Final   Chloride 06/17/2023 116 (H)  96 - 112 mEq/L Final   CO2 06/17/2023 23  19 - 32 mEq/L Final   Glucose, Bld 06/17/2023 104 (H)  70 - 99 mg/dL Final   BUN 98/76/7974 39 (H)  6 - 23 mg/dL Final   Creatinine, Ser 06/17/2023 2.59 (H)  0.40 - 1.50 mg/dL Final   GFR 98/76/7974 20.61 (L)  >60.00 mL/min Final   Calculated using the CKD-EPI Creatinine Equation (2021)   Calcium  06/17/2023 9.3  8.4 - 10.5 mg/dL Final   WBC 98/76/7974 5.4  4.0 - 10.5 K/uL Final   RBC 06/17/2023 3.06 (L)  4.22 - 5.81 Mil/uL Final   Hemoglobin 06/17/2023 9.1 (L)  13.0 - 17.0 g/dL Final    HCT 98/76/7974 28.5 (L)  39.0 - 52.0 % Final   MCV 06/17/2023 93.2  78.0 - 100.0 fl Final   MCHC 06/17/2023 31.9  30.0 - 36.0 g/dL Final   RDW 98/76/7974 17.0 (H)  11.5 - 15.5 % Final   Platelets 06/17/2023 95.0 (L)  150.0 - 400.0 K/uL Final   Neutrophils Relative % 06/17/2023 71.4  43.0 - 77.0 % Final   Lymphocytes Relative 06/17/2023 16.6  12.0 - 46.0 % Final   Monocytes Relative 06/17/2023 7.7  3.0 - 12.0 % Final   Eosinophils Relative 06/17/2023 3.7  0.0 - 5.0 % Final   Basophils Relative 06/17/2023 0.6  0.0 - 3.0 % Final   Neutro Abs 06/17/2023 3.8  1.4 - 7.7 K/uL Final   Lymphs Abs 06/17/2023 0.9  0.7 - 4.0 K/uL Final   Monocytes Absolute 06/17/2023 0.4  0.1 - 1.0 K/uL Final   Eosinophils Absolute 06/17/2023 0.2  0.0 - 0.7 K/uL Final   Basophils Absolute 06/17/2023 0.0  0.0 - 0.1 K/uL Final  Lab on 06/10/2023  Component Date Value Ref Range Status   WBC 06/10/2023 7.0  4.0 - 10.5 K/uL Final   RBC 06/10/2023 2.91 (L)  4.22 - 5.81 Mil/uL Final   Hemoglobin 06/10/2023 8.8 Repeated and verified X2. (L)  13.0 - 17.0 g/dL Final   HCT 98/83/7974 27.6 (L)  39.0 - 52.0 % Final   MCV 06/10/2023 94.6  78.0 - 100.0 fl Final   MCHC 06/10/2023 31.8  30.0 - 36.0 g/dL Final   RDW 98/83/7974 18.6 (H)  11.5 - 15.5 % Final   Platelets 06/10/2023 102.0 (L)  150.0 - 400.0 K/uL Final   Neutrophils Relative % 06/10/2023 66.0  43.0 - 77.0 % Final   Lymphocytes Relative 06/10/2023 25.5  12.0 - 46.0 % Final   Monocytes Relative 06/10/2023 5.4  3.0 - 12.0 % Final   Eosinophils Relative 06/10/2023 2.2  0.0 - 5.0 % Final   Basophils Relative 06/10/2023 0.9  0.0 - 3.0 % Final   Neutro Abs 06/10/2023 4.6  1.4 - 7.7 K/uL Final   Lymphs Abs 06/10/2023 1.8  0.7 - 4.0 K/uL Final   Monocytes Absolute 06/10/2023 0.4  0.1 - 1.0 K/uL Final   Eosinophils Absolute 06/10/2023 0.2  0.0 - 0.7 K/uL Final   Basophils Absolute 06/10/2023 0.1  0.0 - 0.1 K/uL Final   Sodium 06/10/2023 145  135 - 145 mEq/L Final    Potassium 06/10/2023 5.2 No hemolysis seen (H)  3.5 - 5.1 mEq/L Final   Chloride 06/10/2023 116 (H)  96 - 112 mEq/L Final   CO2 06/10/2023 20  19 - 32 mEq/L Final   Glucose, Bld 06/10/2023 216 (H)  70 - 99 mg/dL Final   BUN 98/83/7974 46 (H)  6 - 23 mg/dL Final   Creatinine, Ser 06/10/2023 3.04 (H)  0.40 - 1.50 mg/dL Final   GFR 98/83/7974 17.00 (L)  >60.00 mL/min Final   Calculated using the CKD-EPI Creatinine Equation (2021)   Calcium  06/10/2023 8.8  8.4 - 10.5 mg/dL Final  Lab on 98/91/7974  Component Date Value Ref Range Status   WBC 06/02/2023 6.9  4.0 - 10.5 K/uL Final   RBC 06/02/2023 2.76 (L)  4.22 - 5.81 Mil/uL Final   Hemoglobin 06/02/2023 8.2 (L)  13.0 - 17.0 g/dL Corrected   Rechecked and verified result.   HCT 06/02/2023 25.9 (L)  39.0 - 52.0 % Final   MCV 06/02/2023 93.9  78.0 - 100.0 fl Final   MCHC 06/02/2023 31.6  30.0 - 36.0 g/dL Final   RDW 98/91/7974 16.8 (H)  11.5 - 15.5 % Final   Platelets 06/02/2023 92.0 (L)  150.0 - 400.0 K/uL Final   Neutrophils Relative % 06/02/2023 65.1  43.0 - 77.0 % Final   Lymphocytes Relative 06/02/2023 25.3  12.0 - 46.0 % Final   Monocytes Relative 06/02/2023 6.2  3.0 - 12.0 % Final   Eosinophils Relative 06/02/2023 2.7  0.0 - 5.0 % Final   Basophils Relative 06/02/2023 0.7  0.0 - 3.0 % Final   Neutro Abs 06/02/2023 4.5  1.4 - 7.7 K/uL Final   Lymphs Abs 06/02/2023 1.7  0.7 - 4.0 K/uL Final   Monocytes Absolute 06/02/2023 0.4  0.1 - 1.0 K/uL Final   Eosinophils Absolute 06/02/2023 0.2  0.0 - 0.7 K/uL Final   Basophils Absolute 06/02/2023 0.0  0.0 - 0.1 K/uL Final   Sodium 06/02/2023 146 (H)  135 - 145 mEq/L Final   Potassium 06/02/2023 5.9 No hemolysis seen (H)  3.5 - 5.1 mEq/L Final   Chloride 06/02/2023 118 (H)  96 - 112 mEq/L Final   CO2 06/02/2023 20  19 - 32 mEq/L Final   Glucose, Bld 06/02/2023 243 (H)  70 - 99 mg/dL Final   BUN 98/91/7974 52 (H)  6 - 23 mg/dL Final   Creatinine, Ser 06/02/2023 2.90 (H)  0.40 - 1.50 mg/dL  Final   GFR 98/91/7974 18.00 (L)  >60.00 mL/min Final   Calculated using the CKD-EPI Creatinine Equation (2021)   Calcium  06/02/2023 9.0  8.4 - 10.5 mg/dL Final    ECG: Date: 87/82/7975  Time ECG obtained: 0856 AM Rate: 65 bpm Rhythm:  Atrial sensed ventricular paced rhythm Axis (leads I and aVF): normal Intervals: PR 202 ms. QRS 186 ms. QTc 480 ms. ST segment and T wave changes: No evidence of acute T wave abnormalities or significant ST segment elevation or depression.  Evidence of a possible, age undetermined, prior infarct:  No Comparison: Similar to previous tracing obtained on 08/08/2022   IMAGING / PROCEDURES: TRANSTHORACIC ECHOCARDIOGRAM performed on 12/25/2022 Left ventricular ejection fraction, by estimation, is 60 to 65%. The  left ventricle has normal function. The left ventricle has no regional  wall motion abnormalities. There is mild left ventricular hypertrophy. Left ventricular diastolic parameters are consistent with Grade I diastolic dysfunction (impaired relaxation).  Right ventricular systolic function  is normal. The right ventricular size is normal.  The mitral valve is normal in structure. Mild mitral valve regurgitation. No evidence of mitral stenosis.  The aortic valve is tricuspid. There is mild calcification of the aortic valve. There is mild thickening of the aortic valve. Aortic valve regurgitation is not visualized. Aortic valve sclerosis is present, with no evidence of aortic valve stenosis.  The inferior vena cava is normal in size with greater than 50% respiratory variability, suggesting right atrial pressure of 3 mmHg.   NM BONE SCAN WHOLE BODY performed on 08/12/2022 Uptake within the superior endplate of the L1 vertebral body suggestive of an acute on chronic compression fracture with residual edema. This finding suggests L1 may be amenable to attempted vertebral body cement augmentation as indicated. No definitive abnormal radiotracer uptake involving  the L2 vertebral body, findings suggestive of complete healing.  CT THORACIC SPINE WO CONTRAST performed on 08/10/2022 No acute or recent osseous injury within the thoracic spine. Aortic atherosclerosis  CT LUMBAR SPINE WO CONTRAST performed on 08/10/2022 No acute osseous abnormality within the lumbar spine Mild chronic compression deformities involving the L1 and L2 vertebral bodies, relatively stable as compared to most recent CT from 01/02/2022. Chronic bilateral pars defects at L5 with associated 4 mm spondylolisthesis, with resultant severe right and moderate left L5 foraminal stenosis. Left subarticular disc protrusion at L4-5 with resultant moderate to severe left lateral recess stenosis. Aortic atherosclerosis  Impression and Plan:  Dean Murphy has been referred for pre-anesthesia review and clearance prior to him undergoing the planned anesthetic and procedural courses. Available labs, pertinent testing, and imaging results were personally reviewed by me in preparation for upcoming operative/procedural course. [facility] medical record has been updated following extensive record review and patient interview with PAT staff.   Patient with documented thrombocytopenia.  Platelet count on 06/28/2023 was down to 79 K/uL. This was discussed with Dr. Orion, who indicated plans to transfuse patient with 1 pack of platelets preoperatively.  In efforts to ensure platelets available for transfusion and to avoid in potential delays tomorrow, patient was brought in today (07/01/2023) for preoperative type and screen.  Additionally, MD requested repeat CBC to be done prior to surgery. In review of repeat labs, Hemoglobin had slightly decreased to 9.0 g/dL (previously 9.3 g/dl on 97/96/7974).  Platelet count had actually improved to 90 K/uL.  Reached back out to primary attending surgeon to discuss.  Over the course of the last 1 month, patient's platelet count has ranged between 79 and 102 K/uL.  Surgeon updated. We will proceed with original plan to transfuse 1 pack of platelets prior to undergoing his scheduled procedure tomorrow. Orders are in, and I have communicated with blood back regarding the need for blood product to be on hand and ready for transfusion at 0700 AM on 07/02/2023.  This patient has been appropriately cleared by cardiology (LOW to MODERATE) and by internal.family medicine (ACCEPTABLE) with the individually indicated risks of significant of patient experiencing perioperative complications. Completed perioperative prescription for cardiac device management documentation completed by primary cardiology team and placed on patient's chart for review by the surgical/anesthetic team on the day of his procedure. Electrophysiology indicating that procedure may interfere with planned surgical procedure. Recommendations are to have device placed over device during the procedure. Removal of magnet will revery the device back to its previously programmed settings. Beyond normal perioperative cardiovascular monitoring, and the aforementioned magnet use, there are no recommendations from electrophysiology team that prompt further discussion/recommendations  from electronics engineer.   Based on clinical review performed today (07/01/23), barring any significant acute changes in the patient's overall condition, it is anticipated that he will be able to proceed with the planned surgical intervention. Any acute changes in clinical condition may necessitate his procedure being postponed and/or cancelled. Patient will meet with anesthesia team (MD and/or CRNA) on the day of his procedure for preoperative evaluation/assessment. Questions regarding anesthetic course will be fielded at that time.   Pre-surgical instructions were reviewed with the patient during his PAT appointment, and questions were fielded to satisfaction by PAT clinical staff. He has been instructed on which medications that he  will need to hold prior to surgery, as well as the ones that have been deemed safe/appropriate to take on the day of his procedure. As part of the general education provided by PAT, patient made aware both verbally and in writing, that he would need to abstain from the use of any illegal substances during his perioperative course. He was advised that failure to follow the provided instructions could necessitate case cancellation or result in serious perioperative complications up to and including death. Patient encouraged to contact PAT and/or his surgeon's office to discuss any questions or concerns that may arise prior to surgery; verbalized understanding.   Dorise Pereyra, MSN, APRN, FNP-C, CEN [facility]  Perioperative Services Nurse Practitioner Phone: [phone] Fax: [phone] 07/01/23 2:25 PM  NOTE: This note has been prepared using Dragon dictation software. Despite my best ability to proofread, there is always the potential that unintentional transcriptional errors may still occur from this process.

## 2023-07-01 NOTE — Telephone Encounter (Signed)
 Patient has been advised of Pre-Admission date/time, and Surgery date at [facility].  Surgery Date: 07/02/23 Preadmission Testing Date: 07/01/23 (in person visit @9 :45 am )  Patient has been made aware to call [phone], between 1-3:00pm the day before surgery, to find out what time to arrive for surgery.

## 2023-07-02 ENCOUNTER — Ambulatory Visit
Admission: RE | Admit: 2023-07-02 | Discharge: 2023-07-02 | Disposition: A | Discharge status: Home / Self Care | Payer: Medicare Other | Attending: Surgery | Admitting: Surgery

## 2023-07-02 ENCOUNTER — Encounter
Admission: RE | Disposition: A | Discharge status: Home / Self Care | Payer: Self-pay | Source: Home / Self Care | Attending: Surgery

## 2023-07-02 ENCOUNTER — Ambulatory Visit: Payer: Medicare Other | Admitting: Urgent Care

## 2023-07-02 ENCOUNTER — Other Ambulatory Visit: Payer: Self-pay

## 2023-07-02 ENCOUNTER — Encounter: Payer: Self-pay | Admitting: Surgery

## 2023-07-02 DIAGNOSIS — K219 Gastro-esophageal reflux disease without esophagitis: Secondary | ICD-10-CM | POA: Insufficient documentation

## 2023-07-02 DIAGNOSIS — I5032 Chronic diastolic (congestive) heart failure: Secondary | ICD-10-CM | POA: Insufficient documentation

## 2023-07-02 DIAGNOSIS — D500 Iron deficiency anemia secondary to blood loss (chronic): Secondary | ICD-10-CM | POA: Insufficient documentation

## 2023-07-02 DIAGNOSIS — I6523 Occlusion and stenosis of bilateral carotid arteries: Secondary | ICD-10-CM | POA: Insufficient documentation

## 2023-07-02 DIAGNOSIS — I480 Paroxysmal atrial fibrillation: Secondary | ICD-10-CM | POA: Insufficient documentation

## 2023-07-02 DIAGNOSIS — E1122 Type 2 diabetes mellitus with diabetic chronic kidney disease: Secondary | ICD-10-CM | POA: Insufficient documentation

## 2023-07-02 DIAGNOSIS — E785 Hyperlipidemia, unspecified: Secondary | ICD-10-CM | POA: Insufficient documentation

## 2023-07-02 DIAGNOSIS — Z79899 Other long term (current) drug therapy: Secondary | ICD-10-CM | POA: Insufficient documentation

## 2023-07-02 DIAGNOSIS — E1129 Type 2 diabetes mellitus with other diabetic kidney complication: Secondary | ICD-10-CM

## 2023-07-02 DIAGNOSIS — Z9049 Acquired absence of other specified parts of digestive tract: Secondary | ICD-10-CM | POA: Insufficient documentation

## 2023-07-02 DIAGNOSIS — K4030 Unilateral inguinal hernia, with obstruction, without gangrene, not specified as recurrent: Secondary | ICD-10-CM | POA: Insufficient documentation

## 2023-07-02 DIAGNOSIS — K4090 Unilateral inguinal hernia, without obstruction or gangrene, not specified as recurrent: Secondary | ICD-10-CM

## 2023-07-02 DIAGNOSIS — I252 Old myocardial infarction: Secondary | ICD-10-CM | POA: Insufficient documentation

## 2023-07-02 DIAGNOSIS — I130 Hypertensive heart and chronic kidney disease with heart failure and stage 1 through stage 4 chronic kidney disease, or unspecified chronic kidney disease: Secondary | ICD-10-CM | POA: Insufficient documentation

## 2023-07-02 DIAGNOSIS — I10 Essential (primary) hypertension: Secondary | ICD-10-CM

## 2023-07-02 DIAGNOSIS — Z01812 Encounter for preprocedural laboratory examination: Secondary | ICD-10-CM

## 2023-07-02 DIAGNOSIS — E1151 Type 2 diabetes mellitus with diabetic peripheral angiopathy without gangrene: Secondary | ICD-10-CM | POA: Insufficient documentation

## 2023-07-02 DIAGNOSIS — Z87891 Personal history of nicotine dependence: Secondary | ICD-10-CM | POA: Insufficient documentation

## 2023-07-02 DIAGNOSIS — N184 Chronic kidney disease, stage 4 (severe): Secondary | ICD-10-CM | POA: Insufficient documentation

## 2023-07-02 DIAGNOSIS — I442 Atrioventricular block, complete: Secondary | ICD-10-CM | POA: Insufficient documentation

## 2023-07-02 DIAGNOSIS — D696 Thrombocytopenia, unspecified: Secondary | ICD-10-CM | POA: Diagnosis not present

## 2023-07-02 DIAGNOSIS — R54 Age-related physical debility: Secondary | ICD-10-CM | POA: Insufficient documentation

## 2023-07-02 DIAGNOSIS — Z794 Long term (current) use of insulin: Secondary | ICD-10-CM | POA: Insufficient documentation

## 2023-07-02 DIAGNOSIS — Z951 Presence of aortocoronary bypass graft: Secondary | ICD-10-CM | POA: Insufficient documentation

## 2023-07-02 DIAGNOSIS — I2510 Atherosclerotic heart disease of native coronary artery without angina pectoris: Secondary | ICD-10-CM | POA: Insufficient documentation

## 2023-07-02 DIAGNOSIS — Z8673 Personal history of transient ischemic attack (TIA), and cerebral infarction without residual deficits: Secondary | ICD-10-CM | POA: Insufficient documentation

## 2023-07-02 HISTORY — DX: Disorder of arteries and arterioles, unspecified: I77.9

## 2023-07-02 HISTORY — DX: Thrombocytopenia, unspecified: D69.6

## 2023-07-02 HISTORY — PX: INGUINAL HERNIA REPAIR: SHX194

## 2023-07-02 HISTORY — DX: Essential (primary) hypertension: I10

## 2023-07-02 HISTORY — DX: Chronic prostatitis: N41.1

## 2023-07-02 HISTORY — DX: Repeated falls: R29.6

## 2023-07-02 HISTORY — DX: Peripheral vascular disease, unspecified: I73.9

## 2023-07-02 HISTORY — DX: Other chronic pain: G89.29

## 2023-07-02 HISTORY — DX: Dislocation of jaw, unspecified side, initial encounter: S03.00XA

## 2023-07-02 HISTORY — DX: Other forms of dyspnea: R06.09

## 2023-07-02 HISTORY — DX: Insomnia, unspecified: G47.00

## 2023-07-02 HISTORY — DX: Angiodysplasia of colon without hemorrhage: K55.20

## 2023-07-02 HISTORY — DX: Iron deficiency anemia secondary to blood loss (chronic): D50.0

## 2023-07-02 LAB — GLUCOSE, CAPILLARY
Glucose-Capillary: 111 mg/dL — ABNORMAL HIGH (ref 70–99)
Glucose-Capillary: 129 mg/dL — ABNORMAL HIGH (ref 70–99)

## 2023-07-02 SURGERY — REPAIR, HERNIA, INGUINAL, ADULT
Anesthesia: General | Site: Inguinal | Laterality: Right

## 2023-07-02 MED ORDER — BUPIVACAINE LIPOSOME 1.3 % IJ SUSP
INTRAMUSCULAR | Status: AC
  Filled 2023-07-02: qty 20

## 2023-07-02 MED ORDER — ACETAMINOPHEN 500 MG PO TABS
ORAL_TABLET | ORAL | Status: AC
  Filled 2023-07-02: qty 2

## 2023-07-02 MED ORDER — SODIUM CHLORIDE 0.9 % IV SOLN
INTRAVENOUS | Status: DC
Start: 2023-07-02 — End: 2023-07-02

## 2023-07-02 MED ORDER — CHLORHEXIDINE GLUCONATE CLOTH 2 % EX PADS: 6.0000 | MEDICATED_PAD | Freq: Once | CUTANEOUS | Status: DC

## 2023-07-02 MED ORDER — DEXAMETHASONE SODIUM PHOSPHATE 10 MG/ML IJ SOLN
INTRAMUSCULAR | Status: AC
  Filled 2023-07-02: qty 1

## 2023-07-02 MED ORDER — SODIUM CHLORIDE 0.9% IV SOLUTION: Freq: Once | INTRAVENOUS | Status: DC

## 2023-07-02 MED ORDER — ONDANSETRON HCL 4 MG/2ML IJ SOLN
INTRAMUSCULAR | Status: DC | PRN
  Administered 2023-07-02: 4 mg via INTRAVENOUS

## 2023-07-02 MED ORDER — ORAL CARE MOUTH RINSE: 15.0000 mL | Freq: Once | OROMUCOSAL | Status: AC

## 2023-07-02 MED ORDER — HYDROCODONE-ACETAMINOPHEN 5-325 MG PO TABS: 1.0000 | ORAL_TABLET | Freq: Four times a day (QID) | ORAL | 0 refills | Status: DC | PRN

## 2023-07-02 MED ORDER — CHLORHEXIDINE GLUCONATE 0.12 % MT SOLN
OROMUCOSAL | Status: AC
  Filled 2023-07-02: qty 15

## 2023-07-02 MED ORDER — EPHEDRINE 5 MG/ML INJ
INTRAVENOUS | Status: AC
  Filled 2023-07-02: qty 5

## 2023-07-02 MED ORDER — BUPIVACAINE-EPINEPHRINE (PF) 0.5% -1:200000 IJ SOLN
INTRAMUSCULAR | Status: AC
  Filled 2023-07-02: qty 30

## 2023-07-02 MED ORDER — LIDOCAINE HCL (PF) 2 % IJ SOLN
INTRAMUSCULAR | Status: AC
  Filled 2023-07-02: qty 5

## 2023-07-02 MED ORDER — CHLORHEXIDINE GLUCONATE 0.12 % MT SOLN
15.0000 mL | Freq: Once | OROMUCOSAL | Status: AC
  Administered 2023-07-02: 15 mL via OROMUCOSAL

## 2023-07-02 MED ORDER — SUGAMMADEX SODIUM 200 MG/2ML IV SOLN
INTRAVENOUS | Status: DC | PRN
  Administered 2023-07-02: 200 mg via INTRAVENOUS

## 2023-07-02 MED ORDER — CEFAZOLIN SODIUM-DEXTROSE 2-4 GM/100ML-% IV SOLN
2.0000 g | INTRAVENOUS | Status: AC
  Administered 2023-07-02: 2 g via INTRAVENOUS

## 2023-07-02 MED ORDER — PHENYLEPHRINE HCL-NACL 20-0.9 MG/250ML-% IV SOLN
INTRAVENOUS | Status: DC | PRN
  Administered 2023-07-02: 40 ug/min via INTRAVENOUS

## 2023-07-02 MED ORDER — LIDOCAINE HCL (PF) 2 % IJ SOLN
INTRAMUSCULAR | Status: DC | PRN
  Administered 2023-07-02: 40 mg via INTRADERMAL

## 2023-07-02 MED ORDER — OXYCODONE HCL 5 MG PO TABS
5.0000 mg | ORAL_TABLET | Freq: Once | ORAL | Status: AC
  Administered 2023-07-02: 5 mg via ORAL

## 2023-07-02 MED ORDER — FENTANYL CITRATE (PF) 100 MCG/2ML IJ SOLN
INTRAMUSCULAR | Status: DC | PRN
  Administered 2023-07-02: 25 ug via INTRAVENOUS

## 2023-07-02 MED ORDER — PHENYLEPHRINE 80 MCG/ML (10ML) SYRINGE FOR IV PUSH (FOR BLOOD PRESSURE SUPPORT)
PREFILLED_SYRINGE | INTRAVENOUS | Status: AC
  Filled 2023-07-02: qty 10

## 2023-07-02 MED ORDER — FENTANYL CITRATE (PF) 100 MCG/2ML IJ SOLN: 25.0000 ug | INTRAMUSCULAR | Status: DC | PRN

## 2023-07-02 MED ORDER — PROPOFOL 1000 MG/100ML IV EMUL
INTRAVENOUS | Status: AC
  Filled 2023-07-02: qty 100

## 2023-07-02 MED ORDER — GABAPENTIN 300 MG PO CAPS
300.0000 mg | ORAL_CAPSULE | ORAL | Status: AC
  Administered 2023-07-02: 300 mg via ORAL

## 2023-07-02 MED ORDER — PROPOFOL 500 MG/50ML IV EMUL
INTRAVENOUS | Status: DC | PRN
  Administered 2023-07-02: 150 ug/kg/min via INTRAVENOUS

## 2023-07-02 MED ORDER — ROCURONIUM BROMIDE 10 MG/ML (PF) SYRINGE
PREFILLED_SYRINGE | INTRAVENOUS | Status: DC | PRN
  Administered 2023-07-02: 50 mg via INTRAVENOUS

## 2023-07-02 MED ORDER — 0.9 % SODIUM CHLORIDE (POUR BTL) OPTIME
TOPICAL | Status: DC | PRN
  Administered 2023-07-02: 500 mL

## 2023-07-02 MED ORDER — ACETAMINOPHEN 500 MG PO TABS
1000.0000 mg | ORAL_TABLET | ORAL | Status: AC
  Administered 2023-07-02: 1000 mg via ORAL

## 2023-07-02 MED ORDER — OXYCODONE HCL 5 MG PO TABS
ORAL_TABLET | ORAL | Status: AC
  Filled 2023-07-02: qty 1

## 2023-07-02 MED ORDER — DEXAMETHASONE SODIUM PHOSPHATE 10 MG/ML IJ SOLN
INTRAMUSCULAR | Status: DC | PRN
  Administered 2023-07-02: 4 mg via INTRAVENOUS

## 2023-07-02 MED ORDER — GABAPENTIN 300 MG PO CAPS
ORAL_CAPSULE | ORAL | Status: AC
  Filled 2023-07-02: qty 1

## 2023-07-02 MED ORDER — ONDANSETRON HCL 4 MG/2ML IJ SOLN
INTRAMUSCULAR | Status: AC
  Filled 2023-07-02: qty 2

## 2023-07-02 MED ORDER — BUPIVACAINE-EPINEPHRINE (PF) 0.25% -1:200000 IJ SOLN
INTRAMUSCULAR | Status: AC
  Filled 2023-07-02: qty 30

## 2023-07-02 MED ORDER — EPHEDRINE SULFATE-NACL 50-0.9 MG/10ML-% IV SOSY
PREFILLED_SYRINGE | INTRAVENOUS | Status: DC | PRN
  Administered 2023-07-02 (×2): 10 mg via INTRAVENOUS

## 2023-07-02 MED ORDER — CEFAZOLIN SODIUM-DEXTROSE 2-4 GM/100ML-% IV SOLN
INTRAVENOUS | Status: AC
  Filled 2023-07-02: qty 100

## 2023-07-02 MED ORDER — PROPOFOL 10 MG/ML IV BOLUS
INTRAVENOUS | Status: AC
  Filled 2023-07-02: qty 20

## 2023-07-02 MED ORDER — FENTANYL CITRATE (PF) 100 MCG/2ML IJ SOLN
INTRAMUSCULAR | Status: AC
  Filled 2023-07-02: qty 2

## 2023-07-02 MED ORDER — PROPOFOL 10 MG/ML IV BOLUS
INTRAVENOUS | Status: DC | PRN
  Administered 2023-07-02: 100 mg via INTRAVENOUS

## 2023-07-02 MED ORDER — BUPIVACAINE-EPINEPHRINE (PF) 0.25% -1:200000 IJ SOLN
INTRAMUSCULAR | Status: DC | PRN
  Administered 2023-07-02: 50 mL via INTRAMUSCULAR

## 2023-07-02 SURGICAL SUPPLY — 30 items
BLADE CLIPPER SURG (BLADE) ×1 IMPLANT
CHLORAPREP W/TINT 26 (MISCELLANEOUS) ×1 IMPLANT
DERMABOND ADVANCED .7 DNX12 (GAUZE/BANDAGES/DRESSINGS) ×1 IMPLANT
DRAIN PENROSE 0.625X18 (DRAIN) ×1 IMPLANT
DRAPE INCISE IOBAN 66X45 STRL (DRAPES) ×1 IMPLANT
DRAPE LAPAROTOMY 77X122 PED (DRAPES) ×1 IMPLANT
ELECT CAUTERY BLADE 6.4 (BLADE) ×1 IMPLANT
ELECT REM PT RETURN 9FT ADLT (ELECTROSURGICAL) ×1
ELECTRODE REM PT RTRN 9FT ADLT (ELECTROSURGICAL) ×1 IMPLANT
GAUZE 4X4 16PLY ~~LOC~~+RFID DBL (SPONGE) ×1 IMPLANT
GLOVE BIO SURGEON STRL SZ7 (GLOVE) ×1 IMPLANT
GOWN STRL REUS W/ TWL LRG LVL3 (GOWN DISPOSABLE) ×2 IMPLANT
MANIFOLD NEPTUNE II (INSTRUMENTS) ×1 IMPLANT
MESH SYNTHETIC 1.8X4 KEYHOLE S (Mesh General) IMPLANT
NDL HYPO 22X1.5 SAFETY MO (MISCELLANEOUS) ×1 IMPLANT
NEEDLE HYPO 22X1.5 SAFETY MO (MISCELLANEOUS) ×1
NS IRRIG 1000ML POUR BTL (IV SOLUTION) ×1 IMPLANT
PACK BASIN MINOR ARMC (MISCELLANEOUS) ×1 IMPLANT
SPONGE KITTNER 5P (MISCELLANEOUS) ×1 IMPLANT
SPONGE T-LAP 18X18 ~~LOC~~+RFID (SPONGE) ×1 IMPLANT
SUT ETHIBOND NAB MO 7 #0 18IN (SUTURE) ×2 IMPLANT
SUT MNCRL AB 4-0 PS2 18 (SUTURE) ×1 IMPLANT
SUT SILK 2 0SH CR/8 30 (SUTURE) IMPLANT
SUT SILK 2-0 18XBRD TIE 12 (SUTURE) IMPLANT
SUT VIC AB 2-0 SH 27XBRD (SUTURE) IMPLANT
SUT VIC AB 3-0 54X BRD REEL (SUTURE) ×1 IMPLANT
SUT VIC AB 3-0 SH 27X BRD (SUTURE) ×1 IMPLANT
SYR 20ML LL LF (SYRINGE) ×1 IMPLANT
TRAP FLUID SMOKE EVACUATOR (MISCELLANEOUS) ×1 IMPLANT
WATER STERILE IRR 500ML POUR (IV SOLUTION) ×1 IMPLANT

## 2023-07-02 NOTE — Op Note (Signed)
 Open right Inguinal Hernia Repair with Bard Mesh Pre-Shaped with Keyhole, 1.8 in x 4 in (4.5cm x 10cm)      Pre-operative Diagnosis:  Right giant Inguinal Hernia, thrombocytopenia   Post-operative Diagnosis: Same   Surgeon: Laneta Luna, MD FACS   Anesthesia: Gen. with endotracheal tube   Findings: chronically incarcerated pantaloon right inguinal hernia        Procedure Details  The patient was seen again in the Holding Room. The benefits, complications, treatment options, and expected outcomes were discussed with the patient. The risks of bleeding, infection, recurrence of symptoms, failure to resolve symptoms, recurrence of hernia, ischemic orchitis, chronic pain syndrome or neuroma, were discussed again. The likelihood of improving the patient's symptoms with return to their baseline status is good.  The patient and/or family concurred with the proposed plan, giving informed consent.  The patient was taken to Operating Room, identified  and the procedure verified as  Inguinal Hernia Repair. Laterality confirmed.  A Time Out was held and the above information confirmed.   Prior to the induction of general anesthesia, antibiotic prophylaxis was administered. VTE prophylaxis was in place. General endotracheal anesthesia was then administered and tolerated well. After the induction, the abdomen was prepped with Chloraprep and draped in the sterile fashion. The patient was positioned in the supine position.   Did receive platelets before he was intubated and during the surgery given significant thrombocytopenia, advanced age and frailty.  Right inguinal incision was created with a 15 blade knife and electrocautery was used to dissect through the subcutaneous tissue.  The external bleak was dissected and incised.  Please note that the whole floor as well as the roof of the inguinal canal was distorted and very attenuated.  The always was very thin and we incised all the way to the external inguinal  ring.  The ilioinguinal nerve was identified and cauterized given that this giant hernias will have a lot of neuromas and postoperative pain.  I did not want to create more pain on this already debilitated male. We were able to encircle the cord structures in the standard fashion.  Once again his anatomy had significant distortion and the tissues were very frail and thin.  I was able to finally restore the  anatomy and identified a very large pantaloon hernia. I was Able to reduce the sac into the abdominal cavity.  Please note that the hernia was chronically incarcerated. In order to reinforce the floor of the inguinal canal I was able to approximate the conjoined tendon to the shelving edge of the inguinal ligament.I used a  Bard Mesh Pre-Shaped with Keyhole,  (4.5cm x 10cm) perform Lichtenstein repair by fixing the mesh to the shelving edge of the inguinal ligament laterally and to the conjoined tendon medially.  Also make sure that the cord structures were protected at all times as well as the vas deferens. I was able to close the external oblique fascia with 2-0 Vicryl in a running fashion.  Using Exparel  a field block was performed under direct visualization.  The subcutaneous tissue was approximated using 3-0 Vicryl 4-0 subcuticular Monocryl was used at all skin edges. Dermabond was placed.  Patient tolerated the procedure well. There were no complications. He was taken to the recovery room in stable condition.                 Laneta Luna, MD, FACS

## 2023-07-02 NOTE — Transfer of Care (Signed)
 Immediate Anesthesia Transfer of Care Note  Patient: Dean Murphy  Procedure(s) Performed: HERNIA REPAIR INGUINAL ADULT, open, RNFA to assist (Right: Inguinal)  Patient Location: PACU  Anesthesia Type:General  Level of Consciousness: drowsy  Airway & Oxygen Therapy: Patient Spontanous Breathing and Patient connected to face mask oxygen  Post-op Assessment: Report given to RN and Post -op Vital signs reviewed and stable  Post vital signs: Reviewed and stable  Last Vitals:  Vitals Value Taken Time  BP 114/45 07/02/23 1104  Temp    Pulse 55 07/02/23 1107  Resp 9 07/02/23 1107  SpO2 99 % 07/02/23 1107  Vitals shown include unfiled device data.  Last Pain:  Vitals:   07/02/23 0818  TempSrc: Temporal  PainSc:          Complications: No notable events documented.

## 2023-07-02 NOTE — Anesthesia Procedure Notes (Signed)
 Procedure Name: Intubation Date/Time: 07/02/2023 9:06 AM  Performed by: Brandy Almarie BROCKS, CRNAPre-anesthesia Checklist: Patient identified, Emergency Drugs available, Suction available and Patient being monitored Patient Re-evaluated:Patient Re-evaluated prior to induction Oxygen Delivery Method: Circle system utilized Preoxygenation: Pre-oxygenation with 100% oxygen Induction Type: IV induction Ventilation: Oral airway inserted - appropriate to patient size and Two handed mask ventilation required Laryngoscope Size: 4 and McGrath Grade View: Grade I Tube type: Oral Tube size: 7.0 mm Number of attempts: 1 Airway Equipment and Method: Stylet and Video-laryngoscopy Placement Confirmation: ETT inserted through vocal cords under direct vision, positive ETCO2 and breath sounds checked- equal and bilateral Secured at: 24 cm Tube secured with: Tape Dental Injury: Teeth and Oropharynx as per pre-operative assessment  Comments: Elective mcgrath

## 2023-07-02 NOTE — Interval H&P Note (Signed)
 History and Physical Interval Note:  07/02/2023 8:45 AM  Dean Murphy  has presented today for surgery, with the diagnosis of inguinal hernia initial reducible.  The various methods of treatment have been discussed with the patient and family. After consideration of risks, benefits and other options for treatment, the patient has consented to  Procedure(s): HERNIA REPAIR INGUINAL ADULT, open, RNFA to assist (Right) as a surgical intervention.  The patient's history has been reviewed, patient examined, no change in status, stable for surgery.  I have reviewed the patient's chart and labs.  Questions were answered to the patient's satisfaction.     Braxden Lovering F Oleta Gunnoe

## 2023-07-02 NOTE — Anesthesia Preprocedure Evaluation (Signed)
 Anesthesia Evaluation  Patient identified by MRN, date of birth, ID band Patient awake    Reviewed: Allergy & Precautions, H&P , NPO status , Patient's Chart, lab work & pertinent test results, reviewed documented beta blocker date and time   History of Anesthesia Complications (+) PONV and history of anesthetic complications  Airway Mallampati: II  TM Distance: >3 FB Neck ROM: full    Dental  (+) Dental Advidsory Given, Caps, Missing, Teeth Intact   Pulmonary neg pulmonary ROS, former smoker   Pulmonary exam normal breath sounds clear to auscultation       Cardiovascular Exercise Tolerance: Good hypertension, (-) angina + CAD, + Past MI, + CABG (5 vessel), + Peripheral Vascular Disease and + DOE  (-) Cardiac Stents Normal cardiovascular exam+ dysrhythmias (Complete Heart Block) + pacemaker (-) Valvular Problems/Murmurs Rhythm:regular Rate:Normal     Neuro/Psych negative neurological ROS  negative psych ROS   GI/Hepatic Neg liver ROS, hiatal hernia, PUD,GERD  ,,  Endo/Other  diabetes    Renal/GU CRFRenal disease  negative genitourinary   Musculoskeletal   Abdominal   Peds  Hematology  (+) Blood dyscrasia, anemia   Anesthesia Other Findings Past Medical History: 07/2014: (HFpEF) heart failure with preserved ejection fraction (HCC)     Comment:  a.) initially Dx'd in 07/2014 in setting of CHB; b.) TTE              09/02/2017: EF 55-60%, apical septal HK, G1DD, sev LAE,               mild MR, PASP 41; c.) TTE 12/25/2022: EF 60-65%, mild               LVH, G1DD, mild AoV sclerosis with no stenosis No date: Anemia due to chronic blood loss No date: Aortic atherosclerosis (HCC) No date: Arthritis No date: AVM (arteriovenous malformation) of colon     Comment:  a.)colonoscopy 08/10/2022: angioectasias (AVM) x 2 -->               Tx'd with APC No date: Bilateral carotid artery disease (HCC)     Comment:  a.) s/p  BILATERAL carotid endarterectomies: RIGHT               (12/31/2010) and LEFT (02/09/2011); b.) carotid doppler               05/13/2023: 1-39% BICA with disturbed flow from the RIGHT              subclavian. No date: Blindness of right eye No date: CAD (coronary artery disease)     Comment:  a.) s/p 5v CABG 1989; b.) MV 01/02/2011: EF 75%, no               ischemia No date: Cerebral microvascular disease 07/2014: CHB (complete heart block) (HCC)     Comment:  a.) s/p St. Jude Assurity dual chamber PPM 08/13/2014 No date: Chronic BILATERAL L5 pars interarticularis defect 2015: Chronic kidney disease, stage IV (severe) (HCC) No date: Chronic prostatitis No date: Chronic radicular lumbar pain No date: Colon polyps No date: Complication of anesthesia     Comment:  a.) PONV No date: Constipation No date: DDD (degenerative disc disease), thoracolumbar No date: Diverticulosis No date: DOE (dyspnea on exertion) No date: Dupuytren's disease of finger and palm of right hand No date: Essential hypertension No date: GERD (gastroesophageal reflux disease) No date: Gout No date: Hemorrhoids No date: Hiatal hernia No date: History of bilateral cataract extraction No  date: HTN (hypertension) No date: Hyperlipidemia No date: Insomnia 01/01/2011: Lacunar infarction (CVA)     Comment:  a,) noted on MRI brain 01/01/2011: remote lacunar               infarcts of LEFT cerebellum and basal ganglia BILATERALLY No date: Myelolipoma of left adrenal gland 1976: Myocardial infarction [facility])     Comment:  a.) type/details unknown 08/11/2014: NSTEMI (non-ST elevated myocardial infarction) (HCC)     Comment:  a.) troponins were trended: 0.35 --> 0.54 --> 0.54 ng/mL No date: Orthostasis No date: Osteoporosis 07/2014: PAF (paroxysmal atrial fibrillation) (HCC)     Comment:  a.) CHA2DS2-VASc = 8 (age x2, sex, HFpEF, HTN, CVA x2,               vascular disease, T2DM) as of 07/01/2023; b.) cardiac                rate/rhythm maintained on oral metoprolol  succinate; no               OAC No date: PONV (postoperative nausea and vomiting) No date: Positive ANA (antinuclear antibody)     Comment:  a.) workup with rheumatology (-) for SLE 08/13/2014: Presence of permanent cardiac pacemaker     Comment:  a.) s/p St. Jude Assurity dual chamber PPM 08/13/2014 to              LEFT infraclavicular deltopectoral region No date: PVD (peripheral vascular disease) (HCC) No date: Recurrent falls No date: Right inguinal hernia 1989: S/P CABG x 5 No date: Scoliosis No date: Symptomatic bradycardia     Comment:  a.) s/p St. Jude Assurity dual chamber PPM 08/13/2014 No date: Thoracic compression fracture (L1-L2)     Comment:  a.) s/p L1 balloon kyphoplasty 08/13/2022 No date: Thrombocytopenia (HCC) No date: TMJ (dislocation of temporomandibular joint) No date: Type 2 diabetes mellitus treated with insulin  (HCC)   Reproductive/Obstetrics negative OB ROS                             Anesthesia Physical Anesthesia Plan  ASA: 3  Anesthesia Plan: General   Post-op Pain Management:    Induction: Intravenous  PONV Risk Score and Plan: 3 and Propofol  infusion, TIVA, Treatment may vary due to age or medical condition, Ondansetron  and Dexamethasone   Airway Management Planned: Oral ETT  Additional Equipment:   Intra-op Plan:   Post-operative Plan: Extubation in OR  Informed Consent: I have reviewed the patients History and Physical, chart, labs and discussed the procedure including the risks, benefits and alternatives for the proposed anesthesia with the patient or authorized representative who has indicated his/her understanding and acceptance.     Dental Advisory Given  Plan Discussed with: Anesthesiologist, CRNA and Surgeon  Anesthesia Plan Comments:         Anesthesia Quick Evaluation

## 2023-07-02 NOTE — Discharge Instructions (Addendum)
 Inguinal Hernia Repair, Adult, Care After The following information offers guidance on how to care for yourself after your procedure. Your health care provider may also give you more specific instructions. If you have problems or questions, contact your health care provider. What can I expect after the procedure? After the procedure, it is common to have: Pain. Swelling and bruising around the incision area. Scrotal swelling, in males. Some fluid or blood draining from your incisions. Follow these instructions at home: Medicines Take over-the-counter and prescription medicines only as told by your health care provider. Ask your health care provider if the medicine prescribed to you: Requires you to avoid driving or using machinery. Can cause constipation. You may need to take these actions to prevent or treat constipation: Drink enough fluid to keep your urine pale yellow. Take over-the-counter or prescription medicines. Eat foods that are high in fiber, such as beans, whole grains, and fresh fruits and vegetables. Limit foods that are high in fat and processed sugars, such as fried or sweet foods. Incision care  Follow instructions from your health care provider about how to take care of your incisions. Make sure you: Wash your hands with soap and water for at least 20 seconds before and after you change your bandage (dressing). If soap and water are not available, use hand sanitizer. Change your dressing as told by your health care provider. Leave stitches (sutures), skin glue, or adhesive strips in place. These skin closures may need to stay in place for 2 weeks or longer. If adhesive strip edges start to loosen and curl up, you may trim the loose edges. Do not remove adhesive strips completely unless your health care provider tells you to do that. Check your incision area every day for signs of infection. Check for: More redness, swelling, or pain. More fluid or blood. Warmth. Pus or  a bad smell. Wear loose, soft clothing while your incisions heal. Managing pain and swelling  If directed, put ice on the painful or swollen areas. To do this: Put ice in a plastic bag. Place a towel between your skin and the bag. Leave the ice on for 20 minutes, 2-3 times a day. Remove the ice if your skin turns bright red. This is very important. If you cannot feel pain, heat, or cold, you have a greater risk of damage to the area.   Activity Do not lift anything that is heavier than 10 lb (4.5 kg), or the limit that you are told, until your health care provider says that it is safe. Ask your health care provider what activities are safe for you. A lot of activity during the first week after surgery can increase pain and swelling. For 1 week after your procedure: Avoid activities that take a lot of effort, such as exercise or sports. You may walk and climb stairs as needed for daily activity, but avoid long walks or climbing stairs for exercise. General instructions If you were given a sedative during the procedure, it can affect you for several hours. Do not drive or operate machinery until your health care provider says that it is safe. Do not take baths, swim, or use a hot tub until your health care provider approves. Ask your health care provider if you may take showers. You may only be allowed to take sponge baths. Do not use any products that contain nicotine or tobacco. These products include cigarettes, chewing tobacco, and vaping devices, such as e-cigarettes. If you need help quitting, ask your  health care provider. Keep all follow-up visits. This is important. Contact a health care provider if: You have any of these signs of infection: More redness, swelling, or pain around your incisions or your groin area. More fluid or blood coming from an incision. Warmth coming from an incision. Pus or a bad smell coming from an incision. A fever or chills. You have more swelling in your  scrotum, if you are male. You have severe pain and medicines do not help. You have abdominal pain or swelling. You cannot urinate or have a bowel movement. You faint or feel dizzy. You have nausea and vomiting. Get help right away if: You have redness, warmth, or pain in your leg. You have chest pain. You have problems breathing. These symptoms may represent a serious problem that is an emergency. Do not wait to see if the symptoms will go away. Get medical help right away. Call your local emergency services (911 in the [place] Do not drive yourself to the hospital. Summary Pain, swelling, and bruising are common after the procedure. Check your incision area every day for signs of infection, such as more redness, swelling, or pain. Put ice on painful or swollen areas for 20 minutes, 2-3 times a day. This information is not intended to replace advice given to you by your health care provider. Make sure you discuss any questions you have with your health care provider. Document Revised: 01/09/2020 Document Reviewed: 01/09/2020 Elsevier Patient Education  2024 [employer].

## 2023-07-03 LAB — BPAM PLATELET PHERESIS
Blood Product Expiration Date: 202502082359
ISSUE DATE / TIME: 202502070753
Unit Type and Rh: 6200

## 2023-07-03 LAB — PREPARE PLATELET PHERESIS: Unit division: 0

## 2023-07-05 ENCOUNTER — Encounter: Payer: Self-pay | Admitting: Surgery

## 2023-07-07 NOTE — Anesthesia Postprocedure Evaluation (Signed)
 Anesthesia Post Note  Patient: RONIEL HALLORAN  Procedure(s) Performed: HERNIA REPAIR INGUINAL ADULT, open, RNFA to assist (Right: Inguinal)  Patient location during evaluation: PACU Anesthesia Type: General Level of consciousness: awake and alert Pain management: pain level controlled Vital Signs Assessment: post-procedure vital signs reviewed and stable Respiratory status: spontaneous breathing, nonlabored ventilation, respiratory function stable and patient connected to nasal cannula oxygen Cardiovascular status: blood pressure returned to baseline and stable Postop Assessment: no apparent nausea or vomiting Anesthetic complications: no   No notable events documented.   Last Vitals:  Vitals:   07/02/23 1204 07/02/23 1343  BP: 137/60 (!) 141/52  Pulse: 64 60  Resp: 18 18  Temp: (!) 36.2 C   SpO2: 98% 95%    Last Pain:  Vitals:   07/02/23 1343  TempSrc:   PainSc: 3                  Prentice Murphy

## 2023-07-14 ENCOUNTER — Encounter: Payer: Medicare Other | Admitting: Surgery

## 2023-07-19 ENCOUNTER — Encounter: Payer: Self-pay | Admitting: Surgery

## 2023-07-19 ENCOUNTER — Other Ambulatory Visit: Payer: Self-pay | Admitting: Internal Medicine

## 2023-07-19 ENCOUNTER — Telehealth: Payer: Self-pay | Admitting: Internal Medicine

## 2023-07-19 ENCOUNTER — Ambulatory Visit (INDEPENDENT_AMBULATORY_CARE_PROVIDER_SITE_OTHER): Payer: Medicare Other | Admitting: Surgery

## 2023-07-19 VITALS — BP 160/73 | HR 95 | Temp 98.3°F | Ht 71.0 in | Wt 183.0 lb

## 2023-07-19 DIAGNOSIS — T8454XD Infection and inflammatory reaction due to internal left knee prosthesis, subsequent encounter: Secondary | ICD-10-CM

## 2023-07-19 DIAGNOSIS — T8131XA Disruption of external operation (surgical) wound, not elsewhere classified, initial encounter: Secondary | ICD-10-CM

## 2023-07-19 DIAGNOSIS — K4090 Unilateral inguinal hernia, without obstruction or gangrene, not specified as recurrent: Secondary | ICD-10-CM

## 2023-07-19 DIAGNOSIS — Z09 Encounter for follow-up examination after completed treatment for conditions other than malignant neoplasm: Secondary | ICD-10-CM

## 2023-07-19 DIAGNOSIS — K4030 Unilateral inguinal hernia, with obstruction, without gangrene, not specified as recurrent: Secondary | ICD-10-CM

## 2023-07-19 MED ORDER — SULFAMETHOXAZOLE-TRIMETHOPRIM 800-160 MG PO TABS: 1.0000 | ORAL_TABLET | Freq: Two times a day (BID) | ORAL | 0 refills | Status: AC

## 2023-07-19 MED ORDER — HYDROCODONE-ACETAMINOPHEN 5-325 MG PO TABS
1.0000 | ORAL_TABLET | Freq: Four times a day (QID) | ORAL | 0 refills | Status: DC | PRN
Start: 2023-07-19 — End: 2023-08-11

## 2023-07-19 MED ORDER — AMOXICILLIN 500 MG PO CAPS
500.0000 mg | ORAL_CAPSULE | Freq: Every day | ORAL | 0 refills | Status: DC
Start: 2023-07-19 — End: 2023-08-27

## 2023-07-19 NOTE — Progress Notes (Signed)
 Outpatient Surgical Follow Up  07/19/2023  Dean Murphy is an 89 y.o. male.   Chief Complaint  Patient presents with   Routine Post Op    HPI: History: Dean Murphy Is well-known to me with prior history of right inguinal hernia 2 weeks ago.  For the last week now he reports some drainage from the right wound.  He is uncircumcised and has had issues with pain.  He has not shower for couple weeks now.  He states that because of his foreskin every time he pees it is like a showerhead and there is some urine that has gotten into the right groin.  Has have significant pain and he lost his prescription.  Please note that I wholeheartedly believe him as he is 53 and has been struggling with his body.  His mind is pristine but clearly his body is not He has been very stoic and has declined help from the son and the daughter-in-law.  Has been taking Tylenol only for the pain.  He denies any fevers and chills.  He does report swelling. Fact that he is diabetic, chf,  does not help with wound healing and propensity for infections    Past Medical History:  Diagnosis Date   (HFpEF) heart failure with preserved ejection fraction (HCC) 07/2014   a.) initially Dx'd in 07/2014 in setting of CHB; b.) TTE 09/02/2017: EF 55-60%, apical septal HK, G1DD, sev LAE, mild MR, PASP 41; c.) TTE 12/25/2022: EF 60-65%, mild LVH, G1DD, mild AoV sclerosis with no stenosis   Anemia due to chronic blood loss    Aortic atherosclerosis (HCC)    Arthritis    AVM (arteriovenous malformation) of colon    a.)colonoscopy 08/10/2022: angioectasias (AVM) x 2 --> Tx'd with APC   Bilateral carotid artery disease (HCC)    a.) s/p BILATERAL carotid endarterectomies: RIGHT (12/31/2010) and LEFT (02/09/2011); b.) carotid doppler 05/13/2023: 1-39% BICA with disturbed flow from the RIGHT subclavian.   Blindness of right eye    CAD (coronary artery disease)    a.) s/p 5v CABG 1989; b.) MV 01/02/2011: EF 75%, no ischemia   Cerebral  microvascular disease    CHB (complete heart block) (HCC) 07/2014   a.) s/p St. Jude Assurity dual chamber PPM 08/13/2014   Chronic BILATERAL L5 pars interarticularis defect    Chronic kidney disease, stage IV (severe) (HCC) 2015   Chronic prostatitis    Chronic radicular lumbar pain    Colon polyps    Complication of anesthesia    a.) PONV   Constipation    DDD (degenerative disc disease), thoracolumbar    Diverticulosis    DOE (dyspnea on exertion)    Dupuytren's disease of finger and palm of right hand    Essential hypertension    GERD (gastroesophageal reflux disease)    Gout    Hemorrhoids    Hiatal hernia    History of bilateral cataract extraction    HTN (hypertension)    Hyperlipidemia    Insomnia    Lacunar infarction (CVA) 01/01/2011   a,) noted on MRI brain 01/01/2011: remote lacunar infarcts of LEFT cerebellum and basal ganglia BILATERALLY   Myelolipoma of left adrenal gland    Myocardial infarction (HCC) 1976   a.) type/details unknown   NSTEMI (non-ST elevated myocardial infarction) (HCC) 08/11/2014   a.) troponins were trended: 0.35 --> 0.54 --> 0.54 ng/mL   Orthostasis    Osteoporosis    PAF (paroxysmal atrial fibrillation) (HCC) 07/2014   a.) CHA2DS2-VASc =  48 (age x2, sex, HFpEF, HTN, CVA x2, vascular disease, T2DM) as of 07/01/2023; b.) cardiac rate/rhythm maintained on oral metoprolol succinate; no OAC   PONV (postoperative nausea and vomiting)    Positive ANA (antinuclear antibody)    a.) workup with rheumatology (-) for SLE   Presence of permanent cardiac pacemaker 08/13/2014   a.) s/p St. Jude Assurity dual chamber PPM 08/13/2014 to LEFT infraclavicular deltopectoral region   PVD (peripheral vascular disease) (HCC)    Recurrent falls    Right inguinal hernia    S/P CABG x 5 1989   Scoliosis    Symptomatic bradycardia    a.) s/p St. Jude Assurity dual chamber PPM 08/13/2014   Thoracic compression fracture (L1-L2)    a.) s/p L1 balloon kyphoplasty  08/13/2022   Thrombocytopenia (HCC)    TMJ (dislocation of temporomandibular joint)    Type 2 diabetes mellitus treated with insulin [facility])     Past Surgical History:  Procedure Laterality Date   CARDIAC CATHETERIZATION  08/11/2014   Procedure: TEMPORARY PACEMAKER;  Surgeon: Runell Gess, MD;  Location[facility] CATH LAB;  Service: Cardiovascular;;   CAROTID ENDARTERECTOMY Right 12/31/2010   CAROTID ENDARTERECTOMY Left 02/09/2011   CATARACT EXTRACTION W/ INTRAOCULAR LENS IMPLANT Bilateral    CHOLECYSTECTOMY     COLONOSCOPY WITH PROPOFOL N/A 08/10/2022   Procedure: COLONOSCOPY WITH PROPOFOL;  Surgeon: Napoleon Form, MD;  Location: [facility] ENDOSCOPY;  Service: Gastroenterology;  Laterality: N/A;   CORONARY ARTERY BYPASS GRAFT  05/26/1987   x 5 vessels   ENDOSCOPIC RETROGRADE CHOLANGIOPANCREATOGRAPHY (ERCP) WITH PROPOFOL N/A 01/27/2022   Procedure: ENDOSCOPIC RETROGRADE CHOLANGIOPANCREATOGRAPHY (ERCP) WITH PROPOFOL;  Surgeon: Meryl Dare, MD;  Location: [facility] ENDOSCOPY;  Service: Gastroenterology;  Laterality: N/A;   ESOPHAGOGASTRODUODENOSCOPY N/A 08/09/2022   Procedure: ESOPHAGOGASTRODUODENOSCOPY (EGD);  Surgeon: Sherrilyn Rist, MD;  Location: [facility] ENDOSCOPY;  Service: Gastroenterology;  Laterality: N/A;   HOT HEMOSTASIS N/A 08/10/2022   Procedure: HOT HEMOSTASIS (ARGON PLASMA COAGULATION/BICAP);  Surgeon: Napoleon Form, MD;  Location: [facility] ENDOSCOPY;  Service: Gastroenterology;  Laterality: N/A;   I & D KNEE WITH POLY EXCHANGE Left 07/29/2015   Procedure: IRRIGATION AND DEBRIDEMENT LEFT KNEE WITH POLY EXCHANGE;  Surgeon: Samson Frederic, MD;  Location: [facility] OR;  Service: Orthopedics;  Laterality: Left;   INGUINAL HERNIA REPAIR Right 07/02/2023   Procedure: HERNIA REPAIR INGUINAL ADULT, open, RNFA to assist;  Surgeon: Leafy Ro, MD;  Location: [facility] ORS;  Service: General;  Laterality: Right;   IR GENERIC HISTORICAL  03/04/2016   IR US GUIDE VASC ACCESS RIGHT 03/04/2016 Jolaine Click,  MD [facility]-INTER[facility] RAD   IR GENERIC HISTORICAL  03/04/2016   IR FLUORO GUIDE CV LINE RIGHT 03/04/2016 Jolaine Click, MD [facility]   IR KYPHO LUMBAR INC FX REDUCE BONE BX UNI/BIL CANNULATION INC/IMAGING  08/13/2022   PERMANENT PACEMAKER INSERTION N/A 08/13/2014   STJ dual chamber pacemaker implanted by Dr Ladona Ridgel for CHB   REMOVAL OF STONES  01/27/2022   Procedure: REMOVAL OF STONES;  Surgeon: Meryl Dare, MD;  Location: [facility] ENDOS[facility];  Service: Gastroenterology;;   Dennison Mascot  01/27/2022   Procedure: Dennison Mascot;  Surgeon: Meryl Dare, MD;  Location: [facility] ENDOSCOPY;  Service: Gastroenterology;;   TONSILLECTOMY     TOTAL KNEE ARTHROPLASTY Bilateral     Family History  Problem Relation Age of Onset   Heart disease Mother        Before age 29   Diabetes Mother    Varicose Veins Mother  Heart attack Mother    Heart attack Father    Heart disease Father        After age 38   Hypertension Father    Heart disease Brother        Before age 101   Hypertension Brother    Heart attack Brother    Diabetes Son    Hypertension Son    Hypertension Son    Diabetes Son    Diabetes Other    Cancer Other    Colon cancer Neg Hx     Social History:  reports that he quit smoking about 50 years ago. His smoking use included cigarettes. He started smoking about 75 years ago. He has a 25 pack-year smoking history. He has been exposed to tobacco smoke. He has never used smokeless tobacco. He reports current alcohol use. He reports that he does not use drugs.  Allergies:  Allergies  Allergen Reactions   Zoledronic Acid Other (See Comments)    Discontinued in 2015 due to progressive decrease in renal function (CKD-III/IV)   Nsaids Other (See Comments)    Held due to creatinine elevation.      Medications reviewed.    ROS Full ROS performed and is otherwise negative other than what is stated in HPI   BP (!) 160/73   Pulse 95   Temp 98.3 F (36.8 C)   Ht 5\' 11"  (1.803 m)    Wt 183 lb (83 kg)   SpO2 97%   BMI 25.52 kg/m   Physical Exam  Debilitated ZOX:WRUE, there is a superficial dehiscence of the right wound.  I was able to debride some of the edges.  There is poor hygiene in the genital as well as the scrotal area.  I was able to irrigate the wound and I was able to prescribe his penis and the scrotum to provide appropriate hygiene.  I redressed the right area.  I do not see any evidence of mesh exposed  Assessment/Plan: 89 year old male with dehiscence of wound.  Very difficult situations as he has struggled for the last couple weeks. I think that urine in the wound certainly serous as a nidus of infection.  DisCussed about meticulous hygiene and daily showering Will start antibiotic therapy and I will see him back in a couple of days.  Discussed with them about dressing changes and appropriate hygiene on a daily basis. We will give him a refill on the hydrocodone as he is still having pain.  I do not actually think that he needs surgical intervention or admission but we will keep an eye close . He was a little bit embarrassed of not admitting that he needed help as he has been independent.  He now recognizes his limitations Daughter in Social worker and wife present and are very appreciative  Sterling Big, MD [facility] General Surgeon

## 2023-07-19 NOTE — Patient Instructions (Addendum)
 We have sent in a prescription for Bactrim to your pharmacy.   Change your dressing daily and follow up here on Wednesday.  You need to change his dressing three times a day.  We will send in an order for Home Health to help with dressing changes.   Wound Care, Adult Taking care of your wound properly can help to prevent pain, infection, and scarring. It can also help your wound heal more quickly. Follow instructions from your health care provider about how to care for your wound. Supplies needed: Soap and water. Wound cleanser, saline, or germ-free (sterile) water. Gauze. If needed, a clean bandage (dressing) or other type of wound dressing material to cover or place in the wound. Follow your health care provider's instructions about what dressing supplies to use. Cream or topical ointment to apply to the wound, if told by your health care provider. How to care for your wound Cleaning the wound Ask your health care provider how to clean the wound. This may include: Using mild soap and water, a wound cleanser, saline, or sterile water. Using a clean gauze to pat the wound dry after cleaning it. Do not rub or scrub the wound. Dressing care Wash your hands with soap and water for at least 20 seconds before and after you change the dressing. If soap and water are not available, use hand sanitizer. Change your dressing as told by your health care provider. This may include: Cleaning or rinsing out (irrigating) the wound. Application of cream or topical ointment, if told by your health care provider. Placing a dressing over the wound or in the wound (packing). Covering the wound with an outer dressing. Leave stitches (sutures), staples, skin glue, or adhesive strips in place. These skin closures may need to stay in place for 2 weeks or longer. If adhesive strip edges start to loosen and curl up, you may trim the loose edges. Do not remove adhesive strips completely unless your health care  provider tells you to do that. Ask your health care provider when you can leave the wound uncovered. Checking for infection Check your wound area every day for signs of infection. Check for: More redness, swelling, or pain. Fluid or blood. Warmth. Pus or a bad smell.  Follow these instructions at home Medicines If you were prescribed an antibiotic medicine, cream, or ointment, take or apply it as told by your health care provider. Do not stop using the antibiotic even if your condition improves. If you were prescribed pain medicine, take it 30 minutes before you do any wound care or as told by your health care provider. Take over-the-counter and prescription medicines only as told by your health care provider. Eating and drinking Eat a diet that includes protein, vitamin A, vitamin C, and other nutrient-rich foods to help the wound heal. Foods rich in protein include meat, fish, eggs, dairy, beans, and nuts. Foods rich in vitamin A include carrots and dark green, leafy vegetables. Foods rich in vitamin C include citrus fruits, tomatoes, broccoli, and peppers. Drink enough fluid to keep your urine pale yellow. General instructions Do not take baths, swim, or use a hot tub until your health care provider approves. Ask your health care provider if you may take showers. You may only be allowed to take sponge baths. Do not scratch or pick at the wound. Keep it covered as told by your health care provider. Return to your normal activities as told by your health care provider. Ask your health care  provider what activities are safe for you. Protect your wound from the sun when you are outside for the first 6 months, or for as long as told by your health care provider. Cover up the scar area or apply sunscreen that has an SPF of at least 30. Do not use any products that contain nicotine or tobacco. These products include cigarettes, chewing tobacco, and vaping devices, such as e-cigarettes. If you  need help quitting, ask your health care provider. Keep all follow-up visits. This is important. Contact a health care provider if: You received a tetanus shot and you have swelling, severe pain, redness, or bleeding at the injection site. Your pain is not controlled with medicine. You have any of these signs of infection: More redness, swelling, or pain around the wound. Fluid or blood coming from the wound. Warmth coming from the wound. A fever or chills. You are nauseous or you vomit. You are dizzy. You have a new rash or hardness around the wound. Get help right away if: You have a red streak of skin near the area around your wound. Pus or a bad smell coming from the wound. Your wound has been closed with staples, sutures, skin glue, or adhesive strips and it begins to open up and separate. Your wound is bleeding, and the bleeding does not stop with gentle pressure. These symptoms may represent a serious problem that is an emergency. Do not wait to see if the symptoms will go away. Get medical help right away. Call your local emergency services (911 in the [place] Do not drive yourself to the hospital. Summary Always wash your hands with soap and water for at least 20 seconds before and after changing your dressing. Change your dressing as told by your health care provider. To help with healing, eat foods that are rich in protein, vitamin A, vitamin C, and other nutrients. Check your wound every day for signs of infection. Contact your health care provider if you think that your wound is infected. This information is not intended to replace advice given to you by your health care provider. Make sure you discuss any questions you have with your health care provider. Document Revised: 09/17/2020 Document Reviewed: 09/17/2020 Elsevier Patient Education  2024 [employer].

## 2023-07-19 NOTE — Telephone Encounter (Signed)
 Dean Murphy called to cancel his upcoming appointment. He wanted Dr. Luciana Axe to know he was recently released from the hospital due to an operation and is unable to make his appointment. He stated he is doing ok and will call back to reschedule when he is better. Patient can be reached at [phone].

## 2023-07-20 ENCOUNTER — Ambulatory Visit: Payer: Medicare Other | Admitting: Internal Medicine

## 2023-07-21 ENCOUNTER — Encounter: Payer: Self-pay | Admitting: Surgery

## 2023-07-21 ENCOUNTER — Ambulatory Visit: Payer: Medicare Other | Admitting: Internal Medicine

## 2023-07-21 ENCOUNTER — Encounter: Payer: Medicare Other | Admitting: Surgery

## 2023-07-21 ENCOUNTER — Ambulatory Visit (INDEPENDENT_AMBULATORY_CARE_PROVIDER_SITE_OTHER): Payer: Medicare Other | Admitting: Surgery

## 2023-07-21 VITALS — BP 109/55 | HR 70 | Temp 98.2°F | Ht 71.0 in | Wt 183.0 lb

## 2023-07-21 DIAGNOSIS — Z09 Encounter for follow-up examination after completed treatment for conditions other than malignant neoplasm: Secondary | ICD-10-CM

## 2023-07-21 DIAGNOSIS — K4030 Unilateral inguinal hernia, with obstruction, without gangrene, not specified as recurrent: Secondary | ICD-10-CM

## 2023-07-21 NOTE — Progress Notes (Signed)
 [name] is following from a right inguinal hernia repair.  He did have a dehiscence of the wound.  Now he is doing better hygienic care.  Feels better.  PE NAD For fascial dehiscence of right inguinal wound.  There is some fibrinous exudate.  This was cleaned.  Dry dressing applied.  No evidence of mesh exposure.  Significant improvement.  A/P dehiscence of wound currently improving.  Continue appropriate hygiene antibiotics and wound care. No need for urgent surgical intervention or further hospitalization

## 2023-07-21 NOTE — Patient Instructions (Signed)
 Continue to change the dressing 2-3 times a day as needed. Keep the area clean as best you can.   Call with any questions or problems. Let us know if you develop a fever, spreading redness of the wound, or any thick drainage.  Follow up here next week.

## 2023-07-27 ENCOUNTER — Ambulatory Visit: Payer: Medicare Other

## 2023-07-28 ENCOUNTER — Encounter: Payer: Self-pay | Admitting: Surgery

## 2023-07-28 ENCOUNTER — Ambulatory Visit (INDEPENDENT_AMBULATORY_CARE_PROVIDER_SITE_OTHER): Payer: Medicare Other | Admitting: Surgery

## 2023-07-28 VITALS — BP 150/56 | HR 72 | Temp 98.2°F | Ht 71.0 in | Wt 180.0 lb

## 2023-07-28 DIAGNOSIS — K4030 Unilateral inguinal hernia, with obstruction, without gangrene, not specified as recurrent: Secondary | ICD-10-CM

## 2023-07-28 DIAGNOSIS — Z09 Encounter for follow-up examination after completed treatment for conditions other than malignant neoplasm: Secondary | ICD-10-CM

## 2023-07-28 NOTE — Patient Instructions (Signed)
 Change the area daily. Wash with soap and water and rinse the area well daily.   Follow up here in 2-3 weeks.     Please call and ask to speak with a nurse if you develop questions or concerns.

## 2023-07-28 NOTE — Progress Notes (Signed)
 Mr. Amadon is following from a right inguinal hernia repair.  He did have a dehiscence of the wound.  Now he is doing better w appropriate hygienic care.  Feels better.   PE NAD For fascial dehiscence of right inguinal wound.  There is no evidence of necrotizing infection, is healing by secondary intention. Dry dressing applied.  No evidence of mesh exposure.  Significant improvement.   A/P dehiscence of wound currently improving.  Continue appropriate hygiene antibiotics and wound care. No need for urgent surgical intervention or further hospitalization RTC 2-3 weeks

## 2023-07-29 ENCOUNTER — Ambulatory Visit: Payer: Self-pay | Admitting: Family Medicine

## 2023-07-29 ENCOUNTER — Encounter: Payer: Self-pay | Admitting: Internal Medicine

## 2023-07-29 ENCOUNTER — Telehealth: Payer: Self-pay

## 2023-07-29 LAB — CUP PACEART REMOTE DEVICE CHECK
Battery Remaining Longevity: 8 mo
Battery Remaining Percentage: 11 %
Battery Voltage: 2.84 V
Brady Statistic AP VP Percent: 16 %
Brady Statistic AP VS Percent: 1 %
Brady Statistic AS VP Percent: 84 %
Brady Statistic AS VS Percent: 1 %
Brady Statistic RA Percent Paced: 15 %
Brady Statistic RV Percent Paced: 99 %
Date Time Interrogation Session: 20250304020535
Implantable Lead Connection Status: 753985
Implantable Lead Connection Status: 753985
Implantable Lead Implant Date: 20160321
Implantable Lead Implant Date: 20160321
Implantable Lead Location: 753859
Implantable Lead Location: 753860
Implantable Pulse Generator Implant Date: 20160321
Lead Channel Impedance Value: 300 Ohm
Lead Channel Impedance Value: 350 Ohm
Lead Channel Pacing Threshold Amplitude: 1 V
Lead Channel Pacing Threshold Amplitude: 1 V
Lead Channel Pacing Threshold Pulse Width: 0.5 ms
Lead Channel Pacing Threshold Pulse Width: 0.8 ms
Lead Channel Sensing Intrinsic Amplitude: 11 mV
Lead Channel Sensing Intrinsic Amplitude: 2.9 mV
Lead Channel Setting Pacing Amplitude: 2.5 V
Lead Channel Setting Pacing Amplitude: 3 V
Lead Channel Setting Pacing Pulse Width: 0.5 ms
Lead Channel Setting Sensing Sensitivity: 8 mV
Pulse Gen Model: 2240
Pulse Gen Serial Number: 7734710

## 2023-07-29 NOTE — Telephone Encounter (Signed)
 He has moderate thrombocytopenia and has required transfusions for anemia.  At this point, I do not think the relatively low risk of embolic events justifies risk of additional blood loss with anticoagulation.  Will just continue to monitor his A-fib burden.

## 2023-07-29 NOTE — Telephone Encounter (Signed)
 Morrie Sheldon RN with [facility] called and transferred Shanda Howells [facility] nurse who is with pt now due to wound care. Florentina Addison said that pt has not cked BS or taken Lantus since 07/02/23 reasoning is has not gotten back into routine. At 10:36AM today BS was 426. Pt has already taken lantus 46 units this morning. Pt is asymptomatic and pt will get back to taking BS and Lantus. Pt does not have transportation to come into office due to son is at the beach and daughter in law is working. Dr Para March said would not start back at 46 units of Lantus and gave following instructions; on 07/30/23 pt to take Lantus 20 units and pt is to add 2 units of lantus daily. Pt should restart taking BS and pt to cb next wk with update on Sugars, how much lantus pt is taking and any symptoms. UC & ED precautions given and Katie voiced understanding and will relay to pt. Florentina Addison is writing instructions down for pt now. Sending note to Dr Para March as Lorain Childes.

## 2023-07-29 NOTE — Telephone Encounter (Signed)
 Patient said he doesn't think he is taking any blood thinners (Eliquis).  Currently recovering from hernia surgery.  Doing well.

## 2023-07-29 NOTE — Telephone Encounter (Signed)
 Noted. Thanks.  Please get update on patient and sugar readings next week if patient doesn't call back.  Thanks.

## 2023-07-29 NOTE — Telephone Encounter (Signed)
 Scheduled remote reviewed. Normal device function.   Battery estimated 7.66mo 21 AMS EGM's, AF, longest duration 3hrs , overall controlled rates.  Unclear if pt currently taking Eliquis, not on MAR - route to triage for clarification 1 noise reversion, brief ventricular lead noise, known   Flagging for Dr. Royann Shivers to review. Does not appear patient is on OAC due to recent hx of blood in stool.(Per Dr. Para March 06/17/23 progress note). I have LM for patient to call back and confirm if he is taking or not.    Also, patient nearing ERI, have placed on monthly battery checks.

## 2023-07-29 NOTE — Telephone Encounter (Signed)
  Chief Complaint: high blood sugar Symptoms: asymptomatic Frequency: Stopped taking insulin 07/02/23 after surgery Pertinent Negatives: Patient denies all symptoms Disposition: [] ED /[] Urgent Care (no appt availability in office) / [] Appointment(In office/virtual)/ []  [facility]/ [] Home Care/ [] Refused Recommended Disposition /[] [facility]/ [x]  Follow-up with PCP Additional Notes: Nurse Katie with [facility] calls to report that during her home visit for patient's wound, she had the patient check his blood sugar and it was 465, during call came down to 426. She states patient has not been taking his Lantus since surgery on 07/02/23- she states patient is asymptomatic at this time. She states patient report he has just not gotten "back into the swing of taking it". Per protocol, contact PCP now. Called [facility], spoke with Artelia Laroche, RN, connected caller with nurse during warm transfer. Alerting PCP for review.   Copied from CRM [id number]. Topic: Clinical - Red Word Triage >> Jul 29, 2023 10:31 AM Almira Coaster wrote: Red Word that prompted transfer to Nurse Triage: Florentina Addison with [facility] is calling because they are with the patient currently and he recently had surgery on 07/02/2023 since returning home he has not checked his blood sugar levels and has not been taking his Lantus. When they checked his levels today it was 465. They state patient is not feeling symptoms. Reason for Disposition  Blood glucose > 400 mg/dL (91.4 mmol/L)  Answer Assessment - Initial Assessment Questions 1. BLOOD GLUCOSE: "What is your blood glucose level?"      465, 426 during call 2. ONSET: "When did you check the blood glucose?"     Around 1030 3. USUAL RANGE: "What is your glucose level usually?" (e.g., usual fasting morning value, usual evening value)     Normally 60-140 but has not been taking insulin since surgery 07/02/23 4. KETONES: "Do you check for ketones (urine or blood test  strips)?" If Yes, ask: "What does the test show now?"      Unable to check 5. TYPE 1 or 2:  "Do you know what type of diabetes you have?"  (e.g., Type 1, Type 2, Gestational; doesn't know)      Type 2 6. INSULIN: "Do you take insulin?" "What type of insulin(s) do you use? What is the mode of delivery? (syringe, pen; injection or pump)?"      Lantus 7. DIABETES PILLS: "Do you take any pills for your diabetes?" If Yes, ask: "Have you missed taking any pills recently?"     Denies 8. OTHER SYMPTOMS: "Do you have any symptoms?" (e.g., fever, frequent urination, difficulty breathing, dizziness, weakness, vomiting)     Asymptomatic  Protocols used: Diabetes - High Blood Sugar-A-AH

## 2023-07-29 NOTE — Telephone Encounter (Signed)
 Looks like his Eliquis was stopped in December by Dr. Para March. His burden of atrial fibrillation remains very very low.  He is only had a handful of hours of A-fib in the last year.  Let's not rush to put him back on anticoagulation.

## 2023-07-30 NOTE — Telephone Encounter (Signed)
 Noted. Thanks.

## 2023-08-03 NOTE — Telephone Encounter (Signed)
Agree.  Thanks.  Will await update.

## 2023-08-03 NOTE — Telephone Encounter (Signed)
 Reached out to patient and he states this morning his reading was 139. I advised him to continue to keep record and let us know something around Thursday.

## 2023-08-04 ENCOUNTER — Telehealth: Payer: Self-pay

## 2023-08-04 NOTE — Telephone Encounter (Signed)
 Order given to Amy.  Correction he is doing 38 units and not 48.

## 2023-08-04 NOTE — Telephone Encounter (Signed)
 Noted.   I still wouldn't inc his insulin yet until he has more readings at his current dose.  Thanks.

## 2023-08-04 NOTE — Telephone Encounter (Signed)
 Please give the order.  Thanks.   I thought he was going to restart Lantus at 20 units per day and in the meantime was going to add 2 units of lantus daily.   Please see what he has been taking for his lantus dose.  If his fasting sugar was down to 145 recently/some of the time, I wouldn't increase the dose more as of yet.    Thanks.

## 2023-08-04 NOTE — Telephone Encounter (Signed)
 Received incoming call from Amy with [facility] [phone]. Verbal orders: She is requesting physical therapy evaluation. Patient had 2 falls this week one on Monday and one today. He did not hit his be he has a skin tear. She will send orders for wound care. She will do alignate and foam dressing. His sugar today 3 hours after eating a sausage and egg biscuit was 364 so he is increasing his insulin today to 48. After fasting in the mornings on Saturday 145, Sunday 438, Monday 150. She will like for him to check later in the day as well to get a better read.

## 2023-08-05 NOTE — Telephone Encounter (Signed)
 Called and advised Amy with [facility] of Dr. Armanda Heritage message. She stated patient is doing 36 units daily, patient will check his sugars more so we can having more readings.

## 2023-08-11 ENCOUNTER — Ambulatory Visit (INDEPENDENT_AMBULATORY_CARE_PROVIDER_SITE_OTHER): Admitting: Physician Assistant

## 2023-08-11 ENCOUNTER — Encounter: Payer: Self-pay | Admitting: Physician Assistant

## 2023-08-11 VITALS — BP 132/66 | HR 111 | Temp 97.9°F | Ht 71.0 in | Wt 173.0 lb

## 2023-08-11 DIAGNOSIS — Z09 Encounter for follow-up examination after completed treatment for conditions other than malignant neoplasm: Secondary | ICD-10-CM

## 2023-08-11 DIAGNOSIS — K4030 Unilateral inguinal hernia, with obstruction, without gangrene, not specified as recurrent: Secondary | ICD-10-CM

## 2023-08-11 DIAGNOSIS — T8131XA Disruption of external operation (surgical) wound, not elsewhere classified, initial encounter: Secondary | ICD-10-CM

## 2023-08-11 NOTE — Patient Instructions (Signed)

## 2023-08-11 NOTE — Progress Notes (Signed)
 [facility] POST-OP OFFICE VISIT  08/11/2023  HPI: Dean Murphy is a 89 y.o. male s/p open right inguinal hernia repair on 02/07 which was complicated by wound dehiscence  Since last visit on 03/05, he reports that he has been doing much better. He continues to utilize good bathing/soaps and superficial gauze dressings. He denied any pain. No fever/chills. Scant serous drainage present on gauze. Otherwise, no complaints.   Vital signs: BP 132/66   Pulse (!) 111   Temp 97.9 F (36.6 C) (Oral)   Ht 5\' 11"  (1.803 m)   Wt 173 lb (78.5 kg)   SpO2 96%   BMI 24.13 kg/m    Physical Exam: Constitutional: Well appearing male, NAD Skin: Right inguinal incision is healing via secondary intention, this has closed centrally. In the medial corner this is still healing via secondary intention, wound bed is 100% granulation tissue, scant serous drainage, no erythema. The lateral aspect is also still closing via secondary intention, wound bed is 100% granulation tissue, no drainage   Assessment/Plan: This is a 89 y.o. male s/p open right inguinal hernia repair on 02/07 which was complicated by wound dehiscence   - No need for further Abx  - No need for further pain control   - Reviewed wound care recommendation; Continue superficial dressings daily, okay to shower   - I will see him in ~2 weeks for another wound check; He, and his family at bedside, understand to call with questions/concerns  -- Lynden Oxford, PA-C [facility] 08/11/2023, 10:36 AM M-F: 7am - 4pm

## 2023-08-25 ENCOUNTER — Encounter: Payer: Self-pay | Admitting: Physician Assistant

## 2023-08-25 ENCOUNTER — Telehealth: Payer: Self-pay

## 2023-08-25 ENCOUNTER — Ambulatory Visit (INDEPENDENT_AMBULATORY_CARE_PROVIDER_SITE_OTHER): Admitting: Physician Assistant

## 2023-08-25 VITALS — BP 175/63 | HR 78 | Temp 97.7°F | Ht 71.0 in | Wt 178.6 lb

## 2023-08-25 DIAGNOSIS — K4090 Unilateral inguinal hernia, without obstruction or gangrene, not specified as recurrent: Secondary | ICD-10-CM

## 2023-08-25 DIAGNOSIS — Z09 Encounter for follow-up examination after completed treatment for conditions other than malignant neoplasm: Secondary | ICD-10-CM | POA: Diagnosis not present

## 2023-08-25 DIAGNOSIS — K4030 Unilateral inguinal hernia, with obstruction, without gangrene, not specified as recurrent: Secondary | ICD-10-CM

## 2023-08-25 DIAGNOSIS — T8131XA Disruption of external operation (surgical) wound, not elsewhere classified, initial encounter: Secondary | ICD-10-CM | POA: Diagnosis not present

## 2023-08-25 NOTE — Telephone Encounter (Signed)
 Returned call from patient. Appointment scheduled for 4/22 at 10am

## 2023-08-25 NOTE — Telephone Encounter (Signed)
 Copied from CRM [id number]. Topic: Clinical - Home Health Verbal Orders >> Aug 24, 2023  4:51 PM Sim Boast F wrote: Caller/Agency: Amy from [facility]  Callback Number: [phone] Service Requested: Physical Therapy Frequency: Needs new order for PT because patient refused previously  Any new concerns about the patient? Patient had fall on Saturday, didn't hit his head, lost his balance . He's taking 38 units of insulin. Blood sugars great between 112-150.

## 2023-08-25 NOTE — Patient Instructions (Signed)

## 2023-08-25 NOTE — Telephone Encounter (Signed)
 Please give the order.    Please see about seeing up OV here.  Labs at the visit.  Doesn't need to fast.    Thanks.

## 2023-08-25 NOTE — Progress Notes (Signed)
 [facility] POST-OP OFFICE VISIT  08/25/2023  HPI: Dean Murphy is a 89 y.o. male s/p open right inguinal hernia repair on 02/07 which was complicated by wound dehiscence   He continues to do well Still with scant fibrinous drainage, mainly from medial portion No fever, chills Otherwise doing extremely well  Vital signs: BP (!) 175/63   Pulse 78   Temp 97.7 F (36.5 C) (Oral)   Ht 5\' 11"  (1.803 m)   Wt 178 lb 9.6 oz (81 kg)   SpO2 95%   BMI 24.91 kg/m    Physical Exam: Constitutional: Well appearing male, NAD Skin: Right inguinal incision now about 90% healed. There is a small (~1 cm) portion to the medial corner which needs to close, wound bed is 100% granulation tissue, no erythema. There remainder of the wound is completely closed   Assessment/Plan: This is a 89 y.o. male s/p open right inguinal hernia repair on 02/07 which was complicated by wound dehiscence    - I did apply silver nitrate to medial corner of the wound to facilitate scarring and hopefully get drainage to cease   - Reviewed wound care recommendation; Continue superficial dressings daily, okay to shower              - I will see him in ~2 weeks for another wound check. He, and his family at bedside, understand to call with questions/concerns  -- Lynden Oxford, PA-C [facility] 08/25/2023, 2:26 PM M-F: 7am - 4pm

## 2023-08-25 NOTE — Telephone Encounter (Signed)
 Verbal orders given to Amy. Also called patient left message for him to call and schedule an appointment to see Dr. Para March sometime this month and he will have non fasting labs at visit.

## 2023-08-26 NOTE — Progress Notes (Unsigned)
 HPI: Dean Murphy is a 89 y.o. male who presents to the [facility] pharmacy clinic for antimicrobial suppression follow-up.  Patient Active Problem List   Diagnosis Date Noted   Paresthesia 06/18/2023   DOE (dyspnea on exertion) 12/05/2022   Bowel incontinence 08/23/2022   Skin lesion 08/23/2022   Adenomatous polyp of ascending colon 08/10/2022   Adenomatous polyp of sigmoid colon 08/10/2022   AVM (arteriovenous malformation) of colon, acquired 08/10/2022   Heme positive stool 08/09/2022   Anemia due to chronic blood loss 08/09/2022   GI bleed 08/07/2022   Melena 08/05/2022   Abnormal CT of the abdomen    Biliary obstruction    Elevated LFTs    Inguinal hernia 01/26/2022   Alkaline phosphatase elevation 12/21/2021   Compression fracture of L1 lumbar vertebra (HCC) 12/21/2021   Recurrent falls 07/17/2021   Chronic diastolic heart failure (HCC) 08/05/2020   Fall at home 04/14/2020   Healthcare maintenance 07/21/2018   Advance care planning 07/21/2018   Constipation    Low back pain 02/02/2018   Infection of prosthetic left knee joint (HCC) 07/29/2015   Coronary artery disease 07/29/2015   History of CVA (cerebrovascular accident) 12/24/2014   Double vision 12/23/2014   Pacemaker 11/16/2014   Anemia 09/27/2014   Gastrointestinal hemorrhage associated with peptic ulcer 09/07/2014   Paroxysmal atrial fibrillation (HCC) 08/13/2014   Chest pain 08/11/2014   Complete heart block (HCC) 08/11/2014   S/P CABG x 5 1989. Low risk Myoview 2012 08/11/2014   Syncope 08/11/2014   Cough 06/14/2014   Left foot pain 02/23/2014   Gouty arthropathy 08/09/2013   Chronic radicular lumbar pain 12/15/2012   PVD- s/p bilat CEA- CA dopplers OK Jan 2015 10/06/2012   Insomnia 09/30/2011   OTHER SPECIFIED SITES OF SPRAINS AND STRAINS 04/25/2010   DIVERTICULITIS OF COLON 02/21/2010   TEMPOROMANDIBULAR JOINT DISORDER 11/22/2009   DM (diabetes mellitus), type 2 with renal complications (HCC) 10/01/2009    Chronic kidney disease, stage III (moderate) (HCC) 07/26/2008   CHRONIC PROSTATITIS 07/26/2008   ADVEF, DRUG/MEDICINAL/BIOLOGICAL SUBST NOS 03/16/2007   Dyslipidemia 11/15/2006   Essential hypertension 11/15/2006   GERD 11/15/2006   Osteoarthritis 11/15/2006    Patient's Medications  New Prescriptions   No medications on file  Previous Medications   AMLODIPINE (NORVASC) 5 MG TABLET    TAKE 1 TABLET (5 MG TOTAL) BY MOUTH DAILY.   AMOXICILLIN (AMOXIL) 500 MG CAPSULE    Take 1 capsule (500 mg total) by mouth daily.   B-D UF III MINI PEN NEEDLES 31G X 5 MM MISC    USE DAILY WITH INSULIN PEN   CHOLECALCIFEROL (VITAMIN D3) 25 MCG (1000 UNIT) TABLET    Take 1,000 Units by mouth daily.   CLOTRIMAZOLE-BETAMETHASONE (LOTRISONE) CREAM    APPLY TOPICALLY 2 TIMES DAILY AS NEEDED.   CYANOCOBALAMIN (VITAMIN B12) 1000 MCG TABLET    Take 1 tablet (1,000 mcg total) by mouth daily.   DORZOLAMIDE-TIMOLOL (COSOPT) 22.3-6.8 MG/ML OPHTHALMIC SOLUTION    Place 1 drop into both eyes daily in the afternoon. In both eye   INSULIN GLARGINE (LANTUS SOLOSTAR) 100 UNIT/ML SOLOSTAR PEN    Inject 46 Units into the skin daily.   IRON, FERROUS SULFATE, 325 (65 FE) MG TABS    Take 325 mg by mouth 3 (three) times a week.   LOSARTAN (COZAAR) 100 MG TABLET    Take 100 mg by mouth at bedtime.   METOPROLOL SUCCINATE (TOPROL XL) 25 MG 24 HR TABLET  Take 1 tablet (25 mg total) by mouth at bedtime.   MULTIPLE VITAMINS-MINERALS (PRESERVISION AREDS 2) CAPS    Take 1 capsule by mouth daily.   PANTOPRAZOLE (PROTONIX) 40 MG TABLET    TAKE 1 TABLET BY MOUTH TWICE A DAY   POLYETHYLENE GLYCOL POWDER (GLYCOLAX/MIRALAX) 17 GM/SCOOP POWDER    Take 17 g by mouth daily as needed.   ROSUVASTATIN (CRESTOR) 20 MG TABLET    Take 1 tablet (20 mg total) by mouth daily.   TORSEMIDE (DEMADEX) 10 MG TABLET    Take 1 tablet (10 mg total) by mouth 2 (two) times a week. Monday and Friday   VITAMIN C (ASCORBIC ACID) 250 MG TABLET    Take 250 mg by  mouth 3 (three) times a week. M-W-F  Modified Medications   No medications on file  Discontinued Medications   No medications on file    Allergies: Allergies  Allergen Reactions   Zoledronic Acid Other (See Comments)    Discontinued in 2015 due to progressive decrease in renal function (CKD-III/IV)   Nsaids Other (See Comments)    Held due to creatinine elevation.      Past Medical History: Past Medical History:  Diagnosis Date   (HFpEF) heart failure with preserved ejection fraction (HCC) 07/2014   a.) initially Dx'd in 07/2014 in setting of CHB; b.) TTE 09/02/2017: EF 55-60%, apical septal HK, G1DD, sev LAE, mild MR, PASP 41; c.) TTE 12/25/2022: EF 60-65%, mild LVH, G1DD, mild AoV sclerosis with no stenosis   Anemia due to chronic blood loss    Aortic atherosclerosis (HCC)    Arthritis    AVM (arteriovenous malformation) of colon    a.)colonoscopy 08/10/2022: angioectasias (AVM) x 2 --> Tx'd with APC   Bilateral carotid artery disease (HCC)    a.) s/p BILATERAL carotid endarterectomies: RIGHT (12/31/2010) and LEFT (02/09/2011); b.) carotid doppler 05/13/2023: 1-39% BICA with disturbed flow from the RIGHT subclavian.   Blindness of right eye    CAD (coronary artery disease)    a.) s/p 5v CABG 1989; b.) MV 01/02/2011: EF 75%, no ischemia   Cerebral microvascular disease    CHB (complete heart block) (HCC) 07/2014   a.) s/p St. Jude Assurity dual chamber PPM 08/13/2014   Chronic BILATERAL L5 pars interarticularis defect    Chronic kidney disease, stage IV (severe) (HCC) 2015   Chronic prostatitis    Chronic radicular lumbar pain    Colon polyps    Complication of anesthesia    a.) PONV   Constipation    DDD (degenerative disc disease), thoracolumbar    Diverticulosis    DOE (dyspnea on exertion)    Dupuytren's disease of finger and palm of right hand    Essential hypertension    GERD (gastroesophageal reflux disease)    Gout    Hemorrhoids    Hiatal hernia    History  of bilateral cataract extraction    HTN (hypertension)    Hyperlipidemia    Insomnia    Lacunar infarction (CVA) 01/01/2011   a,) noted on MRI brain 01/01/2011: remote lacunar infarcts of LEFT cerebellum and basal ganglia BILATERALLY   Myelolipoma of left adrenal gland    Myocardial infarction (HCC) 1976   a.) type/details unknown   NSTEMI (non-ST elevated myocardial infarction) (HCC) 08/11/2014   a.) troponins were trended: 0.35 --> 0.54 --> 0.54 ng/mL   Orthostasis    Osteoporosis    PAF (paroxysmal atrial fibrillation) (HCC) 07/2014   a.) CHA2DS2-VASc = 8 (age  x2, sex, HFpEF, HTN, CVA x2, vascular disease, T2DM) as of 07/01/2023; b.) cardiac rate/rhythm maintained on oral metoprolol succinate; no OAC   PONV (postoperative nausea and vomiting)    Positive ANA (antinuclear antibody)    a.) workup with rheumatology (-) for SLE   Presence of permanent cardiac pacemaker 08/13/2014   a.) s/p St. Jude Assurity dual chamber PPM 08/13/2014 to LEFT infraclavicular deltopectoral region   PVD (peripheral vascular disease) (HCC)    Recurrent falls    Right inguinal hernia    S/P CABG x 5 1989   Scoliosis    Symptomatic bradycardia    a.) s/p St. Jude Assurity dual chamber PPM 08/13/2014   Thoracic compression fracture (L1-L2)    a.) s/p L1 balloon kyphoplasty 08/13/2022   Thrombocytopenia (HCC)    TMJ (dislocation of temporomandibular joint)    Type 2 diabetes mellitus treated with insulin (HCC)     Social History: Social History   Socioeconomic History   Marital status: Married    Spouse name: [name]   Number of children: 4   Years of education: Not on file   Highest education level: Not on file  Occupational History   Occupation: retired    Associate Professor: RETIRED  Tobacco Use   Smoking status: Former    Current packs/day: 0.00    Average packs/day: 1 pack/day for 25.0 years (25.0 ttl pk-yrs)    Types: Cigarettes    Start date: 01/22/1948    Quit date: 01/21/1973    Years since  quitting: 50.6    Passive exposure: Past   Smokeless tobacco: Never   Tobacco comments:    began smoking in high school, quit age 42  Vaping Use   Vaping status: Never Used  Substance and Sexual Activity   Alcohol use: Yes    Comment: wine occassional   Drug use: No   Sexual activity: Never  Other Topics Concern   Not on file  Social History Narrative   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  Remarried 1998.     3 sons   Social Drivers of Corporate investment banker Strain: Low Risk  (01/11/2023)   Overall Financial Resource Strain (CARDIA)    Difficulty of Paying Living Expenses: Not hard at all  Food Insecurity: No Food Insecurity (01/11/2023)   Hunger Vital Sign    Worried About Running Out of Food in the Last Year: Never true    Ran Out of Food in the Last Year: Never true  Transportation Needs: No Transportation Needs (01/11/2023)   PRAPARE - Administrator, Civil Service (Medical): No    Lack of Transportation (Non-Medical): No  Physical Activity: Inactive (01/11/2023)   Exercise Vital Sign    Days of Exercise per Week: 0 days    Minutes of Exercise per Session: 0 min  Stress: No Stress Concern Present (01/11/2023)   [employer] of Occupational Health - Occupational Stress Questionnaire    Feeling of Stress : Not at all  Social Connections: Moderately Integrated (01/11/2023)   Social Connection and Isolation Panel [NHANES]    Frequency of Communication with Friends and Family: More than three times a week    Frequency of Social Gatherings with Friends and Family: More than three times a week    Attends Religious Services: More than 4 times per year    Active Member of [employer] or Organizations: No    Attends Banker  Meetings: Never    Marital Status: Married    Labs: No results found for: "HIV1RNAQUANT", "HIV1RNAVL", "CD4TABS"  RPR and STI No results found for: "LABRPR", "RPRTITER"      No data to  display          Hepatitis B Lab Results  Component Value Date   HEPBSAG NEGATIVE 09/18/2009   Hepatitis C No results found for: "HEPCAB", "HCVRNAPCRQN" Hepatitis A No results found for: "HAV" Lipids: Lab Results  Component Value Date   CHOL 148 12/17/2020   TRIG 160.0 (H) 12/17/2020   HDL 43.00 12/17/2020   CHOLHDL 3 12/17/2020   VLDL 32.0 12/17/2020   LDLCALC 73 12/17/2020     Assessment: Dawood presents to clinic today with his son Gabriel Rung for follow-up as he has been taking suppressive amoxicillin for prior enterococcal left knee prosthetic joint infection. He was initially diagnosed in 2017 and followed by Dr. Orvan Falconer after one reoccurrence. Has been on suppressive amoxicillin since then which he continues to tolerate well. Currently taking 500 mg once daily due to kidney function in 2024; could consider increasing back to twice daily or remaining at daily given CrCl ~ 23 ml/min. Will continue with once daily dosing given no signs of recurrence since last year's dose reduction. Sent refills to [facility] pharmacy.  Will check ESR and CRP with routine labs through PCP in a couple weeks and follow up with new provider in one year.   Plan: - Continue amoxicillin 500 mg once daily (refills sent)  - Check ESR and CRP with next routine labs through PCP  - Will schedule follow up for 1 year  Margarite Gouge, PharmD, CPP, BCIDP, AAHIVP Clinical Pharmacist Practitioner Infectious Diseases Clinical Pharmacist [facility] 08/26/2023, 5:24 PM

## 2023-08-27 ENCOUNTER — Other Ambulatory Visit: Payer: Self-pay

## 2023-08-27 ENCOUNTER — Ambulatory Visit: Admitting: Pharmacist

## 2023-08-27 ENCOUNTER — Ambulatory Visit: Admitting: Internal Medicine

## 2023-08-27 DIAGNOSIS — T8454XD Infection and inflammatory reaction due to internal left knee prosthesis, subsequent encounter: Secondary | ICD-10-CM | POA: Diagnosis not present

## 2023-08-27 MED ORDER — AMOXICILLIN 500 MG PO CAPS: 500.0000 mg | ORAL_CAPSULE | Freq: Every day | ORAL | 3 refills | Status: DC

## 2023-08-29 ENCOUNTER — Other Ambulatory Visit: Payer: Self-pay | Admitting: Family Medicine

## 2023-08-29 DIAGNOSIS — D649 Anemia, unspecified: Secondary | ICD-10-CM

## 2023-08-29 DIAGNOSIS — E1122 Type 2 diabetes mellitus with diabetic chronic kidney disease: Secondary | ICD-10-CM

## 2023-08-29 DIAGNOSIS — T8454XD Infection and inflammatory reaction due to internal left knee prosthesis, subsequent encounter: Secondary | ICD-10-CM

## 2023-08-30 ENCOUNTER — Encounter

## 2023-09-08 ENCOUNTER — Ambulatory Visit: Admitting: Physician Assistant

## 2023-09-08 ENCOUNTER — Encounter: Payer: Self-pay | Admitting: Physician Assistant

## 2023-09-08 VITALS — BP 146/62 | HR 89 | Ht 71.0 in | Wt 181.0 lb

## 2023-09-08 DIAGNOSIS — Z09 Encounter for follow-up examination after completed treatment for conditions other than malignant neoplasm: Secondary | ICD-10-CM

## 2023-09-08 DIAGNOSIS — K4030 Unilateral inguinal hernia, with obstruction, without gangrene, not specified as recurrent: Secondary | ICD-10-CM

## 2023-09-08 DIAGNOSIS — K4090 Unilateral inguinal hernia, without obstruction or gangrene, not specified as recurrent: Secondary | ICD-10-CM

## 2023-09-08 DIAGNOSIS — T8131XA Disruption of external operation (surgical) wound, not elsewhere classified, initial encounter: Secondary | ICD-10-CM

## 2023-09-08 DIAGNOSIS — T8131XD Disruption of external operation (surgical) wound, not elsewhere classified, subsequent encounter: Secondary | ICD-10-CM

## 2023-09-08 NOTE — Progress Notes (Signed)
 [facility] POST-OP OFFICE VISIT  09/08/2023  HPI: Dean Murphy is a 89 y.o. male s/p open right inguinal hernia repair on 02/07 which was complicated by wound dehiscence   He has continued to do well Incisions is 99% closed There remains a small (<1 cm) area to the medial corner which needs to epithelize No fever, chills, drainage He is active No other complaints   Vital signs: BP (!) 146/62   Pulse 89   Ht 5\' 11"  (1.803 m)   Wt 181 lb (82.1 kg)   SpO2 98%   BMI 25.24 kg/m    Physical Exam: Constitutional: Well appearing male, NAD Skin: Right inguinal incision now about 99% healed. There is a small (<1 cm) portion to the medial corner which needs to epithelize, no erythema. There remainder of the wound is completely closed   Assessment/Plan: This is a 89 y.o. male s/p open right inguinal hernia repair on 02/07 which was complicated by wound dehiscence    - He can keep this small medial corner of the wound covered as needed; okay to shower  - Oka to return to activity without restriction from surgical perspective              - I believe he can follow up with us  on as needed basis. He continues to have [facility] RN which I encouraged him to continue. If at any time there are concerns with this wound or clinical changes from a surgical perspective, we will be happy to see him. He, and his family are understanding and agreeable.   -- Apolonio Bay, PA-C [facility] 09/08/2023, 2:43 PM M-F: 7am - 4pm

## 2023-09-08 NOTE — Patient Instructions (Signed)
 Keep a clean dry gauze over the area and change daily until it heals fully.     Follow-up with our office as needed.  Please call and ask to speak with a nurse if you develop questions or concerns.

## 2023-09-11 ENCOUNTER — Other Ambulatory Visit: Payer: Self-pay | Admitting: Family Medicine

## 2023-09-14 ENCOUNTER — Encounter: Payer: Self-pay | Admitting: Family Medicine

## 2023-09-14 ENCOUNTER — Ambulatory Visit: Admitting: Family Medicine

## 2023-09-14 VITALS — BP 148/64 | HR 56 | Temp 97.7°F | Ht 71.0 in | Wt 184.0 lb

## 2023-09-14 DIAGNOSIS — K4090 Unilateral inguinal hernia, without obstruction or gangrene, not specified as recurrent: Secondary | ICD-10-CM

## 2023-09-14 DIAGNOSIS — D649 Anemia, unspecified: Secondary | ICD-10-CM

## 2023-09-14 DIAGNOSIS — E1122 Type 2 diabetes mellitus with diabetic chronic kidney disease: Secondary | ICD-10-CM | POA: Diagnosis not present

## 2023-09-14 DIAGNOSIS — Z794 Long term (current) use of insulin: Secondary | ICD-10-CM

## 2023-09-14 DIAGNOSIS — T8454XD Infection and inflammatory reaction due to internal left knee prosthesis, subsequent encounter: Secondary | ICD-10-CM

## 2023-09-14 DIAGNOSIS — N183 Chronic kidney disease, stage 3 unspecified: Secondary | ICD-10-CM

## 2023-09-14 DIAGNOSIS — Z659 Problem related to unspecified psychosocial circumstances: Secondary | ICD-10-CM

## 2023-09-14 LAB — CBC WITH DIFFERENTIAL/PLATELET
Basophils Absolute: 0 10*3/uL (ref 0.0–0.1)
Basophils Relative: 0.7 % (ref 0.0–3.0)
Eosinophils Absolute: 0.1 10*3/uL (ref 0.0–0.7)
Eosinophils Relative: 2.2 % (ref 0.0–5.0)
HCT: 32 % — ABNORMAL LOW (ref 39.0–52.0)
Hemoglobin: 10.4 g/dL — ABNORMAL LOW (ref 13.0–17.0)
Lymphocytes Relative: 28.1 % (ref 12.0–46.0)
Lymphs Abs: 1.4 10*3/uL (ref 0.7–4.0)
MCHC: 32.5 g/dL (ref 30.0–36.0)
MCV: 90 fl (ref 78.0–100.0)
Monocytes Absolute: 0.3 10*3/uL (ref 0.1–1.0)
Monocytes Relative: 6.3 % (ref 3.0–12.0)
Neutro Abs: 3.1 10*3/uL (ref 1.4–7.7)
Neutrophils Relative %: 62.7 % (ref 43.0–77.0)
Platelets: 94 10*3/uL — ABNORMAL LOW (ref 150.0–400.0)
RBC: 3.55 Mil/uL — ABNORMAL LOW (ref 4.22–5.81)
RDW: 17.9 % — ABNORMAL HIGH (ref 11.5–15.5)
WBC: 4.9 10*3/uL (ref 4.0–10.5)

## 2023-09-14 LAB — COMPREHENSIVE METABOLIC PANEL WITH GFR
ALT: 32 U/L (ref 0–53)
AST: 31 U/L (ref 0–37)
Albumin: 3.8 g/dL (ref 3.5–5.2)
Alkaline Phosphatase: 154 U/L — ABNORMAL HIGH (ref 39–117)
BUN: 43 mg/dL — ABNORMAL HIGH (ref 6–23)
CO2: 25 meq/L (ref 19–32)
Calcium: 9.1 mg/dL (ref 8.4–10.5)
Chloride: 112 meq/L (ref 96–112)
Creatinine, Ser: 2.49 mg/dL — ABNORMAL HIGH (ref 0.40–1.50)
GFR: 21.57 mL/min — ABNORMAL LOW (ref >60.00)
Glucose, Bld: 155 mg/dL — ABNORMAL HIGH (ref 70–99)
Potassium: 5.5 meq/L — ABNORMAL HIGH (ref 3.5–5.1)
Sodium: 145 meq/L (ref 135–145)
Total Bilirubin: 0.4 mg/dL (ref 0.2–1.2)
Total Protein: 6.1 g/dL (ref 6.0–8.3)

## 2023-09-14 LAB — LIPID PANEL
Cholesterol: 111 mg/dL (ref 0–200)
HDL: 43.2 mg/dL (ref >39.00)
LDL Cholesterol: 42 mg/dL (ref 0–99)
NonHDL: 68.29
Total CHOL/HDL Ratio: 3
Triglycerides: 131 mg/dL (ref 0.0–149.0)
VLDL: 26.2 mg/dL (ref 0.0–40.0)

## 2023-09-14 LAB — C-REACTIVE PROTEIN: CRP: 1.1 mg/dL (ref 0.5–20.0)

## 2023-09-14 LAB — IRON: Iron: 46 ug/dL (ref 42–165)

## 2023-09-14 LAB — FERRITIN: Ferritin: 24 ng/mL (ref 22.0–322.0)

## 2023-09-14 LAB — SEDIMENTATION RATE: Sed Rate: 15 mm/h (ref 0–20)

## 2023-09-14 LAB — HEMOGLOBIN A1C: Hgb A1c MFr Bld: 8.9 % — ABNORMAL HIGH (ref 4.6–6.5)

## 2023-09-14 MED ORDER — LANTUS SOLOSTAR 100 UNIT/ML ~~LOC~~ SOPN: 38.0000 [IU] | PEN_INJECTOR | Freq: Every day | SUBCUTANEOUS | Status: DC

## 2023-09-14 NOTE — Progress Notes (Signed)
 D/w pt about continued amoxil  and checking inflammatory markers.  He has been seen by the infectious disease clinic with plan for long-term amoxicillin  use.  No fevers.  He has some L ankle edema at baseline.  Walking with a cane at baseline.   CKD on lokelma  and torsemide .  Recheck labs pending.  He has had some lower extremity swelling and he was asking about potentially increasing his torsemide .  Discussed that I wanted to check his labs first.  DM2.  Taking 38 units.  Sugar ~100-120s.  Recheck labs pending.  Discussed that his A1c may not be as accurate as his fructosamine level and his fructosamine level will take longer be resulted.  History of anemia.  Recheck labs pending.  He had surgery clinic f/u done.  D/w pt.  No pain now but recovery was significant.  Discussed.  His wife has memory changes.  D/w pt.  He and his wife are going to move in with family.  This is atraumatic set of events, with his wife having memory loss and the both of them needing to relocate.  He is accepting that she needs extra help and he wants to move with her, but this is a huge change in his life.  I thanked him for his effort.  Discussed.  Meds, vitals, and allergies reviewed.   ROS: Per HPI unless specifically indicated in ROS section   GEN: nad, alert, tearful but regains composure. HEENT: mucous membranes moist NECK: supple w/o LA CV: rrr. PULM: ctab, no inc wob ABD: soft, +bs EXT: Trace BLE edema SKIN: Well-perfused

## 2023-09-14 NOTE — Patient Instructions (Signed)
 Check your med list at home and let me know if you need refills.   Go to the lab on the way out.   If you have mychart we'll likely use that to update you.    Take care.  Glad to see you.

## 2023-09-15 ENCOUNTER — Telehealth: Payer: Self-pay

## 2023-09-15 ENCOUNTER — Encounter: Payer: Self-pay | Admitting: Family Medicine

## 2023-09-15 DIAGNOSIS — Z659 Problem related to unspecified psychosocial circumstances: Secondary | ICD-10-CM | POA: Insufficient documentation

## 2023-09-15 NOTE — Telephone Encounter (Signed)
 Copied from CRM [id number]. Topic: Clinical - Home Health Verbal Orders >> Sep 15, 2023  2:58 PM Orien Bird wrote: Caller/Agency: [facility] Callback Number: [phone] Service Requested: Skilled Nursing Frequency: 1 time a week for 5 weeks  Any new concerns about the patient? No

## 2023-09-15 NOTE — Assessment & Plan Note (Signed)
 Continue insulin  as is for now.  I am awaiting his fructosamine level.  See notes on labs.

## 2023-09-15 NOTE — Telephone Encounter (Signed)
 Please give the order.  Thanks.

## 2023-09-15 NOTE — Assessment & Plan Note (Signed)
 See notes on labs.

## 2023-09-15 NOTE — Assessment & Plan Note (Signed)
 Improved, after significant recovery.

## 2023-09-15 NOTE — Assessment & Plan Note (Signed)
 His wife has memory changes.  D/w pt.  He and his wife are going to move in with family.  This is atraumatic set of events, with his wife having memory loss and the both of them needing to relocate.  He is accepting that she needs extra help and he wants to move with her, but this is a huge change in his life.  I thanked him for his effort.  Discussed.

## 2023-09-15 NOTE — Assessment & Plan Note (Signed)
 Continue Amoxil .  See notes on labs.

## 2023-09-15 NOTE — Assessment & Plan Note (Signed)
 CKD on lokelma  and torsemide .  Recheck labs pending.  He has had some lower extremity swelling and he was asking about potentially increasing his torsemide .  Discussed that I wanted to check his labs first.

## 2023-09-16 LAB — FRUCTOSAMINE: Fructosamine: 371 umol/L — ABNORMAL HIGH (ref 205–285)

## 2023-09-16 NOTE — Telephone Encounter (Signed)
 Returned call to Amy and advised of orders

## 2023-09-30 ENCOUNTER — Ambulatory Visit (INDEPENDENT_AMBULATORY_CARE_PROVIDER_SITE_OTHER)

## 2023-09-30 DIAGNOSIS — I442 Atrioventricular block, complete: Secondary | ICD-10-CM

## 2023-09-30 LAB — CUP PACEART REMOTE DEVICE CHECK
Battery Remaining Longevity: 7 mo
Battery Remaining Percentage: 10 %
Battery Voltage: 2.83 V
Brady Statistic AP VP Percent: 16 %
Brady Statistic AP VS Percent: 1 %
Brady Statistic AS VP Percent: 84 %
Brady Statistic AS VS Percent: 1 %
Brady Statistic RA Percent Paced: 16 %
Brady Statistic RV Percent Paced: 99 %
Date Time Interrogation Session: 20250508020013
Implantable Lead Connection Status: 753985
Implantable Lead Connection Status: 753985
Implantable Lead Implant Date: 20160321
Implantable Lead Implant Date: 20160321
Implantable Lead Location: 753859
Implantable Lead Location: 753860
Implantable Pulse Generator Implant Date: 20160321
Lead Channel Impedance Value: 360 Ohm
Lead Channel Impedance Value: 410 Ohm
Lead Channel Pacing Threshold Amplitude: 1 V
Lead Channel Pacing Threshold Amplitude: 1 V
Lead Channel Pacing Threshold Pulse Width: 0.5 ms
Lead Channel Pacing Threshold Pulse Width: 0.8 ms
Lead Channel Sensing Intrinsic Amplitude: 12 mV
Lead Channel Sensing Intrinsic Amplitude: 3 mV
Lead Channel Setting Pacing Amplitude: 2.5 V
Lead Channel Setting Pacing Amplitude: 3 V
Lead Channel Setting Pacing Pulse Width: 0.5 ms
Lead Channel Setting Sensing Sensitivity: 8 mV
Pulse Gen Model: 2240
Pulse Gen Serial Number: 7734710

## 2023-10-04 ENCOUNTER — Encounter: Payer: Self-pay | Admitting: Internal Medicine

## 2023-10-19 ENCOUNTER — Ambulatory Visit: Payer: Self-pay

## 2023-10-19 NOTE — Telephone Encounter (Signed)
 Chief Complaint: HTN Symptoms: none Frequency: today Pertinent Negatives: Patient denies CP, SOB, dizziness, weakness, one-sided weakness, H/A, vomiting, blurry vision Disposition: [] ED /[] Urgent Care (no appt availability in office) / [x] Appointment(In office/virtual)/ []  [facility]/ [] Home Care/ [] Refused Recommended Disposition /[] [facility]/ []  Follow-up with PCP Additional Notes: Amy from [facility] called to report that pt's BP was 160/60 after he took his BP medication earlier that AM. RN called Amy back at the number she left. RN spoke to a person that states they do not have this pt's information on file and that this RN may have been given the wrong number. RN called the pt instead. Pt denies symptoms of HTN. Pt states he's a "bit unsteady" on his feet and uses a walker in the house but denies that this is new or worse than usual for him. Pt states he is taking all his BP medications as prescribed w/ no missed doses. RN scheduled pt for Thursday to discuss medications. RN advised pt if he becomes symptomatic and develops CP, SOB, dizziness, H/A, one-sided weakness, vomiting, or any worsening to go to the ED. Pt verbalized understanding.     Copied from CRM [id number]. Topic: Clinical - Medical Advice >> Oct 19, 2023 11:57 AM Alyse July wrote: Reason for CRM: Amy from [facility] called to report patient most recent BP reading which was 160/60 reading taken at 11:36am, reading is after patient took BP medication at 8:00 am. Amy would like to know if patient medication needs to be adjusted. [phone] Reason for Disposition  Systolic BP  >= 160 OR Diastolic >= 100  Answer Assessment - Initial Assessment Questions 1. BLOOD PRESSURE: "What is the blood pressure?" "Did you take at least two measurements 5 minutes apart?"     160/60 this AM at 11:36, after medication that he took at 0800 2. ONSET: "When did you take your blood pressure?"     This AM 3. HOW:  "How did you take your blood pressure?" (e.g., automatic home BP monitor, visiting nurse)     Home health nurse  4. HISTORY: "Do you have a history of high blood pressure?"     Yes  5. MEDICINES: "Are you taking any medicines for blood pressure?" "Have you missed any doses recently?"     Amlodipine , losartan   6. OTHER SYMPTOMS: "Do you have any symptoms?" (e.g., blurred vision, chest pain, difficulty breathing, headache, weakness)     No OB. No CP. No dizziness. No H/A. Denies blurry vision. "I'm a little bit unsteady, while I am in the house I am using a walker."  Protocols used: Blood Pressure - High-A-AH

## 2023-10-20 NOTE — Telephone Encounter (Signed)
 Noted. Thanks.

## 2023-10-21 ENCOUNTER — Encounter: Payer: Self-pay | Admitting: Family Medicine

## 2023-10-21 ENCOUNTER — Ambulatory Visit: Admitting: Family Medicine

## 2023-10-21 VITALS — BP 134/68 | HR 68 | Temp 98.4°F | Ht 71.0 in | Wt 178.6 lb

## 2023-10-21 DIAGNOSIS — T8454XD Infection and inflammatory reaction due to internal left knee prosthesis, subsequent encounter: Secondary | ICD-10-CM | POA: Diagnosis not present

## 2023-10-21 DIAGNOSIS — I10 Essential (primary) hypertension: Secondary | ICD-10-CM

## 2023-10-21 MED ORDER — AMOXICILLIN 500 MG PO CAPS
500.0000 mg | ORAL_CAPSULE | Freq: Two times a day (BID) | ORAL | Status: DC
Start: 2023-10-21 — End: 2023-10-27

## 2023-10-21 NOTE — Progress Notes (Signed)
 Hypertension:    Using medication without problems or lightheadedness: yes Chest pain with exertion:no Edema: occ L ankle and knee swelling but not today on exam.  Swelling: no BP was mildly elevated at home per patient report.  Pressures controlled today.  He has renal clinic f/u pending for the fall.    D/w pt about restarting MWF lokelma .  This had previously come through nephrology.  He had more L knee aches in the meantime, with amoxil  daily.  He is still on amoxil .  He was prev on BID dosing, in distant past.  D/w pt about options.  Knee isn't red or puffy.    He is using a walker/cane as needed.    Meds, vitals, and allergies reviewed.   ROS: Per HPI unless specifically indicated in ROS section   GEN: nad, alert and oriented HEENT: NCAT  NECK: supple w/o LA CV: rrr. PULM: ctab, no inc wob ABD: soft, +bs EXT: no edema SKIN: no acute rash  30 minutes were devoted to patient care in this encounter (this includes time spent reviewing the patient's file/history, interviewing and examining the patient, counseling/reviewing plan with patient).

## 2023-10-21 NOTE — Patient Instructions (Signed)
 Go to the lab on the way out.   If you have mychart we'll likely use that to update you.    Take care.  Glad to see you. I would take lokelma  unless the kidney clinic advised you to stop it.  You could try taking amoxil  twice a day and see if the joint pain improves.  Please update me in about 1 week.

## 2023-10-22 LAB — C-REACTIVE PROTEIN: CRP: <1 mg/dL (ref 0.5–20.0)

## 2023-10-22 LAB — SEDIMENTATION RATE: Sed Rate: 13 mm/h (ref 0–20)

## 2023-10-24 ENCOUNTER — Ambulatory Visit: Payer: Self-pay | Admitting: Family Medicine

## 2023-10-24 ENCOUNTER — Other Ambulatory Visit: Payer: Self-pay | Admitting: Family Medicine

## 2023-10-24 NOTE — Assessment & Plan Note (Signed)
 See notes on labs.  Continue amoxicillin  but increase to twice a day dosing in the meantime.  I asked him to update me about his symptoms after that.

## 2023-10-24 NOTE — Assessment & Plan Note (Signed)
 Continue Lokelma  per renal clinic.  Continue amlodipine  losartan  metoprolol  and torsemide .

## 2023-10-25 ENCOUNTER — Encounter: Payer: Self-pay | Admitting: Cardiovascular Disease

## 2023-10-25 ENCOUNTER — Ambulatory Visit: Payer: Medicare Other | Attending: Cardiovascular Disease | Admitting: Cardiovascular Disease

## 2023-10-25 ENCOUNTER — Telehealth: Payer: Self-pay

## 2023-10-25 VITALS — BP 150/52 | HR 74 | Ht 71.0 in | Wt 178.6 lb

## 2023-10-25 DIAGNOSIS — I442 Atrioventricular block, complete: Secondary | ICD-10-CM

## 2023-10-25 DIAGNOSIS — I10 Essential (primary) hypertension: Secondary | ICD-10-CM

## 2023-10-25 DIAGNOSIS — I2510 Atherosclerotic heart disease of native coronary artery without angina pectoris: Secondary | ICD-10-CM

## 2023-10-25 DIAGNOSIS — D638 Anemia in other chronic diseases classified elsewhere: Secondary | ICD-10-CM

## 2023-10-25 DIAGNOSIS — I480 Paroxysmal atrial fibrillation: Secondary | ICD-10-CM

## 2023-10-25 DIAGNOSIS — I5032 Chronic diastolic (congestive) heart failure: Secondary | ICD-10-CM | POA: Diagnosis not present

## 2023-10-25 DIAGNOSIS — N184 Chronic kidney disease, stage 4 (severe): Secondary | ICD-10-CM

## 2023-10-25 DIAGNOSIS — E7800 Pure hypercholesterolemia, unspecified: Secondary | ICD-10-CM

## 2023-10-25 DIAGNOSIS — Z950 Presence of cardiac pacemaker: Secondary | ICD-10-CM

## 2023-10-25 DIAGNOSIS — E1122 Type 2 diabetes mellitus with diabetic chronic kidney disease: Secondary | ICD-10-CM

## 2023-10-25 DIAGNOSIS — Z9889 Other specified postprocedural states: Secondary | ICD-10-CM

## 2023-10-25 NOTE — Telephone Encounter (Signed)
 Copied from CRM [id number]. Topic: Clinical - Home Health Verbal Orders >> Oct 25, 2023 10:03 AM Marlan Silva wrote: Caller/Agency: Amy from [facility]  Callback Number: [phone] (secure line) Service Requested: Other Frequency: N/a Any new concerns about the patient? Yes,  Amy from [facility] called to report patient most recent BP reading which was 160/60 reading taken at 11:36am, reading is after patient took BP medication at 8:00 am. Amy would like to know if patient medication needs to be adjusted. [phone]. Amy also would like to know if Dr. Vallarie Gauze would like to continue home health care.

## 2023-10-25 NOTE — Telephone Encounter (Signed)
 FYI

## 2023-10-25 NOTE — Progress Notes (Signed)
 Cardiology Office Note:    Date:  10/26/2023   ID:  Erling Hawthorne, DOB 1926-07-17, MRN [id number]  PCP:  Donnie Galea, MD   [facility] Providers Cardiologist:  None Electrophysiologist:  Manya Sells, MD     Referring MD: Donnie Galea, MD   Chief Complaint  Patient presents with   Coronary Artery Disease   Atrial Fibrillation    History of Present Illness:    Dean Murphy is a 89 y.o. male with a hx of multiple cardiovascular problems including coronary artery disease (previous MI, CABG 1989), history of stroke and bilateral carotid stenosis status post endarterectomy (2012), complete heart block s/p St Jude dual-chamber permanent pacemaker (Dr. Carolynne Citron, 2016), paroxysmal atrial fibrillation, HFpEF (primarily manifest in the setting of heart block), as well as CKD stage IV (most recent creatinine 2.7, Dr. Zelda Hickman), DM type II requiring insulin , HTN, HLP, GERD, history of prosthetic left knee infection on chronic antibiotic suppression, here to establish general cardiology care.  His wife Devra Fontana is also my patient.  He is accompanied today by his son,  He tells me that he feels great.  He has more energy than he did last time he was here.  He denies orthopnea, PND, lower extremity edema, angina or dyspnea on exertion, palpitations, dizziness, syncope.  Pacemaker interrogation today shows that he is device dependent.  He has complete heart block.  Presenting rhythm is atrial paced, ventricular sensed.  With the device programmed DDDR lower rate limit of 50 bpm he only has 15% atrial pacing with a decent heart rate histogram distribution.  He has 99% ventricular pacing.  He has not had any episodes of high ventricular rate.  He has a low burden of atrial fibrillation (less than 1%, a 3-hour episode in February and a 2-hour episode in March, the first meaningful events since March 2024).  Eliquis  was discontinued last year due to bleeding complications.  He does not have a  history of stroke or TIA.  Estimated generator longevity is just over 7 months.  All lead parameters are excellent.  Lipid parameters are great, but glycemic control is mediocre with a hemoglobin A1c of 8.9%.  He makes it very clear that at age 69 he is not going to stop drinking Cheerwine when he wants it or eating a dessert if he has the desire.  His most recent echocardiogram from 12/25/2022 showed normal left ventricular systolic function with EF 60 to 65% and without wall motion abnormalities (an older echo has suggested apical septal hypokinesis).  He has grade 1 diastolic dysfunction and mild aortic sclerosis without stenosis.  Past Medical History:  Diagnosis Date   (HFpEF) heart failure with preserved ejection fraction (HCC) 07/2014   a.) initially Dx'd in 07/2014 in setting of CHB; b.) TTE 09/02/2017: EF 55-60%, apical septal HK, G1DD, sev LAE, mild MR, PASP 41; c.) TTE 12/25/2022: EF 60-65%, mild LVH, G1DD, mild AoV sclerosis with no stenosis   Anemia due to chronic blood loss    Aortic atherosclerosis (HCC)    Arthritis    AVM (arteriovenous malformation) of colon    a.)colonoscopy 08/10/2022: angioectasias (AVM) x 2 --> Tx'd with APC   Bilateral carotid artery disease (HCC)    a.) s/p BILATERAL carotid endarterectomies: RIGHT (12/31/2010) and LEFT (02/09/2011); b.) carotid doppler 05/13/2023: 1-39% BICA with disturbed flow from the RIGHT subclavian.   Blindness of right eye    CAD (coronary artery disease)    a.) s/p 5v CABG 1989;  b.) MV 01/02/2011: EF 75%, no ischemia   Cerebral microvascular disease    CHB (complete heart block) (HCC) 07/2014   a.) s/p St. Jude Assurity dual chamber PPM 08/13/2014   Chronic BILATERAL L5 pars interarticularis defect    Chronic kidney disease, stage IV (severe) (HCC) 2015   Chronic prostatitis    Chronic radicular lumbar pain    Colon polyps    Complication of anesthesia    a.) PONV   Constipation    DDD (degenerative disc disease),  thoracolumbar    Diverticulosis    DOE (dyspnea on exertion)    Dupuytren's disease of finger and palm of right hand    Essential hypertension    GERD (gastroesophageal reflux disease)    Gout    Hemorrhoids    Hiatal hernia    History of bilateral cataract extraction    HTN (hypertension)    Hyperlipidemia    Insomnia    Lacunar infarction (CVA) 01/01/2011   a,) noted on MRI brain 01/01/2011: remote lacunar infarcts of LEFT cerebellum and basal ganglia BILATERALLY   Myelolipoma of left adrenal gland    Myocardial infarction (HCC) 1976   a.) type/details unknown   NSTEMI (non-ST elevated myocardial infarction) (HCC) 08/11/2014   a.) troponins were trended: 0.35 --> 0.54 --> 0.54 ng/mL   Orthostasis    Osteoporosis    PAF (paroxysmal atrial fibrillation) (HCC) 07/2014   a.) CHA2DS2-VASc = 8 (age x2, sex, HFpEF, HTN, CVA x2, vascular disease, T2DM) as of 07/01/2023; b.) cardiac rate/rhythm maintained on oral metoprolol  succinate; no OAC   PONV (postoperative nausea and vomiting)    Positive ANA (antinuclear antibody)    a.) workup with rheumatology (-) for SLE   Presence of permanent cardiac pacemaker 08/13/2014   a.) s/p St. Jude Assurity dual chamber PPM 08/13/2014 to LEFT infraclavicular deltopectoral region   PVD (peripheral vascular disease) (HCC)    Recurrent falls    Right inguinal hernia    S/P CABG x 5 1989   Scoliosis    Symptomatic bradycardia    a.) s/p St. Jude Assurity dual chamber PPM 08/13/2014   Thoracic compression fracture (L1-L2)    a.) s/p L1 balloon kyphoplasty 08/13/2022   Thrombocytopenia (HCC)    TMJ (dislocation of temporomandibular joint)    Type 2 diabetes mellitus treated with insulin  [facility])     Past Surgical History:  Procedure Laterality Date   CARDIAC CATHETERIZATION  08/11/2014   Procedure: TEMPORARY PACEMAKER;  Surgeon: Avanell Leigh, MD;  Location: [facility] CATH LAB;  Service: Cardiovascular;;   CAROTID ENDARTERECTOMY Right 12/31/2010    CAROTID ENDARTERECTOMY Left 02/09/2011   CATARACT EXTRACTION W/ INTRAOCULAR LENS IMPLANT Bilateral    CHOLECYSTECTOMY     COLONOSCOPY WITH PROPOFOL  N/A 08/10/2022   Procedure: COLONOSCOPY WITH PROPOFOL ;  Surgeon: Sergio Dandy, MD;  Location: [facility] ENDOSCOPY;  Service: Gastroenterology;  Laterality: N/A;   CORONARY ARTERY BYPASS GRAFT  05/26/1987   x 5 vessels   ENDOSCOPIC RETROGRADE CHOLANGIOPANCREATOGRAPHY (ERCP) WITH PROPOFOL  N/A 01/27/2022   Procedure: ENDOSCOPIC RETROGRADE CHOLANGIOPANCREATOGRAPHY (ERCP) WITH PROPOFOL ;  Surgeon: Asencion Blacksmith, MD;  Location: [facility] ENDOSCOPY;  Service: Gastroenterology;  Laterality: N/A;   ESOPHAGOGASTRODUODENOSCOPY N/A 08/09/2022   Procedure: ESOPHAGOGASTRODUODENOSCOPY (EGD);  Surgeon: Albertina Hugger, MD;  Location[facility] ENDOSCOPY;  Service: Gastroenterology;  Laterality: N/A;   HOT HEMOSTASIS N/A 08/10/2022   Procedure: HOT HEMOSTASIS (ARGON PLASMA COAGULATION/BICAP);  Surgeon: Nandigam, Kavitha V, MD;  Location: [facility] ENDOSCOPY;  Service: Gastroenterology;  Laterality: N/A;  I & D KNEE WITH POLY EXCHANGE Left 07/29/2015   Procedure: IRRIGATION AND DEBRIDEMENT LEFT KNEE WITH POLY EXCHANGE;  Surgeon: Adonica Hoose, MD;  Location: [facility] OR;  Service: Orthopedics;  Laterality: Left;   INGUINAL HERNIA REPAIR Right 07/02/2023   Procedure: HERNIA REPAIR INGUINAL ADULT, open, RNFA to assist;  Surgeon: Alben Alma, MD;  Location: [facility] ORS;  Service: General;  Laterality: Right;   IR GENERIC HISTORICAL  03/04/2016   IR US  GUIDE VASC ACCESS RIGHT 03/04/2016 Artice Last, MD [facility]-INTER[facility] RAD   IR GENERIC HISTORICAL  03/04/2016   IR FLUORO GUIDE CV LINE RIGHT 03/04/2016 Artice Last, MD [facility]   IR KYPHO LUMBAR INC FX REDUCE BONE BX UNI/BIL CANNULATION INC/IMAGING  08/13/2022   PERMANENT PACEMAKER INSERTION N/A 08/13/2014   STJ dual chamber pacemaker implanted by Dr Carolynne Citron for CHB   REMOVAL OF STONES  01/27/2022   Procedure: REMOVAL OF STONES;  Surgeon: Asencion Blacksmith, MD;  Location: [facility] ENDOSCOPY;  Service: Gastroenterology;;   [address]  01/27/2022   Procedure: [address];  Surgeon: Asencion Blacksmith, MD;  Location: [facility] ENDOSCOPY;  Service: Gastroenterology;;   TONSILLECTOMY     TOTAL KNEE ARTHROPLASTY Bilateral     Current Medications: Current Meds  Medication Sig   amLODipine  (NORVASC ) 5 MG tablet TAKE 1 TABLET (5 MG TOTAL) BY MOUTH DAILY.   amoxicillin  (AMOXIL ) 500 MG capsule Take 1 capsule (500 mg total) by mouth 2 (two) times daily.   B-D UF III MINI PEN NEEDLES 31G X 5 MM MISC USE DAILY WITH INSULIN  PEN   cholecalciferol (VITAMIN D3) 25 MCG (1000 UNIT) tablet Take 1,000 Units by mouth daily.   cyanocobalamin  (VITAMIN B12) 1000 MCG tablet Take 1 tablet (1,000 mcg total) by mouth daily.   dorzolamide-timolol (COSOPT) 22.3-6.8 MG/ML ophthalmic solution Place 1 drop into both eyes daily in the afternoon. In both eye   insulin  glargine (LANTUS  SOLOSTAR) 100 UNIT/ML Solostar Pen Inject 38 Units into the skin daily.   Iron , Ferrous Sulfate , 325 (65 Fe) MG TABS Take 325 mg by mouth 3 (three) times a week.   losartan  (COZAAR ) 100 MG tablet Take 100 mg by mouth at bedtime.   metoprolol  succinate (TOPROL  XL) 25 MG 24 hr tablet Take 1 tablet (25 mg total) by mouth at bedtime.   Multiple Vitamins-Minerals (PRESERVISION AREDS 2) CAPS Take 1 capsule by mouth daily.   pantoprazole  (PROTONIX ) 40 MG tablet TAKE 1 TABLET BY MOUTH TWICE A DAY   rosuvastatin  (CRESTOR ) 20 MG tablet Take 1 tablet (20 mg total) by mouth daily.   sodium zirconium cyclosilicate  (LOKELMA ) 10 g PACK packet Take 10 g by mouth 3 (three) times a week.   torsemide  (DEMADEX ) 10 MG tablet Take 1 tablet (10 mg total) by mouth 2 (two) times a week. Monday and Friday     Allergies:   Zoledronic  acid and Nsaids   Family History: The patient's family history includes Cancer in an other family member; Diabetes in his mother, son, son, and another family member; Heart attack in his  brother, father, and mother; Heart disease in his brother, father, and mother; Hypertension in his brother, father, son, and son; Varicose Veins in his mother. There is no history of Colon cancer.  ROS:   Please see the history of present illness.     All other systems reviewed and are negative.  EKGs/Labs/Other Studies Reviewed:    The following studies were reviewed today: Comprehensive pacemaker interrogation in the office (see above)  Echocardiogram 12/25/2022   1. Left ventricular ejection fraction, by estimation, is 60 to 65%. The  left ventricle has normal function. The left ventricle has no regional  wall motion abnormalities. There is mild left ventricular hypertrophy.  Left ventricular diastolic parameters  are consistent with Grade I diastolic dysfunction (impaired relaxation).   2. Right ventricular systolic function is normal. The right ventricular  size is normal.   3. The mitral valve is normal in structure. Mild mitral valve  regurgitation. No evidence of mitral stenosis.   4. The aortic valve is tricuspid. There is mild calcification of the  aortic valve. There is mild thickening of the aortic valve. Aortic valve  regurgitation is not visualized. Aortic valve sclerosis is present, with  no evidence of aortic valve stenosis.   5. The inferior vena cava is normal in size with greater than 50%  respiratory variability, suggesting right atrial pressure of 3 mmHg.    Recent Labs: 12/03/2022: TSH 2.54 05/28/2023: Pro B Natriuretic peptide (BNP) 348.0 09/14/2023: ALT 32; BUN 43; Creatinine, Ser 2.49; Hemoglobin 10.4; Platelets 94.0; Potassium 5.5 No hemolysis seen; Sodium 145  Recent Lipid Panel    Component Value Date/Time   CHOL 111 09/14/2023 1054   TRIG 131.0 09/14/2023 1054   TRIG 62 04/28/2006 0917   HDL 43.20 09/14/2023 1054   CHOLHDL 3 09/14/2023 1054   VLDL 26.2 09/14/2023 1054   LDLCALC 42 09/14/2023 1054   LDLCALC 71 04/12/2020 1705   LDLDIRECT  135.1 03/12/2014 0812    Risk Assessment/Calculations:    CHA2DS2-VASc Score = 6   This indicates a 9.7% annual risk of stroke. The patient's score is based upon: CHF History: 1 HTN History: 1 Diabetes History: 1 Stroke History: 0 Vascular Disease History: 1 Age Score: 2 Gender Score: 0     Pacemaker interrogation shows atrial sensed, ventricular paced rhythm  EKG Interpretation Date/Time:    Ventricular Rate:    PR Interval:    QRS Duration:    QT Interval:    QTC Calculation:   R Axis:      Text Interpretation:                 Physical Exam:    VS:  BP (!) 150/52 (BP Location: Left Arm, Patient Position: Sitting)   Pulse 74   Ht 5\' 11"  (1.803 m)   Wt 81 kg   SpO2 96%   BMI 24.91 kg/m     Wt Readings from Last 3 Encounters:  10/25/23 81 kg  10/21/23 81 kg  09/14/23 83.5 kg     General: Alert, oriented x3, no distress, healthy left subclavian pacemaker site Head: no evidence of trauma, PERRL, EOMI, no exophtalmos or lid lag, no myxedema, no xanthelasma; normal ears, nose and oropharynx Neck: normal jugular venous pulsations and no hepatojugular reflux; brisk carotid pulses without delay and no carotid bruits Chest: clear to auscultation, no signs of consolidation by percussion or palpation, normal fremitus, symmetrical and full respiratory excursions Cardiovascular: normal position and quality of the apical impulse, regular rhythm, normal first and paradoxically split second heart sounds, no murmurs, rubs or gallops Abdomen: no tenderness or distention, no masses by palpation, no abnormal pulsatility or arterial bruits, normal bowel sounds, no hepatosplenomegaly Extremities: no clubbing, cyanosis or edema; 2+ radial, ulnar and brachial pulses bilaterally; 2+ right femoral, posterior tibial and dorsalis pedis pulses; 2+ left femoral, posterior tibial and dorsalis pedis pulses; no subclavian or femoral bruits Neurological: grossly nonfocal Psych: Normal  mood  and affect   ASSESSMENT:    1. Chronic diastolic CHF (congestive heart failure) (HCC)   2. Coronary artery disease involving native coronary artery of native heart without angina pectoris   3. Paroxysmal atrial fibrillation (HCC)   4. CHB (complete heart block) (HCC)   5. Pacemaker   6. Anemia in other chronic diseases classified elsewhere   7. History of bilateral carotid endarterectomy   8. Hypercholesterolemia   9. Essential hypertension   10. Type 2 diabetes mellitus with stage 4 chronic kidney disease, without long-term current use of insulin  (HCC)    PLAN:    In order of problems listed above:  CHF: Clinically euvolemic without daily diuretics, good functional status, normal LVEF.  Energy level has improved, may be due to improvement in his degree of anemia. CAD: Angina pectoris.  Lipid parameters in target range.  Start aspirin  81 mg daily. Parox AFib: Very low burden of arrhythmia, under 1%, with episodes lasting 2-3 hours in February and again in March, but otherwise arrhythmia free since last March 2024.  Not on anticoagulation due to bleeding problems.  Anticoagulation was stopped last fall. CHB: 100% ventricular pacing.  Pacemaker dependent. Pacemaker: Normal device function.  Approaching need for generator change out anticipated around the turn of the year.  Now checking battery voltage on a monthly basis. Anemia: There is probably a component of bone marrow dysfunction since he also has mild thrombocytopenia, normocytic normochromic anemia.  Renal insufficiency may be contributory.  Previous labs did not show low ferritin though.  He is taking a low-dose iron  supplement. Carotid stenosis status post bilateral endarterectomy: Ultrasound performed earlier this year shows no significant obstruction bilaterally.  Somewhat turbulent flow in the right subclavian artery, but without significant velocity acceleration (204 cm/s versus 177 cm/s on the left). HLP: Continue current  statin prescription.  Excellent lipid parameters. HTN: Blood pressure is adequately controlled.  Although the systolic blood pressure is a little high, he has low diastolic blood pressure around 50.  Would avoid perfect systolic blood pressure control since this may increase his risk of falls or other complications.  Continue metoprolol  and losartan . DM2:  hemoglobin A1c shows excellent control in March (6.3%), but very poorly controlled diabetes in July at 13.4%, but with improvement down to 10.1% currently.  I am not sure for the reason for his wide variation, but he seems to be on the right track towards improvement. CKD4: Most recent creatinine 2.5, creatinine has been very stable in the 2.4-2.9 range over the last year.  GFR is approximately 20-22.            Medication Adjustments/Labs and Tests Ordered: Current medicines are reviewed at length with the patient today.  Concerns regarding medicines are outlined above.  Orders Placed This Encounter  Procedures   EKG 12-Lead   No orders of the defined types were placed in this encounter.   Patient Instructions  Medication Instructions:  No changes *If you need a refill on your cardiac medications before your next appointment, please call your pharmacy*  Follow-Up: At [facility], you and your health needs are our priority.  As part of our continuing mission to provide you with exceptional heart care, our providers are all part of one team.  This team includes your primary Cardiologist (physician) and Advanced Practice Providers or APPs (Physician Assistants and Nurse Practitioners) who all work together to provide you with the care you need, when you need it.  Your next appointment:  6 month(s)  Provider:   Dr Alvis Ba   We recommend signing up for the patient portal called "MyChart".  Sign up information is provided on this After Visit Summary.  MyChart is used to connect with patients for Virtual Visits  (Telemedicine).  Patients are able to view lab/test results, encounter notes, upcoming appointments, etc.  Non-urgent messages can be sent to your provider as well.   To learn more about what you can do with MyChart, go to [email or web address].         Signed, Luana Rumple, MD  10/26/2023 8:31 AM    [facility]

## 2023-10-25 NOTE — Telephone Encounter (Signed)
 Not currently on active med list

## 2023-10-25 NOTE — Patient Instructions (Signed)
 Medication Instructions:  No changes *If you need a refill on your cardiac medications before your next appointment, please call your pharmacy*  Follow-Up: At [facility], you and your health needs are our priority.  As part of our continuing mission to provide you with exceptional heart care, our providers are all part of one team.  This team includes your primary Cardiologist (physician) and Advanced Practice Providers or APPs (Physician Assistants and Nurse Practitioners) who all work together to provide you with the care you need, when you need it.  Your next appointment:   6 month(s)  Provider:   Dr Alvis Ba  We recommend signing up for the patient portal called "MyChart".  Sign up information is provided on this After Visit Summary.  MyChart is used to connect with patients for Virtual Visits (Telemedicine).  Patients are able to view lab/test results, encounter notes, upcoming appointments, etc.  Non-urgent messages can be sent to your provider as well.   To learn more about what you can do with MyChart, go to [email or web address].

## 2023-10-25 NOTE — Telephone Encounter (Signed)
 I would continue HH if possible.  Given other recent BP readings, I would not change his meds yet.  Thanks .

## 2023-10-26 NOTE — Telephone Encounter (Signed)
 Orders verbalized to Amy

## 2023-10-26 NOTE — Telephone Encounter (Signed)
 Please verify with family- I thought patient was off januvia .  Thanks.

## 2023-10-27 ENCOUNTER — Other Ambulatory Visit: Payer: Self-pay | Admitting: Family Medicine

## 2023-10-27 DIAGNOSIS — T8454XD Infection and inflammatory reaction due to internal left knee prosthesis, subsequent encounter: Secondary | ICD-10-CM

## 2023-10-27 MED ORDER — AMOXICILLIN 500 MG PO CAPS: 500.0000 mg | ORAL_CAPSULE | Freq: Two times a day (BID) | ORAL | 1 refills | Status: DC

## 2023-11-01 ENCOUNTER — Ambulatory Visit (INDEPENDENT_AMBULATORY_CARE_PROVIDER_SITE_OTHER)

## 2023-11-01 DIAGNOSIS — I442 Atrioventricular block, complete: Secondary | ICD-10-CM | POA: Diagnosis not present

## 2023-11-01 LAB — CUP PACEART REMOTE DEVICE CHECK
Battery Remaining Longevity: 4 mo
Battery Remaining Percentage: 5 %
Battery Voltage: 2.83 V
Brady Statistic AP VP Percent: 11 %
Brady Statistic AP VS Percent: 1 %
Brady Statistic AS VP Percent: 89 %
Brady Statistic AS VS Percent: 1 %
Brady Statistic RA Percent Paced: 11 %
Brady Statistic RV Percent Paced: 99 %
Date Time Interrogation Session: 20250608020014
Implantable Lead Connection Status: 753985
Implantable Lead Connection Status: 753985
Implantable Lead Implant Date: 20160321
Implantable Lead Implant Date: 20160321
Implantable Lead Location: 753859
Implantable Lead Location: 753860
Implantable Pulse Generator Implant Date: 20160321
Lead Channel Impedance Value: 340 Ohm
Lead Channel Impedance Value: 350 Ohm
Lead Channel Pacing Threshold Amplitude: 1 V
Lead Channel Pacing Threshold Amplitude: 1 V
Lead Channel Pacing Threshold Pulse Width: 0.5 ms
Lead Channel Pacing Threshold Pulse Width: 0.8 ms
Lead Channel Sensing Intrinsic Amplitude: 12 mV
Lead Channel Sensing Intrinsic Amplitude: 2.6 mV
Lead Channel Setting Pacing Amplitude: 2.5 V
Lead Channel Setting Pacing Amplitude: 3 V
Lead Channel Setting Pacing Pulse Width: 0.5 ms
Lead Channel Setting Sensing Sensitivity: 8 mV
Pulse Gen Model: 2240
Pulse Gen Serial Number: 7734710

## 2023-11-05 ENCOUNTER — Ambulatory Visit: Payer: Self-pay | Admitting: Internal Medicine

## 2023-11-08 NOTE — Progress Notes (Signed)
 Remote pacemaker transmission.

## 2023-12-02 ENCOUNTER — Encounter

## 2023-12-02 ENCOUNTER — Ambulatory Visit: Payer: Self-pay | Admitting: Internal Medicine

## 2023-12-02 LAB — CUP PACEART REMOTE DEVICE CHECK
Battery Remaining Longevity: 7 mo
Battery Remaining Percentage: 10 %
Battery Voltage: 2.83 V
Brady Statistic AP VP Percent: 17 %
Brady Statistic AP VS Percent: 1 %
Brady Statistic AS VP Percent: 83 %
Brady Statistic AS VS Percent: 1 %
Brady Statistic RA Percent Paced: 16 %
Brady Statistic RV Percent Paced: 99 %
Date Time Interrogation Session: 20250710020537
Implantable Lead Connection Status: 753985
Implantable Lead Connection Status: 753985
Implantable Lead Implant Date: 20160321
Implantable Lead Implant Date: 20160321
Implantable Lead Location: 753859
Implantable Lead Location: 753860
Implantable Pulse Generator Implant Date: 20160321
Lead Channel Impedance Value: 340 Ohm
Lead Channel Impedance Value: 360 Ohm
Lead Channel Pacing Threshold Amplitude: 1 V
Lead Channel Pacing Threshold Amplitude: 1 V
Lead Channel Pacing Threshold Pulse Width: 0.5 ms
Lead Channel Pacing Threshold Pulse Width: 0.8 ms
Lead Channel Sensing Intrinsic Amplitude: 10.9 mV
Lead Channel Sensing Intrinsic Amplitude: 3.3 mV
Lead Channel Setting Pacing Amplitude: 2.5 V
Lead Channel Setting Pacing Amplitude: 3 V
Lead Channel Setting Pacing Pulse Width: 0.5 ms
Lead Channel Setting Sensing Sensitivity: 8 mV
Pulse Gen Model: 2240
Pulse Gen Serial Number: 7734710

## 2023-12-15 NOTE — Progress Notes (Signed)
 Remote pacemaker transmission.

## 2024-01-03 ENCOUNTER — Encounter

## 2024-01-03 LAB — CUP PACEART REMOTE DEVICE CHECK
Battery Remaining Longevity: 6 mo
Battery Remaining Percentage: 9 %
Battery Voltage: 2.81 V
Brady Statistic AP VP Percent: 14 %
Brady Statistic AP VS Percent: 1 %
Brady Statistic AS VP Percent: 86 %
Brady Statistic AS VS Percent: 1 %
Brady Statistic RA Percent Paced: 14 %
Brady Statistic RV Percent Paced: 99 %
Date Time Interrogation Session: 20250810020013
Implantable Lead Connection Status: 753985
Implantable Lead Connection Status: 753985
Implantable Lead Implant Date: 20160321
Implantable Lead Implant Date: 20160321
Implantable Lead Location: 753859
Implantable Lead Location: 753860
Implantable Pulse Generator Implant Date: 20160321
Lead Channel Impedance Value: 360 Ohm
Lead Channel Impedance Value: 440 Ohm
Lead Channel Pacing Threshold Amplitude: 1 V
Lead Channel Pacing Threshold Amplitude: 1 V
Lead Channel Pacing Threshold Pulse Width: 0.5 ms
Lead Channel Pacing Threshold Pulse Width: 0.8 ms
Lead Channel Sensing Intrinsic Amplitude: 3.3 mV
Lead Channel Sensing Intrinsic Amplitude: 8.3 mV
Lead Channel Setting Pacing Amplitude: 2.5 V
Lead Channel Setting Pacing Amplitude: 3 V
Lead Channel Setting Pacing Pulse Width: 0.5 ms
Lead Channel Setting Sensing Sensitivity: 8 mV
Pulse Gen Model: 2240
Pulse Gen Serial Number: 7734710

## 2024-01-05 ENCOUNTER — Ambulatory Visit: Payer: Self-pay | Admitting: Internal Medicine

## 2024-01-07 ENCOUNTER — Ambulatory Visit: Admitting: Family Medicine

## 2024-01-18 ENCOUNTER — Ambulatory Visit (INDEPENDENT_AMBULATORY_CARE_PROVIDER_SITE_OTHER): Payer: Medicare Other

## 2024-01-18 VITALS — BP 127/72 | Ht 71.0 in | Wt 178.0 lb

## 2024-01-18 DIAGNOSIS — Z0000 Encounter for general adult medical examination without abnormal findings: Secondary | ICD-10-CM | POA: Diagnosis not present

## 2024-01-18 NOTE — Patient Instructions (Signed)
 Mr. Kirker , Thank you for taking time out of your busy schedule to complete your Annual Wellness Visit with me. I enjoyed our conversation and look forward to speaking with you again next year. I, as well as your care team,  appreciate your ongoing commitment to your health goals. Please review the following plan we discussed and let me know if I can assist you in the future. Your Game plan/ To Do List    Referrals: If you haven't heard from the office you've been referred to, please reach out to them at the phone provided.   Follow up Visits: We will see or speak with you next year for your Next Medicare AWV with our clinical staff Have you seen your provider in the last 6 months (3 months if uncontrolled diabetes)? Yes  Clinician Recommendations:  Aim for 30 minutes of exercise or brisk walking, 6-8 glasses of water , and 5 servings of fruits and vegetables each day.       This is a list of the screenings recommended for you:  Health Maintenance  Topic Date Due   DTaP/Tdap/Td vaccine (2 - Tdap) 07/27/2018   Complete foot exam   12/19/2022   COVID-19 Vaccine (5 - 2024-25 season) 01/24/2023   Eye exam for diabetics  12/03/2023   Flu Shot  12/24/2023   Hemoglobin A1C  03/15/2024   Medicare Annual Wellness Visit  01/17/2025   Pneumococcal Vaccine for age over 65  Completed   Zoster (Shingles) Vaccine  Completed   HPV Vaccine  Aged Out   Meningitis B Vaccine  Aged Out    Advanced directives: (Declined) Advance directive discussed with you today. Even though you declined this today, please call our office should you change your mind, and we can give you the proper paperwork for you to fill out. Advance Care Planning is important because it:  [x]  Makes sure you receive the medical care that is consistent with your values, goals, and preferences  [x]  It provides guidance to your family and loved ones and reduces their decisional burden about whether or not they are making the right  decisions based on your wishes.  Follow the link provided in your after visit summary or read over the paperwork we have mailed to you to help you started getting your Advance Directives in place. If you need assistance in completing these, please reach out to us  so that we can help you!

## 2024-01-18 NOTE — Progress Notes (Signed)
 Subjective:   Dean Murphy is a 89 y.o. who presents for a Medicare Wellness preventive visit.  As a reminder, Annual Wellness Visits don't include a physical exam, and some assessments may be limited, especially if this visit is performed virtually. We may recommend an in-person follow-up visit with your provider if needed.  Visit Complete: Virtual I connected with  Dean Murphy on 01/18/24 by a audio enabled telemedicine application and verified that I am speaking with the correct person using two identifiers.  Patient Location: Home  Provider Location: Home Office  I discussed the limitations of evaluation and management by telemedicine. The patient expressed understanding and agreed to proceed.  Vital Signs: Because this visit was a virtual/telehealth visit, some criteria may be missing or patient reported. Any vitals not documented were not able to be obtained and vitals that have been documented are patient reported.  VideoDeclined- This patient declined Librarian, academic. Therefore the visit was completed with audio only.  Persons Participating in Visit: Patient.  AWV Questionnaire: No: Patient Medicare AWV questionnaire was not completed prior to this visit.  Cardiac Risk Factors include: advanced age (>65men, >68 women);diabetes mellitus;dyslipidemia;hypertension;male gender;sedentary lifestyle     Objective:    Today's Vitals   01/18/24 1132  BP: 127/72  Weight: 178 lb (80.7 kg)  Height: 5' 11 (1.803 m)  PainSc: 5    Body mass index is 24.83 kg/m.     01/18/2024   11:48 AM 07/02/2023    7:24 AM 07/01/2023   10:16 AM 01/11/2023   10:47 AM 08/10/2022    8:15 AM 08/07/2022    3:27 PM 01/27/2022   12:32 PM  Advanced Directives  Does Patient Have a Medical Advance Directive? No No No Yes No No No  Type of Theme park manager;Living will     Copy of Healthcare Power of Attorney in Chart?    No - copy requested      Would patient like information on creating a medical advance directive?  No - Patient declined   No - Patient declined No - Patient declined No - Patient declined    Current Medications (verified) Outpatient Encounter Medications as of 01/18/2024  Medication Sig   amLODipine  (NORVASC ) 5 MG tablet TAKE 1 TABLET (5 MG TOTAL) BY MOUTH DAILY.   amoxicillin  (AMOXIL ) 500 MG capsule Take 1 capsule (500 mg total) by mouth 2 (two) times daily.   B-D UF III MINI PEN NEEDLES 31G X 5 MM MISC USE DAILY WITH INSULIN  PEN   cholecalciferol (VITAMIN D3) 25 MCG (1000 UNIT) tablet Take 1,000 Units by mouth daily.   cyanocobalamin  (VITAMIN B12) 1000 MCG tablet Take 1 tablet (1,000 mcg total) by mouth daily.   dorzolamide-timolol (COSOPT) 22.3-6.8 MG/ML ophthalmic solution Place 1 drop into both eyes daily in the afternoon. In both eye   insulin  glargine (LANTUS  SOLOSTAR) 100 UNIT/ML Solostar Pen Inject 38 Units into the skin daily.   Iron , Ferrous Sulfate , 325 (65 Fe) MG TABS Take 325 mg by mouth 3 (three) times a week.   losartan  (COZAAR ) 100 MG tablet Take 100 mg by mouth at bedtime.   metoprolol  succinate (TOPROL  XL) 25 MG 24 hr tablet Take 1 tablet (25 mg total) by mouth at bedtime.   Multiple Vitamins-Minerals (PRESERVISION AREDS 2) CAPS Take 1 capsule by mouth daily.   pantoprazole  (PROTONIX ) 40 MG tablet TAKE 1 TABLET BY MOUTH TWICE A DAY   rosuvastatin  (CRESTOR ) 20  MG tablet Take 1 tablet (20 mg total) by mouth daily.   sodium zirconium cyclosilicate  (LOKELMA ) 10 g PACK packet Take 10 g by mouth 3 (three) times a week.   torsemide  (DEMADEX ) 10 MG tablet Take 1 tablet (10 mg total) by mouth 2 (two) times a week. Monday and Friday   vitamin C  (ASCORBIC ACID) 250 MG tablet Take 250 mg by mouth 3 (three) times a week. M-W-F   clotrimazole -betamethasone  (LOTRISONE ) cream APPLY TOPICALLY 2 TIMES DAILY AS NEEDED. (Patient not taking: Reported on 01/18/2024)   No facility-administered encounter medications on  file as of 01/18/2024.    Allergies (verified) Zoledronic  acid and Nsaids   History: Past Medical History:  Diagnosis Date   (HFpEF) heart failure with preserved ejection fraction (HCC) 07/2014   a.) initially Dx'd in 07/2014 in setting of CHB; b.) TTE 09/02/2017: EF 55-60%, apical septal HK, G1DD, sev LAE, mild MR, PASP 41; c.) TTE 12/25/2022: EF 60-65%, mild LVH, G1DD, mild AoV sclerosis with no stenosis   Anemia due to chronic blood loss    Aortic atherosclerosis (HCC)    Arthritis    AVM (arteriovenous malformation) of colon    a.)colonoscopy 08/10/2022: angioectasias (AVM) x 2 --> Tx'd with APC   Bilateral carotid artery disease (HCC)    a.) s/p BILATERAL carotid endarterectomies: RIGHT (12/31/2010) and LEFT (02/09/2011); b.) carotid doppler 05/13/2023: 1-39% BICA with disturbed flow from the RIGHT subclavian.   Blindness of right eye    CAD (coronary artery disease)    a.) s/p 5v CABG 1989; b.) MV 01/02/2011: EF 75%, no ischemia   Cerebral microvascular disease    CHB (complete heart block) (HCC) 07/2014   a.) s/p St. Jude Assurity dual chamber PPM 08/13/2014   Chronic BILATERAL L5 pars interarticularis defect    Chronic kidney disease, stage IV (severe) (HCC) 2015   Chronic prostatitis    Chronic radicular lumbar pain    Colon polyps    Complication of anesthesia    a.) PONV   Constipation    DDD (degenerative disc disease), thoracolumbar    Diverticulosis    DOE (dyspnea on exertion)    Dupuytren's disease of finger and palm of right hand    Essential hypertension    GERD (gastroesophageal reflux disease)    Gout    Hemorrhoids    Hiatal hernia    History of bilateral cataract extraction    HTN (hypertension)    Hyperlipidemia    Insomnia    Lacunar infarction (CVA) 01/01/2011   a,) noted on MRI brain 01/01/2011: remote lacunar infarcts of LEFT cerebellum and basal ganglia BILATERALLY   Myelolipoma of left adrenal gland    Myocardial infarction (HCC) 1976   a.)  type/details unknown   NSTEMI (non-ST elevated myocardial infarction) (HCC) 08/11/2014   a.) troponins were trended: 0.35 --> 0.54 --> 0.54 ng/mL   Orthostasis    Osteoporosis    PAF (paroxysmal atrial fibrillation) (HCC) 07/2014   a.) CHA2DS2-VASc = 8 (age x2, sex, HFpEF, HTN, CVA x2, vascular disease, T2DM) as of 07/01/2023; b.) cardiac rate/rhythm maintained on oral metoprolol  succinate; no OAC   PONV (postoperative nausea and vomiting)    Positive ANA (antinuclear antibody)    a.) workup with rheumatology (-) for SLE   Presence of permanent cardiac pacemaker 08/13/2014   a.) s/p St. Jude Assurity dual chamber PPM 08/13/2014 to LEFT infraclavicular deltopectoral region   PVD (peripheral vascular disease) (HCC)    Recurrent falls    Right inguinal  hernia    S/P CABG x 5 1989   Scoliosis    Symptomatic bradycardia    a.) s/p St. Jude Assurity dual chamber PPM 08/13/2014   Thoracic compression fracture (L1-L2)    a.) s/p L1 balloon kyphoplasty 08/13/2022   Thrombocytopenia (HCC)    TMJ (dislocation of temporomandibular joint)    Type 2 diabetes mellitus treated with insulin  [facility])    Past Surgical History:  Procedure Laterality Date   CARDIAC CATHETERIZATION  08/11/2014   Procedure: TEMPORARY PACEMAKER;  Surgeon: Dorn JINNY Lesches, MD;  Location: [facility] CATH LAB;  Service: Cardiovascular;;   CAROTID ENDARTERECTOMY Right 12/31/2010   CAROTID ENDARTERECTOMY Left 02/09/2011   CATARACT EXTRACTION W/ INTRAOCULAR LENS IMPLANT Bilateral    CHOLECYSTECTOMY     COLONOSCOPY WITH PROPOFOL  N/A 08/10/2022   Procedure: COLONOSCOPY WITH PROPOFOL ;  Surgeon: Shila Gustav GAILS, MD;  Location: [facility] ENDOSCOPY;  Service: Gastroenterology;  Laterality: N/A;   CORONARY ARTERY BYPASS GRAFT  05/26/1987   x 5 vessels   ENDOSCOPIC RETROGRADE CHOLANGIOPANCREATOGRAPHY (ERCP) WITH PROPOFOL  N/A 01/27/2022   Procedure: ENDOSCOPIC RETROGRADE CHOLANGIOPANCREATOGRAPHY (ERCP) WITH PROPOFOL ;  Surgeon: Aneita Gwendlyn DASEN,  MD;  Location: [facility] ENDOSCOPY;  Service: Gastroenterology;  Laterality: N/A;   ESOPHAGOGASTRODUODENOSCOPY N/A 08/09/2022   Procedure: ESOPHAGOGASTRODUODENOSCOPY (EGD);  Surgeon: Legrand Victory LITTIE DOUGLAS, MD;  Location: [facility] ENDOSCOPY;  Service: Gastroenterology;  Laterality: N/A;   HOT HEMOSTASIS N/A 08/10/2022   Procedure: HOT HEMOSTASIS (ARGON PLASMA COAGULATION/BICAP);  Surgeon: Nandigam, Kavitha V, MD;  Location: [facility] ENDOSCOPY;  Service: Gastroenterology;  Laterality: N/A;   I & D KNEE WITH POLY EXCHANGE Left 07/29/2015   Procedure: IRRIGATION AND DEBRIDEMENT LEFT KNEE WITH POLY EXCHANGE;  Surgeon: Redell Shoals, MD;  Location: [facility] OR;  Service: Orthopedics;  Laterality: Left;   INGUINAL HERNIA REPAIR Right 07/02/2023   Procedure: HERNIA REPAIR INGUINAL ADULT, open, RNFA to assist;  Surgeon: Jordis Laneta FALCON, MD;  Location: [facility] ORS;  Service: General;  Laterality: Right;   IR GENERIC HISTORICAL  03/04/2016   IR US  GUIDE VASC ACCESS RIGHT 03/04/2016 Rome Hall, MD [facility]-INTER[facility] RAD   IR GENERIC HISTORICAL  03/04/2016   IR FLUORO GUIDE CV LINE RIGHT 03/04/2016 Rome Hall, MD [facility]   IR KYPHO LUMBAR INC FX REDUCE BONE BX UNI/BIL CANNULATION INC/IMAGING  08/13/2022   PERMANENT PACEMAKER INSERTION N/A 08/13/2014   STJ dual chamber pacemaker implanted by Dr Waddell for CHB   REMOVAL OF STONES  01/27/2022   Procedure: REMOVAL OF STONES;  Surgeon: Aneita Gwendlyn DASEN, MD;  Location: [facility];  Service: Gastroenterology;;   ANNETT  01/27/2022   Procedure: ANNETT;  Surgeon: Aneita Gwendlyn DASEN, MD;  Location: [facility];  Service: Gastroenterology;;   TONSILLECTOMY     TOTAL KNEE ARTHROPLASTY Bilateral    Family History  Problem Relation Age of Onset   Heart disease Mother        Before age 59   Diabetes Mother    Varicose Veins Mother    Heart attack Mother    Heart attack Father    Heart disease Father        After age 49   Hypertension Father    Heart disease Brother         Before age 27   Hypertension Brother    Heart attack Brother    Diabetes Son    Hypertension Son    Hypertension Son    Diabetes Son    Diabetes Other    Cancer Other    Colon cancer  Neg Hx    Social History   Socioeconomic History   Marital status: Married    Spouse name: Avelina   Number of children: 4   Years of education: Not on file   Highest education level: Not on file  Occupational History   Occupation: retired    Associate Professor: RETIRED  Tobacco Use   Smoking status: Former    Current packs/day: 0.00    Average packs/day: 1 pack/day for 25.0 years (25.0 ttl pk-yrs)    Types: Cigarettes    Start date: 01/22/1948    Quit date: 01/21/1973    Years since quitting: 51.0    Passive exposure: Past   Smokeless tobacco: Never   Tobacco comments:    began smoking in high school, quit age 30  Vaping Use   Vaping status: Never Used  Substance and Sexual Activity   Alcohol  use: Yes    Comment: wine occassional   Drug use: No   Sexual activity: Never  Other Topics Concern   Not on file  Social History Narrative   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  Remarried 1998.     3 sons   Social Drivers of Corporate investment banker Strain: Low Risk  (01/18/2024)   Overall Financial Resource Strain (CARDIA)    Difficulty of Paying Living Expenses: Not hard at all  Food Insecurity: No Food Insecurity (01/18/2024)   Hunger Vital Sign    Worried About Running Out of Food in the Last Year: Never true    Ran Out of Food in the Last Year: Never true  Transportation Needs: No Transportation Needs (01/18/2024)   PRAPARE - Administrator, Civil Service (Medical): No    Lack of Transportation (Non-Medical): No  Physical Activity: Inactive (01/18/2024)   Exercise Vital Sign    Days of Exercise per Week: 0 days    Minutes of Exercise per Session: 0 min  Stress: No Stress Concern Present (01/18/2024)   [employer] of Occupational  Health - Occupational Stress Questionnaire    Feeling of Stress: Not at all  Social Connections: Moderately Integrated (01/18/2024)   Social Connection and Isolation Panel    Frequency of Communication with Friends and Family: More than three times a week    Frequency of Social Gatherings with Friends and Family: More than three times a week    Attends Religious Services: More than 4 times per year    Active Member of [employer] or Organizations: No    Attends Engineer, structural: Never    Marital Status: Married    Tobacco Counseling Counseling given: Not Answered Tobacco comments: began smoking in high school, quit age 44    Clinical Intake:  Pre-visit preparation completed: Yes  Pain : 0-10 Pain Score: 5  Pain Type: Chronic pain Pain Location: Knee Pain Orientation: Left Pain Descriptors / Indicators: Aching Pain Onset: More than a month ago Pain Frequency: Intermittent Pain Relieving Factors: Tylenol   Pain Relieving Factors: Tylenol   BMI - recorded: 24.83 Nutritional Status: BMI of 19-24  Normal Nutritional Risks: None Diabetes: Yes CBG done?: Yes (pt BS 95 this am at home) CBG resulted in Enter/ Edit results?: No Did pt. bring in CBG monitor from home?: No  Lab Results  Component Value Date   HGBA1C 8.9 (H) 09/14/2023   HGBA1C 10.1 (H) 05/11/2023   HGBA1C 13.4 (H) 12/03/2022     How often do you need to  have someone help you when you read instructions, pamphlets, or other written materials from your doctor or pharmacy?: 1 - Never  Interpreter Needed?: No  Comments: lives wife Information entered by :: B.Rodolphe Edmonston,LPN   Activities of Daily Living     01/18/2024   11:48 AM 07/01/2023   10:43 AM  In your present state of health, do you have any difficulty performing the following activities:  Hearing? 0   Vision? 0   Difficulty concentrating or making decisions? 0   Walking or climbing stairs? 1   Dressing or bathing? 0   Doing errands, shopping?  0 0  Preparing Food and eating ? N   Using the Toilet? N   In the past six months, have you accidently leaked urine? N   Do you have problems with loss of bowel control? N   Managing your Medications? N   Managing your Finances? N   Housekeeping or managing your Housekeeping? N     Patient Care Team: Cleatus Arlyss RAMAN, MD as PCP - General (Family Medicine) Waddell Danelle ORN, MD as PCP - Electrophysiology (Cardiology) Dennise Capri, MD (Internal Medicine) Charmayne Arlyss, MD as Consulting Physician (Ophthalmology) Toribio Lesches, DDS as Referring Physician (Dentistry) Joshua Toribio, MD as Consulting Physician (Dermatology) Fidel Rogue, MD as Consulting Physician (Orthopedic Surgery) Waddell Danelle ORN, MD as Consulting Physician (Cardiology) Cleatus Arlyss RAMAN, MD as Consulting Physician (Family Medicine) Croitoru, Jerel, MD as Consulting Physician (Cardiology)  I have updated your Care Teams any recent Medical Services you may have received from other providers in the past year.     Assessment:   This is a routine wellness examination for Dean Murphy.  Hearing/Vision screen Hearing Screening - Comments:: Patient denies any hearing difficulties.   Vision Screening - Comments:: Pt says their vision is good with glasses Dr  KANDICE Lyles-next visit 02/18/24   Goals Addressed             This Visit's Progress    COMPLETED: Increase water  intake       Starting 07/07/2018, I will continue to drink at least 8 oz water  with each meal daily.      COMPLETED: Patient Stated       07/12/2019, I will maintain and continue medications as prescribed.     Patient Stated   On track    01/18/24- I will maintain and continue medications as prescribed.      COMPLETED: Patient Stated       12/15/2021, no goals     Patient Stated       01/18/24-Stay active and alive       Depression Screen     01/18/2024   11:44 AM 09/14/2023    9:48 AM 06/17/2023   11:43 AM 05/28/2023    9:59 AM 01/11/2023   10:38 AM  12/03/2022   12:22 PM 08/20/2022    9:35 AM  PHQ 2/9 Scores  PHQ - 2 Score 0 0 0 1 0 0 0  PHQ- 9 Score   1 10 0 1 4    Fall Risk     01/18/2024   11:37 AM 09/14/2023    9:48 AM 06/17/2023   11:43 AM 05/28/2023    9:59 AM 01/11/2023   10:41 AM  Fall Risk   Falls in the past year? 0 1 1 0 1  Number falls in past yr: 0 0 0 0 0  Injury with Fall? 0 0 1 0 1  Comment     bruised hand  Risk for fall due to : Impaired mobility;Impaired balance/gait No Fall Risks History of fall(s) No Fall Risks Impaired balance/gait  Risk for fall due to: Comment     uses cane  Follow up Education provided;Falls prevention discussed Falls evaluation completed Falls evaluation completed Falls evaluation completed Falls prevention discussed;Falls evaluation completed;Education provided    MEDICARE RISK AT HOME:  Medicare Risk at Home Any stairs in or around the home?: Yes If so, are there any without handrails?: Yes Home free of loose throw rugs in walkways, pet beds, electrical cords, etc?: Yes Adequate lighting in your home to reduce risk of falls?: Yes Life alert?: Yes (has one for when in yard or my himself) Use of a cane, walker or w/c?: Yes Grab bars in the bathroom?: Yes Shower chair or bench in shower?: Yes Elevated toilet seat or a handicapped toilet?: Yes  TIMED UP AND GO:  Was the test performed?  No  Cognitive Function: 6CIT completed    12/12/2020    3:02 PM 07/12/2019    9:11 AM 07/07/2018    8:07 AM 06/04/2016    1:29 PM  MMSE - Mini Mental State Exam  Orientation to time 5 4 5 5    Orientation to Place 5 5 5 5    Registration 3 3 3 3    Attention/ Calculation 5 0 0 0   Recall 3 3 3 3    Language- name 2 objects   0 0   Language- repeat 1 1 1 1   Language- follow 3 step command   3 3   Language- read & follow direction   0 0   Write a sentence   0 0   Copy design   0 0   Total score   20 20      Data saved with a previous flowsheet row definition        01/18/2024   11:49 AM  01/11/2023   10:51 AM 12/15/2021   10:52 AM  6CIT Screen  What Year? 0 points 0 points 0 points  What month? 0 points 0 points 0 points  What time? 0 points 0 points 0 points  Count back from 20 0 points 0 points 0 points  Months in reverse 2 points 0 points 4 points  Repeat phrase 0 points 0 points 2 points  Total Score 2 points 0 points 6 points    Immunizations Immunization History  Administered Date(s) Administered   Fluad Quad(high Dose 65+) 01/20/2019, 02/06/2022   Fluad Trivalent(High Dose 65+) 01/26/2023   INFLUENZA, HIGH DOSE SEASONAL PF 03/27/2013, 02/17/2018, 03/08/2020, 03/05/2021   Influenza Split 02/03/2012   Influenza Whole 03/16/2007, 01/23/2010   Influenza,inj,Quad PF,6+ Mos 03/15/2014, 03/15/2015, 02/27/2016   Influenza-Unspecified 03/19/2017, 02/17/2018   Moderna SARS-COV2 Booster Vaccination 04/26/2020   Moderna Sars-Covid-2 Vaccination 06/07/2019, 07/05/2019   Pfizer Covid-19 Vaccine Bivalent Booster 23yrs & up 04/23/2021   Pneumococcal Conjugate-13 06/24/2015   Pneumococcal Polysaccharide-23 09/24/2009   Td 07/26/2008   Zoster Recombinant(Shingrix) 01/06/2021, 04/14/2021    Screening Tests Health Maintenance  Topic Date Due   DTaP/Tdap/Td (2 - Tdap) 07/27/2018   FOOT EXAM  12/19/2022   COVID-19 Vaccine (5 - 2024-25 season) 01/24/2023   OPHTHALMOLOGY EXAM  12/03/2023   INFLUENZA VACCINE  12/24/2023   HEMOGLOBIN A1C  03/15/2024   Medicare Annual Wellness (AWV)  01/17/2025   Pneumococcal Vaccine: 50+ Years  Completed   Zoster Vaccines- Shingrix  Completed   HPV VACCINES  Aged Out   Meningococcal B Vaccine  Aged Out    Health Maintenance  Health Maintenance Due  Topic Date Due   DTaP/Tdap/Td (2 - Tdap) 07/27/2018   FOOT EXAM  12/19/2022   COVID-19 Vaccine (5 - 2024-25 season) 01/24/2023   OPHTHALMOLOGY EXAM  12/03/2023   INFLUENZA VACCINE  12/24/2023   Health Maintenance Items Addressed: None at this time  Additional Screening:  Vision  Screening: Recommended annual ophthalmology exams for early detection of glaucoma and other disorders of the eye. Would you like a referral to an eye doctor? No    Dental Screening: Recommended annual dental exams for proper oral hygiene  Community Resource Referral / Chronic Care Management: CRR required this visit?  No   CCM required this visit?  No   Plan:    I have personally reviewed and noted the following in the patient's chart:   Medical and social history Use of alcohol , tobacco or illicit drugs  Current medications and supplements including opioid prescriptions. Patient is not currently taking opioid prescriptions. Functional ability and status Nutritional status Physical activity Advanced directives List of other physicians Hospitalizations, surgeries, and ER visits in previous 12 months Vitals Screenings to include cognitive, depression, and falls Referrals and appointments  In addition, I have reviewed and discussed with patient certain preventive protocols, quality metrics, and best practice recommendations. A written personalized care plan for preventive services as well as general preventive health recommendations were provided to patient.   Dean LITTIE Saris, LPN   [id number]/[id number]   After Visit Summary: (MyChart) Due to this being a telephonic visit, the after visit summary with patients personalized plan was offered to patient via MyChart   Notes: Nothing significant to report at this time.

## 2024-01-20 ENCOUNTER — Encounter: Payer: Self-pay | Admitting: Family Medicine

## 2024-01-20 ENCOUNTER — Ambulatory Visit: Admitting: Family Medicine

## 2024-01-20 VITALS — BP 146/72 | HR 60 | Temp 97.2°F | Ht 71.0 in | Wt 184.0 lb

## 2024-01-20 DIAGNOSIS — E1169 Type 2 diabetes mellitus with other specified complication: Secondary | ICD-10-CM

## 2024-01-20 DIAGNOSIS — I10 Essential (primary) hypertension: Secondary | ICD-10-CM | POA: Diagnosis not present

## 2024-01-20 DIAGNOSIS — N183 Chronic kidney disease, stage 3 unspecified: Secondary | ICD-10-CM

## 2024-01-20 DIAGNOSIS — E1122 Type 2 diabetes mellitus with diabetic chronic kidney disease: Secondary | ICD-10-CM

## 2024-01-20 DIAGNOSIS — T8454XD Infection and inflammatory reaction due to internal left knee prosthesis, subsequent encounter: Secondary | ICD-10-CM

## 2024-01-20 DIAGNOSIS — E785 Hyperlipidemia, unspecified: Secondary | ICD-10-CM

## 2024-01-20 DIAGNOSIS — Z7189 Other specified counseling: Secondary | ICD-10-CM

## 2024-01-20 DIAGNOSIS — R202 Paresthesia of skin: Secondary | ICD-10-CM | POA: Diagnosis not present

## 2024-01-20 DIAGNOSIS — Z0000 Encounter for general adult medical examination without abnormal findings: Secondary | ICD-10-CM

## 2024-01-20 LAB — HEPATIC FUNCTION PANEL
ALT: 46 U/L (ref 0–53)
AST: 48 U/L — ABNORMAL HIGH (ref 0–37)
Albumin: 3.9 g/dL (ref 3.5–5.2)
Alkaline Phosphatase: 193 U/L — ABNORMAL HIGH (ref 39–117)
Bilirubin, Direct: 0.1 mg/dL (ref 0.0–0.3)
Total Bilirubin: 0.4 mg/dL (ref 0.2–1.2)
Total Protein: 6.3 g/dL (ref 6.0–8.3)

## 2024-01-20 LAB — LIPID PANEL
Cholesterol: 123 mg/dL (ref 0–200)
HDL: 46.6 mg/dL (ref >39.00)
LDL Cholesterol: 45 mg/dL (ref 0–99)
NonHDL: 75.95
Total CHOL/HDL Ratio: 3
Triglycerides: 154 mg/dL — ABNORMAL HIGH (ref 0.0–149.0)
VLDL: 30.8 mg/dL (ref 0.0–40.0)

## 2024-01-20 LAB — VITAMIN B12: Vitamin B-12: 1143 pg/mL — ABNORMAL HIGH (ref 211–911)

## 2024-01-20 LAB — SEDIMENTATION RATE: Sed Rate: 20 mm/h (ref 0–20)

## 2024-01-20 LAB — C-REACTIVE PROTEIN: CRP: <1 mg/dL (ref 0.5–20.0)

## 2024-01-20 LAB — TSH: TSH: 2.74 u[IU]/mL (ref 0.35–5.50)

## 2024-01-20 LAB — HEMOGLOBIN A1C: Hgb A1c MFr Bld: 9.4 % — ABNORMAL HIGH (ref 4.6–6.5)

## 2024-01-20 NOTE — Progress Notes (Signed)
 Still on BID amoxil  at baseline.  Knee pain is better from prev flares.  Not red locally.  No fevers.  D/w pt about continuing amoxil .    Hypertension:    Using medication without problems or lightheadedness: yes Chest pain with exertion:no Edema: some L leg swelling at baseline.   Short of breath:no Labs d/w pt.    CKD per renal clinic.  He had itching with taking lokelma .  Off that med now.  Med list updated.  Low K diet handout given to patient and discussed.    Diabetes:  Using medications without difficulties: yes Hypoglycemic episodes:no Hyperglycemic episodes:no Feet problems:some altered sensation.   Blood Sugars averaging: usually 90s recently.   eye exam within last year: due, scheduled soon.   Elevated Cholesterol: Using medications without problems:yes Muscle aches:  no  Diet compliance: d/w pt.  Exercise: d/w pt He has lower appetite at baseline but eats regularly.    Sons Fairy and Primitivo equally designated if patient were incapacitated.   Vaccines d/w pt.   covid vaccine done RSV d/w pt.   shingrix prev done.   Defer colon and prostate cancer screening at this point, patient agrees.    Meds, vitals, and allergies reviewed.   ROS: Per HPI unless specifically indicated in ROS section   GEN: nad, alert and oriented HEENT: mucous membranes moist NECK: supple w/o LA CV: rrr.  no murmur PULM: ctab, no inc wob ABD: soft, +bs EXT: trace BLE L>R edema SKIN: no acute rash  Diabetic foot exam: Normal inspection No skin breakdown No calluses  Normal DP pulses dec sensation to light touch and monofilament Nails normal

## 2024-01-20 NOTE — Telephone Encounter (Signed)
 Removed losartan  from pt's med list. Fyi to Dr Cleatus.

## 2024-01-20 NOTE — Patient Instructions (Signed)
 Go to the lab on the way out.   If you have mychart we'll likely use that to update you.    I would get a flu shot each fall.   Recheck in about 6 months. Labs at the visit.  You don't need to fast.  Update me as needed.

## 2024-01-24 ENCOUNTER — Ambulatory Visit: Payer: Self-pay | Admitting: Family Medicine

## 2024-01-24 DIAGNOSIS — E1169 Type 2 diabetes mellitus with other specified complication: Secondary | ICD-10-CM | POA: Insufficient documentation

## 2024-01-24 LAB — FRUCTOSAMINE: Fructosamine: 392 umol/L — ABNORMAL HIGH (ref 205–285)

## 2024-01-24 NOTE — Assessment & Plan Note (Signed)
 Sons Fairy and Carolos equally designated if patient were incapacitated.   Vaccines d/w pt.   covid vaccine done RSV d/w pt.   shingrix prev done.   Defer colon and prostate cancer screening at this point, patient agrees.

## 2024-01-24 NOTE — Assessment & Plan Note (Signed)
 Sons Fairy and Nghia equally designated if patient were incapacitated.

## 2024-01-24 NOTE — Assessment & Plan Note (Signed)
 Continue Amoxil .  Will follow-up with infectious disease clinic.  See notes on labs.

## 2024-01-24 NOTE — Assessment & Plan Note (Signed)
 Per renal clinic. He had itching with taking lokelma .  Off that med now.  Med list updated.  Low K diet handout given to patient and discussed.

## 2024-01-24 NOTE — Assessment & Plan Note (Signed)
 Continue amlodipine  metoprolol  torsemide .  Labs discussed with patient.

## 2024-01-24 NOTE — Assessment & Plan Note (Signed)
Continue Crestor.  See notes on labs. 

## 2024-01-24 NOTE — Assessment & Plan Note (Signed)
 See notes on labs.  Continue insulin  as is.  Recheck periodically.

## 2024-02-03 ENCOUNTER — Ambulatory Visit (INDEPENDENT_AMBULATORY_CARE_PROVIDER_SITE_OTHER)

## 2024-02-03 DIAGNOSIS — I442 Atrioventricular block, complete: Secondary | ICD-10-CM | POA: Diagnosis not present

## 2024-02-03 LAB — CUP PACEART REMOTE DEVICE CHECK
Battery Remaining Longevity: 5 mo
Battery Remaining Percentage: 7 %
Battery Voltage: 2.78 V
Brady Statistic AP VP Percent: 17 %
Brady Statistic AP VS Percent: 1 %
Brady Statistic AS VP Percent: 83 %
Brady Statistic AS VS Percent: 1 %
Brady Statistic RA Percent Paced: 17 %
Brady Statistic RV Percent Paced: 99 %
Date Time Interrogation Session: 20250910020016
Implantable Lead Connection Status: 753985
Implantable Lead Connection Status: 753985
Implantable Lead Implant Date: 20160321
Implantable Lead Implant Date: 20160321
Implantable Lead Location: 753859
Implantable Lead Location: 753860
Implantable Pulse Generator Implant Date: 20160321
Lead Channel Impedance Value: 280 Ohm
Lead Channel Impedance Value: 350 Ohm
Lead Channel Pacing Threshold Amplitude: 1 V
Lead Channel Pacing Threshold Amplitude: 1 V
Lead Channel Pacing Threshold Pulse Width: 0.5 ms
Lead Channel Pacing Threshold Pulse Width: 0.8 ms
Lead Channel Sensing Intrinsic Amplitude: 3.4 mV
Lead Channel Sensing Intrinsic Amplitude: 8.3 mV
Lead Channel Setting Pacing Amplitude: 2.5 V
Lead Channel Setting Pacing Amplitude: 3 V
Lead Channel Setting Pacing Pulse Width: 0.5 ms
Lead Channel Setting Sensing Sensitivity: 8 mV
Pulse Gen Model: 2240
Pulse Gen Serial Number: 7734710

## 2024-02-11 ENCOUNTER — Ambulatory Visit: Payer: Self-pay | Admitting: Internal Medicine

## 2024-02-11 NOTE — Progress Notes (Signed)
 Remote PPM Transmission

## 2024-02-23 ENCOUNTER — Other Ambulatory Visit: Payer: Self-pay | Admitting: Family Medicine

## 2024-03-06 ENCOUNTER — Encounter

## 2024-03-06 LAB — CUP PACEART REMOTE DEVICE CHECK
Battery Remaining Longevity: 4 mo
Battery Remaining Percentage: 6 %
Battery Voltage: 2.77 V
Brady Statistic AP VP Percent: 19 %
Brady Statistic AP VS Percent: 1 %
Brady Statistic AS VP Percent: 81 %
Brady Statistic AS VS Percent: 1 %
Brady Statistic RA Percent Paced: 19 %
Brady Statistic RV Percent Paced: 99 %
Date Time Interrogation Session: 20251013020022
Implantable Lead Connection Status: 753985
Implantable Lead Connection Status: 753985
Implantable Lead Implant Date: 20160321
Implantable Lead Implant Date: 20160321
Implantable Lead Location: 753859
Implantable Lead Location: 753860
Implantable Pulse Generator Implant Date: 20160321
Lead Channel Impedance Value: 360 Ohm
Lead Channel Impedance Value: 450 Ohm
Lead Channel Pacing Threshold Amplitude: 1 V
Lead Channel Pacing Threshold Amplitude: 1 V
Lead Channel Pacing Threshold Pulse Width: 0.5 ms
Lead Channel Pacing Threshold Pulse Width: 0.8 ms
Lead Channel Sensing Intrinsic Amplitude: 3 mV
Lead Channel Sensing Intrinsic Amplitude: 9 mV
Lead Channel Setting Pacing Amplitude: 2.5 V
Lead Channel Setting Pacing Amplitude: 3 V
Lead Channel Setting Pacing Pulse Width: 0.5 ms
Lead Channel Setting Sensing Sensitivity: 8 mV
Pulse Gen Model: 2240
Pulse Gen Serial Number: 7734710

## 2024-03-09 ENCOUNTER — Ambulatory Visit: Payer: Self-pay | Admitting: Internal Medicine

## 2024-04-06 ENCOUNTER — Ambulatory Visit: Attending: Internal Medicine

## 2024-04-06 ENCOUNTER — Encounter

## 2024-04-06 LAB — CUP PACEART REMOTE DEVICE CHECK
Battery Remaining Longevity: 3 mo
Battery Remaining Percentage: 4 %
Battery Voltage: 2.74 V
Brady Statistic AP VP Percent: 23 %
Brady Statistic AP VS Percent: 1 %
Brady Statistic AS VP Percent: 77 %
Brady Statistic AS VS Percent: 1 %
Brady Statistic RA Percent Paced: 22 %
Brady Statistic RV Percent Paced: 99 %
Date Time Interrogation Session: 20251113020014
Implantable Lead Connection Status: 753985
Implantable Lead Connection Status: 753985
Implantable Lead Implant Date: 20160321
Implantable Lead Implant Date: 20160321
Implantable Lead Location: 753859
Implantable Lead Location: 753860
Implantable Pulse Generator Implant Date: 20160321
Lead Channel Impedance Value: 290 Ohm
Lead Channel Impedance Value: 350 Ohm
Lead Channel Pacing Threshold Amplitude: 1 V
Lead Channel Pacing Threshold Amplitude: 1 V
Lead Channel Pacing Threshold Pulse Width: 0.5 ms
Lead Channel Pacing Threshold Pulse Width: 0.8 ms
Lead Channel Sensing Intrinsic Amplitude: 2.6 mV
Lead Channel Sensing Intrinsic Amplitude: 9 mV
Lead Channel Setting Pacing Amplitude: 2.5 V
Lead Channel Setting Pacing Amplitude: 3 V
Lead Channel Setting Pacing Pulse Width: 0.5 ms
Lead Channel Setting Sensing Sensitivity: 8 mV
Pulse Gen Model: 2240
Pulse Gen Serial Number: 7734710

## 2024-04-09 ENCOUNTER — Ambulatory Visit: Payer: Self-pay | Admitting: Internal Medicine

## 2024-05-01 ENCOUNTER — Ambulatory Visit: Attending: Cardiovascular Disease | Admitting: Cardiovascular Disease

## 2024-05-01 VITALS — BP 138/50 | HR 58 | Ht 71.0 in | Wt 181.8 lb

## 2024-05-01 DIAGNOSIS — I5032 Chronic diastolic (congestive) heart failure: Secondary | ICD-10-CM

## 2024-05-01 DIAGNOSIS — I442 Atrioventricular block, complete: Secondary | ICD-10-CM

## 2024-05-01 DIAGNOSIS — I2510 Atherosclerotic heart disease of native coronary artery without angina pectoris: Secondary | ICD-10-CM

## 2024-05-01 DIAGNOSIS — I10 Essential (primary) hypertension: Secondary | ICD-10-CM

## 2024-05-01 DIAGNOSIS — I480 Paroxysmal atrial fibrillation: Secondary | ICD-10-CM

## 2024-05-01 DIAGNOSIS — Z950 Presence of cardiac pacemaker: Secondary | ICD-10-CM

## 2024-05-01 NOTE — Patient Instructions (Signed)
 Medication Instructions:  No changes *If you need a refill on your cardiac medications before your next appointment, please call your pharmacy*  Lab Work: None ordered If you have labs (blood work) drawn today and your tests are completely normal, you will receive your results only by: MyChart Message (if you have MyChart) OR A paper copy in the mail If you have any lab test that is abnormal or we need to change your treatment, we will call you to review the results.  Testing/Procedures: None ordered  Follow-Up: At [facility], you and your health needs are our priority.  As part of our continuing mission to provide you with exceptional heart care, our providers are all part of one team.  This team includes your primary Cardiologist (physician) and Advanced Practice Providers or APPs (Physician Assistants and Nurse Practitioners) who all work together to provide you with the care you need, when you need it.  Your next appointment:    07/13/24 at 10:00   Provider:   Dr Francyne  We recommend signing up for the patient portal called MyChart.  Sign up information is provided on this After Visit Summary.  MyChart is used to connect with patients for Virtual Visits (Telemedicine).  Patients are able to view lab/test results, encounter notes, upcoming appointments, etc.  Non-urgent messages can be sent to your provider as well.   To learn more about what you can do with MyChart, go to [email or web address].

## 2024-05-01 NOTE — Progress Notes (Signed)
 Cardiology Office Note:    Date:  05/07/2024   ID:  Dean Murphy, DOB 08-31-1926, MRN [id number]  PCP:  Cleatus Arlyss RAMAN, MD   [facility] Providers Cardiologist:  None Electrophysiologist:  Danelle Birmingham, MD     Referring MD: Cleatus Arlyss RAMAN, MD   Chief Complaint  Patient presents with   Pacemaker Check    History of Present Illness:    Dean Murphy is a 89 y.o. male with a hx of multiple cardiovascular problems including coronary artery disease (previous MI, CABG 1989), history of stroke and bilateral carotid stenosis status post endarterectomy (2012), complete heart block s/p St Jude dual-chamber permanent pacemaker (Dr. Birmingham, 2016), paroxysmal atrial fibrillation, HFpEF (primarily manifest in the setting of heart block), as well as CKD stage IV (most recent creatinine 2.7, Dr. 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  His wife Avelina is also my patient.   As always, Mr. Daubert is very positive and optimistic and reports that he feels great.  He has no complaints.  He feels he has plenty of energy.  He has not had any new falls or recent severe bleeding problems, but due to bleeding complications his Eliquis  was discontinued permanently last year.  He has no history of stroke, TIA or other embolic events.  The patient specifically denies any chest pain at rest or with exertion, dyspnea at rest or with exertion, orthopnea, paroxysmal nocturnal dyspnea, syncope, palpitations, focal neurological deficits, intermittent claudication, lower extremity edema, unexplained weight gain, cough, hemoptysis or wheezing.   He is pacemaker dependent due to complete heart block.  Presenting rhythm is atrial sensed, ventricular paced.  Device function is normal, but ERI is anticipated in less than 3 months.  Lead parameters are stable (both leads are St Jude  1688 TC tendril).  He has roughly 23% atrial pacing and 99% ventricular pacing.  He has not had any atrial fibrillation since March 2025.  He has had a few very brief episodes of paroxysmal atrial tachycardia, less than 20 seconds in duration.  The heart rate histogram distribution is fair.  There have been no episodes of high ventricular rate.  Metabolic control is great, but has before his diabetes control is poor.  Most recent HDL 46, LDL 45, triglycerides 154, but hemoglobin A1c 9.4%.  He has a weakness for sweets and he makes it very clear that at age 33 he will not restrict himself: That he will have Cheerwine if he wants it or he will eat dessert when he has a craving.  Renal function has worsened slightly with the most recent creatinine at 3.11.  Potassium was 4.6.  His most recent echocardiogram from 12/25/2022 showed normal left ventricular systolic function with EF 60 to 65% and without wall motion abnormalities (an older echo has suggested apical septal hypokinesis).  He has grade 1 diastolic dysfunction and mild aortic sclerosis without stenosis.  Duplex ultrasonography showed minor bilateral carotid plaque in December 2024.  Past Medical History:  Diagnosis Date   (HFpEF) heart failure with preserved ejection fraction (HCC) 07/2014   a.) initially Dx'd in 07/2014 in setting of CHB; b.) TTE 09/02/2017: EF 55-60%, apical septal HK, G1DD, sev LAE, mild MR, PASP 41; c.) TTE 12/25/2022: EF 60-65%, mild LVH, G1DD, mild AoV sclerosis with no stenosis   Anemia due to chronic blood loss    Aortic atherosclerosis  Arthritis    AVM (arteriovenous malformation) of colon    a.)colonoscopy 08/10/2022: angioectasias (AVM) x 2 --> Tx'd with APC   Bilateral carotid artery disease    a.) s/p BILATERAL carotid endarterectomies: RIGHT (12/31/2010) and LEFT (02/09/2011); b.) carotid doppler 05/13/2023: 1-39% BICA with disturbed flow from the RIGHT subclavian.   Blindness of right eye    CAD (coronary  artery disease)    a.) s/p 5v CABG 1989; b.) MV 01/02/2011: EF 75%, no ischemia   Cerebral microvascular disease    CHB (complete heart block) (HCC) 07/2014   a.) s/p St. Jude Assurity dual chamber PPM 08/13/2014   Chronic BILATERAL L5 pars interarticularis defect    Chronic kidney disease, stage IV (severe) (HCC) 2015   Chronic prostatitis    Chronic radicular lumbar pain    Colon polyps    Complication of anesthesia    a.) PONV   Constipation    DDD (degenerative disc disease), thoracolumbar    Diverticulosis    DOE (dyspnea on exertion)    Dupuytren's disease of finger and palm of right hand    Essential hypertension    GERD (gastroesophageal reflux disease)    Gout    Hemorrhoids    Hiatal hernia    History of bilateral cataract extraction    HTN (hypertension)    Hyperlipidemia    Insomnia    Lacunar infarction (CVA) 01/01/2011   a,) noted on MRI brain 01/01/2011: remote lacunar infarcts of LEFT cerebellum and basal ganglia BILATERALLY   Myelolipoma of left adrenal gland    Myocardial infarction (HCC) 1976   a.) type/details unknown   NSTEMI (non-ST elevated myocardial infarction) (HCC) 08/11/2014   a.) troponins were trended: 0.35 --> 0.54 --> 0.54 ng/mL   Orthostasis    Osteoporosis    PAF (paroxysmal atrial fibrillation) (HCC) 07/2014   a.) CHA2DS2-VASc = 8 (age x2, sex, HFpEF, HTN, CVA x2, vascular disease, T2DM) as of 07/01/2023; b.) cardiac rate/rhythm maintained on oral metoprolol  succinate; no OAC   PONV (postoperative nausea and vomiting)    Positive ANA (antinuclear antibody)    a.) workup with rheumatology (-) for SLE   Presence of permanent cardiac pacemaker 08/13/2014   a.) s/p St. Jude Assurity dual chamber PPM 08/13/2014 to LEFT infraclavicular deltopectoral region   PVD (peripheral vascular disease)    Recurrent falls    Right inguinal hernia    S/P CABG x 5 1989   Scoliosis    Symptomatic bradycardia    a.) s/p St. Jude Assurity dual chamber PPM  08/13/2014   Thoracic compression fracture (L1-L2)    a.) s/p L1 balloon kyphoplasty 08/13/2022   Thrombocytopenia    TMJ (dislocation of temporomandibular joint)    Type 2 diabetes mellitus treated with insulin  [facility])     Past Surgical History:  Procedure Laterality Date   CARDIAC CATHETERIZATION  08/11/2014   Procedure: TEMPORARY PACEMAKER;  Surgeon: Dorn JINNY Lesches, MD;  Location: [facility] CATH LAB;  Service: Cardiovascular;;   CAROTID ENDARTERECTOMY Right 12/31/2010   CAROTID ENDARTERECTOMY Left 02/09/2011   CATARACT EXTRACTION W/ INTRAOCULAR LENS IMPLANT Bilateral    CHOLECYSTECTOMY     COLONOSCOPY WITH PROPOFOL  N/A 08/10/2022   Procedure: COLONOSCOPY WITH PROPOFOL ;  Surgeon: Shila Gustav GAILS, MD;  Location: [facility] ENDOSCOPY;  Service: Gastroenterology;  Laterality: N/A;   CORONARY ARTERY BYPASS GRAFT  05/26/1987   x 5 vessels   ENDOSCOPIC RETROGRADE CHOLANGIOPANCREATOGRAPHY (ERCP) WITH PROPOFOL  N/A 01/27/2022   Procedure: ENDOSCOPIC RETROGRADE CHOLANGIOPANCREATOGRAPHY (ERCP) WITH PROPOFOL ;  Surgeon: Aneita Gwendlyn DASEN, MD;  Location: [facility];  Service: Gastroenterology;  Laterality: N/A;   ESOPHAGOGASTRODUODENOSCOPY N/A 08/09/2022   Procedure: ESOPHAGOGASTRODUODENOSCOPY (EGD);  Surgeon: Legrand Victory LITTIE DOUGLAS, MD;  Location: [facility] ENDOSCOPY;  Service: Gastroenterology;  Laterality: N/A;   HOT HEMOSTASIS N/A 08/10/2022   Procedure: HOT HEMOSTASIS (ARGON PLASMA COAGULATION/BICAP);  Surgeon: Nandigam, Kavitha V, MD;  Location: [facility] ENDOSCOPY;  Service: Gastroenterology;  Laterality: N/A;   I & D KNEE WITH POLY EXCHANGE Left 07/29/2015   Procedure: IRRIGATION AND DEBRIDEMENT LEFT KNEE WITH POLY EXCHANGE;  Surgeon: Redell Shoals, MD;  Location: [facility] OR;  Service: Orthopedics;  Laterality: Left;   INGUINAL HERNIA REPAIR Right 07/02/2023   Procedure: HERNIA REPAIR INGUINAL ADULT, open, RNFA to assist;  Surgeon: Jordis Laneta FALCON, MD;  Location: [facility] ORS;  Service: General;  Laterality: Right;   IR GENERIC  HISTORICAL  03/04/2016   IR US  GUIDE VASC ACCESS RIGHT 03/04/2016 Rome Hall, MD [facility]   IR GENERIC HISTORICAL  03/04/2016   IR FLUORO GUIDE CV LINE RIGHT 03/04/2016 Rome Hall, MD [facility]   IR KYPHO LUMBAR INC FX REDUCE BONE BX UNI/BIL CANNULATION INC/IMAGING  08/13/2022   PERMANENT PACEMAKER INSERTION N/A 08/13/2014   STJ dual chamber pacemaker implanted by Dr Waddell for CHB   REMOVAL OF STONES  01/27/2022   Procedure: REMOVAL OF STONES;  Surgeon: Aneita Gwendlyn DASEN, MD;  Location: [facility];  Service: Gastroenterology;;   ANNETT  01/27/2022   Procedure: ANNETT;  Surgeon: Aneita Gwendlyn DASEN, MD;  Location: [facility] ENDOSCOPY;  Service: Gastroenterology;;   TONSILLECTOMY     TOTAL KNEE ARTHROPLASTY Bilateral     Current Medications: Current Meds  Medication Sig   amLODipine  (NORVASC ) 10 MG tablet Take 10 mg by mouth daily.   amoxicillin  (AMOXIL ) 500 MG capsule Take 1 capsule (500 mg total) by mouth 2 (two) times daily.   B-D UF III MINI PEN NEEDLES 31G X 5 MM MISC USE DAILY WITH INSULIN  PEN   cholecalciferol (VITAMIN D3) 25 MCG (1000 UNIT) tablet Take 1,000 Units by mouth daily.   clotrimazole -betamethasone  (LOTRISONE ) cream APPLY TOPICALLY 2 TIMES DAILY AS NEEDED.   cyanocobalamin  (VITAMIN B12) 1000 MCG tablet Take 1 tablet (1,000 mcg total) by mouth daily.   dorzolamide-timolol (COSOPT) 22.3-6.8 MG/ML ophthalmic solution Place 1 drop into both eyes daily in the afternoon. In both eye   insulin  glargine (LANTUS  SOLOSTAR) 100 UNIT/ML Solostar Pen INJECT 46 UNITS INTO THE SKIN DAILY.   Iron , Ferrous Sulfate , 325 (65 Fe) MG TABS Take 325 mg by mouth 3 (three) times a week.   metoprolol  succinate (TOPROL  XL) 25 MG 24 hr tablet Take 1 tablet (25 mg total) by mouth at bedtime.   Multiple Vitamins-Minerals (PRESERVISION AREDS 2) CAPS Take 1 capsule by mouth daily.   pantoprazole  (PROTONIX ) 40 MG tablet TAKE 1 TABLET BY MOUTH TWICE A DAY   rosuvastatin  (CRESTOR ) 20 MG  tablet Take 1 tablet (20 mg total) by mouth daily.   torsemide  (DEMADEX ) 10 MG tablet Take 1 tablet (10 mg total) by mouth 2 (two) times a week. Monday and Friday (Patient taking differently: Take 10 mg by mouth 3 (three) times a week. Monday Wed and Friday)   vitamin C  (ASCORBIC ACID) 250 MG tablet Take 250 mg by mouth 3 (three) times a week. M-W-F     Allergies:   Zoledronic  acid, Lokelma  [sodium zirconium cyclosilicate ], and Nsaids   Family History: The patient's family history includes Cancer in an other family member;  Diabetes in his mother, son, son, and another family member; Heart attack in his brother, father, and mother; Heart disease in his brother, father, and mother; Hypertension in his brother, father, son, and son; Varicose Veins in his mother. There is no history of Colon cancer.  ROS:   Please see the history of present illness.     All other systems reviewed and are negative.  EKGs/Labs/Other Studies Reviewed:    The following studies were reviewed today: Comprehensive pacemaker interrogation in the office (see above) EKG Interpretation Date/Time:  Monday May 01 2024 10:22:51 EST Ventricular Rate:  58 PR Interval:  190 QRS Duration:  184 QT Interval:  474 QTC Calculation: 465 R Axis:   256  Text Interpretation: Atrial-sensed ventricular-paced rhythm with occasional Premature ventricular complexes When compared with ECG of 11-May-2023 08:56, Premature ventricular complexes are now Present Vent. rate has decreased BY   7 BPM Confirmed by Kaamil Morefield ([id number]) on 05/04/2024 5:37:15 PM     Echocardiogram 12/25/2022   1. Left ventricular ejection fraction, by estimation, is 60 to 65%. The  left ventricle has normal function. The left ventricle has no regional  wall motion abnormalities. There is mild left ventricular hypertrophy.  Left ventricular diastolic parameters  are consistent with Grade I diastolic dysfunction (impaired relaxation).   2. Right  ventricular systolic function is normal. The right ventricular  size is normal.   3. The mitral valve is normal in structure. Mild mitral valve  regurgitation. No evidence of mitral stenosis.   4. The aortic valve is tricuspid. There is mild calcification of the  aortic valve. There is mild thickening of the aortic valve. Aortic valve  regurgitation is not visualized. Aortic valve sclerosis is present, with  no evidence of aortic valve stenosis.   5. The inferior vena cava is normal in size with greater than 50%  respiratory variability, suggesting right atrial pressure of 3 mmHg.    Recent Labs: 05/28/2023: Pro B Natriuretic peptide (BNP) 348.0 09/14/2023: BUN 43; Creatinine, Ser 2.49; Hemoglobin 10.4; Platelets 94.0; Potassium 5.5 No hemolysis seen; Sodium 145 01/20/2024: ALT 46; TSH 2.74  Recent Lipid Panel    Component Value Date/Time   CHOL 123 01/20/2024 1019   TRIG 154.0 (H) 01/20/2024 1019   TRIG 62 04/28/2006 0917   HDL 46.60 01/20/2024 1019   CHOLHDL 3 01/20/2024 1019   VLDL 30.8 01/20/2024 1019   LDLCALC 45 01/20/2024 1019   LDLCALC 71 04/12/2020 1705   LDLDIRECT 135.1 03/12/2014 0812    Risk Assessment/Calculations:    CHA2DS2-VASc Score = 6   This indicates a 9.7% annual risk of stroke. The patient's score is based upon: CHF History: 1 HTN History: 1 Diabetes History: 1 Stroke History: 0 Vascular Disease History: 1 Age Score: 2 Gender Score: 0             Physical Exam:    VS:  BP (!) 138/50 (BP Location: Left Arm, Patient Position: Sitting, Cuff Size: Normal)   Pulse (!) 58   Ht 5' 11 (1.803 m)   Wt 181 lb 12.8 oz (82.5 kg)   SpO2 96%   BMI 25.36 kg/m     Wt Readings from Last 3 Encounters:  05/01/24 181 lb 12.8 oz (82.5 kg)  01/20/24 184 lb (83.5 kg)  01/18/24 178 lb (80.7 kg)      General: Alert, oriented x3, no distress, healthy pacemaker site Head: no evidence of trauma, PERRL, EOMI, no exophtalmos or lid lag, no myxedema, no  xanthelasma; normal ears, nose and oropharynx Neck: normal jugular venous pulsations and no hepatojugular reflux; brisk carotid pulses without delay and no carotid bruits Chest: clear to auscultation, no signs of consolidation by percussion or palpation, normal fremitus, symmetrical and full respiratory excursions Cardiovascular: normal position and quality of the apical impulse, regular rhythm, normal first and paradoxically split second heart sounds, no murmurs, rubs or gallops Abdomen: no tenderness or distention, no masses by palpation, no abnormal pulsatility or arterial bruits, normal bowel sounds, no hepatosplenomegaly Extremities: no clubbing, cyanosis or edema; 2+ radial, ulnar and brachial pulses bilaterally; 2+ right femoral, posterior tibial and dorsalis pedis pulses; 2+ left femoral, posterior tibial and dorsalis pedis pulses; no subclavian or femoral bruits Neurological: grossly nonfocal Psych: Normal mood and affect    ASSESSMENT:    1. Chronic diastolic CHF (congestive heart failure) (HCC)   2. Coronary artery disease involving native coronary artery of native heart without angina pectoris   3. Paroxysmal atrial fibrillation (HCC)   4. CHB (complete heart block) (HCC)   5. Pacemaker   6. Essential hypertension   7. Anemia in other chronic diseases classified elsewhere   8. History of bilateral carotid endarterectomy   9. Hypercholesterolemia   10. Type 2 diabetes mellitus with stage 4 chronic kidney disease, without long-term current use of insulin  (HCC)    PLAN:    In order of problems listed above:  CHF: He has good functional status and is clinically euvolemic while taking a very low-dose of diuretics (torsemide  10 mg on Monday, Wednesday Friday only).  He has preserved LV function.  With advanced kidney disease and good functional status, no changes are planned to his heart failure regimen. CAD: He does not have angina pectoris.  It's been over 35 years since he had  bypass surgery.  Lipid parameters in target range.  Taking aspirin  81 mg daily.  On beta-blockers. Parox AFib: The burden of arrhythmia is extremely low, under 1% without any episodes of atrial fibrillation since March 2025.  Not on anticoagulation due to recurrent problems with bleeding and severe anemia. CHB: pacemaker dependent.  Will make an appointment in the office just before the anticipated need for pacemaker generator change out in February. Pacemaker: Checking battery voltage monthly since we anticipate need for generator change out in about 2.5 months, probably towards the end of February.  Discussed the fact that there is a 34-month backup built into the device battery. Anemia: Does have a history of iron  deficiency, but also likely a component of bone marrow dysfunction since he also has mild thrombocytopenia.  Erythropoietin deficiency due to CKD is probably playing a role as well.. Carotid stenosis status post bilateral endarterectomy: Ultrasound December showed no significant obstruction.  Serial studies remain no longer be necessary since it is unlikely we will do preventative stents or surgery at age 78 or higher. HLP: Excellent lipid parameters, continue the current rosuvastatin  prescription. HTN: Blood pressure is very well-controlled and he has a rather low diastolic blood pressure.  Would avoid perfect systolic blood pressure control since this may increase his risk of falls or other complications.  For now, continue the current doses of amlodipine , metoprolol  and losartan .  If necessary, I would decrease the amlodipine  first. DM2: He has very limited desire to be strictly compliant with the dietary restrictions at his age. CKD4: GFR has been less than 30 for about 10 years.  Most recent creatinine a little worse at 3.1, GFR right at 20.  Medication Adjustments/Labs and Tests Ordered: Current medicines are reviewed at length with the patient today.  Concerns regarding  medicines are outlined above.  Orders Placed This Encounter  Procedures   EKG 12-Lead   No orders of the defined types were placed in this encounter.   Patient Instructions  Medication Instructions:  No changes *If you need a refill on your cardiac medications before your next appointment, please call your pharmacy*  Lab Work: None ordered If you have labs (blood work) drawn today and your tests are completely normal, you will receive your results only by: MyChart Message (if you have MyChart) OR A paper copy in the mail If you have any lab test that is abnormal or we need to change your treatment, we will call you to review the results.  Testing/Procedures: None ordered  Follow-Up: At [facility], you and your health needs are our priority.  As part of our continuing mission to provide you with exceptional heart care, our providers are all part of one team.  This team includes your primary Cardiologist (physician) and Advanced Practice Providers or APPs (Physician Assistants and Nurse Practitioners) who all work together to provide you with the care you need, when you need it.  Your next appointment:    07/13/24 at 10:00   Provider:   Dr Francyne  We recommend signing up for the patient portal called MyChart.  Sign up information is provided on this After Visit Summary.  MyChart is used to connect with patients for Virtual Visits (Telemedicine).  Patients are able to view lab/test results, encounter notes, upcoming appointments, etc.  Non-urgent messages can be sent to your provider as well.   To learn more about what you can do with MyChart, go to [email or web address].      Signed, Jerel Francyne, MD  05/07/2024 3:01 PM    [facility]

## 2024-05-07 ENCOUNTER — Ambulatory Visit: Attending: Internal Medicine

## 2024-05-07 ENCOUNTER — Encounter: Payer: Self-pay | Admitting: Cardiovascular Disease

## 2024-05-07 DIAGNOSIS — I480 Paroxysmal atrial fibrillation: Secondary | ICD-10-CM

## 2024-05-08 ENCOUNTER — Encounter

## 2024-05-09 LAB — CUP PACEART REMOTE DEVICE CHECK
Battery Remaining Longevity: 3 mo
Battery Remaining Percentage: 3 %
Battery Voltage: 2.71 V
Brady Statistic AP VP Percent: 23 %
Brady Statistic AP VS Percent: 1 %
Brady Statistic AS VP Percent: 77 %
Brady Statistic AS VS Percent: 1 %
Brady Statistic RA Percent Paced: 23 %
Brady Statistic RV Percent Paced: 99 %
Date Time Interrogation Session: 20251214020013
Implantable Lead Connection Status: 753985
Implantable Lead Connection Status: 753985
Implantable Lead Implant Date: 20160321
Implantable Lead Implant Date: 20160321
Implantable Lead Location: 753859
Implantable Lead Location: 753860
Implantable Pulse Generator Implant Date: 20160321
Lead Channel Impedance Value: 340 Ohm
Lead Channel Impedance Value: 360 Ohm
Lead Channel Pacing Threshold Amplitude: 0.75 V
Lead Channel Pacing Threshold Amplitude: 1 V
Lead Channel Pacing Threshold Pulse Width: 0.5 ms
Lead Channel Pacing Threshold Pulse Width: 0.8 ms
Lead Channel Sensing Intrinsic Amplitude: 2.8 mV
Lead Channel Sensing Intrinsic Amplitude: 9 mV
Lead Channel Setting Pacing Amplitude: 2.5 V
Lead Channel Setting Pacing Amplitude: 3 V
Lead Channel Setting Pacing Pulse Width: 0.5 ms
Lead Channel Setting Sensing Sensitivity: 8 mV
Pulse Gen Model: 2240
Pulse Gen Serial Number: 7734710

## 2024-05-12 ENCOUNTER — Emergency Department (HOSPITAL_COMMUNITY)

## 2024-05-12 ENCOUNTER — Inpatient Hospital Stay (HOSPITAL_COMMUNITY): Admitting: Anesthesiology

## 2024-05-12 ENCOUNTER — Encounter (HOSPITAL_COMMUNITY): Payer: Self-pay | Admitting: Emergency Medicine

## 2024-05-12 ENCOUNTER — Encounter (HOSPITAL_COMMUNITY)
Admission: EM | Disposition: A | Discharge status: Home Health | Payer: Self-pay | Source: Home / Self Care | Attending: Internal Medicine

## 2024-05-12 ENCOUNTER — Other Ambulatory Visit: Payer: Self-pay

## 2024-05-12 ENCOUNTER — Ambulatory Visit: Payer: Self-pay | Admitting: Internal Medicine

## 2024-05-12 ENCOUNTER — Inpatient Hospital Stay (HOSPITAL_COMMUNITY)
Admission: EM | Admit: 2024-05-12 | Discharge: 2024-05-16 | DRG: 660 | Disposition: A | Discharge status: Home Health | Attending: Internal Medicine | Admitting: Internal Medicine

## 2024-05-12 ENCOUNTER — Inpatient Hospital Stay (HOSPITAL_COMMUNITY)

## 2024-05-12 DIAGNOSIS — D649 Anemia, unspecified: Secondary | ICD-10-CM | POA: Diagnosis not present

## 2024-05-12 DIAGNOSIS — Z9842 Cataract extraction status, left eye: Secondary | ICD-10-CM

## 2024-05-12 DIAGNOSIS — Z950 Presence of cardiac pacemaker: Secondary | ICD-10-CM

## 2024-05-12 DIAGNOSIS — I10 Essential (primary) hypertension: Secondary | ICD-10-CM | POA: Diagnosis present

## 2024-05-12 DIAGNOSIS — I442 Atrioventricular block, complete: Secondary | ICD-10-CM | POA: Diagnosis present

## 2024-05-12 DIAGNOSIS — I130 Hypertensive heart and chronic kidney disease with heart failure and stage 1 through stage 4 chronic kidney disease, or unspecified chronic kidney disease: Secondary | ICD-10-CM | POA: Diagnosis present

## 2024-05-12 DIAGNOSIS — D696 Thrombocytopenia, unspecified: Secondary | ICD-10-CM | POA: Diagnosis present

## 2024-05-12 DIAGNOSIS — Z9049 Acquired absence of other specified parts of digestive tract: Secondary | ICD-10-CM

## 2024-05-12 DIAGNOSIS — I700 Atherosclerosis of aorta: Secondary | ICD-10-CM | POA: Diagnosis present

## 2024-05-12 DIAGNOSIS — I5032 Chronic diastolic (congestive) heart failure: Secondary | ICD-10-CM | POA: Diagnosis present

## 2024-05-12 DIAGNOSIS — I509 Heart failure, unspecified: Secondary | ICD-10-CM

## 2024-05-12 DIAGNOSIS — N132 Hydronephrosis with renal and ureteral calculous obstruction: Secondary | ICD-10-CM | POA: Diagnosis present

## 2024-05-12 DIAGNOSIS — E1165 Type 2 diabetes mellitus with hyperglycemia: Secondary | ICD-10-CM | POA: Diagnosis present

## 2024-05-12 DIAGNOSIS — N184 Chronic kidney disease, stage 4 (severe): Secondary | ICD-10-CM | POA: Diagnosis present

## 2024-05-12 DIAGNOSIS — R5381 Other malaise: Secondary | ICD-10-CM | POA: Diagnosis present

## 2024-05-12 DIAGNOSIS — N138 Other obstructive and reflux uropathy: Secondary | ICD-10-CM | POA: Diagnosis present

## 2024-05-12 DIAGNOSIS — Z886 Allergy status to analgesic agent status: Secondary | ICD-10-CM

## 2024-05-12 DIAGNOSIS — M419 Scoliosis, unspecified: Secondary | ICD-10-CM | POA: Diagnosis present

## 2024-05-12 DIAGNOSIS — K5900 Constipation, unspecified: Secondary | ICD-10-CM | POA: Diagnosis present

## 2024-05-12 DIAGNOSIS — E1122 Type 2 diabetes mellitus with diabetic chronic kidney disease: Secondary | ICD-10-CM | POA: Diagnosis present

## 2024-05-12 DIAGNOSIS — E1169 Type 2 diabetes mellitus with other specified complication: Secondary | ICD-10-CM | POA: Diagnosis present

## 2024-05-12 DIAGNOSIS — Z794 Long term (current) use of insulin: Secondary | ICD-10-CM | POA: Diagnosis not present

## 2024-05-12 DIAGNOSIS — I2510 Atherosclerotic heart disease of native coronary artery without angina pectoris: Secondary | ICD-10-CM

## 2024-05-12 DIAGNOSIS — N179 Acute kidney failure, unspecified: Secondary | ICD-10-CM | POA: Diagnosis present

## 2024-05-12 DIAGNOSIS — M1990 Unspecified osteoarthritis, unspecified site: Secondary | ICD-10-CM | POA: Diagnosis present

## 2024-05-12 DIAGNOSIS — Z8673 Personal history of transient ischemic attack (TIA), and cerebral infarction without residual deficits: Secondary | ICD-10-CM

## 2024-05-12 DIAGNOSIS — D631 Anemia in chronic kidney disease: Secondary | ICD-10-CM | POA: Diagnosis present

## 2024-05-12 DIAGNOSIS — Z87891 Personal history of nicotine dependence: Secondary | ICD-10-CM

## 2024-05-12 DIAGNOSIS — Z9841 Cataract extraction status, right eye: Secondary | ICD-10-CM

## 2024-05-12 DIAGNOSIS — Z951 Presence of aortocoronary bypass graft: Secondary | ICD-10-CM

## 2024-05-12 DIAGNOSIS — I252 Old myocardial infarction: Secondary | ICD-10-CM | POA: Diagnosis not present

## 2024-05-12 DIAGNOSIS — E1151 Type 2 diabetes mellitus with diabetic peripheral angiopathy without gangrene: Secondary | ICD-10-CM | POA: Diagnosis present

## 2024-05-12 DIAGNOSIS — N201 Calculus of ureter: Secondary | ICD-10-CM

## 2024-05-12 DIAGNOSIS — M810 Age-related osteoporosis without current pathological fracture: Secondary | ICD-10-CM | POA: Diagnosis present

## 2024-05-12 DIAGNOSIS — T8454XD Infection and inflammatory reaction due to internal left knee prosthesis, subsequent encounter: Secondary | ICD-10-CM

## 2024-05-12 DIAGNOSIS — N200 Calculus of kidney: Principal | ICD-10-CM

## 2024-05-12 DIAGNOSIS — E1129 Type 2 diabetes mellitus with other diabetic kidney complication: Secondary | ICD-10-CM | POA: Diagnosis present

## 2024-05-12 DIAGNOSIS — K219 Gastro-esophageal reflux disease without esophagitis: Secondary | ICD-10-CM | POA: Diagnosis present

## 2024-05-12 DIAGNOSIS — Z888 Allergy status to other drugs, medicaments and biological substances status: Secondary | ICD-10-CM

## 2024-05-12 DIAGNOSIS — H5461 Unqualified visual loss, right eye, normal vision left eye: Secondary | ICD-10-CM | POA: Diagnosis present

## 2024-05-12 DIAGNOSIS — E785 Hyperlipidemia, unspecified: Secondary | ICD-10-CM

## 2024-05-12 DIAGNOSIS — I480 Paroxysmal atrial fibrillation: Secondary | ICD-10-CM | POA: Diagnosis present

## 2024-05-12 DIAGNOSIS — Z96653 Presence of artificial knee joint, bilateral: Secondary | ICD-10-CM | POA: Diagnosis present

## 2024-05-12 DIAGNOSIS — N320 Bladder-neck obstruction: Secondary | ICD-10-CM | POA: Diagnosis present

## 2024-05-12 DIAGNOSIS — Z8249 Family history of ischemic heart disease and other diseases of the circulatory system: Secondary | ICD-10-CM | POA: Diagnosis not present

## 2024-05-12 DIAGNOSIS — M720 Palmar fascial fibromatosis [Dupuytren]: Secondary | ICD-10-CM | POA: Diagnosis present

## 2024-05-12 DIAGNOSIS — Z833 Family history of diabetes mellitus: Secondary | ICD-10-CM

## 2024-05-12 DIAGNOSIS — N411 Chronic prostatitis: Secondary | ICD-10-CM | POA: Diagnosis present

## 2024-05-12 DIAGNOSIS — Z79899 Other long term (current) drug therapy: Secondary | ICD-10-CM

## 2024-05-12 DIAGNOSIS — Z860100 Personal history of colon polyps, unspecified: Secondary | ICD-10-CM

## 2024-05-12 DIAGNOSIS — Z961 Presence of intraocular lens: Secondary | ICD-10-CM | POA: Diagnosis present

## 2024-05-12 DIAGNOSIS — Z9181 History of falling: Secondary | ICD-10-CM

## 2024-05-12 DIAGNOSIS — E875 Hyperkalemia: Secondary | ICD-10-CM | POA: Diagnosis present

## 2024-05-12 DIAGNOSIS — R10A1 Flank pain, right side: Secondary | ICD-10-CM | POA: Diagnosis present

## 2024-05-12 DIAGNOSIS — E7849 Other hyperlipidemia: Secondary | ICD-10-CM | POA: Diagnosis present

## 2024-05-12 DIAGNOSIS — N401 Enlarged prostate with lower urinary tract symptoms: Secondary | ICD-10-CM | POA: Diagnosis present

## 2024-05-12 HISTORY — PX: CYSTOSCOPY WITH STENT PLACEMENT: SHX5790

## 2024-05-12 LAB — POCT I-STAT, CHEM 8
BUN: 59 mg/dL — ABNORMAL HIGH (ref 8–23)
Calcium, Ion: 1.22 mmol/L (ref 1.15–1.40)
Chloride: 115 mmol/L — ABNORMAL HIGH (ref 98–111)
Creatinine, Ser: 5 mg/dL — ABNORMAL HIGH (ref 0.61–1.24)
Glucose, Bld: 187 mg/dL — ABNORMAL HIGH (ref 70–99)
HCT: 32 % — ABNORMAL LOW (ref 39.0–52.0)
Hemoglobin: 10.9 g/dL — ABNORMAL LOW (ref 13.0–17.0)
Potassium: 6 mmol/L — ABNORMAL HIGH (ref 3.5–5.1)
Sodium: 141 mmol/L (ref 135–145)
TCO2: 18 mmol/L — ABNORMAL LOW (ref 22–32)

## 2024-05-12 LAB — URINALYSIS, ROUTINE W REFLEX MICROSCOPIC
Bacteria, UA: NONE SEEN
Bilirubin Urine: NEGATIVE
Glucose, UA: 50 mg/dL — AB
Ketones, ur: NEGATIVE mg/dL
Leukocytes,Ua: NEGATIVE
Nitrite: NEGATIVE
Protein, ur: 30 mg/dL — AB
Specific Gravity, Urine: 1.011 (ref 1.005–1.030)
pH: 5 (ref 5.0–8.0)

## 2024-05-12 LAB — CBC WITH DIFFERENTIAL/PLATELET
Abs Immature Granulocytes: 0.03 K/uL (ref 0.00–0.07)
Basophils Absolute: 0 K/uL (ref 0.0–0.1)
Basophils Relative: 0 %
Eosinophils Absolute: 0.1 K/uL (ref 0.0–0.5)
Eosinophils Relative: 1 %
HCT: 33.7 % — ABNORMAL LOW (ref 39.0–52.0)
Hemoglobin: 10.9 g/dL — ABNORMAL LOW (ref 13.0–17.0)
Immature Granulocytes: 0 %
Lymphocytes Relative: 14 %
Lymphs Abs: 1.1 K/uL (ref 0.7–4.0)
MCH: 29.9 pg (ref 26.0–34.0)
MCHC: 32.3 g/dL (ref 30.0–36.0)
MCV: 92.6 fL (ref 80.0–100.0)
Monocytes Absolute: 0.7 K/uL (ref 0.1–1.0)
Monocytes Relative: 9 %
Neutro Abs: 6.2 K/uL (ref 1.7–7.7)
Neutrophils Relative %: 76 %
Platelets: 92 K/uL — ABNORMAL LOW (ref 150–400)
RBC: 3.64 MIL/uL — ABNORMAL LOW (ref 4.22–5.81)
RDW: 13.8 % (ref 11.5–15.5)
Smear Review: NORMAL
WBC: 8.2 K/uL (ref 4.0–10.5)
nRBC: 0 % (ref 0.0–0.2)

## 2024-05-12 LAB — COMPREHENSIVE METABOLIC PANEL WITH GFR
ALT: 49 U/L — ABNORMAL HIGH (ref 0–44)
AST: 43 U/L — ABNORMAL HIGH (ref 15–41)
Albumin: 3.6 g/dL (ref 3.5–5.0)
Alkaline Phosphatase: 307 U/L — ABNORMAL HIGH (ref 38–126)
Anion gap: 10 (ref 5–15)
BUN: 61 mg/dL — ABNORMAL HIGH (ref 8–23)
CO2: 22 mmol/L (ref 22–32)
Calcium: 9.4 mg/dL (ref 8.9–10.3)
Chloride: 110 mmol/L (ref 98–111)
Creatinine, Ser: 4.34 mg/dL — ABNORMAL HIGH (ref 0.61–1.24)
GFR, Estimated: 12 mL/min — ABNORMAL LOW
Glucose, Bld: 175 mg/dL — ABNORMAL HIGH (ref 70–99)
Potassium: 6.4 mmol/L (ref 3.5–5.1)
Sodium: 142 mmol/L (ref 135–145)
Total Bilirubin: 0.4 mg/dL (ref 0.0–1.2)
Total Protein: 6.6 g/dL (ref 6.5–8.1)

## 2024-05-12 LAB — GLUCOSE, CAPILLARY
Glucose-Capillary: 192 mg/dL — ABNORMAL HIGH (ref 70–99)
Glucose-Capillary: 136 mg/dL — ABNORMAL HIGH (ref 70–99)

## 2024-05-12 LAB — BASIC METABOLIC PANEL WITH GFR
Anion gap: 11 (ref 5–15)
BUN: 57 mg/dL — ABNORMAL HIGH (ref 8–23)
CO2: 17 mmol/L — ABNORMAL LOW (ref 22–32)
Calcium: 9 mg/dL (ref 8.9–10.3)
Chloride: 112 mmol/L — ABNORMAL HIGH (ref 98–111)
Creatinine, Ser: 4.1 mg/dL — ABNORMAL HIGH (ref 0.61–1.24)
GFR, Estimated: 13 mL/min — ABNORMAL LOW
Glucose, Bld: 148 mg/dL — ABNORMAL HIGH (ref 70–99)
Potassium: 5.8 mmol/L — ABNORMAL HIGH (ref 3.5–5.1)
Sodium: 140 mmol/L (ref 135–145)

## 2024-05-12 LAB — LIPASE, BLOOD: Lipase: 25 U/L (ref 11–51)

## 2024-05-12 SURGERY — CYSTOSCOPY, WITH STENT INSERTION
Anesthesia: General | Laterality: Right

## 2024-05-12 MED ORDER — SENNOSIDES-DOCUSATE SODIUM 8.6-50 MG PO TABS: 1.0000 | ORAL_TABLET | Freq: Every evening | ORAL | Status: DC | PRN

## 2024-05-12 MED ORDER — BISACODYL 5 MG PO TBEC: 5.0000 mg | DELAYED_RELEASE_TABLET | Freq: Every day | ORAL | Status: DC | PRN

## 2024-05-12 MED ORDER — AMOXICILLIN 500 MG PO CAPS
500.0000 mg | ORAL_CAPSULE | Freq: Two times a day (BID) | ORAL | Status: DC
  Administered 2024-05-13 – 2024-05-16 (×8): 500 mg via ORAL
  Filled 2024-05-12 (×9): qty 1

## 2024-05-12 MED ORDER — ACETAMINOPHEN 10 MG/ML IV SOLN: 1000.0000 mg | Freq: Once | INTRAVENOUS | Status: DC | PRN

## 2024-05-12 MED ORDER — AMLODIPINE BESYLATE 10 MG PO TABS
10.0000 mg | ORAL_TABLET | Freq: Every day | ORAL | Status: DC
  Administered 2024-05-13 – 2024-05-16 (×4): 10 mg via ORAL
  Filled 2024-05-12 (×4): qty 1

## 2024-05-12 MED ORDER — VITAMIN D 25 MCG (1000 UNIT) PO TABS
1000.0000 [IU] | ORAL_TABLET | Freq: Every day | ORAL | Status: DC
  Administered 2024-05-13 – 2024-05-16 (×4): 1000 [IU] via ORAL
  Filled 2024-05-12 (×4): qty 1

## 2024-05-12 MED ORDER — CEFAZOLIN SODIUM-DEXTROSE 2-4 GM/100ML-% IV SOLN
2.0000 g | Freq: Once | INTRAVENOUS | Status: AC
  Administered 2024-05-12: 2 g via INTRAVENOUS

## 2024-05-12 MED ORDER — ONDANSETRON HCL 4 MG/2ML IJ SOLN
INTRAMUSCULAR | Status: DC | PRN
  Administered 2024-05-12: 4 mg via INTRAVENOUS

## 2024-05-12 MED ORDER — SENNOSIDES-DOCUSATE SODIUM 8.6-50 MG PO TABS
2.0000 | ORAL_TABLET | Freq: Every day | ORAL | Status: DC
  Administered 2024-05-13 – 2024-05-15 (×4): 2 via ORAL
  Filled 2024-05-12 (×4): qty 2

## 2024-05-12 MED ORDER — LACTATED RINGERS IV SOLN: INTRAVENOUS | Status: DC | PRN

## 2024-05-12 MED ORDER — ROSUVASTATIN CALCIUM 10 MG PO TABS
20.0000 mg | ORAL_TABLET | Freq: Every day | ORAL | Status: DC
  Administered 2024-05-13 – 2024-05-16 (×4): 20 mg via ORAL
  Filled 2024-05-12 (×4): qty 2

## 2024-05-12 MED ORDER — MORPHINE SULFATE (PF) 4 MG/ML IV SOLN: 4.0000 mg | Freq: Once | INTRAVENOUS | Status: DC

## 2024-05-12 MED ORDER — POLYETHYLENE GLYCOL 3350 17 G PO PACK
17.0000 g | PACK | Freq: Every day | ORAL | Status: DC
  Administered 2024-05-13 – 2024-05-16 (×3): 17 g via ORAL
  Filled 2024-05-12 (×4): qty 1

## 2024-05-12 MED ORDER — ONDANSETRON HCL 4 MG/2ML IJ SOLN: 4.0000 mg | Freq: Once | INTRAMUSCULAR | Status: DC | PRN

## 2024-05-12 MED ORDER — ONDANSETRON HCL 4 MG/2ML IJ SOLN: 4.0000 mg | Freq: Four times a day (QID) | INTRAMUSCULAR | Status: DC | PRN

## 2024-05-12 MED ORDER — ACETAMINOPHEN 650 MG RE SUPP: 650.0000 mg | Freq: Four times a day (QID) | RECTAL | Status: DC | PRN

## 2024-05-12 MED ORDER — IOHEXOL 300 MG/ML  SOLN
INTRAMUSCULAR | Status: DC | PRN
  Administered 2024-05-12: 3 mL via URETHRAL

## 2024-05-12 MED ORDER — DEXTROSE 50 % IV SOLN
1.0000 | Freq: Once | INTRAVENOUS | Status: AC
  Administered 2024-05-12: 50 mL via INTRAVENOUS
  Filled 2024-05-12: qty 50

## 2024-05-12 MED ORDER — VITAMIN B-12 1000 MCG PO TABS
1000.0000 ug | ORAL_TABLET | Freq: Every day | ORAL | Status: DC
  Administered 2024-05-13 – 2024-05-16 (×4): 1000 ug via ORAL
  Filled 2024-05-12 (×4): qty 1

## 2024-05-12 MED ORDER — ALBUTEROL SULFATE HFA 108 (90 BASE) MCG/ACT IN AERS
INHALATION_SPRAY | RESPIRATORY_TRACT | Status: AC
  Filled 2024-05-12: qty 6.7

## 2024-05-12 MED ORDER — CEFAZOLIN SODIUM-DEXTROSE 2-4 GM/100ML-% IV SOLN
INTRAVENOUS | Status: AC
  Filled 2024-05-12: qty 100

## 2024-05-12 MED ORDER — INSULIN ASPART 100 UNIT/ML IJ SOLN
0.0000 [IU] | Freq: Three times a day (TID) | INTRAMUSCULAR | Status: DC
  Administered 2024-05-13: 2 [IU] via SUBCUTANEOUS
  Filled 2024-05-12: qty 2

## 2024-05-12 MED ORDER — CALCIUM GLUCONATE-NACL 1-0.675 GM/50ML-% IV SOLN
1.0000 g | Freq: Once | INTRAVENOUS | Status: AC
  Administered 2024-05-12: 1000 mg via INTRAVENOUS
  Filled 2024-05-12: qty 50

## 2024-05-12 MED ORDER — INSULIN ASPART 100 UNIT/ML IV SOLN
5.0000 [IU] | Freq: Once | INTRAVENOUS | Status: AC
  Administered 2024-05-12: 5 [IU] via INTRAVENOUS
  Filled 2024-05-12: qty 5

## 2024-05-12 MED ORDER — FENTANYL CITRATE (PF) 50 MCG/ML IJ SOSY: 25.0000 ug | PREFILLED_SYRINGE | INTRAMUSCULAR | Status: DC | PRN

## 2024-05-12 MED ORDER — PROSIGHT PO TABS
1.0000 | ORAL_TABLET | Freq: Every day | ORAL | Status: DC
  Administered 2024-05-13 – 2024-05-16 (×4): 1 via ORAL
  Filled 2024-05-12 (×4): qty 1

## 2024-05-12 MED ORDER — OXYCODONE HCL 5 MG/5ML PO SOLN: 5.0000 mg | Freq: Once | ORAL | Status: DC | PRN

## 2024-05-12 MED ORDER — PROPOFOL 10 MG/ML IV BOLUS
INTRAVENOUS | Status: DC | PRN
  Administered 2024-05-12: 60 mg via INTRAVENOUS

## 2024-05-12 MED ORDER — DORZOLAMIDE HCL-TIMOLOL MAL 2-0.5 % OP SOLN
1.0000 [drp] | Freq: Every day | OPHTHALMIC | Status: DC
  Administered 2024-05-13 – 2024-05-15 (×4): 1 [drp] via OPHTHALMIC
  Filled 2024-05-12: qty 10

## 2024-05-12 MED ORDER — PANTOPRAZOLE SODIUM 40 MG PO TBEC
40.0000 mg | DELAYED_RELEASE_TABLET | Freq: Two times a day (BID) | ORAL | Status: DC
  Administered 2024-05-13 – 2024-05-16 (×8): 40 mg via ORAL
  Filled 2024-05-12 (×8): qty 1

## 2024-05-12 MED ORDER — SODIUM CHLORIDE 0.9 % IV SOLN: INTRAVENOUS | Status: DC

## 2024-05-12 MED ORDER — METOPROLOL SUCCINATE ER 25 MG PO TB24
25.0000 mg | ORAL_TABLET | Freq: Every day | ORAL | Status: DC
  Administered 2024-05-13 – 2024-05-15 (×4): 25 mg via ORAL
  Filled 2024-05-12 (×4): qty 1

## 2024-05-12 MED ORDER — INSULIN ASPART 100 UNIT/ML IJ SOLN: 0.0000 [IU] | Freq: Every day | INTRAMUSCULAR | Status: DC

## 2024-05-12 MED ORDER — INSULIN GLARGINE-YFGN 100 UNIT/ML ~~LOC~~ SOLN: 38.0000 [IU] | Freq: Every day | SUBCUTANEOUS | Status: DC

## 2024-05-12 MED ORDER — PHENYLEPHRINE 80 MCG/ML (10ML) SYRINGE FOR IV PUSH (FOR BLOOD PRESSURE SUPPORT)
PREFILLED_SYRINGE | INTRAVENOUS | Status: DC | PRN
  Administered 2024-05-12: 120 ug via INTRAVENOUS

## 2024-05-12 MED ORDER — PROPOFOL 10 MG/ML IV BOLUS
INTRAVENOUS | Status: AC
  Filled 2024-05-12: qty 20

## 2024-05-12 MED ORDER — SODIUM BICARBONATE 650 MG PO TABS
1300.0000 mg | ORAL_TABLET | Freq: Once | ORAL | Status: AC
  Administered 2024-05-13: 1300 mg via ORAL
  Filled 2024-05-12: qty 2

## 2024-05-12 MED ORDER — IPRATROPIUM-ALBUTEROL 0.5-2.5 (3) MG/3ML IN SOLN: 3.0000 mL | Freq: Once | RESPIRATORY_TRACT | Status: DC

## 2024-05-12 MED ORDER — VITAMIN C 500 MG PO TABS
250.0000 mg | ORAL_TABLET | ORAL | Status: DC
  Administered 2024-05-15: 250 mg via ORAL
  Filled 2024-05-12: qty 1

## 2024-05-12 MED ORDER — CHLORHEXIDINE GLUCONATE 0.12 % MT SOLN
15.0000 mL | Freq: Once | OROMUCOSAL | Status: AC
  Administered 2024-05-12: 15 mL via OROMUCOSAL
  Filled 2024-05-12: qty 15

## 2024-05-12 MED ORDER — OXYCODONE HCL 5 MG PO TABS: 5.0000 mg | ORAL_TABLET | Freq: Once | ORAL | Status: DC | PRN

## 2024-05-12 MED ORDER — FERROUS SULFATE 325 (65 FE) MG PO TABS
325.0000 mg | ORAL_TABLET | ORAL | Status: DC
  Administered 2024-05-15: 325 mg via ORAL
  Filled 2024-05-12: qty 1

## 2024-05-12 MED ORDER — INSULIN GLARGINE 100 UNIT/ML ~~LOC~~ SOLN: 38.0000 [IU] | Freq: Every day | SUBCUTANEOUS | Status: DC

## 2024-05-12 MED ORDER — PRESERVISION AREDS 2 PO CAPS: 1.0000 | ORAL_CAPSULE | Freq: Every day | ORAL | Status: DC

## 2024-05-12 MED ORDER — ONDANSETRON HCL 4 MG PO TABS: 4.0000 mg | ORAL_TABLET | Freq: Four times a day (QID) | ORAL | Status: DC | PRN

## 2024-05-12 MED ORDER — LIDOCAINE HCL (PF) 2 % IJ SOLN
INTRAMUSCULAR | Status: DC | PRN
  Administered 2024-05-12: 100 mg via INTRADERMAL

## 2024-05-12 MED ORDER — OXYCODONE-ACETAMINOPHEN 5-325 MG PO TABS: 1.0000 | ORAL_TABLET | Freq: Four times a day (QID) | ORAL | Status: DC | PRN

## 2024-05-12 MED ORDER — ACETAMINOPHEN 325 MG PO TABS
650.0000 mg | ORAL_TABLET | Freq: Four times a day (QID) | ORAL | Status: DC | PRN
  Administered 2024-05-13: 650 mg via ORAL
  Filled 2024-05-12: qty 2

## 2024-05-12 MED ORDER — ALBUTEROL SULFATE HFA 108 (90 BASE) MCG/ACT IN AERS
INHALATION_SPRAY | RESPIRATORY_TRACT | Status: DC | PRN
  Administered 2024-05-12: 5 via RESPIRATORY_TRACT

## 2024-05-12 MED ORDER — OXYCODONE-ACETAMINOPHEN 5-325 MG PO TABS
1.0000 | ORAL_TABLET | Freq: Once | ORAL | Status: AC
  Administered 2024-05-12: 1 via ORAL
  Filled 2024-05-12: qty 1

## 2024-05-12 SURGICAL SUPPLY — 14 items
BAG URO CATCHER STRL LF (MISCELLANEOUS) ×1 IMPLANT
CATH URETL OPEN 5X70 (CATHETERS) IMPLANT
CLOTH BEACON ORANGE TIMEOUT ST (SAFETY) ×1 IMPLANT
GLOVE BIOGEL M 7.0 STRL (GLOVE) ×1 IMPLANT
GOWN STRL REUS W/ TWL XL LVL3 (GOWN DISPOSABLE) ×1 IMPLANT
GUIDEWIRE STR DUAL SENSOR (WIRE) ×1 IMPLANT
GUIDEWIRE ZIPWRE .038 STRAIGHT (WIRE) IMPLANT
KIT TURNOVER KIT A (KITS) ×1 IMPLANT
MANIFOLD NEPTUNE II (INSTRUMENTS) ×1 IMPLANT
PACK CYSTO (CUSTOM PROCEDURE TRAY) ×1 IMPLANT
SHEATH DILATOR SET 8/10 (MISCELLANEOUS) IMPLANT
STENT URET 6FRX26 CONTOUR (STENTS) IMPLANT
SYR 10ML LL (SYRINGE) ×1 IMPLANT
TUBING CONNECTING 10 (TUBING) ×1 IMPLANT

## 2024-05-12 NOTE — ED Provider Notes (Signed)
 " [place] [facility]ERGENCY DEPARTMENT AT [facility] Provider Note   CSN: [id number] Arrival date & time: 05/12/24  1350     Patient presents with: Abdominal Pain, Flank Pain, and Constipation   Dean Murphy is a 89 y.o. male.    Abdominal Pain Associated symptoms: constipation   Flank Pain Associated symptoms include abdominal pain.  Constipation Associated symptoms: abdominal pain      Patient has a history of acid reflux hyperlipidemia hypertension arthritis diverticulosis heart block atrial fibrillation myocardial infarction right inguinal hernia.  Patient presents ED with complaints of abdominal pain.  Patient states over the last 4 days he has not had a normal bowel movement.  He has been having pain in the lower abdomen mostly on the right side but also some morning and flank.  The pain gets very intense at times.  Right now the pain is not severe.  He denies any urinary symptoms.  No fevers.  No vomiting.  Patient did have hernia repair in the same area that he is feeling the pain  Prior to Admission medications  Medication Sig Start Date End Date Taking? Authorizing Provider  amLODipine  (NORVASC ) 10 MG tablet Take 10 mg by mouth daily.    [provider]  amoxicillin  (AMOXIL ) 500 MG capsule Take 1 capsule (500 mg total) by mouth 2 (two) times daily. 10/27/23   Cleatus Arlyss RAMAN, MD  B-D UF III MINI PEN NEEDLES 31G X 5 MM MISC USE DAILY WITH INSULIN  PEN 03/01/23   Cleatus Arlyss RAMAN, MD  cholecalciferol (VITAMIN D3) 25 MCG (1000 UNIT) tablet Take 1,000 Units by mouth daily.    [provider]  clotrimazole -betamethasone  (LOTRISONE ) cream APPLY TOPICALLY 2 TIMES DAILY AS NEEDED. 06/29/23   Cleatus Arlyss RAMAN, MD  cyanocobalamin  (VITAMIN B12) 1000 MCG tablet Take 1 tablet (1,000 mcg total) by mouth daily. 05/30/23   Cleatus Arlyss RAMAN, MD  dorzolamide-timolol (COSOPT) 22.3-6.8 MG/ML ophthalmic solution Place 1 drop into both eyes daily in the afternoon. In both eye  01/16/22   [provider]  insulin  glargine (LANTUS  SOLOSTAR) 100 UNIT/ML Solostar Pen INJECT 46 UNITS INTO THE SKIN DAILY. 02/23/24   Cleatus Arlyss RAMAN, MD  Iron , Ferrous Sulfate , 325 (65 Fe) MG TABS Take 325 mg by mouth 3 (three) times a week. 06/18/23   Cleatus Arlyss RAMAN, MD  metoprolol  succinate (TOPROL  XL) 25 MG 24 hr tablet Take 1 tablet (25 mg total) by mouth at bedtime. 06/28/23   Croitoru, Mihai, MD  Multiple Vitamins-Minerals (PRESERVISION AREDS 2) CAPS Take 1 capsule by mouth daily.    [provider]  pantoprazole  (PROTONIX ) 40 MG tablet TAKE 1 TABLET BY MOUTH TWICE A DAY 06/29/23   Cleatus Arlyss RAMAN, MD  rosuvastatin  (CRESTOR ) 20 MG tablet Take 1 tablet (20 mg total) by mouth daily. 06/28/23   Croitoru, Mihai, MD  torsemide  (DEMADEX ) 10 MG tablet Take 1 tablet (10 mg total) by mouth 2 (two) times a week. Monday and Friday Patient taking differently: Take 10 mg by mouth 3 (three) times a week. Monday Wed and Friday 12/18/21   Cleatus Arlyss RAMAN, MD  vitamin C  (ASCORBIC ACID) 250 MG tablet Take 250 mg by mouth 3 (three) times a week. M-W-F    [provider]    Allergies: Zoledronic  acid, Lokelma  [sodium zirconium cyclosilicate ], and Nsaids    Review of Systems  Gastrointestinal:  Positive for abdominal pain and constipation.  Genitourinary:  Positive for flank pain.    Updated Vital  Signs BP (!) 147/51 (BP Location: Right Arm)   Pulse 72   Temp 97.7 F (36.5 C) (Oral)   Resp 20   SpO2 98%   Physical Exam Vitals and nursing note reviewed.  Constitutional:      General: He is not in acute distress.    Appearance: He is well-developed.  HENT:     Head: Normocephalic and atraumatic.     Right Ear: External ear normal.     Left Ear: External ear normal.  Eyes:     General: No scleral icterus.       Right eye: No discharge.        Left eye: No discharge.     Conjunctiva/sclera: Conjunctivae normal.  Neck:     Trachea: No tracheal deviation.   Cardiovascular:     Rate and Rhythm: Normal rate and regular rhythm.  Pulmonary:     Effort: Pulmonary effort is normal. No respiratory distress.     Breath sounds: Normal breath sounds. No stridor. No wheezing or rales.  Abdominal:     General: Bowel sounds are normal. There is no distension.     Palpations: Abdomen is soft.     Tenderness: There is abdominal tenderness in the right lower quadrant. There is no guarding or rebound.  Musculoskeletal:        General: No tenderness or deformity.     Cervical back: Neck supple.  Skin:    General: Skin is warm and dry.     Findings: No rash.  Neurological:     General: No focal deficit present.     Mental Status: He is alert.     Cranial Nerves: No cranial nerve deficit, dysarthria or facial asymmetry.     Sensory: No sensory deficit.     Motor: No abnormal muscle tone or seizure activity.     Coordination: Coordination normal.  Psychiatric:        Mood and Affect: Mood normal.     (all labs ordered are listed, but only abnormal results are displayed) Labs Reviewed  COMPREHENSIVE METABOLIC PANEL WITH GFR - Abnormal; Notable for the following components:      Result Value   Potassium 6.4 (*)    Glucose, Bld 175 (*)    BUN 61 (*)    Creatinine, Ser 4.34 (*)    AST 43 (*)    ALT 49 (*)    Alkaline Phosphatase 307 (*)    GFR, Estimated 12 (*)    All other components within normal limits  CBC WITH DIFFERENTIAL/PLATELET - Abnormal; Notable for the following components:   RBC 3.64 (*)    Hemoglobin 10.9 (*)    HCT 33.7 (*)    Platelets 92 (*)    All other components within normal limits  LIPASE, BLOOD  URINALYSIS, ROUTINE W REFLEX MICROSCOPIC    EKG: None  Radiology: CT ABDOMEN PELVIS WO CONTRAST Result Date: 05/12/2024 EXAM: CT ABDOMEN AND PELVIS WITHOUT CONTRAST 05/12/2024 03:37:34 PM TECHNIQUE: CT of the abdomen and pelvis was performed without the administration of intravenous contrast. Multiplanar reformatted images  are provided for review. Automated exposure control, iterative reconstruction, and/or weight-based adjustment of the mA/kV was utilized to reduce the radiation dose to as low as reasonably achievable. COMPARISON: 04/25/2020 and ultrasound from 01/19/2022. CLINICAL HISTORY: Constipation, right lower quadrant abdominal pain and right flank pain. FINDINGS: LOWER CHEST: Nonspecific peripheral interstitial accentuation at the lung bases. Mild aortic and mitral valve calcification. Pacer leads noted. Prior median sternotomy. LIVER: Slightly  lobulated contour of the liver, cirrhosis not excluded. GALLBLADDER AND BILE DUCTS: Cholecystectomy. No biliary ductal dilatation. SPLEEN: No acute abnormality. PANCREAS: No acute abnormality. ADRENAL GLANDS: 1.4 x 2.1 cm left adrenal myelolipoma on image 34 series 2. KIDNEYS, URETERS AND BLADDER: Mild to moderate right hydronephrosis and hydroureter extending down to a 5 mm in length mediastinal calculus just below the level of the iliac crests as shown on image 79 series 7. The right ureter distal to this point is of normal caliber. 3 mm in length right kidney lower pole nonobstructive renal calculus. No stones in the left kidney or left ureter. No left hydronephrosis or hydroureter. No perinephric or periureteral stranding. Urinary bladder is unremarkable. GI AND BOWEL: Stomach demonstrates no acute abnormality. Sigmoid colon diverticulosis. Overall stool burden is within normal limits. There is no bowel obstruction. PERITONEUM AND RETROPERITONEUM: No ascites. No free air. VASCULATURE: Aortic and coronary atherosclerosis. Atherosclerosis of the abdominal aorta and its branches including the celiac trunk and proximal superior mesenteric artery demonstrate a substantial atheromatous plaque. Patency not assessed on today's noncontrast examination. LYMPH NODES: No lymphadenopathy. REPRODUCTIVE ORGANS: Fatty left spermatic cord. BONES AND SOFT TISSUES: Right posterolateral 11th rib  nonunited fracture. Old healed bilateral lower rib fractures. Prior L1 vertebral augmentation. Chronic bilateral pars defects at L5 with grade 1 anterolisthesis of L5 on S1 and a resulting right foraminal impingement at L5-S1. Prior right inguinal hernia repair with some mild prominence of fatty tissues in the vicinity. No focal soft tissue abnormality. IMPRESSION: 1. Mild to moderate right hydronephrosis and hydroureter secondary to a 5 mm right ureteral calculus just below the level of the iliac crests, with normal caliber of the distal right ureter. 2. Nonobstructive 3 mm right lower pole renal calculus. 3. Slightly lobulated hepatic contour, cirrhosis not excluded. 4. Sigmoid colon diverticulosis without evidence of diverticulitis. 5. Chronic bilateral pars defects at L5 with grade 1 anterolisthesis of L5 on S1 and resulting right foraminal impingement at L5-S1, with prior L1 vertebral augmentation. 6. Additional chronic and incidental findings include aortic and coronary atherosclerosis with atherosclerosis of the abdominal aorta and its branches, mild aortic and mitral valve calcification, pacer leads, nonspecific peripheral interstitial accentuation at the lung bases, cholecystectomy, prior right inguinal hernia repair with mild fat prominence in the region and fatty left spermatic cord, prior median sternotomy, chronic nonunited right posterolateral 11th rib fracture, and old healed bilateral lower rib fractures. Electronically signed by: Ryan Salvage MD 05/12/2024 04:01 PM EST RP Workstation: [id number]     .Critical Care  Performed by: Randol Simmonds, MD Authorized by: Randol Simmonds, MD   Critical care provider statement:    Critical care time (minutes):  30   Critical care was time spent personally by me on the following activities:  Development of treatment plan with patient or surrogate, discussions with consultants, evaluation of patient's response to treatment, examination of patient, ordering  and review of laboratory studies, ordering and review of radiographic studies, ordering and performing treatments and interventions, pulse oximetry, re-evaluation of patient's condition and review of old charts    Medications Ordered in the ED  insulin  aspart (novoLOG ) injection 5 Units (has no administration in time range)    And  dextrose  50 % solution 50 mL (has no administration in time range)  calcium  gluconate 1 g/ 50 mL sodium chloride  IVPB (has no administration in time range)  morphine  (PF) 4 MG/ML injection 4 mg (has no administration in time range)  ondansetron  (ZOFRAN ) injection 4 mg (has no  administration in time range)    Clinical Course as of 05/12/24 1717  Fri May 12, 2024  1640 CT scan shows mild to moderate right hydronephrosis associated with a 5 mm ureteral stone. [JK]  1707 Case discussed with Dr. Norva.  Will keep patient NPO.  Patient last ate this morning.  Hyperkalemia treatment ordered.  I will consult the medical service for admission [JK]  1714 Case discussed with Dr Lou regarding admission [JK]    Clinical Course User Index [JK] Randol Simmonds, MD                                 Medical Decision Making Problems Addressed: AKI (acute kidney injury): acute illness or injury that poses a threat to life or bodily functions Hyperkalemia: acute illness or injury that poses a threat to life or bodily functions Kidney stone: acute illness or injury that poses a threat to life or bodily functions  Amount and/or Complexity of Data Reviewed Labs: ordered. Radiology: ordered.  Risk OTC drugs. Prescription drug management. Decision regarding hospitalization.   Patient presented to the ED for evaluation of abdominal pain.  Patient had noticed right-sided abdominal and flank pain.  He also was concerned about constipation.  Patient's ED workup does not show any leukocytosis to suggest acute infection.  Patient's urinalysis still pending.  His metabolic panel  is notable for an acute kidney injury with elevated BUN and creatinine.  Patient is also hyperkalemic at 6.4.  IV insulin  glucose calcium  ordered for his hyperkalemia.  Patient CT scan does show a kidney stone with hydronephrosis.  I have consulted with urology and spoke with Dr. Norva.  We will keep the patient n.p.o. and she will see the patient in the ED.  I will consult the medical service for admission     Final diagnoses:  Kidney stone  Hyperkalemia  AKI (acute kidney injury)    ED Discharge Orders     None          Randol Simmonds, MD 05/12/24 1717  "

## 2024-05-12 NOTE — Op Note (Cosign Needed)
 Operative Note  Preoperative diagnosis:  1.  Right ureteral stone with acute renal insufficiency  Postoperative diagnosis: 1.  Same  Procedure(s): 1.  Cystoscopy 2. Right retrograde pyelogram with interpretation 3. Right ureteral stent placement 4. Fluoroscopy <1 hour with intraoperative interpretation  Surgeon: Donnice Siad, MD  Assistants:  Maurilio Agar, MD   Anesthesia:  General  Complications:  None  EBL:  minimal   Specimens: 1. None  Drains/Catheters: 1.  6Fr x 26cm ureteral stent  Intraoperative findings:   Cystoscopy demonstrated small amount of debris in bladder. Otherwise no suspicious lesions, masses, or other pathology. Retrograde pyelogram demonstrated right hydronephrosis. Successful right ureteral stent placement with curl in the renal pelvis and bladder respectively.  Indication:  Dean Murphy is a 89 y.o. male with right obstructing ureteral stone with atrophic left kidney and acute renal insufficiency with hyperkalemia.  After reviewing the management options for treatment, he elected to proceed with the above surgical procedure(s). We have discussed the potential benefits and risks of the procedure, side effects of the proposed treatment, the likelihood of the patient achieving the goals of the procedure, and any potential problems that might occur during the procedure or recuperation. Informed consent has been obtained.  Description of procedure: The patient was taken to the operating room and general anesthesia was induced.  The patient was placed in the dorsal lithotomy position, prepped and draped in the usual sterile fashion, and preoperative antibiotics were administered. A preoperative time-out was performed.   Cystourethroscopy was performed.  The patients urethra was examined and was normal. There was some bilobar prostatic hypertrophy. The bladder was then systematically examined in its entirety. There was small amount of debris in bladder c/w  recently passed stone fragments. There was no evidence for any bladder tumors or other mucosal pathology.    Attention then turned to the right ureteral orifice. A 0.038 zip wire was passed through the right orifice and over the wire a 5 Fr open ended catheter was inserted and passed up to the level of the distal ureter. Omnipaque  contrast was injected through the ureteral catheter and a retrograde pyelogram was performed with findings as dictated above. The wire was then replaced and the open ended catheter was removed.   A 6Fr x 26cm ureteral stent was advance over the wire. The stent was positioned appropriately under fluoroscopic and cystoscopic guidance.  The wire was then removed with an adequate stent curl noted in the renal pelvis as well as in the bladder.  A foley catheter was placed with 10 cc sterile water  instilled in the balloon. The bladder was then emptied and the procedure ended.  The patient appeared to tolerate the procedure well and without complications.  The patient was able to be awakened and transferred to the recovery unit in satisfactory condition.   Plan:   - Appreciate Medicine service's management of acute renal insufficiency and comorbidities - Continue foley catheter while renal function improves  Matt R. Gay MD [facility]  Pager: [phone]

## 2024-05-12 NOTE — ED Notes (Signed)
 Urinal at bedside. Pt aware that a sample is needed. Pt was attempting to go when I left the room

## 2024-05-12 NOTE — Transfer of Care (Signed)
 Immediate Anesthesia Transfer of Care Note  Patient: Dean Murphy  Procedure(s) Performed: CYSTOSCOPY, WITH STENT INSERTION, RETROGRADE (Right)  Patient Location: PACU  Anesthesia Type:General  Level of Consciousness: awake  Airway & Oxygen Therapy: Patient Spontanous Breathing and Patient connected to nasal cannula oxygen  Post-op Assessment: Report given to RN and Post -op Vital signs reviewed and stable  Post vital signs: Reviewed and stable  Last Vitals:  Vitals Value Taken Time  BP    Temp    Pulse    Resp    SpO2      Last Pain:  Vitals:   05/12/24 1819  TempSrc: Oral  PainSc:          Complications: No notable events documented.

## 2024-05-12 NOTE — Anesthesia Preprocedure Evaluation (Addendum)
 "                                  Anesthesia Evaluation  Patient identified by MRN, date of birth, ID band Patient awake    Reviewed: Allergy & Precautions, NPO status , Patient's Chart, lab work & pertinent test results  History of Anesthesia Complications (+) PONV and history of anesthetic complications  Airway Mallampati: III  TM Distance: >3 FB Neck ROM: Full   Comment: Previous grade I view with McGrath 4, 2-person mask with OPA. Prior to that, grade I view with MAC 4 and easy mask. Dental  (+) Chipped, Dental Advisory Given,    Pulmonary neg shortness of breath, neg sleep apnea, neg COPD, neg recent URI, former smoker   Pulmonary exam normal breath sounds clear to auscultation       Cardiovascular hypertension (amlodipine , metoprolol , torsemide ), Pt. on medications (-) angina + CAD, + Past MI, + CABG (1989), + Peripheral Vascular Disease and +CHF  + dysrhythmias Atrial Fibrillation + pacemaker (complete heart block, [employer]) + Valvular Problems/Murmurs (mild) MR  Rhythm:Regular Rate:Normal  HLD; bilateral carotid artery disease s/p bilateral CEAs  TTE 12/25/2022: IMPRESSIONS    1. Left ventricular ejection fraction, by estimation, is 60 to 65%. The  left ventricle has normal function. The left ventricle has no regional  wall motion abnormalities. There is mild left ventricular hypertrophy.  Left ventricular diastolic parameters  are consistent with Grade I diastolic dysfunction (impaired relaxation).   2. Right ventricular systolic function is normal. The right ventricular  size is normal.   3. The mitral valve is normal in structure. Mild mitral valve  regurgitation. No evidence of mitral stenosis.   4. The aortic valve is tricuspid. There is mild calcification of the  aortic valve. There is mild thickening of the aortic valve. Aortic valve  regurgitation is not visualized. Aortic valve sclerosis is present, with  no evidence of aortic valve stenosis.    5. The inferior vena cava is normal in size with greater than 50%  respiratory variability, suggesting right atrial pressure of 3 mmHg.       Neuro/Psych neg Seizures Blind right eye; s/p kyphoplasty  Neuromuscular disease (lumbar radiculopathy) CVA (noted on MRI 2012), No Residual Symptoms    GI/Hepatic Neg liver ROS, hiatal hernia, PUD,GERD  Medicated,,Diverticulosis    Endo/Other  diabetes, Poorly Controlled, Type 2, Insulin  Dependent    Renal/GU CRF and ARFRenal disease     Musculoskeletal  (+) Arthritis ,  Osteoporosis, scoliosis    Abdominal   Peds  Hematology  (+) Blood dyscrasia (thrombocytopenia), anemia Lab Results      Component                Value               Date                      WBC                      8.2                 05/12/2024                HGB                      10.9 (L)  05/12/2024                HCT                      33.7 (L)            05/12/2024                MCV                      92.6                05/12/2024                PLT                      92 (L)              05/12/2024              Anesthesia Other Findings 89 y.o. male with medical history significant for HFpEF, CHB s/p PPM, paroxysmal A-fib not on OAC, constipation, CVA, NSTEMI/CAD s/p CABG, T2DM, chronic thrombocytopenia, anemia, PVD, osteoporosis, DDD and CKD stage IV who presented to the ED for evaluation of right flank pain and constipation.  CT A/P shows mild to moderate right hydronephrosis and hydroureter due to a 5 mm right ureteral calculus and a nonobstructing 3 mm right lower pole renal calculus  K initially 6.4. Received calcium  gluconate 1g IV, 5 units of insulin , and 1/2 amp of D50. Recheck in preop was 6.0. Additional 5 units of insulin  and 1/2 amp D50 to be given.  Reproductive/Obstetrics                              Anesthesia Physical Anesthesia Plan  ASA: 4  Anesthesia Plan: General   Post-op Pain Management:  Minimal or no pain anticipated   Induction: Intravenous  PONV Risk Score and Plan: 3 and Ondansetron  and Treatment may vary due to age or medical condition  Airway Management Planned: LMA  Additional Equipment:   Intra-op Plan:   Post-operative Plan: Extubation in OR  Informed Consent: I have reviewed the patients History and Physical, chart, labs and discussed the procedure including the risks, benefits and alternatives for the proposed anesthesia with the patient or authorized representative who has indicated his/her understanding and acceptance.     Dental advisory given  Plan Discussed with: CRNA and Anesthesiologist  Anesthesia Plan Comments: (Daughter-in-law present for consent. Risks of general anesthesia discussed including, but not limited to, sore throat, hoarse voice, chipped/damaged teeth, injury to vocal cords, nausea and vomiting, allergic reactions, lung infection, heart attack, stroke, and death. Discussed patient's increased risk of adverse cardiac events including cardiac arrest and death given he comorbidities and elevated potassium. All questions answered. )         Anesthesia Quick Evaluation  "

## 2024-05-12 NOTE — Anesthesia Procedure Notes (Signed)
 Procedure Name: LMA Insertion Date/Time: 05/12/2024 7:14 PM  Performed by: Judythe Tanda Aran, CRNAPre-anesthesia Checklist: Emergency Drugs available, Patient identified, Suction available and Patient being monitored Patient Re-evaluated:Patient Re-evaluated prior to induction Oxygen Delivery Method: Circle system utilized Preoxygenation: Pre-oxygenation with 100% oxygen Induction Type: IV induction LMA: LMA inserted LMA Size: 4.0 Number of attempts: 1 Placement Confirmation: positive ETCO2 and breath sounds checked- equal and bilateral Tube secured with: Tape Dental Injury: Teeth and Oropharynx as per pre-operative assessment

## 2024-05-12 NOTE — Consult Note (Addendum)
 Urology Consult   Physician requesting consult: [facility] ED   Reason for consult: right ureteral stone with ARF and atrophic contralateral kidney  History of Present Illness: Dean Murphy is a 89 y.o. male with PMH GERD, HLD, HTN, afib, heart block with pacemaker in place, prior MI, and CKD who presented to [facility] ED with several days of worsening abdominal and right flank/groin pain. Pt reports he has been unable to have a bowel movement for 3-4 days. His right flank and groin pain is intermittent. No fevers. No emesis.   CT in the ED is notable for 5 mm right mid-ureteral stone with associated hydronephrosis. Notably, patient's left kidney appears atrophic.  Labs notable for Cr 4.34 from recent baseline 2.5 and K 6.4. No leukocytosis. UA is pending, but patient denies dysuria, frequency, or other signs of infection.  Pt reports having a ureteral stent placed some years ago; he thinks this was for a kidney stone.   Past Medical History:  Diagnosis Date   (HFpEF) heart failure with preserved ejection fraction (HCC) 07/2014   a.) initially Dx'd in 07/2014 in setting of CHB; b.) TTE 09/02/2017: EF 55-60%, apical septal HK, G1DD, sev LAE, mild MR, PASP 41; c.) TTE 12/25/2022: EF 60-65%, mild LVH, G1DD, mild AoV sclerosis with no stenosis   Anemia due to chronic blood loss    Aortic atherosclerosis    Arthritis    AVM (arteriovenous malformation) of colon    a.)colonoscopy 08/10/2022: angioectasias (AVM) x 2 --> Tx'd with APC   Bilateral carotid artery disease    a.) s/p BILATERAL carotid endarterectomies: RIGHT (12/31/2010) and LEFT (02/09/2011); b.) carotid doppler 05/13/2023: 1-39% BICA with disturbed flow from the RIGHT subclavian.   Blindness of right eye    CAD (coronary artery disease)    a.) s/p 5v CABG 1989; b.) MV 01/02/2011: EF 75%, no ischemia   Cerebral microvascular disease    CHB (complete heart block) (HCC) 07/2014   a.) s/p St. Jude Assurity dual chamber PPM 08/13/2014   Chronic  BILATERAL L5 pars interarticularis defect    Chronic kidney disease, stage IV (severe) (HCC) 2015   Chronic prostatitis    Chronic radicular lumbar pain    Colon polyps    Complication of anesthesia    a.) PONV   Constipation    DDD (degenerative disc disease), thoracolumbar    Diverticulosis    DOE (dyspnea on exertion)    Dupuytren's disease of finger and palm of right hand    Essential hypertension    GERD (gastroesophageal reflux disease)    Gout    Hemorrhoids    Hiatal hernia    History of bilateral cataract extraction    HTN (hypertension)    Hyperlipidemia    Insomnia    Lacunar infarction (CVA) 01/01/2011   a,) noted on MRI brain 01/01/2011: remote lacunar infarcts of LEFT cerebellum and basal ganglia BILATERALLY   Myelolipoma of left adrenal gland    Myocardial infarction (HCC) 1976   a.) type/details unknown   NSTEMI (non-ST elevated myocardial infarction) (HCC) 08/11/2014   a.) troponins were trended: 0.35 --> 0.54 --> 0.54 ng/mL   Orthostasis    Osteoporosis    PAF (paroxysmal atrial fibrillation) (HCC) 07/2014   a.) CHA2DS2-VASc = 8 (age x2, sex, HFpEF, HTN, CVA x2, vascular disease, T2DM) as of 07/01/2023; b.) cardiac rate/rhythm maintained on oral metoprolol  succinate; no OAC   PONV (postoperative nausea and vomiting)    Positive ANA (antinuclear antibody)    a.)  workup with rheumatology (-) for SLE   Presence of permanent cardiac pacemaker 08/13/2014   a.) s/p St. Jude Assurity dual chamber PPM 08/13/2014 to LEFT infraclavicular deltopectoral region   PVD (peripheral vascular disease)    Recurrent falls    Right inguinal hernia    S/P CABG x 5 1989   Scoliosis    Symptomatic bradycardia    a.) s/p St. Jude Assurity dual chamber PPM 08/13/2014   Thoracic compression fracture (L1-L2)    a.) s/p L1 balloon kyphoplasty 08/13/2022   Thrombocytopenia    TMJ (dislocation of temporomandibular joint)    Type 2 diabetes mellitus treated with insulin  [facility])      Past Surgical History:  Procedure Laterality Date   CARDIAC CATHETERIZATION  08/11/2014   Procedure: TEMPORARY PACEMAKER;  Surgeon: Dorn JINNY Lesches, MD;  Location: [facility] CATH LAB;  Service: Cardiovascular;;   CAROTID ENDARTERECTOMY Right 12/31/2010   CAROTID ENDARTERECTOMY Left 02/09/2011   CATARACT EXTRACTION W/ INTRAOCULAR LENS IMPLANT Bilateral    CHOLECYSTECTOMY     COLONOSCOPY WITH PROPOFOL  N/A 08/10/2022   Procedure: COLONOSCOPY WITH PROPOFOL ;  Surgeon: Shila Gustav GAILS, MD;  Location: [facility] ENDOSCOPY;  Service: Gastroenterology;  Laterality: N/A;   CORONARY ARTERY BYPASS GRAFT  05/26/1987   x 5 vessels   ENDOSCOPIC RETROGRADE CHOLANGIOPANCREATOGRAPHY (ERCP) WITH PROPOFOL  N/A 01/27/2022   Procedure: ENDOSCOPIC RETROGRADE CHOLANGIOPANCREATOGRAPHY (ERCP) WITH PROPOFOL ;  Surgeon: Aneita Gwendlyn DASEN, MD;  Location: [facility] ENDOSCOPY;  Service: Gastroenterology;  Laterality: N/A;   ESOPHAGOGASTRODUODENOSCOPY N/A 08/09/2022   Procedure: ESOPHAGOGASTRODUODENOSCOPY (EGD);  Surgeon: Legrand Victory LITTIE DOUGLAS, MD;  Location: [facility] ENDOSCOPY;  Service: Gastroenterology;  Laterality: N/A;   HOT HEMOSTASIS N/A 08/10/2022   Procedure: HOT HEMOSTASIS (ARGON PLASMA COAGULATION/BICAP);  Surgeon: Nandigam, Kavitha V, MD;  Location: [facility] ENDOSCOPY;  Service: Gastroenterology;  Laterality: N/A;   I & D KNEE WITH POLY EXCHANGE Left 07/29/2015   Procedure: IRRIGATION AND DEBRIDEMENT LEFT KNEE WITH POLY EXCHANGE;  Surgeon: Redell Shoals, MD;  Location: [facility] OR;  Service: Orthopedics;  Laterality: Left;   INGUINAL HERNIA REPAIR Right 07/02/2023   Procedure: HERNIA REPAIR INGUINAL ADULT, open, RNFA to assist;  Surgeon: Jordis Laneta FALCON, MD;  Location: [facility] ORS;  Service: General;  Laterality: Right;   IR GENERIC HISTORICAL  03/04/2016   IR US  GUIDE VASC ACCESS RIGHT 03/04/2016 Rome Hall, MD [facility]-INTER[facility] RAD   IR GENERIC HISTORICAL  03/04/2016   IR FLUORO GUIDE CV LINE RIGHT 03/04/2016 Rome Hall, MD [facility]   IR KYPHO  LUMBAR INC FX REDUCE BONE BX UNI/BIL CANNULATION INC/IMAGING  08/13/2022   PERMANENT PACEMAKER INSERTION N/A 08/13/2014   STJ dual chamber pacemaker implanted by Dr Waddell for CHB   REMOVAL OF STONES  01/27/2022   Procedure: REMOVAL OF STONES;  Surgeon: Aneita Gwendlyn DASEN, MD;  Location: [facility]OS[facility];  Service: Gastroenterology;;   ANNETT  01/27/2022   Procedure: ANNETT;  Surgeon: Aneita Gwendlyn DASEN, MD;  Location: [facility] ENDOSCOPY;  Service: Gastroenterology;;   TONSILLECTOMY     TOTAL KNEE ARTHROPLASTY Bilateral     Current Hospital Medications:  Home Meds: Medications Ordered Prior to Encounter[1]   Scheduled Meds:  oxyCODONE -acetaminophen   1 tablet Oral Once   Continuous Infusions: PRN Meds:.ondansetron   Allergies: Allergies[2]  Family History  Problem Relation Age of Onset   Heart disease Mother        Before age 20   Diabetes Mother    Varicose Veins Mother    Heart attack Mother    Heart attack Father  Heart disease Father        After age 1   Hypertension Father    Heart disease Brother        Before age 67   Hypertension Brother    Heart attack Brother    Diabetes Son    Hypertension Son    Hypertension Son    Diabetes Son    Diabetes Other    Cancer Other    Colon cancer Neg Hx     Social History:  reports that he quit smoking about 51 years ago. His smoking use included cigarettes. He started smoking about 76 years ago. He has a 25 pack-year smoking history. He has been exposed to tobacco smoke. He has never used smokeless tobacco. He reports current alcohol  use. He reports that he does not use drugs.  ROS: A complete review of systems was performed.  All systems are negative except for pertinent findings as noted.  Physical Exam:  Vital signs in last 24 hours: Temp:  [97.7 F (36.5 C)] 97.7 F (36.5 C) (12/19 1412) Pulse Rate:  [72] 72 (12/19 1412) Resp:  [20] 20 (12/19 1412) BP: (147)/(51) 147/51 (12/19 1412) SpO2:  [98 %] 98 %  (12/19 1412) Constitutional:  Alert and oriented, No acute distress. Comfortable appearing  Cardiovascular: Regular rate and rhythm, No JVD Respiratory: Normal respiratory effort, Lungs clear bilaterally GI: Abdomen is soft, nontender, nondistended, no abdominal masses GU: Mild left CVA tenderness Lymphatic: No lymphadenopathy Neurologic: Grossly intact, no focal deficits Psychiatric: Normal mood and affect  Laboratory Data:  Recent Labs    05/12/24 1558  WBC 8.2  HGB 10.9*  HCT 33.7*  PLT 92*    Recent Labs    05/12/24 1558  NA 142  K 6.4*  CL 110  GLUCOSE 175*  BUN 61*  CALCIUM  9.4  CREATININE 4.34*     Results for orders placed or performed during the hospital encounter of 05/12/24 (from the past 24 hours)  Comprehensive metabolic panel     Status: Abnormal   Collection Time: 05/12/24  3:58 PM  Result Value Ref Range   Sodium 142 135 - 145 mmol/L   Potassium 6.4 (HH) 3.5 - 5.1 mmol/L   Chloride 110 98 - 111 mmol/L   CO2 22 22 - 32 mmol/L   Glucose, Bld 175 (H) 70 - 99 mg/dL   BUN 61 (H) 8 - 23 mg/dL   Creatinine, Ser 5.65 (H) 0.61 - 1.24 mg/dL   Calcium  9.4 8.9 - 10.3 mg/dL   Total Protein 6.6 6.5 - 8.1 g/dL   Albumin 3.6 3.5 - 5.0 g/dL   AST 43 (H) 15 - 41 U/L   ALT 49 (H) 0 - 44 U/L   Alkaline Phosphatase 307 (H) 38 - 126 U/L   Total Bilirubin 0.4 0.0 - 1.2 mg/dL   GFR, Estimated 12 (L) >60 mL/min   Anion gap 10 5 - 15  Lipase, blood     Status: None   Collection Time: 05/12/24  3:58 PM  Result Value Ref Range   Lipase 25 11 - 51 U/L  CBC with Diff     Status: Abnormal   Collection Time: 05/12/24  3:58 PM  Result Value Ref Range   WBC 8.2 4.0 - 10.5 K/uL   RBC 3.64 (L) 4.22 - 5.81 MIL/uL   Hemoglobin 10.9 (L) 13.0 - 17.0 g/dL   HCT 66.2 (L) 60.9 - 47.9 %   MCV 92.6 80.0 - 100.0 fL   MCH  29.9 26.0 - 34.0 pg   MCHC 32.3 30.0 - 36.0 g/dL   RDW 86.1 88.4 - 84.4 %   Platelets 92 (L) 150 - 400 K/uL   nRBC 0.0 0.0 - 0.2 %   Neutrophils Relative % 76  %   Neutro Abs 6.2 1.7 - 7.7 K/uL   Lymphocytes Relative 14 %   Lymphs Abs 1.1 0.7 - 4.0 K/uL   Monocytes Relative 9 %   Monocytes Absolute 0.7 0.1 - 1.0 K/uL   Eosinophils Relative 1 %   Eosinophils Absolute 0.1 0.0 - 0.5 K/uL   Basophils Relative 0 %   Basophils Absolute 0.0 0.0 - 0.1 K/uL   WBC Morphology See Note    Smear Review Normal platelet morphology    Immature Granulocytes 0 %   Abs Immature Granulocytes 0.03 0.00 - 0.07 K/uL   Burr Cells PRESENT    No results found for this or any previous visit (from the past 240 hours).  Renal Function: Recent Labs    05/12/24 1558  CREATININE 4.34*   Estimated Creatinine Clearance: 10.8 mL/min (A) (by C-G formula based on SCr of 4.34 mg/dL (H)).  Radiologic Imaging: CT ABDOMEN PELVIS WO CONTRAST Result Date: 05/12/2024 EXAM: CT ABDOMEN AND PELVIS WITHOUT CONTRAST 05/12/2024 03:37:34 PM TECHNIQUE: CT of the abdomen and pelvis was performed without the administration of intravenous contrast. Multiplanar reformatted images are provided for review. Automated exposure control, iterative reconstruction, and/or weight-based adjustment of the mA/kV was utilized to reduce the radiation dose to as low as reasonably achievable. COMPARISON: 04/25/2020 and ultrasound from 01/19/2022. CLINICAL HISTORY: Constipation, right lower quadrant abdominal pain and right flank pain. FINDINGS: LOWER CHEST: Nonspecific peripheral interstitial accentuation at the lung bases. Mild aortic and mitral valve calcification. Pacer leads noted. Prior median sternotomy. LIVER: Slightly lobulated contour of the liver, cirrhosis not excluded. GALLBLADDER AND BILE DUCTS: Cholecystectomy. No biliary ductal dilatation. SPLEEN: No acute abnormality. PANCREAS: No acute abnormality. ADRENAL GLANDS: 1.4 x 2.1 cm left adrenal myelolipoma on image 34 series 2. KIDNEYS, URETERS AND BLADDER: Mild to moderate right hydronephrosis and hydroureter extending down to a 5 mm in length  mediastinal calculus just below the level of the iliac crests as shown on image 79 series 7. The right ureter distal to this point is of normal caliber. 3 mm in length right kidney lower pole nonobstructive renal calculus. No stones in the left kidney or left ureter. No left hydronephrosis or hydroureter. No perinephric or periureteral stranding. Urinary bladder is unremarkable. GI AND BOWEL: Stomach demonstrates no acute abnormality. Sigmoid colon diverticulosis. Overall stool burden is within normal limits. There is no bowel obstruction. PERITONEUM AND RETROPERITONEUM: No ascites. No free air. VASCULATURE: Aortic and coronary atherosclerosis. Atherosclerosis of the abdominal aorta and its branches including the celiac trunk and proximal superior mesenteric artery demonstrate a substantial atheromatous plaque. Patency not assessed on today's noncontrast examination. LYMPH NODES: No lymphadenopathy. REPRODUCTIVE ORGANS: Fatty left spermatic cord. BONES AND SOFT TISSUES: Right posterolateral 11th rib nonunited fracture. Old healed bilateral lower rib fractures. Prior L1 vertebral augmentation. Chronic bilateral pars defects at L5 with grade 1 anterolisthesis of L5 on S1 and a resulting right foraminal impingement at L5-S1. Prior right inguinal hernia repair with some mild prominence of fatty tissues in the vicinity. No focal soft tissue abnormality. IMPRESSION: 1. Mild to moderate right hydronephrosis and hydroureter secondary to a 5 mm right ureteral calculus just below the level of the iliac crests, with normal caliber of the distal right ureter.  2. Nonobstructive 3 mm right lower pole renal calculus. 3. Slightly lobulated hepatic contour, cirrhosis not excluded. 4. Sigmoid colon diverticulosis without evidence of diverticulitis. 5. Chronic bilateral pars defects at L5 with grade 1 anterolisthesis of L5 on S1 and resulting right foraminal impingement at L5-S1, with prior L1 vertebral augmentation. 6. Additional  chronic and incidental findings include aortic and coronary atherosclerosis with atherosclerosis of the abdominal aorta and its branches, mild aortic and mitral valve calcification, pacer leads, nonspecific peripheral interstitial accentuation at the lung bases, cholecystectomy, prior right inguinal hernia repair with mild fat prominence in the region and fatty left spermatic cord, prior median sternotomy, chronic nonunited right posterolateral 11th rib fracture, and old healed bilateral lower rib fractures. Electronically signed by: Ryan Salvage MD 05/12/2024 04:01 PM EST RP Workstation: [id number]    I independently reviewed the above imaging studies.  Impression/Recommendation Pt is a 89 yo male with PMH as above who presented to [facility] ED with right flank and groin pain and was found to have an obstructing 5 mm right ureteral stone. Labs concerning for acute on chronic renal failure with hyperkalemia to 6.4; Cr 4.34 from recent baseline 2.5. Patient's left kidney appears atrophic on CT, so decline in renal function is likely 2/2 unilateral obstruction.  We discussed the indications for acute intervention including infected obstruction, bilateral ureteral obstruction or unilateral obstruction of solitary kidney as well as other less urgent indications for decompression which would included intractable pain, N/V, and acute renal injury. We also discussed the possible inability to place a ureteral stent in which case, the next intervention recommended would be a percutaneous nephrostomy tube. Given acute renal failure with hyperkalemia and atrophic contralateral kidney is present (functionally solitary kidney), plan for urgent right ureteral stent placement.  Recommendations: - Plan for right ureteral stent placement - Keep patient NPO, continue IVF  - Consent to be obtained from patient - Send urine culture at this time, prior to OR   Maurilio Agar 05/12/2024, 6:01 PM   I have seen and examined  the patient and agree with the above assessment and plan.  Agree with right stent. Will arrange for definitive f/u for treatment.  -The risks, benefits and alternatives of cystoscopy with right JJ stent placement was discussed with the patient.  Risks include, but are not limited to: bleeding, urinary tract infection, ureteral injury, ureteral stricture disease, chronic pain, urinary symptoms, bladder injury, stent migration, the need for nephrostomy tube placement, MI, CVA, DVT, PE and the inherent risks with general anesthesia.  The patient voices understanding and wishes to proceed.   Matt R. Kaydan Wilhoite MD [facility]  Pager: [phone]     [1]  No current facility-administered medications on file prior to encounter.   Current Outpatient Medications on File Prior to Encounter  Medication Sig Dispense Refill   amLODipine  (NORVASC ) 10 MG tablet Take 10 mg by mouth daily.     amoxicillin  (AMOXIL ) 500 MG capsule Take 1 capsule (500 mg total) by mouth 2 (two) times daily. 180 capsule 1   B-D UF III MINI PEN NEEDLES 31G X 5 MM MISC USE DAILY WITH INSULIN  PEN 100 each 4   cholecalciferol (VITAMIN D3) 25 MCG (1000 UNIT) tablet Take 1,000 Units by mouth daily.     clotrimazole -betamethasone  (LOTRISONE ) cream APPLY TOPICALLY 2 TIMES DAILY AS NEEDED. 30 g 1   cyanocobalamin  (VITAMIN B12) 1000 MCG tablet Take 1 tablet (1,000 mcg total) by mouth daily.     dorzolamide-timolol (COSOPT) 22.3-6.8 MG/ML ophthalmic solution  Place 1 drop into both eyes daily in the afternoon. In both eye     insulin  glargine (LANTUS  SOLOSTAR) 100 UNIT/ML Solostar Pen INJECT 46 UNITS INTO THE SKIN DAILY. 45 mL 2   Iron , Ferrous Sulfate , 325 (65 Fe) MG TABS Take 325 mg by mouth 3 (three) times a week.     metoprolol  succinate (TOPROL  XL) 25 MG 24 hr tablet Take 1 tablet (25 mg total) by mouth at bedtime. 90 tablet 3   Multiple Vitamins-Minerals (PRESERVISION AREDS 2) CAPS Take 1 capsule by mouth daily.     pantoprazole   (PROTONIX ) 40 MG tablet TAKE 1 TABLET BY MOUTH TWICE A DAY 180 tablet 3   rosuvastatin  (CRESTOR ) 20 MG tablet Take 1 tablet (20 mg total) by mouth daily. 90 tablet 3   torsemide  (DEMADEX ) 10 MG tablet Take 1 tablet (10 mg total) by mouth 2 (two) times a week. Monday and Friday (Patient taking differently: Take 10 mg by mouth 3 (three) times a week. Monday Wed and Friday)     vitamin C  (ASCORBIC ACID) 250 MG tablet Take 250 mg by mouth 3 (three) times a week. M-W-F    [2]  Allergies Allergen Reactions   Zoledronic  Acid Other (See Comments)    Discontinued in 2015 due to progressive decrease in renal function (CKD-III/IV)   Lokelma  [Sodium Zirconium Cyclosilicate ]     itching   Nsaids Other (See Comments)    Held due to creatinine elevation.

## 2024-05-12 NOTE — H&P (Addendum)
 " History and Physical  Dean Murphy FMW:[id number] DOB: September 14, 1926 DOA: 05/12/2024  PCP: Cleatus Arlyss RAMAN, MD   Chief Complaint: Flank pain, constipation  HPI: Dean Murphy is a 89 y.o. male with medical history significant for HFpEF, CHB s/p PPM, paroxysmal A-fib not on OAC, constipation, CVA, NSTEMI/CAD s/p CABG, T2DM, chronic thrombocytopenia, anemia, PVD, osteoporosis, DDD and CKD stage IV who presented to the ED for evaluation of right flank pain and constipation. Patient reports he has had worsening abdominal pain and right flank pain intermittently over the last week. States he he has not had a bowel movement for the last 3 days. He denies any fevers, chills, nausea, vomiting, chest pain, weakness, muscle aches, shortness of breath or dysuria.  ED Course: Initial vitals show patient afebrile slightly hypertensive with SBP in the 140s to 150s. Initial labs significant for K+ 6.4, BUNs/creatinine 61/4.34, alk phos 307, WBC 8.2, Hgb 10.9, platelet 92, normal lipase. CT A/P shows mild to moderate right hydronephrosis and hydroureter due to a 5 mm right ureteral calculus and a nonobstructing 3 mm right lower pole renal calculus. Pt received IV morphine , IV Zofran  and temporizing measures for hyperkalemia including IV calcium  gluconate, insulin  and dextrose . Urology was consulted for evaluation. [facility] was consulted for admission.   Review of Systems: Please see HPI for pertinent positives and negatives. A complete 10 system review of systems are otherwise negative.  Past Medical History:  Diagnosis Date   (HFpEF) heart failure with preserved ejection fraction (HCC) 07/2014   a.) initially Dx'd in 07/2014 in setting of CHB; b.) TTE 09/02/2017: EF 55-60%, apical septal HK, G1DD, sev LAE, mild MR, PASP 41; c.) TTE 12/25/2022: EF 60-65%, mild LVH, G1DD, mild AoV sclerosis with no stenosis   Anemia due to chronic blood loss    Aortic atherosclerosis    Arthritis    AVM (arteriovenous malformation) of  colon    a.)colonoscopy 08/10/2022: angioectasias (AVM) x 2 --> Tx'd with APC   Bilateral carotid artery disease    a.) s/p BILATERAL carotid endarterectomies: RIGHT (12/31/2010) and LEFT (02/09/2011); b.) carotid doppler 05/13/2023: 1-39% BICA with disturbed flow from the RIGHT subclavian.   Blindness of right eye    CAD (coronary artery disease)    a.) s/p 5v CABG 1989; b.) MV 01/02/2011: EF 75%, no ischemia   Cerebral microvascular disease    CHB (complete heart block) (HCC) 07/2014   a.) s/p St. Jude Assurity dual chamber PPM 08/13/2014   Chronic BILATERAL L5 pars interarticularis defect    Chronic kidney disease, stage IV (severe) (HCC) 2015   Chronic prostatitis    Chronic radicular lumbar pain    Colon polyps    Complication of anesthesia    a.) PONV   Constipation    DDD (degenerative disc disease), thoracolumbar    Diverticulosis    DOE (dyspnea on exertion)    Dupuytren's disease of finger and palm of right hand    Essential hypertension    GERD (gastroesophageal reflux disease)    Gout    Hemorrhoids    Hiatal hernia    History of bilateral cataract extraction    HTN (hypertension)    Hyperlipidemia    Insomnia    Lacunar infarction (CVA) 01/01/2011   a,) noted on MRI brain 01/01/2011: remote lacunar infarcts of LEFT cerebellum and basal ganglia BILATERALLY   Myelolipoma of left adrenal gland    Myocardial infarction (HCC) 1976   a.) type/details unknown   NSTEMI (non-ST elevated myocardial  infarction) (HCC) 08/11/2014   a.) troponins were trended: 0.35 --> 0.54 --> 0.54 ng/mL   Orthostasis    Osteoporosis    PAF (paroxysmal atrial fibrillation) (HCC) 07/2014   a.) CHA2DS2-VASc = 8 (age x2, sex, HFpEF, HTN, CVA x2, vascular disease, T2DM) as of 07/01/2023; b.) cardiac rate/rhythm maintained on oral metoprolol  succinate; no OAC   PONV (postoperative nausea and vomiting)    Positive ANA (antinuclear antibody)    a.) workup with rheumatology (-) for SLE   Presence  of permanent cardiac pacemaker 08/13/2014   a.) s/p St. Jude Assurity dual chamber PPM 08/13/2014 to LEFT infraclavicular deltopectoral region   PVD (peripheral vascular disease)    Recurrent falls    Right inguinal hernia    S/P CABG x 5 1989   Scoliosis    Symptomatic bradycardia    a.) s/p St. Jude Assurity dual chamber PPM 08/13/2014   Thoracic compression fracture (L1-L2)    a.) s/p L1 balloon kyphoplasty 08/13/2022   Thrombocytopenia    TMJ (dislocation of temporomandibular joint)    Type 2 diabetes mellitus treated with insulin  [facility])    Past Surgical History:  Procedure Laterality Date   CARDIAC CATHETERIZATION  08/11/2014   Procedure: TEMPORARY PACEMAKER;  Surgeon: Dorn JINNY Lesches, MD;  Location: [facility] CATH LAB;  Service: Cardiovascular;;   CAROTID ENDARTERECTOMY Right 12/31/2010   CAROTID ENDARTERECTOMY Left 02/09/2011   CATARACT EXTRACTION W/ INTRAOCULAR LENS IMPLANT Bilateral    CHOLECYSTECTOMY     COLONOSCOPY WITH PROPOFOL  N/A 08/10/2022   Procedure: COLONOSCOPY WITH PROPOFOL ;  Surgeon: Shila Gustav GAILS, MD;  Location: [facility] ENDOSCOPY;  Service: Gastroenterology;  Laterality: N/A;   CORONARY ARTERY BYPASS GRAFT  05/26/1987   x 5 vessels   ENDOSCOPIC RETROGRADE CHOLANGIOPANCREATOGRAPHY (ERCP) WITH PROPOFOL  N/A 01/27/2022   Procedure: ENDOSCOPIC RETROGRADE CHOLANGIOPANCREATOGRAPHY (ERCP) WITH PROPOFOL ;  Surgeon: Aneita Gwendlyn DASEN, MD;  Location: [facility] ENDOSCOPY;  Service: Gastroenterology;  Laterality: N/A;   ESOPHAGOGASTRODUODENOSCOPY N/A 08/09/2022   Procedure: ESOPHAGOGASTRODUODENOSCOPY (EGD);  Surgeon: Legrand Victory LITTIE DOUGLAS, MD;  Location: [facility] ENDOSCOPY;  Service: Gastroenterology;  Laterality: N/A;   HOT HEMOSTASIS N/A 08/10/2022   Procedure: HOT HEMOSTASIS (ARGON PLASMA COAGULATION/BICAP);  Surgeon: Nandigam, Kavitha V, MD;  Location: [facility] ENDOSCOPY;  Service: Gastroenterology;  Laterality: N/A;   I & D KNEE WITH POLY EXCHANGE Left 07/29/2015   Procedure: IRRIGATION AND  DEBRIDEMENT LEFT KNEE WITH POLY EXCHANGE;  Surgeon: Redell Shoals, MD;  Location: [facility] OR;  Service: Orthopedics;  Laterality: Left;   INGUINAL HERNIA REPAIR Right 07/02/2023   Procedure: HERNIA REPAIR INGUINAL ADULT, open, RNFA to assist;  Surgeon: Jordis Laneta FALCON, MD;  Location: [facility] ORS;  Service: General;  Laterality: Right;   IR GENERIC HISTORICAL  03/04/2016   IR US  GUIDE VASC ACCESS RIGHT 03/04/2016 Rome Hall, MD [facility]-INTER[facility] RAD   IR GENERIC HISTORICAL  03/04/2016   IR FLUORO GUIDE CV LINE RIGHT 03/04/2016 Rome Hall, MD [facility]   IR KYPHO LUMBAR INC FX REDUCE BONE BX UNI/BIL CANNULATION INC/IMAGING  08/13/2022   PERMANENT PACEMAKER INSERTION N/A 08/13/2014   STJ dual chamber pacemaker implanted by Dr Waddell for CHB   REMOVAL OF STONES  01/27/2022   Procedure: REMOVAL OF STONES;  Surgeon: Aneita Gwendlyn DASEN, MD;  Location: [facility];  Service: Gastroenterology;;   ANNETT  01/27/2022   Procedure: ANNETT;  Surgeon: Aneita Gwendlyn DASEN, MD;  Location: [facility] ENDOS[facility];  Service: Gastroenterology;;   TONSILLECTOMY     TOTAL KNEE ARTHROPLASTY Bilateral    Social History:  reports that he quit smoking about 51 years ago. His smoking use included cigarettes. He started smoking about 76 years ago. He has a 25 pack-year smoking history. He has been exposed to tobacco smoke. He has never used smokeless tobacco. He reports current alcohol  use. He reports that he does not use drugs.  Allergies[1]  Family History  Problem Relation Age of Onset   Heart disease Mother        Before age 35   Diabetes Mother    Varicose Veins Mother    Heart attack Mother    Heart attack Father    Heart disease Father        After age 66   Hypertension Father    Heart disease Brother        Before age 15   Hypertension Brother    Heart attack Brother    Diabetes Son    Hypertension Son    Hypertension Son    Diabetes Son    Diabetes Other    Cancer Other    Colon cancer Neg Hx       Prior to Admission medications  Medication Sig Start Date End Date Taking? Authorizing Provider  amLODipine  (NORVASC ) 10 MG tablet Take 10 mg by mouth daily.    [provider]  amoxicillin  (AMOXIL ) 500 MG capsule Take 1 capsule (500 mg total) by mouth 2 (two) times daily. 10/27/23   Cleatus Arlyss RAMAN, MD  B-D UF III MINI PEN NEEDLES 31G X 5 MM MISC USE DAILY WITH INSULIN  PEN 03/01/23   Cleatus Arlyss RAMAN, MD  cholecalciferol (VITAMIN D3) 25 MCG (1000 UNIT) tablet Take 1,000 Units by mouth daily.    [provider]  clotrimazole -betamethasone  (LOTRISONE ) cream APPLY TOPICALLY 2 TIMES DAILY AS NEEDED. 06/29/23   Cleatus Arlyss RAMAN, MD  cyanocobalamin  (VITAMIN B12) 1000 MCG tablet Take 1 tablet (1,000 mcg total) by mouth daily. 05/30/23   Cleatus Arlyss RAMAN, MD  dorzolamide-timolol (COSOPT) 22.3-6.8 MG/ML ophthalmic solution Place 1 drop into both eyes daily in the afternoon. In both eye 01/16/22   [provider]  insulin  glargine (LANTUS  SOLOSTAR) 100 UNIT/ML Solostar Pen INJECT 46 UNITS INTO THE SKIN DAILY. 02/23/24   Cleatus Arlyss RAMAN, MD  Iron , Ferrous Sulfate , 325 (65 Fe) MG TABS Take 325 mg by mouth 3 (three) times a week. 06/18/23   Cleatus Arlyss RAMAN, MD  metoprolol  succinate (TOPROL  XL) 25 MG 24 hr tablet Take 1 tablet (25 mg total) by mouth at bedtime. 06/28/23   Croitoru, Mihai, MD  Multiple Vitamins-Minerals (PRESERVISION AREDS 2) CAPS Take 1 capsule by mouth daily.    [provider]  pantoprazole  (PROTONIX ) 40 MG tablet TAKE 1 TABLET BY MOUTH TWICE A DAY 06/29/23   Cleatus Arlyss RAMAN, MD  rosuvastatin  (CRESTOR ) 20 MG tablet Take 1 tablet (20 mg total) by mouth daily. 06/28/23   Croitoru, Mihai, MD  torsemide  (DEMADEX ) 10 MG tablet Take 1 tablet (10 mg total) by mouth 2 (two) times a week. Monday and Friday Patient taking differently: Take 10 mg by mouth 3 (three) times a week. Monday Wed and Friday 12/18/21   Cleatus Arlyss RAMAN, MD  vitamin C  (ASCORBIC ACID) 250 MG tablet Take  250 mg by mouth 3 (three) times a week. M-W-F    [provider]    Physical Exam: BP (!) 154/55 (BP Location: Left Arm)   Pulse 69   Temp 98 F (36.7 C) (Oral)   Resp 18   Ht 5'  11 (1.803 m)   Wt 82.5 kg   SpO2 95%   BMI 25.37 kg/m  General: Pleasant, well-appearing elderly man laying in bed. No acute distress. HEENT: Learned/AT. Anicteric sclera CV: RRR. No murmurs, rubs, or gallops. No LE edema Pulmonary: Lungs CTAB. Normal effort. No wheezing or rales. Abdominal: Soft, mild tenderness to palpation of the RLQ and right flank. Normal bowel sounds. Extremities: Palpable radial and DP pulses. Normal ROM. Skin: Warm and dry. No obvious rash or lesions. Neuro: A&Ox3. Moves all extremities. Normal sensation to light touch. No focal deficit. Psych: Normal mood and affect          Labs on Admission:  Basic Metabolic Panel: Recent Labs  Lab 05/12/24 1558 05/12/24 1840 05/12/24 2204  NA 142 141 140  K 6.4* 6.0* 5.8*  CL 110 115* 112*  CO2 22  --  17*  GLUCOSE 175* 187* 148*  BUN 61* 59* 57*  CREATININE 4.34* 5.00* 4.10*  CALCIUM  9.4  --  9.0   Liver Function Tests: Recent Labs  Lab 05/12/24 1558  AST 43*  ALT 49*  ALKPHOS 307*  BILITOT 0.4  PROT 6.6  ALBUMIN 3.6   Recent Labs  Lab 05/12/24 1558  LIPASE 25   No results for input(s): AMMONIA in the last 168 hours. CBC: Recent Labs  Lab 05/12/24 1558 05/12/24 1840  WBC 8.2  --   NEUTROABS 6.2  --   HGB 10.9* 10.9*  HCT 33.7* 32.0*  MCV 92.6  --   PLT 92*  --    Cardiac Enzymes: No results for input(s): CKTOTAL, CKMB, CKMBINDEX, TROPONINI in the last 168 hours. BNP (last 3 results) No results for input(s): BNP in the last 8760 hours.  ProBNP (last 3 results) Recent Labs    05/28/23 1050  PROBNP 348.0*    CBG: Recent Labs  Lab 05/12/24 1822 05/12/24 2005  GLUCAP 192* 136*    Radiological Exams on Admission: DG C-Arm 1-60 Min-No Report Result Date:  05/12/2024 Fluoroscopy was utilized by the requesting physician.  No radiographic interpretation.   CT ABDOMEN PELVIS WO CONTRAST Result Date: 05/12/2024 EXAM: CT ABDOMEN AND PELVIS WITHOUT CONTRAST 05/12/2024 03:37:34 PM TECHNIQUE: CT of the abdomen and pelvis was performed without the administration of intravenous contrast. Multiplanar reformatted images are provided for review. Automated exposure control, iterative reconstruction, and/or weight-based adjustment of the mA/kV was utilized to reduce the radiation dose to as low as reasonably achievable. COMPARISON: 04/25/2020 and ultrasound from 01/19/2022. CLINICAL HISTORY: Constipation, right lower quadrant abdominal pain and right flank pain. FINDINGS: LOWER CHEST: Nonspecific peripheral interstitial accentuation at the lung bases. Mild aortic and mitral valve calcification. Pacer leads noted. Prior median sternotomy. LIVER: Slightly lobulated contour of the liver, cirrhosis not excluded. GALLBLADDER AND BILE DUCTS: Cholecystectomy. No biliary ductal dilatation. SPLEEN: No acute abnormality. PANCREAS: No acute abnormality. ADRENAL GLANDS: 1.4 x 2.1 cm left adrenal myelolipoma on image 34 series 2. KIDNEYS, URETERS AND BLADDER: Mild to moderate right hydronephrosis and hydroureter extending down to a 5 mm in length mediastinal calculus just below the level of the iliac crests as shown on image 79 series 7. The right ureter distal to this point is of normal caliber. 3 mm in length right kidney lower pole nonobstructive renal calculus. No stones in the left kidney or left ureter. No left hydronephrosis or hydroureter. No perinephric or periureteral stranding. Urinary bladder is unremarkable. GI AND BOWEL: Stomach demonstrates no acute abnormality. Sigmoid colon diverticulosis. Overall stool burden is within normal  limits. There is no bowel obstruction. PERITONEUM AND RETROPERITONEUM: No ascites. No free air. VASCULATURE: Aortic and coronary atherosclerosis.  Atherosclerosis of the abdominal aorta and its branches including the celiac trunk and proximal superior mesenteric artery demonstrate a substantial atheromatous plaque. Patency not assessed on today's noncontrast examination. LYMPH NODES: No lymphadenopathy. REPRODUCTIVE ORGANS: Fatty left spermatic cord. BONES AND SOFT TISSUES: Right posterolateral 11th rib nonunited fracture. Old healed bilateral lower rib fractures. Prior L1 vertebral augmentation. Chronic bilateral pars defects at L5 with grade 1 anterolisthesis of L5 on S1 and a resulting right foraminal impingement at L5-S1. Prior right inguinal hernia repair with some mild prominence of fatty tissues in the vicinity. No focal soft tissue abnormality. IMPRESSION: 1. Mild to moderate right hydronephrosis and hydroureter secondary to a 5 mm right ureteral calculus just below the level of the iliac crests, with normal caliber of the distal right ureter. 2. Nonobstructive 3 mm right lower pole renal calculus. 3. Slightly lobulated hepatic contour, cirrhosis not excluded. 4. Sigmoid colon diverticulosis without evidence of diverticulitis. 5. Chronic bilateral pars defects at L5 with grade 1 anterolisthesis of L5 on S1 and resulting right foraminal impingement at L5-S1, with prior L1 vertebral augmentation. 6. Additional chronic and incidental findings include aortic and coronary atherosclerosis with atherosclerosis of the abdominal aorta and its branches, mild aortic and mitral valve calcification, pacer leads, nonspecific peripheral interstitial accentuation at the lung bases, cholecystectomy, prior right inguinal hernia repair with mild fat prominence in the region and fatty left spermatic cord, prior median sternotomy, chronic nonunited right posterolateral 11th rib fracture, and old healed bilateral lower rib fractures. Electronically signed by: Ryan Salvage MD 05/12/2024 04:01 PM EST RP Workstation: [id number]   My independent interpretation of EKG:  Ventricularly-paced rhythm with LVH  Assessment/Plan Dean Murphy is a 89 y.o. male with medical history significant for HFpEF, CHB s/p PPM, paroxysmal A-fib not on OAC, constipation, CVA, NSTEMI/CAD s/p CABG, T2DM, chronic thrombocytopenia, anemia, PVD, osteoporosis, DDD and CKD stage IV who presented to the ED for evaluation of right flank pain and constipation and admitted for acute on chronic renal failure.  # AKI on CKD stage IV # Acute on chronic renal failure - Creatinine elevated to 4.34 from baseline of around 2.2-2.5 - Likely combination of prerenal and postrenal - Start IV NS 100 cc/h for 1 day - Status post ureteral stent placement by urology - Trend renal function avoid nephrotoxic agents  # Hyperkalemia - Patient found to have K+ of 6.4 on admission secondary to worsening renal failure - Status post temporizing measures with IV calcium  gluconate and insulin  in the ED with repeat BMP showing K+ of 5.8 and bicarb 17 - Patient allergic to Lokelma , give bicarb 1300 mg x1 and albuterol neb x 1 - Follow-up morning K+  # Hydronephrosis # Ureteral calculus - Presented with 1 week of right flank pain found to have mild to moderate right hydronephrosis and hydroureter secondary to a 5 mm right ureteral calculus on imaging - Patient is status post urgent ureteral stent placement by urology - Continue Foley catheter  # Constipation - Patient without bowel movement over the last 3 days - Start daily Senokot-S, MiraLAX  with as needed Dulcolax  # T2DM with hyperglycemia - Last A1c 9.4% 3 months ago, blood sugar in the 140s to 170s - Home regimen includes Lantus  38 units at bedtime - Semglee  38 units daily at bedtime - SSI with meals, CBG monitoring - Follow-up repeat A1c  # HFpEF - Patient  euvolemic on exam - Hold torsemide  in the setting of AKI  # HTN - BP elevated with SBP in the 130s to 150s likely secondary to pain - Continue amlodipine  and Toprol -XL  # CHB s/p PPM #  Paroxysmal A-fib - Paced rhythm, patient not on anticoagulation - Continue on Toprol  XL - Telemetry  # HLD # CAD s/p CABG # Hx of CVA - Continue rosuvastatin   # Normocytic anemia - Hgb stable at 10.9, continue iron  supplementation  # Chronic thrombocytopenia - Platelet 92 around baseline of 90-100 - Trend CBC  # GERD - Continue Protonix   DVT prophylaxis: TED hose    Code Status: Full Code  Consults called: Urology  Family Communication: Discussed results/findings and plan for admission with spouse at bedside  Severity of Illness: The appropriate patient status for this patient is INPATIENT. Inpatient status is judged to be reasonable and necessary in order to provide the required intensity of service to ensure the patient's safety. The patient's presenting symptoms, physical exam findings, and initial radiographic and laboratory data in the context of their chronic comorbidities is felt to place them at high risk for further clinical deterioration. Furthermore, it is not anticipated that the patient will be medically stable for discharge from the hospital within 2 midnights of admission.   * I certify that at the point of admission it is my clinical judgment that the patient will require inpatient hospital care spanning beyond 2 midnights from the point of admission due to high intensity of service, high risk for further deterioration and high frequency of surveillance required.*  Level of care: Telemetry   I personally spent a total of 76 minutes in the care of the patient today including preparing to see the patient, getting/reviewing separately obtained history, performing a medically appropriate exam/evaluation, placing orders, documenting clinical information in the EHR, independently interpreting results, and communicating results.   Lou Claretta HERO, MD 05/12/2024, 11:18 PM [facility] Pager: [phone] Isaiah 41:10   If 7PM-7AM, please contact  night-coverage [email or web address] Password [email or web address]     [1]  Allergies Allergen Reactions   Zoledronic  Acid Other (See Comments)    Discontinued in 2015 due to progressive decrease in renal function (CKD-III/IV)   Lokelma  [Sodium Zirconium Cyclosilicate ]     itching   Nsaids Other (See Comments)    Held due to creatinine elevation.     "

## 2024-05-12 NOTE — ED Triage Notes (Signed)
 Patient presents from home due to constipation, right lower quadrant abdominal pain and right flank pain. He has not had a bowel movement in 4 days. Pain rating is a 8/10 and per EMS abdomin is firm.     EMS vitals: 132/64 BP 80 HR 97% SPO2 on room air 18 RR

## 2024-05-12 NOTE — Progress Notes (Signed)
 Remote PPM Transmission

## 2024-05-12 NOTE — Progress Notes (Signed)
 Unable to order glycemic scale, Pt received rapid insulin  in ED

## 2024-05-13 DIAGNOSIS — N184 Chronic kidney disease, stage 4 (severe): Secondary | ICD-10-CM

## 2024-05-13 DIAGNOSIS — N179 Acute kidney failure, unspecified: Secondary | ICD-10-CM

## 2024-05-13 DIAGNOSIS — N132 Hydronephrosis with renal and ureteral calculous obstruction: Secondary | ICD-10-CM

## 2024-05-13 DIAGNOSIS — E875 Hyperkalemia: Secondary | ICD-10-CM

## 2024-05-13 LAB — CBC
HCT: 28.1 % — ABNORMAL LOW (ref 39.0–52.0)
Hemoglobin: 9.1 g/dL — ABNORMAL LOW (ref 13.0–17.0)
MCH: 29.9 pg (ref 26.0–34.0)
MCHC: 32.4 g/dL (ref 30.0–36.0)
MCV: 92.4 fL (ref 80.0–100.0)
Platelets: 77 K/uL — ABNORMAL LOW (ref 150–400)
RBC: 3.04 MIL/uL — ABNORMAL LOW (ref 4.22–5.81)
RDW: 14 % (ref 11.5–15.5)
WBC: 4.7 K/uL (ref 4.0–10.5)
nRBC: 0 % (ref 0.0–0.2)

## 2024-05-13 LAB — RENAL FUNCTION PANEL
Albumin: 3.1 g/dL — ABNORMAL LOW (ref 3.5–5.0)
Anion gap: 9 (ref 5–15)
BUN: 56 mg/dL — ABNORMAL HIGH (ref 8–23)
CO2: 21 mmol/L — ABNORMAL LOW (ref 22–32)
Calcium: 8.8 mg/dL — ABNORMAL LOW (ref 8.9–10.3)
Chloride: 114 mmol/L — ABNORMAL HIGH (ref 98–111)
Creatinine, Ser: 4.07 mg/dL — ABNORMAL HIGH (ref 0.61–1.24)
GFR, Estimated: 13 mL/min — ABNORMAL LOW
Glucose, Bld: 170 mg/dL — ABNORMAL HIGH (ref 70–99)
Phosphorus: 3.9 mg/dL (ref 2.5–4.6)
Potassium: 6.6 mmol/L (ref 3.5–5.1)
Sodium: 144 mmol/L (ref 135–145)

## 2024-05-13 LAB — MAGNESIUM: Magnesium: 2.2 mg/dL (ref 1.7–2.4)

## 2024-05-13 LAB — GLUCOSE, CAPILLARY
Glucose-Capillary: 123 mg/dL — ABNORMAL HIGH (ref 70–99)
Glucose-Capillary: 142 mg/dL — ABNORMAL HIGH (ref 70–99)
Glucose-Capillary: 226 mg/dL — ABNORMAL HIGH (ref 70–99)
Glucose-Capillary: 216 mg/dL — ABNORMAL HIGH (ref 70–99)

## 2024-05-13 LAB — HEMOGLOBIN A1C
Hgb A1c MFr Bld: 7.6 % — ABNORMAL HIGH (ref 4.8–5.6)
Mean Plasma Glucose: 171.42 mg/dL

## 2024-05-13 MED ORDER — CHLORHEXIDINE GLUCONATE CLOTH 2 % EX PADS: 6.0000 | MEDICATED_PAD | Freq: Every day | CUTANEOUS | Status: DC

## 2024-05-13 MED ORDER — CHLORHEXIDINE GLUCONATE CLOTH 2 % EX PADS
6.0000 | MEDICATED_PAD | Freq: Every day | CUTANEOUS | Status: DC
  Administered 2024-05-14 – 2024-05-16 (×3): 6 via TOPICAL

## 2024-05-13 MED ORDER — DEXTROSE 50 % IV SOLN
25.0000 g | Freq: Once | INTRAVENOUS | Status: AC
  Administered 2024-05-13: 25 g via INTRAVENOUS
  Filled 2024-05-13: qty 50

## 2024-05-13 MED ORDER — SODIUM CHLORIDE 0.9 % IV SOLN: INTRAVENOUS | Status: DC

## 2024-05-13 MED ORDER — CALCIUM GLUCONATE-NACL 1-0.675 GM/50ML-% IV SOLN
1.0000 g | INTRAVENOUS | Status: AC
  Administered 2024-05-13: 1000 mg via INTRAVENOUS
  Filled 2024-05-13: qty 50

## 2024-05-13 MED ORDER — SODIUM ZIRCONIUM CYCLOSILICATE 10 G PO PACK
10.0000 g | PACK | Freq: Three times a day (TID) | ORAL | Status: AC
  Administered 2024-05-13 (×3): 10 g via ORAL
  Filled 2024-05-13 (×3): qty 1

## 2024-05-13 MED ORDER — INSULIN ASPART 100 UNIT/ML IJ SOLN
0.0000 [IU] | Freq: Three times a day (TID) | INTRAMUSCULAR | Status: DC
  Administered 2024-05-13 (×2): 2 [IU] via SUBCUTANEOUS
  Administered 2024-05-14 (×2): 1 [IU] via SUBCUTANEOUS
  Administered 2024-05-14: 2 [IU] via SUBCUTANEOUS
  Administered 2024-05-15 – 2024-05-16 (×5): 1 [IU] via SUBCUTANEOUS
  Filled 2024-05-13 (×3): qty 1
  Filled 2024-05-13 (×2): qty 2
  Filled 2024-05-13: qty 1
  Filled 2024-05-13: qty 2

## 2024-05-13 MED ORDER — INSULIN ASPART 100 UNIT/ML IV SOLN
10.0000 [IU] | Freq: Once | INTRAVENOUS | Status: AC
  Administered 2024-05-13: 10 [IU] via INTRAVENOUS
  Filled 2024-05-13: qty 10

## 2024-05-13 MED ORDER — CALCIUM GLUCONATE-NACL 1-0.675 GM/50ML-% IV SOLN
1.0000 g | Freq: Once | INTRAVENOUS | Status: DC
  Filled 2024-05-13: qty 50

## 2024-05-13 NOTE — Assessment & Plan Note (Signed)
-   Not on anticoagulation - On Toprol  at home

## 2024-05-13 NOTE — Assessment & Plan Note (Signed)
-   d/c basal insulin  especially in setting of worsened renal failure - SSI only for now -A1c 7.6% on 05/13/2024

## 2024-05-13 NOTE — Assessment & Plan Note (Signed)
-   No signs/symptoms of exacerbation - Tolerated fluids -Torsemide  held until follow-up with primary care and repeat BMP prior to resumption

## 2024-05-13 NOTE — Anesthesia Postprocedure Evaluation (Signed)
"   Anesthesia Post Note  Patient: Dean Murphy  Procedure(s) Performed: CYSTOSCOPY, WITH STENT INSERTION, RETROGRADE (Right)     Patient location during evaluation: PACU Anesthesia Type: General Level of consciousness: awake Pain management: pain level controlled Vital Signs Assessment: post-procedure vital signs reviewed and stable Respiratory status: spontaneous breathing, nonlabored ventilation and respiratory function stable Cardiovascular status: blood pressure returned to baseline and stable Postop Assessment: no apparent nausea or vomiting Anesthetic complications: no   No notable events documented.               Delon Aisha Arch      "

## 2024-05-13 NOTE — Assessment & Plan Note (Signed)
-

## 2024-05-13 NOTE — Assessment & Plan Note (Signed)
 Continue Crestor

## 2024-05-13 NOTE — Assessment & Plan Note (Addendum)
-   patient has history of CKD4. Baseline creat ~ 2.3 - 2.4, eGFR~ 20-25 - patient presents with increase in creat >0.3 mg/dL above baseline or creat increase >1.5x baseline presumed to have occurred within past 7 days PTA - presumed BOO from ureteral calculus; was also still taking torsemide  at home PTA - Creatinine 4.34 on admission and hyperkalemia with 6.4 potassium - Has now undergone ureteral stent placement to relieve obstruction -Renal function has steadily improved.  Catheter removed prior to discharge -Repeat BMP at follow-up

## 2024-05-13 NOTE — Consult Note (Addendum)
 Urology Consult   Physician requesting consult: Dr. Patsy  Reason for consult: right ureteral stone with ARF and atrophic contralateral kidney  History of Present Illness: Dean Murphy is a 89 y.o. male with PMH GERD, HLD, HTN, afib, heart block with pacemaker in place, prior MI, and CKD who presented to [facility] ED with several days of worsening abdominal and right flank/groin pain. Pt reports he has been unable to have a bowel movement for 3-4 days. His right flank and groin pain is intermittent. No fevers. No emesis.   CT in the ED is notable for 5 mm right mid-ureteral stone with associated hydronephrosis. Notably, patient's left kidney appears atrophic.  Labs notable for Cr 4.34 from recent baseline 2.5 and K 6.4. No leukocytosis. UA is pending, but patient denies dysuria, frequency, or other signs of infection.  Pt reports having a ureteral stent placed some years ago; he thinks this was for a kidney stone.   Interval 12/20: Pt doing well POD 1 right ureteral stent placement. AFVSS. Foley remains in place for maximal decompression while renal function recovers; urine is pink. Cr 4.07 from 5.0 yesterday. K this am 6.6.  Past Medical History:  Diagnosis Date   (HFpEF) heart failure with preserved ejection fraction (HCC) 07/2014   a.) initially Dx'd in 07/2014 in setting of CHB; b.) TTE 09/02/2017: EF 55-60%, apical septal HK, G1DD, sev LAE, mild MR, PASP 41; c.) TTE 12/25/2022: EF 60-65%, mild LVH, G1DD, mild AoV sclerosis with no stenosis   Anemia due to chronic blood loss    Aortic atherosclerosis    Arthritis    AVM (arteriovenous malformation) of colon    a.)colonoscopy 08/10/2022: angioectasias (AVM) x 2 --> Tx'd with APC   Bilateral carotid artery disease    a.) s/p BILATERAL carotid endarterectomies: RIGHT (12/31/2010) and LEFT (02/09/2011); b.) carotid doppler 05/13/2023: 1-39% BICA with disturbed flow from the RIGHT subclavian.   Blindness of right eye    CAD (coronary artery  disease)    a.) s/p 5v CABG 1989; b.) MV 01/02/2011: EF 75%, no ischemia   Cerebral microvascular disease    CHB (complete heart block) (HCC) 07/2014   a.) s/p St. Jude Assurity dual chamber PPM 08/13/2014   Chronic BILATERAL L5 pars interarticularis defect    Chronic kidney disease, stage IV (severe) (HCC) 2015   Chronic prostatitis    Chronic radicular lumbar pain    Colon polyps    Complication of anesthesia    a.) PONV   Constipation    DDD (degenerative disc disease), thoracolumbar    Diverticulosis    DOE (dyspnea on exertion)    Dupuytren's disease of finger and palm of right hand    Essential hypertension    GERD (gastroesophageal reflux disease)    Gout    Hemorrhoids    Hiatal hernia    History of bilateral cataract extraction    HTN (hypertension)    Hyperlipidemia    Insomnia    Lacunar infarction (CVA) 01/01/2011   a,) noted on MRI brain 01/01/2011: remote lacunar infarcts of LEFT cerebellum and basal ganglia BILATERALLY   Myelolipoma of left adrenal gland    Myocardial infarction (HCC) 1976   a.) type/details unknown   NSTEMI (non-ST elevated myocardial infarction) (HCC) 08/11/2014   a.) troponins were trended: 0.35 --> 0.54 --> 0.54 ng/mL   Orthostasis    Osteoporosis    PAF (paroxysmal atrial fibrillation) (HCC) 07/2014   a.) CHA2DS2-VASc = 8 (age x2, sex, HFpEF, HTN, CVA  x2, vascular disease, T2DM) as of 07/01/2023; b.) cardiac rate/rhythm maintained on oral metoprolol  succinate; no OAC   PONV (postoperative nausea and vomiting)    Positive ANA (antinuclear antibody)    a.) workup with rheumatology (-) for SLE   Presence of permanent cardiac pacemaker 08/13/2014   a.) s/p St. Jude Assurity dual chamber PPM 08/13/2014 to LEFT infraclavicular deltopectoral region   PVD (peripheral vascular disease)    Recurrent falls    Right inguinal hernia    S/P CABG x 5 1989   Scoliosis    Symptomatic bradycardia    a.) s/p St. Jude Assurity dual chamber PPM  08/13/2014   Thoracic compression fracture (L1-L2)    a.) s/p L1 balloon kyphoplasty 08/13/2022   Thrombocytopenia    TMJ (dislocation of temporomandibular joint)    Type 2 diabetes mellitus treated with insulin  [facility])     Past Surgical History:  Procedure Laterality Date   CARDIAC CATHETERIZATION  08/11/2014   Procedure: TEMPORARY PACEMAKER;  Surgeon: Dorn JINNY Lesches, MD;  Location: [facility] CATH LAB;  Service: Cardiovascular;;   CAROTID ENDARTERECTOMY Right 12/31/2010   CAROTID ENDARTERECTOMY Left 02/09/2011   CATARACT EXTRACTION W/ INTRAOCULAR LENS IMPLANT Bilateral    CHOLECYSTECTOMY     COLONOSCOPY WITH PROPOFOL  N/A 08/10/2022   Procedure: COLONOSCOPY WITH PROPOFOL ;  Surgeon: Shila Gustav GAILS, MD;  Location: [facility] ENDOSCOPY;  Service: Gastroenterology;  Laterality: N/A;   CORONARY ARTERY BYPASS GRAFT  05/26/1987   x 5 vessels   ENDOSCOPIC RETROGRADE CHOLANGIOPANCREATOGRAPHY (ERCP) WITH PROPOFOL  N/A 01/27/2022   Procedure: ENDOSCOPIC RETROGRADE CHOLANGIOPANCREATOGRAPHY (ERCP) WITH PROPOFOL ;  Surgeon: Aneita Gwendlyn DASEN, MD;  Location: [facility] ENDOSCOPY;  Service: Gastroenterology;  Laterality: N/A;   ESOPHAGOGASTRODUODENOSCOPY N/A 08/09/2022   Procedure: ESOPHAGOGASTRODUODENOSCOPY (EGD);  Surgeon: Legrand Victory LITTIE DOUGLAS, MD;  Location: [facility] ENDOSCOPY;  Service: Gastroenterology;  Laterality: N/A;   HOT HEMOSTASIS N/A 08/10/2022   Procedure: HOT HEMOSTASIS (ARGON PLASMA COAGULATION/BICAP);  Surgeon: Nandigam, Kavitha V, MD;  Location: [facility] ENDOSCOPY;  Service: Gastroenterology;  Laterality: N/A;   I & D KNEE WITH POLY EXCHANGE Left 07/29/2015   Procedure: IRRIGATION AND DEBRIDEMENT LEFT KNEE WITH POLY EXCHANGE;  Surgeon: Redell Shoals, MD;  Location: [facility] OR;  Service: Orthopedics;  Laterality: Left;   INGUINAL HERNIA REPAIR Right 07/02/2023   Procedure: HERNIA REPAIR INGUINAL ADULT, open, RNFA to assist;  Surgeon: Jordis Laneta FALCON, MD;  Location: [facility] ORS;  Service: General;  Laterality: Right;   IR GENERIC  HISTORICAL  03/04/2016   IR US  GUIDE VASC ACCESS RIGHT 03/04/2016 Rome Hall, MD [facility]-INTERV RAD   IR GENERIC HISTORICAL  03/04/2016   IR FLUORO GUIDE CV LINE RIGHT 03/04/2016 Rome Hall, MD [facility]INTER[facility]   IR KYPHO LUMBAR INC FX REDUCE BONE BX UNI/BIL CANNULATION INC/IMAGING  08/13/2022   PERMANENT PACEMAKER INSERTION N/A 08/13/2014   STJ dual chamber pacemaker implanted by Dr Waddell for CHB   REMOVAL OF STONES  01/27/2022   Procedure: REMOVAL OF STONES;  Surgeon: Aneita Gwendlyn DASEN, MD;  Location: [facility];  Service: Gastroenterology;;   ANNETT  01/27/2022   Procedure: ANNETT;  Surgeon: Aneita Gwendlyn DASEN, MD;  Location: [facility] ENDOSCOPY;  Service: Gastroenterology;;   TONSILLECTOMY     TOTAL KNEE ARTHROPLASTY Bilateral     Current Hospital Medications:  Home Meds: Medications Ordered Prior to Encounter[1]   Scheduled Meds:  amLODipine   10 mg Oral Daily   amoxicillin   500 mg Oral BID   [START ON 05/15/2024] vitamin C   250 mg Oral Once per day  on Monday Wednesday Friday   cholecalciferol   1,000 Units Oral Daily   cyanocobalamin   1,000 mcg Oral Daily   dorzolamide -timolol   1 drop Both Eyes Q1500   [START ON 05/15/2024] ferrous sulfate   325 mg Oral Once per day on Monday Wednesday Friday   insulin  aspart  0-6 Units Subcutaneous TID AC & HS   ipratropium-albuterol   3 mL Nebulization Once   metoprolol  succinate  25 mg Oral QHS   multivitamin  1 tablet Oral Daily   pantoprazole   40 mg Oral BID   polyethylene glycol  17 g Oral Daily   rosuvastatin   20 mg Oral Daily   senna-docusate  2 tablet Oral QHS   sodium zirconium cyclosilicate   10 g Oral TID   Continuous Infusions:  sodium chloride  100 mL/hr at 05/13/24 0837   PRN Meds:.acetaminophen  **OR** acetaminophen , bisacodyl , ondansetron  (ZOFRAN ) IV **OR** ondansetron , oxyCODONE -acetaminophen   Allergies: Allergies[2]  Family History  Problem Relation Age of Onset   Heart disease Mother        Before age 75    Diabetes Mother    Varicose Veins Mother    Heart attack Mother    Heart attack Father    Heart disease Father        After age 21   Hypertension Father    Heart disease Brother        Before age 19   Hypertension Brother    Heart attack Brother    Diabetes Son    Hypertension Son    Hypertension Son    Diabetes Son    Diabetes Other    Cancer Other    Colon cancer Neg Hx     Social History:  reports that he quit smoking about 51 years ago. His smoking use included cigarettes. He started smoking about 76 years ago. He has a 25 pack-year smoking history. He has been exposed to tobacco smoke. He has never used smokeless tobacco. He reports current alcohol  use. He reports that he does not use drugs.  ROS: A complete review of systems was performed.  All systems are negative except for pertinent findings as noted.  Physical Exam:  Vital signs in last 24 hours: Temp:  [97.7 F (36.5 C)-98.2 F (36.8 C)] 97.9 F (36.6 C) (12/20 0902) Pulse Rate:  [51-80] 56 (12/20 0902) Resp:  [15-20] 18 (12/20 0902) BP: (119-164)/(51-93) 134/60 (12/20 0902) SpO2:  [94 %-99 %] 95 % (12/19 2045) Weight:  [82.5 kg] 82.5 kg (12/19 1819) Constitutional:  Alert and oriented, No acute distress. Comfortable appearing  Cardiovascular: Regular rate and rhythm, No JVD Respiratory: Normal respiratory effort, Lungs clear bilaterally GI: Abdomen is soft, nontender, nondistended, no abdominal masses GU: Foley in place; urine is pink Lymphatic: No lymphadenopathy Neurologic: Grossly intact, no focal deficits Psychiatric: Normal mood and affect  Laboratory Data:  Recent Labs    05/12/24 1558 05/12/24 1840 05/13/24 0536  WBC 8.2  --  4.7  HGB 10.9* 10.9* 9.1*  HCT 33.7* 32.0* 28.1*  PLT 92*  --  77*    Recent Labs    05/12/24 1558 05/12/24 1840 05/12/24 2204 05/13/24 0536  NA 142 141 140 144  K 6.4* 6.0* 5.8* 6.6*  CL 110 115* 112* 114*  GLUCOSE 175* 187* 148* 170*  BUN 61* 59* 57* 56*   CALCIUM  9.4  --  9.0 8.8*  CREATININE 4.34* 5.00* 4.10* 4.07*     Results for orders placed or performed during the hospital encounter of 05/12/24 (from the past  24 hours)  Comprehensive metabolic panel     Status: Abnormal   Collection Time: 05/12/24  3:58 PM  Result Value Ref Range   Sodium 142 135 - 145 mmol/L   Potassium 6.4 (HH) 3.5 - 5.1 mmol/L   Chloride 110 98 - 111 mmol/L   CO2 22 22 - 32 mmol/L   Glucose, Bld 175 (H) 70 - 99 mg/dL   BUN 61 (H) 8 - 23 mg/dL   Creatinine, Ser 5.65 (H) 0.61 - 1.24 mg/dL   Calcium  9.4 8.9 - 10.3 mg/dL   Total Protein 6.6 6.5 - 8.1 g/dL   Albumin 3.6 3.5 - 5.0 g/dL   AST 43 (H) 15 - 41 U/L   ALT 49 (H) 0 - 44 U/L   Alkaline Phosphatase 307 (H) 38 - 126 U/L   Total Bilirubin 0.4 0.0 - 1.2 mg/dL   GFR, Estimated 12 (L) >60 mL/min   Anion gap 10 5 - 15  Lipase, blood     Status: None   Collection Time: 05/12/24  3:58 PM  Result Value Ref Range   Lipase 25 11 - 51 U/L  CBC with Diff     Status: Abnormal   Collection Time: 05/12/24  3:58 PM  Result Value Ref Range   WBC 8.2 4.0 - 10.5 K/uL   RBC 3.64 (L) 4.22 - 5.81 MIL/uL   Hemoglobin 10.9 (L) 13.0 - 17.0 g/dL   HCT 66.2 (L) 60.9 - 47.9 %   MCV 92.6 80.0 - 100.0 fL   MCH 29.9 26.0 - 34.0 pg   MCHC 32.3 30.0 - 36.0 g/dL   RDW 86.1 88.4 - 84.4 %   Platelets 92 (L) 150 - 400 K/uL   nRBC 0.0 0.0 - 0.2 %   Neutrophils Relative % 76 %   Neutro Abs 6.2 1.7 - 7.7 K/uL   Lymphocytes Relative 14 %   Lymphs Abs 1.1 0.7 - 4.0 K/uL   Monocytes Relative 9 %   Monocytes Absolute 0.7 0.1 - 1.0 K/uL   Eosinophils Relative 1 %   Eosinophils Absolute 0.1 0.0 - 0.5 K/uL   Basophils Relative 0 %   Basophils Absolute 0.0 0.0 - 0.1 K/uL   WBC Morphology See Note    Smear Review Normal platelet morphology    Immature Granulocytes 0 %   Abs Immature Granulocytes 0.03 0.00 - 0.07 K/uL   Burr Cells PRESENT   Urinalysis, Routine w reflex microscopic -Urine, Clean Catch     Status: Abnormal    Collection Time: 05/12/24  5:40 PM  Result Value Ref Range   Color, Urine YELLOW YELLOW   APPearance CLEAR CLEAR   Specific Gravity, Urine 1.011 1.005 - 1.030   pH 5.0 5.0 - 8.0   Glucose, UA 50 (A) NEGATIVE mg/dL   Hgb urine dipstick SMALL (A) NEGATIVE   Bilirubin Urine NEGATIVE NEGATIVE   Ketones, ur NEGATIVE NEGATIVE mg/dL   Protein, ur 30 (A) NEGATIVE mg/dL   Nitrite NEGATIVE NEGATIVE   Leukocytes,Ua NEGATIVE NEGATIVE   RBC / HPF 6-10 0 - 5 RBC/hpf   WBC, UA 0-5 0 - 5 WBC/hpf   Bacteria, UA NONE SEEN NONE SEEN   Squamous Epithelial / HPF 0-5 0 - 5 /HPF   Hyaline Casts, UA PRESENT   Glucose, capillary     Status: Abnormal   Collection Time: 05/12/24  6:22 PM  Result Value Ref Range   Glucose-Capillary 192 (H) 70 - 99 mg/dL  I-STAT, chem 8  Status: Abnormal   Collection Time: 05/12/24  6:40 PM  Result Value Ref Range   Sodium 141 135 - 145 mmol/L   Potassium 6.0 (H) 3.5 - 5.1 mmol/L   Chloride 115 (H) 98 - 111 mmol/L   BUN 59 (H) 8 - 23 mg/dL   Creatinine, Ser 4.99 (H) 0.61 - 1.24 mg/dL   Glucose, Bld 812 (H) 70 - 99 mg/dL   Calcium , Ion 1.22 1.15 - 1.40 mmol/L   TCO2 18 (L) 22 - 32 mmol/L   Hemoglobin 10.9 (L) 13.0 - 17.0 g/dL   HCT 67.9 (L) 60.9 - 47.9 %  Glucose, capillary     Status: Abnormal   Collection Time: 05/12/24  8:05 PM  Result Value Ref Range   Glucose-Capillary 136 (H) 70 - 99 mg/dL  Basic metabolic panel     Status: Abnormal   Collection Time: 05/12/24 10:04 PM  Result Value Ref Range   Sodium 140 135 - 145 mmol/L   Potassium 5.8 (H) 3.5 - 5.1 mmol/L   Chloride 112 (H) 98 - 111 mmol/L   CO2 17 (L) 22 - 32 mmol/L   Glucose, Bld 148 (H) 70 - 99 mg/dL   BUN 57 (H) 8 - 23 mg/dL   Creatinine, Ser 5.89 (H) 0.61 - 1.24 mg/dL   Calcium  9.0 8.9 - 10.3 mg/dL   GFR, Estimated 13 (L) >60 mL/min   Anion gap 11 5 - 15  CBC     Status: Abnormal   Collection Time: 05/13/24  5:36 AM  Result Value Ref Range   WBC 4.7 4.0 - 10.5 K/uL   RBC 3.04 (L) 4.22 -  5.81 MIL/uL   Hemoglobin 9.1 (L) 13.0 - 17.0 g/dL   HCT 71.8 (L) 60.9 - 47.9 %   MCV 92.4 80.0 - 100.0 fL   MCH 29.9 26.0 - 34.0 pg   MCHC 32.4 30.0 - 36.0 g/dL   RDW 85.9 88.4 - 84.4 %   Platelets 77 (L) 150 - 400 K/uL   nRBC 0.0 0.0 - 0.2 %  Hemoglobin A1c     Status: Abnormal   Collection Time: 05/13/24  5:36 AM  Result Value Ref Range   Hgb A1c MFr Bld 7.6 (H) 4.8 - 5.6 %   Mean Plasma Glucose 171.42 mg/dL  Renal function panel     Status: Abnormal   Collection Time: 05/13/24  5:36 AM  Result Value Ref Range   Sodium 144 135 - 145 mmol/L   Potassium 6.6 (HH) 3.5 - 5.1 mmol/L   Chloride 114 (H) 98 - 111 mmol/L   CO2 21 (L) 22 - 32 mmol/L   Glucose, Bld 170 (H) 70 - 99 mg/dL   BUN 56 (H) 8 - 23 mg/dL   Creatinine, Ser 5.92 (H) 0.61 - 1.24 mg/dL   Calcium  8.8 (L) 8.9 - 10.3 mg/dL   Phosphorus 3.9 2.5 - 4.6 mg/dL   Albumin 3.1 (L) 3.5 - 5.0 g/dL   GFR, Estimated 13 (L) >60 mL/min   Anion gap 9 5 - 15  Magnesium      Status: None   Collection Time: 05/13/24  5:36 AM  Result Value Ref Range   Magnesium  2.2 1.7 - 2.4 mg/dL  Glucose, capillary     Status: Abnormal   Collection Time: 05/13/24  8:02 AM  Result Value Ref Range   Glucose-Capillary 123 (H) 70 - 99 mg/dL   No results found for this or any previous visit (from the past 240 hours).  Renal Function: Recent Labs    05/12/24 1558 05/12/24 1840 05/12/24 2204 05/13/24 0536  CREATININE 4.34* 5.00* 4.10* 4.07*   Estimated Creatinine Clearance: 11.6 mL/min (A) (by C-G formula based on SCr of 4.07 mg/dL (H)).  Radiologic Imaging: DG C-Arm 1-60 Min-No Report Result Date: 05/12/2024 Fluoroscopy was utilized by the requesting physician.  No radiographic interpretation.   CT ABDOMEN PELVIS WO CONTRAST Result Date: 05/12/2024 EXAM: CT ABDOMEN AND PELVIS WITHOUT CONTRAST 05/12/2024 03:37:34 PM TECHNIQUE: CT of the abdomen and pelvis was performed without the administration of intravenous contrast. Multiplanar  reformatted images are provided for review. Automated exposure control, iterative reconstruction, and/or weight-based adjustment of the mA/kV was utilized to reduce the radiation dose to as low as reasonably achievable. COMPARISON: 04/25/2020 and ultrasound from 01/19/2022. CLINICAL HISTORY: Constipation, right lower quadrant abdominal pain and right flank pain. FINDINGS: LOWER CHEST: Nonspecific peripheral interstitial accentuation at the lung bases. Mild aortic and mitral valve calcification. Pacer leads noted. Prior median sternotomy. LIVER: Slightly lobulated contour of the liver, cirrhosis not excluded. GALLBLADDER AND BILE DUCTS: Cholecystectomy. No biliary ductal dilatation. SPLEEN: No acute abnormality. PANCREAS: No acute abnormality. ADRENAL GLANDS: 1.4 x 2.1 cm left adrenal myelolipoma on image 34 series 2. KIDNEYS, URETERS AND BLADDER: Mild to moderate right hydronephrosis and hydroureter extending down to a 5 mm in length mediastinal calculus just below the level of the iliac crests as shown on image 79 series 7. The right ureter distal to this point is of normal caliber. 3 mm in length right kidney lower pole nonobstructive renal calculus. No stones in the left kidney or left ureter. No left hydronephrosis or hydroureter. No perinephric or periureteral stranding. Urinary bladder is unremarkable. GI AND BOWEL: Stomach demonstrates no acute abnormality. Sigmoid colon diverticulosis. Overall stool burden is within normal limits. There is no bowel obstruction. PERITONEUM AND RETROPERITONEUM: No ascites. No free air. VASCULATURE: Aortic and coronary atherosclerosis. Atherosclerosis of the abdominal aorta and its branches including the celiac trunk and proximal superior mesenteric artery demonstrate a substantial atheromatous plaque. Patency not assessed on today's noncontrast examination. LYMPH NODES: No lymphadenopathy. REPRODUCTIVE ORGANS: Fatty left spermatic cord. BONES AND SOFT TISSUES: Right  posterolateral 11th rib nonunited fracture. Old healed bilateral lower rib fractures. Prior L1 vertebral augmentation. Chronic bilateral pars defects at L5 with grade 1 anterolisthesis of L5 on S1 and a resulting right foraminal impingement at L5-S1. Prior right inguinal hernia repair with some mild prominence of fatty tissues in the vicinity. No focal soft tissue abnormality. IMPRESSION: 1. Mild to moderate right hydronephrosis and hydroureter secondary to a 5 mm right ureteral calculus just below the level of the iliac crests, with normal caliber of the distal right ureter. 2. Nonobstructive 3 mm right lower pole renal calculus. 3. Slightly lobulated hepatic contour, cirrhosis not excluded. 4. Sigmoid colon diverticulosis without evidence of diverticulitis. 5. Chronic bilateral pars defects at L5 with grade 1 anterolisthesis of L5 on S1 and resulting right foraminal impingement at L5-S1, with prior L1 vertebral augmentation. 6. Additional chronic and incidental findings include aortic and coronary atherosclerosis with atherosclerosis of the abdominal aorta and its branches, mild aortic and mitral valve calcification, pacer leads, nonspecific peripheral interstitial accentuation at the lung bases, cholecystectomy, prior right inguinal hernia repair with mild fat prominence in the region and fatty left spermatic cord, prior median sternotomy, chronic nonunited right posterolateral 11th rib fracture, and old healed bilateral lower rib fractures. Electronically signed by: Ryan Salvage MD 05/12/2024 04:01 PM EST RP Workstation: [id number]  I independently reviewed the above imaging studies.  Impression/Recommendation Pt is a 89 yo male with PMH as above who presented to [facility] ED with right flank and groin pain and was found to have an obstructing 5 mm right ureteral stone. Labs concerning for acute on chronic renal failure with hyperkalemia.  Patient doing well POD1 right stent placement. Cr improving to  4.07 from peak 5.0, though remains hyperkalemic to 6.6.  Recommendations: - Appreciate Medicine service's management of ARI and electrolyte abnormalities - Continue foley catheter until renal function nadirs - Urology will arrange outpatient follow-up to discuss definitive stone treatment - Urology will follow peripherally. Please page with new concerns or questions   Maurilio Agar 05/13/2024, 10:18 AM   I have seen and examined the patient and agree with the above assessment and plan.  Stented yesterday with stable creatinine. Continue foley until adequate downtrend of creatinine.  Will arrange outpatient followup for definitive treatment of stone.  Matt R. Irfan Veal MD [facility]  Pager: [phone]         [1]  No current facility-administered medications on file prior to encounter.   Current Outpatient Medications on File Prior to Encounter  Medication Sig Dispense Refill   amLODipine  (NORVASC ) 10 MG tablet Take 10 mg by mouth daily.     amoxicillin  (AMOXIL ) 500 MG capsule Take 1 capsule (500 mg total) by mouth 2 (two) times daily. 180 capsule 1   cholecalciferol  (VITAMIN D3) 25 MCG (1000 UNIT) tablet Take 1,000 Units by mouth daily.     cyanocobalamin  (VITAMIN B12) 1000 MCG tablet Take 1 tablet (1,000 mcg total) by mouth daily.     dorzolamide -timolol  (COSOPT ) 22.3-6.8 MG/ML ophthalmic solution Place 1 drop into both eyes daily in the afternoon. In both eye     insulin  glargine (LANTUS  SOLOSTAR) 100 UNIT/ML Solostar Pen INJECT 46 UNITS INTO THE SKIN DAILY. (Patient taking differently: 38 Units at bedtime.) 45 mL 2   Iron , Ferrous Sulfate , 325 (65 Fe) MG TABS Take 325 mg by mouth 3 (three) times a week.     metoprolol  succinate (TOPROL  XL) 25 MG 24 hr tablet Take 1 tablet (25 mg total) by mouth at bedtime. 90 tablet 3   Multiple Vitamins-Minerals (PRESERVISION AREDS 2) CAPS Take 1 capsule by mouth daily.     pantoprazole  (PROTONIX ) 40 MG tablet TAKE 1 TABLET BY MOUTH TWICE A  DAY 180 tablet 3   rosuvastatin  (CRESTOR ) 20 MG tablet Take 1 tablet (20 mg total) by mouth daily. 90 tablet 3   torsemide  (DEMADEX ) 10 MG tablet Take 1 tablet (10 mg total) by mouth 2 (two) times a week. Monday and Friday (Patient taking differently: Take 10 mg by mouth 3 (three) times a week. Monday Wed and Friday)     vitamin C  (ASCORBIC ACID ) 250 MG tablet Take 250 mg by mouth 3 (three) times a week. M-W-F     B-D UF III MINI PEN NEEDLES 31G X 5 MM MISC USE DAILY WITH INSULIN  PEN 100 each 4   clotrimazole -betamethasone  (LOTRISONE ) cream APPLY TOPICALLY 2 TIMES DAILY AS NEEDED. 30 g 1  [2]  Allergies Allergen Reactions   Zoledronic  Acid Other (See Comments)    Discontinued in 2015 due to progressive decrease in renal function (CKD-III/IV)   Lokelma  [Sodium Zirconium Cyclosilicate ]     itching   Nsaids Other (See Comments)    Held due to creatinine elevation.

## 2024-05-13 NOTE — Assessment & Plan Note (Signed)
-   Insetting of worsened renal function -Treated with calcium  gluconate and insulin  on admission but no treatment with medications to help excrete -Still elevated this morning; discussed his allergy of Kayexalate .  Daughter-in-law at bedside states typically has caused diarrhea in the past which is not unexpected.  Reaction in epic is noted to be itching.  Regardless, he is amenable with treatment at this time -Kayexalate  ordered for today along with repeat doses of calcium  gluconate and insulin /D50 -Unable to give diuresis in setting of worsened renal function -Repeat BMP this afternoon

## 2024-05-13 NOTE — Plan of Care (Signed)
   Problem: Education: Goal: Knowledge of General Education information will improve Description: Including pain rating scale, medication(s)/side effects and non-pharmacologic comfort measures Outcome: Progressing   Problem: Clinical Measurements: Goal: Will remain free from infection Outcome: Progressing

## 2024-05-13 NOTE — Assessment & Plan Note (Signed)
-   Due to anemia of chronic disease - Baseline hemoglobin around 9 to 11 g/dL.  Currently at baseline

## 2024-05-13 NOTE — Progress Notes (Signed)
 Date and time results received: 05/13/2024 0705 (use smartphrase .now to insert current time)  Test: CBG Critical Value: K+ 6.6  Name of Provider Notified: in the middle of shift change and report Day shift RN notified as well.   Orders Received? Or Actions Taken?: Dayshift to to [employer]

## 2024-05-13 NOTE — Progress Notes (Addendum)
 Patient, Son and daughter in law states no clear allergy to medication Lokelma .  Stated medication causes upset stomach, not hives and itching.  Allergy removed from profile per request. Medication administered per MD orders, for treatment of Hyperkalemia.  Plan of care updated, verbalized understanding  @ 1700 per patients daughter in law.  Medication allergy clarification. Stated Lokelma  causes itching and hives when consumed in large quantities.  Nursing to monitor closely for reactions.  Patient remains asymptomatic at this time

## 2024-05-13 NOTE — Assessment & Plan Note (Signed)
-   Continue amlodipine and Toprol ?

## 2024-05-13 NOTE — Hospital Course (Signed)
 Mr. Sieling is a 89 yo male with PMH CKD4, HFpEF, CHB s/p PPM, PAF not on DOAC, constipation, CVA, CAD s/p CABG, DM II, chronic thrombocytopenia, anemia, PVD, osteoporosis, DDD who presented with right flank pain and constipation.  Symptoms approximately 1 week with constipation approximately 3 days prior to admission.  Denied having any fevers, chills, nausea, vomiting. Workup in the ER consisted of CT A/P which showed mild to moderate right hydronephrosis and hydroureter due to 5 mm right ureteral calculus.  Nonobstructive 3 mm right lower pole renal calculus also noted. He was afebrile with otherwise normal vitals on workup as well. WBC 8.2. Renal function showed worsening from baseline, creatinine 4.34 with BUN 61 and potassium 6.4. He underwent cystoscopy with urology and right ureteral stent placement. He was admitted for further monitoring especially for renal function and hyperkalemia.

## 2024-05-13 NOTE — Assessment & Plan Note (Signed)
-   Chronic and no signs of bleeding at this time - Baseline around 90-100 K

## 2024-05-13 NOTE — Plan of Care (Signed)

## 2024-05-13 NOTE — Assessment & Plan Note (Signed)
-   On statin and Toprol  at home

## 2024-05-13 NOTE — Assessment & Plan Note (Signed)
-   Pacemaker in place - Continue Toprol

## 2024-05-13 NOTE — Assessment & Plan Note (Signed)
 Noted

## 2024-05-13 NOTE — Progress Notes (Signed)
 " Progress Note    Dean Murphy   FMW:[id number]  DOB: 04/21/1927  DOA: 05/12/2024     1 PCP: Cleatus Arlyss RAMAN, MD  Initial CC: right flank pain, constipation   Hospital Course: Dean Murphy is a 89 yo male with PMH CKD4, HFpEF, CHB s/p PPM, PAF not on DOAC, constipation, CVA, CAD s/p CABG, DM II, chronic thrombocytopenia, anemia, PVD, osteoporosis, DDD who presented with right flank pain and constipation.  Symptoms approximately 1 week with constipation approximately 3 days prior to admission.  Denied having any fevers, chills, nausea, vomiting. Workup in the ER consisted of CT A/P which showed mild to moderate right hydronephrosis and hydroureter due to 5 mm right ureteral calculus.  Nonobstructive 3 mm right lower pole renal calculus also noted. He was afebrile with otherwise normal vitals on workup as well. WBC 8.2. Renal function showed worsening from baseline, creatinine 4.34 with BUN 61 and potassium 6.4. He underwent cystoscopy with urology and right ureteral stent placement. He was admitted for further monitoring especially for renal function and hyperkalemia.  Interval History:  Daughter-in-law present bedside this morning.  Patient resting comfortably in bed with no complaints. Foley in place. Discussed lab results this morning and need for using Lokelma .  We reviewed his intolerance in the past and he is amenable for trying at this time.  Assessment and Plan: * Hydronephrosis with urinary obstruction due to ureteral calculus - per CT: mild to moderate right hydronephrosis and hydroureter due to 5 mm right ureteral calculus.  Nonobstructive 3 mm right lower pole renal calculus also noted. - s/p cystoscopy with right ureteral stent placement with urology on 05/12/2024 -No signs of sepsis on admission.  UA negative for signs of infection as well on admission - Empirically on amoxicillin  post procedure -Further management of stent per urology outpatient - Continue Foley as per  urology as well  Acute kidney injury superimposed on stage 4 chronic kidney disease (HCC) - patient has history of CKD4. Baseline creat ~ 2.3 - 2.4, eGFR~ 20-25 - patient presents with increase in creat >0.3 mg/dL above baseline or creat increase >1.5x baseline presumed to have occurred within past 7 days PTA - presumed BOO from ureteral calculus; was also still taking torsemide  at home PTA - Creatinine 4.34 on admission and hyperkalemia with 6.4 potassium - Has now undergone ureteral stent placement to relieve obstruction - Continue fluids and trend renal function   Hyperkalemia - Insetting of worsened renal function -Treated with calcium  gluconate and insulin  on admission but no treatment with medications to help excrete -Still elevated this morning; discussed his allergy of Kayexalate .  Daughter-in-law at bedside states typically has caused diarrhea in the past which is not unexpected.  Reaction in epic is noted to be itching.  Regardless, he is amenable with treatment at this time -Kayexalate  ordered for today along with repeat doses of calcium  gluconate and insulin /D50 -Unable to give diuresis in setting of worsened renal function -Repeat BMP this afternoon  Thrombocytopenia - Chronic and no signs of bleeding at this time - Baseline around 90-100 K  Hyperlipidemia associated with type 2 diabetes mellitus (HCC) - Continue Crestor   Chronic diastolic heart failure (HCC) - No signs/symptoms of exacerbation - Currently on fluids, will watch for tolerance -Torsemide  on hold  Coronary artery disease - On statin and Toprol  at home  History of CVA (cerebrovascular accident) - Continue Crestor   Status post placement of cardiac pacemaker - Noted  Normocytic anemia - Due to anemia of chronic  disease - Baseline hemoglobin around 9 to 11 g/dL.  Currently at baseline  Paroxysmal A-fib (HCC) - Not on anticoagulation - On Toprol  at home  Complete heart block (HCC) - Pacemaker in  place - Continue Toprol   GERD - Continue Protonix   Essential hypertension - Continue amlodipine  and Toprol   DM (diabetes mellitus), type 2 with renal complications (HCC) - d/c basal insulin  especially in setting of worsened renal failure - SSI only for now -A1c 7.6% on 05/13/2024   Antimicrobials: Amoxicillin  05/13/2024 >> current  DVT prophylaxis:  Place TED hose Start: 05/12/24 1803   Code Status:   Code Status: Full Code  Mobility Assessment (Last 72 Hours)     Mobility Assessment     Row Name 05/12/24 2130           Does the patient have exclusion criteria? No- Perform mobility assessment       What is the highest level of mobility based on the mobility assessment? Level 4 (Ambulates with assistance) - Balance while stepping forward/back - Complete          Diet: Diet Orders (From admission, onward)     Start     Ordered   05/12/24 2131  Diet Carb Modified Room service appropriate? Yes  Diet effective now       Question Answer Comment  Diet-HS Snack? Nothing   Calorie Level Medium 1600-2000   Fluid consistency: Thin   Room service appropriate? Yes      05/12/24 2130            Barriers to discharge: None Disposition Plan: Home HH orders placed: N/A Status is: Inpatient  Objective: Blood pressure (!) 146/53, pulse 60, temperature 98.3 F (36.8 C), resp. rate 18, height 5' 11 (1.803 m), weight 82.5 kg, SpO2 95%.  Examination:  Physical Exam Constitutional:      General: He is not in acute distress.    Appearance: Normal appearance.  HENT:     Head: Normocephalic and atraumatic.     Mouth/Throat:     Mouth: Mucous membranes are moist.  Eyes:     Extraocular Movements: Extraocular movements intact.  Cardiovascular:     Rate and Rhythm: Normal rate and regular rhythm.  Pulmonary:     Effort: Pulmonary effort is normal. No respiratory distress.     Breath sounds: Normal breath sounds. No wheezing.  Abdominal:     General: Bowel sounds  are normal. There is no distension.     Palpations: Abdomen is soft.     Tenderness: There is no abdominal tenderness.  Genitourinary:    Comments: Foley in place with pink/red-tinged urine in bag Musculoskeletal:        General: Normal range of motion.     Cervical back: Normal range of motion and neck supple.  Skin:    General: Skin is warm and dry.  Neurological:     General: No focal deficit present.     Mental Status: He is alert.  Psychiatric:        Mood and Affect: Mood normal.        Behavior: Behavior normal.      Consultants:  Urology  Procedures:  05/12/2024: Cystoscopy with right ureteral stent placement  Data Reviewed: Results for orders placed or performed during the hospital encounter of 05/12/24 (from the past 24 hours)  Comprehensive metabolic panel     Status: Abnormal   Collection Time: 05/12/24  3:58 PM  Result Value Ref Range   Sodium 142  135 - 145 mmol/L   Potassium 6.4 (HH) 3.5 - 5.1 mmol/L   Chloride 110 98 - 111 mmol/L   CO2 22 22 - 32 mmol/L   Glucose, Bld 175 (H) 70 - 99 mg/dL   BUN 61 (H) 8 - 23 mg/dL   Creatinine, Ser 5.65 (H) 0.61 - 1.24 mg/dL   Calcium  9.4 8.9 - 10.3 mg/dL   Total Protein 6.6 6.5 - 8.1 g/dL   Albumin 3.6 3.5 - 5.0 g/dL   AST 43 (H) 15 - 41 U/L   ALT 49 (H) 0 - 44 U/L   Alkaline Phosphatase 307 (H) 38 - 126 U/L   Total Bilirubin 0.4 0.0 - 1.2 mg/dL   GFR, Estimated 12 (L) >60 mL/min   Anion gap 10 5 - 15  Lipase, blood     Status: None   Collection Time: 05/12/24  3:58 PM  Result Value Ref Range   Lipase 25 11 - 51 U/L  CBC with Diff     Status: Abnormal   Collection Time: 05/12/24  3:58 PM  Result Value Ref Range   WBC 8.2 4.0 - 10.5 K/uL   RBC 3.64 (L) 4.22 - 5.81 MIL/uL   Hemoglobin 10.9 (L) 13.0 - 17.0 g/dL   HCT 66.2 (L) 60.9 - 47.9 %   MCV 92.6 80.0 - 100.0 fL   MCH 29.9 26.0 - 34.0 pg   MCHC 32.3 30.0 - 36.0 g/dL   RDW 86.1 88.4 - 84.4 %   Platelets 92 (L) 150 - 400 K/uL   nRBC 0.0 0.0 - 0.2 %    Neutrophils Relative % 76 %   Neutro Abs 6.2 1.7 - 7.7 K/uL   Lymphocytes Relative 14 %   Lymphs Abs 1.1 0.7 - 4.0 K/uL   Monocytes Relative 9 %   Monocytes Absolute 0.7 0.1 - 1.0 K/uL   Eosinophils Relative 1 %   Eosinophils Absolute 0.1 0.0 - 0.5 K/uL   Basophils Relative 0 %   Basophils Absolute 0.0 0.0 - 0.1 K/uL   WBC Morphology See Note    Smear Review Normal platelet morphology    Immature Granulocytes 0 %   Abs Immature Granulocytes 0.03 0.00 - 0.07 K/uL   Burr Cells PRESENT   Urinalysis, Routine w reflex microscopic -Urine, Clean Catch     Status: Abnormal   Collection Time: 05/12/24  5:40 PM  Result Value Ref Range   Color, Urine YELLOW YELLOW   APPearance CLEAR CLEAR   Specific Gravity, Urine 1.011 1.005 - 1.030   pH 5.0 5.0 - 8.0   Glucose, UA 50 (A) NEGATIVE mg/dL   Hgb urine dipstick SMALL (A) NEGATIVE   Bilirubin Urine NEGATIVE NEGATIVE   Ketones, ur NEGATIVE NEGATIVE mg/dL   Protein, ur 30 (A) NEGATIVE mg/dL   Nitrite NEGATIVE NEGATIVE   Leukocytes,Ua NEGATIVE NEGATIVE   RBC / HPF 6-10 0 - 5 RBC/hpf   WBC, UA 0-5 0 - 5 WBC/hpf   Bacteria, UA NONE SEEN NONE SEEN   Squamous Epithelial / HPF 0-5 0 - 5 /HPF   Hyaline Casts, UA PRESENT   Glucose, capillary     Status: Abnormal   Collection Time: 05/12/24  6:22 PM  Result Value Ref Range   Glucose-Capillary 192 (H) 70 - 99 mg/dL  I-STAT, chem 8     Status: Abnormal   Collection Time: 05/12/24  6:40 PM  Result Value Ref Range   Sodium 141 135 - 145 mmol/L   Potassium 6.0 (  H) 3.5 - 5.1 mmol/L   Chloride 115 (H) 98 - 111 mmol/L   BUN 59 (H) 8 - 23 mg/dL   Creatinine, Ser 4.99 (H) 0.61 - 1.24 mg/dL   Glucose, Bld 812 (H) 70 - 99 mg/dL   Calcium , Ion 1.22 1.15 - 1.40 mmol/L   TCO2 18 (L) 22 - 32 mmol/L   Hemoglobin 10.9 (L) 13.0 - 17.0 g/dL   HCT 67.9 (L) 60.9 - 47.9 %  Glucose, capillary     Status: Abnormal   Collection Time: 05/12/24  8:05 PM  Result Value Ref Range   Glucose-Capillary 136 (H) 70 - 99  mg/dL  Basic metabolic panel     Status: Abnormal   Collection Time: 05/12/24 10:04 PM  Result Value Ref Range   Sodium 140 135 - 145 mmol/L   Potassium 5.8 (H) 3.5 - 5.1 mmol/L   Chloride 112 (H) 98 - 111 mmol/L   CO2 17 (L) 22 - 32 mmol/L   Glucose, Bld 148 (H) 70 - 99 mg/dL   BUN 57 (H) 8 - 23 mg/dL   Creatinine, Ser 5.89 (H) 0.61 - 1.24 mg/dL   Calcium  9.0 8.9 - 10.3 mg/dL   GFR, Estimated 13 (L) >60 mL/min   Anion gap 11 5 - 15  CBC     Status: Abnormal   Collection Time: 05/13/24  5:36 AM  Result Value Ref Range   WBC 4.7 4.0 - 10.5 K/uL   RBC 3.04 (L) 4.22 - 5.81 MIL/uL   Hemoglobin 9.1 (L) 13.0 - 17.0 g/dL   HCT 71.8 (L) 60.9 - 47.9 %   MCV 92.4 80.0 - 100.0 fL   MCH 29.9 26.0 - 34.0 pg   MCHC 32.4 30.0 - 36.0 g/dL   RDW 85.9 88.4 - 84.4 %   Platelets 77 (L) 150 - 400 K/uL   nRBC 0.0 0.0 - 0.2 %  Hemoglobin A1c     Status: Abnormal   Collection Time: 05/13/24  5:36 AM  Result Value Ref Range   Hgb A1c MFr Bld 7.6 (H) 4.8 - 5.6 %   Mean Plasma Glucose 171.42 mg/dL  Renal function panel     Status: Abnormal   Collection Time: 05/13/24  5:36 AM  Result Value Ref Range   Sodium 144 135 - 145 mmol/L   Potassium 6.6 (HH) 3.5 - 5.1 mmol/L   Chloride 114 (H) 98 - 111 mmol/L   CO2 21 (L) 22 - 32 mmol/L   Glucose, Bld 170 (H) 70 - 99 mg/dL   BUN 56 (H) 8 - 23 mg/dL   Creatinine, Ser 5.92 (H) 0.61 - 1.24 mg/dL   Calcium  8.8 (L) 8.9 - 10.3 mg/dL   Phosphorus 3.9 2.5 - 4.6 mg/dL   Albumin 3.1 (L) 3.5 - 5.0 g/dL   GFR, Estimated 13 (L) >60 mL/min   Anion gap 9 5 - 15  Magnesium      Status: None   Collection Time: 05/13/24  5:36 AM  Result Value Ref Range   Magnesium  2.2 1.7 - 2.4 mg/dL  Glucose, capillary     Status: Abnormal   Collection Time: 05/13/24  8:02 AM  Result Value Ref Range   Glucose-Capillary 123 (H) 70 - 99 mg/dL  Glucose, capillary     Status: Abnormal   Collection Time: 05/13/24 11:26 AM  Result Value Ref Range   Glucose-Capillary 142 (H) 70 - 99  mg/dL    I have reviewed pertinent nursing notes, vitals, labs, and images  as necessary. I have ordered labwork to follow up on as indicated.  I have reviewed the last notes from staff over past 24 hours. I have discussed patient's care plan and test results with nursing staff, CM/SW, and other staff as appropriate.  Old records reviewed in assessment of this patient  Time spent: Greater than 50% of the 55 minute visit was spent in counseling/coordination of care for the patient as laid out in the A&P.   LOS: 1 day   Alm Apo, MD [facility] 05/13/2024, 12:13 PM "

## 2024-05-13 NOTE — Assessment & Plan Note (Addendum)
-   per CT: mild to moderate right hydronephrosis and hydroureter due to 5 mm right ureteral calculus.  Nonobstructive 3 mm right lower pole renal calculus also noted. - s/p cystoscopy with right ureteral stent placement with urology on 05/12/2024 -No signs of sepsis on admission.  UA negative for signs of infection as well on admission - Empirically on amoxicillin  post procedure -Further management of stent per urology outpatient - Foley 05/16/2024 prior to discharge given improvement in renal function.  Also discussed with urology prior to removal

## 2024-05-14 ENCOUNTER — Encounter (HOSPITAL_COMMUNITY): Payer: Self-pay | Admitting: Urology

## 2024-05-14 DIAGNOSIS — N132 Hydronephrosis with renal and ureteral calculous obstruction: Secondary | ICD-10-CM | POA: Diagnosis not present

## 2024-05-14 DIAGNOSIS — N179 Acute kidney failure, unspecified: Secondary | ICD-10-CM | POA: Diagnosis not present

## 2024-05-14 DIAGNOSIS — N184 Chronic kidney disease, stage 4 (severe): Secondary | ICD-10-CM | POA: Diagnosis not present

## 2024-05-14 DIAGNOSIS — E875 Hyperkalemia: Secondary | ICD-10-CM | POA: Diagnosis not present

## 2024-05-14 LAB — CBC WITH DIFFERENTIAL/PLATELET
Abs Immature Granulocytes: 0.01 K/uL (ref 0.00–0.07)
Basophils Absolute: 0 K/uL (ref 0.0–0.1)
Basophils Relative: 1 %
Eosinophils Absolute: 0.1 K/uL (ref 0.0–0.5)
Eosinophils Relative: 3 %
HCT: 28.4 % — ABNORMAL LOW (ref 39.0–52.0)
Hemoglobin: 9.1 g/dL — ABNORMAL LOW (ref 13.0–17.0)
Immature Granulocytes: 0 %
Lymphocytes Relative: 28 %
Lymphs Abs: 1 K/uL (ref 0.7–4.0)
MCH: 29.8 pg (ref 26.0–34.0)
MCHC: 32 g/dL (ref 30.0–36.0)
MCV: 93.1 fL (ref 80.0–100.0)
Monocytes Absolute: 0.2 K/uL (ref 0.1–1.0)
Monocytes Relative: 7 %
Neutro Abs: 2.2 K/uL (ref 1.7–7.7)
Neutrophils Relative %: 61 %
Platelets: 78 K/uL — ABNORMAL LOW (ref 150–400)
RBC: 3.05 MIL/uL — ABNORMAL LOW (ref 4.22–5.81)
RDW: 13.6 % (ref 11.5–15.5)
Smear Review: NORMAL
WBC: 3.6 K/uL — ABNORMAL LOW (ref 4.0–10.5)
nRBC: 0 % (ref 0.0–0.2)

## 2024-05-14 LAB — BASIC METABOLIC PANEL WITH GFR
Anion gap: 8 (ref 5–15)
Anion gap: 11 (ref 5–15)
BUN: 52 mg/dL — ABNORMAL HIGH (ref 8–23)
BUN: 50 mg/dL — ABNORMAL HIGH (ref 8–23)
CO2: 17 mmol/L — ABNORMAL LOW (ref 22–32)
CO2: 16 mmol/L — ABNORMAL LOW (ref 22–32)
Calcium: 8.4 mg/dL — ABNORMAL LOW (ref 8.9–10.3)
Calcium: 8.6 mg/dL — ABNORMAL LOW (ref 8.9–10.3)
Chloride: 117 mmol/L — ABNORMAL HIGH (ref 98–111)
Chloride: 113 mmol/L — ABNORMAL HIGH (ref 98–111)
Creatinine, Ser: 3.27 mg/dL — ABNORMAL HIGH (ref 0.61–1.24)
Creatinine, Ser: 3.47 mg/dL — ABNORMAL HIGH (ref 0.61–1.24)
GFR, Estimated: 17 mL/min — ABNORMAL LOW
GFR, Estimated: 16 mL/min — ABNORMAL LOW
Glucose, Bld: 157 mg/dL — ABNORMAL HIGH (ref 70–99)
Glucose, Bld: 214 mg/dL — ABNORMAL HIGH (ref 70–99)
Potassium: 5.4 mmol/L — ABNORMAL HIGH (ref 3.5–5.1)
Potassium: 5.6 mmol/L — ABNORMAL HIGH (ref 3.5–5.1)
Sodium: 142 mmol/L (ref 135–145)
Sodium: 140 mmol/L (ref 135–145)

## 2024-05-14 LAB — GLUCOSE, CAPILLARY
Glucose-Capillary: 149 mg/dL — ABNORMAL HIGH (ref 70–99)
Glucose-Capillary: 187 mg/dL — ABNORMAL HIGH (ref 70–99)
Glucose-Capillary: 209 mg/dL — ABNORMAL HIGH (ref 70–99)
Glucose-Capillary: 166 mg/dL — ABNORMAL HIGH (ref 70–99)

## 2024-05-14 LAB — MAGNESIUM: Magnesium: 2.2 mg/dL (ref 1.7–2.4)

## 2024-05-14 MED ORDER — SODIUM BICARBONATE 650 MG PO TABS
1300.0000 mg | ORAL_TABLET | Freq: Two times a day (BID) | ORAL | Status: AC
  Administered 2024-05-14 – 2024-05-15 (×4): 1300 mg via ORAL
  Filled 2024-05-14 (×4): qty 2

## 2024-05-14 MED ORDER — SODIUM ZIRCONIUM CYCLOSILICATE 10 G PO PACK
10.0000 g | PACK | Freq: Once | ORAL | Status: AC
  Administered 2024-05-14: 10 g via ORAL
  Filled 2024-05-14: qty 1

## 2024-05-14 NOTE — Progress Notes (Signed)
 " Progress Note    Dean Murphy   FMW:[id number]  DOB: [birth year]  DOA: 05/12/2024     2 PCP: Cleatus Arlyss RAMAN, MD  Initial CC: right flank pain, constipation   Hospital Course: Mr. [name] is a 89 yo male with PMH CKD4, HFpEF, CHB s/p PPM, PAF not on DOAC, constipation, CVA, CAD s/p CABG, DM II, chronic thrombocytopenia, anemia, PVD, osteoporosis, DDD who presented with right flank pain and constipation.  Symptoms approximately 1 week with constipation approximately 3 days prior to admission.  Denied having any fevers, chills, nausea, vomiting. Workup in the ER consisted of CT A/P which showed mild to moderate right hydronephrosis and hydroureter due to 5 mm right ureteral calculus.  Nonobstructive 3 mm right lower pole renal calculus also noted. He was afebrile with otherwise normal vitals on workup as well. WBC 8.2. Renal function showed worsening from baseline, creatinine 4.34 with BUN 61 and potassium 6.4. He underwent cystoscopy with urology and right ureteral stent placement. He was admitted for further monitoring especially for renal function and hyperkalemia.  Interval History:  No events overnight.  Tolerated Lokelma  well yesterday. Renal function and potassium improving. Foley output still noted to have dark-colored urine. Family present bedside this morning and updated as well. Patient otherwise doing okay with no complaints or concerns this morning.  Assessment and Plan: * Hydronephrosis with urinary obstruction due to ureteral calculus - per CT: mild to moderate right hydronephrosis and hydroureter due to 5 mm right ureteral calculus.  Nonobstructive 3 mm right lower pole renal calculus also noted. - s/p cystoscopy with right ureteral stent placement with urology on 05/12/2024 -No signs of sepsis on admission.  UA negative for signs of infection as well on admission - Empirically on amoxicillin  post procedure -Further management of stent per urology outpatient - Continue  Foley as per urology as well  Acute kidney injury superimposed on stage 4 chronic kidney disease (HCC) - patient has history of CKD4. Baseline creat ~ 2.3 - 2.4, eGFR~ 20-25 - patient presents with increase in creat >0.3 mg/dL above baseline or creat increase >1.5x baseline presumed to have occurred within past 7 days PTA - presumed BOO from ureteral calculus; was also still taking torsemide  at home PTA - Creatinine 4.34 on admission and hyperkalemia with 6.4 potassium - Has now undergone ureteral stent placement to relieve obstruction - Continue fluids and trend renal function - slowly downtrending/improving creat   Hyperkalemia - In setting of worsened renal function -Treated with calcium  gluconate and insulin  on admission but no treatment with medications to help excrete -Still elevated this morning; discussed his allergy of Kayexalate .  Daughter-in-law at bedside states typically has caused diarrhea in the past which is not unexpected.  Reaction in epic is noted to be itching.  Regardless, he is amenable with treatment at this time -Kayexalate  ordered 12/20 along with repeat doses of calcium  gluconate and insulin /D50 -Unable to give diuresis in setting of worsened renal function - K better this am, down to 5.4, will give 1 more dose Lokelma ; tolerated well on 12/20  Thrombocytopenia - Chronic and no signs of bleeding at this time - Baseline around 90-100 K  Hyperlipidemia associated with type 2 diabetes mellitus (HCC) - Continue Crestor   Chronic diastolic heart failure (HCC) - No signs/symptoms of exacerbation - Currently on fluids, will watch for tolerance -Torsemide  on hold  Coronary artery disease - On statin and Toprol  at home  History of CVA (cerebrovascular accident) - Continue Crestor   Status post  placement of cardiac pacemaker - Noted  Normocytic anemia - Due to anemia of chronic disease - Baseline hemoglobin around 9 to 11 g/dL.  Currently at  baseline  Paroxysmal A-fib (HCC) - Not on anticoagulation - On Toprol  at home  Complete heart block (HCC) - Pacemaker in place - Continue Toprol   GERD - Continue Protonix   Essential hypertension - Continue amlodipine  and Toprol   DM (diabetes mellitus), type 2 with renal complications (HCC) - d/c basal insulin  especially in setting of worsened renal failure - SSI only for now -A1c 7.6% on 05/13/2024   Antimicrobials: Amoxicillin  05/13/2024 >> current  DVT prophylaxis:  Place TED hose Start: 05/12/24 1803   Code Status:   Code Status: Full Code  Mobility Assessment (Last 72 Hours)     Mobility Assessment     Row Name 05/14/24 9161 05/14/24 0020 05/13/24 1308 05/12/24 2130     Does the patient have exclusion criteria? No- Perform mobility assessment No- Perform mobility assessment Yes- Hold (Level 0) - Assessment complete No- Perform mobility assessment    What is the highest level of mobility based on the mobility assessment? Level 4 (Ambulates with assistance) - Balance while stepping forward/back - Complete Level 4 (Ambulates with assistance) - Balance while stepping forward/back - Complete -- Level 4 (Ambulates with assistance) - Balance while stepping forward/back - Complete       Diet: Diet Orders (From admission, onward)     Start     Ordered   05/12/24 2131  Diet Carb Modified Room service appropriate? Yes  Diet effective now       Question Answer Comment  Diet-HS Snack? Nothing   Calorie Level Medium 1600-2000   Fluid consistency: Thin   Room service appropriate? Yes      05/12/24 2130            Barriers to discharge: None Disposition Plan: Home HH orders placed: N/A Status is: Inpatient  Objective: Blood pressure (!) 139/51, pulse (!) 59, temperature 97.8 F (36.6 C), resp. rate 18, height 5' 11 (1.803 m), weight 82.5 kg, SpO2 95%.  Examination:  Physical Exam Constitutional:      General: He is not in acute distress.    Appearance:  Normal appearance.  HENT:     Head: Normocephalic and atraumatic.     Mouth/Throat:     Mouth: Mucous membranes are moist.  Eyes:     Extraocular Movements: Extraocular movements intact.  Cardiovascular:     Rate and Rhythm: Normal rate and regular rhythm.  Pulmonary:     Effort: Pulmonary effort is normal. No respiratory distress.     Breath sounds: Normal breath sounds. No wheezing.  Abdominal:     General: Bowel sounds are normal. There is no distension.     Palpations: Abdomen is soft.     Tenderness: There is no abdominal tenderness.  Genitourinary:    Comments: Foley in place with pink/red-tinged urine in bag Musculoskeletal:        General: Normal range of motion.     Cervical back: Normal range of motion and neck supple.  Skin:    General: Skin is warm and dry.  Neurological:     General: No focal deficit present.     Mental Status: He is alert.  Psychiatric:        Mood and Affect: Mood normal.        Behavior: Behavior normal.      Consultants:  Urology  Procedures:  05/12/2024: Cystoscopy with right  ureteral stent placement  Data Reviewed: Results for orders placed or performed during the hospital encounter of 05/12/24 (from the past 24 hours)  Glucose, capillary     Status: Abnormal   Collection Time: 05/13/24  4:25 PM  Result Value Ref Range   Glucose-Capillary 226 (H) 70 - 99 mg/dL  Glucose, capillary     Status: Abnormal   Collection Time: 05/13/24  9:05 PM  Result Value Ref Range   Glucose-Capillary 216 (H) 70 - 99 mg/dL  Basic metabolic panel with GFR     Status: Abnormal   Collection Time: 05/14/24  5:40 AM  Result Value Ref Range   Sodium 142 135 - 145 mmol/L   Potassium 5.4 (H) 3.5 - 5.1 mmol/L   Chloride 117 (H) 98 - 111 mmol/L   CO2 17 (L) 22 - 32 mmol/L   Glucose, Bld 157 (H) 70 - 99 mg/dL   BUN 52 (H) 8 - 23 mg/dL   Creatinine, Ser 6.72 (H) 0.61 - 1.24 mg/dL   Calcium  8.4 (L) 8.9 - 10.3 mg/dL   GFR, Estimated 17 (L) >60 mL/min    Anion gap 8 5 - 15  CBC with Differential/Platelet     Status: Abnormal   Collection Time: 05/14/24  5:40 AM  Result Value Ref Range   WBC 3.6 (L) 4.0 - 10.5 K/uL   RBC 3.05 (L) 4.22 - 5.81 MIL/uL   Hemoglobin 9.1 (L) 13.0 - 17.0 g/dL   HCT 71.5 (L) 60.9 - 47.9 %   MCV 93.1 80.0 - 100.0 fL   MCH 29.8 26.0 - 34.0 pg   MCHC 32.0 30.0 - 36.0 g/dL   RDW 86.3 88.4 - 84.4 %   Platelets 78 (L) 150 - 400 K/uL   nRBC 0.0 0.0 - 0.2 %   Neutrophils Relative % 61 %   Neutro Abs 2.2 1.7 - 7.7 K/uL   Lymphocytes Relative 28 %   Lymphs Abs 1.0 0.7 - 4.0 K/uL   Monocytes Relative 7 %   Monocytes Absolute 0.2 0.1 - 1.0 K/uL   Eosinophils Relative 3 %   Eosinophils Absolute 0.1 0.0 - 0.5 K/uL   Basophils Relative 1 %   Basophils Absolute 0.0 0.0 - 0.1 K/uL   WBC Morphology MORPHOLOGY UNREMARKABLE    Smear Review Normal platelet morphology    Immature Granulocytes 0 %   Abs Immature Granulocytes 0.01 0.00 - 0.07 K/uL   Acanthocytes PRESENT   Magnesium      Status: None   Collection Time: 05/14/24  5:40 AM  Result Value Ref Range   Magnesium  2.2 1.7 - 2.4 mg/dL  Glucose, capillary     Status: Abnormal   Collection Time: 05/14/24  8:08 AM  Result Value Ref Range   Glucose-Capillary 149 (H) 70 - 99 mg/dL  Glucose, capillary     Status: Abnormal   Collection Time: 05/14/24 11:28 AM  Result Value Ref Range   Glucose-Capillary 187 (H) 70 - 99 mg/dL    I have reviewed pertinent nursing notes, vitals, labs, and images as necessary. I have ordered labwork to follow up on as indicated.  I have reviewed the last notes from staff over past 24 hours. I have discussed patient's care plan and test results with nursing staff, CM/SW, and other staff as appropriate.  Old records reviewed in assessment of this patient  Time spent: Greater than 50% of the 55 minute visit was spent in counseling/coordination of care for the patient as laid out in the  A&P.   LOS: 2 days   Alm Apo, MD [facility] 05/14/2024, 12:40 PM "

## 2024-05-14 NOTE — Plan of Care (Signed)
   Problem: Health Behavior/Discharge Planning: Goal: Ability to manage health-related needs will improve Outcome: Progressing   Problem: Clinical Measurements: Goal: Ability to maintain clinical measurements within normal limits will improve Outcome: Progressing Goal: Will remain free from infection Outcome: Progressing

## 2024-05-14 NOTE — Plan of Care (Signed)

## 2024-05-15 DIAGNOSIS — N179 Acute kidney failure, unspecified: Secondary | ICD-10-CM | POA: Diagnosis not present

## 2024-05-15 DIAGNOSIS — E875 Hyperkalemia: Secondary | ICD-10-CM | POA: Diagnosis not present

## 2024-05-15 DIAGNOSIS — R5381 Other malaise: Secondary | ICD-10-CM

## 2024-05-15 DIAGNOSIS — N132 Hydronephrosis with renal and ureteral calculous obstruction: Secondary | ICD-10-CM | POA: Diagnosis not present

## 2024-05-15 DIAGNOSIS — N184 Chronic kidney disease, stage 4 (severe): Secondary | ICD-10-CM | POA: Diagnosis not present

## 2024-05-15 LAB — GLUCOSE, CAPILLARY
Glucose-Capillary: 152 mg/dL — ABNORMAL HIGH (ref 70–99)
Glucose-Capillary: 174 mg/dL — ABNORMAL HIGH (ref 70–99)
Glucose-Capillary: 161 mg/dL — ABNORMAL HIGH (ref 70–99)
Glucose-Capillary: 226 mg/dL — ABNORMAL HIGH (ref 70–99)

## 2024-05-15 LAB — BASIC METABOLIC PANEL WITH GFR
Anion gap: 9 (ref 5–15)
BUN: 47 mg/dL — ABNORMAL HIGH (ref 8–23)
CO2: 19 mmol/L — ABNORMAL LOW (ref 22–32)
Calcium: 8.9 mg/dL (ref 8.9–10.3)
Chloride: 114 mmol/L — ABNORMAL HIGH (ref 98–111)
Creatinine, Ser: 3.24 mg/dL — ABNORMAL HIGH (ref 0.61–1.24)
GFR, Estimated: 17 mL/min — ABNORMAL LOW
Glucose, Bld: 178 mg/dL — ABNORMAL HIGH (ref 70–99)
Potassium: 5.6 mmol/L — ABNORMAL HIGH (ref 3.5–5.1)
Sodium: 142 mmol/L (ref 135–145)

## 2024-05-15 MED ORDER — SODIUM ZIRCONIUM CYCLOSILICATE 10 G PO PACK
10.0000 g | PACK | Freq: Once | ORAL | Status: AC
  Administered 2024-05-15: 10 g via ORAL
  Filled 2024-05-15: qty 1

## 2024-05-15 NOTE — Assessment & Plan Note (Signed)
-   Given prolonged hospitalization and advanced age, will ask for PT evaluation prior to discharge also.  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

## 2024-05-15 NOTE — Evaluation (Signed)
 Physical Therapy Evaluation Patient Details Name: Dean Murphy MRN: [id number] DOB: 1926-09-05 Today's Date: 05/15/2024  History of Present Illness  [name] is a 89 yo male who presented 05/12/24 with right flank pain and constipation. CT A/P which showed mild to moderate right hydronephrosis and hydroureter due to 5 mm right ureteral calculus .S/P Cystoscopy, Right retrograde pyelogram with interpretation, Right ureteral stent placement.  PMH CKD4, HFpEF, CHB s/p PPM, PAF not on DOAC, constipation, CVA, CAD s/p CABG, DM II, chronic thrombocytopenia, anemia, PVD, osteoporosis, DDD  Clinical Impression  Pt admitted with above diagnosis.  Pt currently with functional limitations due to the deficits listed below (see PT Problem List). Pt will benefit from acute skilled PT to increase their independence and safety with mobility to allow discharge.   The patient ambulated x 400' using RW  And Supervision. Patient gait steady.  Patient is independent with RW in home and cane outside, still drives and shops.  Patient does look after wife who has dementia but does not require physical assistance.  Patient's granddaughter present.  PT recommend HHPT. PT will assess use of rollator as patient wants to use Rw when going outside.         If plan is discharge home, recommend the following: A little help with walking and/or transfers;Assist for transportation;Help with stairs or ramp for entrance   Can travel by private vehicle        Equipment Recommendations  (will  assess need for rollator)  Recommendations for Other Services       Functional Status Assessment Patient has had a recent decline in their functional status and demonstrates the ability to make significant improvements in function in a reasonable and predictable amount of time.     Precautions / Restrictions Precautions Precautions: Fall Restrictions Weight Bearing Restrictions Per Provider Order: No      Mobility  Bed  Mobility Overal bed mobility: Needs Assistance Bed Mobility: Rolling, Sidelying to Sit Rolling: Supervision Sidelying to sit: Min assist       General bed mobility comments: light min to sit uright    Transfers Overall transfer level: Needs assistance Equipment used: Rolling walker (2 wheels) Transfers: Sit to/from Stand Sit to Stand: Supervision           General transfer comment: cues for hand placement    Ambulation/Gait Ambulation/Gait assistance: Supervision Gait Distance (Feet): 400 Feet Assistive device: Rolling walker (2 wheels) Gait Pattern/deviations: Step-to pattern, Step-through pattern, Trunk flexed Gait velocity: decr     General Gait Details: cues to stand more erect.  Stairs            Wheelchair Mobility     Tilt Bed    Modified Rankin (Stroke Patients Only)       Balance Overall balance assessment: Needs assistance Sitting-balance support: Feet supported, No upper extremity supported Sitting balance-Leahy Scale: Good     Standing balance support: Reliant on assistive device for balance, During functional activity Standing balance-Leahy Scale: Fair                               Pertinent Vitals/Pain Pain Assessment Pain Assessment: No/denies pain    Home Living Family/patient expects to be discharged to:: Private residence Living Arrangements: Spouse/significant other Available Help at Discharge: Family Type of Home: House Home Access: Stairs to enter Entrance Stairs-Rails: Left Entrance Stairs-Number of Steps: 3   Home Layout: One level Home Equipment: [employer] (  2 wheels);Cane - single point Additional Comments: wife has dementia but can care for self    Prior Function Prior Level of Function : Independent/Modified Independent;Driving             Mobility Comments: uses Rw in home and cane outside, states he wants to use RW outside now ADLs Comments: indep with B/D, meals     Extremity/Trunk  Assessment   Upper Extremity Assessment Upper Extremity Assessment: Overall WFL for tasks assessed    Lower Extremity Assessment Lower Extremity Assessment: Overall WFL for tasks assessed    Cervical / Trunk Assessment Cervical / Trunk Assessment: Kyphotic  Communication   Communication Communication: Impaired Factors Affecting Communication: Hearing impaired    Cognition Arousal: Alert Behavior During Therapy: WFL for tasks assessed/performed   PT - Cognitive impairments: No apparent impairments                         Following commands: Intact       Cueing       General Comments      Exercises     Assessment/Plan    PT Assessment Patient needs continued PT services  PT Problem List Decreased activity tolerance       PT Treatment Interventions DME instruction;Functional mobility training;Therapeutic activities;Patient/family education    PT Goals (Current goals can be found in the Care Plan section)  Acute Rehab PT Goals Patient Stated Goal: go home PT Goal Formulation: With patient/family Time For Goal Achievement: 05/29/24 Potential to Achieve Goals: Good    Frequency Min 3X/week     Co-evaluation               AM-PAC PT 6 Clicks Mobility  Outcome Measure Help needed turning from your back to your side while in a flat bed without using bedrails?: None Help needed moving from lying on your back to sitting on the side of a flat bed without using bedrails?: A Little Help needed moving to and from a bed to a chair (including a wheelchair)?: A Little Help needed standing up from a chair using your arms (e.g., wheelchair or bedside chair)?: A Little Help needed to walk in hospital room?: A Little Help needed climbing 3-5 steps with a railing? : A Little 6 Click Score: 19    End of Session Equipment Utilized During Treatment: Gait belt Activity Tolerance: Patient tolerated treatment well Patient left: in chair;with call bell/phone  within reach;with family/visitor present Nurse Communication: Mobility status PT Visit Diagnosis: Unsteadiness on feet (R26.81)    Time: 8649-8578 PT Time Calculation (min) (ACUTE ONLY): 31 min   Charges:   PT Evaluation $PT Eval Low Complexity: 1 Low PT Treatments $Gait Training: 8-22 mins PT General Charges $$ ACUTE PT VISIT: 1 Visit           Darice Potters PT Acute Rehabilitation Services Office [phone]   Potters Darice Norris 05/15/2024, 2:39 PM

## 2024-05-15 NOTE — Progress Notes (Signed)
 " Progress Note    Dean Murphy   FMW:[id number]  DOB: March 01, 1927  DOA: 05/12/2024     3 PCP: Cleatus Arlyss RAMAN, MD  Initial CC: right flank pain, constipation   Hospital Course: Dean Murphy is a 89 yo male with PMH CKD4, HFpEF, CHB s/p PPM, PAF not on DOAC, constipation, CVA, CAD s/p CABG, DM II, chronic thrombocytopenia, anemia, PVD, osteoporosis, DDD who presented with right flank pain and constipation.  Symptoms approximately 1 week with constipation approximately 3 days prior to admission.  Denied having any fevers, chills, nausea, vomiting. Workup in the ER consisted of CT A/P which showed mild to moderate right hydronephrosis and hydroureter due to 5 mm right ureteral calculus.  Nonobstructive 3 mm right lower pole renal calculus also noted. He was afebrile with otherwise normal vitals on workup as well. WBC 8.2. Renal function showed worsening from baseline, creatinine 4.34 with BUN 61 and potassium 6.4. He underwent cystoscopy with urology and right ureteral stent placement. He was admitted for further monitoring especially for renal function and hyperkalemia.  Interval History:  No events overnight.  Tolerated Lokelma  well.  Family present bedside this morning.  Given concern for weakness, we will have PT evaluate. Otherwise continues to improve in terms of renal function. Catheter remains in place and he is hoping to not need it at discharge.  Assessment and Plan: * Hydronephrosis with urinary obstruction due to ureteral calculus - per CT: mild to moderate right hydronephrosis and hydroureter due to 5 mm right ureteral calculus.  Nonobstructive 3 mm right lower pole renal calculus also noted. - s/p cystoscopy with right ureteral stent placement with urology on 05/12/2024 -No signs of sepsis on admission.  UA negative for signs of infection as well on admission - Empirically on amoxicillin  post procedure -Further management of stent per urology outpatient - Continue Foley as per  urology as well  Acute kidney injury superimposed on stage 4 chronic kidney disease (HCC) - patient has history of CKD4. Baseline creat ~ 2.3 - 2.4, eGFR~ 20-25 - patient presents with increase in creat >0.3 mg/dL above baseline or creat increase >1.5x baseline presumed to have occurred within past 7 days PTA - presumed BOO from ureteral calculus; was also still taking torsemide  at home PTA - Creatinine 4.34 on admission and hyperkalemia with 6.4 potassium - Has now undergone ureteral stent placement to relieve obstruction - Continue fluids and trend renal function - slowly downtrending/improving creat   Hyperkalemia - In setting of worsened renal function -Treated with calcium  gluconate and insulin  on admission but no treatment with medications to help excrete -Still elevated this morning; discussed his allergy of Kayexalate .  Daughter-in-law at bedside states typically has caused diarrhea in the past which is not unexpected.  Reaction in epic is noted to be itching.  Regardless, he is amenable with treatment at this time -Kayexalate  ordered 12/20 along with repeat doses of calcium  gluconate and insulin /D50 -Unable to give diuresis in setting of worsened renal function - K better this am, down to 5.4, will give 1 more dose Lokelma ; tolerated well on 12/20  Physical deconditioning - Given prolonged hospitalization and advanced age, will ask for PT evaluation prior to discharge also.  He lives with wife who has dementia so he is her caretaker -Appreciate PT evaluation, home health PT recommended  Thrombocytopenia - Chronic and no signs of bleeding at this time - Baseline around 90-100 K  Hyperlipidemia associated with type 2 diabetes mellitus (HCC) - Continue Crestor   Chronic diastolic heart failure (HCC) - No signs/symptoms of exacerbation - Currently on fluids, will watch for tolerance -Torsemide  on hold  Coronary artery disease - On statin and Toprol  at home  History of CVA  (cerebrovascular accident) - Continue Crestor   Status post placement of cardiac pacemaker - Noted  Normocytic anemia - Due to anemia of chronic disease - Baseline hemoglobin around 9 to 11 g/dL.  Currently at baseline  Paroxysmal A-fib (HCC) - Not on anticoagulation - On Toprol  at home  Complete heart block (HCC) - Pacemaker in place - Continue Toprol   GERD - Continue Protonix   Essential hypertension - Continue amlodipine  and Toprol   DM (diabetes mellitus), type 2 with renal complications (HCC) - d/c basal insulin  especially in setting of worsened renal failure - SSI only for now -A1c 7.6% on 05/13/2024   Antimicrobials: Amoxicillin  05/13/2024 >> current  DVT prophylaxis:  Place TED hose Start: 05/12/24 1803   Code Status:   Code Status: Full Code  Mobility Assessment (Last 72 Hours)     Mobility Assessment     Row Name 05/15/24 1433 05/15/24 1000 05/14/24 2145 05/14/24 0838 05/14/24 0020   Does the patient have exclusion criteria? -- No- Perform mobility assessment No- Perform mobility assessment No- Perform mobility assessment No- Perform mobility assessment   What is the highest level of mobility based on the mobility assessment? Level 4 (Ambulates with assistance) - Balance while stepping forward/back - Complete Level 4 (Ambulates with assistance) - Balance while stepping forward/back - Complete Level 4 (Ambulates with assistance) - Balance while stepping forward/back - Complete Level 4 (Ambulates with assistance) - Balance while stepping forward/back - Complete Level 4 (Ambulates with assistance) - Balance while stepping forward/back - Complete    Row Name 05/13/24 1308 05/12/24 2130         Does the patient have exclusion criteria? Yes- Hold (Level 0) - Assessment complete No- Perform mobility assessment      What is the highest level of mobility based on the mobility assessment? -- Level 4 (Ambulates with assistance) - Balance while stepping forward/back -  Complete         Diet: Diet Orders (From admission, onward)     Start     Ordered   05/12/24 2131  Diet Carb Modified Room service appropriate? Yes  Diet effective now       Question Answer Comment  Diet-HS Snack? Nothing   Calorie Level Medium 1600-2000   Fluid consistency: Thin   Room service appropriate? Yes      05/12/24 2130            Barriers to discharge: None Disposition Plan: Home HH orders placed: N/A Status is: Inpatient  Objective: Blood pressure (!) 152/56, pulse 70, temperature 97.9 F (36.6 C), temperature source Oral, resp. rate 20, height 5' 11 (1.803 m), weight 82.5 kg, SpO2 95%.  Examination:  Physical Exam Constitutional:      General: He is not in acute distress.    Appearance: Normal appearance.  HENT:     Head: Normocephalic and atraumatic.     Mouth/Throat:     Mouth: Mucous membranes are moist.  Eyes:     Extraocular Movements: Extraocular movements intact.  Cardiovascular:     Rate and Rhythm: Normal rate and regular rhythm.  Pulmonary:     Effort: Pulmonary effort is normal. No respiratory distress.     Breath sounds: Normal breath sounds. No wheezing.  Abdominal:     General: Bowel sounds are normal.  There is no distension.     Palpations: Abdomen is soft.     Tenderness: There is no abdominal tenderness.  Genitourinary:    Comments: Foley in place with pink/red-tinged urine in bag Musculoskeletal:        General: Normal range of motion.     Cervical back: Normal range of motion and neck supple.  Skin:    General: Skin is warm and dry.  Neurological:     General: No focal deficit present.     Mental Status: He is alert.  Psychiatric:        Mood and Affect: Mood normal.        Behavior: Behavior normal.      Consultants:  Urology  Procedures:  05/12/2024: Cystoscopy with right ureteral stent placement  Data Reviewed: Results for orders placed or performed during the hospital encounter of 05/12/24 (from the past 24  hours)  Basic metabolic panel     Status: Abnormal   Collection Time: 05/14/24  3:18 PM  Result Value Ref Range   Sodium 140 135 - 145 mmol/L   Potassium 5.6 (H) 3.5 - 5.1 mmol/L   Chloride 113 (H) 98 - 111 mmol/L   CO2 16 (L) 22 - 32 mmol/L   Glucose, Bld 214 (H) 70 - 99 mg/dL   BUN 50 (H) 8 - 23 mg/dL   Creatinine, Ser 6.52 (H) 0.61 - 1.24 mg/dL   Calcium  8.6 (L) 8.9 - 10.3 mg/dL   GFR, Estimated 16 (L) >60 mL/min   Anion gap 11 5 - 15  Glucose, capillary     Status: Abnormal   Collection Time: 05/14/24  4:30 PM  Result Value Ref Range   Glucose-Capillary 209 (H) 70 - 99 mg/dL  Glucose, capillary     Status: Abnormal   Collection Time: 05/14/24  9:33 PM  Result Value Ref Range   Glucose-Capillary 166 (H) 70 - 99 mg/dL  Basic metabolic panel with GFR     Status: Abnormal   Collection Time: 05/15/24  4:58 AM  Result Value Ref Range   Sodium 142 135 - 145 mmol/L   Potassium 5.6 (H) 3.5 - 5.1 mmol/L   Chloride 114 (H) 98 - 111 mmol/L   CO2 19 (L) 22 - 32 mmol/L   Glucose, Bld 178 (H) 70 - 99 mg/dL   BUN 47 (H) 8 - 23 mg/dL   Creatinine, Ser 6.75 (H) 0.61 - 1.24 mg/dL   Calcium  8.9 8.9 - 10.3 mg/dL   GFR, Estimated 17 (L) >60 mL/min   Anion gap 9 5 - 15  Glucose, capillary     Status: Abnormal   Collection Time: 05/15/24  7:59 AM  Result Value Ref Range   Glucose-Capillary 152 (H) 70 - 99 mg/dL  Glucose, capillary     Status: Abnormal   Collection Time: 05/15/24 12:34 PM  Result Value Ref Range   Glucose-Capillary 174 (H) 70 - 99 mg/dL    I have reviewed pertinent nursing notes, vitals, labs, and images as necessary. I have ordered labwork to follow up on as indicated.  I have reviewed the last notes from staff over past 24 hours. I have discussed patient's care plan and test results with nursing staff, CM/SW, and other staff as appropriate.  Old records reviewed in assessment of this patient  Time spent: Greater than 50% of the 55 minute visit was spent in  counseling/coordination of care for the patient as laid out in the A&P.   LOS: 3  days   Alm Apo, MD [facility] 05/15/2024, 3:11 PM "

## 2024-05-15 NOTE — Plan of Care (Signed)

## 2024-05-16 DIAGNOSIS — N184 Chronic kidney disease, stage 4 (severe): Secondary | ICD-10-CM | POA: Diagnosis not present

## 2024-05-16 DIAGNOSIS — N179 Acute kidney failure, unspecified: Secondary | ICD-10-CM | POA: Diagnosis not present

## 2024-05-16 DIAGNOSIS — R5381 Other malaise: Secondary | ICD-10-CM | POA: Diagnosis not present

## 2024-05-16 DIAGNOSIS — N132 Hydronephrosis with renal and ureteral calculous obstruction: Secondary | ICD-10-CM | POA: Diagnosis not present

## 2024-05-16 LAB — BASIC METABOLIC PANEL WITH GFR
Anion gap: 10 (ref 5–15)
BUN: 38 mg/dL — ABNORMAL HIGH (ref 8–23)
CO2: 19 mmol/L — ABNORMAL LOW (ref 22–32)
Calcium: 8.8 mg/dL — ABNORMAL LOW (ref 8.9–10.3)
Chloride: 116 mmol/L — ABNORMAL HIGH (ref 98–111)
Creatinine, Ser: 2.53 mg/dL — ABNORMAL HIGH (ref 0.61–1.24)
GFR, Estimated: 23 mL/min — ABNORMAL LOW
Glucose, Bld: 161 mg/dL — ABNORMAL HIGH (ref 70–99)
Potassium: 4.7 mmol/L (ref 3.5–5.1)
Sodium: 144 mmol/L (ref 135–145)

## 2024-05-16 LAB — GLUCOSE, CAPILLARY
Glucose-Capillary: 154 mg/dL — ABNORMAL HIGH (ref 70–99)
Glucose-Capillary: 155 mg/dL — ABNORMAL HIGH (ref 70–99)

## 2024-05-16 NOTE — Progress Notes (Signed)
 Mobility Specialist Progress Note:   05/16/24 1444  Mobility  Activity Ambulated with assistance  Level of Assistance Contact guard assist, steadying assist  Assistive Device Four wheel walker  Distance Ambulated (ft) 190 ft  Activity Response Tolerated well  Mobility Referral Yes  Mobility visit 1 Mobility  Mobility Specialist Start Time (ACUTE ONLY) 1355  Mobility Specialist Stop Time (ACUTE ONLY) 1411  Mobility Specialist Time Calculation (min) (ACUTE ONLY) 16 min   Pt was received from bathroom and agreed to mobility. No complaints/issues during ambulation. Returned to recliner with all needs met, call bell in reach. Left in room with family.  [employer] - Mobility Specialist

## 2024-05-16 NOTE — TOC Transition Note (Signed)
 Transition of Care [facility]) - Discharge Note   Patient Details  Name: Dean Murphy MRN: [id number] Date of Birth: [birth year]  [facility] of [facility] CM/SW Contact:  Heather DELENA Saltness, LCSW Phone Number: 05/16/2024, 2:00 PM   Clinical Narrative:    Pt discharging home today with [facility] PT/OT/Aide services. CSW met with pt and spouse at bedside to discuss discharge planning. Pt agreeable to [facility] services and rollator. Pt's preferred [facility] agency is [facility]. CSW spoke with Artavia with [facility] who reports no ability to accept pt due to short-staffing. CSW spoke with Cindie at [facility], who confirms ability to accept pt for services. Rollator ordered through [employer], to be delivered to bedside. HH orders placed. Pt's spouse, Javelle Donigan, to transport pt home upon discharge.   Final next level of care: Home w Home Health Services Barriers to Discharge: Barriers Resolved   Patient Goals and CMS Choice Patient states their goals for this hospitalization and ongoing recovery are:: To return home CMS Medicare.gov Compare Post Acute Care list provided to:: Patient Choice offered to / list presented to : Patient [place] ownership interest in [facility].provided to:: Patient    Discharge Placement  Home with [facility] services              Patient to be transferred to facility by: Spouse Name of family member notified: Eryk Beavers Patient and family notified of of transfer: 05/16/24  Discharge Plan and Services Additional resources added to the After Visit Summary for  Follow Up                DME Arranged: Walker rolling with seat DME Agency: [employer] Date DME Agency Contacted: 05/16/24 Time DME Agency Contacted: 1356 Representative spoke with at DME Agency: [place] HH Arranged: PT, OT, Nurse's Aide HH Agency: [facility] Date [facility] Agency Contacted: 05/16/24 Time HH Agency Contacted: 1356 Representative spoke with at [facility] Agency: Cindie  Social Drivers of  Health (SDOH) Interventions SDOH Screenings   Food Insecurity: No Food Insecurity (05/12/2024)  Housing: Low Risk (05/13/2024)  Transportation Needs: No Transportation Needs (05/12/2024)  Utilities: Not At Risk (05/12/2024)  Alcohol  Screen: Low Risk (01/20/2024)  Depression (PHQ2-9): Low Risk (01/20/2024)  Financial Resource Strain: Low Risk (01/20/2024)  Physical Activity: Insufficiently Active (01/20/2024)  Social Connections: Moderately Integrated (05/12/2024)  Stress: No Stress Concern Present (01/20/2024)  Tobacco Use: Medium Risk (05/12/2024)  Health Literacy: Adequate Health Literacy (01/18/2024)     Readmission Risk Interventions    05/16/2024    1:59 PM  Readmission Risk Prevention Plan  Transportation Screening Complete  PCP or Specialist Appt within 5-7 Days Complete  Home Care Screening Complete  Medication Review (RN CM) Complete    Signed: Heather Saltness, MSW, LCSW Clinical Social Worker Inpatient Care Management 05/16/2024 2:00 PM

## 2024-05-16 NOTE — Plan of Care (Signed)

## 2024-05-16 NOTE — Discharge Summary (Signed)
 " Physician Discharge Summary   Dean Murphy FMW:[id number] DOB: 11/27/1926 DOA: 05/12/2024  PCP: Cleatus Arlyss RAMAN, MD  Admit date: 05/12/2024 Discharge date: 05/16/2024  Admitted From: Home Disposition:  Home  Discharging physician: Alm Apo, MD Barriers to discharge: none  Recommendations at discharge: Repeat BMP within 1 week Resume torsemide  if renal function is back to baseline  Follow up potassium given hyperkalemia  Follow up with urology for stent and stone management  Home Health: PT Equipment/Devices: Rollator   Discharge Condition: stable CODE STATUS: Full  Diet recommendation:  Diet Orders (From admission, onward)     Start     Ordered   05/16/24 0000  Diet Carb Modified        05/16/24 1307   05/12/24 2131  Diet Carb Modified Room service appropriate? Yes  Diet effective now       Question Answer Comment  Diet-HS Snack? Nothing   Calorie Level Medium 1600-2000   Fluid consistency: Thin   Room service appropriate? Yes      05/12/24 2130            Hospital Course: Dean Murphy is a 89 yo male with PMH CKD4, HFpEF, CHB s/p PPM, PAF not on DOAC, constipation, CVA, CAD s/p CABG, DM II, chronic thrombocytopenia, anemia, PVD, osteoporosis, DDD who presented with right flank pain and constipation.  Symptoms approximately 1 week with constipation approximately 3 days prior to admission.  Denied having any fevers, chills, nausea, vomiting. Workup in the ER consisted of CT A/P which showed mild to moderate right hydronephrosis and hydroureter due to 5 mm right ureteral calculus.  Nonobstructive 3 mm right lower pole renal calculus also noted. He was afebrile with otherwise normal vitals on workup as well. WBC 8.2. Renal function showed worsening from baseline, creatinine 4.34 with BUN 61 and potassium 6.4. He underwent cystoscopy with urology and right ureteral stent placement. He was admitted for further monitoring especially for renal function and  hyperkalemia.  Assessment and Plan: * Hydronephrosis with urinary obstruction due to ureteral calculus - per CT: mild to moderate right hydronephrosis and hydroureter due to 5 mm right ureteral calculus.  Nonobstructive 3 mm right lower pole renal calculus also noted. - s/p cystoscopy with right ureteral stent placement with urology on 05/12/2024 -No signs of sepsis on admission.  UA negative for signs of infection as well on admission - Empirically on amoxicillin  post procedure -Further management of stent per urology outpatient - Foley 05/16/2024 prior to discharge given improvement in renal function.  Also discussed with urology prior to removal  Acute kidney injury superimposed on stage 4 chronic kidney disease (HCC) - patient has history of CKD4. Baseline creat ~ 2.3 - 2.4, eGFR~ 20-25 - patient presents with increase in creat >0.3 mg/dL above baseline or creat increase >1.5x baseline presumed to have occurred within past 7 days PTA - presumed BOO from ureteral calculus; was also still taking torsemide  at home PTA - Creatinine 4.34 on admission and hyperkalemia with 6.4 potassium - Has now undergone ureteral stent placement to relieve obstruction -Renal function has steadily improved.  Catheter removed prior to discharge -Repeat BMP at follow-up  Hyperkalemia-resolved as of 05/16/2024 - In setting of worsened renal function -Treated with calcium  gluconate and insulin  on admission but no treatment with medications to help excrete -Still elevated this morning; discussed his allergy of Kayexalate .  Daughter-in-law at bedside states typically has caused diarrhea in the past which is not unexpected.  Reaction in epic is noted  to be itching.  Regardless, he is amenable with treatment at this time -Kayexalate  ordered 12/20 along with repeat doses of calcium  gluconate and insulin /D50 -Unable to give diuresis in setting of worsened renal function - Responded well to Lokelma  doses - Potassium  normalized, 4.7 on discharge  Physical deconditioning - Given prolonged hospitalization and advanced age, will ask for PT evaluation prior to discharge also.  He lives with wife who has dementia so he is her caretaker -Appreciate PT evaluation, home health PT recommended  Chronic diastolic heart failure (HCC) - No signs/symptoms of exacerbation - Tolerated fluids -Torsemide  held until follow-up with primary care and repeat BMP prior to resumption  Thrombocytopenia - Chronic and no signs of bleeding at this time - Baseline around 90-100 K  Hyperlipidemia associated with type 2 diabetes mellitus (HCC) - Continue Crestor   Coronary artery disease - On statin and Toprol  at home  History of CVA (cerebrovascular accident) - Continue Crestor   Status post placement of cardiac pacemaker - Noted  Normocytic anemia - Due to anemia of chronic disease - Baseline hemoglobin around 9 to 11 g/dL.  Currently at baseline  Paroxysmal A-fib (HCC) - Not on anticoagulation - On Toprol  at home  Complete heart block (HCC) - Pacemaker in place - Continue Toprol   GERD - Continue Protonix   Essential hypertension - Continue amlodipine  and Toprol   DM (diabetes mellitus), type 2 with renal complications (HCC) - d/c basal insulin  especially in setting of worsened renal failure - SSI only for now -A1c 7.6% on 05/13/2024       The patient's acute and chronic medical conditions were treated accordingly. On day of discharge, patient was felt deemed stable for discharge. Patient/family member advised to call PCP or come back to ER if needed.   Principal Diagnosis: Hydronephrosis with urinary obstruction due to ureteral calculus  Discharge Diagnoses: Active Hospital Problems   Diagnosis Date Noted   Hydronephrosis with urinary obstruction due to ureteral calculus 05/12/2024    Priority: 1.   Acute kidney injury superimposed on stage 4 chronic kidney disease (HCC) 05/12/2024    Priority: 2.    Physical deconditioning 09/16/2009    Priority: 4.   Chronic diastolic heart failure (HCC) 08/05/2020    Priority: 5.   Thrombocytopenia 05/12/2024   Hyperlipidemia associated with type 2 diabetes mellitus (HCC) 01/24/2024   Coronary artery disease 07/29/2015   History of CVA (cerebrovascular accident) 12/24/2014   Status post placement of cardiac pacemaker 11/16/2014   Normocytic anemia 09/27/2014   Paroxysmal A-fib (HCC) 08/13/2014   Complete heart block (HCC) 08/11/2014   DM (diabetes mellitus), type 2 with renal complications (HCC) 10/01/2009   Essential hypertension 11/15/2006   GERD 11/15/2006    Resolved Hospital Problems   Diagnosis Date Noted Date Resolved   Hyperkalemia 05/12/2024 05/16/2024    Priority: 3.     Discharge Instructions     Diet Carb Modified   Complete by: As directed    Increase activity slowly   Complete by: As directed       Allergies as of 05/16/2024       Reactions   Zoledronic  Acid Other (See Comments)   Discontinued in 2015 due to progressive decrease in renal function (CKD-III/IV)   Nsaids Other (See Comments)   Held due to creatinine elevation.     Lokelma  [sodium Zirconium Cyclosilicate ] Nausea Only   Itching; tolerated doses well Dec 2025        Medication List     PAUSE taking these medications  torsemide  10 MG tablet Wait to take this until your doctor or other care provider tells you to start again. Do not resume until instructed by primary care after repeat bloodwork Commonly known as: DEMADEX  Take 1 tablet (10 mg total) by mouth 2 (two) times a week. Monday and Friday What changed:  when to take this additional instructions       TAKE these medications    amLODipine  10 MG tablet Commonly known as: NORVASC  Take 10 mg by mouth daily.   amoxicillin  500 MG capsule Commonly known as: AMOXIL  Take 1 capsule (500 mg total) by mouth 2 (two) times daily.   B-D UF III MINI PEN NEEDLES 31G X 5 MM Misc Generic  drug: Insulin  Pen Needle USE DAILY WITH INSULIN  PEN   cholecalciferol  25 MCG (1000 UNIT) tablet Commonly known as: VITAMIN D3 Take 1,000 Units by mouth daily.   clotrimazole -betamethasone  cream Commonly known as: LOTRISONE  APPLY TOPICALLY 2 TIMES DAILY AS NEEDED. What changed: See the new instructions.   cyanocobalamin  1000 MCG tablet Commonly known as: VITAMIN B12 Take 1 tablet (1,000 mcg total) by mouth daily.   dorzolamide -timolol  2-0.5 % ophthalmic solution Commonly known as: COSOPT  Place 1 drop into both eyes daily in the afternoon. In both eye   Iron  (Ferrous Sulfate ) 325 (65 Fe) MG Tabs Take 325 mg by mouth 3 (three) times a week.   Lantus  SoloStar 100 UNIT/ML Solostar Pen Generic drug: insulin  glargine INJECT 46 UNITS INTO THE SKIN DAILY. What changed: See the new instructions.   metoprolol  succinate 25 MG 24 hr tablet Commonly known as: Toprol  XL Take 1 tablet (25 mg total) by mouth at bedtime.   pantoprazole  40 MG tablet Commonly known as: PROTONIX  TAKE 1 TABLET BY MOUTH TWICE A DAY   PreserVision AREDS 2 Caps Take 1 capsule by mouth daily.   rosuvastatin  20 MG tablet Commonly known as: CRESTOR  Take 1 tablet (20 mg total) by mouth daily.   vitamin C  250 MG tablet Commonly known as: ASCORBIC ACID  Take 250 mg by mouth 3 (three) times a week. M-W-F               Durable Medical Equipment  (From admission, onward)           Start     Ordered   05/16/24 1200  For home use only DME 4 wheeled rolling walker with seat  Once       Question:  Patient needs a walker to treat with the following condition  Answer:  Generalized muscle weakness   05/16/24 1159            Follow-up Information     Cleatus Arlyss RAMAN, MD. Schedule an appointment as soon as possible for a visit in 1 week(s).   Specialty: Family Medicine Why: For blood work check (kidney function and potassium) Contact information: [address] [place]  [address] [phone]         [facility]) Follow up.   Specialty: Home Health Services Why: This provider will reach out to you next week to begin Home Health PT/OT and Aide services. Contact information: [address] [place]  [address] [phone]               Allergies[1]  Consultations: Urology  Procedures:  05/12/2024: Cystoscopy with right ureteral stent placement  Discharge Exam: BP (!) 138/48 (BP Location: Right Arm)   Pulse 70   Temp 97.7 F (36.5  C) (Oral)   Resp 19   Ht 5' 11 (1.803 m)   Wt 82.5 kg   SpO2 95%   BMI 25.37 kg/m  Physical Exam Constitutional:      General: He is not in acute distress.    Appearance: Normal appearance.  HENT:     Head: Normocephalic and atraumatic.     Mouth/Throat:     Mouth: Mucous membranes are moist.  Eyes:     Extraocular Movements: Extraocular movements intact.  Cardiovascular:     Rate and Rhythm: Normal rate and regular rhythm.  Pulmonary:     Effort: Pulmonary effort is normal. No respiratory distress.     Breath sounds: Normal breath sounds. No wheezing.  Abdominal:     General: Bowel sounds are normal. There is no distension.     Palpations: Abdomen is soft.     Tenderness: There is no abdominal tenderness.  Musculoskeletal:        General: Normal range of motion.     Cervical back: Normal range of motion and neck supple.  Skin:    General: Skin is warm and dry.  Neurological:     General: No focal deficit present.     Mental Status: He is alert.  Psychiatric:        Mood and Affect: Mood normal.        Behavior: Behavior normal.      The results of significant diagnostics from this hospitalization (including imaging, microbiology, ancillary and laboratory) are listed below for reference.   Microbiology: No results found for this or any previous visit (from the past 240 hours).   Labs: BNP (last 3 results) No results for input(s): BNP  in the last 8760 hours. Basic Metabolic Panel: Recent Labs  Lab 05/13/24 0536 05/14/24 0540 05/14/24 1518 05/15/24 0458 05/16/24 0758  NA 144 142 140 142 144  K 6.6* 5.4* 5.6* 5.6* 4.7  CL 114* 117* 113* 114* 116*  CO2 21* 17* 16* 19* 19*  GLUCOSE 170* 157* 214* 178* 161*  BUN 56* 52* 50* 47* 38*  CREATININE 4.07* 3.27* 3.47* 3.24* 2.53*  CALCIUM  8.8* 8.4* 8.6* 8.9 8.8*  MG 2.2 2.2  --   --   --   PHOS 3.9  --   --   --   --    Liver Function Tests: Recent Labs  Lab 05/12/24 1558 05/13/24 0536  AST 43*  --   ALT 49*  --   ALKPHOS 307*  --   BILITOT 0.4  --   PROT 6.6  --   ALBUMIN 3.6 3.1*   Recent Labs  Lab 05/12/24 1558  LIPASE 25   No results for input(s): AMMONIA in the last 168 hours. CBC: Recent Labs  Lab 05/12/24 1558 05/12/24 1840 05/13/24 0536 05/14/24 0540  WBC 8.2  --  4.7 3.6*  NEUTROABS 6.2  --   --  2.2  HGB 10.9* 10.9* 9.1* 9.1*  HCT 33.7* 32.0* 28.1* 28.4*  MCV 92.6  --  92.4 93.1  PLT 92*  --  77* 78*   Cardiac Enzymes: No results for input(s): CKTOTAL, CKMB, CKMBINDEX, TROPONINI in the last 168 hours. BNP: Invalid input(s): POCBNP CBG: Recent Labs  Lab 05/15/24 1234 05/15/24 1753 05/15/24 2050 05/16/24 0741 05/16/24 1157  GLUCAP 174* 161* 226* 154* 155*   D-Dimer No results for input(s): DDIMER in the last 72 hours. Hgb A1c No results for input(s): HGBA1C in the last 72 hours. Lipid Profile No results for  input(s): CHOL, HDL, LDLCALC, TRIG, CHOLHDL, LDLDIRECT in the last 72 hours. Thyroid  function studies No results for input(s): TSH, T4TOTAL, T3FREE, THYROIDAB in the last 72 hours.  Invalid input(s): FREET3 Anemia work up No results for input(s): VITAMINB12, FOLATE, FERRITIN, TIBC, IRON , RETICCTPCT in the last 72 hours. Urinalysis    Component Value Date/Time   COLORURINE YELLOW 05/12/2024 1740   APPEARANCEUR CLEAR 05/12/2024 1740   LABSPEC 1.011 05/12/2024 1740    PHURINE 5.0 05/12/2024 1740   GLUCOSEU 50 (A) 05/12/2024 1740   HGBUR SMALL (A) 05/12/2024 1740   HGBUR negative 09/16/2009 1053   BILIRUBINUR NEGATIVE 05/12/2024 1740   BILIRUBINUR n 01/30/2011 1104   KETONESUR NEGATIVE 05/12/2024 1740   PROTEINUR 30 (A) 05/12/2024 1740   UROBILINOGEN 0.2 02/05/2011 1008   NITRITE NEGATIVE 05/12/2024 1740   LEUKOCYTESUR NEGATIVE 05/12/2024 1740   Sepsis Labs Recent Labs  Lab 05/12/24 1558 05/13/24 0536 05/14/24 0540  WBC 8.2 4.7 3.6*   Microbiology No results found for this or any previous visit (from the past 240 hours).  Procedures/Studies: DG C-Arm 1-60 Min-No Report Result Date: 05/12/2024 Fluoroscopy was utilized by the requesting physician.  No radiographic interpretation.   CT ABDOMEN PELVIS WO CONTRAST Result Date: 05/12/2024 EXAM: CT ABDOMEN AND PELVIS WITHOUT CONTRAST 05/12/2024 03:37:34 PM TECHNIQUE: CT of the abdomen and pelvis was performed without the administration of intravenous contrast. Multiplanar reformatted images are provided for review. Automated exposure control, iterative reconstruction, and/or weight-based adjustment of the mA/kV was utilized to reduce the radiation dose to as low as reasonably achievable. COMPARISON: 04/25/2020 and ultrasound from 01/19/2022. CLINICAL HISTORY: Constipation, right lower quadrant abdominal pain and right flank pain. FINDINGS: LOWER CHEST: Nonspecific peripheral interstitial accentuation at the lung bases. Mild aortic and mitral valve calcification. Pacer leads noted. Prior median sternotomy. LIVER: Slightly lobulated contour of the liver, cirrhosis not excluded. GALLBLADDER AND BILE DUCTS: Cholecystectomy. No biliary ductal dilatation. SPLEEN: No acute abnormality. PANCREAS: No acute abnormality. ADRENAL GLANDS: 1.4 x 2.1 cm left adrenal myelolipoma on image 34 series 2. KIDNEYS, URETERS AND BLADDER: Mild to moderate right hydronephrosis and hydroureter extending down to a 5 mm in length  mediastinal calculus just below the level of the iliac crests as shown on image 79 series 7. The right ureter distal to this point is of normal caliber. 3 mm in length right kidney lower pole nonobstructive renal calculus. No stones in the left kidney or left ureter. No left hydronephrosis or hydroureter. No perinephric or periureteral stranding. Urinary bladder is unremarkable. GI AND BOWEL: Stomach demonstrates no acute abnormality. Sigmoid colon diverticulosis. Overall stool burden is within normal limits. There is no bowel obstruction. PERITONEUM AND RETROPERITONEUM: No ascites. No free air. VASCULATURE: Aortic and coronary atherosclerosis. Atherosclerosis of the abdominal aorta and its branches including the celiac trunk and proximal superior mesenteric artery demonstrate a substantial atheromatous plaque. Patency not assessed on today's noncontrast examination. LYMPH NODES: No lymphadenopathy. REPRODUCTIVE ORGANS: Fatty left spermatic cord. BONES AND SOFT TISSUES: Right posterolateral 11th rib nonunited fracture. Old healed bilateral lower rib fractures. Prior L1 vertebral augmentation. Chronic bilateral pars defects at L5 with grade 1 anterolisthesis of L5 on S1 and a resulting right foraminal impingement at L5-S1. Prior right inguinal hernia repair with some mild prominence of fatty tissues in the vicinity. No focal soft tissue abnormality. IMPRESSION: 1. Mild to moderate right hydronephrosis and hydroureter secondary to a 5 mm right ureteral calculus just below the level of the iliac crests, with normal caliber of the distal  right ureter. 2. Nonobstructive 3 mm right lower pole renal calculus. 3. Slightly lobulated hepatic contour, cirrhosis not excluded. 4. Sigmoid colon diverticulosis without evidence of diverticulitis. 5. Chronic bilateral pars defects at L5 with grade 1 anterolisthesis of L5 on S1 and resulting right foraminal impingement at L5-S1, with prior L1 vertebral augmentation. 6. Additional  chronic and incidental findings include aortic and coronary atherosclerosis with atherosclerosis of the abdominal aorta and its branches, mild aortic and mitral valve calcification, pacer leads, nonspecific peripheral interstitial accentuation at the lung bases, cholecystectomy, prior right inguinal hernia repair with mild fat prominence in the region and fatty left spermatic cord, prior median sternotomy, chronic nonunited right posterolateral 11th rib fracture, and old healed bilateral lower rib fractures. Electronically signed by: Ryan Salvage MD 05/12/2024 04:01 PM EST RP Workstation: [id number]   CUP PACEART REMOTE DEVICE CHECK Result Date: 05/09/2024 PPM Scheduled remote reviewed. Normal device function.  Presenting rhythm:  AS-VP, occasional AP. Battery estimated 2.6 months remaining longevity, on IFU protocol. Next remote transmission per protocol. - CS, CVRS    Time coordinating discharge: Over 30 minutes    Alm Apo, MD  [facility] 05/16/2024, 3:48 PM    [1]  Allergies Allergen Reactions   Zoledronic  Acid Other (See Comments)    Discontinued in 2015 due to progressive decrease in renal function (CKD-III/IV)   Nsaids Other (See Comments)    Held due to creatinine elevation.     Lokelma  [Sodium Zirconium Cyclosilicate ] Nausea Only    Itching; tolerated doses well Dec 2025   "

## 2024-05-16 NOTE — Evaluation (Signed)
 Occupational Therapy Evaluation Patient Details Name: Dean Murphy MRN: [id number] DOB: April 22, 1927 Today's Date: 05/16/2024   History of Present Illness   Dean Murphy is a 89 yo male who presented 05/12/24 with right flank pain and constipation. CT A/P which showed mild to moderate right hydronephrosis and hydroureter due to 5 mm right ureteral calculus .S/P Cystoscopy, Right retrograde pyelogram with interpretation, Right ureteral stent placement.  PMH CKD4, HFpEF, CHB s/p PPM, PAF not on DOAC, constipation, CVA, CAD s/p CABG, DM II, chronic thrombocytopenia, anemia, PVD, osteoporosis, DDD     Clinical Impressions PTA, patient lives at home with wife who has dementia and was independent. Has local son and DIL present for session. Currently, patient presents with deficits outlined below (see OT Problem List for details) most significantly baseline L knee deficits, new Foley catheter, decreased balance and activity tolerance limiting BADL's and functional mobility. OT issued and trained in sock aide, initiated trng for LB dressing with new Foley catheter if maintained post d/c as well as training with rollator safety. ECT Handout issued and introduced to 5 P's. Patient and family in agreement with rollator recommendation as well as HHOT and bathing aide with family support and assist upon discharge. TOC and MD notified. Patient requires continued Acute care hospital level OT services to progress safety and functional performance and allow for discharge.       If plan is discharge home, recommend the following:   A little help with walking and/or transfers;A little help with bathing/dressing/bathroom;Assistance with cooking/housework;Assist for transportation;Help with stairs or ramp for entrance     Functional Status Assessment   Patient has had a recent decline in their functional status and demonstrates the ability to make significant improvements in function in a reasonable and predictable  amount of time.     Equipment Recommendations   Other (comment) (rollator)      Precautions/Restrictions   Precautions Precautions: Fall Restrictions Weight Bearing Restrictions Per Provider Order: No     Mobility Bed Mobility Overal bed mobility: Needs Assistance Bed Mobility: Rolling, Sidelying to Sit Rolling: Supervision Sidelying to sit: Contact guard assist       General bed mobility comments: min cues for Foley mngt    Transfers Overall transfer level: Needs assistance Equipment used: Rollator (4 wheels) Transfers: Sit to/from Stand, Bed to chair/wheelchair/BSC Sit to Stand: Supervision     Step pivot transfers: Supervision     General transfer comment: cues for hand placement and rollator brakes and device mngt due to novel use      Balance Overall balance assessment: Needs assistance Sitting-balance support: Feet supported, No upper extremity supported Sitting balance-Leahy Scale: Good     Standing balance support: Reliant on assistive device for balance, During functional activity Standing balance-Leahy Scale: Fair                             ADL either performed or assessed with clinical judgement   ADL Overall ADL's : Needs assistance/impaired Eating/Feeding: Independent   Grooming: Wash/dry hands;Wash/dry face;Sitting;Modified independent   Upper Body Bathing: Set up;Sitting   Lower Body Bathing: Minimal assistance;Sit to/from stand   Upper Body Dressing : Set up;Sitting   Lower Body Dressing: Minimal assistance;Sit to/from stand Lower Body Dressing Details (indicate cue type and reason): issued and trained in sock aide and initiated Foley mngt instruction Toilet Transfer: Contact guard assist;Rollator (4 wheels)   Toileting- Clothing Manipulation and Hygiene: Minimal assistance;Sitting/lateral lean Toileting -  Clothing Manipulation Details (indicate cue type and reason): educated on use of urinal to empty Foley bag      Functional mobility during ADLs: Contact guard assist;Rollator (4 wheels) General ADL Comments: decreased L LE reach     Vision Baseline Vision/History: 0 No visual deficits;1 Wears glasses              Pertinent Vitals/Pain Pain Assessment Pain Assessment: No/denies pain     Extremity/Trunk Assessment Upper Extremity Assessment Upper Extremity Assessment: Overall WFL for tasks assessed;Right hand dominant (baseline Dupetrene's contractures Bly)   Lower Extremity Assessment Lower Extremity Assessment: Defer to PT evaluation   Cervical / Trunk Assessment Cervical / Trunk Assessment: Kyphotic   Communication Communication Communication: Impaired Factors Affecting Communication: Hearing impaired   Cognition Arousal: Alert Behavior During Therapy: WFL for tasks assessed/performed Cognition: No apparent impairments                               Following commands: Intact       Cueing  General Comments   Cueing Techniques: Verbal cues  L UE edema nursing aware and stopped IV, OT educated on elevation           Home Living Family/patient expects to be discharged to:: Private residence Living Arrangements: Spouse/significant other Available Help at Discharge: Family Type of Home: House Home Access: Stairs to enter Secretary/administrator of Steps: 3 Entrance Stairs-Rails: Left Home Layout: One level     Bathroom Shower/Tub: Tub/shower unit;Curtain   Bathroom Toilet: Handicapped height     Home Equipment: Agricultural Consultant (2 wheels);Cane - single point;Tub bench;Hand held shower head;Grab bars - tub/shower;Adaptive equipment Adaptive Equipment: Reacher Additional Comments: wife has dementia but can care for self      Prior Functioning/Environment Prior Level of Function : Independent/Modified Independent;Driving             Mobility Comments: uses Rw in home and cane outside, states he wants to use RW outside now ADLs Comments: indep  with B/D, meals    OT Problem List: Decreased activity tolerance;Impaired balance (sitting and/or standing);Increased edema   OT Treatment/Interventions: Self-care/ADL training;Therapeutic exercise;Neuromuscular education;Energy conservation;DME and/or AE instruction;Therapeutic activities;Patient/family education;Balance training      OT Goals(Current goals can be found in the care plan section)   Acute Rehab OT Goals Patient Stated Goal: to go home OT Goal Formulation: With patient/family Time For Goal Achievement: 05/30/24 Potential to Achieve Goals: Good ADL Goals Pt Will Perform Lower Body Bathing: with supervision;sit to/from stand;with adaptive equipment Pt Will Perform Lower Body Dressing: with supervision;with adaptive equipment;sit to/from stand Pt Will Transfer to Toilet: with supervision;ambulating;regular height toilet Pt Will Perform Toileting - Clothing Manipulation and hygiene: with min assist;sit to/from stand Pt Will Perform Tub/Shower Transfer: Shower transfer;ambulating;shower seat Additional ADL Goal #1: Patient will teach back 4/5 ECT principles with min cues for ADL's and mobility   OT Frequency:  Min 2X/week       AM-PAC OT 6 Clicks Daily Activity     Outcome Measure Help from another person eating meals?: None Help from another person taking care of personal grooming?: A Little Help from another person toileting, which includes using toliet, bedpan, or urinal?: A Lot Help from another person bathing (including washing, rinsing, drying)?: A Little Help from another person to put on and taking off regular upper body clothing?: A Little Help from another person to put on and taking off regular lower body clothing?:  A Lot 6 Click Score: 17   End of Session Equipment Utilized During Treatment: Rollator (4 wheels);Gait belt Nurse Communication: Mobility status  Activity Tolerance: Patient tolerated treatment well Patient left: with nursing/sitter in room  (handoff to NT)  OT Visit Diagnosis: Unsteadiness on feet (R26.81)                Time: 1100-1155 OT Time Calculation (min): 55 min Charges:  OT General Charges $OT Visit: 1 Visit OT Evaluation $OT Eval Low Complexity: 1 Low OT Treatments $Self Care/Home Management : 8-22 mins $Therapeutic Activity: 8-22 mins  Dorothe Elmore OT/L Acute Rehabilitation Department  [phone]  05/16/2024, 1:12 PM

## 2024-05-17 ENCOUNTER — Other Ambulatory Visit: Payer: Self-pay | Admitting: Family Medicine

## 2024-05-17 ENCOUNTER — Telehealth: Payer: Self-pay

## 2024-05-17 DIAGNOSIS — E119 Type 2 diabetes mellitus without complications: Secondary | ICD-10-CM

## 2024-05-17 NOTE — Patient Instructions (Signed)
 Visit Information  Thank you for taking time to visit with me today. Please don't hesitate to contact me if I can be of assistance to you.  Patient instructions:  monitor urie for color, clarity and odor, report fever or burning upon urination to provider.   stay hydrated 2-3 L unless contraindicated.   monitor for any severe flank pain, decreased urine output or nausea/ vomiting. Advised to report this to his provider immediately or seek emergency medical services.  Take medications as prescribed Call and schedule hospital follow up visit with primary care provider office\ Call [facility] home health if you do not hear from them.    Patient verbalizes understanding of instructions and care plan provided today and agrees to view in MyChart. Active MyChart status and patient understanding of how to access instructions and care plan via MyChart confirmed with patient.     The patient has been provided with contact information for the care management team and has been advised to call with any health related questions or concerns.   Please call the care guide team at [phone] if you need to cancel or reschedule your appointment.   Please call the Suicide and Crisis Lifeline: 988 call the [place]  National Suicide Prevention Lifeline: 8250285202 or TTY: 504-510-1467 TTY 909 368 9742) to talk to a trained counselor call 1-800-273-TALK (toll free, 24 hour hotline) if you are experiencing a Mental Health or Behavioral Health Crisis or need someone to talk to.  Arvin Seip RN, BSN, CCM [employer], Population Health Case Manager Phone: [phone]

## 2024-05-17 NOTE — Transitions of Care (Post Inpatient/ED Visit) (Signed)
 "  05/17/2024  Name: Dean Murphy MRN: [id number] DOB: [birth year]  Today's TOC FU Call Status: Today's TOC FU Call Status:: Successful TOC FU Call Completed TOC FU Call Complete Date: 05/17/24  Patient's Name and Date of Birth confirmed. Name, DOB  Transition Care Management Follow-up Telephone Call Date of Discharge: 05/16/24 Discharge Facility: [facility]) Type of Discharge: Inpatient Admission Primary Inpatient Discharge Diagnosis:: Hydronephrosis with urinary obstruction due to ureteral calculus How have you been since you were released from the hospital?: Better Any questions or concerns?: No  Items Reviewed: Did you receive and understand the discharge instructions provided?: Yes Medications obtained,verified, and reconciled?: Yes (Medications Reviewed) Any new allergies since your discharge?: No Dietary orders reviewed?: Yes Type of Diet Ordered:: carb modified Do you have support at home?: Yes People in Home [RPT]: spouse Name of Support/Comfort Primary Source: Avelina Grand  Medications Reviewed Today: Medications Reviewed Today     Reviewed by Tor Tsuda E, RN (Registered Nurse) on 05/17/24 at 0919  Med List Status: <None>   Medication Order Taking? Sig Documenting Provider Last Dose Status Informant  amLODipine  (NORVASC ) 10 MG tablet [id number] Yes Take 10 mg by mouth daily. [provider]  Active Self, Spouse/Significant Other, Pharmacy Records  amoxicillin  (AMOXIL ) 500 MG capsule [id number] Yes Take 1 capsule (500 mg total) by mouth 2 (two) times daily. Cleatus Arlyss RAMAN, MD  Active Self, Spouse/Significant Other, Pharmacy Records  B-D UF III MINI PEN NEEDLES 31G X 5 MM MISC [id number] Yes USE DAILY WITH INSULIN  PEN Cleatus Arlyss RAMAN, MD  Active Self, Spouse/Significant Other, Pharmacy Records  cholecalciferol  (VITAMIN D3) 25 MCG (1000 UNIT) tablet [id number] Yes Take 1,000 Units by mouth daily. [provider]  Active Spouse/Significant Other,  Self, Pharmacy Records  clotrimazole -betamethasone  (LOTRISONE ) cream [id number] Yes APPLY TOPICALLY 2 TIMES DAILY AS NEEDED.  Patient taking differently: Apply 1 Application topically 2 (two) times daily as needed (itching).   Cleatus Arlyss RAMAN, MD  Active Self, Spouse/Significant Other, Pharmacy Records  cyanocobalamin  (VITAMIN B12) 1000 MCG tablet [id number] Yes Take 1 tablet (1,000 mcg total) by mouth daily. Cleatus Arlyss RAMAN, MD  Active Self, Spouse/Significant Other, Pharmacy Records  dorzolamide -timolol  (COSOPT ) 22.3-6.8 MG/ML ophthalmic solution [id number] Yes Place 1 drop into both eyes daily in the afternoon. In both eye  Patient taking differently: Place 1 drop into both eyes daily in the afternoon. In both eye 2 x per day   [provider]  Active Spouse/Significant Other, Self, Pharmacy Records  insulin  glargine (LANTUS  SOLOSTAR) 100 UNIT/ML Solostar Pen [id number]  INJECT 46 UNITS INTO THE SKIN DAILY.  Patient taking differently: 38 Units at bedtime.   Cleatus Arlyss RAMAN, MD  Active Self, Spouse/Significant Other, Pharmacy Records  Iron , Ferrous Sulfate , 325 (65 Fe) MG TABS [id number] Yes Take 325 mg by mouth 3 (three) times a week. Cleatus Arlyss RAMAN, MD  Active Self, Spouse/Significant Other, Pharmacy Records  metoprolol  succinate (TOPROL  XL) 25 MG 24 hr tablet [id number] Yes Take 1 tablet (25 mg total) by mouth at bedtime. Croitoru, Mihai, MD  Active Self, Spouse/Significant Other, Pharmacy Records  Multiple Vitamins-Minerals (PRESERVISION AREDS 2) CAPS [id number] Yes Take 1 capsule by mouth daily. [provider]  Active Self, Spouse/Significant Other, Pharmacy Records  pantoprazole  (PROTONIX ) 40 MG tablet [id number] Yes TAKE 1 TABLET BY MOUTH TWICE A DAY Cleatus Arlyss RAMAN, MD  Active Self, Spouse/Significant Other, Pharmacy Records  rosuvastatin  (CRESTOR ) 20 MG tablet [id number] Yes Take 1 tablet (20 mg total) by mouth daily.  Croitoru, Mihai, MD  Active Self, Spouse/Significant  Other, Pharmacy Records  torsemide  (DEMADEX ) 10 MG tablet [id number]  Take 1 tablet (10 mg total) by mouth 2 (two) times a week. Monday and Friday  Patient taking differently: Take 10 mg by mouth 3 (three) times a week. Monday Wed and Friday   Cleatus Arlyss RAMAN, MD  Active Spouse/Significant Other, Self, Pharmacy Records  vitamin C  (ASCORBIC ACID ) 250 MG tablet [id number] Yes Take 250 mg by mouth 3 (three) times a week. M-W-F [provider]  Active Self, Spouse/Significant Other, Pharmacy Records            Home Care and Equipment/Supplies: Were Home Health Services Ordered?: Yes Name of Home Health Agency:: [facility] Any new equipment or medical supplies ordered?: Yes Name of Medical supply agency?: [employer] [employer] Were you able to get the equipment/medical supplies?: Yes Do you have any questions related to the use of the equipment/supplies?: No  Functional Questionnaire: Do you need assistance with bathing/showering or dressing?: No Do you need assistance with meal preparation?: No Do you need assistance with eating?: No Do you have difficulty maintaining continence: No Do you need assistance with getting out of bed/getting out of a chair/moving?: No Do you have difficulty managing or taking your medications?: No  Follow up appointments reviewed: PCP Follow-up appointment confirmed?: No (patient states he will call and schedule hospital follow up visit with his primary care provider.) Specialist Hospital Follow-up appointment confirmed?: Yes Date of Specialist follow-up appointment?: 06/01/24 Follow-Up Specialty Provider:: urologist. Do you need transportation to your follow-up appointment?: No Do you understand care options if your condition(s) worsen?: Yes-patient verbalized understanding  SDOH Interventions Today    Flowsheet Row Most Recent Value  SDOH Interventions   Food Insecurity Interventions Intervention Not Indicated  Housing Interventions  Intervention Not Indicated  Transportation Interventions Intervention Not Indicated  Utilities Interventions Intervention Not Indicated   Discussed and offered 30 day TOC program.  Patient  declined. The patient has been provided with contact information for the care management team and has been advised to call with any health -related questions or concerns.  The patient verbalized understanding with current plan of care.  The patient is directed to their insurance card regarding availability of benefits coverage.    Arvin Seip RN, BSN, CCM [employer], Population Health Case Manager Phone: [phone]  "

## 2024-05-18 ENCOUNTER — Encounter: Payer: Self-pay | Admitting: Family Medicine

## 2024-05-19 NOTE — Telephone Encounter (Signed)
 Please schedule hosp f/u office visit next week with available provider as don't recommend waiting until pcp is back in office.   When we call to schedule TCM OV, plz ensure he has urology f/u scheduled as well.

## 2024-05-23 ENCOUNTER — Ambulatory Visit: Payer: Self-pay | Admitting: Family Medicine

## 2024-05-23 ENCOUNTER — Other Ambulatory Visit: Payer: Self-pay | Admitting: Urology

## 2024-05-23 ENCOUNTER — Ambulatory Visit: Admitting: Family Medicine

## 2024-05-23 ENCOUNTER — Telehealth: Payer: Self-pay | Admitting: Cardiovascular Disease

## 2024-05-23 ENCOUNTER — Encounter: Payer: Self-pay | Admitting: Family Medicine

## 2024-05-23 VITALS — BP 155/61 | HR 64 | Temp 97.6°F | Ht 71.0 in | Wt 193.2 lb

## 2024-05-23 DIAGNOSIS — M5416 Radiculopathy, lumbar region: Secondary | ICD-10-CM

## 2024-05-23 DIAGNOSIS — N183 Chronic kidney disease, stage 3 unspecified: Secondary | ICD-10-CM

## 2024-05-23 DIAGNOSIS — D649 Anemia, unspecified: Secondary | ICD-10-CM

## 2024-05-23 DIAGNOSIS — R5381 Other malaise: Secondary | ICD-10-CM | POA: Diagnosis not present

## 2024-05-23 DIAGNOSIS — D696 Thrombocytopenia, unspecified: Secondary | ICD-10-CM

## 2024-05-23 DIAGNOSIS — R7989 Other specified abnormal findings of blood chemistry: Secondary | ICD-10-CM | POA: Diagnosis not present

## 2024-05-23 DIAGNOSIS — G8929 Other chronic pain: Secondary | ICD-10-CM | POA: Diagnosis not present

## 2024-05-23 DIAGNOSIS — I5032 Chronic diastolic (congestive) heart failure: Secondary | ICD-10-CM

## 2024-05-23 DIAGNOSIS — N179 Acute kidney failure, unspecified: Secondary | ICD-10-CM | POA: Diagnosis not present

## 2024-05-23 DIAGNOSIS — I10 Essential (primary) hypertension: Secondary | ICD-10-CM

## 2024-05-23 DIAGNOSIS — N184 Chronic kidney disease, stage 4 (severe): Secondary | ICD-10-CM | POA: Diagnosis not present

## 2024-05-23 DIAGNOSIS — N132 Hydronephrosis with renal and ureteral calculous obstruction: Secondary | ICD-10-CM | POA: Diagnosis not present

## 2024-05-23 DIAGNOSIS — E875 Hyperkalemia: Secondary | ICD-10-CM

## 2024-05-23 LAB — CBC WITH DIFFERENTIAL/PLATELET
Basophils Absolute: 0.1 K/uL (ref 0.0–0.1)
Basophils Relative: 0.7 % (ref 0.0–3.0)
Eosinophils Absolute: 0.2 K/uL (ref 0.0–0.7)
Eosinophils Relative: 2.5 % (ref 0.0–5.0)
HCT: 30.9 % — ABNORMAL LOW (ref 39.0–52.0)
Hemoglobin: 10.1 g/dL — ABNORMAL LOW (ref 13.0–17.0)
Lymphocytes Relative: 12.8 % (ref 12.0–46.0)
Lymphs Abs: 1 K/uL (ref 0.7–4.0)
MCHC: 32.7 g/dL (ref 30.0–36.0)
MCV: 90.2 fl (ref 78.0–100.0)
Monocytes Absolute: 0.4 K/uL (ref 0.1–1.0)
Monocytes Relative: 5.1 % (ref 3.0–12.0)
Neutro Abs: 5.9 K/uL (ref 1.4–7.7)
Neutrophils Relative %: 78.9 % — ABNORMAL HIGH (ref 43.0–77.0)
Platelets: 115 K/uL — ABNORMAL LOW (ref 150.0–400.0)
RBC: 3.43 Mil/uL — ABNORMAL LOW (ref 4.22–5.81)
RDW: 14.9 % (ref 11.5–15.5)
WBC: 7.5 K/uL (ref 4.0–10.5)

## 2024-05-23 LAB — BASIC METABOLIC PANEL WITH GFR
BUN: 42 mg/dL — ABNORMAL HIGH (ref 6–23)
CO2: 25 meq/L (ref 19–32)
Calcium: 9.3 mg/dL (ref 8.4–10.5)
Chloride: 116 meq/L — ABNORMAL HIGH (ref 96–112)
Creatinine, Ser: 2.79 mg/dL — ABNORMAL HIGH (ref 0.40–1.50)
GFR: 18.72 mL/min — ABNORMAL LOW
Glucose, Bld: 180 mg/dL — ABNORMAL HIGH (ref 70–99)
Potassium: 5.6 meq/L — ABNORMAL HIGH (ref 3.5–5.1)
Sodium: 148 meq/L — ABNORMAL HIGH (ref 135–145)

## 2024-05-23 LAB — HEPATIC FUNCTION PANEL
ALT: 27 U/L (ref 3–53)
AST: 30 U/L (ref 5–37)
Albumin: 3.4 g/dL — ABNORMAL LOW (ref 3.5–5.2)
Alkaline Phosphatase: 274 U/L — ABNORMAL HIGH (ref 39–117)
Bilirubin, Direct: 0.1 mg/dL (ref 0.1–0.3)
Total Bilirubin: 0.5 mg/dL (ref 0.2–1.2)
Total Protein: 6.4 g/dL (ref 6.0–8.3)

## 2024-05-23 NOTE — Assessment & Plan Note (Signed)
 S/p stent  Reviewed hospital records, lab results and studies in detail  Clinically improved Has urology appointment upcoming  Sees nephrology as well   Lab pending   Today's back pain is chronic-pt says not from the stone

## 2024-05-23 NOTE — Progress Notes (Signed)
 "  Subjective:    Patient ID: Dean Murphy, male    DOB: January 27, 1927, 89 y.o.   MRN: [id number]  HPI  Wt Readings from Last 3 Encounters:  05/23/24 193 lb 4 oz (87.7 kg)  05/12/24 181 lb 14.1 oz (82.5 kg)  05/01/24 181 lb 12.8 oz (82.5 kg)   26.95 kg/m  Vitals:   05/23/24 1004 05/23/24 1019  BP: (!) 162/58 (!) 155/61  Pulse:    Temp:    SpO2:       Pt presents for hospital follow up after renal stone and AKI Hosp from 12/19 to 05/16/24 Discharged to home with [facility] PT  Back is hurting today -thinks unrelated to his stone     From d/c summary:  Assessment and Plan: * Hydronephrosis with urinary obstruction due to ureteral calculus - per CT: mild to moderate right hydronephrosis and hydroureter due to 5 mm right ureteral calculus.  Nonobstructive 3 mm right lower pole renal calculus also noted. - s/p cystoscopy with right ureteral stent placement with urology on 05/12/2024 -No signs of sepsis on admission.  UA negative for signs of infection as well on admission - Empirically on amoxicillin  post procedure -Further management of stent per urology outpatient - Foley 05/16/2024 prior to discharge given improvement in renal function.  Also discussed with urology prior to removal   Acute kidney injury superimposed on stage 4 chronic kidney disease (HCC) - patient has history of CKD4. Baseline creat ~ 2.3 - 2.4, eGFR~ 20-25 - patient presents with increase in creat >0.3 mg/dL above baseline or creat increase >1.5x baseline presumed to have occurred within past 7 days PTA - presumed BOO from ureteral calculus; was also still taking torsemide  at home PTA - Creatinine 4.34 on admission and hyperkalemia with 6.4 potassium - Has now undergone ureteral stent placement to relieve obstruction -Renal function has steadily improved.  Catheter removed prior to discharge -Repeat BMP at follow-up   Hyperkalemia-resolved as of 05/16/2024 - In setting of worsened renal function -Treated with  calcium  gluconate and insulin  on admission but no treatment with medications to help excrete -Still elevated this morning; discussed his allergy of Kayexalate .  Daughter-in-law at bedside states typically has caused diarrhea in the past which is not unexpected.  Reaction in epic is noted to be itching.  Regardless, he is amenable with treatment at this time -Kayexalate  ordered 12/20 along with repeat doses of calcium  gluconate and insulin /D50 -Unable to give diuresis in setting of worsened renal function - Responded well to Lokelma  doses - Potassium normalized, 4.7 on discharge   Physical deconditioning - Given prolonged hospitalization and advanced age, will ask for PT evaluation prior to discharge also.  He lives with wife who has dementia so he is her caretaker -Appreciate PT evaluation, home health PT recommended   Chronic diastolic heart failure (HCC) - No signs/symptoms of exacerbation - Tolerated fluids -Torsemide  held until follow-up with primary care and repeat BMP prior to resumption   Thrombocytopenia - Chronic and no signs of bleeding at this time - Baseline around 90-100 K   Hyperlipidemia associated with type 2 diabetes mellitus (HCC) - Continue Crestor    Coronary artery disease - On statin and Toprol  at home   History of CVA (cerebrovascular accident) - Continue Crestor    Status post placement of cardiac pacemaker - Noted   Normocytic anemia - Due to anemia of chronic disease - Baseline hemoglobin around 9 to 11 g/dL.  Currently at baseline   Paroxysmal A-fib (HCC) -  Not on anticoagulation - On Toprol  at home   Complete heart block (HCC) - Pacemaker in place - Continue Toprol    GERD - Continue Protonix    Essential hypertension - Continue amlodipine  and Toprol    DM (diabetes mellitus), type 2 with renal complications (HCC) - d/c basal insulin  especially in setting of worsened renal failure - SSI only for now -A1c 7.6% on 05/13/2024      Most  recent labs Lab Results  Component Value Date   NA 144 05/16/2024   K 4.7 05/16/2024   CO2 19 (L) 05/16/2024   GLUCOSE 161 (H) 05/16/2024   BUN 38 (H) 05/16/2024   CREATININE 2.53 (H) 05/16/2024   CALCIUM  8.8 (L) 05/16/2024   GFR 21.57 (L) 09/14/2023   EGFR 22 (L) 05/11/2023   GFRNONAA 23 (L) 05/16/2024   Lab Results  Component Value Date   WBC 3.6 (L) 05/14/2024   HGB 9.1 (L) 05/14/2024   HCT 28.4 (L) 05/14/2024   MCV 93.1 05/14/2024   PLT 78 (L) 05/14/2024   Anemia of chronic dz  Per chart thrombocytopenia  Baseline plt 90-100K  Lab Results  Component Value Date   ALT 49 (H) 05/12/2024   AST 43 (H) 05/12/2024   ALKPHOS 307 (H) 05/12/2024   BILITOT 0.4 05/12/2024     Holding torsemide   May have taken out of pill box   Urology follow up - is the [facility] Dr Dennise  Blood sugar is ok  Appetite is not back yet   No pain to urinate  Sees a little blood (very little) in urine- much improved    Patient Active Problem List   Diagnosis Date Noted   Acute kidney injury superimposed on stage 4 chronic kidney disease (HCC) 05/12/2024   Ureteral calculus, right 05/12/2024   Hydronephrosis with urinary obstruction due to ureteral calculus 05/12/2024   Thrombocytopenia 05/12/2024   Hyperlipidemia associated with type 2 diabetes mellitus (HCC) 01/24/2024   Other social stressor 09/15/2023   Paresthesia 06/18/2023   Bowel incontinence 08/23/2022   AVM (arteriovenous malformation) of colon, acquired 08/10/2022   Melena 08/05/2022   Elevated LFTs    Inguinal hernia 01/26/2022   Alkaline phosphatase elevation 12/21/2021   Compression fracture of L1 lumbar vertebra (HCC) 12/21/2021   Chronic diastolic heart failure (HCC) 08/05/2020   Healthcare maintenance 07/21/2018   Advance care planning 07/21/2018   Constipation    Low back pain 02/02/2018   Infection of prosthetic left knee joint 07/29/2015   Coronary artery disease 07/29/2015   History of CVA  (cerebrovascular accident) 12/24/2014   Status post placement of cardiac pacemaker 11/16/2014   Normocytic anemia 09/27/2014   Gastrointestinal hemorrhage associated with peptic ulcer 09/07/2014   Paroxysmal A-fib (HCC) 08/13/2014   Complete heart block (HCC) 08/11/2014   S/P CABG x 5 1989. Low risk Myoview  2012 08/11/2014   Syncope 08/11/2014   Left foot pain 02/23/2014   Gouty arthropathy 08/09/2013   Chronic radicular lumbar pain 12/15/2012   Insomnia 09/30/2011   DM (diabetes mellitus), type 2 with renal complications (HCC) 10/01/2009   Physical deconditioning 09/16/2009   Chronic kidney disease, stage III (moderate) (HCC) 07/26/2008   CHRONIC PROSTATITIS 07/26/2008   Dyslipidemia 11/15/2006   Essential hypertension 11/15/2006   GERD 11/15/2006   Osteoarthritis 11/15/2006   Past Medical History:  Diagnosis Date   (HFpEF) heart failure with preserved ejection fraction (HCC) 07/2014   a.) initially Dx'd in 07/2014 in setting of CHB; b.) TTE 09/02/2017: EF 55-60%,  apical septal HK, G1DD, sev LAE, mild MR, PASP 41; c.) TTE 12/25/2022: EF 60-65%, mild LVH, G1DD, mild AoV sclerosis with no stenosis   Anemia due to chronic blood loss    Aortic atherosclerosis    Arthritis    AVM (arteriovenous malformation) of colon    a.)colonoscopy 08/10/2022: angioectasias (AVM) x 2 --> Tx'd with APC   Bilateral carotid artery disease    a.) s/p BILATERAL carotid endarterectomies: RIGHT (12/31/2010) and LEFT (02/09/2011); b.) carotid doppler 05/13/2023: 1-39% BICA with disturbed flow from the RIGHT subclavian.   Blindness of right eye    CAD (coronary artery disease)    a.) s/p 5v CABG 1989; b.) MV 01/02/2011: EF 75%, no ischemia   Cerebral microvascular disease    CHB (complete heart block) (HCC) 07/2014   a.) s/p St. Jude Assurity dual chamber PPM 08/13/2014   Chronic BILATERAL L5 pars interarticularis defect    Chronic kidney disease, stage IV (severe) (HCC) 2015   Chronic prostatitis     Chronic radicular lumbar pain    Colon polyps    Complication of anesthesia    a.) PONV   Constipation    DDD (degenerative disc disease), thoracolumbar    Diverticulosis    DOE (dyspnea on exertion)    Dupuytren's disease of finger and palm of right hand    Essential hypertension    GERD (gastroesophageal reflux disease)    Gout    Hemorrhoids    Hiatal hernia    History of bilateral cataract extraction    HTN (hypertension)    Hyperlipidemia    Insomnia    Lacunar infarction (CVA) 01/01/2011   a,) noted on MRI brain 01/01/2011: remote lacunar infarcts of LEFT cerebellum and basal ganglia BILATERALLY   Myelolipoma of left adrenal gland    Myocardial infarction (HCC) 1976   a.) type/details unknown   NSTEMI (non-ST elevated myocardial infarction) (HCC) 08/11/2014   a.) troponins were trended: 0.35 --> 0.54 --> 0.54 ng/mL   Orthostasis    Osteoporosis    PAF (paroxysmal atrial fibrillation) (HCC) 07/2014   a.) CHA2DS2-VASc = 8 (age x2, sex, HFpEF, HTN, CVA x2, vascular disease, T2DM) as of 07/01/2023; b.) cardiac rate/rhythm maintained on oral metoprolol  succinate; no OAC   PONV (postoperative nausea and vomiting)    Positive ANA (antinuclear antibody)    a.) workup with rheumatology (-) for SLE   Presence of permanent cardiac pacemaker 08/13/2014   a.) s/p St. Jude Assurity dual chamber PPM 08/13/2014 to LEFT infraclavicular deltopectoral region   PVD (peripheral vascular disease)    Recurrent falls    Right inguinal hernia    S/P CABG x 5 1989   Scoliosis    Symptomatic bradycardia    a.) s/p St. Jude Assurity dual chamber PPM 08/13/2014   Thoracic compression fracture (L1-L2)    a.) s/p L1 balloon kyphoplasty 08/13/2022   Thrombocytopenia    TMJ (dislocation of temporomandibular joint)    Type 2 diabetes mellitus treated with insulin  [facility])    Past Surgical History:  Procedure Laterality Date   CARDIAC CATHETERIZATION  08/11/2014   Procedure: TEMPORARY PACEMAKER;   Surgeon: Dorn JINNY Lesches, MD;  Location: [facility] CATH LAB;  Service: Cardiovascular;;   CAROTID ENDARTERECTOMY Right 12/31/2010   CAROTID ENDARTERECTOMY Left 02/09/2011   CATARACT EXTRACTION W/ INTRAOCULAR LENS IMPLANT Bilateral    CHOLECYSTECTOMY     COLONOSCOPY WITH PROPOFOL  N/A 08/10/2022   Procedure: COLONOSCOPY WITH PROPOFOL ;  Surgeon: Shila Gustav GAILS, MD;  Location: [facility] ENDOSCOPY;  Service: Gastroenterology;  Laterality: N/A;   CORONARY ARTERY BYPASS GRAFT  05/26/1987   x 5 vessels   CYSTOSCOPY WITH STENT PLACEMENT Right 05/12/2024   Procedure: CYSTOSCOPY, WITH STENT INSERTION, RETROGRADE;  Surgeon: Selma Donnice SAUNDERS, MD;  Location: [facility] ORS;  Service: Urology;  Laterality: Right;   ENDOSCOPIC RETROGRADE CHOLANGIOPANCREATOGRAPHY (ERCP) WITH PROPOFOL  N/A 01/27/2022   Procedure: ENDOSCOPIC RETROGRADE CHOLANGIOPANCREATOGRAPHY (ERCP) WITH PROPOFOL ;  Surgeon: Aneita Gwendlyn DASEN, MD;  Location: [facility] ENDOSCOPY;  Service: Gastroenterology;  Laterality: N/A;   ESOPHAGOGASTRODUODENOSCOPY N/A 08/09/2022   Procedure: ESOPHAGOGASTRODUODENOSCOPY (EGD);  Surgeon: Legrand Victory LITTIE DOUGLAS, MD;  Location: [facility] ENDOSCOPY;  Service: Gastroenterology;  Laterality: N/A;   HOT HEMOSTASIS N/A 08/10/2022   Procedure: HOT HEMOSTASIS (ARGON PLASMA COAGULATION/BICAP);  Surgeon: Nandigam, Kavitha V, MD;  Location: [facility] ENDOSCOPY;  Service: Gastroenterology;  Laterality: N/A;   I & D KNEE WITH POLY EXCHANGE Left 07/29/2015   Procedure: IRRIGATION AND DEBRIDEMENT LEFT KNEE WITH POLY EXCHANGE;  Surgeon: Redell Shoals, MD;  Location: [facility] OR;  Service: Orthopedics;  Laterality: Left;   INGUINAL HERNIA REPAIR Right 07/02/2023   Procedure: HERNIA REPAIR INGUINAL ADULT, open, RNFA to assist;  Surgeon: Jordis Laneta FALCON, MD;  Location: [facility] ORS;  Service: General;  Laterality: Right;   IR GENERIC HISTORICAL  03/04/2016   IR US  GUIDE VASC ACCESS RIGHT 03/04/2016 Rome Hall, MD [facility]-INTER[facility]   IR GENERIC HISTORICAL  03/04/2016   IR FLUORO GUIDE CV  LINE RIGHT 03/04/2016 Rome Hall, MD [facility]   IR KYPHO LUMBAR INC FX REDUCE BONE BX UNI/BIL CANNULATION INC/IMAGING  08/13/2022   PERMANENT PACEMAKER INSERTION N/A 08/13/2014   STJ dual chamber pacemaker implanted by Dr Waddell for CHB   REMOVAL OF STONES  01/27/2022   Procedure: REMOVAL OF STONES;  Surgeon: Aneita Gwendlyn DASEN, MD;  Location: [facility];  Service: Gastroenterology;;   ANNETT  01/27/2022   Procedure: ANNETT;  Surgeon: Aneita Gwendlyn DASEN, MD;  Location: [facility];  Service: Gastroenterology;;   TONSILLECTOMY     TOTAL KNEE ARTHROPLASTY Bilateral    Social History[1] Family History  Problem Relation Age of Onset   Heart disease Mother        Before age 63   Diabetes Mother    Varicose Veins Mother    Heart attack Mother    Heart attack Father    Heart disease Father        After age 88   Hypertension Father    Heart disease Brother        Before age 82   Hypertension Brother    Heart attack Brother    Diabetes Son    Hypertension Son    Hypertension Son    Diabetes Son    Diabetes Other    Cancer Other    Colon cancer Neg Hx    Allergies[2] Medications Ordered Prior to Encounter[3]  Review of Systems  Constitutional:  Positive for fatigue. Negative for activity change, appetite change, fever and unexpected weight change.  HENT:  Negative for congestion, rhinorrhea, sore throat and trouble swallowing.   Eyes:  Negative for pain, redness, itching and visual disturbance.  Respiratory:  Negative for cough, chest tightness, shortness of breath and wheezing.   Cardiovascular:  Positive for leg swelling. Negative for chest pain and palpitations.  Gastrointestinal:  Negative for abdominal pain, blood in stool, constipation, diarrhea and nausea.  Endocrine: Negative for cold intolerance, heat intolerance, polydipsia and polyuria.  Genitourinary:  Negative for difficulty urinating, dysuria, frequency and urgency.  Musculoskeletal:  Positive  for back pain. Negative for arthralgias, joint swelling and myalgias.  Skin:  Negative for pallor and rash.  Neurological:  Negative for dizziness, tremors, weakness, numbness and headaches.  Hematological:  Negative for adenopathy. Does not bruise/bleed easily.  Psychiatric/Behavioral:  Negative for decreased concentration and dysphoric mood. The patient is not nervous/anxious.        Objective:   Physical Exam Constitutional:      General: He is not in acute distress.    Appearance: Normal appearance. He is well-developed and normal weight. He is not ill-appearing or diaphoretic.     Comments: Frail appearing but chipper  HENT:     Head: Normocephalic and atraumatic.     Mouth/Throat:     Mouth: Mucous membranes are moist.  Eyes:     General: No scleral icterus.       Right eye: No discharge.        Left eye: No discharge.     Conjunctiva/sclera: Conjunctivae normal.     Pupils: Pupils are equal, round, and reactive to light.  Neck:     Thyroid : No thyromegaly.     Vascular: No carotid bruit or JVD.  Cardiovascular:     Rate and Rhythm: Normal rate.     Heart sounds: Normal heart sounds.     No gallop.  Pulmonary:     Effort: Pulmonary effort is normal. No respiratory distress.     Breath sounds: Normal breath sounds. No stridor. No wheezing, rhonchi or rales.     Comments: No crackles or rales  Abdominal:     General: There is no distension or abdominal bruit.     Palpations: Abdomen is soft.     Tenderness: There is no abdominal tenderness.     Comments: No suprapubic tenderness or fullness    Musculoskeletal:     Cervical back: Normal range of motion and neck supple.     Right lower leg: Edema present.     Left lower leg: Edema present.     Comments: Limited rom LS Pain with ambulation   Lymphadenopathy:     Cervical: No cervical adenopathy.  Skin:    General: Skin is warm and dry.     Coloration: Skin is not jaundiced or pale.     Findings: No bruising or  rash.  Neurological:     Mental Status: He is alert.     Coordination: Coordination normal.     Deep Tendon Reflexes: Reflexes are normal and symmetric. Reflexes normal.  Psychiatric:        Mood and Affect: Mood normal.           Assessment & Plan:   Problem List Items Addressed This Visit       Cardiovascular and Mediastinum   Essential hypertension (Chronic)   Blood pressure is elevated (systolic) in setting of back pain today  Continues  Amlodipine  10 mg daily Metoprolol  xl 25 mg daily        Relevant Orders   Basic metabolic panel with GFR   Hepatic function panel   CBC with Differential/Platelet   Chronic diastolic heart failure (HCC)   Holding spironolactone for AKI currently  Reviewed hospital records, lab results and studies in detail  Weight is up  Not shortness of breath but had edema Labs pending         Genitourinary   Chronic kidney disease, stage III (moderate) (HCC) (Chronic)   Hydronephrosis with urinary obstruction due to ureteral calculus  S/p stent  Reviewed hospital records, lab results and studies in detail  Clinically improved Has urology appointment upcoming  Sees nephrology as well   Lab pending   Today's back pain is chronic-pt says not from the stone      Acute kidney injury superimposed on stage 4 chronic kidney disease (HCC) - Primary   This occurred in setting of hydronephrosis from kidney stone Reviewed hospital records, lab results and studies in detail    Clinical improvement after stent and both urology and nephrology follow up is planned   Lab today  If improved can re start his torsemide        Relevant Orders   Basic metabolic panel with GFR   Hepatic function panel   CBC with Differential/Platelet     Hematopoietic and Hemostatic   Thrombocytopenia   Cbc today  Reviewed hospital records, lab results and studies in detail   At d/c was 80 No new bleeding        Relevant Orders   CBC with  Differential/Platelet     Other   Physical deconditioning   HH has not reached out after hospitalization to start visits for PT  Family will let us  know if they don't hear by end of the week      Normocytic anemia   Cbc today   Due to CKD      Relevant Orders   CBC with Differential/Platelet   Chronic radicular lumbar pain   More discomfort today  Walks with walker No nsaids due to renal dz  Apprehensive about acetaminophen  due to liver changes   Mayneed to address with ortho and pcp         [1]  Social History Tobacco Use   Smoking status: Former    Current packs/day: 0.00    Average packs/day: 1 pack/day for 25.0 years (25.0 ttl pk-yrs)    Types: Cigarettes    Start date: 01/22/1948    Quit date: 01/21/1973    Years since quitting: 51.3    Passive exposure: Past   Smokeless tobacco: Never   Tobacco comments:    began smoking in high school, quit age 8  Vaping Use   Vaping status: Never Used  Substance Use Topics   Alcohol  use: Yes    Comment: wine occassional   Drug use: No  [2]  Allergies Allergen Reactions   Zoledronic  Acid Other (See Comments)    Discontinued in 2015 due to progressive decrease in renal function (CKD-III/IV)   Nsaids Other (See Comments)    Held due to creatinine elevation.     Lokelma  [Sodium Zirconium Cyclosilicate ] Nausea Only    Itching; tolerated doses well Dec 2025  [3]  Current Outpatient Medications on File Prior to Visit  Medication Sig Dispense Refill   amLODipine  (NORVASC ) 10 MG tablet Take 10 mg by mouth daily.     amoxicillin  (AMOXIL ) 500 MG capsule Take 1 capsule (500 mg total) by mouth 2 (two) times daily. 180 capsule 1   cholecalciferol  (VITAMIN D3) 25 MCG (1000 UNIT) tablet Take 1,000 Units by mouth daily.     clotrimazole -betamethasone  (LOTRISONE ) cream APPLY TOPICALLY 2 TIMES DAILY AS NEEDED. (Patient taking differently: Apply 1 Application topically 2 (two) times daily as needed (itching).) 30 g 1    cyanocobalamin  (VITAMIN B12) 1000 MCG tablet Take 1 tablet (1,000 mcg total) by mouth daily.     dorzolamide -timolol  (COSOPT ) 22.3-6.8 MG/ML ophthalmic solution Place 1 drop into both eyes daily in the afternoon. In both  eye (Patient taking differently: Place 1 drop into both eyes daily in the afternoon. In both eye 2 x per day)     insulin  glargine (LANTUS  SOLOSTAR) 100 UNIT/ML Solostar Pen INJECT 46 UNITS INTO THE SKIN DAILY. (Patient taking differently: 38 Units at bedtime.) 45 mL 2   Insulin  Pen Needle (EMBECTA PEN NEEDLE ULTRAFINE) 31G X 5 MM MISC Use as instructed to inject insulin  daily 100 each 4   Iron , Ferrous Sulfate , 325 (65 Fe) MG TABS Take 325 mg by mouth 3 (three) times a week.     metoprolol  succinate (TOPROL  XL) 25 MG 24 hr tablet Take 1 tablet (25 mg total) by mouth at bedtime. 90 tablet 3   Multiple Vitamins-Minerals (PRESERVISION AREDS 2) CAPS Take 1 capsule by mouth daily.     pantoprazole  (PROTONIX ) 40 MG tablet TAKE 1 TABLET BY MOUTH TWICE A DAY 180 tablet 3   rosuvastatin  (CRESTOR ) 20 MG tablet Take 1 tablet (20 mg total) by mouth daily. 90 tablet 3   [Paused] torsemide  (DEMADEX ) 10 MG tablet Take 1 tablet (10 mg total) by mouth 2 (two) times a week. Monday and Friday (Patient taking differently: Take 10 mg by mouth 3 (three) times a week. Monday Wed and Friday)     vitamin C  (ASCORBIC ACID ) 250 MG tablet Take 250 mg by mouth 3 (three) times a week. M-W-F     No current facility-administered medications on file prior to visit.   "

## 2024-05-23 NOTE — Assessment & Plan Note (Signed)
 Blood pressure is elevated (systolic) in setting of back pain today  Continues  Amlodipine  10 mg daily Metoprolol  xl 25 mg daily

## 2024-05-23 NOTE — Assessment & Plan Note (Signed)
 Cbc today  Reviewed hospital records, lab results and studies in detail   At d/c was 80 No new bleeding

## 2024-05-23 NOTE — Assessment & Plan Note (Signed)
 This occurred in setting of hydronephrosis from kidney stone Reviewed hospital records, lab results and studies in detail    Clinical improvement after stent and both urology and nephrology follow up is planned   Lab today  If improved can re start his torsemide 

## 2024-05-23 NOTE — Telephone Encounter (Signed)
 Low risk for cystoscopy.  He is not on any anticoagulants.

## 2024-05-23 NOTE — Assessment & Plan Note (Signed)
 More discomfort today  Walks with walker No nsaids due to renal dz  Apprehensive about acetaminophen  due to liver changes   Mayneed to address with ortho and pcp

## 2024-05-23 NOTE — Assessment & Plan Note (Signed)
 HH has not reached out after hospitalization to start visits for PT  Family will let us  know if they don't hear by end of the week

## 2024-05-23 NOTE — Assessment & Plan Note (Signed)
 Cbc today   Due to CKD

## 2024-05-23 NOTE — Telephone Encounter (Signed)
" ° °  Patient Name: Dean Murphy  DOB: 1926-07-16 MRN: [id number]  Primary Cardiologist: None  Chart reviewed as part of pre-operative protocol coverage. Pre-op clearance already addressed by colleagues in earlier phone notes. To summarize recommendations:  -Low risk for cystoscopy. He is not on any anticoagulant.  -Dr. Francyne  No further cardiovascular testing needed.  Will route this bundled recommendation to requesting provider via Epic fax function and remove from pre-op pool. Please call with questions.  Orren LOISE Fabry, PA-C 05/23/2024, 3:46 PM  "

## 2024-05-23 NOTE — Assessment & Plan Note (Addendum)
 Holding spironolactone for AKI currently  Reviewed hospital records, lab results and studies in detail  Weight is up  Not shortness of breath but had edema Labs pending

## 2024-05-23 NOTE — Assessment & Plan Note (Signed)
 Lab today.

## 2024-05-23 NOTE — Patient Instructions (Signed)
 Labs today for kidney and liver function and blood count    If you don't hear from home health about physical therapy this week - let us  know    Heat and ice are ok for back - 10 minutes at a time   Icy hot or ben gay or salon pas may help back pain    Once labs return we will advise you re: medications including torsemide 

## 2024-05-23 NOTE — Telephone Encounter (Signed)
"  ° °  Pre-operative Risk Assessment    Patient Name: Dean [facility]  DOB: 15-Dec-1926 MRN: [id number]   Date of last office visit: 05/01/24 Date of next office visit: 07/13/24   Request for Surgical Clearance    Procedure:  Cystoscopy Right Retrograde Pyelogram Right Ureteroscopy Laser Lithotripsy Right Stent Exchange  Date of Surgery:  Clearance 06/09/24                                Surgeon:  Dr. Clorinda Siad Surgeon's Group or Practice Name:  [facility] Phone number:  [phone] Fax number:  [phone]   Type of Clearance Requested:   - Medical    Type of Anesthesia:  General    Additional requests/questions:  Please advise surgeon/provider what medications should be held.  Signed, Belisicia ONEIDA Ill   05/23/2024, 11:33 AM   "

## 2024-05-24 ENCOUNTER — Telehealth: Payer: Self-pay | Admitting: Family Medicine

## 2024-05-24 ENCOUNTER — Encounter: Payer: Self-pay | Admitting: Cardiovascular Disease

## 2024-05-24 NOTE — Telephone Encounter (Signed)
 Pt's son Fairy notified of Dr. Lucille cardiologist message. He will keep holding the Torsemide . I advise them I called nephrology myself and let them know the lab results and if he doesn't get a f/u call soon to let us  know.  Son also sent a mychart message he wanted Dr. Randeen to be aware of (mychart message sent to Dr. Randeen) he said she can respond when able.

## 2024-05-24 NOTE — Telephone Encounter (Signed)
 I just read the mychart note-I think he needs to go to the ER now for urgent attention - please take him now  I will watch for correspondence   So sorry he is not doing well

## 2024-05-24 NOTE — Telephone Encounter (Signed)
 Called son Fairy and he said he is in [facility] and asked if we could call pt and tell him to call 911, called pt and he said he didn't fall and doesn't know what he is talking about and he isn't upset and he didn't fall and refused to call 911. I convinced pt to at lest call his son who sent the message and check in with him he said he will call his son but isn't going back to hospital.   FYI to Dr. Randeen

## 2024-05-24 NOTE — Telephone Encounter (Signed)
 Update from cardiology (much appreciated thank you!) as follows from Dr Francyne  Thank you for the update. When I recently saw Dean Murphy he had no evidence whatsoever of hypervolemia. He was only taking a tiny dose of torsemide  intermittently, anyway. The only time he has had overt heart failure is when he had complete heart block and since then has been very well compensated. I think it is fine to continue holding his torsemide , as long as he does not develop shortness of breath.   Please let family know-continue to hold the torsemide     Let them know we sent messages to nephrology-and to have them please call that office for further instructions regarding the low GFR and high potassium- let us  know if they cannot touch base I will continue to watch out for info

## 2024-05-26 NOTE — Progress Notes (Signed)
 Sent message, via epic in basket, requesting orders in epic from Careers adviser.

## 2024-05-26 NOTE — Telephone Encounter (Signed)
 Placed message in the urgent referral chat to let them know urgent referral was placed

## 2024-05-28 ENCOUNTER — Other Ambulatory Visit: Payer: Self-pay | Admitting: Cardiovascular Disease

## 2024-05-28 DIAGNOSIS — I10 Essential (primary) hypertension: Secondary | ICD-10-CM

## 2024-05-28 DIAGNOSIS — E785 Hyperlipidemia, unspecified: Secondary | ICD-10-CM

## 2024-05-28 NOTE — Telephone Encounter (Signed)
 In process of trying to get home care to come out and draw labs for him (as requested from nephrology) Unsure how successful we will be with that -I have not had good luck with urgent home care referrals in the past Complex situation  He is simply too weak to go anywhere and does not want to be in the hospital  His last labs /renal and K worry me

## 2024-05-28 NOTE — Telephone Encounter (Signed)
 Please let me know if patient can't get HH set up or the labs done this week.  Thanks.

## 2024-05-28 NOTE — Telephone Encounter (Signed)
 See notes from OV and labs.

## 2024-05-30 NOTE — Telephone Encounter (Signed)
 Left message to return call to office. Ok to [employer] below.

## 2024-06-01 ENCOUNTER — Encounter: Payer: Self-pay | Admitting: Family Medicine

## 2024-06-01 ENCOUNTER — Encounter: Payer: Self-pay | Admitting: Cardiovascular Disease

## 2024-06-01 NOTE — Progress Notes (Signed)
 PERIOPERATIVE PRESCRIPTION FOR IMPLANTED CARDIAC DEVICE PROGRAMMING  Patient Information: Name:  Dean Murphy  DOB:  [birth year]  MRN:  [id number]  Planned Procedure:  Cysto, laser, retrograde pyelogram, Right stent  Surgeon: Donnice Siad, MD  Date of Procedure: 06-09-24  Cautery will be used.  Position during surgery: Supine  Device Information:  Clinic EP Physician:  Dr. Jerel Croitoru  Device Type:  Pacemaker Manufacturer and Phone #:  St. Jude/Abbott: [phone] Pacemaker Dependent?:  Yes.   Date of Last Device Check:  05/07/2024 Normal Device Function?:  Yes.    Electrophysiologist's Recommendations:  Have magnet available. Provide continuous ECG monitoring when magnet is used or reprogramming is to be performed.  Procedure may interfere with device function.  Magnet should be placed over device during procedure.  Per Device Clinic Standing Orders, Delon DELENA Sharps, RN  3:39 PM 06/01/2024

## 2024-06-01 NOTE — Patient Instructions (Signed)
 SURGICAL WAITING ROOM VISITATION Patients having surgery or a procedure may have no more than 2 support people in the waiting area - these visitors may rotate in the visitor waiting room.   If the patient needs to stay at the hospital during part of their recovery, the visitor guidelines for inpatient rooms apply.  PRE-OP VISITATION  Pre-op nurse will coordinate an appropriate time for 1 support person to accompany the patient in pre-op.  This support person may not rotate.  This visitor will be contacted when the time is appropriate for the visitor to come back in the pre-op area.  To keep our patients, visitors and teammates safe and prevent the spread of respiratory illnesses over the next few months.  Temporary Visitor Restrictions  Children ages 27 and under will not be able to visit patients in [facility] under most circumstances. Visitation is not restricted outside of hospitals unless noted otherwise in the [facility] and Location Specific Visitation Guidelines at:       [email or web address]  Visitors with respiratory illnesses are discouraged from visiting and should remain at home. You are not required to quarantine at this time prior to your surgery. However, you must do this: Hand Hygiene often Do NOT share personal items Notify your provider if you are in close contact with someone who has COVID or you develop fever 100.4 or greater, new onset of sneezing, cough, sore throat, shortness of breath or body aches.  If you test positive for Covid or have been in contact with anyone that has tested positive in the last 10 days please notify you surgeon.    Your procedure is scheduled on:  Friday  06-09-2024  Report to [facility] Main Entrance: [address] entrance where the [employer] is available.   Report to admitting at:  06:30   AM  Call this number if you have any questions or problems the morning of surgery [phone]  DO NOT EAT OR DRINK ANYTHING AFTER  MIDNIGHT THE NIGHT PRIOR TO YOUR SURGERY / PROCEDURE.   FOLLOW  ANY ADDITIONAL PRE OP INSTRUCTIONS YOU RECEIVED FROM YOUR SURGEON'S OFFICE!!!   Oral Hygiene is also important to reduce your risk of infection.        Remember - BRUSH YOUR TEETH THE MORNING OF SURGERY WITH YOUR REGULAR TOOTHPASTE  Do NOT smoke after Midnight the night before surgery.   LANTUS  (Insulin  Glargine) - Night before surgery;  Take 50% or 19 units   STOP TAKING all Vitamins, Herbs and supplements 1 week before your surgery.   Take ONLY these medicines the morning of surgery with A SIP OF WATER : Amlodipine , Pantoprazole , Eye drops  DO NOT TAKE Torsemide  the morning of your surgery.   You may not have any metal on your body including jewelry, and body piercing  Do not wear  lotions, powders,cologne, or deodorant  Men may shave face and neck.  Contacts, Hearing Aids, dentures or bridgework may not be worn into surgery. DENTURES WILL BE REMOVED PRIOR TO SURGERY PLEASE DO NOT APPLY Poly grip OR ADHESIVES!!!  Patients discharged on the day of surgery will not be allowed to drive home.  Someone NEEDS to stay with you for the first 24 hours after anesthesia.  Do not bring your home medications to the hospital. The Pharmacy will dispense medications listed on your medication list to you during your admission in the Hospital.  Special Instructions: Bring a copy of your healthcare power of attorney and living will documents the day  of surgery, if you wish to have them scanned into your [facility] Medical Records- EPIC  Please read over the following fact sheets you were given: IF YOU HAVE QUESTIONS ABOUT YOUR PRE-OP INSTRUCTIONS, PLEASE CALL [phone].   [facility] - Preparing for Surgery Before surgery, you can play an important role.  Because skin is not sterile, your skin needs to be as free of germs as possible.  You can reduce the number of germs on your skin by washing with Antibacterial soap before  surgery.  . Do not shave (including legs and underarms) for at least 48 hours prior to the first shower.  You may shave your face/neck.  Please follow these instructions carefully:  1.  Shower with antibacterial Soap the night before surgery and the  morning of surgery.  2.  If you choose to wash your hair, wash your hair first as usual with your normal  shampoo.  3.  After you shampoo, rinse your hair and body thoroughly to remove the shampoo.                             4.  You can apply soap directly to the skin and wash.  Gently with a scrungie or clean washcloth.  5.  Wash face,  Genitals (private parts) with your normal soap.             6.  Wash thoroughly, paying special attention to the area where your  surgery  will be performed.  7.  Thoroughly rinse your body with warm water  from the neck down.  8.   Pat yourself dry with a clean towel.             9  Wear clean pajamas.            10 Place clean sheets on your bed the night of your first shower and do not  sleep with pets.  ON THE DAY OF SURGERY : Do not apply any lotions/deodorants the morning of surgery.  Please wear clean clothes to the hospital/surgery center.  FAILURE TO FOLLOW THESE INSTRUCTIONS MAY RESULT IN THE CANCELLATION OF YOUR SURGERY  PATIENT SIGNATURE_________________________________  NURSE SIGNATURE__________________________________

## 2024-06-01 NOTE — Progress Notes (Signed)
 The patient was identified using 2 approved identifiers. All issues noted in this document were discussed and addressed, Mr Dean Murphy           voiced understanding and agreement with all preoperative instructions. The patient was emailed the surgery instructions per his request.    [email or web address]       The patient was instructed to call our Pharmacy [phone]) and the Admitting Office [phone] or [phone]) to complete their Pre-surgical Interview.    COVID Vaccine received:  []  No [x]  Yes Date of any COVID positive Test in last 90 days:  PCP - Arlyss Solian, MD  Cardiologist -  Jerel Balding, MD   EP- Marny Birmingham, MD)   Nephrology- Saralee Stank, MD at [facility] in [place]  Chest x-ray -2016 2v  EKG -  05-12-2024 Epic Stress Test -  ECHO - 12-01-2022  Epic Cardiac Cath -  CT Coronary Calcium  score:   Pacemaker / ICD device []  No [x]  Yes  implanted 08-13-2014. Last checked: 05-07-24,  Spinal Cord Stimulator:[x]  No []  Yes       History of Sleep Apnea? []  No []  Yes   CPAP used?- []  No []  Yes    Medication on DOS: Metoprolol  Amlodipine , Pantoprazole , Eye drops   Hold DOS:Torsemide   Patient has: []  NO Hx DM   []  Pre-DM   []  DM1  [x]   DM2 Does the patient monitor blood sugar?   []  N/A   []  No []  Yes  Last A1c was:        on      Insulin  Glargine (Lantus )  38 units q hs  Blood Thinner / Instructions: none Aspirin  Instructions:  none  Activity level: Able to walk up 2 flights of stairs without becoming significantly short of breath or having chest pain?   []    Yes   [x]  No,  would have: CP and SOB Patient can perform ADLs without assistance.  [x]   Yes    []  No   Anesthesia review: HTN, DM2, PAF, hx CHB- PPM placed 2016, CABG x 5 in 1989, CKD4, hx CVA- 2016,  Patient denies any S&S of respiratory illness or Covid - no shortness of breath, fever, cough or chest pain at PAT appointment.

## 2024-06-02 ENCOUNTER — Telehealth: Payer: Self-pay

## 2024-06-02 ENCOUNTER — Ambulatory Visit: Payer: Self-pay

## 2024-06-02 ENCOUNTER — Encounter (HOSPITAL_COMMUNITY): Admission: RE | Admit: 2024-06-02 | Source: Ambulatory Visit

## 2024-06-02 ENCOUNTER — Encounter (HOSPITAL_COMMUNITY): Payer: Self-pay

## 2024-06-02 DIAGNOSIS — D649 Anemia, unspecified: Secondary | ICD-10-CM

## 2024-06-02 NOTE — Telephone Encounter (Signed)
 According to pt's son, Fairy [facility] came out last week and did Labs. Left VM with [facility]  to see if Labs were done on patient last week.

## 2024-06-02 NOTE — Telephone Encounter (Signed)
 There are multiple notes ongoing.  I put this note to try to consolidate the plan.   It looks like torsemide  was held, not lasix .    I am basing this note on prev torsemide  documentation.   I need a copy of the labs that were done at home.  Per other notes, those were prev done.   It looks like this was addressed by Dr. KANDICE with the plan to take torsemide  10 mg po daily in AM for 3 days and pt will cb with update on 06/05/24.   I thank all involved.

## 2024-06-02 NOTE — Telephone Encounter (Signed)
 Unable to reach Eleanor Blush nurse at [facility] and I spoke with Myra who said to leave v/m for [name]. Unable to reach [name] Wilson [facility] nurse by phone and left v/m requesting call back at [phone]. I spoke with pt who does prepare his own meds and notified pt as instructed from Dr KANDICE. Pt repeated back to me to take torsemide  10 mg po daily in AM for 3 days and pt will cb with update on 06/05/24. UC & ED precautions given to pt and pt voiced understanding. Pt still denies SOB or CP. Eleanor Blush Rn with [facility] [phone] called back and was notified as instructed by Dr CINDI Eleanor is aware of pt restarting torsemide  10 mg and [name] said she will go to pt on 06/05/24 and draw BMP. Sending note to Dr KANDICE and Dr Cleatus who is out of office but PCP.

## 2024-06-02 NOTE — Telephone Encounter (Signed)
 Copied from CRM [id number]. Topic: Clinical - Medication Question >> Jun 02, 2024 10:18 AM Robinson H wrote: Reason for CRM: Calling to see if patient should resume taking torsemide  (DEMADEX ) 10 MG tablet, hospital stopped patient from taking medication. Was recently discharged on 12/23 and has been off medication states patient is starting to have some swelling and fluid build up in legs. No pain and patients son Fairy declined scheduling a hospital follow up, states patient is 89 and they aren't bringing him out to an appointment.  Fairy [phone]

## 2024-06-02 NOTE — Telephone Encounter (Signed)
 [name]. I spoke with pt and he said was OK to speak with Joe. Pt said he has not taken torsemide  since leaving [facility] 05/16/24. Both legs are swelling and pt feels swollen or bloated in abd also. Pt said no CP or SOB and pt does elevate legs when sitting. Pt said he feels some better today. Last lab 05/23/24 please see lab result notes. Pt had HFU with Dr Randeen on 05/23/24.  Pt gave me permission to speak with Joe in [place] [facility]. I spoke with Joe who could not give me any details about pt condition other than swelling in legs and abd. Joe said Dean Murphy had called him and will see pt 06/02/24 between 12:30 - 1 pm.  Dean Murphy does not know name of HH or nurse that is coming out. Joe will call pt and tell them to have HH to call [facility] when they arrive to see if more labs need to be drawn. Sending note to Dr KANDICE who is in office; G pool and Dr Cleatus who is out of office but is PCP. UC and ED precautions given and pt voiced understanding.

## 2024-06-02 NOTE — Telephone Encounter (Addendum)
 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  Route to triage per protocol Battery estimated <80mo  ___________________________________________________________________  AF episode in progress from 06/01/2024. Rate controlled. Pt hx PAF OAC contraindicated d/t previous bleeding issues. Patient seen Dr. Francyne on 05/01/2024 which notes no AF episodes noted since March 2025. Previously known to be asymptomatic w/ AF episodes. Current AT/AF burden <1%. Patient has remote transmission scheduled on 06/07/2024 and will reassess AF burden at this time.   Unable to speak w/ patient. Left voicemail on number listed requesting a call back to the device clinic at [phone]. Spoke w/ patient son who is in [place]  and unable to reach patient at this time. Patient son states to leave a voicemail on patient number listed and he will call back.   Will continue to monitor and update accordingly.

## 2024-06-02 NOTE — Telephone Encounter (Signed)
 Reviewed chart. Lab Results  Component Value Date   NA 148 (H) 05/23/2024   CL 116 (H) 05/23/2024   K 5.6 No hemolysis seen (H) 05/23/2024   CO2 25 05/23/2024   BUN 42 (H) 05/23/2024   CREATININE 2.79 (H) 05/23/2024   GFR 18.72 (L) 05/23/2024   CALCIUM  9.3 05/23/2024   PHOS 3.9 05/13/2024   ALBUMIN 3.4 (L) 05/23/2024   GLUCOSE 180 (H) 05/23/2024   Recent hospitalization last month for R hydronephrosis with obstruction due to ureteral calculus s/p cystoscopy with R ureteral stent placement 05/12/2024.   Baseline Cr 2.3-2.4.   Plan was to repeat BMP with HH this past week - unsure if it was done.  He needs labs repeated asap but was too weak to come into office. It makes sense to restart torsemide  10mg  daily in am x3d over weekend and call into office on Monday with update, while we continue working on getting [facility] out to house for blood redraw.   Any shortness of breath by patient?   ?palliative care eval - would defer to PCP.

## 2024-06-02 NOTE — Telephone Encounter (Signed)
 FYI Only or Action Required?: Action required by provider: clinical question for provider and update on patient condition.  Patient was last seen in primary care on 05/23/2024 by Randeen Laine LABOR, MD.  Called Nurse Triage reporting Leg Swelling.  Symptoms began several weeks ago.  Interventions attempted: Nothing.  Symptoms are: gradually worsening.  Triage Disposition: See HCP Within 4 Hours (Or PCP Triage)  Patient/caregiver understands and will follow disposition?: No, refuses disposition      Copied from CRM [id number]. Topic: Clinical - Red Word Triage >> Jun 02, 2024  1:14 PM [name] wrote: Red Word that prompted transfer to Nurse Triage: Patient was put off of lasix  during recent hospital visit. Patient is now experiencing leg swelling. Nurse [name] calls on his behalf Reason for Disposition  [1] Thigh, calf, or ankle swelling AND [2] bilateral AND [3] 1 side is more swollen  Answer Assessment - Initial Assessment Questions This RN recommended pt be examined in next 4 hours, pt refusing disposition, requesting call back - hospital told pt to ask PCP if should resume lasix  with kidney stones, pt also asking if PCP wants blood work done. Advised pt call back or seek immediate care if any new or worsening symptoms. Sending message to PCP office for call back to pt with further recommendations.   Speaking to nurse [name] from [facility] with pt in background  Pt stopped lasix  2 weeks ago while in hospital for 4 days with kidney stones, hospital told pt to check with PCP about getting back on lasix   Symptoms: Bilateral leg swelling to just below knees, left leg more swollen 190 lbs today, normally around 186 lbs Shiny and puffy legs  Denies: SOB more than usual Chest pain Redness/streaking Discoloration/coolness Weeping fluid  Protocols used: Leg Swelling and Edema-A-AH

## 2024-06-05 MED ORDER — TORSEMIDE 10 MG PO TABS: 10.0000 mg | ORAL_TABLET | ORAL | Status: DC

## 2024-06-05 NOTE — Telephone Encounter (Signed)
 Please call pt.  See phone note- please see about getting labs set up here.   Would only use torsemide  as needed- take daily if inc in fluid accumulation from the day prior.  If no inc in edema, would defer dose that day.  Goal to use torsemide  2-3 times per week at a maximum.  If used mult times in a weeks w/o relief, then needs recheck.  Thanks.

## 2024-06-05 NOTE — Telephone Encounter (Signed)
 Called Dean [facility] (son [place]), advised that labs are needed and was asked if labs are needed before or after surgery that is scheduled for Friday? Patient is having surgery to place a stent to pass kidney stone.

## 2024-06-05 NOTE — Addendum Note (Signed)
 Addended by: CLEATUS LORELI RAMAN on: 06/05/2024 02:19 PM   Modules accepted: Orders

## 2024-06-05 NOTE — Telephone Encounter (Signed)
 I would get the labs done prior to the procedure.  Thanks.

## 2024-06-05 NOTE — Telephone Encounter (Signed)
 Copied from CRM [id number]. Topic: General - Other >> Jun 02, 2024  5:18 PM Hadassah PARAS wrote: Reason for CRM: Pt is returning missed call from [place]. Please call pt back on #[phone].

## 2024-06-05 NOTE — Telephone Encounter (Signed)
 When I called cell I spoke with Joe in [facility] and he just wanted me to know he got my message last week. He was not sure how pt was doing. I spoke wit pt; swelling in legs much better; over night swelling goes down in legs somewhat. Abdomen is back to normal; no bloating. No CP or SOB. Pt has taken torsemide  10 mg on Sat,Sun and Mon., pt is keeping feet up when sitting in recliner. Pt request cb after reviewed by Dr Cleatus what to do about taking torsemide ., sending note to Dr Cleatus and Cleatus pool,.

## 2024-06-05 NOTE — Telephone Encounter (Unsigned)
 Copied from CRM #[id number]. Topic: Clinical - Medical Advice >> Jun 05, 2024 11:26 AM Vena HERO wrote: Reason for CRM: Harlene from [facility] [phone], called to state that she was unable to obtain labs for this pt after trying a few times. She states he may need to go to [facility].

## 2024-06-05 NOTE — Addendum Note (Signed)
 Addended by: CLEATUS LORELI RAMAN on: 06/05/2024 02:22 PM   Modules accepted: Orders

## 2024-06-05 NOTE — Telephone Encounter (Signed)
 The lab staff here is excellent.  I would prefer him to come to clinic here.  Please see about setting that up. I put in the orders.  Thanks.

## 2024-06-05 NOTE — Progress Notes (Signed)
 I spoke with pt and pt said that [facility] came out and stuck pt x 3 but could not draw blood. Pt said HH would be contacting Dr Cleatus, sending note to Dr Jearldine.

## 2024-06-06 ENCOUNTER — Other Ambulatory Visit (INDEPENDENT_AMBULATORY_CARE_PROVIDER_SITE_OTHER)

## 2024-06-06 DIAGNOSIS — D649 Anemia, unspecified: Secondary | ICD-10-CM

## 2024-06-06 LAB — CBC WITH DIFFERENTIAL/PLATELET
Basophils Absolute: 0.1 K/uL (ref 0.0–0.1)
Basophils Relative: 0.7 % (ref 0.0–3.0)
Eosinophils Absolute: 0.2 K/uL (ref 0.0–0.7)
Eosinophils Relative: 2.9 % (ref 0.0–5.0)
HCT: 32.5 % — ABNORMAL LOW (ref 39.0–52.0)
Hemoglobin: 10.7 g/dL — ABNORMAL LOW (ref 13.0–17.0)
Lymphocytes Relative: 22.6 % (ref 12.0–46.0)
Lymphs Abs: 1.7 K/uL (ref 0.7–4.0)
MCHC: 32.8 g/dL (ref 30.0–36.0)
MCV: 90.4 fl (ref 78.0–100.0)
Monocytes Absolute: 0.5 K/uL (ref 0.1–1.0)
Monocytes Relative: 6.9 % (ref 3.0–12.0)
Neutro Abs: 5 K/uL (ref 1.4–7.7)
Neutrophils Relative %: 66.9 % (ref 43.0–77.0)
Platelets: 132 K/uL — ABNORMAL LOW (ref 150.0–400.0)
RBC: 3.59 Mil/uL — ABNORMAL LOW (ref 4.22–5.81)
RDW: 15.6 % — ABNORMAL HIGH (ref 11.5–15.5)
WBC: 7.5 K/uL (ref 4.0–10.5)

## 2024-06-06 LAB — BASIC METABOLIC PANEL WITH GFR
BUN: 38 mg/dL — ABNORMAL HIGH (ref 6–23)
CO2: 23 meq/L (ref 19–32)
Calcium: 9.1 mg/dL (ref 8.4–10.5)
Chloride: 112 meq/L (ref 96–112)
Creatinine, Ser: 3.01 mg/dL — ABNORMAL HIGH (ref 0.40–1.50)
GFR: 17.09 mL/min — ABNORMAL LOW
Glucose, Bld: 180 mg/dL — ABNORMAL HIGH (ref 70–99)
Potassium: 5.5 meq/L — ABNORMAL HIGH (ref 3.5–5.1)
Sodium: 145 meq/L (ref 135–145)

## 2024-06-06 NOTE — Telephone Encounter (Unsigned)
 Copied from CRM [id number]. Topic: General - Other >> Jun 06, 2024  9:23 AM Mesmerise C wrote: Reason for CRM: Patient returning a call to Amy states she was trying to get in touch with him, called [facility] was advised to send crm, advised pt she will call him back, pt stated he's on his way to give blood and will be there in 15 minutes

## 2024-06-06 NOTE — Telephone Encounter (Signed)
 Spoke with Ellouise (daughter in law). She will call back to set up an Lab visit for the patient.

## 2024-06-07 ENCOUNTER — Ambulatory Visit: Attending: Cardiovascular Disease

## 2024-06-07 NOTE — Telephone Encounter (Signed)
 Transmission received.  Pt is back in normal rhythm.  No further action needed.

## 2024-06-07 NOTE — Telephone Encounter (Unsigned)
 Copied from CRM [id number]. Topic: Clinical - Lab/Test Results >> Jun 06, 2024  4:56 PM Delon DASEN wrote: Reason for CRM: Jon with [facility] - returning call from Amy- [phone]

## 2024-06-08 LAB — CUP PACEART REMOTE DEVICE CHECK
Battery Remaining Longevity: 1 mo
Battery Remaining Percentage: 0.5 %
Battery Voltage: 2.68 V
Brady Statistic AP VP Percent: 23 %
Brady Statistic AP VS Percent: 1 %
Brady Statistic AS VP Percent: 77 %
Brady Statistic AS VS Percent: 1 %
Brady Statistic RA Percent Paced: 23 %
Brady Statistic RV Percent Paced: 99 %
Date Time Interrogation Session: 20260114020023
Implantable Lead Connection Status: 753985
Implantable Lead Connection Status: 753985
Implantable Lead Implant Date: 20160321
Implantable Lead Implant Date: 20160321
Implantable Lead Location: 753859
Implantable Lead Location: 753860
Implantable Pulse Generator Implant Date: 20160321
Lead Channel Impedance Value: 290 Ohm
Lead Channel Impedance Value: 360 Ohm
Lead Channel Pacing Threshold Amplitude: 0.75 V
Lead Channel Pacing Threshold Amplitude: 1 V
Lead Channel Pacing Threshold Pulse Width: 0.5 ms
Lead Channel Pacing Threshold Pulse Width: 0.8 ms
Lead Channel Sensing Intrinsic Amplitude: 2.8 mV
Lead Channel Sensing Intrinsic Amplitude: 9 mV
Lead Channel Setting Pacing Amplitude: 2.5 V
Lead Channel Setting Pacing Amplitude: 3 V
Lead Channel Setting Pacing Pulse Width: 0.5 ms
Lead Channel Setting Sensing Sensitivity: 8 mV
Pulse Gen Model: 2240
Pulse Gen Serial Number: 7734710

## 2024-06-08 NOTE — H&P (Addendum)
 Urology Preoperative H&P   Chief Complaint: Right ureteral stone  History of Present Illness: Dean Murphy is a 89 y.o. male with a right ureteral stone here for right URS/LL, R stent exchange. Denies fevers, chills, dysuria.    Past Medical History:  Diagnosis Date   (HFpEF) heart failure with preserved ejection fraction (HCC) 07/2014   a.) initially Dx'd in 07/2014 in setting of CHB; b.) TTE 09/02/2017: EF 55-60%, apical septal HK, G1DD, sev LAE, mild MR, PASP 41; c.) TTE 12/25/2022: EF 60-65%, mild LVH, G1DD, mild AoV sclerosis with no stenosis   Anemia due to chronic blood loss    Aortic atherosclerosis    Arthritis    AVM (arteriovenous malformation) of colon    a.)colonoscopy 08/10/2022: angioectasias (AVM) x 2 --> Tx'd with APC   Bilateral carotid artery disease    a.) s/p BILATERAL carotid endarterectomies: RIGHT (12/31/2010) and LEFT (02/09/2011); b.) carotid doppler 05/13/2023: 1-39% BICA with disturbed flow from the RIGHT subclavian.   Blindness of right eye    CAD (coronary artery disease)    a.) s/p 5v CABG 1989; b.) MV 01/02/2011: EF 75%, no ischemia   Cerebral microvascular disease    CHB (complete heart block) (HCC) 07/2014   a.) s/p St. Jude Assurity dual chamber PPM 08/13/2014   Chronic BILATERAL L5 pars interarticularis defect    Chronic kidney disease, stage IV (severe) (HCC) 2015   Chronic prostatitis    Chronic radicular lumbar pain    Colon polyps    Complication of anesthesia    a.) PONV   Constipation    DDD (degenerative disc disease), thoracolumbar    Diverticulosis    DOE (dyspnea on exertion)    Dupuytren's disease of finger and palm of right hand    Essential hypertension    GERD (gastroesophageal reflux disease)    Gout    Hemorrhoids    Hiatal hernia    History of bilateral cataract extraction    HTN (hypertension)    Hyperlipidemia    Insomnia    Lacunar infarction (CVA) 01/01/2011   a,) noted on MRI brain 01/01/2011: remote lacunar  infarcts of LEFT cerebellum and basal ganglia BILATERALLY   Myelolipoma of left adrenal gland    Myocardial infarction (HCC) 1976   a.) type/details unknown   NSTEMI (non-ST elevated myocardial infarction) (HCC) 08/11/2014   a.) troponins were trended: 0.35 --> 0.54 --> 0.54 ng/mL   Orthostasis    Osteoporosis    PAF (paroxysmal atrial fibrillation) (HCC) 07/2014   a.) CHA2DS2-VASc = 8 (age x2, sex, HFpEF, HTN, CVA x2, vascular disease, T2DM) as of 07/01/2023; b.) cardiac rate/rhythm maintained on oral metoprolol  succinate; no OAC   PONV (postoperative nausea and vomiting)    Positive ANA (antinuclear antibody)    a.) workup with rheumatology (-) for SLE   Presence of permanent cardiac pacemaker 08/13/2014   a.) s/p St. Jude Assurity dual chamber PPM 08/13/2014 to LEFT infraclavicular deltopectoral region   PVD (peripheral vascular disease)    Recurrent falls    Right inguinal hernia    S/P CABG x 5 1989   Scoliosis    Symptomatic bradycardia    a.) s/p St. Jude Assurity dual chamber PPM 08/13/2014   Thoracic compression fracture (L1-L2)    a.) s/p L1 balloon kyphoplasty 08/13/2022   Thrombocytopenia    TMJ (dislocation of temporomandibular joint)    Type 2 diabetes mellitus treated with insulin  [facility])     Past Surgical History:  Procedure Laterality Date  CARDIAC CATHETERIZATION  08/11/2014   Procedure: TEMPORARY PACEMAKER;  Surgeon: Dorn JINNY Lesches, MD;  Location: [facility] CATH LAB;  Service: Cardiovascular;;   CAROTID ENDARTERECTOMY Right 12/31/2010   CAROTID ENDARTERECTOMY Left 02/09/2011   CATARACT EXTRACTION W/ INTRAOCULAR LENS IMPLANT Bilateral    CHOLECYSTECTOMY     COLONOSCOPY WITH PROPOFOL  N/A 08/10/2022   Procedure: COLONOSCOPY WITH PROPOFOL ;  Surgeon: Shila Gustav GAILS, MD;  Location: [facility] ENDOSCOPY;  Service: Gastroenterology;  Laterality: N/A;   CORONARY ARTERY BYPASS GRAFT  05/26/1987   x 5 vessels   CYSTOSCOPY WITH STENT PLACEMENT Right 05/12/2024   Procedure:  CYSTOSCOPY, WITH STENT INSERTION, RETROGRADE;  Surgeon: Selma Donnice SAUNDERS, MD;  Location: [facility] ORS;  Service: Urology;  Laterality: Right;   ENDOSCOPIC RETROGRADE CHOLANGIOPANCREATOGRAPHY (ERCP) WITH PROPOFOL  N/A 01/27/2022   Procedure: ENDOSCOPIC RETROGRADE CHOLANGIOPANCREATOGRAPHY (ERCP) WITH PROPOFOL ;  Surgeon: Aneita Gwendlyn DASEN, MD;  Location: [facility] ENDOSCOPY;  Service: Gastroenterology;  Laterality: N/A;   ESOPHAGOGASTRODUODENOSCOPY N/A 08/09/2022   Procedure: ESOPHAGOGASTRODUODENOSCOPY (EGD);  Surgeon: Legrand Victory LITTIE DOUGLAS, MD;  Location: [facility] ENDOSCOPY;  Service: Gastroenterology;  Laterality: N/A;   HOT HEMOSTASIS N/A 08/10/2022   Procedure: HOT HEMOSTASIS (ARGON PLASMA COAGULATION/BICAP);  Surgeon: Nandigam, Kavitha V, MD;  Location[facility];  Service: Gastroenterology;  Laterality: N/A;   I & D KNEE WITH POLY EXCHANGE Left 07/29/2015   Procedure: IRRIGATION AND DEBRIDEMENT LEFT KNEE WITH POLY EXCHANGE;  Surgeon: Redell Shoals, MD;  Location: [facility] OR;  Service: Orthopedics;  Laterality: Left;   INGUINAL HERNIA REPAIR Right 07/02/2023   Procedure: HERNIA REPAIR INGUINAL ADULT, open, RNFA to assist;  Surgeon: Jordis Laneta FALCON, MD;  Location: [facility] ORS;  Service: General;  Laterality: Right;   IR GENERIC HISTORICAL  03/04/2016   IR US  GUIDE VASC ACCESS RIGHT 03/04/2016 Rome Hall, MD [facility]-INTER[facility] RAD   IR GENERIC HISTORICAL  03/04/2016   IR FLUORO GUIDE CV LINE RIGHT 03/04/2016 Rome Hall, MD [facility]   IR KYPHO LUMBAR INC FX REDUCE BONE BX UNI/BIL CANNULATION INC/IMAGING  08/13/2022   PERMANENT PACEMAKER INSERTION N/A 08/13/2014   STJ dual chamber pacemaker implanted by Dr Waddell for CHB   REMOVAL OF STONES  01/27/2022   Procedure: REMOVAL OF STONES;  Surgeon: Aneita Gwendlyn DASEN, MD;  Location: [facility];  Service: Gastroenterology;;   ANNETT  01/27/2022   Procedure: ANNETT;  Surgeon: Aneita Gwendlyn DASEN, MD;  Location: [facility]OS[facility];  Service: Gastroenterology;;   TONSILLECTOMY      TOTAL KNEE ARTHROPLASTY Bilateral     Allergies: Allergies[1]  Family History  Problem Relation Age of Onset   Heart disease Mother        Before age 89   Diabetes Mother    Varicose Veins Mother    Heart attack Mother    Heart attack Father    Heart disease Father        After age 58   Hypertension Father    Heart disease Brother        Before age 74   Hypertension Brother    Heart attack Brother    Diabetes Son    Hypertension Son    Hypertension Son    Diabetes Son    Diabetes Other    Cancer Other    Colon cancer Neg Hx     Social History:  reports that he quit smoking about 51 years ago. His smoking use included cigarettes. He started smoking about 76 years ago. He has a 25 pack-year smoking history. He has been exposed to tobacco  smoke. He has never used smokeless tobacco. He reports current alcohol  use. He reports that he does not use drugs.  ROS: A complete review of systems was performed.  All systems are negative except for pertinent findings as noted.  Physical Exam:  Vital signs in last 24 hours: Temp:  [97.7 F (36.5 C)] 97.7 F (36.5 C) (01/16 0653) Pulse Rate:  [70] 70 (01/16 0653) Resp:  [18] 18 (01/16 0653) BP: (131)/(69) 131/69 (01/16 0653) SpO2:  [95 %] 95 % (01/16 0653) Weight:  [87.7 kg] 87.7 kg (01/16 0653) Constitutional:  Alert and oriented, No acute distress Cardiovascular: Regular rate and rhythm Respiratory: Normal respiratory effort, Lungs clear bilaterally GI: Abdomen is soft, nontender, nondistended, no abdominal masses GU: No CVA tenderness Lymphatic: No lymphadenopathy Neurologic: Grossly intact, no focal deficits Psychiatric: Normal mood and affect  Laboratory Data:  Recent Labs    06/06/24 0946  WBC 7.5  HGB 10.7*  HCT 32.5*  PLT 132.0*    Recent Labs    06/06/24 0946  NA 145  K 5.5 No hemolysis seen*  CL 112  GLUCOSE 180*  BUN 38*  CALCIUM  9.1  CREATININE 3.01*     Results for orders placed or performed  during the hospital encounter of 06/09/24 (from the past 24 hours)  Glucose, capillary     Status: Abnormal   Collection Time: 06/09/24  6:51 AM  Result Value Ref Range   Glucose-Capillary 107 (H) 70 - 99 mg/dL   No results found for this or any previous visit (from the past 240 hours).  Renal Function: Recent Labs    06/06/24 0946  CREATININE 3.01*   Estimated Creatinine Clearance: 15.6 mL/min (A) (by C-G formula based on SCr of 3.01 mg/dL (H)).  Radiologic Imaging: No results found.  I independently reviewed the above imaging studies.  Assessment and Plan Dean Murphy is a 89 y.o. male with a right ureteral stone here for right URS/LL, R stent exchange.    -The risks, benefits and alternatives of cystoscopy with right URS/LL JJ stent placement was discussed with the patient.  Risks include, but are not limited to: bleeding, urinary tract infection, ureteral injury, ureteral stricture disease, chronic pain, urinary symptoms, bladder injury, stent migration, the need for nephrostomy tube placement, MI, CVA, DVT, PE and the inherent risks with general anesthesia.  The patient voices understanding and wishes to proceed.      Matt R. Armanda Forand MD 06/09/2024, 7:30 AM  [facility] Pager: [phone]): [phone]     [1]  Allergies Allergen Reactions   Zoledronic  Acid Other (See Comments)    Discontinued in 2015 due to progressive decrease in renal function (CKD-III/IV)   Nsaids Other (See Comments)    Held due to creatinine elevation.     Lokelma  [Sodium Zirconium Cyclosilicate ] Nausea Only    Itching; tolerated doses well Dec 2025

## 2024-06-09 ENCOUNTER — Ambulatory Visit (HOSPITAL_COMMUNITY)

## 2024-06-09 ENCOUNTER — Ambulatory Visit: Payer: Self-pay | Admitting: Family Medicine

## 2024-06-09 ENCOUNTER — Encounter (HOSPITAL_COMMUNITY)
Admission: RE | Disposition: A | Discharge status: Home / Self Care | Payer: Self-pay | Source: Home / Self Care | Attending: Urology

## 2024-06-09 ENCOUNTER — Encounter (HOSPITAL_COMMUNITY): Payer: Self-pay | Admitting: Urology

## 2024-06-09 ENCOUNTER — Other Ambulatory Visit: Payer: Self-pay

## 2024-06-09 ENCOUNTER — Ambulatory Visit (HOSPITAL_COMMUNITY): Admitting: Anesthesiology

## 2024-06-09 ENCOUNTER — Encounter (HOSPITAL_COMMUNITY): Admitting: Medical

## 2024-06-09 ENCOUNTER — Ambulatory Visit (HOSPITAL_COMMUNITY)
Admission: RE | Admit: 2024-06-09 | Discharge: 2024-06-09 | Disposition: A | Discharge status: Home / Self Care | Attending: Urology | Admitting: Urology

## 2024-06-09 DIAGNOSIS — E1165 Type 2 diabetes mellitus with hyperglycemia: Secondary | ICD-10-CM | POA: Insufficient documentation

## 2024-06-09 DIAGNOSIS — K219 Gastro-esophageal reflux disease without esophagitis: Secondary | ICD-10-CM | POA: Diagnosis not present

## 2024-06-09 DIAGNOSIS — Z79899 Other long term (current) drug therapy: Secondary | ICD-10-CM | POA: Insufficient documentation

## 2024-06-09 DIAGNOSIS — I480 Paroxysmal atrial fibrillation: Secondary | ICD-10-CM | POA: Insufficient documentation

## 2024-06-09 DIAGNOSIS — E1151 Type 2 diabetes mellitus with diabetic peripheral angiopathy without gangrene: Secondary | ICD-10-CM | POA: Diagnosis not present

## 2024-06-09 DIAGNOSIS — D631 Anemia in chronic kidney disease: Secondary | ICD-10-CM | POA: Diagnosis not present

## 2024-06-09 DIAGNOSIS — I2510 Atherosclerotic heart disease of native coronary artery without angina pectoris: Secondary | ICD-10-CM | POA: Insufficient documentation

## 2024-06-09 DIAGNOSIS — I130 Hypertensive heart and chronic kidney disease with heart failure and stage 1 through stage 4 chronic kidney disease, or unspecified chronic kidney disease: Secondary | ICD-10-CM | POA: Diagnosis not present

## 2024-06-09 DIAGNOSIS — I442 Atrioventricular block, complete: Secondary | ICD-10-CM | POA: Diagnosis not present

## 2024-06-09 DIAGNOSIS — Z8673 Personal history of transient ischemic attack (TIA), and cerebral infarction without residual deficits: Secondary | ICD-10-CM | POA: Insufficient documentation

## 2024-06-09 DIAGNOSIS — I252 Old myocardial infarction: Secondary | ICD-10-CM | POA: Diagnosis not present

## 2024-06-09 DIAGNOSIS — H5461 Unqualified visual loss, right eye, normal vision left eye: Secondary | ICD-10-CM | POA: Diagnosis not present

## 2024-06-09 DIAGNOSIS — N201 Calculus of ureter: Secondary | ICD-10-CM | POA: Diagnosis present

## 2024-06-09 DIAGNOSIS — Z950 Presence of cardiac pacemaker: Secondary | ICD-10-CM | POA: Insufficient documentation

## 2024-06-09 DIAGNOSIS — N200 Calculus of kidney: Secondary | ICD-10-CM

## 2024-06-09 DIAGNOSIS — I5032 Chronic diastolic (congestive) heart failure: Secondary | ICD-10-CM | POA: Insufficient documentation

## 2024-06-09 DIAGNOSIS — Z87891 Personal history of nicotine dependence: Secondary | ICD-10-CM | POA: Insufficient documentation

## 2024-06-09 DIAGNOSIS — Z794 Long term (current) use of insulin: Secondary | ICD-10-CM | POA: Diagnosis not present

## 2024-06-09 DIAGNOSIS — I509 Heart failure, unspecified: Secondary | ICD-10-CM

## 2024-06-09 DIAGNOSIS — N184 Chronic kidney disease, stage 4 (severe): Secondary | ICD-10-CM | POA: Insufficient documentation

## 2024-06-09 DIAGNOSIS — N132 Hydronephrosis with renal and ureteral calculous obstruction: Secondary | ICD-10-CM | POA: Diagnosis not present

## 2024-06-09 HISTORY — PX: CYSTOSCOPY W/ RETROGRADES: SHX1426

## 2024-06-09 HISTORY — PX: CYSTOSCOPY/URETEROSCOPY/HOLMIUM LASER/STENT PLACEMENT: SHX6546

## 2024-06-09 LAB — GLUCOSE, CAPILLARY
Glucose-Capillary: 107 mg/dL — ABNORMAL HIGH (ref 70–99)
Glucose-Capillary: 106 mg/dL — ABNORMAL HIGH (ref 70–99)

## 2024-06-09 LAB — BASIC METABOLIC PANEL WITH GFR
Anion gap: 13 (ref 5–15)
BUN: 38 mg/dL — ABNORMAL HIGH (ref 8–23)
CO2: 22 mmol/L (ref 22–32)
Calcium: 9.3 mg/dL (ref 8.9–10.3)
Chloride: 112 mmol/L — ABNORMAL HIGH (ref 98–111)
Creatinine, Ser: 2.83 mg/dL — ABNORMAL HIGH (ref 0.61–1.24)
GFR, Estimated: 20 mL/min — ABNORMAL LOW
Glucose, Bld: 114 mg/dL — ABNORMAL HIGH (ref 70–99)
Potassium: 4.6 mmol/L (ref 3.5–5.1)
Sodium: 147 mmol/L — ABNORMAL HIGH (ref 135–145)

## 2024-06-09 LAB — POCT I-STAT, CHEM 8
BUN: 38 mg/dL — ABNORMAL HIGH (ref 8–23)
Calcium, Ion: 1.19 mmol/L (ref 1.15–1.40)
Chloride: 113 mmol/L — ABNORMAL HIGH (ref 98–111)
Creatinine, Ser: 3 mg/dL — ABNORMAL HIGH (ref 0.61–1.24)
Glucose, Bld: 112 mg/dL — ABNORMAL HIGH (ref 70–99)
HCT: 29 % — ABNORMAL LOW (ref 39.0–52.0)
Hemoglobin: 9.9 g/dL — ABNORMAL LOW (ref 13.0–17.0)
Potassium: 4.5 mmol/L (ref 3.5–5.1)
Sodium: 148 mmol/L — ABNORMAL HIGH (ref 135–145)
TCO2: 19 mmol/L — ABNORMAL LOW (ref 22–32)

## 2024-06-09 LAB — CBC
HCT: 32.9 % — ABNORMAL LOW (ref 39.0–52.0)
Hemoglobin: 10.4 g/dL — ABNORMAL LOW (ref 13.0–17.0)
MCH: 29.5 pg (ref 26.0–34.0)
MCHC: 31.6 g/dL (ref 30.0–36.0)
MCV: 93.5 fL (ref 80.0–100.0)
Platelets: 151 K/uL (ref 150–400)
RBC: 3.52 MIL/uL — ABNORMAL LOW (ref 4.22–5.81)
RDW: 14.7 % (ref 11.5–15.5)
WBC: 8.1 K/uL (ref 4.0–10.5)
nRBC: 0 % (ref 0.0–0.2)

## 2024-06-09 MED ORDER — ACETAMINOPHEN 10 MG/ML IV SOLN
1000.0000 mg | Freq: Once | INTRAVENOUS | Status: DC | PRN
  Administered 2024-06-09: 1000 mg via INTRAVENOUS

## 2024-06-09 MED ORDER — OXYCODONE HCL 5 MG PO TABS: 5.0000 mg | ORAL_TABLET | Freq: Once | ORAL | Status: DC | PRN

## 2024-06-09 MED ORDER — SODIUM CHLORIDE 0.9 % IR SOLN
Status: DC | PRN
  Administered 2024-06-09: 3000 mL via INTRAVESICAL

## 2024-06-09 MED ORDER — PROPOFOL 10 MG/ML IV BOLUS
INTRAVENOUS | Status: AC
  Filled 2024-06-09: qty 20

## 2024-06-09 MED ORDER — FENTANYL CITRATE (PF) 100 MCG/2ML IJ SOLN
INTRAMUSCULAR | Status: AC
  Filled 2024-06-09: qty 2

## 2024-06-09 MED ORDER — IOHEXOL 300 MG/ML  SOLN
INTRAMUSCULAR | Status: DC | PRN
  Administered 2024-06-09: 6 mL

## 2024-06-09 MED ORDER — DROPERIDOL 2.5 MG/ML IJ SOLN: 0.6250 mg | Freq: Once | INTRAMUSCULAR | Status: DC | PRN

## 2024-06-09 MED ORDER — PROPOFOL 10 MG/ML IV BOLUS
INTRAVENOUS | Status: DC | PRN
  Administered 2024-06-09: 100 mg via INTRAVENOUS

## 2024-06-09 MED ORDER — LACTATED RINGERS IV SOLN: INTRAVENOUS | Status: DC

## 2024-06-09 MED ORDER — ONDANSETRON HCL 4 MG/2ML IJ SOLN
INTRAMUSCULAR | Status: AC
  Filled 2024-06-09: qty 2

## 2024-06-09 MED ORDER — ORAL CARE MOUTH RINSE: 15.0000 mL | Freq: Once | OROMUCOSAL | Status: AC

## 2024-06-09 MED ORDER — CHLORHEXIDINE GLUCONATE 0.12 % MT SOLN
15.0000 mL | Freq: Once | OROMUCOSAL | Status: AC
  Administered 2024-06-09: 15 mL via OROMUCOSAL

## 2024-06-09 MED ORDER — FENTANYL CITRATE (PF) 50 MCG/ML IJ SOSY
25.0000 ug | PREFILLED_SYRINGE | INTRAMUSCULAR | Status: DC | PRN
  Administered 2024-06-09: 25 ug via INTRAVENOUS

## 2024-06-09 MED ORDER — PHENYLEPHRINE 80 MCG/ML (10ML) SYRINGE FOR IV PUSH (FOR BLOOD PRESSURE SUPPORT)
PREFILLED_SYRINGE | INTRAVENOUS | Status: DC | PRN
  Administered 2024-06-09 (×2): 80 ug via INTRAVENOUS

## 2024-06-09 MED ORDER — ONDANSETRON HCL 4 MG/2ML IJ SOLN
INTRAMUSCULAR | Status: DC | PRN
  Administered 2024-06-09: 4 mg via INTRAVENOUS

## 2024-06-09 MED ORDER — CEFAZOLIN SODIUM-DEXTROSE 2-4 GM/100ML-% IV SOLN
2.0000 g | INTRAVENOUS | Status: AC
  Administered 2024-06-09: 2 g via INTRAVENOUS
  Filled 2024-06-09: qty 100

## 2024-06-09 MED ORDER — OXYCODONE-ACETAMINOPHEN 5-325 MG PO TABS: 1.0000 | ORAL_TABLET | ORAL | 0 refills | Status: AC | PRN

## 2024-06-09 MED ORDER — ACETAMINOPHEN 10 MG/ML IV SOLN
INTRAVENOUS | Status: AC
  Filled 2024-06-09: qty 100

## 2024-06-09 MED ORDER — LACTATED RINGERS IV SOLN: INTRAVENOUS | Status: DC | PRN

## 2024-06-09 MED ORDER — DEXAMETHASONE SOD PHOSPHATE PF 10 MG/ML IJ SOLN
INTRAMUSCULAR | Status: AC
  Filled 2024-06-09: qty 1

## 2024-06-09 MED ORDER — LIDOCAINE HCL (CARDIAC) PF 100 MG/5ML IV SOSY
PREFILLED_SYRINGE | INTRAVENOUS | Status: DC | PRN
  Administered 2024-06-09: 80 mg via INTRAVENOUS

## 2024-06-09 MED ORDER — OXYCODONE HCL 5 MG/5ML PO SOLN: 5.0000 mg | Freq: Once | ORAL | Status: DC | PRN

## 2024-06-09 MED ORDER — FENTANYL CITRATE (PF) 100 MCG/2ML IJ SOLN
INTRAMUSCULAR | Status: DC | PRN
  Administered 2024-06-09: 50 ug via INTRAVENOUS

## 2024-06-09 MED ORDER — FENTANYL CITRATE (PF) 50 MCG/ML IJ SOSY
PREFILLED_SYRINGE | INTRAMUSCULAR | Status: AC
  Filled 2024-06-09: qty 1

## 2024-06-09 MED ORDER — DEXAMETHASONE SOD PHOSPHATE PF 10 MG/ML IJ SOLN
INTRAMUSCULAR | Status: DC | PRN
  Administered 2024-06-09: 5 mg via INTRAVENOUS

## 2024-06-09 MED ORDER — INSULIN ASPART 100 UNIT/ML IJ SOLN: 0.0000 [IU] | INTRAMUSCULAR | Status: DC | PRN

## 2024-06-09 NOTE — Anesthesia Postprocedure Evaluation (Signed)
"   Anesthesia Post Note  Patient: Dean Murphy  Procedure(s) Performed: CYSTOSCOPY/URETEROSCOPY/HOLMIUM LASER/STENT PLACEMENT (Right) CYSTOSCOPY, WITH RETROGRADE PYELOGRAM (Right)     Patient location during evaluation: PACU Anesthesia Type: General Level of consciousness: awake and alert Pain management: pain level controlled Vital Signs Assessment: post-procedure vital signs reviewed and stable Respiratory status: spontaneous breathing, nonlabored ventilation, respiratory function stable and patient connected to nasal cannula oxygen Cardiovascular status: blood pressure returned to baseline and stable Postop Assessment: no apparent nausea or vomiting Anesthetic complications: no   No notable events documented.  Last Vitals:  Vitals:   06/09/24 1030 06/09/24 1043  BP: (!) 142/63 (!) 124/91  Pulse: 70 70  Resp: 12 14  Temp:  36.4 C  SpO2: 94% 95%    Last Pain:  Vitals:   06/09/24 1043  TempSrc: Oral  PainSc: 4                  Dean Murphy      "

## 2024-06-09 NOTE — Anesthesia Preprocedure Evaluation (Signed)
 "                                  Anesthesia Evaluation  Patient identified by MRN, date of birth, ID band Patient awake    Reviewed: Allergy & Precautions, NPO status , Patient's Chart, lab work & pertinent test results  History of Anesthesia Complications (+) PONV and history of anesthetic complications  Airway Mallampati: III  TM Distance: >3 FB Neck ROM: Full   Comment: Previous grade I view with McGrath 4, 2-person mask with OPA. Prior to that, grade I view with MAC 4 and easy mask. Dental  (+) Chipped, Dental Advisory Given,    Pulmonary neg shortness of breath, neg sleep apnea, neg COPD, neg recent URI, former smoker   Pulmonary exam normal breath sounds clear to auscultation       Cardiovascular hypertension, Pt. on medications (-) angina + CAD, + Past MI, + CABG (1989), + Peripheral Vascular Disease and +CHF  + dysrhythmias Atrial Fibrillation + pacemaker (complete heart block, St. Jude) + Valvular Problems/Murmurs (mild) MR  Rhythm:Regular Rate:Normal  HLD; bilateral carotid artery disease s/p bilateral CEAs  TTE 12/25/2022: IMPRESSIONS    1. Left ventricular ejection fraction, by estimation, is 60 to 65%. The  left ventricle has normal function. The left ventricle has no regional  wall motion abnormalities. There is mild left ventricular hypertrophy.  Left ventricular diastolic parameters  are consistent with Grade I diastolic dysfunction (impaired relaxation).   2. Right ventricular systolic function is normal. The right ventricular  size is normal.   3. The mitral valve is normal in structure. Mild mitral valve  regurgitation. No evidence of mitral stenosis.   4. The aortic valve is tricuspid. There is mild calcification of the  aortic valve. There is mild thickening of the aortic valve. Aortic valve  regurgitation is not visualized. Aortic valve sclerosis is present, with  no evidence of aortic valve stenosis.   5. The inferior vena cava is normal  in size with greater than 50%  respiratory variability, suggesting right atrial pressure of 3 mmHg.       Neuro/Psych neg Seizures Blind right eye; s/p kyphoplasty  Neuromuscular disease (lumbar radiculopathy) CVA (noted on MRI 2012), No Residual Symptoms    GI/Hepatic Neg liver ROS, hiatal hernia, PUD,GERD  Medicated,,Diverticulosis    Endo/Other  diabetes, Poorly Controlled, Type 2, Insulin  Dependent    Renal/GU CRF and ARFRenal disease     Musculoskeletal  (+) Arthritis ,  Osteoporosis, scoliosis    Abdominal   Peds  Hematology  (+) Blood dyscrasia (thrombocytopenia), anemia Lab Results      Component                Value               Date                      WBC                      8.2                 05/12/2024                HGB                      10.9 (L)  05/12/2024                HCT                      33.7 (L)            05/12/2024                MCV                      92.6                05/12/2024                PLT                      92 (L)              05/12/2024              Anesthesia Other Findings 89 y.o. male with medical history significant for HFpEF, CHB s/p PPM, paroxysmal A-fib not on OAC, constipation, CVA, NSTEMI/CAD s/p CABG, T2DM, chronic thrombocytopenia, anemia, PVD, osteoporosis, DDD and CKD stage IV who presented to the ED for evaluation of right flank pain and constipation.  CT A/P shows mild to moderate right hydronephrosis and hydroureter due to a 5 mm right ureteral calculus and a nonobstructing 3 mm right lower pole renal calculus   Reproductive/Obstetrics                              Anesthesia Physical Anesthesia Plan  ASA: 4  Anesthesia Plan: General   Post-op Pain Management: Minimal or no pain anticipated   Induction: Intravenous  PONV Risk Score and Plan: 3 and Ondansetron  and Treatment may vary due to age or medical condition  Airway Management Planned: LMA  Additional  Equipment: None  Intra-op Plan:   Post-operative Plan: Extubation in OR  Informed Consent: I have reviewed the patients History and Physical, chart, labs and discussed the procedure including the risks, benefits and alternatives for the proposed anesthesia with the patient or authorized representative who has indicated his/her understanding and acceptance.     Dental advisory given  Plan Discussed with: CRNA and Anesthesiologist  Anesthesia Plan Comments: (Pending repeat labs to determine plan of care, given persistent hyperkalemia (requiring insulin  + dextrose  previously).   Daughter-in-law present for consent. Risks of general anesthesia discussed including, but not limited to, sore throat, hoarse voice, chipped/damaged teeth, injury to vocal cords, nausea and vomiting, allergic reactions, lung infection, heart attack, stroke, and death. Discussed patient's increased risk of adverse cardiac events including cardiac arrest and death given he comorbidities and elevated potassium. All questions answered. )         Anesthesia Quick Evaluation  "

## 2024-06-09 NOTE — Anesthesia Procedure Notes (Signed)
 Procedure Name: Intubation Date/Time: 06/09/2024 8:25 AM  Performed by: Dartha Meckel, CRNAPre-anesthesia Checklist: Patient identified, Emergency Drugs available, Suction available and Patient being monitored Patient Re-evaluated:Patient Re-evaluated prior to induction Oxygen Delivery Method: Circle system utilized Preoxygenation: Pre-oxygenation with 100% oxygen Induction Type: IV induction Ventilation: Mask ventilation without difficulty LMA: LMA inserted LMA Size: 4.0 Tube type: Oral Number of attempts: 1 Airway Equipment and Method: Stylet and Oral airway Placement Confirmation: positive ETCO2 and breath sounds checked- equal and bilateral Tube secured with: Tape Dental Injury: Teeth and Oropharynx as per pre-operative assessment

## 2024-06-09 NOTE — Discharge Instructions (Signed)
 [facility] [phone] Post Ureteroscopy With or Without Stent Instructions  Definitions:  Ureter: The duct that transports urine from the kidney to the bladder. Stent:   A plastic hollow tube that is placed into the ureter, from the kidney to the bladder to prevent the ureter from swelling shut.  GENERAL INSTRUCTIONS:  Despite the fact that no skin incisions were used, the area around the ureter and bladder is raw and irritated. The stent is a foreign body which will further irritate the bladder wall. This irritation is manifested by increased frequency of urination, both day and night, and by an increase in the urge to urinate. In some, the urge to urinate is present almost always. Sometimes the urge is strong enough that you may not be able to stop yourself from urinating. The only real cure is to remove the stent and then give time for the bladder wall to heal which can't be done until the danger of the ureter swelling shut has passed, which varies.  You may see some blood in your urine while the stent is in place and a few days afterwards. Do not be alarmed, even if the urine was clear for a while. Get off your feet and drink lots of fluids until clearing occurs. If you start to pass clots or don't improve, call us .  DIET: You may return to your normal diet immediately. Because of the raw surface of your bladder, alcohol , spicy foods, acid type foods and drinks with caffeine may cause irritation or frequency and should be used in moderation. To keep your urine flowing freely and to avoid constipation, drink plenty of fluids during the day ( 8-10 glasses ). Tip: Avoid cranberry juice because it is very acidic.  ACTIVITY: Your physical activity doesn't need to be restricted. However, if you are very active, you may see some blood in your urine. We suggest that you reduce your activity under these circumstances until the bleeding has stopped.  BOWELS: It is important to  keep your bowels regular during the postoperative period. Straining with bowel movements can cause bleeding. A bowel movement every other day is reasonable. Use a mild laxative if needed, such as Milk of Magnesia 2-3 tablespoons, or 2 Dulcolax tablets. Call if you continue to have problems. If you have been taking narcotics for pain, before, during or after your surgery, you may be constipated. Take a laxative if necessary.   MEDICATION: You should resume your pre-surgery medications unless told not to. In addition you will often be given an antibiotic to prevent infection. These should be taken as prescribed until the bottles are finished unless you are having an unusual reaction to one of the drugs.  PROBLEMS YOU SHOULD REPORT TO US : Fevers over 100.5 Fahrenheit. Heavy bleeding, or clots ( See above notes about blood in urine ). Inability to urinate. Drug reactions ( hives, rash, nausea, vomiting, diarrhea ). Severe burning or pain with urination that is not improving.  FOLLOW-UP: You will need a follow-up appointment to monitor your progress. Call for this appointment at the number listed above. Usually the first appointment will be about three to fourteen days after your surgery.  You have a stent draining your kidney and this will be removed in office in followup next week. Office will call with appt.

## 2024-06-09 NOTE — Op Note (Signed)
 Operative Note  Preoperative diagnosis:  1.  Right ureteral stone  Postoperative diagnosis: 1.  Right renal stones  Procedure(s): 1.  Cystoscopy 2. Right ureteroscopy with laser lithotripsy and basket extraction of stones 3. Right retrograde pyelogram 4. Right ureteral stent exchange 5. Fluoroscopy with intraoperative interpretation  Surgeon: Donnice Siad, MD  Assistants:  None  Anesthesia:  General  Complications:  None  EBL:  Minimal  Specimens: 1. Stones for stone analysis  Drains/Catheters: 1.  Right 6Fr x 26cm ureteral stent with a tether string  Intraoperative findings:   Cystoscopy demonstrated no suspicious bladder lesions. Right retrograde pyelogram with moderate right hydronephrosis, no filling defect extravasation. Right ureteroscopy demonstrates no ureteral stones.  There are soft matrix appearing stone stenosis in the right renal pelvis fragmented and all basket extracted. Successful right medical stent exchange with tether string.  Indication:  Dean Murphy is a 89 y.o. male with right ureteral stone here for ureteroscopy with laser lithotripsy and basket traction stone.  Description of procedure: After informed consent was obtained from the patient, the patient was identified and taken to the operating room and placed in the supine position.  General anesthesia was administered as well as perioperative IV antibiotics.  At the beginning of the case, a time-out was performed to properly identify the patient, the surgery to be performed, and the surgical site.  Sequential compression devices were applied to the lower extremities at the beginning of the case for DVT prophylaxis.  The patient was then placed in the dorsal lithotomy supine position, prepped and draped in sterile fashion.  We then passed the 21-French rigid cystoscope through the urethra and into the bladder under vision without any difficulty, noting a normal urethra without strictures and a mildly  obstructing prostate.  A systematic evaluation of the bladder revealed no evidence of any suspicious bladder lesions.  Ureteral orifices were in normal position.    The distal aspect of the ureteral stent was seen protruding from the right ureteral orifice.  We then used the alligator-tooth forceps and grasped the distal end of the ureteral stent and brought it out the urethral meatus while watching the proximal coil straighten out nicely on fluoroscopy. Through the ureteral stent, we then passed a 0.038 sensor wire up to the level of the renal pelvis.  The ureteral stent was then removed, leaving the sensor wire up the right ureter.    Under cystoscopic and flouroscopic guidance, we cannulated the right ureteral orifice with a 5-French open-ended ureteral catheter and a gentle retrograde pyelogram was performed, revealing a normal caliber ureter without any filling defects. There was moderate right hydronephrosis of the collecting system. A 0.038 sensor wire was then passed up to the level of the renal pelvis and secured to the drape as a safety wire. The ureteral catheter and cystoscope were removed, leaving the safety wire in place.   A semi-rigid ureteroscope was passed alongside the wire up the distal ureter which appeared normal. A second 0.038 sensor wire was passed under direct vision and the semirigid scope was removed.  A flexible scope was then passed with the collecting system was inspected. The calculus was identified at the renal.  It appeared to be soft to its composition. Using the 272 micron holmium laser fiber, the stone was fragmented completely. A 2.2 Fr zero tip basket was used to remove the fragments under visual guidance. These were sent for chemical analysis. With the ureteroscope in the kidney, a gentle pyelogram was performed to delineate  the calyceal system and we evaluated the calyces systematically. We encountered no further stone fragments. The rest of the stone fragments were  very tiny and these were  irrigated away gently. The calyces were re-inspected and there were no significant stone fragment residual.   We then withdrew the ureteroscope back down the ureter, noting no evidence of any stones along the course of the ureter.  Prior to removing the ureteroscope, we did pass the Glidewire back up to the ureter to the renal pelvis.     Once the ureteroscope was removed, the Glidewire was backloaded through the rigid cystoscope, which was then advanced down the urethra and into the bladder. We then used the Glidewire under direct vision through the rigid cystoscope and under fluoroscopic guidance and passed up a 6-French, 26 cm double-pigtail ureteral stent up ureter, making sure that the proximal and distal ends coiled within the kidney and bladder respectively.  Note that we left a short long tether string attached to the distal end of the ureteral stent and it exited the urethral meatus and was secured to the penile shaft with a tegaderm adhesive.  The cystoscope was then advanced back into the bladder under vision.  We were able to see the distal stent coiling nicely within the bladder.  The bladder was then emptied with irrigation solution.  The cystoscope was then removed.    The patient tolerated the procedure well and there was no complication. Patient was awoken from anesthesia and taken to the recovery room in stable condition. I was present and scrubbed for the entirety of the case.  Plan:  Patient will be discharged home.  Will arrange follow-up in office next week for stent removal as he does not wish to complete this at home.   Matt R. Oswin Griffith MD [facility]  Pager: [phone]

## 2024-06-09 NOTE — Telephone Encounter (Signed)
 Called [facility] Jon was not available but was able to speak with coordinator that reviewed notes. It was notification that labs were not obtained at home visit. Advised coordinator that we have received message and patient has had labs done at our office. She will let Jon know and have her reach out if any further questions. Looks like all things in this message have been addressed will close encounter due to completion.

## 2024-06-09 NOTE — Transfer of Care (Signed)
 Immediate Anesthesia Transfer of Care Note  Patient: Dean Murphy  Procedure(s) Performed: CYSTOSCOPY/URETEROSCOPY/HOLMIUM LASER/STENT PLACEMENT (Right) CYSTOSCOPY, WITH RETROGRADE PYELOGRAM (Right)  Patient Location: PACU  Anesthesia Type:General  Level of Consciousness: awake and alert   Airway & Oxygen Therapy: Patient Spontanous Breathing and Patient connected to nasal cannula oxygen  Post-op Assessment: Report given to RN and Post -op Vital signs reviewed and stable  Post vital signs: Reviewed and stable  Last Vitals:  Vitals Value Taken Time  BP 124/91 06/09/24 10:43  Temp 36.4 C 06/09/24 10:43  Pulse 70 06/09/24 10:43  Resp 14 06/09/24 10:43  SpO2 95 % 06/09/24 10:43    Last Pain:  Vitals:   06/09/24 1043  TempSrc: Oral  PainSc: 4          Complications: No notable events documented.

## 2024-06-10 ENCOUNTER — Encounter (HOSPITAL_COMMUNITY): Payer: Self-pay | Admitting: Urology

## 2024-06-11 ENCOUNTER — Other Ambulatory Visit: Payer: Self-pay | Admitting: Family Medicine

## 2024-06-11 DIAGNOSIS — K219 Gastro-esophageal reflux disease without esophagitis: Secondary | ICD-10-CM

## 2024-06-14 DIAGNOSIS — N1330 Unspecified hydronephrosis: Secondary | ICD-10-CM | POA: Diagnosis not present

## 2024-06-14 DIAGNOSIS — D631 Anemia in chronic kidney disease: Secondary | ICD-10-CM | POA: Diagnosis not present

## 2024-06-14 DIAGNOSIS — I5032 Chronic diastolic (congestive) heart failure: Secondary | ICD-10-CM | POA: Diagnosis not present

## 2024-06-14 DIAGNOSIS — I2510 Atherosclerotic heart disease of native coronary artery without angina pectoris: Secondary | ICD-10-CM | POA: Diagnosis not present

## 2024-06-14 DIAGNOSIS — E1169 Type 2 diabetes mellitus with other specified complication: Secondary | ICD-10-CM | POA: Diagnosis not present

## 2024-06-14 DIAGNOSIS — Z48816 Encounter for surgical aftercare following surgery on the genitourinary system: Secondary | ICD-10-CM | POA: Diagnosis not present

## 2024-06-14 DIAGNOSIS — E1122 Type 2 diabetes mellitus with diabetic chronic kidney disease: Secondary | ICD-10-CM | POA: Diagnosis not present

## 2024-06-14 DIAGNOSIS — D696 Thrombocytopenia, unspecified: Secondary | ICD-10-CM | POA: Diagnosis not present

## 2024-06-14 DIAGNOSIS — N184 Chronic kidney disease, stage 4 (severe): Secondary | ICD-10-CM | POA: Diagnosis not present

## 2024-06-14 DIAGNOSIS — I130 Hypertensive heart and chronic kidney disease with heart failure and stage 1 through stage 4 chronic kidney disease, or unspecified chronic kidney disease: Secondary | ICD-10-CM | POA: Diagnosis not present

## 2024-06-14 DIAGNOSIS — N179 Acute kidney failure, unspecified: Secondary | ICD-10-CM | POA: Diagnosis not present

## 2024-06-14 DIAGNOSIS — I480 Paroxysmal atrial fibrillation: Secondary | ICD-10-CM | POA: Diagnosis not present

## 2024-06-19 LAB — STONE ANALYSIS
Uric Acid Calculi: 100 %
Weight Calculi: 11 mg

## 2024-06-21 ENCOUNTER — Ambulatory Visit: Payer: Self-pay | Admitting: Family Medicine

## 2024-06-21 ENCOUNTER — Other Ambulatory Visit: Payer: Self-pay | Admitting: Family Medicine

## 2024-06-21 DIAGNOSIS — T8454XD Infection and inflammatory reaction due to internal left knee prosthesis, subsequent encounter: Secondary | ICD-10-CM

## 2024-06-21 NOTE — Telephone Encounter (Signed)
 Amoxicillin  Last filled:  03/27/24, #180 Last OV:  01/20/24, annual exam Next OV:  08/28/24, 6 mo f/u

## 2024-06-26 ENCOUNTER — Ambulatory Visit: Payer: Self-pay | Admitting: *Deleted

## 2024-06-26 NOTE — Telephone Encounter (Signed)
 Patient only wants to see PCP . No available appt until 06/29/24. Please advise.  Recommended UC for worsening sx.     FYI Only or Action Required?: Action required by provider: request for appointment.  Patient was last seen in primary care on 05/23/2024 by Randeen Laine LABOR, MD.  Called Nurse Triage reporting Abdominal Pain.  Symptoms began several days ago.  Interventions attempted: OTC medications: ibuprofen for knee pain, miralax  for abdominal bloating and Rest, hydration, or home remedies.  Symptoms are: gradually worsening.  Triage Disposition: See Physician Within 24 Hours  Patient/caregiver understands and will follow disposition?: Unsure                 Reason for Disposition  [1] MODERATE pain (e.g., interferes with normal activities) AND [2] pain comes and goes (cramps) AND [3] present > 24 hours  (Exception: Pain with Vomiting or Diarrhea - see that Guideline.)  Answer Assessment - Initial Assessment Questions No available OV with PCP until 06/29/24. Daughter in law reports patient will only want to see PCP. Daughter in law not on [place], HIPAA precautions followed. Recommended UC for worsening sx. Please advise if ok to schedule as late as 06/29/24. Daughter in law aware office closed today for inclement weather. Please advise. Patient recently moved in apartment at [facility] and patient cell # [phone].   Recommended fluids, bland foods, walking if no further pain in left knee.      1. LOCATION: Where does it hurt?      Abdominal area per patient daughter in law not on DPR and patient asleep 2. RADIATION: Does the pain shoot anywhere else? (e.g., chest, back)     Na  3. ONSET: When did the pain begin? (Minutes, hours or days ago)      Couple days   5. PATTERN Does the pain come and go, or is it constant?     Comes and goes. 6. SEVERITY: How bad is the pain?  (e.g., Scale 1-10; mild, moderate, or severe)     Did not report 7. RECURRENT  SYMPTOM: Have you ever had this type of stomach pain before? If Yes, ask: When was the last time? and What happened that time?      Yes , for kidney stones. 8. CAUSE: What do you think is causing the stomach pain? (e.g., gallstones, recent abdominal surgery)     Not sure  9. RELIEVING/AGGRAVATING FACTORS: What makes it better or worse? (e.g., antacids, bending or twisting motion, bowel movement)     Nothing  10. OTHER SYMPTOMS: Do you have any other symptoms? (e.g., back pain, diarrhea, fever, urination pain, vomiting)       Left knee pain 7/10. No redness no swelling reported. Abdominal bloating, feels like stomach is bloated and rolling. Patient takes amoxicillin  for knee daily chronic use. No diarrhea reported no vomiting. No fever. Reports patient taking miralax  recently. Using rolator to walk.  Protocols used: Abdominal Pain - Male-A-AH

## 2024-06-27 NOTE — Telephone Encounter (Signed)
 Pt having entire abdominal pressure with bloating better since 2 constipated BMs last night;  no fever and no diarrhea and no N&V. ?if pt is passing gas., also having intermittent lt knee pain that Dr Cleatus is aware of and pt is taking abx now. Offered appt for pt on 06/30/24 but declined because pt only wants to see dr Cleatus. Scheduled appt with dr Cleatus on 06/29/24 [facility] 2[facility] and pts daughter voiced understanding. Sending note to Dr Cleatus and Cleatus  pool.

## 2024-06-28 NOTE — Telephone Encounter (Signed)
 Noted. Thanks.

## 2024-06-29 ENCOUNTER — Ambulatory Visit: Admitting: Family Medicine

## 2024-06-29 ENCOUNTER — Telehealth: Payer: Self-pay

## 2024-06-29 ENCOUNTER — Telehealth: Payer: Self-pay | Admitting: Family Medicine

## 2024-06-29 VITALS — BP 112/50 | HR 69 | Temp 97.7°F | Wt 183.2 lb

## 2024-06-29 DIAGNOSIS — R0602 Shortness of breath: Secondary | ICD-10-CM

## 2024-06-29 DIAGNOSIS — M545 Low back pain, unspecified: Secondary | ICD-10-CM

## 2024-06-29 DIAGNOSIS — I480 Paroxysmal atrial fibrillation: Secondary | ICD-10-CM

## 2024-06-29 MED ORDER — TORSEMIDE 10 MG PO TABS: 10.0000 mg | ORAL_TABLET | ORAL | 1 refills | Status: AC

## 2024-06-29 MED ORDER — IRON (FERROUS SULFATE) 325 (65 FE) MG PO TABS: ORAL_TABLET | ORAL | Status: AC

## 2024-06-29 MED ORDER — LANTUS SOLOSTAR 100 UNIT/ML ~~LOC~~ SOPN: 38.0000 [IU] | PEN_INJECTOR | Freq: Every day | SUBCUTANEOUS | Status: AC

## 2024-06-29 MED ORDER — CLOTRIMAZOLE-BETAMETHASONE 1-0.05 % EX CREA: TOPICAL_CREAM | Freq: Two times a day (BID) | CUTANEOUS | Status: AC | PRN

## 2024-06-29 MED ORDER — LIDOCAINE 4 % EX PTCH: 1.0000 | MEDICATED_PATCH | CUTANEOUS | Status: AC

## 2024-06-29 NOTE — Telephone Encounter (Signed)
 Please check to make sure labs are resulted.    Call patient at [phone] to get update on status on Friday.   Please update his son with labs at [phone].    Thanks.

## 2024-06-29 NOTE — Progress Notes (Signed)
 Abd sore for the last week or two.    He was taking iron  MWF but had black stools.  He stopped iron  recently.    Most recent torsemide  was two weeks ago.  No torsemide  use since.  No blood in urine.  He can get SOB with exertion.   He is in AF and has feeling of inc abd girth.    He still has L knee pain along with back pain, lower midline pain.    Fell (vs had trouble getting up) near [employer], family reported.  Patient denied a fall.  He has longstanding back pain.  This isn't new.  He had prev kyphoplasty.    Meds, vitals, and allergies reviewed.   ROS: Per HPI unless specifically indicated in ROS section   Nad Ncat Neck supple, no LA IRR, ectopy noted Ctab Abd not ttp focally but inc girth noted by patient.  B but L>R LE edema.  Skin well perfused.   Not jaundiced.  Lower back ttp in midline w/o rash.

## 2024-06-29 NOTE — Telephone Encounter (Signed)
 Spoke with patient's son in [place] , says his brother Signe is taking father to see Dr. Cleatus PCP today.  He will let brother know about patient being back in [facility] for his PCP visit today.   I will make Dr.Croitoru aware as well and see if anything further at this point.   Son states dad's color did not look that good this morning and he is feeling more pressure in his abdomen.

## 2024-06-29 NOTE — Telephone Encounter (Signed)
 See OV from today.  Thanks.

## 2024-06-29 NOTE — Assessment & Plan Note (Signed)
 Sounds to be in AF.  Will take a dose of torsemide  today with labs pending.  Will check with patient Friday AM re: status.   Likely with fluid retention though weight is lower.  At this point, okay for outpatient f/u with ER cautions d/w pt.  Cardio/renal considerations noted given CKD.    Note routed to cards re: status.  I thank all involved.

## 2024-06-29 NOTE — Patient Instructions (Signed)
 Labs today.  We'll call about your status tomorrow.   Take a dose of torsemide  today.  We'll make plans when I see your labs.  Try lidocaine  patches on your back.  Take care.  Glad to see you.

## 2024-06-29 NOTE — Telephone Encounter (Signed)
" °  Alert received from [employer] for Persistent AF, controlled rates, OAC contraindicated, burden 45-100% since 1/12.  Other notes in EPIC stated patient abdominal fullness,decreased appetite,increased fatigued,and shortness of breathe with ambulation.   Attempted to call patient, daughter and wife. No answer, LVM on all numbers available.   Has apt with Dr. Francyne 07/13/24.      "

## 2024-06-29 NOTE — Assessment & Plan Note (Signed)
 Limited options with CKD.  Can try otc 4% lidocaine  patch to see if that helps.  Defer imaging at this point.

## 2024-06-29 NOTE — Telephone Encounter (Signed)
 Left message and call back number.

## 2024-06-29 NOTE — Telephone Encounter (Signed)
 Copied from CRM [id number]. Topic: Clinical - Home Health Verbal Orders >> Jun 28, 2024  3:59 PM Burnard DEL wrote: Caller/Agency: [facility] Callback Number: [phone] Service Requested: Skilled nursing Frequency: 1wk4 Any new concerns about the patient? Yes Heather from [facility] went to see patient today,and she stated that she was going to discharge him today ,however he was not feeling well at all. She's not sure if its related to his kidney or  his heart failure.She stated that he was having abdominal fullness,decreased appetite,increased fatigued,and shortness of breathe with ambulation. Therefore she would  like to  follow him for 4 more weeks. Patient is scheduled to see provider tomorrow for OV.

## 2024-06-29 NOTE — Telephone Encounter (Signed)
 Tough situation since he is not on chronic anticoagulation. Sounds like he will need a cardioversion, but will need to be on anticoagulants for 3 weeks before and 4 weeks after the cardioversion. We can shorten the 3 weeks leading in to the procedure by doing a TEE, but cannot skimp on the back end. Recommend starting eliquis  2.5 mg twice daily (dose adjusted for age and kidney dysfunction) and scheduling an AFib clinic appointment to set up the cardioversion (+/- TEE depending on the urgency of his symptoms). I am not in the office until Feb 16, unfortunately.

## 2024-06-29 NOTE — Telephone Encounter (Signed)
 Spoke with [facility] RN at [facility]. Relayed message about verbal orders.

## 2024-06-29 NOTE — Telephone Encounter (Signed)
 Please give the order.  Thanks.

## 2024-06-30 ENCOUNTER — Ambulatory Visit: Payer: Self-pay | Admitting: Family Medicine

## 2024-06-30 LAB — CBC WITH DIFFERENTIAL/PLATELET
Basophils Absolute: 0.1 10*3/uL (ref 0.0–0.1)
Basophils Relative: 1 % (ref 0.0–3.0)
Eosinophils Absolute: 0.2 10*3/uL (ref 0.0–0.7)
Eosinophils Relative: 2.9 % (ref 0.0–5.0)
HCT: 33.3 % — ABNORMAL LOW (ref 39.0–52.0)
Hemoglobin: 10.8 g/dL — ABNORMAL LOW (ref 13.0–17.0)
Lymphocytes Relative: 19.6 % (ref 12.0–46.0)
Lymphs Abs: 1.4 10*3/uL (ref 0.7–4.0)
MCHC: 32.4 g/dL (ref 30.0–36.0)
MCV: 87.3 fl (ref 78.0–100.0)
Monocytes Absolute: 0.5 10*3/uL (ref 0.1–1.0)
Monocytes Relative: 7.4 % (ref 3.0–12.0)
Neutro Abs: 4.8 10*3/uL (ref 1.4–7.7)
Neutrophils Relative %: 69.1 % (ref 43.0–77.0)
Platelets: 153 10*3/uL (ref 150.0–400.0)
RBC: 3.81 Mil/uL — ABNORMAL LOW (ref 4.22–5.81)
RDW: 14.6 % (ref 11.5–15.5)
WBC: 6.9 10*3/uL (ref 4.0–10.5)

## 2024-06-30 LAB — COMPREHENSIVE METABOLIC PANEL WITH GFR
ALT: 31 U/L (ref 3–53)
AST: 34 U/L (ref 5–37)
Albumin: 3.5 g/dL (ref 3.5–5.2)
Alkaline Phosphatase: 436 U/L — ABNORMAL HIGH (ref 39–117)
BUN: 37 mg/dL — ABNORMAL HIGH (ref 6–23)
CO2: 25 meq/L (ref 19–32)
Calcium: 9.1 mg/dL (ref 8.4–10.5)
Chloride: 108 meq/L (ref 96–112)
Creatinine, Ser: 2.58 mg/dL — ABNORMAL HIGH (ref 0.40–1.50)
GFR: 20.55 mL/min — ABNORMAL LOW
Glucose, Bld: 163 mg/dL — ABNORMAL HIGH (ref 70–99)
Potassium: 5.2 meq/L — ABNORMAL HIGH (ref 3.5–5.1)
Sodium: 141 meq/L (ref 135–145)
Total Bilirubin: 0.5 mg/dL (ref 0.2–1.2)
Total Protein: 6.8 g/dL (ref 6.0–8.3)

## 2024-06-30 LAB — BRAIN NATRIURETIC PEPTIDE: Brain Natriuretic Peptide: 287 pg/mL — ABNORMAL HIGH

## 2024-06-30 NOTE — Telephone Encounter (Signed)
 Called patient son on dpr  reviewed all information and repeated back to me. Will call if any questions.

## 2024-06-30 NOTE — Telephone Encounter (Addendum)
 Called patient states he feels just a little better today than yesterday. No new symptoms. No lab results yet.

## 2024-06-30 NOTE — Telephone Encounter (Signed)
 Please update patient.  HGB some better, Cr some better.  Neither are normal but are reasonable for now.  Would repeat torsemide  today then use 3 times per week as needed.   Please update us  on Monday about status with a phone call.  Thanks.   Please update son also.  See below.

## 2024-07-04 ENCOUNTER — Ambulatory Visit (HOSPITAL_COMMUNITY): Admitting: Nurse Practitioner

## 2024-07-08 ENCOUNTER — Ambulatory Visit

## 2024-07-13 ENCOUNTER — Ambulatory Visit: Admitting: Cardiovascular Disease

## 2024-08-08 ENCOUNTER — Ambulatory Visit

## 2024-08-28 ENCOUNTER — Ambulatory Visit: Admitting: Family Medicine

## 2024-09-08 ENCOUNTER — Ambulatory Visit

## 2024-10-09 ENCOUNTER — Ambulatory Visit

## 2025-01-19 ENCOUNTER — Ambulatory Visit
